# Patient Record
Sex: Female | Born: 1950 | Race: White | Hispanic: No | Marital: Married | Smoking: Never smoker
Health system: Southern US, Community
[De-identification: ages and names within clinical notes are randomized; demographics above are authoritative.]

## PROBLEM LIST (undated history)

## (undated) DIAGNOSIS — F419 Anxiety disorder, unspecified: Secondary | ICD-10-CM

## (undated) DIAGNOSIS — I499 Cardiac arrhythmia, unspecified: Secondary | ICD-10-CM

## (undated) DIAGNOSIS — Z951 Presence of aortocoronary bypass graft: Secondary | ICD-10-CM

## (undated) DIAGNOSIS — N393 Stress incontinence (female) (male): Secondary | ICD-10-CM

## (undated) DIAGNOSIS — J189 Pneumonia, unspecified organism: Secondary | ICD-10-CM

## (undated) DIAGNOSIS — I10 Essential (primary) hypertension: Secondary | ICD-10-CM

## (undated) DIAGNOSIS — F329 Major depressive disorder, single episode, unspecified: Secondary | ICD-10-CM

## (undated) DIAGNOSIS — L0390 Cellulitis, unspecified: Secondary | ICD-10-CM

## (undated) DIAGNOSIS — E1151 Type 2 diabetes mellitus with diabetic peripheral angiopathy without gangrene: Secondary | ICD-10-CM

## (undated) DIAGNOSIS — I4891 Unspecified atrial fibrillation: Secondary | ICD-10-CM

## (undated) DIAGNOSIS — I509 Heart failure, unspecified: Secondary | ICD-10-CM

## (undated) DIAGNOSIS — T82897A Other specified complication of cardiac prosthetic devices, implants and grafts, initial encounter: Secondary | ICD-10-CM

## (undated) DIAGNOSIS — H2510 Age-related nuclear cataract, unspecified eye: Secondary | ICD-10-CM

## (undated) DIAGNOSIS — E11628 Type 2 diabetes mellitus with other skin complications: Secondary | ICD-10-CM

## (undated) DIAGNOSIS — E119 Type 2 diabetes mellitus without complications: Secondary | ICD-10-CM

## (undated) DIAGNOSIS — F32A Depression, unspecified: Secondary | ICD-10-CM

## (undated) DIAGNOSIS — I2510 Atherosclerotic heart disease of native coronary artery without angina pectoris: Secondary | ICD-10-CM

## (undated) DIAGNOSIS — I2699 Other pulmonary embolism without acute cor pulmonale: Secondary | ICD-10-CM

## (undated) DIAGNOSIS — M86679 Other chronic osteomyelitis, unspecified ankle and foot: Secondary | ICD-10-CM

## (undated) DIAGNOSIS — I209 Angina pectoris, unspecified: Secondary | ICD-10-CM

## (undated) DIAGNOSIS — S46009A Unspecified injury of muscle(s) and tendon(s) of the rotator cuff of unspecified shoulder, initial encounter: Secondary | ICD-10-CM

## (undated) DIAGNOSIS — L089 Local infection of the skin and subcutaneous tissue, unspecified: Secondary | ICD-10-CM

## (undated) DIAGNOSIS — T8859XA Other complications of anesthesia, initial encounter: Secondary | ICD-10-CM

## (undated) DIAGNOSIS — H2513 Age-related nuclear cataract, bilateral: Secondary | ICD-10-CM

## (undated) DIAGNOSIS — M1990 Unspecified osteoarthritis, unspecified site: Secondary | ICD-10-CM

## (undated) DIAGNOSIS — D649 Anemia, unspecified: Secondary | ICD-10-CM

## (undated) DIAGNOSIS — E785 Hyperlipidemia, unspecified: Secondary | ICD-10-CM

## (undated) DIAGNOSIS — T4145XA Adverse effect of unspecified anesthetic, initial encounter: Secondary | ICD-10-CM

## (undated) DIAGNOSIS — I739 Peripheral vascular disease, unspecified: Secondary | ICD-10-CM

## (undated) DIAGNOSIS — I5033 Acute on chronic diastolic (congestive) heart failure: Secondary | ICD-10-CM

## (undated) HISTORY — DX: Type 2 diabetes mellitus without complications: E11.9

## (undated) HISTORY — PX: FOOT FRACTURE SURGERY: SHX645

## (undated) HISTORY — DX: Atherosclerotic heart disease of native coronary artery without angina pectoris: I25.10

## (undated) HISTORY — PX: TONSILLECTOMY AND ADENOIDECTOMY: SUR1326

## (undated) HISTORY — DX: Essential (primary) hypertension: I10

## (undated) HISTORY — PX: APPENDECTOMY: SHX54

## (undated) HISTORY — DX: Hyperlipidemia, unspecified: E78.5

## (undated) HISTORY — PX: MANDIBLE FRACTURE SURGERY: SHX706

## (undated) HISTORY — DX: Peripheral vascular disease, unspecified: I73.9

## (undated) HISTORY — PX: OTHER SURGICAL HISTORY: SHX169

## (undated) HISTORY — DX: Acute on chronic diastolic (congestive) heart failure: I50.33

---

## 1997-08-09 HISTORY — PX: OTHER SURGICAL HISTORY: SHX169

## 1997-08-09 HISTORY — PX: WRIST FRACTURE SURGERY: SHX121

## 1998-10-30 ENCOUNTER — Other Ambulatory Visit: Admission: RE | Admit: 1998-10-30 | Discharge: 1998-10-30 | Payer: Self-pay | Admitting: *Deleted

## 1999-04-21 ENCOUNTER — Emergency Department (HOSPITAL_COMMUNITY): Admission: EM | Admit: 1999-04-21 | Discharge: 1999-04-21 | Payer: Self-pay

## 2001-03-28 ENCOUNTER — Ambulatory Visit (HOSPITAL_COMMUNITY): Admission: RE | Admit: 2001-03-28 | Discharge: 2001-03-29 | Payer: Self-pay | Admitting: Cardiology

## 2002-10-26 ENCOUNTER — Ambulatory Visit (HOSPITAL_COMMUNITY): Admission: RE | Admit: 2002-10-26 | Discharge: 2002-10-26 | Payer: Self-pay | Admitting: Cardiology

## 2004-12-30 ENCOUNTER — Ambulatory Visit: Payer: Self-pay

## 2005-01-23 ENCOUNTER — Emergency Department: Payer: Self-pay | Admitting: Emergency Medicine

## 2005-04-07 ENCOUNTER — Ambulatory Visit: Payer: Self-pay | Admitting: Internal Medicine

## 2005-05-04 ENCOUNTER — Ambulatory Visit (HOSPITAL_COMMUNITY): Admission: RE | Admit: 2005-05-04 | Discharge: 2005-05-05 | Payer: Self-pay | Admitting: Cardiology

## 2006-07-05 ENCOUNTER — Ambulatory Visit: Payer: Self-pay | Admitting: Internal Medicine

## 2007-01-04 ENCOUNTER — Emergency Department: Payer: Self-pay | Admitting: Emergency Medicine

## 2007-03-14 ENCOUNTER — Encounter: Payer: Self-pay | Admitting: Orthopaedic Surgery

## 2007-04-10 ENCOUNTER — Encounter: Payer: Self-pay | Admitting: Orthopaedic Surgery

## 2007-05-10 ENCOUNTER — Encounter: Payer: Self-pay | Admitting: Orthopaedic Surgery

## 2007-11-01 ENCOUNTER — Ambulatory Visit: Payer: Self-pay | Admitting: Cardiology

## 2007-11-30 ENCOUNTER — Ambulatory Visit: Payer: Self-pay | Admitting: Internal Medicine

## 2008-04-01 ENCOUNTER — Other Ambulatory Visit: Payer: Self-pay

## 2008-04-01 ENCOUNTER — Ambulatory Visit: Payer: Self-pay | Admitting: Orthopaedic Surgery

## 2008-04-02 ENCOUNTER — Ambulatory Visit: Payer: Self-pay | Admitting: Orthopaedic Surgery

## 2008-10-23 ENCOUNTER — Ambulatory Visit (HOSPITAL_COMMUNITY): Admission: RE | Admit: 2008-10-23 | Discharge: 2008-10-23 | Payer: Self-pay | Admitting: Cardiovascular Disease

## 2009-04-12 ENCOUNTER — Emergency Department: Payer: Self-pay | Admitting: Emergency Medicine

## 2009-04-30 ENCOUNTER — Ambulatory Visit: Payer: Self-pay | Admitting: Internal Medicine

## 2009-05-06 ENCOUNTER — Ambulatory Visit: Payer: Self-pay | Admitting: Physician Assistant

## 2009-06-13 ENCOUNTER — Ambulatory Visit: Payer: Self-pay | Admitting: Otolaryngology

## 2010-11-19 LAB — GLUCOSE, CAPILLARY: Glucose-Capillary: 163 mg/dL — ABNORMAL HIGH (ref 70–99)

## 2010-12-22 NOTE — Cardiovascular Report (Signed)
Dominique, [name]               ACCOUNT NO.:  [id number]   MEDICAL RECORD NO.:  [id number]          PATIENT TYPE:  OIB   LOCATION:  2899                         FACILITY:  [facility]   PHYSICIAN:  Antonieta Iba, MD   DATE OF BIRTH:  1951/03/05   DATE OF PROCEDURE:  10/23/2008  DATE OF DISCHARGE:  10/23/2008                            CARDIAC CATHETERIZATION   PHYSICIAN PERFORMING THE PROCEDURE:  Antonieta Iba, MD   REASON FOR PROCEDURE:  [name] is a very pleasant 60 year old woman  with a history of diabetes, coronary artery disease with stent placed in  her RCA in September 2006 with repeat catheterization in March 2009 by  Dr. Evette Georges recommending medical management of her moderate in-stent  restenosis who presents to the cardiac catheterization lab for repeat  catheterization given continued symptoms of chest pain, jaw pain, and  flushing.   Risks and benefits of the procedure were explained to the patient and  consent was obtained.  The patient was brought to the cardiac  catheterization lab, and prepped and draped in the usual sterile  fashion.  The modified Seldinger technique was used to engage the right  femoral artery and a 5-French Judkins left #4 and right #4 catheter were  used to engage the left main and the RCA respectively.  Hand injection  of contrast was used with cinematography to visualize the coronary  anatomy.  A 5-French pigtail catheter was used to cross the aortic valve  into the LV to obtain an LVgram.  At the end of the case, the catheters  were removed and the sheaths were removed, and hemostasis obtained by  manual pressure.  No complications were reported at the time of this  dictation.   CORONARY ANATOMY:  Left main:  The left main is a moderate-to-large size  vessel that is short and trifurcates into the LAD, ramus, and left  circumflex.  There was no significant disease noted.   Left anterior descending:  The LAD is a moderate-to-large  size vessel  that extends distally around the apex.  There are several small diagonal  branches.  There is a long tubular region of 50% disease in the proximal  region of the LAD.  There is also a moderate region of at least 50 to  possibly 60%, very distal LAD disease that is more focal.  Otherwise,  there is no other significant disease noted.   Left circumflex:  The left circumflex is a moderate-to-large size vessel  that has moderate ostial and proximal disease.  Otherwise, there is no  significant mid-to-distal disease.  There are 1-2 distal obtuse marginal  branches of mild-to-moderate size.   Ramus:  The ramus is a moderate-to-large size vessel that bifurcates in  its mid region.  There is mild 30 to possibly 40% proximal disease that  is long and tapering.   Right coronary artery:  The RCA is a dominant vessel that bifurcates  distally to the PDA and PL branch.  There is a stent placed proximally  extending to the mid region of the vessel.  There is mild-to-moderate in-  stent restenosis estimated of 40-50% at its most that is diffuse with no  focal regions of significant stenosis.  There is a region of mild-to-  moderate stenosis in the mid PD branch.   LVgram shows normal LV systolic function with no focal wall motion  abnormalities.  A pullback of the catheter across the aortic valve shows  no significant aortic gradient.   In summary, [name] has a right dominant coronary system with mild  to possibly moderate in-stent restenosis of the proximal to mid stent in  the RCA.  There is also mild-to-moderate disease noted in the proximal  LAD, proximal ramus, and ostial left circumflex.  60% disease noted in  the distal LAD.  No focal regions amenable to intervention.  We have  recommended medical management and we will try to increase her  nitroglycerin (Imdur), continue her on amlodipine.  We will also try to  add Ranexa as an outpatient to see if this will help her  symptoms of  chronic chest pain.  She certainly may have microvascular disease given  that she is a diabetic.  We will see her back in followup in the clinic  in 1-2 weeks for routine postoperative check.      Antonieta Iba, MD  Electronically Signed     TJG/MEDQ  D:  10/23/2008  T:  10/24/2008  Job:  [id number]   cc:   Dr. Aletha Halim

## 2010-12-25 NOTE — Discharge Summary (Signed)
[place]. [facility]  Patient:    [name], [name] Visit Number: [id number] MRN: [id number]          Service Type: CAT Location: 6500 [id number] Attending Physician:  Eleanora Neighbor Dictated by:   Jennet Maduro Earl Gala, R.N., A.N.P. Adm. Date:  03/28/2001 Disc. Date: 03/29/2001   CC:         Duane Lope. Judithann Sheen, M.D.  Darlin Priestly Lady Gary, M.D.   Discharge Summary  CHIEF COMPLAINT:  Recurrent chest pain despite medical therapy.  SECONDARY DISCHARGE DIAGNOSES: 1. Atherosclerotic cardiovascular disease with known totally occluded right    coronary artery with left to right collaterals with subsequent stent    placement to the right coronary artery with overall satisfactory results    obtained. 2. Obesity. 3. Diabetes. 4. Hypercholesterolemia.  HISTORY OF PRESENT ILLNESS:  [name] is a 60 year old white female who has had angina over the past four months.  She has had previous cardiac catheterization which showed a totally occluded right coronary artery.  She has continued to have chest discomfort despite a reasonable medical management.  Her sine angiograms were reviewed and it was felt that possible revascularization to the right coronary artery could be attempted.  She was subsequently admitted for repeat coronary angiography with possible percutaneous coronary intervention.  Please see the dictated history and physical for further patient presentation and profile.  LABORATORY DATA ON ADMISSION:  Chemistries were satisfactory, except for a glucose of 237 and alkaline phosphatase slightly elevated at 118.  The CBC was normal.  The cholesterol level showed triglycerides of 494.  The total cholesterol was 204.  The PT and PTT were unremarkable.  HOSPITAL COURSE:  The patient was admitted from the short stay in order to undergo elective coronary angiography.  The overall procedure was tolerated well without any known complications.  LV function  demonstrated mild inferior anterior hypokinesis with ejection fraction of 45-50%.  The right coronary was 100% occluded with slight antegrade flow.  A stent was placed to the mid right coronary, as well as to the proximal right coronary with overall satisfactory results obtained.  The left main is normal.  The left circumflex does have minor irregularities and there a scattered 30% irregularities in the intermediate and 30-40% diffuse narrowing in the left anterior descending. Post procedure she was given IV Integrilin and was subsequently transferred to 6500 for further monitoring and evaluation.  Today on March 29, 2001, she is doing well.  She has had no recurrence of chest pain.  The groin has remained unremarkable.  She is felt to be a stable candidate for discharge today.  DISCHARGE CONDITION:  Improved.  DISCHARGE MEDICATIONS:  She will resume all of her home medicines as she was taking before.  Will add Plavix 75 mg a day for the next 21 days.  ACTIVITY:  To be light over the next several days.  DIET:  Low-fat diabetic.  WOUND CARE:  She is to place an ice pack to the groin if needed.  FOLLOW-UP:  Will plan on seeing her back in the office on Tuesday, April 11, 2001, at 10:15 a.m. or sooner if problems arise. Dictated by:   Jennet Maduro Earl Gala, R.N., A.N.P. Attending Physician:  Eleanora Neighbor DD:  03/29/01 TD:  03/29/01 Job: [id number]

## 2010-12-25 NOTE — Cardiovascular Report (Signed)
[place]. [facility]  Patient:    [name], [name] Visit Number: [id number] MRN: [id number]          Service Type: CAT Location: 6500 6529 01 Attending Physician:  Eleanora Neighbor Proc. Date: 03/28/01 Adm. Date:  03/28/2001   CC:         Harold Hedge, M.D., [facility]  Aletha Halim, M.D., [place]   Cardiac Catheterization  REFERRING PHYSICIAN:  Harold Hedge, M.D., [facility] and Dr. Aletha Halim in [place].  INDICATIONS:  The patient is a 60 year old female, with a history of angina and catheterization in June of 2002.  She had totally occluded right coronary artery.  She had left to right collaterals.  Plans were made to follow her medically, but she continued to have symptoms that were unsatisfactory.  Cine were reviewed and she was referred for attempted angioplasty of the right coronary artery.  PROCEDURE:  Left heart catheterization with selective coronary angiography, left ventricular angiography, and angioplasty with stents placed to the right coronary artery sequentially.  TYPE AND SITE OF ENTRY:  Percutaneous right femoral artery.  CATHETERS:  The 6 French 4 curved Judkins right and left coronary catheters, 6 French pigtail ventriculographic catheter, 6 [place] JR4 with side holes, Hi-Torque Floppy guide wire, a 1.5 x 20 mm Maverick balloon, a 2.5 x 20 mm Maverick balloon, a 2.5 x 23 mm Pixel stent, a 2.5 x 13 mm Pixel stent (proximal).  CONTRAST MATERIAL:  Omnipaque.  MEDICATIONS GIVEN PRIOR TO THE PROCEDURE:  Valium 10 mg p.o.  MEDICATIONS GIVEN DURING THE PROCEDURE:  Versed 4 mg IV, Integrilin, heparin.  COMMENTS:  The patient tolerated the procedure well.  HEMODYNAMIC DATA:  The aortic pressure was 169/83,  LV is 162/23.  There was no aortic valve gradient noted on pullback.  ANGIOGRAPHIC DATA: 1. Left main coronary artery:  Normal. 2. Left circumflex:  The left circumflex is moderate in size, posterolateral  branch.  It has irregularities with no significant focal disease. 3. Intermediate:  The intermediate rises and is a moderate sized vessel.  It    has 30-40% somewhat diffuse narrowings proximally. 4. Left anterior descending:  The left anterior descending is moderately    large vessel.  It has irregularities of 30-40% nature.  It has no    significant obstructive disease. 5. Right coronary artery:  The right coronary artery has a 99+ percent    stenosis and the midportion is functionally occluded.  There are left to    right collateral distally.  It is somewhat diffusely diseased proximally.  LEFT VENTRICULOGRAPHY:  Left ventricular angiogram was performed in the RAO position.  Overall cardiac size and silhouette were normal.  There was mild inferior hypokinesis as well as mild anterior hypokinesis with global ejection fraction estimated to be 45-50%.  There is no mitral regurgitation, intracardiac calcification or intracavitary filling defect.  INTERVENTION:  We changed guide catheters to a JR4 guide with side holes.  It was a 6 Jamaica catheter that supplied adequate backup.  Initially we used a Hi-Torque Floppy guide wire but did not really have enough stiffness to cross the lesions.  We then returned with a Cross-It wire and were able to cannulate distally but were in a branch.  We used a 1.5 Maverick balloon dilated proximally then returned and were able to pass the guide wire into the distal vessel.  We then returned with a 2.5 Maverick balloon; it was 20 mm in length, and dilated across what had been the total  occlusion.  Next, we placed the 23 x 2.5 mm Pixel stent.  This was inflated to a maximum of 12 atmospheres. The placement was satisfactory.  We then returned with a 2.5 x 13 mm Pixel stent.  This was positioned proximally across what appeared to be a 70% narrowing.  We left approximately 10 mm of relatively normal vessel between the stents.  The proximal stent was inflated to  14 atmospheres maximally.  The final angiographic result was excellent and there really was no residual stenosis present and excellent distal flow.  OVERALL IMPRESSION: 1. Essentially totally occluded right coronary artery with left to right    collaterals with successful stent placement in the mid right coronary    artery and proximal right coronary artery. 2. Mildly reduced left ventricular function. 3. Mild coronary atherosclerosis in the left system.  DISCUSSION:  We will try aggressively to have the patient manage her cardiovascular risk factors.  She certainly would be at increased risk for re-stenosis with the small vessels and the length of the stents, but hopefully this can be managed and we can avoid surgery at her young age, and her station in life. Attending Physician:  Eleanora Neighbor DD:  03/28/01 TD:  03/28/01 Job: [id number]

## 2010-12-25 NOTE — Discharge Summary (Signed)
[name], [name]               ACCOUNT NO.:  [id number]   MEDICAL RECORD NO.:  [id number]          PATIENT TYPE:  OIB   LOCATION:  6525                         FACILITY:  [facility]   PHYSICIAN:  Colleen Can. Deborah Chalk, M.D.DATE OF BIRTH:  Dec 21, 1950   DATE OF ADMISSION:  05/04/2005  DATE OF DISCHARGE:                                 DISCHARGE SUMMARY   CHIEF COMPLAINT:  Chest pain with subsequent elective cardiac  catheterization with angioplasty and stent placement to the right coronary  artery.   CATHETERIZATION FINDINGS:  LV function shows mild inferior hypokinesia.  Ejection fraction is 50-60%.  The left main coronary is normal. The LAD has  segmental 50% narrowing.  The intermediate has segmental 50% narrowing.  There are scattered 40-50% narrowing in the left circumflex.  The right  coronary artery at the previously stented segmental section shows a 95%  focal lesion after the acute margin.  Subsequent angioplasty and stent  placement was performed with a 2.5 x 13 mm CYPHER stent with a 0% residual  result obtained.  There was slow flow in the right coronary with resolution  with IV nitroglycerin, Fentanyl and Lopressor for chest pain was given.   SECONDARY DISCHARGE DIAGNOSES:  1.  Extensive atherosclerotic cardiovascular disease, previous stent      placements x2 to the right coronary in August of 2002.  2.  Uncontrolled insulin-dependent diabetes, currently with hemoglobin A1c      of 11.2.  3.  Hypertension currently uncontrolled with recent addition of Micardis.  4.  ACE intolerance secondary to cough.  5.  Hypothyroidism.  6.  Hyperlipidemia.  7.  Obesity.  8.  Longstanding situational stress.   HISTORY OF PRESENT ILLNESS:  The patient is a pleasant, 60 year old obese  white female.  She has multiple medical problems which include uncontrolled  insulin-dependent diabetes, hyperlipidemia and uncontrolled hypertension; as  well as known extensive ishemic heart disease.   She presents after having  recurrence of chest pain.  She has had a previous Cardiolite study in  [place] which showed no evidence of ischemia.  She has continued to use  more nitroglycerin over the past several weeks and subsequently was seen in  our office and referred for repeat catheterization.   Please see the dictated history and physical for further patient  presentation and profile.   LABORATORY DATA:  On admission PT and PTT were unremarkable.  CBC was normal  except for a white count of 11.6.  Chemistries showed a BUN of 16,  creatinine 0.8, glucose 431, sodium 138, potassium 4.4.  Her alkaline  phosphatase is elevated at 201.  LFTs are normal.  Cholesterol level shows  total cholesterol 187, triglycerides 142, HDL 50 and LDL of 109, hemoglobin  A1c is 11.2.   HOSPITAL COURSE:  The patient was admitted electively in order to undergo  cardiac catheterization.  That procedure was basically tolerated well.  She  did have chest pain that was treated with IV nitroglycerin, Fentanyl and  Lopressor.  Angioplasty and stent placement was performed to the right  coronary and overall a very satisfactory result  was obtained.  Post  procedure she was transferred to 6500 and today, on 05/05/2005, she is doing  well without complaints.  She has had no further episodes of chest pain.  Her groin has remained stable, and she is felt to be a stable candidate for  discharge today.  Cardiac enzymes are negative x2.   DISCHARGE CONDITION:  Stable.   DISCHARGE MEDICINES:  1.  Imdur 30 mg a day.  2.  Lasix 20 mg p.r.n.  3.  A baby aspirin a day.  4.  Lipitor 10 mg a day.  5.  Atenolol 100 mg a day.  6.  Lantus insulin 40 units at bedtime.  7.  Micardis 40 mg a day.  These are all as she was taking before.  1.  We will be adding Plavix 75 mg daily indefinitely.   We will plan on seeing her back in the office in 1-2 weeks.  She is asked to  call to schedule that appointment we will have  her see Dr. Judithann Sheen for  management of her uncontrolled diabetes this week.  She is to call if any  problems arise in the interim.      Sharlee Blew, N.P.      Colleen Can. Deborah Chalk, M.D.  Electronically Signed    LC/MEDQ  D:  05/05/2005  T:  05/05/2005  Job:  [id number]   cc:   Aletha Halim, M.D.
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
## 2010-12-25 NOTE — Cardiovascular Report (Signed)
[name], [name]               ACCOUNT NO.:  [id number]   MEDICAL RECORD NO.:  [id number]          PATIENT TYPE:  OIB   LOCATION:  2874                         FACILITY:  [facility]   PHYSICIAN:  Colleen Can. Deborah Chalk, M.D.DATE OF BIRTH:  1950-12-24   DATE OF PROCEDURE:  05/04/2005  DATE OF DISCHARGE:                              CARDIAC CATHETERIZATION   REASON FOR CATHETERIZATION:  [name] is referred for catheterization for  evaluation of progressive angina. She has had previous stents to the right  coronary artery. She has poorly controlled diabetes mellitus.   PROCEDURE:  Left heart catheterization with selective coronary angiography,  left ventricular angiography, and stent placement in the right coronary  artery.   TYPE/SITE OF ENTRY:  Percutaneous right femoral artery with Angio-Seal.   CATHETERS:  1.  A #6 French 4 curved Judkins right and left coronary catheters.  2.  A #6 French pigtail ventricular ___________ catheter.  3.  Angioplasty was performed with a 6 [place] JR-4 guide with side holes, a      probe water guidewire, a 2.5 x 8 mm Voyager balloon, and subsequently, a      2.5 x 13 mm Cypher stent.   CONTRAST MATERIAL:  Omnipaque.   MEDICATIONS DURING PROCEDURE:  Initially the patient received Versed. As  anticoagulation for the angioplasty, we used heparin and Integralin. He had  intravenous nitroglycerin, all during the elective part of the procedure.  After the angioplasty, she initially had spasm of the right coronary artery  and we used intracoronary nitroglycerin on one occasion with complete  resolution of the spasm. At that point in time, we increased intravenous  nitroglycerin to a maximum of 100 mcg per minute. Also given were Lopressor  5 mg IV x2, Fentanyl 100 mcg IV, and Phenergan 12.5 mg IV. At the time of  leaving the catheterization lab, the chest pain has basically resolved and  was listed by the patient as a 2 over 10.   HEMODYNAMIC DATA:   Aortic pressure was 145/89. Left ventricular was 157/3-4.  There was no aortic valve gradient on pull-back.   ANGIOGRAPHIC DATA:  1.  Left main coronary artery was normal.  2.  Left circumflex:  The left circumflex was a small to moderate size      vessel that continues primarily as an obtuse marginal. There is somewhat      diffuse disease that would be segmentally noted to be 50% and had      luminal narrowing.  3.  Intermediate coronary:  There is an intermediate coronary artery with      segmental 40% to 50% narrowing.  4.  Left anterior descending:  The left anterior descending is a moderately      large vessel. There is 50% segmental left anterior descending stenosis      present.  5.  Right coronary artery:  The right coronary artery is a dominant vessel.      Stents are present in the proximal right coronary artery in a somewhat      diffuse manner but with persistent patency of the vessel. Just  beyond      the acute margin, but prior to the crux, there is a 90% focal stenosis      present. Posterior descending at this level has 60% ostial narrowing.      The continuation branch has irregularities but is free of obstructive      disease.  6.  The left ventricular angiogram is performed in the RAO position. The      overall cardiac size and silhouette were normal. There is very minimal      inferior hypokinesis but the ejection fraction is felt to be in the 55%      to 60% range.   ANGIOPLASTY PROCEDURE:  We initially used the JL-4 guide with side holes.  The probe water guidewire was passed easily across the stenosis. We pre-  dilated with a 2.5 Voyager balloon. We then placed a 2.5 x 13 mm Cipher  stent across the stenosis. Satisfactory inflations were obtained and  excellent position of the stent as well as normalization of the luminal  diameter. This stent was inflated to a maximum of 16 atmospheres. After the  stents were moved and at the time we were beginning to do  Angio-Seal, the  patient developed substernal chest pain. She did have slight ST elevation.  Angiogram showed slow flow in the entire right coronary artery. The main  body of the right coronary artery had adequate flow in the continuation  branch but posterior descending vessel and proximal acute marginal had slow  flow. At that point, intra-coronary nitroglycerin was given and the followup  shot, at that point in time, showed excellent flow throughout. We had  persistence of excellent flow throughout the vessel for the remainder of the  time in the catheterization lab. The patient then developed substernal chest  pain without EKG changes. Angiograms were taken in multiple views and failed  to show any severe obstruction, although she had persistence of the 60% to  70% stenosis at the ostium of the posterior descending. She was watched for  approximately 30 minutes and given multiple agents as listed above and with  that, had resolution of pain, persistence of patency, and it is felt that it  was a satisfactory, excellent angioplasty result.   OVERALL IMPRESSION:  1.  Moderately diffuse coronary atherosclerosis involving all 3 vessels with      a severe focal stenosis in the right coronary artery.  2.  Severe stenosis in the distal right coronary artery, prior to the crux,      with successful stent placement.  3.  Well preserved global left ventricular function.      Colleen Can. Deborah Chalk, M.D.  Electronically Signed     SNT/MEDQ  D:  05/04/2005  T:  05/05/2005  Job:  [id number]   cc:   Dr. Judithann Sheen

## 2010-12-25 NOTE — Cardiovascular Report (Signed)
[name], [name]                           ACCOUNT NO.:  [id number]   MEDICAL RECORD NO.:  [id number]                   PATIENT TYPE:  OIB   LOCATION:  2899                                 FACILITY:  [facility]   PHYSICIAN:  Colleen Can. Deborah Chalk, M.D.            DATE OF BIRTH:  Jul 06, 1951   DATE OF PROCEDURE:  10/26/2002  DATE OF DISCHARGE:                              CARDIAC CATHETERIZATION   HISTORY:  The patient is a 60 year old female with previous stents to the  right coronary artery in 2002.  She presents with recurrent angina, but in  the setting of extreme emotional stress.  Her other problems include  diabetes, hypercholesterolemia and obesity.   PROCEDURE:  Left heart catheterization with selective coronary angiography,  with left ventricular angiography with Perclose.   TYPE AND SITE OF ENTRY:  Percutaneous; right femoral artery.   CATHETER:  The 6-French four-curved Judkins right and left coronary  catheters, 6-French pigtail ventriculography catheter.   CONTRAST MATERIAL:  Omnipaque.   MEDICATIONS GIVEN PRIOR TO PROCEDURE:  Valium 10 mg p.o.   MEDICATIONS GIVEN DURING PROCEDURE:  Versed 3 mg IV.   COMMENTS:  Patient tolerated the procedure well.   HEMODYNAMIC DATA:  The aortic pressure was 130/74.  LV was 136/22.  There  was no aortic valve gradient noted on pullback.   ANGIOGRAPHIC DATA:  Left ventricular angiogram was performed in the RAO  position.  Overall cardiac size and silhouette were normal.  There was very  mild inferobasilar hypokinesis with a global ejection fraction of 55%.  There was no mitral regurgitation, intracardiac calcification or  intracavitary filling defect.   CORONARY ARTERIES:  The coronary arteries arise and distribute normally.  In  general, there are diffuse irregularities throughout the coronary tree.   1. Left main coronary artery is normal.  2. Left circumflex:  The left circumflex has diffuse irregularities.  There     is a  30-40% narrowing distally that may be somewhat more prominent than     the other irregularities.  3. Intermediate coronary artery has diffuse irregularities without focal     obstructive disease.  4. Left anterior descending:  The left anterior descending has diffuse     atherosclerotic.  Approximately 2 cm prior to crossing the apex, there is     a 90% somewhat focal stenosis.  It is a tortuous segment and does not     really lend itself to angioplasty and it is quite small, in the 2-mm     range, at this distal portion at the vessel.  5. Right coronary artery:  The right coronary artery has stents present.     They are widely patent.  There are diffuse irregularities throughout the     right coronary artery without significant focal obstructive disease.    OVERALL IMPRESSION:  1. Well-preserved left ventricular function with mild inferobasilar     hypokinesis.  2.  Diffuse irregularities throughout the coronary tree with a focal 90%     narrowing in the distal left anterior descending just as it crosses the     apex in a very terminal location.   DISCUSSION:  I think we need to try to manage the patient medically.  Clearly, the stents are patent and she has dramatically more flow in her  coronary anatomy than she did in 2002, but we will need to continue to work  on modifying cardiovascular risk factors.                                                Colleen Can. Deborah Chalk, M.D.    SNT/MEDQ  D:  10/26/2002  T:  10/27/2002  Job:  [id number]   cc:   [place], [place], [place].D.

## 2011-01-13 ENCOUNTER — Ambulatory Visit: Payer: Medicare Other | Admitting: Internal Medicine

## 2011-01-14 ENCOUNTER — Other Ambulatory Visit: Payer: Self-pay | Admitting: Internal Medicine

## 2011-01-14 DIAGNOSIS — Z712 Person consulting for explanation of examination or test findings: Secondary | ICD-10-CM

## 2011-01-19 ENCOUNTER — Ambulatory Visit
Admission: RE | Admit: 2011-01-19 | Discharge: 2011-01-19 | Disposition: A | Discharge status: Home / Self Care | Payer: Medicare Other | Source: Ambulatory Visit | Attending: Family Medicine | Admitting: Family Medicine

## 2011-01-19 ENCOUNTER — Other Ambulatory Visit: Payer: Self-pay | Admitting: Family Medicine

## 2011-01-19 ENCOUNTER — Other Ambulatory Visit: Payer: Self-pay | Admitting: Internal Medicine

## 2011-01-19 DIAGNOSIS — Z712 Person consulting for explanation of examination or test findings: Secondary | ICD-10-CM

## 2011-01-19 DIAGNOSIS — R928 Other abnormal and inconclusive findings on diagnostic imaging of breast: Secondary | ICD-10-CM

## 2011-01-20 ENCOUNTER — Ambulatory Visit
Admission: RE | Admit: 2011-01-20 | Discharge: 2011-01-20 | Disposition: A | Discharge status: Home / Self Care | Payer: Medicare Other | Source: Ambulatory Visit | Attending: Family Medicine | Admitting: Family Medicine

## 2011-01-20 ENCOUNTER — Other Ambulatory Visit: Payer: Self-pay | Admitting: Internal Medicine

## 2011-01-20 ENCOUNTER — Other Ambulatory Visit: Payer: Self-pay | Admitting: Family Medicine

## 2011-01-20 DIAGNOSIS — R928 Other abnormal and inconclusive findings on diagnostic imaging of breast: Secondary | ICD-10-CM

## 2011-02-23 ENCOUNTER — Telehealth: Payer: Self-pay | Admitting: Cardiovascular Disease

## 2011-02-23 ENCOUNTER — Ambulatory Visit: Payer: Medicare Other | Admitting: Anesthesiology

## 2011-02-23 NOTE — Telephone Encounter (Signed)
error 

## 2011-02-24 ENCOUNTER — Encounter: Payer: Self-pay | Admitting: *Deleted

## 2011-02-24 ENCOUNTER — Encounter: Payer: Self-pay | Admitting: Cardiovascular Disease

## 2011-02-24 ENCOUNTER — Ambulatory Visit (INDEPENDENT_AMBULATORY_CARE_PROVIDER_SITE_OTHER): Payer: Medicare Other | Admitting: Cardiovascular Disease

## 2011-02-24 DIAGNOSIS — Z01818 Encounter for other preprocedural examination: Secondary | ICD-10-CM

## 2011-02-24 DIAGNOSIS — E119 Type 2 diabetes mellitus without complications: Secondary | ICD-10-CM | POA: Insufficient documentation

## 2011-02-24 DIAGNOSIS — I2510 Atherosclerotic heart disease of native coronary artery without angina pectoris: Secondary | ICD-10-CM | POA: Insufficient documentation

## 2011-02-24 NOTE — Patient Instructions (Signed)
You are doing well. No medication changes were made. Please call us if you have new issues that need to be addressed.  Please follow up in clinic as needed.

## 2011-02-24 NOTE — Progress Notes (Signed)
[name] Date of Birth  01-Jan-1951 [facility] / [facility] [address]     [address] [place], [place]  [address] [phone]  Fax  [phone]  History of Present Illness:  Pt has a hx of diabetes, HTN, and hyperlipidemia. Hx of A-Fib in the past.  Took Fen-Phen in the past.  Walks 1-3 miles a day.  Watching fat intake.  Hx of coronary stents 10 years ago.    Lots of stress with family issues.  No chest pain or dyspnea.    Current Outpatient Prescriptions  Medication Sig Dispense Refill  . amLODipine (NORVASC) 10 MG tablet Take 10 mg by mouth daily.        Marland Kitchen aspirin 325 MG tablet Take 325 mg by mouth daily.        Marland Kitchen atenolol (TENORMIN) 100 MG tablet Take 100 mg by mouth daily.        Marland Kitchen atorvastatin (LIPITOR) 10 MG tablet Take 10 mg by mouth daily.        Marland Kitchen buPROPion (WELLBUTRIN XL) 300 MG 24 hr tablet Take 300 mg by mouth daily.        . clopidogrel (PLAVIX) 75 MG tablet Take 75 mg by mouth daily.        . furosemide (LASIX) 20 MG tablet Take 20 mg by mouth daily.        . isosorbide mononitrate (IMDUR) 60 MG 24 hr tablet Take 60 mg by mouth daily.        . sitaGLIPtin (JANUVIA) 100 MG tablet Take 100 mg by mouth daily.        Marland Kitchen zolpidem (AMBIEN) 10 MG tablet Take 10 mg by mouth at bedtime as needed.           Allergies  Allergen Reactions  . Codeine   . Oxycontin     Palpitations with difficulty breathing.    Past Medical History  Diagnosis Date  . Diabetes mellitus   . Hyperlipidemia   . Hypertension     Past Surgical History  Procedure Date  . Tonsillectomy and adenoidectomy   . Cesarean section     History  Smoking status  . Never Smoker   Smokeless tobacco  . Not on file    History  Alcohol Use No    Family History  Problem Relation Age of Onset  . Hypertension Mother     Reviw of Systems:  Reviewed in the HPI.  All other systems are negative.  Physical Exam: BP 132/75  Pulse 81  Ht 5\' 8"  (1.727 m)  Wt  190 lb (86.183 kg)  BMI 28.89 kg/m2 The patient is alert and oriented x 3.  The mood and affect are normal.   Skin: warm and dry.  Color is normal.    HEENT:   the sclera are nonicteric.  The mucous membranes are moist.  The carotids are 2+ without bruits.  There is no thyromegaly.  There is no JVD.    Lungs: clear.  The chest wall is non tender.    Heart: regular rate with a normal S1 and S2.  There are no murmurs, gallops, or rubs. The PMI is not displaced.     Abdomin: good bowel sounds.  There is no guarding or rebound.  There is no hepatosplenomegaly or tenderness.  There are no masses.   Extremities:  no clubbing, cyanosis, or edema.  The legs are without rashes.  The distal pulses are intact.   Neuro:  Cranial nerves II - XII are intact.  Motor and sensory functions are intact.    The gait is normal.  ECG: NSR.  Normal ECG.  Assessment / Plan:

## 2011-02-24 NOTE — Assessment & Plan Note (Signed)
She's started to watch her intake of carbohydrates.

## 2011-02-24 NOTE — Assessment & Plan Note (Addendum)
She is stable from a cardiac standpoint.   She has not had any episodes of chest pain or shortness of breath. She is a history of coronary stenting. It will be okay for a hold her Plavix for up to 7 days. I would like her to continue the aspirin. She is at low risk for any cardiovascular complication.

## 2011-02-24 NOTE — Assessment & Plan Note (Signed)
She is very stable. He'll follow with Dr. Mariah Milling at her regularly scheduled appointment

## 2011-04-02 ENCOUNTER — Ambulatory Visit: Payer: Medicare Other | Admitting: Podiatry

## 2011-04-05 LAB — PATHOLOGY REPORT

## 2011-04-09 ENCOUNTER — Inpatient Hospital Stay: Payer: Medicare Other | Admitting: Podiatry

## 2011-04-21 ENCOUNTER — Inpatient Hospital Stay: Payer: Medicare Other | Admitting: Podiatry

## 2011-04-26 LAB — PATHOLOGY REPORT

## 2011-06-14 ENCOUNTER — Ambulatory Visit: Payer: Medicare Other | Admitting: Rheumatology

## 2011-08-10 HISTORY — PX: CORONARY ANGIOPLASTY WITH STENT PLACEMENT: SHX49

## 2011-08-10 HISTORY — PX: OTHER SURGICAL HISTORY: SHX169

## 2011-08-16 ENCOUNTER — Other Ambulatory Visit: Payer: Self-pay | Admitting: Internal Medicine

## 2011-08-16 DIAGNOSIS — R922 Inconclusive mammogram: Secondary | ICD-10-CM

## 2011-08-18 ENCOUNTER — Ambulatory Visit: Payer: Self-pay | Admitting: Otolaryngology

## 2011-08-24 ENCOUNTER — Ambulatory Visit
Admission: RE | Admit: 2011-08-24 | Discharge: 2011-08-24 | Disposition: A | Discharge status: Home / Self Care | Payer: Medicare Other | Source: Ambulatory Visit | Attending: *Deleted | Admitting: *Deleted

## 2011-08-24 DIAGNOSIS — R922 Inconclusive mammogram: Secondary | ICD-10-CM

## 2011-11-30 ENCOUNTER — Other Ambulatory Visit: Payer: Self-pay | Admitting: Sports Medicine

## 2011-11-30 DIAGNOSIS — IMO0002 Reserved for concepts with insufficient information to code with codable children: Secondary | ICD-10-CM

## 2011-12-01 ENCOUNTER — Other Ambulatory Visit: Payer: Self-pay | Admitting: Sports Medicine

## 2011-12-01 DIAGNOSIS — IMO0002 Reserved for concepts with insufficient information to code with codable children: Secondary | ICD-10-CM

## 2011-12-03 ENCOUNTER — Ambulatory Visit
Admission: RE | Admit: 2011-12-03 | Discharge: 2011-12-03 | Disposition: A | Discharge status: Home / Self Care | Payer: Medicare Other | Source: Ambulatory Visit | Attending: Sports Medicine | Admitting: Sports Medicine

## 2011-12-03 DIAGNOSIS — IMO0002 Reserved for concepts with insufficient information to code with codable children: Secondary | ICD-10-CM

## 2011-12-03 NOTE — Progress Notes (Signed)
Patient ID: [name], female   DOB: 06-09-1951, 61 y.o.   MRN: [id number] Office/Outpatient New Patient - Level II 40981 12/03/2011 15:39:00  Referring physician: Dr. Farris Has  Reason for Consult/Chief Complaint: Low back pain. L3 compression fracture.  History of Present Illness: [name] is a very pleasant 61 year old woman who sustained a fall 317/2013. She has had severe low back pain since that time. The pain is localized to the low back without significant radiation into the extremities. She states the pain is slightly better when she is in a flexed position. She denies any bowel or bladder symptoms. She rates the pain has 10/10. A Roland-Morris disability questionnaire was administered. She scored 15/24. She states the pain has been consistent since the initial trauma. It is not improving or getting worse. She does have some relief from the pain with Norco 10/325. She is also using diclofenac. She would like to reduce her need for pain medication.  Medications: 1. Norco 10/325, 1 every 6-8 hours. 2. Diclofenac, 1-2 every 4-6 hours. 3. Plavix 75 mg, once daily 4. Atenolol 100 mg, once daily 5. Isosorbide 30 mg, once daily 6. Lasix 20 mg as needed. 7. Insulin 20 +24 mg subcutaneous once daily 8. Crestor or 20 mg once daily  Allergies: Biaxin and Actos  Past Medical History: 1. Coronary artery disease status post multiple coronary stents. 2. Hypertension. 3. Hypercholesterolemia. 4. Diabetes. 5. Hypothyroidism.  Surgical History: 1. Coronary artery stents 2. Right rotator cuff repair. 3. Multiple left foot surgeries. 4. Mandible surgeries. 5. C-sections  Social History: The patient denies use of tobacco or alcohol. She is married and lives with her husband and two daughters.  Review of Systems: The patient states she has had some weight loss. She denies any cardiovascular, respiratory, gastrointestinal, musculoskeletal, skin, neurologic, psychiatric, or  other endocrine symptoms.  Exam:  Vitals: Blood pressure 164/76. Pulse 59. Temperature 98.2 degrees.  Data Review: I have reviewed the patient's MRI from [facility]. This demonstrates 08/02: Compression fracture at L3 with significant retropulsion of bone. Moderate to severe central canal stenosis results. The left side of the canal is narrowed to 2 mm. Foraminal narrowing is greater right than left.  IMPRESSIONS:  L3 compression fracture with moderate to severe central canal stenosis with particular narrowing of the left side of the canal. The compression fracture is symptomatic with unrelenting pain that is not improving. The patient has some relief with pain medications.  PLAN:  Due to the significant compression of the spinal canal, I am not comfortable performing vertebroplasty or kyphoplasty this patient. The internal pressure generated within the vertebral body could result in slight retropulsion of the existing bone. Given the minimal margin for error and potential consequences of new leg pain which may be severe, weakness, loss of sensation, or paralysis, the risks outweigh the benefits of spinal augmentation for this patient.  If the patient can tolerate external bracing, allowing the fracture to heal completely without intervention would be ideal.  I have advised the patient to seek surgical consultation should she develop any lower extremity symptoms or changes in bowel or bladder habits that not attributable to medications. Should her symptoms progress or not heal, surgical intervention may be necessary.  I answered all questions. I spent greater than 20 minutes of time with the patient and her husband.  Per CMS PQRS reporting requirements (PQRS Measure 24): Given the patient's age of greater than 50 and the fracture site (hip, distal radius, or spine), the patient  should be tested for osteoporosis using DXA, and the appropriate treatment considered based  on the DXA results.

## 2012-04-26 ENCOUNTER — Ambulatory Visit: Payer: Medicare Other | Admitting: Cardiovascular Disease

## 2012-05-11 ENCOUNTER — Encounter: Payer: Self-pay | Admitting: Cardiovascular Disease

## 2012-07-04 ENCOUNTER — Other Ambulatory Visit: Payer: Self-pay | Admitting: Internal Medicine

## 2012-07-04 DIAGNOSIS — Z1231 Encounter for screening mammogram for malignant neoplasm of breast: Secondary | ICD-10-CM

## 2012-08-14 ENCOUNTER — Ambulatory Visit
Admission: RE | Admit: 2012-08-14 | Discharge: 2012-08-14 | Disposition: A | Discharge status: Home / Self Care | Payer: Medicare Other | Source: Ambulatory Visit | Attending: Internal Medicine | Admitting: Internal Medicine

## 2012-08-14 DIAGNOSIS — Z1231 Encounter for screening mammogram for malignant neoplasm of breast: Secondary | ICD-10-CM

## 2012-11-27 ENCOUNTER — Encounter: Payer: Self-pay | Admitting: Pulmonary Disease

## 2012-11-28 ENCOUNTER — Institutional Professional Consult (permissible substitution): Payer: Medicare Other | Admitting: Pulmonary Disease

## 2013-01-30 ENCOUNTER — Ambulatory Visit: Payer: Self-pay | Admitting: Vascular Surgery

## 2013-01-30 HISTORY — PX: TRANSLUMINAL ANGIOPLASTY: SHX274

## 2013-01-30 HISTORY — PX: TRANSLUMINAL ATHERECTOMY TIBIAL ARTERY: SHX2570

## 2013-01-30 LAB — CREATININE, SERUM
Creatinine: 1.39 mg/dL — ABNORMAL HIGH (ref 0.60–1.30)
EGFR (African American): 47 — ABNORMAL LOW

## 2013-02-23 DIAGNOSIS — S91309A Unspecified open wound, unspecified foot, initial encounter: Secondary | ICD-10-CM | POA: Insufficient documentation

## 2013-03-07 ENCOUNTER — Encounter (HOSPITAL_BASED_OUTPATIENT_CLINIC_OR_DEPARTMENT_OTHER): Payer: Medicare Other | Attending: General Surgery

## 2013-03-07 DIAGNOSIS — Z79899 Other long term (current) drug therapy: Secondary | ICD-10-CM | POA: Insufficient documentation

## 2013-03-07 DIAGNOSIS — L84 Corns and callosities: Secondary | ICD-10-CM | POA: Insufficient documentation

## 2013-03-07 DIAGNOSIS — I739 Peripheral vascular disease, unspecified: Secondary | ICD-10-CM | POA: Insufficient documentation

## 2013-03-07 DIAGNOSIS — I10 Essential (primary) hypertension: Secondary | ICD-10-CM | POA: Insufficient documentation

## 2013-03-07 DIAGNOSIS — I2510 Atherosclerotic heart disease of native coronary artery without angina pectoris: Secondary | ICD-10-CM | POA: Insufficient documentation

## 2013-03-07 DIAGNOSIS — E1169 Type 2 diabetes mellitus with other specified complication: Secondary | ICD-10-CM | POA: Insufficient documentation

## 2013-03-07 DIAGNOSIS — L97509 Non-pressure chronic ulcer of other part of unspecified foot with unspecified severity: Secondary | ICD-10-CM | POA: Insufficient documentation

## 2013-03-08 NOTE — Progress Notes (Signed)
[facility]  NAME:  [name], [name]               ACCOUNT NO.:  [id number]  MEDICAL RECORD NO.:  [id number]      DATE OF BIRTH:  16-Nov-1950  PHYSICIAN:  Ardath Sax, M.D.           VISIT DATE:                                  OFFICE VISIT   HISTORY OF PRESENT ILLNESS:  This is a 62 year old, Caucasian female who is a type 2 diabetic and has had problems with peripheral vascular disease and also coronary artery disease.  She is on atenolol, Januvia, Imdur, Plavix, aspirin, Ambien.  She was also on Cipro and Augmentin. Her problem is that she has a diabetic foot ulcer on the plantar aspect of her left foot.  It is about a cm in diameter.  This all began when she was trying to break up some dogs attacking a cat and she was barefooted on the lawn and somehow stepped on a piece of glass.  She had the glass removed by a doctor, and he put her on antibiotics and since that time, she has undergone an angioplasty of her superficial femoral artery.  This has increased her blood flow.  In fact, she has a palpable pulse.  Today, when I saw this, I debrided it of some callus and managed to get a rongeur in the depth of the wound and clean it somewhat, although it was very tender and difficult to do.  Today, I elected to start her on Santyl and I wrote her a prescription and we will make plans hopefully to offload this in a EZ cast.  We will also consider using hyperbaric oxygen therapy.  DIAGNOSIS:  Diabetic foot ulcer started by trauma left foot, type 2 diabetes, hypertension, coronary artery disease, peripheral vascular disease.     Ardath Sax, M.D.     PP/MEDQ  D:  03/07/2013  T:  03/08/2013  Job:  [id number]

## 2013-03-14 ENCOUNTER — Encounter (HOSPITAL_BASED_OUTPATIENT_CLINIC_OR_DEPARTMENT_OTHER): Payer: Medicare Other | Attending: General Surgery

## 2013-03-14 DIAGNOSIS — E1169 Type 2 diabetes mellitus with other specified complication: Secondary | ICD-10-CM | POA: Insufficient documentation

## 2013-03-14 DIAGNOSIS — L97509 Non-pressure chronic ulcer of other part of unspecified foot with unspecified severity: Secondary | ICD-10-CM | POA: Insufficient documentation

## 2013-04-11 ENCOUNTER — Encounter (HOSPITAL_BASED_OUTPATIENT_CLINIC_OR_DEPARTMENT_OTHER): Payer: Medicare Other | Attending: General Surgery

## 2013-04-11 DIAGNOSIS — L97509 Non-pressure chronic ulcer of other part of unspecified foot with unspecified severity: Secondary | ICD-10-CM | POA: Insufficient documentation

## 2013-04-11 DIAGNOSIS — E1169 Type 2 diabetes mellitus with other specified complication: Secondary | ICD-10-CM | POA: Insufficient documentation

## 2013-04-26 ENCOUNTER — Ambulatory Visit: Payer: Medicare Other | Admitting: Physician Assistant

## 2013-04-27 ENCOUNTER — Encounter: Payer: Self-pay | Admitting: Physician Assistant

## 2013-04-27 ENCOUNTER — Ambulatory Visit (INDEPENDENT_AMBULATORY_CARE_PROVIDER_SITE_OTHER): Payer: Medicare Other | Admitting: Physician Assistant

## 2013-04-27 VITALS — BP 124/76 | HR 74 | Ht 68.5 in | Wt 159.0 lb

## 2013-04-27 DIAGNOSIS — E785 Hyperlipidemia, unspecified: Secondary | ICD-10-CM

## 2013-04-27 DIAGNOSIS — Z5181 Encounter for therapeutic drug level monitoring: Secondary | ICD-10-CM

## 2013-04-27 DIAGNOSIS — I480 Paroxysmal atrial fibrillation: Secondary | ICD-10-CM | POA: Insufficient documentation

## 2013-04-27 DIAGNOSIS — Z01818 Encounter for other preprocedural examination: Secondary | ICD-10-CM

## 2013-04-27 DIAGNOSIS — I2510 Atherosclerotic heart disease of native coronary artery without angina pectoris: Secondary | ICD-10-CM

## 2013-04-27 DIAGNOSIS — I739 Peripheral vascular disease, unspecified: Secondary | ICD-10-CM

## 2013-04-27 DIAGNOSIS — I4891 Unspecified atrial fibrillation: Secondary | ICD-10-CM

## 2013-04-27 DIAGNOSIS — E119 Type 2 diabetes mellitus without complications: Secondary | ICD-10-CM

## 2013-04-27 DIAGNOSIS — I10 Essential (primary) hypertension: Secondary | ICD-10-CM

## 2013-04-27 MED ORDER — NITROGLYCERIN 0.4 MG SL SUBL: 0.4000 mg | SUBLINGUAL_TABLET | SUBLINGUAL | Status: DC | PRN

## 2013-04-27 MED ORDER — LISINOPRIL 2.5 MG PO TABS: 2.5000 mg | ORAL_TABLET | Freq: Every day | ORAL | Status: DC

## 2013-04-27 MED ORDER — ATORVASTATIN CALCIUM 40 MG PO TABS: 40.0000 mg | ORAL_TABLET | Freq: Every day | ORAL | Status: DC

## 2013-04-27 NOTE — Assessment & Plan Note (Signed)
Well-controlled in the office today. Will add a low-dose ACEi with underlying DM2. Check BMET in 1 week. She is to monitor for SEs including cough, lightheadedness, low BP, etc and call the office should these develop. May need to reduce atenolol to make room for ACEi therapy if labile BP symptoms arise.

## 2013-04-27 NOTE — Assessment & Plan Note (Signed)
Restart atorvastatin 40mg  daily. Check LFTs, lipid panel in 6 weeks.

## 2013-04-27 NOTE — Assessment & Plan Note (Addendum)
H/o isolated atrial fibrillation episode surrounding a prior heart cath. No further objective evidence since. The patient denies any episodes of palpitations. EKG indicates NSR in the office today. Advised to call the clinic should these symptoms arise to pursue cardiac monitoring.  CHADSAVSc is 4. Starting anticoagulation prophylactically would increase the risk of bleeding with triple therapy in a vasculopathic patient deriving benefit from DAPT. She is not completely unprotected on ASA/Plavix now. This will be continued, and we will monitor for recurrent a-fib.
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
## 2013-04-27 NOTE — Assessment & Plan Note (Signed)
The patient denies any chest pain or shortness of breath. Lifestyle modifications have lead to good control of her cardiac RFs (per patient). She is able to complete > 4 METs. Deemed to be stable from a cardiac standpoint. Will optimize cardiac medical regimen to include ASA/Plavix/ACEi/BB/statin- see below. She is at acceptable risk to undergo further treatment for her non-healing ulcer, invasive or otherwise.

## 2013-04-27 NOTE — Assessment & Plan Note (Addendum)
Patient reports good glycemic control. Currently on oral hypoglycemics. Has previously needed insulin. Discussed continued glycemic control to foster wound healing. Will start low-dose ACEi for renoprotection and with underlying CAD.

## 2013-04-27 NOTE — Patient Instructions (Addendum)
Please take new medications as prescribed.  Please monitor your blood pressure with lisinopril. Call for any new side effects including lightheadedness, low energy or feeling as if you may pass out.   We will start atorvastatin (Lipitor) given your history of coronary artery disease and peripheral vascular disease. We will check cholesterol and liver function in 6 weeks with this..   You are stable from a cardiac standpoint to undergo hyperbaric wound care treatments.  We will plan to see you back in 3 months.

## 2013-04-27 NOTE — Assessment & Plan Note (Addendum)
She has a non-healing L plantar foot ulcer. She states the next plan for treatment is hyperbaric oxygen therapy. She denies upcoming plans for invasive procedures. She is stable from a cardiac standpoint. Continue DAPT. Will restart atorvastatin based on the new ACC/AHA lipid guidelines- the patient has two incidents of ASCVD in CAD and PVD. High potency statin recommended with concurrent diabetes.

## 2013-05-02 ENCOUNTER — Telehealth: Payer: Self-pay | Admitting: *Deleted

## 2013-05-02 NOTE — Telephone Encounter (Signed)
Faxed note to wound care w/ attention brought to areas where cardiac clearance is mentioned.

## 2013-05-02 NOTE — Telephone Encounter (Signed)
Wound care outpatient [facility]. Needing cardiac clearance for hyperbaric chamber wound care. Not clarified last note please order.  Fax # [phone]

## 2013-05-09 ENCOUNTER — Encounter (HOSPITAL_BASED_OUTPATIENT_CLINIC_OR_DEPARTMENT_OTHER): Payer: Medicare Other | Attending: General Surgery

## 2013-05-09 DIAGNOSIS — L84 Corns and callosities: Secondary | ICD-10-CM | POA: Insufficient documentation

## 2013-05-09 DIAGNOSIS — L97509 Non-pressure chronic ulcer of other part of unspecified foot with unspecified severity: Secondary | ICD-10-CM | POA: Insufficient documentation

## 2013-05-09 DIAGNOSIS — E1169 Type 2 diabetes mellitus with other specified complication: Secondary | ICD-10-CM | POA: Insufficient documentation

## 2013-05-14 LAB — GLUCOSE, CAPILLARY: Glucose-Capillary: 132 mg/dL — ABNORMAL HIGH (ref 70–99)

## 2013-05-15 LAB — GLUCOSE, CAPILLARY: Glucose-Capillary: 182 mg/dL — ABNORMAL HIGH (ref 70–99)

## 2013-05-16 LAB — GLUCOSE, CAPILLARY: Glucose-Capillary: 210 mg/dL — ABNORMAL HIGH (ref 70–99)

## 2013-05-18 LAB — GLUCOSE, CAPILLARY: Glucose-Capillary: 158 mg/dL — ABNORMAL HIGH (ref 70–99)

## 2013-05-21 LAB — GLUCOSE, CAPILLARY
Glucose-Capillary: 161 mg/dL — ABNORMAL HIGH (ref 70–99)
Glucose-Capillary: 179 mg/dL — ABNORMAL HIGH (ref 70–99)

## 2013-05-23 LAB — GLUCOSE, CAPILLARY
Glucose-Capillary: 161 mg/dL — ABNORMAL HIGH (ref 70–99)
Glucose-Capillary: 153 mg/dL — ABNORMAL HIGH (ref 70–99)

## 2013-05-25 LAB — GLUCOSE, CAPILLARY
Glucose-Capillary: 207 mg/dL — ABNORMAL HIGH (ref 70–99)
Glucose-Capillary: 248 mg/dL — ABNORMAL HIGH (ref 70–99)

## 2013-05-28 LAB — GLUCOSE, CAPILLARY
Glucose-Capillary: 166 mg/dL — ABNORMAL HIGH (ref 70–99)
Glucose-Capillary: 184 mg/dL — ABNORMAL HIGH (ref 70–99)

## 2013-06-06 LAB — GLUCOSE, CAPILLARY: Glucose-Capillary: 226 mg/dL — ABNORMAL HIGH (ref 70–99)

## 2013-06-07 LAB — GLUCOSE, CAPILLARY
Glucose-Capillary: 185 mg/dL — ABNORMAL HIGH (ref 70–99)
Glucose-Capillary: 269 mg/dL — ABNORMAL HIGH (ref 70–99)

## 2013-06-08 LAB — GLUCOSE, CAPILLARY
Glucose-Capillary: 168 mg/dL — ABNORMAL HIGH (ref 70–99)
Glucose-Capillary: 237 mg/dL — ABNORMAL HIGH (ref 70–99)

## 2013-06-11 ENCOUNTER — Encounter (HOSPITAL_BASED_OUTPATIENT_CLINIC_OR_DEPARTMENT_OTHER): Payer: Medicare Other | Attending: General Surgery

## 2013-06-11 DIAGNOSIS — L97509 Non-pressure chronic ulcer of other part of unspecified foot with unspecified severity: Secondary | ICD-10-CM | POA: Insufficient documentation

## 2013-06-11 DIAGNOSIS — L84 Corns and callosities: Secondary | ICD-10-CM | POA: Insufficient documentation

## 2013-06-11 DIAGNOSIS — E1169 Type 2 diabetes mellitus with other specified complication: Secondary | ICD-10-CM | POA: Insufficient documentation

## 2013-06-12 LAB — GLUCOSE, CAPILLARY
Glucose-Capillary: 193 mg/dL — ABNORMAL HIGH (ref 70–99)
Glucose-Capillary: 233 mg/dL — ABNORMAL HIGH (ref 70–99)

## 2013-06-13 LAB — GLUCOSE, CAPILLARY: Glucose-Capillary: 221 mg/dL — ABNORMAL HIGH (ref 70–99)

## 2013-06-14 LAB — GLUCOSE, CAPILLARY
Glucose-Capillary: 176 mg/dL — ABNORMAL HIGH (ref 70–99)
Glucose-Capillary: 188 mg/dL — ABNORMAL HIGH (ref 70–99)
Glucose-Capillary: 212 mg/dL — ABNORMAL HIGH (ref 70–99)

## 2013-06-15 LAB — GLUCOSE, CAPILLARY
Glucose-Capillary: 178 mg/dL — ABNORMAL HIGH (ref 70–99)
Glucose-Capillary: 186 mg/dL — ABNORMAL HIGH (ref 70–99)

## 2013-06-18 LAB — GLUCOSE, CAPILLARY
Glucose-Capillary: 186 mg/dL — ABNORMAL HIGH (ref 70–99)
Glucose-Capillary: 198 mg/dL — ABNORMAL HIGH (ref 70–99)

## 2013-06-20 LAB — GLUCOSE, CAPILLARY
Glucose-Capillary: 152 mg/dL — ABNORMAL HIGH (ref 70–99)
Glucose-Capillary: 157 mg/dL — ABNORMAL HIGH (ref 70–99)

## 2013-06-22 LAB — GLUCOSE, CAPILLARY: Glucose-Capillary: 162 mg/dL — ABNORMAL HIGH (ref 70–99)

## 2013-06-25 LAB — GLUCOSE, CAPILLARY: Glucose-Capillary: 169 mg/dL — ABNORMAL HIGH (ref 70–99)

## 2013-06-26 LAB — GLUCOSE, CAPILLARY: Glucose-Capillary: 154 mg/dL — ABNORMAL HIGH (ref 70–99)

## 2013-06-28 LAB — GLUCOSE, CAPILLARY
Glucose-Capillary: 165 mg/dL — ABNORMAL HIGH (ref 70–99)
Glucose-Capillary: 162 mg/dL — ABNORMAL HIGH (ref 70–99)

## 2013-07-03 LAB — GLUCOSE, CAPILLARY
Glucose-Capillary: 157 mg/dL — ABNORMAL HIGH (ref 70–99)
Glucose-Capillary: 136 mg/dL — ABNORMAL HIGH (ref 70–99)

## 2013-07-09 ENCOUNTER — Encounter (HOSPITAL_BASED_OUTPATIENT_CLINIC_OR_DEPARTMENT_OTHER): Payer: Medicare Other | Attending: General Surgery

## 2013-07-09 DIAGNOSIS — L97509 Non-pressure chronic ulcer of other part of unspecified foot with unspecified severity: Secondary | ICD-10-CM | POA: Insufficient documentation

## 2013-07-09 DIAGNOSIS — E1169 Type 2 diabetes mellitus with other specified complication: Secondary | ICD-10-CM | POA: Insufficient documentation

## 2013-07-09 LAB — GLUCOSE, CAPILLARY: Glucose-Capillary: 143 mg/dL — ABNORMAL HIGH (ref 70–99)

## 2013-07-10 LAB — GLUCOSE, CAPILLARY
Glucose-Capillary: 167 mg/dL — ABNORMAL HIGH (ref 70–99)
Glucose-Capillary: 148 mg/dL — ABNORMAL HIGH (ref 70–99)

## 2013-07-11 LAB — GLUCOSE, CAPILLARY: Glucose-Capillary: 153 mg/dL — ABNORMAL HIGH (ref 70–99)

## 2013-07-12 NOTE — Progress Notes (Signed)
[facility]  NAME:  [name], [name]               ACCOUNT NO.:  [id number]  MEDICAL RECORD NO.:  [id number]      DATE OF BIRTH:  January 19, 1951  PHYSICIAN:  Ardath Sax, M.D.           VISIT DATE:                                  OFFICE VISIT   This lady is a 61 year old, Caucasian female, diabetic, who has been coming to [place] with a plantar diabetic foot ulcer for a couple of months. During this period of time, she has been debrided and she has been treated with silver collagen.  She has also been in hyperbaric oxygen. This ulcer, which was quite deep by about 5 mm and 1 cm and 3/4 in diameter, has now shrunk down considerably with the treatment of offloading it and giving her hyperbaric oxygen treatments.  The ulcer is now about 8 mm in diameter and is very shallow, only 1 mm, and I feel that we should continue using collagen and offloading, and I would like to give her another month of hyperbaric oxygen treatments as I think it is going to heal her up nicely in that time.  So, we are applying for another term of hyperbaric oxygen treatments to cure this diabetic ulcer.     Ardath Sax, M.D.     PP/MEDQ  D:  07/11/2013  T:  07/12/2013  Job:  [id number]

## 2013-07-13 LAB — GLUCOSE, CAPILLARY: Glucose-Capillary: 134 mg/dL — ABNORMAL HIGH (ref 70–99)

## 2013-07-16 LAB — GLUCOSE, CAPILLARY
Glucose-Capillary: 135 mg/dL — ABNORMAL HIGH (ref 70–99)
Glucose-Capillary: 153 mg/dL — ABNORMAL HIGH (ref 70–99)

## 2013-07-17 ENCOUNTER — Encounter: Payer: Self-pay | Admitting: *Deleted

## 2013-07-17 ENCOUNTER — Ambulatory Visit: Payer: Medicare Other | Admitting: Cardiovascular Disease

## 2013-07-17 LAB — GLUCOSE, CAPILLARY
Glucose-Capillary: 187 mg/dL — ABNORMAL HIGH (ref 70–99)
Glucose-Capillary: 163 mg/dL — ABNORMAL HIGH (ref 70–99)

## 2013-07-18 LAB — GLUCOSE, CAPILLARY: Glucose-Capillary: 179 mg/dL — ABNORMAL HIGH (ref 70–99)

## 2013-07-20 LAB — GLUCOSE, CAPILLARY: Glucose-Capillary: 131 mg/dL — ABNORMAL HIGH (ref 70–99)

## 2013-07-23 LAB — GLUCOSE, CAPILLARY: Glucose-Capillary: 131 mg/dL — ABNORMAL HIGH (ref 70–99)

## 2013-07-24 LAB — GLUCOSE, CAPILLARY
Glucose-Capillary: 154 mg/dL — ABNORMAL HIGH (ref 70–99)
Glucose-Capillary: 106 mg/dL — ABNORMAL HIGH (ref 70–99)

## 2013-07-25 LAB — GLUCOSE, CAPILLARY: Glucose-Capillary: 150 mg/dL — ABNORMAL HIGH (ref 70–99)

## 2013-07-27 LAB — GLUCOSE, CAPILLARY
Glucose-Capillary: 127 mg/dL — ABNORMAL HIGH (ref 70–99)
Glucose-Capillary: 161 mg/dL — ABNORMAL HIGH (ref 70–99)

## 2013-07-30 LAB — GLUCOSE, CAPILLARY: Glucose-Capillary: 168 mg/dL — ABNORMAL HIGH (ref 70–99)

## 2013-07-31 LAB — GLUCOSE, CAPILLARY
Glucose-Capillary: 125 mg/dL — ABNORMAL HIGH (ref 70–99)
Glucose-Capillary: 175 mg/dL — ABNORMAL HIGH (ref 70–99)

## 2013-08-06 LAB — GLUCOSE, CAPILLARY: Glucose-Capillary: 128 mg/dL — ABNORMAL HIGH (ref 70–99)

## 2013-08-07 LAB — GLUCOSE, CAPILLARY
Glucose-Capillary: 169 mg/dL — ABNORMAL HIGH (ref 70–99)
Glucose-Capillary: 219 mg/dL — ABNORMAL HIGH (ref 70–99)

## 2013-08-10 ENCOUNTER — Encounter (HOSPITAL_BASED_OUTPATIENT_CLINIC_OR_DEPARTMENT_OTHER): Payer: Medicare Other | Attending: General Surgery

## 2013-08-17 ENCOUNTER — Encounter: Payer: Self-pay | Admitting: Surgery

## 2013-08-20 ENCOUNTER — Encounter: Payer: Self-pay | Admitting: Surgery

## 2013-08-24 ENCOUNTER — Ambulatory Visit: Payer: Self-pay | Admitting: Surgery

## 2013-08-31 LAB — WOUND AEROBIC CULTURE

## 2013-09-09 ENCOUNTER — Encounter: Payer: Self-pay | Admitting: Surgery

## 2013-09-26 ENCOUNTER — Ambulatory Visit: Payer: Self-pay | Admitting: Vascular Surgery

## 2013-09-26 LAB — BASIC METABOLIC PANEL
ANION GAP: 1 — AB (ref 7–16)
BUN: 19 mg/dL — ABNORMAL HIGH (ref 7–18)
CREATININE: 0.85 mg/dL (ref 0.60–1.30)
Calcium, Total: 9.8 mg/dL (ref 8.5–10.1)
Chloride: 104 mmol/L (ref 98–107)
Co2: 31 mmol/L (ref 21–32)
EGFR (Non-African Amer.): >60
Glucose: 145 mg/dL — ABNORMAL HIGH (ref 65–99)
Osmolality: 277 (ref 275–301)
Potassium: 4.5 mmol/L (ref 3.5–5.1)
SODIUM: 136 mmol/L (ref 136–145)

## 2013-10-05 ENCOUNTER — Other Ambulatory Visit: Payer: Self-pay | Admitting: Surgery

## 2013-10-07 ENCOUNTER — Encounter: Payer: Self-pay | Admitting: Surgery

## 2013-10-09 LAB — WOUND AEROBIC CULTURE

## 2013-10-10 ENCOUNTER — Telehealth: Payer: Self-pay

## 2013-10-10 NOTE — Telephone Encounter (Signed)
Pt is having an MRI on Friday and needs to know what type of stent and when it was placed. Please call.

## 2013-10-11 ENCOUNTER — Encounter: Payer: Self-pay | Admitting: *Deleted

## 2013-10-11 NOTE — Telephone Encounter (Signed)
Left detailed message on pt's vm w/ requested info. Asked pt to call w/ further questions or concerns.

## 2013-10-12 ENCOUNTER — Ambulatory Visit: Payer: Self-pay | Admitting: Surgery

## 2013-11-07 ENCOUNTER — Encounter: Payer: Self-pay | Admitting: Surgery

## 2013-11-29 ENCOUNTER — Other Ambulatory Visit (HOSPITAL_COMMUNITY): Payer: Self-pay | Admitting: Orthopedic Surgery

## 2013-11-29 NOTE — Progress Notes (Signed)
Spoke with pt to do SDW pre-op call. Pt stated that her "medical doctor felt that the procedure was not necessary" so she wants to cancel.  Pt advised to call surgeon to make aware.

## 2013-11-30 ENCOUNTER — Ambulatory Visit (HOSPITAL_COMMUNITY): Admission: RE | Admit: 2013-11-30 | Payer: Medicare Other | Source: Ambulatory Visit | Admitting: Orthopedic Surgery

## 2013-11-30 ENCOUNTER — Encounter (HOSPITAL_COMMUNITY): Admission: RE | Payer: Self-pay | Source: Ambulatory Visit

## 2013-11-30 SURGERY — AMPUTATION, FOOT, RAY
Anesthesia: General | Site: Foot | Laterality: Left

## 2013-12-03 LAB — WOUND AEROBIC CULTURE

## 2013-12-07 ENCOUNTER — Encounter: Payer: Self-pay | Admitting: Surgery

## 2013-12-20 DIAGNOSIS — E669 Obesity, unspecified: Secondary | ICD-10-CM | POA: Insufficient documentation

## 2013-12-20 DIAGNOSIS — D649 Anemia, unspecified: Secondary | ICD-10-CM | POA: Insufficient documentation

## 2014-01-07 ENCOUNTER — Encounter: Payer: Self-pay | Admitting: Surgery

## 2014-02-06 ENCOUNTER — Encounter: Payer: Self-pay | Admitting: Surgery

## 2014-02-12 NOTE — Telephone Encounter (Signed)
This encounter was created in error - please disregard.

## 2014-03-06 ENCOUNTER — Ambulatory Visit: Payer: Self-pay | Admitting: Surgery

## 2014-03-07 LAB — WOUND AEROBIC CULTURE

## 2014-03-09 ENCOUNTER — Encounter: Payer: Self-pay | Admitting: Surgery

## 2014-04-09 ENCOUNTER — Encounter: Payer: Self-pay | Admitting: Surgery

## 2014-05-08 LAB — WOUND AEROBIC CULTURE

## 2014-05-09 ENCOUNTER — Encounter: Payer: Self-pay | Admitting: Surgery

## 2014-06-09 ENCOUNTER — Encounter: Payer: Self-pay | Admitting: Surgery

## 2014-07-09 ENCOUNTER — Encounter: Payer: Self-pay | Admitting: Surgery

## 2014-08-09 ENCOUNTER — Encounter: Payer: Self-pay | Admitting: Surgery

## 2014-08-21 ENCOUNTER — Ambulatory Visit: Payer: Self-pay | Admitting: General Surgery

## 2014-09-03 DIAGNOSIS — H2510 Age-related nuclear cataract, unspecified eye: Secondary | ICD-10-CM | POA: Insufficient documentation

## 2014-09-09 HISTORY — PX: CATARACT EXTRACTION W/ INTRAOCULAR LENS  IMPLANT, BILATERAL: SHX1307

## 2014-10-01 DIAGNOSIS — F5104 Psychophysiologic insomnia: Secondary | ICD-10-CM | POA: Insufficient documentation

## 2014-11-29 NOTE — Op Note (Signed)
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   PROCEDURES PERFORMED: 1.  Abdominal aortogram.  2.  Left lower extremity distal runoff.  3.  Crosser atherectomy of the left posterior tibial artery.  4.  Percutaneous transluminal angioplasty of the left posterior tibial artery to 2.5 mm.  5.  Percutaneous transluminal angioplasty of the left tibioperoneal trunk to 3.2 mm.  6.  Percutaneous transluminal angioplasty of the left SFA to 5 mm.   SURGEON:  [name], M.D.   SEDATION:  Versed 7 mg, plus fentanyl 400 mcg administered IV.  Continuous ECG, pulse oximetry and cardiopulmonary monitoring is performed throughout the entire procedure by the interventional radiology nurse.  Total sedation time was 1 hour 45 minutes.   ACCESS:  A 6 French sheath, right common femoral artery.   FLUOROSCOPY TIME:  12.0 minutes.   CONTRAST USED:  Isovue 85 mL.   INDICATIONS:  Mrs. Culbreath is a 64 year old woman who presents referred from Dr. Charlsie Merles for evaluation of her plantar foot ulcer.  Physical examination as well as noninvasive studies suggested significant atherosclerotic occlusive disease and she is therefore undergoing angiography with the hope for intervention.  The risks and benefits were reviewed.  All questions were answered.  The patient agrees to proceed.   DESCRIPTION OF PROCEDURE:  The patient is taken to special procedures and placed in the supine position.  After adequate sedation has been achieved, both groins are prepped and draped in a sterile fashion.  Ultrasound is placed in a sterile sleeve.  Ultrasound is utilized secondary to lack of appropriate  landmarks and to avoid vascular injury.  Under direct ultrasound visualization, the common femoral artery is identified.  It is echolucent and pulsatile indicating patency.  Images recorded for the permanent record.  Then, 1% lidocaine is infiltrated in the soft tissues under ultrasound visualization and a micropuncture needle is used to access the anterior wall of the common femoral artery.  Microwire followed by micro sheath, J-wire followed by 5 French sheath and 5 French pigtail catheter.  The pigtail catheter is positioned at the level of T12.   AP projection of the abdominal aorta is then obtained.  Pigtail catheter is repositioned to above the bifurcation and an RAO projection is then obtained.  A stiff angled Glidewire and pigtail catheter were used to cross the aortic bifurcation.  The catheter wire combination negotiated down to the groin where an LAO projection of the groin is obtained.  Wire and catheter are then negotiated into the SFA and distal runoff is then obtained.  There are tandem lesions of approximately 65% to 70% in the distal SFA just about at Hunter's canal.  Distally the tibioperoneal trunk demonstrates a string sign and there is occlusion of the posterior tibial throughout the majority of its course.  Peroneal was patent, but there is a moderate stenosis at its origin.  Anterior tibial occludes just past the curve near its origin and remains occluded down to about the level of the ankle.  Lateral plantar is reconstituted distally and appears to be the dominant runoff to the foot.   Five thousand units of heparin was given.  Stiff angled Glidewire was reintroduced and the pigtail catheter and 5 [place]  sheath are removed.  A 6 French Raby sheath is advanced up and over.  A slip catheter and Glidewire are then used to position the catheter in the tibioperoneal trunk, 0.014 grand slam wire is then advanced through the catheter.  The catheter is removed.  A straight Usher catheter is  advanced over the 0.014 wire and the 0.014 wire is removed.  S6 crosser atherectomy is then positioned in the distal tibia peroneal trunk.  It is then engaged and negotiated into the posterior tibial.  Using a combination of the crosser atherectomy catheter and the Usher catheter, the crosser is advanced down through the posterior tibial to the level of the medial malleolus.  Catheter is then removed and the 0.014  grand slam wire reintroduced.  Usher catheter is then removed and a quick cross 0.18 catheter is advanced over the wire and positioned at the level of the malleolus which should be reconstitution of the distal posterior tibial/lateral plantar.  Hand injection of contrast verifies that this is indeed the true lumen and crossing of the occlusion has been successful.  A 0.014 wire is reintroduced and a 2.5 x 22 cm balloon is then advanced over the wire, inflation is to 14 atmospheres for 2 minutes.  Follow-up angiography demonstrates excellent result throughout the course of the posterior tibial with preservation of filling in the lateral plantar.  The tibioperoneal trunk still has greater than 80% stenosis throughout its midportion and a 3 x 8 balloon is advanced across the lesion and positioned extending from the distal tibia peroneal trunk to the popliteal.  Inflation is to 12 atmospheres for two minutes.   Follow-up angiography demonstrates that there is now a wide patency of the tibioperoneal trunk.  There is no evidence of dissection.  There is wide patency of the posterior tibial as well.  The peroneal has been preserved and there is continued moderate stenosis at its origin, but this does not appear to be flow-limiting.   A 5 x 8 balloon is then opened onto the field.  Magnified imaging of the distal SFA is then obtained and the balloon is positioned across the tandem lesions, inflated to 10 atmospheres for approximately 1.5 minutes.  Follow-up angiography demonstrates an excellent result.  No  evidence of dissection.  Therefore, distal runoff from the level of the SFA intervention down to the plantar arch is re-performed and demonstrates wide patency with two vessel runoff.   The sheath is then pulled into the right external iliac, oblique view obtained and a Mynx device deployed without difficulty after exchanging for an 11 cm 6 French sheath.   INTERPRETATION:  The abdominal aorta, bilateral common iliacs and external iliac arteries are patent.   The left common femoral and profunda femoris are widely patent.  The superficial femoral artery is patent, however in the distal portion near Hunter's canal.  There are tandem lesions of approximately 75%.  Popliteal is patent.  Tibioperoneal trunk demonstrates a string sign greater than 95% stenosis throughout the majority of its course.  Anterior tibial occludes just after the typical curve and does not reconstitute significantly.  The peroneal is reconstituted, but does appear to have narrowing in its proximal area, perhaps at the origin by the initial film and the posterior tibial demonstrates a faint string at its origin, but occludes throughout the majority of its course.  There is reconstitution of the distal posterior tibial/lateral plantar and the lateral plantar, medial plantar field with the pedal arch.   Following crosser atherectomy  and subsequently angioplasty of the posterior tibial is now widely patent and well-sized compared to the peroneal.  Following angioplasty to 3 mm for the tibioperoneal trunk it is now widely patent.  Following angioplasty of the SFA to 5 mm, this too is widely patent.  There is no evidence of dissection throughout the intervention sites and no stenting is performed at this time.  There is now two vessel runoff to the foot.   SUMMARY:  Successful recanalization of the left lower extremity vasculature for limb salvage, as described above.    ____________________________ Renford DillsGregory G. Ellee Wawrzyniak,  MD ggs:ea D: 01/30/2013 18:52:47 ET T: 01/30/2013 23:16:21 ET JOB#: [id number]  cc: Renford DillsGregory G. Keniel Ralston, MD, <Dictator> Renford DillsGregory G. Aniesha Haughn, MD Duane LopeJeffrey D. Judithann SheenSparks, MD Dr. Thomasene MohairNagel  Jayla Mackie G Lannie Yusuf MD ELECTRONICALLY SIGNED 02/13/2013 14:29

## 2014-11-30 NOTE — Op Note (Signed)
PATIENT NAME:  Dominique MaffucciOTEAT, Sharday K MR#:  [id number] DATE OF BIRTH:  [birth year]  DATE OF PROCEDURE:  09/26/2013  PREOPERATIVE DIAGNOSIS: Atherosclerotic occlusive disease, bilateral lower extremities with ulceration of the left ankle.   POSTOPERATIVE DIAGNOSIS: Atherosclerotic occlusive disease, bilateral lower extremities with ulceration of the left ankle.   PROCEDURES PERFORMED:  1.  Abdominal aortogram.  2.  Left lower extremity distal runoff, third order catheter placement.  3.  Additional third order catheter placement.  4.  Percutaneous transluminal angioplasty of the peroneal artery to 3 mm.  5.  Percutaneous transluminal angioplasty of the tibioperoneal trunk to 4 mm using a Lutonix balloon.  6.  Percutaneous transluminal angioplasty of the left superficial femoral artery to 6 mm using a Lutonix balloon.   SURGEON: Levora DredgeGregory Charday Capetillo, M.D.   SEDATION: Versed 5 mg plus fentanyl 250 mcg administered IV. Continuous ECG, pulse oximetry and cardiopulmonary monitoring is performed throughout the entire procedure by the interventional radiology nurse. Total sedation time was 1 hour, 20 minutes.   ACCESS: A 6-French sheath, right common femoral artery.   FLUOROSCOPY TIME: 8.6 minutes.   CONTRAST USED: Isovue 95 mL.   INDICATIONS: [name] is a 64 year old woman with significant atherosclerotic occlusive disease and nonhealing wound of her left ankle. The risks and benefits for angiography with the hope for intervention for limb salvage was reviewed. All questions were answered. The patient agrees to proceed.   DESCRIPTION OF PROCEDURE: The patient was taken to special procedures and placed in the supine position. After adequate sedation is achieved, both groins are prepped and draped in a sterile fashion. Appropriate timeout is called.   Ultrasound is placed in a sterile sleeve. Ultrasound is utilized secondary to lack of appropriate landmarks and to avoid vascular injury. Under direct  ultrasound visualization, common femoral artery is identified. It is echolucent and pulsatile indicating patency. Image is recorded for the permanent record.   Access is obtained to the common femoral artery under direct ultrasound visualization with a micropuncture needle, microwire followed by microsheath, J-wire followed by a 5-French sheath and 5 French pigtail catheter. The pigtail catheter is positioned at the level of T12 and AP projection of the aorta is obtained. Pigtail catheter is repositioned to above the bifurcation and an RAO projection of the pelvis is obtained. A stiff angled Glidewire and pigtail catheter are then used to cross the aortic bifurcation. The catheter is advanced into the distal external iliac and an LAO projection of the left groin is obtained. Wire is reintroduced and negotiated into the SFA followed by the catheter. Distal runoff is then obtained. 90% stenosis of the distal SFA at Hunter's canal is identified with occlusion of the tibioperoneal trunk and tibial vessels noted. Heparin 5000 units is given. A stiff angled Glidewire is reintroduced and the pigtail catheter and 5-French sheath are removed. A 6-French Raby sheath is then advanced up and over the bifurcation, positioned with its tip in the proximal one third of the SFA. A 125 straight slip catheter is then advanced over the wire and used to negotiate first into the posterior tibial where hand injection of contrast demonstrates occlusion of the posterior tibial in its midportion. There is faint reconstitution distally at the level of the ankle. The catheter is then backed up into the tibioperoneal trunk. Magnified view was obtained by hand injection and the wire and subsequently the catheter negotiated into the peroneal representing the additional third order catheter placement. Hand injection of contrast is then used to demonstrate the  distal peroneal, which is the dominant runoff to the foot.   V-18 wire is introduced  through the catheter. Catheter is removed and a 3 x 15 Ultraverse balloon is advanced across the TP trunk and into the peroneal. Inflation is to 12 atmospheres for 3 minutes. Follow-up angiography demonstrates the peroneal and distal tibia peroneal trunk appear well expanded and widely patent now; however, there is still a significant residual stenosis in the proximal half of the TP trunk and therefore, a 4 x 4 Lutonix balloon is advanced over the wire down into the proximal first half of the TP trunk and inflated to 8 atmospheres for 3 minutes. Follow-up angiography demonstrates patency of the TP trunk and peroneal and attention is turned to the SFA lesion.   The 4 mm Lutonix balloon having been first used in the TP trunk, is then backed up and predilatation of the SFA lesion is performed. Subsequently, a 5 x 8 Lutonix is advanced and angioplasty of the distal SFA and proximal popliteal in the above-knee level is performed to 12 atmospheres for 3 minutes. Follow-up imaging demonstrates improvement, but there still greater than 50% residual stenosis and therefore a 6 x 6 balloon is advanced across this area and balloon angioplasty to 12 atmospheres for 2 minutes is performed. Follow-up angiography now demonstrates wide patency of the SFA and above-knee popliteal. There is a very small, non-flow-limiting dissection. Distal run off is then obtained, which is preserved.   The sheath is pulled back into the right external iliac. Wire is removed and exchanged for a J-wire. Oblique view of the right groin is obtained and a StarClose device deployed. There are no immediate complications.   INTERPRETATION: The abdominal aorta is diffusely calcified, but does not have any hemodynamically significant stenoses. The bilateral common and external iliac arteries are widely patent.   The left common femoral and profunda femoris are widely patent. SFA is patent throughout its proximal two thirds. At [facility] canal, there is  a fairly focal 90% stenosis with diffuse greater than 60% disease extending several centimeters distally. The mid popliteal and distal popliteal are widely patent. Tibioperoneal trunk occludes throughout the majority of its course. The anterior tibial occludes just after its origin and remains occluded throughout the lower extremity. Posterior tibial is poorly visualized at the level of the trifurcation. It does reconstitute distally, but its proximal two thirds are essentially occluded. Peroneal appears to be the largest tibial to the ankle area measuring approximately 2.5. It is occluded in its origin, but then maintains patency throughout.   Following angioplasty of the peroneal proximally to 3 mm and the tibioperoneal trunk to 4 mm and then the SFA to 6 mm as noted above, there is now wide patency with in-line flow down to the ankle.   SUMMARY: Successful recanalization of the left lower extremity as described above.  ____________________________ Renford Dills, MD ggs:aw D: 09/26/2013 12:00:43 ET T: 09/26/2013 12:42:26 ET JOB#: [id number]  cc: Renford Dills, MD, <Dictator> Duane Lope. Judithann Sheen, MD Renford Dills MD ELECTRONICALLY SIGNED 10/02/2013 10:19

## 2014-12-26 ENCOUNTER — Telehealth: Payer: Self-pay | Admitting: *Deleted

## 2014-12-26 NOTE — Telephone Encounter (Signed)
Pt is seeing Dr. Mariah MillingGollan tomorrow morning at 8:15, but has some questions, and would like the nurse to call her back today

## 2014-12-26 NOTE — Telephone Encounter (Signed)
Pt hasn't been seen since 2014.  Can we set her up to be seen?

## 2014-12-26 NOTE — Telephone Encounter (Signed)
Pt c/o of Chest Pain: STAT if CP now or developed within 24 hours  1. Are you having CP right now? Yes  2. Are you experiencing any other symptoms (ex. SOB, nausea, vomiting, sweating)? All symptoms  3. How long have you been experiencing CP? Last night  4. Is your CP continuous or coming and going? Coming and going.  5. Have you taken Nitroglycerin? no ?  Patient called with symptoms from last night Left arm sore and uncomfortable, bad headache and sweating and chest pain with sob. Took atenolol 100 mg at 7:45 and plavix 75 mg.

## 2014-12-26 NOTE — Telephone Encounter (Signed)
Spoke w/ pt.  She reports that she was making her bed yesterday when she developed chest pain and HA.  Pt is no longer symptomatic.  She requests a refill on her nitro and has questions about her stents and how long they last.  Advised her to keep appt w/ Dr. Mariah MillingGollan in the am and call 911 or proceed to ED if her sx become emergent.

## 2014-12-27 ENCOUNTER — Encounter: Payer: Self-pay | Admitting: Cardiovascular Disease

## 2014-12-27 ENCOUNTER — Ambulatory Visit (INDEPENDENT_AMBULATORY_CARE_PROVIDER_SITE_OTHER): Payer: Medicare Other | Admitting: Cardiovascular Disease

## 2014-12-27 ENCOUNTER — Ambulatory Visit
Admission: RE | Admit: 2014-12-27 | Discharge: 2014-12-27 | Disposition: A | Discharge status: Home / Self Care | Payer: Medicare Other | Source: Ambulatory Visit | Attending: Cardiovascular Disease | Admitting: Cardiovascular Disease

## 2014-12-27 ENCOUNTER — Other Ambulatory Visit
Admission: RE | Admit: 2014-12-27 | Discharge: 2014-12-27 | Disposition: A | Discharge status: Home / Self Care | Payer: Medicare Other | Source: Ambulatory Visit | Attending: Cardiovascular Disease | Admitting: Cardiovascular Disease

## 2014-12-27 VITALS — BP 130/68 | HR 62 | Ht 68.0 in | Wt 185.5 lb

## 2014-12-27 DIAGNOSIS — I480 Paroxysmal atrial fibrillation: Secondary | ICD-10-CM | POA: Diagnosis not present

## 2014-12-27 DIAGNOSIS — I209 Angina pectoris, unspecified: Secondary | ICD-10-CM

## 2014-12-27 DIAGNOSIS — R079 Chest pain, unspecified: Secondary | ICD-10-CM

## 2014-12-27 DIAGNOSIS — I10 Essential (primary) hypertension: Secondary | ICD-10-CM

## 2014-12-27 DIAGNOSIS — Z01810 Encounter for preprocedural cardiovascular examination: Secondary | ICD-10-CM | POA: Insufficient documentation

## 2014-12-27 DIAGNOSIS — I739 Peripheral vascular disease, unspecified: Secondary | ICD-10-CM

## 2014-12-27 DIAGNOSIS — Z01812 Encounter for preprocedural laboratory examination: Secondary | ICD-10-CM | POA: Diagnosis present

## 2014-12-27 DIAGNOSIS — E785 Hyperlipidemia, unspecified: Secondary | ICD-10-CM

## 2014-12-27 DIAGNOSIS — I2510 Atherosclerotic heart disease of native coronary artery without angina pectoris: Secondary | ICD-10-CM

## 2014-12-27 DIAGNOSIS — E1159 Type 2 diabetes mellitus with other circulatory complications: Secondary | ICD-10-CM

## 2014-12-27 LAB — CBC WITH DIFFERENTIAL/PLATELET
BASOS PCT: 1 %
Basophils Absolute: 0.1 10*3/uL (ref 0–0.1)
EOS ABS: 0.2 10*3/uL (ref 0–0.7)
Eosinophils Relative: 4 %
HCT: 36.5 % (ref 35.0–47.0)
Hemoglobin: 12.2 g/dL (ref 12.0–16.0)
Lymphocytes Relative: 18 %
Lymphs Abs: 1.2 10*3/uL (ref 1.0–3.6)
MCH: 28.5 pg (ref 26.0–34.0)
MCHC: 33.3 g/dL (ref 32.0–36.0)
MCV: 85.4 fL (ref 80.0–100.0)
Monocytes Absolute: 0.7 10*3/uL (ref 0.2–0.9)
Monocytes Relative: 10 %
NEUTROS PCT: 67 %
Neutro Abs: 4.7 10*3/uL (ref 1.4–6.5)
Platelets: 234 10*3/uL (ref 150–440)
RBC: 4.27 MIL/uL (ref 3.80–5.20)
RDW: 13.6 % (ref 11.5–14.5)
WBC: 6.9 10*3/uL (ref 3.6–11.0)

## 2014-12-27 LAB — BASIC METABOLIC PANEL
Anion gap: 7 (ref 5–15)
BUN: 21 mg/dL — AB (ref 6–20)
CO2: 28 mmol/L (ref 22–32)
CREATININE: 0.63 mg/dL (ref 0.44–1.00)
Calcium: 9.4 mg/dL (ref 8.9–10.3)
Chloride: 103 mmol/L (ref 101–111)
GFR calc Af Amer: >60 mL/min (ref >60)
GFR calc non Af Amer: >60 mL/min (ref >60)
GLUCOSE: 186 mg/dL — AB (ref 65–99)
POTASSIUM: 5 mmol/L (ref 3.5–5.1)
Sodium: 138 mmol/L (ref 135–145)

## 2014-12-27 LAB — PROTIME-INR
INR: 0.94
Prothrombin Time: 12.8 seconds (ref 11.4–15.0)

## 2014-12-27 MED ORDER — FUROSEMIDE 20 MG PO TABS: 20.0000 mg | ORAL_TABLET | Freq: Every day | ORAL | Status: DC | PRN

## 2014-12-27 MED ORDER — ATORVASTATIN CALCIUM 40 MG PO TABS: 40.0000 mg | ORAL_TABLET | Freq: Every day | ORAL | Status: DC

## 2014-12-27 MED ORDER — NITROGLYCERIN 0.4 MG SL SUBL: 0.4000 mg | SUBLINGUAL_TABLET | SUBLINGUAL | Status: DC | PRN

## 2014-12-27 NOTE — Patient Instructions (Addendum)
We will schedule a cardiac cath for angina, known CAD : Hold the diabetes medication and lasix the morning of the test  Please call us if you have new issues that need to be addressed before your next appt.  Your physician wants you to follow-up in: 1 month  [facility] Cardiac Cath Inst[facility]   You are scheduled for a Cardiac Cath on:___Friday, May 27______  Please arrive at 6:30am on the day of your procedure  You will need to pre-register prior to the day of your procedure.  [facility]  Registration is the first desk on your right.  Please take the procedure order we have given you in order to be registered appropriately  Do not eat/drink anything after midnight  Someone will need to drive you home  It is recommended someone be with you for the first 24 hours after your procedure  Wear clothes that are easy to get on/off and wear slip on shoes if possible   Medications bring a current list of all medications with you  ___ Do not take these medications before your procedure:____Januvia & Lasix_______________________________   Day of your procedure: Arrive at the Medical Mall entrance.  Free valet service is available.  After entering the Medical Mall please check-in at the registration desk (1st desk on your right) to receive your armband. After receiving your armband someone will escort you to the cardiac cath/special procedures waiting area.  The usual length of stay after your procedure is about 2 to 3 hours.  This can vary.  If you have any questions, please call our office at [phone], or you may call the cardiac [facility] lab at [facility] directly at [phone]   Angiogram An angiogram, also called angiography, is a procedure used to look at the blood vessels that carry blood to different parts of your body (arteries). In this procedure, dye is injected through a long, thin tube (catheter) into an artery. X-rays are then taken. The X-rays will show  if there is a blockage or problem in a blood vessel.  LET [facility] PROVID[facility] KNOW ABOUT:  Any allergies you have, including allergies to shellfish or contrast dye.   All medicines you are taking, including vitamins, herbs, eye drops, creams, and over-the-counter medicines.   Previous problems you or members of your family have had with the use of anesthetics.   Any blood disorders you have.   Previous surgeries you have had.  Any previous kidney problems or failure you have had.  Medical conditions you have.   Possibility of pregnancy, if this applies. RISKS AND COMPLICATIONS Generally, an angiogram is a safe procedure. However, as with any procedure, problems can occur. Possible problems include:  Injury to the blood vessels, including rupture or bleeding.  Infection or bruising at the catheter site.  Allergic reaction to the dye or contrast used.  Kidney damage from the dye or contrast used.  Blood clots that can lead to a stroke or heart attack. BEFORE THE PROCEDURE  Do not eat or drink after midnight on the night before the procedure, or as directed by your health care provider.   Ask your health care provider if you may drink enough water to take any needed medicines the morning of the procedure.  PROCEDURE  You may be given a medicine to help you relax (sedative) before and during the procedure. This medicine is given through an IV access tube that is inserted into one of your veins.  The area where the catheter will be inserted will be washed and shaved. This is usually done in the groin but may be done in the fold of your arm (near your elbow) or in the wrist.  A medicine will be given to numb the area where the catheter will be inserted (local anesthetic).  The catheter will be inserted with a guide wire into an artery. The catheter is guided by using a type of X-ray (fluoroscopy) to the blood vessel being examined.   Dye is then injected into  the catheter, and X-rays are taken. The dye helps to show where any narrowing or blockages are located.  AFTER THE PROCEDURE   If the procedure is done through the leg, you will be kept in bed lying flat for several hours. You will be instructed to not bend or cross your legs.  The insertion site will be checked frequently.  The pulse in your feet or wrist will be checked frequently.  Additional blood tests, X-rays, and electrocardiography may be done.   You may need to stay in the hospital overnight for observation.  Document Released: 05/05/2005 Document Revised: 07/31/2013 Document Reviewed: 12/27/2012 [facility] Patient Information 2015 [employer] [place]. This information is not intended to replace advice given to you by your health care provider. Make sure you discuss any questions you have with your health care provider.

## 2014-12-27 NOTE — Assessment & Plan Note (Signed)
She has follow-up with Dr. Lorretta HarpSchneir. Possible claudication symptoms. Known SFA disease Would likely benefit from repeat ABIs, lower extremity arterial Doppler

## 2014-12-27 NOTE — Assessment & Plan Note (Signed)
Encouraged her to stay on her atenolol. Unclear recent symptoms were secondary to atrial fibrillation.  If symptoms persist, we have offered a Holter monitor for 30 day event monitor

## 2014-12-27 NOTE — Assessment & Plan Note (Signed)
We have recommended she restart her Lipitor

## 2014-12-27 NOTE — Assessment & Plan Note (Signed)
We have encouraged continued exercise, careful diet management in an effort to lose weight. Medicines managed by primary care
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
## 2014-12-27 NOTE — Assessment & Plan Note (Signed)
Currently with no symptoms of angina. No further workup at this time. Continue current medication regimen. 

## 2014-12-27 NOTE — Assessment & Plan Note (Signed)
Worsening anginal symptoms over the past week. We have discussed the various treatment options including medical management, stress testing, catheterization. She prefers cardiac catheterization given known coronary artery disease. She is worried about progression of her in-stent restenosis previously seen on catheterization in 2010. Physician will be scheduled on Jan 03 2015 Risk and benefits were discussed with her including heart attack and stroke.  We have given her prescription for nitroglycerin, Lipitor renewed

## 2014-12-27 NOTE — Assessment & Plan Note (Signed)
New onset angina over the past week as detailed above.

## 2014-12-30 ENCOUNTER — Telehealth: Payer: Self-pay

## 2014-12-30 NOTE — Telephone Encounter (Signed)
Pt would like lab results. Please call after 3 pm

## 2014-12-30 NOTE — Telephone Encounter (Signed)
Pt called back, states she is available anytime.

## 2014-12-30 NOTE — Telephone Encounter (Signed)
Reviewed preliminary results w/ pt.   

## 2015-01-03 ENCOUNTER — Other Ambulatory Visit: Payer: Self-pay

## 2015-01-03 ENCOUNTER — Ambulatory Visit
Admission: RE | Admit: 2015-01-03 | Discharge: 2015-01-03 | Disposition: A | Discharge status: Home / Self Care | Payer: Medicare Other | Source: Ambulatory Visit | Attending: Cardiovascular Disease | Admitting: Cardiovascular Disease

## 2015-01-03 ENCOUNTER — Other Ambulatory Visit: Payer: Self-pay | Admitting: Cardiovascular Disease

## 2015-01-03 ENCOUNTER — Encounter: Payer: Self-pay | Admitting: *Deleted

## 2015-01-03 ENCOUNTER — Encounter
Admission: RE | Disposition: A | Discharge status: Home / Self Care | Payer: Self-pay | Source: Ambulatory Visit | Attending: Cardiovascular Disease

## 2015-01-03 DIAGNOSIS — Z9889 Other specified postprocedural states: Secondary | ICD-10-CM

## 2015-01-03 DIAGNOSIS — I2510 Atherosclerotic heart disease of native coronary artery without angina pectoris: Secondary | ICD-10-CM

## 2015-01-03 DIAGNOSIS — I209 Angina pectoris, unspecified: Secondary | ICD-10-CM | POA: Diagnosis present

## 2015-01-03 DIAGNOSIS — E785 Hyperlipidemia, unspecified: Secondary | ICD-10-CM | POA: Insufficient documentation

## 2015-01-03 DIAGNOSIS — I2582 Chronic total occlusion of coronary artery: Secondary | ICD-10-CM | POA: Insufficient documentation

## 2015-01-03 DIAGNOSIS — I25119 Atherosclerotic heart disease of native coronary artery with unspecified angina pectoris: Secondary | ICD-10-CM | POA: Insufficient documentation

## 2015-01-03 DIAGNOSIS — I10 Essential (primary) hypertension: Secondary | ICD-10-CM | POA: Diagnosis not present

## 2015-01-03 DIAGNOSIS — Z79899 Other long term (current) drug therapy: Secondary | ICD-10-CM | POA: Diagnosis not present

## 2015-01-03 DIAGNOSIS — I2584 Coronary atherosclerosis due to calcified coronary lesion: Secondary | ICD-10-CM | POA: Diagnosis not present

## 2015-01-03 DIAGNOSIS — I4891 Unspecified atrial fibrillation: Secondary | ICD-10-CM | POA: Diagnosis not present

## 2015-01-03 DIAGNOSIS — E119 Type 2 diabetes mellitus without complications: Secondary | ICD-10-CM | POA: Insufficient documentation

## 2015-01-03 DIAGNOSIS — R079 Chest pain, unspecified: Secondary | ICD-10-CM

## 2015-01-03 DIAGNOSIS — Z955 Presence of coronary angioplasty implant and graft: Secondary | ICD-10-CM | POA: Diagnosis not present

## 2015-01-03 DIAGNOSIS — Z7902 Long term (current) use of antithrombotics/antiplatelets: Secondary | ICD-10-CM | POA: Diagnosis not present

## 2015-01-03 DIAGNOSIS — I739 Peripheral vascular disease, unspecified: Secondary | ICD-10-CM | POA: Diagnosis not present

## 2015-01-03 DIAGNOSIS — Z7982 Long term (current) use of aspirin: Secondary | ICD-10-CM | POA: Insufficient documentation

## 2015-01-03 DIAGNOSIS — T82897A Other specified complication of cardiac prosthetic devices, implants and grafts, initial encounter: Secondary | ICD-10-CM | POA: Insufficient documentation

## 2015-01-03 HISTORY — PX: CARDIAC CATHETERIZATION: SHX172

## 2015-01-03 SURGERY — LEFT HEART CATH
Anesthesia: Moderate Sedation

## 2015-01-03 MED ORDER — NITROGLYCERIN 0.4 MG SL SUBL
SUBLINGUAL_TABLET | SUBLINGUAL | Status: AC
  Filled 2015-01-03: qty 1

## 2015-01-03 MED ORDER — MORPHINE SULFATE 2 MG/ML IJ SOLN
2.0000 mg | Freq: Once | INTRAMUSCULAR | Status: AC
  Administered 2015-01-03: 2 mg via INTRAVENOUS

## 2015-01-03 MED ORDER — ASPIRIN 81 MG PO CHEW
CHEWABLE_TABLET | ORAL | Status: AC
  Filled 2015-01-03: qty 4

## 2015-01-03 MED ORDER — SODIUM CHLORIDE 0.9 % IV SOLN: 250.0000 mL | INTRAVENOUS | Status: DC | PRN

## 2015-01-03 MED ORDER — MIDAZOLAM HCL 2 MG/2ML IJ SOLN
INTRAMUSCULAR | Status: AC
  Filled 2015-01-03: qty 2

## 2015-01-03 MED ORDER — SODIUM CHLORIDE 0.9 % IV SOLN: INTRAVENOUS | Status: AC

## 2015-01-03 MED ORDER — IOHEXOL 300 MG/ML  SOLN
INTRAMUSCULAR | Status: DC | PRN
  Administered 2015-01-03: 50 mL via INTRA_ARTERIAL
  Administered 2015-01-03: 100 mL via INTRA_ARTERIAL
  Administered 2015-01-03: 10 mL via INTRA_ARTERIAL

## 2015-01-03 MED ORDER — FENTANYL CITRATE (PF) 100 MCG/2ML IJ SOLN
INTRAMUSCULAR | Status: AC
  Filled 2015-01-03: qty 2

## 2015-01-03 MED ORDER — FENTANYL CITRATE (PF) 100 MCG/2ML IJ SOLN
INTRAMUSCULAR | Status: DC | PRN
  Administered 2015-01-03 (×4): 25 ug via INTRAVENOUS

## 2015-01-03 MED ORDER — ACETAMINOPHEN 325 MG PO TABS: 650.0000 mg | ORAL_TABLET | ORAL | Status: DC | PRN

## 2015-01-03 MED ORDER — ASPIRIN 81 MG PO CHEW
324.0000 mg | CHEWABLE_TABLET | Freq: Once | ORAL | Status: AC
  Administered 2015-01-03: 324 mg via ORAL

## 2015-01-03 MED ORDER — ONDANSETRON HCL 4 MG/2ML IJ SOLN: 4.0000 mg | Freq: Four times a day (QID) | INTRAMUSCULAR | Status: DC | PRN

## 2015-01-03 MED ORDER — ROSUVASTATIN CALCIUM 20 MG PO TABS: 20.0000 mg | ORAL_TABLET | Freq: Every day | ORAL | Status: DC

## 2015-01-03 MED ORDER — LORAZEPAM 2 MG/ML IJ SOLN
1.0000 mg | INTRAMUSCULAR | Status: DC | PRN
  Administered 2015-01-03: 1 mg via INTRAVENOUS
  Filled 2015-01-03: qty 0.5

## 2015-01-03 MED ORDER — HYDRALAZINE HCL 20 MG/ML IJ SOLN
10.0000 mg | INTRAMUSCULAR | Status: DC | PRN
  Administered 2015-01-03 (×2): 10 mg via INTRAVENOUS

## 2015-01-03 MED ORDER — GI COCKTAIL ~~LOC~~
30.0000 mL | Freq: Once | ORAL | Status: AC
  Administered 2015-01-03: 30 mL via ORAL
  Filled 2015-01-03: qty 30

## 2015-01-03 MED ORDER — SODIUM CHLORIDE 0.9 % IJ SOLN: 3.0000 mL | INTRAMUSCULAR | Status: DC | PRN

## 2015-01-03 MED ORDER — SODIUM CHLORIDE 0.9 % IJ SOLN: 3.0000 mL | Freq: Two times a day (BID) | INTRAMUSCULAR | Status: DC

## 2015-01-03 MED ORDER — NITROGLYCERIN 0.4 MG SL SUBL
0.4000 mg | SUBLINGUAL_TABLET | SUBLINGUAL | Status: DC | PRN
  Administered 2015-01-03 (×4): 0.4 mg via SUBLINGUAL

## 2015-01-03 MED ORDER — NITROGLYCERIN 0.4 MG SL SUBL
SUBLINGUAL_TABLET | SUBLINGUAL | Status: AC
  Administered 2015-01-03: 0.4 mg via SUBLINGUAL
  Filled 2015-01-03: qty 1

## 2015-01-03 MED ORDER — MIDAZOLAM HCL 2 MG/2ML IJ SOLN
INTRAMUSCULAR | Status: DC | PRN
  Administered 2015-01-03 (×2): 1 mg via INTRAVENOUS
  Administered 2015-01-03: 2 mg via INTRAVENOUS

## 2015-01-03 MED ORDER — MORPHINE SULFATE 2 MG/ML IJ SOLN
INTRAMUSCULAR | Status: AC
  Filled 2015-01-03: qty 1

## 2015-01-03 MED ORDER — HYDRALAZINE HCL 20 MG/ML IJ SOLN
INTRAMUSCULAR | Status: AC
  Administered 2015-01-03: 10 mg via INTRAVENOUS
  Filled 2015-01-03: qty 1

## 2015-01-03 MED ORDER — MORPHINE SULFATE 2 MG/ML IJ SOLN
INTRAMUSCULAR | Status: AC
  Administered 2015-01-03: 2 mg via INTRAMUSCULAR
  Filled 2015-01-03: qty 1

## 2015-01-03 SURGICAL SUPPLY — 10 items
CATH INFINITI 5FR ANG PIGTAIL (CATHETERS) ×1 IMPLANT
CATH INFINITI 5FR JL4 (CATHETERS) ×1 IMPLANT
CATH INFINITI JR4 5F (CATHETERS) ×1 IMPLANT
DEVICE CLOSURE MYNXGRIP 5F (Vascular Products) ×1 IMPLANT
KIT MANI 3VAL PERCEP (MISCELLANEOUS) ×1 IMPLANT
NDL PERC 18GX7CM (NEEDLE) IMPLANT
NEEDLE PERC 18GX7CM (NEEDLE) ×2 IMPLANT
PACK CARDIAC CATH (CUSTOM PROCEDURE TRAY) ×1 IMPLANT
SHEATH AVANTI 5FR X 11CM (SHEATH) ×1 IMPLANT
WIRE EMERALD 3MM-J .035X150CM (WIRE) ×1 IMPLANT

## 2015-01-03 NOTE — OR Nursing (Signed)
Telephone discussion with dr Mariah Millinggollan, pt pain decreased to 5/10 with lessening of pain left arm, (after pt sitting up and eating lunch)> GI cocktail ordered, ativan 1 mg offered to pt who refused dose at this time. Asked if Dr Raynald KempGollon would write prescription for her nerves for discharge.

## 2015-01-03 NOTE — OR Nursing (Signed)
Per md instruction, informed pt that may have to transfer to [facility] today, trying to reposition her and give her crackers to eat. After ekg

## 2015-01-03 NOTE — OR Nursing (Signed)
Dr Mariah MillingGollan called, pt pain free and requesting discharge. Pt daughter to pick up anxiety prescription today from office, and ok to discharge to home.

## 2015-01-03 NOTE — OR Nursing (Signed)
Dr Mariah Millinggollan discussed with pt the results of test, pt developed sharp chest pain/

## 2015-01-03 NOTE — OR Nursing (Signed)
Dr Mariah MillingGollan notified that pt more relaxed but still having pain mid chest, with tightness in jaw and left arm. Informed of decrease in BP and pt more sedated. Aspirin ordered.

## 2015-01-03 NOTE — Discharge Instructions (Signed)

## 2015-01-03 NOTE — OR Nursing (Signed)
Dr Mariah Millinggollan called pain continues despite three sl ntg, 2 mg morphine 10 mg hydrazaline. New orders recieved

## 2015-01-03 NOTE — OR Nursing (Signed)
Dr Mariah MillingGollan notified that pt calm and vs stable, intermittant change noted in telemetry EKG, 12 lead EKG ordered, pt continues to have chest pain with jaw and left arm tightness.

## 2015-01-05 ENCOUNTER — Telehealth: Payer: Self-pay | Admitting: Cardiology

## 2015-01-05 ENCOUNTER — Inpatient Hospital Stay (HOSPITAL_COMMUNITY)
Admission: EM | Admit: 2015-01-05 | Discharge: 2015-01-11 | DRG: 236 | Disposition: A | Discharge status: Home / Self Care | Payer: Medicare Other | Attending: Cardiothoracic Surgery | Admitting: Cardiothoracic Surgery

## 2015-01-05 ENCOUNTER — Emergency Department (HOSPITAL_COMMUNITY): Payer: Medicare Other

## 2015-01-05 ENCOUNTER — Emergency Department (HOSPITAL_BASED_OUTPATIENT_CLINIC_OR_DEPARTMENT_OTHER): Admit: 2015-01-05 | Discharge: 2015-01-05 | Disposition: A | Discharge status: Home / Self Care | Payer: Medicare Other

## 2015-01-05 ENCOUNTER — Encounter (HOSPITAL_COMMUNITY): Payer: Self-pay | Admitting: Family Medicine

## 2015-01-05 DIAGNOSIS — Z885 Allergy status to narcotic agent status: Secondary | ICD-10-CM | POA: Diagnosis not present

## 2015-01-05 DIAGNOSIS — Z8249 Family history of ischemic heart disease and other diseases of the circulatory system: Secondary | ICD-10-CM | POA: Diagnosis not present

## 2015-01-05 DIAGNOSIS — D62 Acute posthemorrhagic anemia: Secondary | ICD-10-CM | POA: Diagnosis not present

## 2015-01-05 DIAGNOSIS — Z951 Presence of aortocoronary bypass graft: Secondary | ICD-10-CM

## 2015-01-05 DIAGNOSIS — I25110 Atherosclerotic heart disease of native coronary artery with unstable angina pectoris: Secondary | ICD-10-CM | POA: Diagnosis not present

## 2015-01-05 DIAGNOSIS — Z7982 Long term (current) use of aspirin: Secondary | ICD-10-CM | POA: Diagnosis not present

## 2015-01-05 DIAGNOSIS — R079 Chest pain, unspecified: Secondary | ICD-10-CM | POA: Diagnosis not present

## 2015-01-05 DIAGNOSIS — I2582 Chronic total occlusion of coronary artery: Secondary | ICD-10-CM | POA: Diagnosis present

## 2015-01-05 DIAGNOSIS — I739 Peripheral vascular disease, unspecified: Secondary | ICD-10-CM | POA: Diagnosis present

## 2015-01-05 DIAGNOSIS — E119 Type 2 diabetes mellitus without complications: Secondary | ICD-10-CM | POA: Diagnosis present

## 2015-01-05 DIAGNOSIS — Z01818 Encounter for other preprocedural examination: Secondary | ICD-10-CM

## 2015-01-05 DIAGNOSIS — I10 Essential (primary) hypertension: Secondary | ICD-10-CM | POA: Diagnosis present

## 2015-01-05 DIAGNOSIS — Z7902 Long term (current) use of antithrombotics/antiplatelets: Secondary | ICD-10-CM | POA: Diagnosis not present

## 2015-01-05 DIAGNOSIS — I480 Paroxysmal atrial fibrillation: Secondary | ICD-10-CM | POA: Diagnosis not present

## 2015-01-05 DIAGNOSIS — Z79899 Other long term (current) drug therapy: Secondary | ICD-10-CM

## 2015-01-05 DIAGNOSIS — I2510 Atherosclerotic heart disease of native coronary artery without angina pectoris: Secondary | ICD-10-CM | POA: Diagnosis present

## 2015-01-05 DIAGNOSIS — I2584 Coronary atherosclerosis due to calcified coronary lesion: Secondary | ICD-10-CM | POA: Diagnosis present

## 2015-01-05 DIAGNOSIS — Z955 Presence of coronary angioplasty implant and graft: Secondary | ICD-10-CM | POA: Diagnosis not present

## 2015-01-05 DIAGNOSIS — E785 Hyperlipidemia, unspecified: Secondary | ICD-10-CM | POA: Diagnosis present

## 2015-01-05 DIAGNOSIS — Z888 Allergy status to other drugs, medicaments and biological substances status: Secondary | ICD-10-CM

## 2015-01-05 DIAGNOSIS — M79609 Pain in unspecified limb: Secondary | ICD-10-CM

## 2015-01-05 DIAGNOSIS — S7011XA Contusion of right thigh, initial encounter: Secondary | ICD-10-CM | POA: Diagnosis present

## 2015-01-05 DIAGNOSIS — E8779 Other fluid overload: Secondary | ICD-10-CM | POA: Diagnosis not present

## 2015-01-05 DIAGNOSIS — I200 Unstable angina: Secondary | ICD-10-CM | POA: Diagnosis not present

## 2015-01-05 LAB — URINALYSIS, ROUTINE W REFLEX MICROSCOPIC
Bilirubin Urine: NEGATIVE
Glucose, UA: NEGATIVE mg/dL
Hgb urine dipstick: NEGATIVE
Ketones, ur: NEGATIVE mg/dL
Leukocytes, UA: NEGATIVE
Nitrite: NEGATIVE
Protein, ur: NEGATIVE mg/dL
Specific Gravity, Urine: 1.006 (ref 1.005–1.030)
Urobilinogen, UA: 0.2 mg/dL (ref 0.0–1.0)
pH: 5 (ref 5.0–8.0)

## 2015-01-05 LAB — CBC WITH DIFFERENTIAL/PLATELET
Basophils Absolute: 0.1 10*3/uL (ref 0.0–0.1)
Basophils Relative: 1 % (ref 0–1)
EOS ABS: 0.2 10*3/uL (ref 0.0–0.7)
Eosinophils Relative: 2 % (ref 0–5)
HCT: 36.1 % (ref 36.0–46.0)
Hemoglobin: 12.5 g/dL (ref 12.0–15.0)
LYMPHS ABS: 1.7 10*3/uL (ref 0.7–4.0)
Lymphocytes Relative: 20 % (ref 12–46)
MCH: 28.9 pg (ref 26.0–34.0)
MCHC: 34.6 g/dL (ref 30.0–36.0)
MCV: 83.4 fL (ref 78.0–100.0)
MONO ABS: 0.7 10*3/uL (ref 0.1–1.0)
Monocytes Relative: 8 % (ref 3–12)
Neutro Abs: 5.9 10*3/uL (ref 1.7–7.7)
Neutrophils Relative %: 69 % (ref 43–77)
Platelets: 304 10*3/uL (ref 150–400)
RBC: 4.33 MIL/uL (ref 3.87–5.11)
RDW: 13.2 % (ref 11.5–15.5)
WBC: 8.6 10*3/uL (ref 4.0–10.5)

## 2015-01-05 LAB — COMPREHENSIVE METABOLIC PANEL
ALT: 26 U/L (ref 14–54)
ANION GAP: 9 (ref 5–15)
AST: 35 U/L (ref 15–41)
Albumin: 3.8 g/dL (ref 3.5–5.0)
Alkaline Phosphatase: 122 U/L (ref 38–126)
BILIRUBIN TOTAL: 1 mg/dL (ref 0.3–1.2)
BUN: 14 mg/dL (ref 6–20)
CO2: 28 mmol/L (ref 22–32)
Calcium: 9.6 mg/dL (ref 8.9–10.3)
Chloride: 98 mmol/L — ABNORMAL LOW (ref 101–111)
Creatinine, Ser: 0.79 mg/dL (ref 0.44–1.00)
GFR calc non Af Amer: >60 mL/min (ref >60)
Glucose, Bld: 183 mg/dL — ABNORMAL HIGH (ref 65–99)
Potassium: 4.8 mmol/L (ref 3.5–5.1)
SODIUM: 135 mmol/L (ref 135–145)
TOTAL PROTEIN: 7.4 g/dL (ref 6.5–8.1)

## 2015-01-05 LAB — TROPONIN I: Troponin I: <0.03 ng/mL (ref <0.031)

## 2015-01-05 LAB — GLUCOSE, CAPILLARY: Glucose-Capillary: 184 mg/dL — ABNORMAL HIGH (ref 65–99)

## 2015-01-05 MED ORDER — ZOLPIDEM TARTRATE 5 MG PO TABS
5.0000 mg | ORAL_TABLET | Freq: Every evening | ORAL | Status: DC | PRN
  Administered 2015-01-06 – 2015-01-07 (×2): 5 mg via ORAL
  Filled 2015-01-05 (×2): qty 1

## 2015-01-05 MED ORDER — NITROGLYCERIN 0.4 MG SL SUBL: 0.4000 mg | SUBLINGUAL_TABLET | SUBLINGUAL | Status: DC | PRN

## 2015-01-05 MED ORDER — ACETAMINOPHEN 500 MG PO TABS: 1000.0000 mg | ORAL_TABLET | Freq: Four times a day (QID) | ORAL | Status: DC | PRN

## 2015-01-05 MED ORDER — LINAGLIPTIN 5 MG PO TABS
5.0000 mg | ORAL_TABLET | Freq: Every day | ORAL | Status: DC
  Administered 2015-01-06: 5 mg via ORAL
  Filled 2015-01-05 (×2): qty 1

## 2015-01-05 MED ORDER — SODIUM CHLORIDE 0.9 % IV SOLN
INTRAVENOUS | Status: DC
  Administered 2015-01-05: 19:00:00 via INTRAVENOUS

## 2015-01-05 MED ORDER — ROSUVASTATIN CALCIUM 20 MG PO TABS
20.0000 mg | ORAL_TABLET | Freq: Every day | ORAL | Status: DC
Start: 2015-01-06 — End: 2015-01-11
  Administered 2015-01-06 – 2015-01-11 (×5): 20 mg via ORAL
  Filled 2015-01-05 (×6): qty 1

## 2015-01-05 MED ORDER — ASPIRIN EC 81 MG PO TBEC
81.0000 mg | DELAYED_RELEASE_TABLET | Freq: Every day | ORAL | Status: DC
  Administered 2015-01-06: 81 mg via ORAL
  Filled 2015-01-05 (×2): qty 1

## 2015-01-05 MED ORDER — SODIUM CHLORIDE 0.9 % IV SOLN
INTRAVENOUS | Status: DC
  Administered 2015-01-05: 23:00:00 via INTRAVENOUS

## 2015-01-05 MED ORDER — ZOLPIDEM TARTRATE 5 MG PO TABS: 10.0000 mg | ORAL_TABLET | Freq: Every evening | ORAL | Status: DC | PRN

## 2015-01-05 MED ORDER — METOPROLOL TARTRATE 1 MG/ML IV SOLN
5.0000 mg | Freq: Once | INTRAVENOUS | Status: AC
  Administered 2015-01-05: 5 mg via INTRAVENOUS
  Filled 2015-01-05 (×2): qty 5

## 2015-01-05 MED ORDER — ALPRAZOLAM 0.25 MG PO TABS
0.2500 mg | ORAL_TABLET | Freq: Two times a day (BID) | ORAL | Status: DC | PRN
  Filled 2015-01-05 (×2): qty 1

## 2015-01-05 MED ORDER — GUAIFENESIN ER 600 MG PO TB12
600.0000 mg | ORAL_TABLET | Freq: Every day | ORAL | Status: DC
  Administered 2015-01-06: 600 mg via ORAL
  Filled 2015-01-05 (×2): qty 1

## 2015-01-05 MED ORDER — ONDANSETRON HCL 4 MG/2ML IJ SOLN: 4.0000 mg | Freq: Four times a day (QID) | INTRAMUSCULAR | Status: DC | PRN

## 2015-01-05 MED ORDER — ACETAMINOPHEN 325 MG PO TABS
650.0000 mg | ORAL_TABLET | ORAL | Status: DC | PRN
  Administered 2015-01-06: 650 mg via ORAL
  Filled 2015-01-05: qty 2

## 2015-01-05 MED ORDER — HEPARIN (PORCINE) IN NACL 100-0.45 UNIT/ML-% IJ SOLN
1000.0000 [IU]/h | INTRAMUSCULAR | Status: DC
  Administered 2015-01-05: 1000 [IU]/h via INTRAVENOUS
  Filled 2015-01-05: qty 250

## 2015-01-05 MED ORDER — ATENOLOL 100 MG PO TABS
100.0000 mg | ORAL_TABLET | Freq: Every day | ORAL | Status: DC
Start: 2015-01-06 — End: 2015-01-07
  Administered 2015-01-06: 100 mg via ORAL
  Filled 2015-01-05 (×2): qty 1

## 2015-01-05 MED ORDER — INSULIN ASPART 100 UNIT/ML ~~LOC~~ SOLN
0.0000 [IU] | Freq: Three times a day (TID) | SUBCUTANEOUS | Status: DC
Start: 2015-01-06 — End: 2015-01-07
  Administered 2015-01-06: 3 [IU] via SUBCUTANEOUS
  Administered 2015-01-06: 5 [IU] via SUBCUTANEOUS
  Administered 2015-01-06: 2 [IU] via SUBCUTANEOUS

## 2015-01-05 MED ORDER — NITROGLYCERIN IN D5W 200-5 MCG/ML-% IV SOLN
10.0000 ug/min | INTRAVENOUS | Status: DC
  Administered 2015-01-05: 10 ug/min via INTRAVENOUS
  Filled 2015-01-05 (×2): qty 250

## 2015-01-05 NOTE — Progress Notes (Signed)
VASCULAR LAB PRELIMINARY  PRELIMINARY  PRELIMINARY  PRELIMINARY  Right lower extremity duplex completed.    Preliminary report:  There is no DVT or SVT noted in the right lower extremity.  There is no pseudoaneurysm noted. There is a small hematoma noted.  Tatanisha Cuthbert, RVT 01/05/2015, 6:28 PM

## 2015-01-05 NOTE — Progress Notes (Signed)
ANTICOAGULATION CONSULT NOTE - Initial Consult  Pharmacy Consult for heparin Indication: ACS/STEMI  Allergies  Allergen Reactions  . Oxycodone Hcl Anaphylaxis and Other (See Comments)    Palpitations, difficulty breathing.  . Atorvastatin Cough  . Codeine Nausea Only and Other (See Comments)    Constipates; doesn't like the way it makes her feel     Patient Measurements:   Heparin Dosing Weight: 80.7 kg TBW = 82.6 kg  Vital Signs: Temp: 97.6 F (36.4 C) (05/29 1537) Temp Source: Oral (05/29 1537) BP: 142/65 mmHg (05/29 2200) Pulse Rate: 64 (05/29 2145)  Labs:  Recent Labs  01/05/15 1640  HGB 12.5  HCT 36.1  PLT 304  CREATININE 0.79  TROPONINI <0.03    Estimated Creatinine Clearance: 81.1 mL/min (by C-G formula based on Cr of 0.79).   Medical History: Past Medical History  Diagnosis Date  . Type 2 diabetes mellitus   . Hyperlipidemia   . Hypertension   . Coronary artery disease     Status post coronary stenting approximately 10 years ago  . Peripheral artery disease      Assessment: 64 yo F presenting with CP and R thigh ecchymosis at cath groin site.  S/p cardiac cath 01/03/15. Pt with ecchymosis of R thigh.  MD had request pharmacy to dose "low dose" heparin for ACS/STEMI with no bolus and keep at low end of therapeutic range. Wt 82.6 kg, HDW 80.7 kg; creat 0.79; H/H 12.5/36.1 pltc 304.  To stop plavix for CABG consult.   Goal of Therapy:  Heparin level 0.3 - 0.5 units/ml Monitor platelets by anticoagulation protocol: Yes   Plan:  -no bolus per MD request -heparin at 1000 units/hr -check HL 8 hrs after start -daily HL and CBC  Herby AbrahamMichelle T. Nakiesha Rumsey, Pharm.D. [phone] 01/05/2015 10:25 PM

## 2015-01-05 NOTE — ED Notes (Addendum)
Pt here for possible infection to right thigh from previous cardiac catheter. sts very bruised and she feels like there is fluid in there. sts warm to touch. sts she is suppose to have CABG. sts she had an episode of chest pain this am and took ASA and nitro. sts pain radiated down left arm. Denies any currently.

## 2015-01-05 NOTE — H&P (Signed)
[name] is an 64 y.o. female.    Primary Cardiologist:Dr. Lindajo RoyalGollin   PCP: Marguarite ArbourSPARKS,JEFFREY D, MD  Chief Complaint: came for cath site check but then described chest pain she had this AM relief with NTG.   HPI:   64 y.o. female with PMHx s/f CAD (s/p multiple caths, PCI-prox/mid RCA in 2006), h/o atrial fibrillation, DM2, HTN, HLD who was recently seen by Dr. Mariah MillingGollan for anginal symptoms.  Plans were made for cardiac cath which she had on 01/03/15.    Cardiac cath:  Dist LAD lesion, 70% stenosed.  Prox LAD lesion, 75% stenosed.  Ost Cx lesion, 80% stenosed.  Dist Cx lesion, 80% stenosed.  Prox RCA lesion, 80% stenosed.  Mid RCA lesion, 100% stenosed.  Occluded proximal RCA at site of old stent, small PDA/PL branch filled via collaterals from left to right and right to right. Severe proximal and mid LCX and LAD disease. Diffuse calcification, moderate. Small moderate sized vessels.  Hypokinesis of the basal to mid inferior wall, EF 50%. Possible aneurysmal area. Given the extent of calcifications, diffuse disease with significant severity of all three vessels, She was to be  referred to CT surgery for consideration for CABG (LIMA to LAD, SVG to the PDA, SVG to OM)   Dr Norina BuzzardPvT has reviewed the films  Today she called with ecchymosis of rt thigh.  She reported it was tender to touch as well.  She was instructed to come to ER for eval.  Here in ER she admitted to episode of chest pain this AM and took ASA and SL NTG with improvement.    EKG SR without acute changes. BP is elevated at 183/59 and higher.  She has some headache.  She is on Plavix.  Dr. Graciela HusbandsKlein talked with Dr. Maren BeachVanTrigt and we will hold plavix.       Past Medical History  Diagnosis Date  . Type 2 diabetes mellitus   . Hyperlipidemia   . Hypertension   . Coronary artery disease     Status post coronary stenting approximately 10 years ago  . Peripheral artery disease     Past Surgical History  Procedure  Laterality Date  . Tonsillectomy and adenoidectomy    . Cesarean section    . Transluminal atherectomy tibial artery  01/30/2013  . Transluminal angioplasty  01/30/2013    L posterior tibial artery, L tibioperoneal trunk, L SFA  . Appendectomy    . Cardiac catheterization N/A 01/03/2015    Procedure: Left Heart Cath;  Surgeon: Antonieta Ibaimothy J Gollan, MD;  Location: [facility] INVASIVE CV LAB;  Service: Cardiovascular;  Laterality: N/A;    Family History  Problem Relation Age of Onset  . Hypertension Mother   . Cancer Father    Social History:  reports that she has never smoked. She has never used smokeless tobacco. She reports that she does not drink alcohol or use illicit drugs.  Allergies:  Allergies  Allergen Reactions  . Oxycodone Hcl Anaphylaxis and Other (See Comments)    Palpitations, difficulty breathing.  . Atorvastatin Cough  . Codeine Nausea Only and Other (See Comments)    Constipates; doesn't like the way it makes her feel     OUTPT MEDS: No current facility-administered medications on file prior to encounter.   Current Outpatient Prescriptions on File Prior to Encounter  Medication Sig Dispense Refill  . aspirin 325 MG tablet Take 325 mg by mouth daily.      .Marland Kitchen  atenolol (TENORMIN) 100 MG tablet Take 100 mg by mouth daily.      . clopidogrel (PLAVIX) 75 MG tablet Take 75 mg by mouth daily.      . furosemide (LASIX) 20 MG tablet Take 1 tablet (20 mg total) by mouth daily as needed. (Patient taking differently: Take 20 mg by mouth daily as needed for fluid. ) 90 tablet 3  . isosorbide mononitrate (IMDUR) 60 MG 24 hr tablet Take 30 mg by mouth 2 (two) times daily. Breaks into halves    . nitroGLYCERIN (NITROSTAT) 0.4 MG SL tablet Place 1 tablet (0.4 mg total) under the tongue every 5 (five) minutes as needed for chest pain. 25 tablet 3  . rosuvastatin (CRESTOR) 20 MG tablet Take 1 tablet (20 mg total) by mouth daily. 30 tablet 6  . sitaGLIPtin (JANUVIA) 100 MG tablet Take 100 mg  by mouth daily.      Marland Kitchen zolpidem (AMBIEN) 10 MG tablet Take 10 mg by mouth at bedtime as needed.         ROS: General:no colds or fevers, no weight changes Skin:no rashes or ulcers HEENT:no blurred vision, no congestion CV:see HPI PUL:see HPI GI:no diarrhea constipation or melena, no indigestion GU:no hematuria, no dysuria MS:no joint pain, + claudication, rt thigh pain after cath Neuro:no syncope, no lightheadedness Endo:+ diabetes, no thyroid disease   Blood pressure 183/59, pulse 67, temperature 97.6 F (36.4 C), temperature source Oral, resp. rate 16, SpO2 98 %.      Alert and oriented in no acute distress HENT- normal Eyes- EOMI, without scleral icterus Skin- warm and dry; without rashes LN-neg Neck- supple without thyromegaly, JVP-flat, carotids brisk and full without bruits Back-without CVAT or kyphosis Lungs-clear to auscultation CV-Regular rate and rhythm, nl S1 and S2, no murmurs gallops or rubs, S4-absent Groin with ecchymosis but no hematoma and no bruit Abd-soft with active bowel sounds; no midline pulsation or hepatomegaly Pulses-intact femoral and distal MKS-without gross deformity Neuro- Ax O, CN3-12 intact, grossly normal motor and sensory function Affect engaging   Assessment/Plan Principal Problem:   Unstable angina- Dr. Graciela Husbands has seen and discussed with Dr. Donata Clay.  Will begin low dose IV Heparin, no bolus, and keep at low end of therapeutic, stop plavix,  Serial troponin.  Also IV metoprolol to control BP  Active Problems:   CAD (coronary artery disease), with need for CABG, cath 01/03/15     Type 2 diabetes mellitus- SSI   PAF (paroxysmal atrial fibrillation)- maintaining SR    PAD (peripheral artery disease)- will need dopplers   Hyperlipidemia- on statin   Hypertension- poorly controlled, IV NTG drip, current meds with 1 dose of IV metoprolol.  Thigh ecchymosis- check femoral doppler in am     [facility] R Nurse Practitioner  Certified [facility] Pager [phone] or after 5pm or weekends call [phone] 01/05/2015, 5:34 PM    PT  WITH  Recurring chest pain in the setting of severe 3VCAD following cath.  There is some modest ecchymosis but no significant hematoma and no bruit.  With CP will stop plavix and begin heparin without bolus and low target Xa level   Will use adjunctive NTG given BP and also prn IV betablockers  Femoral doppler  TCTS to see and anticipate CABG this week  noteably her daughter is in the midst of some social stress issues which creates anxiety for her mom

## 2015-01-05 NOTE — ED Notes (Signed)
Pt has returned from being out of the department; pt placed back on monitor, continuous pulse oximetry and blood pressure cuff 

## 2015-01-05 NOTE — Telephone Encounter (Signed)
Pt calling with complaints of bruising at cath site, Her cath was 01/03/15.  She has + bruising in rt thigh and painful to touch.  I asked her to [facility] or [facility] to be evaluated.  Concern for amount of blood loss.  She has no chest pain and no dizziness.

## 2015-01-05 NOTE — ED Provider Notes (Signed)
CSN: [id number]     Arrival date & time 01/05/15  1525 History   First MD Initiated Contact with Patient 01/05/15 1549     Chief Complaint  Patient presents with  . Wound Check     (Consider location/radiation/quality/duration/timing/severity/associated sxs/prior Treatment) HPI Comments: Patient here complaining of swelling and pain to her right groin as well as her right thigh. Had cardiac catheterization last week and was diagnosed with triple-vessel disease and is scheduled to be seen by cardiothoracic surgeon in your future. She also notes increasing exertional chest pain which radiates to her left arm and left jaw with associated diaphoresis and dyspnea. No symptoms are relieved with nitroglycerin. The frequency of those symptoms have been has been increasing. She notes associated palpitations as well as some subjective bradycardia.  The history is provided by the patient.    Past Medical History  Diagnosis Date  . Type 2 diabetes mellitus   . Hyperlipidemia   . Hypertension   . Coronary artery disease     Status post coronary stenting approximately 10 years ago  . Peripheral artery disease    Past Surgical History  Procedure Laterality Date  . Tonsillectomy and adenoidectomy    . Cesarean section    . Transluminal atherectomy tibial artery  01/30/2013  . Transluminal angioplasty  01/30/2013    L posterior tibial artery, L tibioperoneal trunk, L SFA  . Appendectomy    . Cardiac catheterization N/A 01/03/2015    Procedure: Left Heart Cath;  Surgeon: Antonieta Iba, MD;  Location: [facility] INVASIVE CV LAB;  Service: Cardiovascular;  Laterality: N/A;   Family History  Problem Relation Age of Onset  . Hypertension Mother    History  Substance Use Topics  . Smoking status: Never Smoker   . Smokeless tobacco: Never Used  . Alcohol Use: No   OB History    No data available     Review of Systems  All other systems reviewed and are negative.     Allergies  Oxycodone  hcl and Codeine  Home Medications   Prior to Admission medications   Medication Sig Start Date End Date Taking? Authorizing Provider  aspirin 325 MG tablet Take 325 mg by mouth daily.      Historical Provider, MD  atenolol (TENORMIN) 100 MG tablet Take 100 mg by mouth daily.      Historical Provider, MD  clopidogrel (PLAVIX) 75 MG tablet Take 75 mg by mouth daily.      Historical Provider, MD  furosemide (LASIX) 20 MG tablet Take 1 tablet (20 mg total) by mouth daily as needed. 12/27/14   Antonieta Iba, MD  isosorbide mononitrate (IMDUR) 60 MG 24 hr tablet Take 60 mg by mouth daily.      Historical Provider, MD  nitroGLYCERIN (NITROSTAT) 0.4 MG SL tablet Place 1 tablet (0.4 mg total) under the tongue every 5 (five) minutes as needed for chest pain. 12/27/14   Antonieta Iba, MD  rosuvastatin (CRESTOR) 20 MG tablet Take 1 tablet (20 mg total) by mouth daily. 01/03/15   Antonieta Iba, MD  sitaGLIPtin (JANUVIA) 100 MG tablet Take 100 mg by mouth daily.      Historical Provider, MD  zolpidem (AMBIEN) 10 MG tablet Take 10 mg by mouth at bedtime as needed.      Historical Provider, MD   BP 183/59 mmHg  Pulse 67  Temp(Src) 97.6 F (36.4 C) (Oral)  Resp 16  SpO2 98% Physical Exam  Constitutional: She is  oriented to person, place, and time. She appears well-developed and well-nourished.  Non-toxic appearance. No distress.  HENT:  Head: Normocephalic and atraumatic.  Eyes: Conjunctivae, EOM and lids are normal. Pupils are equal, round, and reactive to light.  Neck: Normal range of motion. Neck supple. No tracheal deviation present. No thyroid mass present.  Cardiovascular: Normal rate, regular rhythm and normal heart sounds.  Exam reveals no gallop.   No murmur heard. Pulmonary/Chest: Effort normal and breath sounds normal. No stridor. No respiratory distress. She has no decreased breath sounds. She has no wheezes. She has no rhonchi. She has no rales.  Abdominal: Soft. Normal appearance  and bowel sounds are normal. She exhibits no distension. There is no tenderness. There is no rebound and no CVA tenderness.  Musculoskeletal: Normal range of motion. She exhibits no edema or tenderness.       Legs: Neurological: She is alert and oriented to person, place, and time. She has normal strength. No cranial nerve deficit or sensory deficit. GCS eye subscore is 4. GCS verbal subscore is 5. GCS motor subscore is 6.  Skin: Skin is warm and dry. No abrasion and no rash noted.  Psychiatric: She has a normal mood and affect. Her speech is normal and behavior is normal.  Nursing note and vitals reviewed.   ED Course  Procedures (including critical care time) Labs Review Labs Reviewed  TROPONIN I  CBC WITH DIFFERENTIAL/PLATELET  COMPREHENSIVE METABOLIC PANEL    Imaging Review No results found.   EKG Interpretation   Date/Time:  Sunday Jan 05 2015 15:47:45 EDT Ventricular Rate:  67 PR Interval:  154 QRS Duration: 98 QT Interval:  428 QTC Calculation: 452 R Axis:   67 Text Interpretation:  Sinus rhythm with occasional Premature ventricular  complexes Nonspecific ST abnormality Abnormal ECG No significant change  since last tracing Confirmed by Jhan Conery  MD, Zionah Criswell ([id number]) on 01/05/2015  4:20:25 PM      MDM   Final diagnoses:  Chest pain    Patient is currently chest pain-free at this time. Have ordered a Doppler of her right le to evaluate for DVT versus pseudoaneurysm. I spoke to the cardiothoracic surgeon on call and he recommends patient be admitted to cardiology he will see the patient consultation and schedule her bypass surgery for next week    Lorre NickAnthony Donnisha Besecker, MD 01/05/15 1625

## 2015-01-06 ENCOUNTER — Other Ambulatory Visit (HOSPITAL_COMMUNITY): Payer: Medicare Other

## 2015-01-06 ENCOUNTER — Inpatient Hospital Stay (HOSPITAL_COMMUNITY): Payer: Medicare Other

## 2015-01-06 DIAGNOSIS — I2510 Atherosclerotic heart disease of native coronary artery without angina pectoris: Secondary | ICD-10-CM

## 2015-01-06 DIAGNOSIS — I25110 Atherosclerotic heart disease of native coronary artery with unstable angina pectoris: Secondary | ICD-10-CM

## 2015-01-06 DIAGNOSIS — R079 Chest pain, unspecified: Secondary | ICD-10-CM

## 2015-01-06 LAB — BLOOD GAS, ARTERIAL
Acid-Base Excess: 0.9 mmol/L (ref 0.0–2.0)
Bicarbonate: 24.5 mEq/L — ABNORMAL HIGH (ref 20.0–24.0)
Drawn by: 365271
FIO2: 0.21 %
O2 Saturation: 97.5 %
Patient temperature: 98.6
TCO2: 25.5 mmol/L (ref 0–100)
pCO2 arterial: 35.2 mmHg (ref 35.0–45.0)
pH, Arterial: 7.457 — ABNORMAL HIGH (ref 7.350–7.450)
pO2, Arterial: 91.2 mmHg (ref 80.0–100.0)

## 2015-01-06 LAB — TROPONIN I: Troponin I: <0.03 ng/mL (ref <0.031)

## 2015-01-06 LAB — CBC
HEMATOCRIT: 31.4 % — AB (ref 36.0–46.0)
Hemoglobin: 11 g/dL — ABNORMAL LOW (ref 12.0–15.0)
MCH: 29.1 pg (ref 26.0–34.0)
MCHC: 35 g/dL (ref 30.0–36.0)
MCV: 83.1 fL (ref 78.0–100.0)
PLATELETS: 255 10*3/uL (ref 150–400)
RBC: 3.78 MIL/uL — ABNORMAL LOW (ref 3.87–5.11)
RDW: 13.2 % (ref 11.5–15.5)
WBC: 6 10*3/uL (ref 4.0–10.5)

## 2015-01-06 LAB — BASIC METABOLIC PANEL
Anion gap: 5 (ref 5–15)
BUN: 11 mg/dL (ref 6–20)
CALCIUM: 8.9 mg/dL (ref 8.9–10.3)
CO2: 30 mmol/L (ref 22–32)
Chloride: 101 mmol/L (ref 101–111)
Creatinine, Ser: 0.72 mg/dL (ref 0.44–1.00)
GFR calc Af Amer: >60 mL/min (ref >60)
Glucose, Bld: 224 mg/dL — ABNORMAL HIGH (ref 65–99)
Potassium: 3.7 mmol/L (ref 3.5–5.1)
Sodium: 136 mmol/L (ref 135–145)

## 2015-01-06 LAB — LIPID PANEL
Cholesterol: 161 mg/dL (ref 0–200)
HDL: 41 mg/dL (ref >40)
LDL Cholesterol: 89 mg/dL (ref 0–99)
TRIGLYCERIDES: 154 mg/dL — AB (ref <150)
Total CHOL/HDL Ratio: 3.9 RATIO
VLDL: 31 mg/dL (ref 0–40)

## 2015-01-06 LAB — HEPARIN LEVEL (UNFRACTIONATED): HEPARIN UNFRACTIONATED: 0.1 [IU]/mL — AB (ref 0.30–0.70)

## 2015-01-06 LAB — ABO/RH: ABO/RH(D): O POS

## 2015-01-06 LAB — GLUCOSE, CAPILLARY
GLUCOSE-CAPILLARY: 216 mg/dL — AB (ref 65–99)
Glucose-Capillary: 196 mg/dL — ABNORMAL HIGH (ref 65–99)
Glucose-Capillary: 231 mg/dL — ABNORMAL HIGH (ref 65–99)
Glucose-Capillary: 237 mg/dL — ABNORMAL HIGH (ref 65–99)

## 2015-01-06 LAB — SURGICAL PCR SCREEN
MRSA, PCR: NEGATIVE
Staphylococcus aureus: POSITIVE — AB

## 2015-01-06 LAB — T4, FREE: FREE T4: 0.89 ng/dL (ref 0.61–1.12)

## 2015-01-06 LAB — PROTIME-INR
INR: 1.15 (ref 0.00–1.49)
Prothrombin Time: 14.9 seconds (ref 11.6–15.2)

## 2015-01-06 LAB — TSH: TSH: 5.38 u[IU]/mL — ABNORMAL HIGH (ref 0.350–4.500)

## 2015-01-06 LAB — PLATELET INHIBITION P2Y12: Platelet Function  P2Y12: 262 [PRU] (ref 194–418)

## 2015-01-06 LAB — PREPARE RBC (CROSSMATCH)

## 2015-01-06 LAB — MAGNESIUM: MAGNESIUM: 1.5 mg/dL — AB (ref 1.7–2.4)

## 2015-01-06 LAB — MRSA PCR SCREENING: MRSA by PCR: NEGATIVE

## 2015-01-06 MED ORDER — DOPAMINE-DEXTROSE 3.2-5 MG/ML-% IV SOLN
0.0000 ug/kg/min | INTRAVENOUS | Status: DC
  Filled 2015-01-06: qty 250

## 2015-01-06 MED ORDER — SODIUM CHLORIDE 0.9 % IV SOLN
INTRAVENOUS | Status: AC
  Administered 2015-01-07: 3.4 [IU]/h via INTRAVENOUS
  Filled 2015-01-06: qty 2.5

## 2015-01-06 MED ORDER — ALPRAZOLAM 0.5 MG PO TABS
0.5000 mg | ORAL_TABLET | Freq: Two times a day (BID) | ORAL | Status: DC
  Administered 2015-01-06 (×2): 0.5 mg via ORAL
  Filled 2015-01-06: qty 1

## 2015-01-06 MED ORDER — PHENYLEPHRINE HCL 10 MG/ML IJ SOLN
30.0000 ug/min | INTRAVENOUS | Status: AC
  Administered 2015-01-07: 10 ug/min via INTRAVENOUS
  Filled 2015-01-06: qty 2

## 2015-01-06 MED ORDER — CHLORHEXIDINE GLUCONATE 4 % EX LIQD
60.0000 mL | Freq: Once | CUTANEOUS | Status: AC
  Administered 2015-01-07: 4 via TOPICAL
  Filled 2015-01-06: qty 60

## 2015-01-06 MED ORDER — METOPROLOL TARTRATE 12.5 MG HALF TABLET
12.5000 mg | ORAL_TABLET | Freq: Once | ORAL | Status: AC
  Administered 2015-01-07: 12.5 mg via ORAL
  Filled 2015-01-06 (×2): qty 1

## 2015-01-06 MED ORDER — DEXMEDETOMIDINE HCL IN NACL 400 MCG/100ML IV SOLN
0.1000 ug/kg/h | INTRAVENOUS | Status: AC
  Administered 2015-01-07: .3 ug/kg/h via INTRAVENOUS
  Filled 2015-01-06: qty 100

## 2015-01-06 MED ORDER — DEXTROSE 5 % IV SOLN
750.0000 mg | INTRAVENOUS | Status: DC
  Filled 2015-01-06: qty 750

## 2015-01-06 MED ORDER — BISACODYL 5 MG PO TBEC
5.0000 mg | DELAYED_RELEASE_TABLET | Freq: Once | ORAL | Status: DC
  Filled 2015-01-06: qty 1

## 2015-01-06 MED ORDER — HEPARIN (PORCINE) IN NACL 100-0.45 UNIT/ML-% IJ SOLN
1400.0000 [IU]/h | INTRAMUSCULAR | Status: DC
  Administered 2015-01-06: 1400 [IU]/h via INTRAVENOUS
  Administered 2015-01-06: 1200 [IU]/h via INTRAVENOUS
  Filled 2015-01-06 (×3): qty 250

## 2015-01-06 MED ORDER — POTASSIUM CHLORIDE 2 MEQ/ML IV SOLN
80.0000 meq | INTRAVENOUS | Status: DC
  Filled 2015-01-06: qty 40

## 2015-01-06 MED ORDER — HEPARIN SODIUM (PORCINE) 1000 UNIT/ML IJ SOLN
INTRAMUSCULAR | Status: DC
  Filled 2015-01-06: qty 30

## 2015-01-06 MED ORDER — NITROGLYCERIN IN D5W 200-5 MCG/ML-% IV SOLN
2.0000 ug/min | INTRAVENOUS | Status: DC
  Filled 2015-01-06: qty 250

## 2015-01-06 MED ORDER — CHLORHEXIDINE GLUCONATE CLOTH 2 % EX PADS
6.0000 | MEDICATED_PAD | Freq: Every day | CUTANEOUS | Status: AC
Start: 2015-01-06 — End: 2015-01-11
  Administered 2015-01-06 – 2015-01-09 (×3): 6 via TOPICAL

## 2015-01-06 MED ORDER — MUPIROCIN 2 % EX OINT
1.0000 "application " | TOPICAL_OINTMENT | Freq: Two times a day (BID) | CUTANEOUS | Status: DC
  Administered 2015-01-06 (×2): 1 via NASAL
  Filled 2015-01-06: qty 22

## 2015-01-06 MED ORDER — DIAZEPAM 5 MG PO TABS
5.0000 mg | ORAL_TABLET | Freq: Once | ORAL | Status: AC
Start: 2015-01-07 — End: 2015-01-07
  Administered 2015-01-07: 5 mg via ORAL
  Filled 2015-01-06: qty 1

## 2015-01-06 MED ORDER — SODIUM CHLORIDE 0.9 % IV SOLN
INTRAVENOUS | Status: AC
  Administered 2015-01-07: 69.8 mL/h via INTRAVENOUS
  Filled 2015-01-06: qty 40

## 2015-01-06 MED ORDER — MAGNESIUM SULFATE IN D5W 10-5 MG/ML-% IV SOLN
1.0000 g | Freq: Once | INTRAVENOUS | Status: AC
  Administered 2015-01-06: 1 g via INTRAVENOUS
  Filled 2015-01-06: qty 100

## 2015-01-06 MED ORDER — CHLORHEXIDINE GLUCONATE 4 % EX LIQD
60.0000 mL | Freq: Once | CUTANEOUS | Status: AC
  Administered 2015-01-06: 4 via TOPICAL
  Filled 2015-01-06: qty 60

## 2015-01-06 MED ORDER — DEXTROSE 5 % IV SOLN
1.5000 g | INTRAVENOUS | Status: AC
  Administered 2015-01-07: 1.5 g via INTRAVENOUS
  Administered 2015-01-07: .75 g via INTRAVENOUS
  Filled 2015-01-06: qty 1.5

## 2015-01-06 MED ORDER — EPINEPHRINE HCL 1 MG/ML IJ SOLN
0.0000 ug/min | INTRAVENOUS | Status: DC
  Filled 2015-01-06: qty 4

## 2015-01-06 MED ORDER — MAGNESIUM SULFATE 50 % IJ SOLN
40.0000 meq | INTRAMUSCULAR | Status: DC
  Filled 2015-01-06: qty 10

## 2015-01-06 MED ORDER — PLASMA-LYTE 148 IV SOLN
INTRAVENOUS | Status: AC
  Administered 2015-01-07: 500 mL
  Filled 2015-01-06: qty 2.5

## 2015-01-06 MED ORDER — VANCOMYCIN HCL 10 G IV SOLR
1250.0000 mg | INTRAVENOUS | Status: AC
  Administered 2015-01-07: 1250 mg via INTRAVENOUS
  Filled 2015-01-06: qty 1250

## 2015-01-06 NOTE — Care Management Note (Signed)
Case Management Note  Patient Details  Name: [name] MRN: [id number] Date of Birth: 1950-11-09  Subjective/Objective:       Adm w angina             Action/Plan: cares for elderly mother   Expected Discharge Date:  01/10/15               Expected Discharge Plan:  Home/Self Care  In-House Referral:     Discharge planning Services     Post Acute Care Choice:    Choice offered to:     DME Arranged:    DME Agency:     HH Arranged:    HH Agency:     Status of Service:     Medicare Important Message Given:    Date Medicare IM Given:    Medicare IM give by:    Date Additional Medicare IM Given:    Additional Medicare Important Message give by:     If discussed at Long Length of Stay Meetings, dates discussed:    Additional Comments:ur review done  [name], [name], RN 01/06/2015, 1:35 PM

## 2015-01-06 NOTE — Progress Notes (Signed)
Patient ID: [name], female   DOB: 08/01/1951, 64 y.o.   MRN: [id number]    Subjective:  Denies SSCP, palpitations or Dyspnea Groin a bit sore  Objective:  Filed Vitals:   01/06/15 0319 01/06/15 0400 01/06/15 0800 01/06/15 0822  BP: 142/65 130/106 138/58 143/52  Pulse:      Temp: 97.9 F (36.6 C)   97.8 F (36.6 C)  TempSrc: Oral   Oral  Resp: 23 23 13 12   Height:      Weight: 81.14 kg (178 lb 14.1 oz)     SpO2: 98% 99% 99% 97%    Intake/Output from previous day:  Intake/Output Summary (Last 24 hours) at 01/06/15 [id number] Last data filed at 01/06/15 0800  Gross per 24 hour  Intake    556 ml  Output    800 ml  Net   -244 ml    Physical Exam: Affect appropriate Healthy:  appears stated age HEENT: normal Neck supple with no adenopathy JVP normal no bruits no thyromegaly Lungs clear with no wheezing and good diaphragmatic motion Heart:  S1/S2 no murmur, no rub, gallop or click PMI normal Abdomen: benighn, BS positve, no tenderness, no AAA no bruit.  No HSM or HJR Distal pulses intact with no bruits No edema Neuro non-focal Skin warm and dry No muscular weakness Right groin tender no bruit  Mild echymosis   Lab Results: Basic Metabolic Panel:  Recent Labs  11/91/4705/29/16 1640 01/05/15 2335 01/06/15 0523  NA 135  --  136  K 4.8  --  3.7  CL 98*  --  101  CO2 28  --  30  GLUCOSE 183*  --  224*  BUN 14  --  11  CREATININE 0.79  --  0.72  CALCIUM 9.6  --  8.9  MG  --  1.5*  --    Liver Function Tests:  Recent Labs  01/05/15 1640  AST 35  ALT 26  ALKPHOS 122  BILITOT 1.0  PROT 7.4  ALBUMIN 3.8   No results for input(s): LIPASE, AMYLASE in the last 72 hours. CBC:  Recent Labs  01/05/15 1640 01/06/15 0523  WBC 8.6 6.0  NEUTROABS 5.9  --   HGB 12.5 11.0*  HCT 36.1 31.4*  MCV 83.4 83.1  PLT 304 255   Cardiac Enzymes:  Recent Labs  01/05/15 1640 01/05/15 2335 01/06/15 0523  TROPONINI <0.03 <0.03 <0.03   Fasting Lipid  Panel:  Recent Labs  01/06/15 0523  CHOL 161  HDL 41  LDLCALC 89  TRIG 154*  CHOLHDL 3.9   Thyroid Function Tests:  Recent Labs  01/05/15 2335  TSH 5.380*    Imaging: Dg Chest 2 View  01/05/2015   CLINICAL DATA:  Left side chest pain jaw pain arm pain  EXAM: CHEST  2 VIEW  COMPARISON:  12/27/2014  FINDINGS: The heart size and mediastinal contours are within normal limits. Both lungs are clear. The visualized skeletal structures are unremarkable.  IMPRESSION: No active cardiopulmonary disease.   Electronically Signed   By: Esperanza Heiraymond  Rubner M.D.   On: 01/05/2015 18:07    Cardiac Studies:  ECG:  SR rate 60 PVC   Telemetry:  NSR no arrhythmia  Echo:   Medications:   . aspirin EC  81 mg Oral Daily  . atenolol  100 mg Oral Daily  . guaiFENesin  600 mg Oral Daily  . insulin aspart  0-9 Units Subcutaneous TID WC  . linagliptin  5 mg Oral  Daily  . rosuvastatin  20 mg Oral Daily     . sodium chloride Stopped (01/06/15 0800)  . sodium chloride 10 mL/hr at 01/06/15 0800  . heparin 1,000 Units/hr (01/06/15 0800)  . nitroGLYCERIN 15 mcg/min (01/06/15 0800)    Assessment/Plan:  CAD:  For CABG latter this week with PVT.  Check P2Y Plavix held  Right groin at cath sight prelim [place] hematoma no pseudoaneurysm  Pre CABG dopplers written for   DM:  On linagliptin  Low carb diet  Chol:  On statin    Charlton Haws 01/06/2015, 9:06 AM

## 2015-01-06 NOTE — Progress Notes (Signed)
Pre-op Cardiac Surgery  Carotid Findings:  There is 60-79% right ICA stenosis, highest end of scale secondary to significant calcific plaque formation at the origin.  There is 40-59% left ICA stenosis, lowest end of scale.  Vertebral artery flow is antegrade.   Upper Extremity Right Left  Brachial Pressures 129T 113T  Radial Waveforms T T  Ulnar Waveforms T T  Palmar Arch (Allen's Test) WNL WNL   Findings:  WNL    Lower  Extremity Right Left  Dorsalis Pedis 106M 109M  Anterior Tibial    Posterior Tibial 127M 128M  Ankle/Brachial Indices 0.98 0.99    Findings:  ABIs suggest adequate arterial blood flow to the lower extremities but have abnormal waveforms.

## 2015-01-06 NOTE — Progress Notes (Addendum)
ANTICOAGULATION CONSULT NOTE - Initial Consult  Pharmacy Consult for heparin Indication: ACS/STEMI  Allergies  Allergen Reactions  . Oxycodone Hcl Anaphylaxis and Other (See Comments)    Palpitations, difficulty breathing.  . Metformin And Related Diarrhea  . Atorvastatin Cough  . Codeine Nausea Only and Other (See Comments)    Constipates; doesn't like the way it makes her feel     Patient Measurements: Height: 5\' 8"  (172.7 cm) Weight: 178 lb 14.1 oz (81.14 kg) IBW/kg (Calculated) : 63.9 Heparin Dosing Weight: 80.7 kg TBW = 82.6 kg  Vital Signs: Temp: 97.8 F (36.6 C) (05/30 0822) Temp Source: Oral (05/30 0822) BP: 143/52 mmHg (05/30 0822)  Labs:  Recent Labs  01/05/15 1640 01/05/15 2335 01/06/15 0523 01/06/15 0935  HGB 12.5  --  11.0*  --   HCT 36.1  --  31.4*  --   PLT 304  --  255  --   HEPARINUNFRC  --   --   --  0.10*  CREATININE 0.79  --  0.72  --   TROPONINI <0.03 <0.03 <0.03  --     Estimated Creatinine Clearance: 80.4 mL/min (by C-G formula based on Cr of 0.72).   Medical History: Past Medical History  Diagnosis Date  . Type 2 diabetes mellitus   . Hyperlipidemia   . Hypertension   . Coronary artery disease     Status post coronary stenting approximately 10 years ago  . Peripheral artery disease    Assessment: 64 yo F presenting with CP and R thigh ecchymosis at cath groin site.  S/p cardiac cath 01/03/15.  Pharmacy has been consulted to dose heparin in the setting of r/o ACS.    MD had request pharmacy to dose "low dose" heparin for ACS/STEMI with no bolus and keep at low end of therapeutic range. Of note, to stop plavix for CABG consult.   Wt 82.6 kg, HDW 80.7 kg; Scr stable; Hgb 12.5 > 11, plt wnl.  No reports of bleeding at this time.    Heparin level today is SUBtherapeutic at 0.10.  Will require an increase in heparin rate but will remain cautious to maintain a lower goal.    Goal of Therapy:  Heparin level 0.3 - 0.5 units/ml Monitor  platelets by anticoagulation protocol: Yes   Plan:  - Increase heparin gtt rate to 1200 units/hr, no bolus - Heparin level 6 hours after change of infusion rate - Daily heparin level and CBC - Monitor for signs and symptoms of bleeding  Red ChristiansSamson Lee, Pharm. D. Clinical Pharmacy Resident Pager: [phone] Ph: [phone] 01/06/2015 11:39 AM    Addendum  Heparin level remains undetectable. No problems with infusion per RN. Increase rate to 1400 units/hr, next level with AM labs. CABG tomorrow.   Baldemar FridayMasters, Saw Mendenhall M  01/06/2015 7:50 PM
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
## 2015-01-06 NOTE — Progress Notes (Signed)
  Echocardiogram 2D Echocardiogram has been performed.  Delcie RochENNINGTON, Angeline Trick 01/06/2015, 2:27 PM

## 2015-01-07 ENCOUNTER — Inpatient Hospital Stay (HOSPITAL_COMMUNITY): Payer: Medicare Other | Admitting: Anesthesiology

## 2015-01-07 ENCOUNTER — Encounter (HOSPITAL_COMMUNITY)
Admission: EM | Disposition: A | Discharge status: Home / Self Care | Payer: Medicare Other | Source: Home / Self Care | Attending: Cardiothoracic Surgery

## 2015-01-07 ENCOUNTER — Encounter (HOSPITAL_COMMUNITY): Payer: Self-pay | Admitting: Certified Registered Nurse Anesthetist

## 2015-01-07 ENCOUNTER — Inpatient Hospital Stay (HOSPITAL_COMMUNITY): Payer: Medicare Other

## 2015-01-07 ENCOUNTER — Encounter: Payer: Medicare Other | Admitting: Cardiothoracic Surgery

## 2015-01-07 DIAGNOSIS — Z951 Presence of aortocoronary bypass graft: Secondary | ICD-10-CM

## 2015-01-07 HISTORY — PX: CORONARY ARTERY BYPASS GRAFT: SHX141

## 2015-01-07 LAB — POCT I-STAT 3, ART BLOOD GAS (G3+)
ACID-BASE DEFICIT: 4 mmol/L — AB (ref 0.0–2.0)
Acid-base deficit: 2 mmol/L (ref 0.0–2.0)
Acid-base deficit: 3 mmol/L — ABNORMAL HIGH (ref 0.0–2.0)
Acid-base deficit: 3 mmol/L — ABNORMAL HIGH (ref 0.0–2.0)
Bicarbonate: 24.7 mEq/L — ABNORMAL HIGH (ref 20.0–24.0)
Bicarbonate: 22.7 mEq/L (ref 20.0–24.0)
Bicarbonate: 21.5 mEq/L (ref 20.0–24.0)
Bicarbonate: 23.3 mEq/L (ref 20.0–24.0)
Bicarbonate: 22.1 mEq/L (ref 20.0–24.0)
O2 SAT: 100 %
O2 Saturation: 100 %
O2 Saturation: 100 %
O2 Saturation: 95 %
O2 Saturation: 99 %
PCO2 ART: 41.8 mmHg (ref 35.0–45.0)
PCO2 ART: 35.1 mmHg (ref 35.0–45.0)
PCO2 ART: 33.7 mmHg — AB (ref 35.0–45.0)
PCO2 ART: 47.7 mmHg — AB (ref 35.0–45.0)
PH ART: 7.381 (ref 7.350–7.450)
PH ART: 7.41 (ref 7.350–7.450)
PH ART: 7.288 — AB (ref 7.350–7.450)
PO2 ART: 196 mmHg — AB (ref 80.0–100.0)
Patient temperature: 36.4
Patient temperature: 36.6
Pressure control: 1 cmH2O
TCO2: 26 mmol/L (ref 0–100)
TCO2: 24 mmol/L (ref 0–100)
TCO2: 23 mmol/L (ref 0–100)
TCO2: 25 mmol/L (ref 0–100)
TCO2: 24 mmol/L (ref 0–100)
pCO2 arterial: 46.1 mmHg — ABNORMAL HIGH (ref 35.0–45.0)
pH, Arterial: 7.418 (ref 7.350–7.450)
pH, Arterial: 7.297 — ABNORMAL LOW (ref 7.350–7.450)
pO2, Arterial: 437 mmHg — ABNORMAL HIGH (ref 80.0–100.0)
pO2, Arterial: 314 mmHg — ABNORMAL HIGH (ref 80.0–100.0)
pO2, Arterial: 72 mmHg — ABNORMAL LOW (ref 80.0–100.0)
pO2, Arterial: 128 mmHg — ABNORMAL HIGH (ref 80.0–100.0)

## 2015-01-07 LAB — BASIC METABOLIC PANEL
Anion gap: 8 (ref 5–15)
BUN: 14 mg/dL (ref 6–20)
CO2: 25 mmol/L (ref 22–32)
Calcium: 8.7 mg/dL — ABNORMAL LOW (ref 8.9–10.3)
Chloride: 100 mmol/L — ABNORMAL LOW (ref 101–111)
Creatinine, Ser: 0.72 mg/dL (ref 0.44–1.00)
GFR calc Af Amer: >60 mL/min (ref >60)
GFR calc non Af Amer: >60 mL/min (ref >60)
Glucose, Bld: 275 mg/dL — ABNORMAL HIGH (ref 65–99)
Potassium: 3.7 mmol/L (ref 3.5–5.1)
Sodium: 133 mmol/L — ABNORMAL LOW (ref 135–145)

## 2015-01-07 LAB — MAGNESIUM: Magnesium: 2.6 mg/dL — ABNORMAL HIGH (ref 1.7–2.4)

## 2015-01-07 LAB — CREATININE, SERUM
Creatinine, Ser: 0.6 mg/dL (ref 0.44–1.00)
GFR calc Af Amer: >60 mL/min (ref >60)
GFR calc non Af Amer: >60 mL/min (ref >60)

## 2015-01-07 LAB — POCT I-STAT, CHEM 8
BUN: 15 mg/dL (ref 6–20)
BUN: 12 mg/dL (ref 6–20)
BUN: 14 mg/dL (ref 6–20)
BUN: 13 mg/dL (ref 6–20)
BUN: 13 mg/dL (ref 6–20)
BUN: 13 mg/dL (ref 6–20)
BUN: 11 mg/dL (ref 6–20)
CALCIUM ION: 1.1 mmol/L — AB (ref 1.13–1.30)
CHLORIDE: 102 mmol/L (ref 101–111)
CHLORIDE: 99 mmol/L — AB (ref 101–111)
CHLORIDE: 106 mmol/L (ref 101–111)
CREATININE: 0.4 mg/dL — AB (ref 0.44–1.00)
CREATININE: 0.4 mg/dL — AB (ref 0.44–1.00)
Calcium, Ion: 1.23 mmol/L (ref 1.13–1.30)
Calcium, Ion: 0.92 mmol/L — ABNORMAL LOW (ref 1.13–1.30)
Calcium, Ion: 1.23 mmol/L (ref 1.13–1.30)
Calcium, Ion: 1.06 mmol/L — ABNORMAL LOW (ref 1.13–1.30)
Calcium, Ion: 1.08 mmol/L — ABNORMAL LOW (ref 1.13–1.30)
Calcium, Ion: 1.22 mmol/L (ref 1.13–1.30)
Chloride: 102 mmol/L (ref 101–111)
Chloride: 102 mmol/L (ref 101–111)
Chloride: 100 mmol/L — ABNORMAL LOW (ref 101–111)
Chloride: 102 mmol/L (ref 101–111)
Creatinine, Ser: 0.3 mg/dL — ABNORMAL LOW (ref 0.44–1.00)
Creatinine, Ser: 0.4 mg/dL — ABNORMAL LOW (ref 0.44–1.00)
Creatinine, Ser: 0.4 mg/dL — ABNORMAL LOW (ref 0.44–1.00)
Creatinine, Ser: 0.5 mg/dL (ref 0.44–1.00)
Creatinine, Ser: 0.5 mg/dL (ref 0.44–1.00)
GLUCOSE: 160 mg/dL — AB (ref 65–99)
GLUCOSE: 160 mg/dL — AB (ref 65–99)
GLUCOSE: 220 mg/dL — AB (ref 65–99)
Glucose, Bld: 231 mg/dL — ABNORMAL HIGH (ref 65–99)
Glucose, Bld: 167 mg/dL — ABNORMAL HIGH (ref 65–99)
Glucose, Bld: 137 mg/dL — ABNORMAL HIGH (ref 65–99)
Glucose, Bld: 152 mg/dL — ABNORMAL HIGH (ref 65–99)
HCT: 29 % — ABNORMAL LOW (ref 36.0–46.0)
HCT: 24 % — ABNORMAL LOW (ref 36.0–46.0)
HCT: 25 % — ABNORMAL LOW (ref 36.0–46.0)
HCT: 25 % — ABNORMAL LOW (ref 36.0–46.0)
HCT: 23 % — ABNORMAL LOW (ref 36.0–46.0)
HCT: 25 % — ABNORMAL LOW (ref 36.0–46.0)
HEMATOCRIT: 27 % — AB (ref 36.0–46.0)
Hemoglobin: 9.9 g/dL — ABNORMAL LOW (ref 12.0–15.0)
Hemoglobin: 8.2 g/dL — ABNORMAL LOW (ref 12.0–15.0)
Hemoglobin: 8.5 g/dL — ABNORMAL LOW (ref 12.0–15.0)
Hemoglobin: 8.5 g/dL — ABNORMAL LOW (ref 12.0–15.0)
Hemoglobin: 7.8 g/dL — ABNORMAL LOW (ref 12.0–15.0)
Hemoglobin: 8.5 g/dL — ABNORMAL LOW (ref 12.0–15.0)
Hemoglobin: 9.2 g/dL — ABNORMAL LOW (ref 12.0–15.0)
POTASSIUM: 3.7 mmol/L (ref 3.5–5.1)
Potassium: 3.9 mmol/L (ref 3.5–5.1)
Potassium: 4.4 mmol/L (ref 3.5–5.1)
Potassium: 4.1 mmol/L (ref 3.5–5.1)
Potassium: 4.1 mmol/L (ref 3.5–5.1)
Potassium: 4.7 mmol/L (ref 3.5–5.1)
Potassium: 4.9 mmol/L (ref 3.5–5.1)
Sodium: 136 mmol/L (ref 135–145)
Sodium: 136 mmol/L (ref 135–145)
Sodium: 137 mmol/L (ref 135–145)
Sodium: 133 mmol/L — ABNORMAL LOW (ref 135–145)
Sodium: 135 mmol/L (ref 135–145)
Sodium: 137 mmol/L (ref 135–145)
Sodium: 135 mmol/L (ref 135–145)
TCO2: 23 mmol/L (ref 0–100)
TCO2: 22 mmol/L (ref 0–100)
TCO2: 22 mmol/L (ref 0–100)
TCO2: 21 mmol/L (ref 0–100)
TCO2: 21 mmol/L (ref 0–100)
TCO2: 22 mmol/L (ref 0–100)
TCO2: 20 mmol/L (ref 0–100)

## 2015-01-07 LAB — CBC
HCT: 26.5 % — ABNORMAL LOW (ref 36.0–46.0)
HCT: 27 % — ABNORMAL LOW (ref 36.0–46.0)
HEMATOCRIT: 32.2 % — AB (ref 36.0–46.0)
HEMOGLOBIN: 10.8 g/dL — AB (ref 12.0–15.0)
Hemoglobin: 9.2 g/dL — ABNORMAL LOW (ref 12.0–15.0)
Hemoglobin: 9.4 g/dL — ABNORMAL LOW (ref 12.0–15.0)
MCH: 28.1 pg (ref 26.0–34.0)
MCH: 29.2 pg (ref 26.0–34.0)
MCH: 29.3 pg (ref 26.0–34.0)
MCHC: 33.5 g/dL (ref 30.0–36.0)
MCHC: 34.7 g/dL (ref 30.0–36.0)
MCHC: 34.8 g/dL (ref 30.0–36.0)
MCV: 83.6 fL (ref 78.0–100.0)
MCV: 84.1 fL (ref 78.0–100.0)
MCV: 84.1 fL (ref 78.0–100.0)
PLATELETS: 178 10*3/uL (ref 150–400)
Platelets: 250 10*3/uL (ref 150–400)
Platelets: 193 10*3/uL (ref 150–400)
RBC: 3.85 MIL/uL — AB (ref 3.87–5.11)
RBC: 3.15 MIL/uL — AB (ref 3.87–5.11)
RBC: 3.21 MIL/uL — ABNORMAL LOW (ref 3.87–5.11)
RDW: 13.3 % (ref 11.5–15.5)
RDW: 14 % (ref 11.5–15.5)
RDW: 14 % (ref 11.5–15.5)
WBC: 6.5 10*3/uL (ref 4.0–10.5)
WBC: 9.6 10*3/uL (ref 4.0–10.5)
WBC: 12.3 10*3/uL — ABNORMAL HIGH (ref 4.0–10.5)

## 2015-01-07 LAB — PROTIME-INR
INR: 1.39 (ref 0.00–1.49)
Prothrombin Time: 17.2 seconds — ABNORMAL HIGH (ref 11.6–15.2)

## 2015-01-07 LAB — POCT I-STAT 4, (NA,K, GLUC, HGB,HCT)
GLUCOSE: 116 mg/dL — AB (ref 65–99)
HCT: 26 % — ABNORMAL LOW (ref 36.0–46.0)
Hemoglobin: 8.8 g/dL — ABNORMAL LOW (ref 12.0–15.0)
POTASSIUM: 3.6 mmol/L (ref 3.5–5.1)
SODIUM: 138 mmol/L (ref 135–145)

## 2015-01-07 LAB — HEMOGLOBIN A1C
Hgb A1c MFr Bld: 9 % — ABNORMAL HIGH (ref 4.8–5.6)
Hgb A1c MFr Bld: 9 % — ABNORMAL HIGH (ref 4.8–5.6)
Mean Plasma Glucose: 212 mg/dL
Mean Plasma Glucose: 212 mg/dL

## 2015-01-07 LAB — HEMOGLOBIN AND HEMATOCRIT, BLOOD
HCT: 25.7 % — ABNORMAL LOW (ref 36.0–46.0)
Hemoglobin: 9 g/dL — ABNORMAL LOW (ref 12.0–15.0)

## 2015-01-07 LAB — PLATELET INHIBITION P2Y12: PLATELET FUNCTION P2Y12: 306 [PRU] (ref 194–418)

## 2015-01-07 LAB — PLATELET COUNT: Platelets: 176 10*3/uL (ref 150–400)

## 2015-01-07 LAB — HEPARIN LEVEL (UNFRACTIONATED): Heparin Unfractionated: 0.28 IU/mL — ABNORMAL LOW (ref 0.30–0.70)

## 2015-01-07 LAB — GLUCOSE, CAPILLARY: Glucose-Capillary: 246 mg/dL — ABNORMAL HIGH (ref 65–99)

## 2015-01-07 LAB — PREPARE RBC (CROSSMATCH)

## 2015-01-07 LAB — APTT: APTT: 36 s (ref 24–37)

## 2015-01-07 SURGERY — CORONARY ARTERY BYPASS GRAFTING (CABG)
Anesthesia: General | Site: Chest

## 2015-01-07 MED ORDER — METOPROLOL TARTRATE 25 MG/10 ML ORAL SUSPENSION
12.5000 mg | Freq: Two times a day (BID) | ORAL | Status: DC
  Filled 2015-01-07 (×9): qty 5

## 2015-01-07 MED ORDER — POTASSIUM CHLORIDE 10 MEQ/50ML IV SOLN
10.0000 meq | Freq: Once | INTRAVENOUS | Status: AC
  Administered 2015-01-07: 10 meq via INTRAVENOUS

## 2015-01-07 MED ORDER — FENTANYL CITRATE (PF) 250 MCG/5ML IJ SOLN
INTRAMUSCULAR | Status: AC
  Filled 2015-01-07: qty 5

## 2015-01-07 MED ORDER — LACTATED RINGERS IV SOLN: INTRAVENOUS | Status: DC

## 2015-01-07 MED ORDER — PLASMA-LYTE 148 IV SOLN
INTRAVENOUS | Status: DC
  Filled 2015-01-07: qty 2.5

## 2015-01-07 MED ORDER — CETYLPYRIDINIUM CHLORIDE 0.05 % MT LIQD
7.0000 mL | Freq: Four times a day (QID) | OROMUCOSAL | Status: DC
  Administered 2015-01-08 – 2015-01-09 (×8): 7 mL via OROMUCOSAL

## 2015-01-07 MED ORDER — FENTANYL CITRATE (PF) 100 MCG/2ML IJ SOLN
INTRAMUSCULAR | Status: DC | PRN
  Administered 2015-01-07: 50 ug via INTRAVENOUS
  Administered 2015-01-07: 250 ug via INTRAVENOUS
  Administered 2015-01-07: 100 ug via INTRAVENOUS
  Administered 2015-01-07: 150 ug via INTRAVENOUS
  Administered 2015-01-07: 250 ug via INTRAVENOUS
  Administered 2015-01-07 (×3): 100 ug via INTRAVENOUS
  Administered 2015-01-07: 250 ug via INTRAVENOUS
  Administered 2015-01-07: 150 ug via INTRAVENOUS
  Administered 2015-01-07: 50 ug via INTRAVENOUS
  Administered 2015-01-07: 450 ug via INTRAVENOUS

## 2015-01-07 MED ORDER — MAGNESIUM SULFATE 4 GM/100ML IV SOLN
4.0000 g | Freq: Once | INTRAVENOUS | Status: AC
  Administered 2015-01-07: 4 g via INTRAVENOUS
  Filled 2015-01-07: qty 100

## 2015-01-07 MED ORDER — MIDAZOLAM HCL 10 MG/2ML IJ SOLN
INTRAMUSCULAR | Status: AC
  Filled 2015-01-07: qty 2

## 2015-01-07 MED ORDER — DOCUSATE SODIUM 100 MG PO CAPS
200.0000 mg | ORAL_CAPSULE | Freq: Every day | ORAL | Status: DC
  Administered 2015-01-08 – 2015-01-11 (×3): 200 mg via ORAL
  Filled 2015-01-07 (×4): qty 2

## 2015-01-07 MED ORDER — PANTOPRAZOLE SODIUM 40 MG PO TBEC: 40.0000 mg | DELAYED_RELEASE_TABLET | Freq: Every day | ORAL | Status: DC

## 2015-01-07 MED ORDER — HEMOSTATIC AGENTS (NO CHARGE) OPTIME
TOPICAL | Status: DC | PRN
  Administered 2015-01-07 (×2): 1 via TOPICAL

## 2015-01-07 MED ORDER — PROTAMINE SULFATE 10 MG/ML IV SOLN
INTRAVENOUS | Status: AC
  Filled 2015-01-07: qty 50

## 2015-01-07 MED ORDER — ROCURONIUM BROMIDE 100 MG/10ML IV SOLN
INTRAVENOUS | Status: DC | PRN
  Administered 2015-01-07 (×2): 50 mg via INTRAVENOUS

## 2015-01-07 MED ORDER — DEXTROSE 5 % IV SOLN
1.5000 g | Freq: Two times a day (BID) | INTRAVENOUS | Status: AC
  Administered 2015-01-07 – 2015-01-09 (×4): 1.5 g via INTRAVENOUS
  Filled 2015-01-07 (×4): qty 1.5

## 2015-01-07 MED ORDER — NITROGLYCERIN IN D5W 200-5 MCG/ML-% IV SOLN
0.0000 ug/min | INTRAVENOUS | Status: DC
  Filled 2015-01-07: qty 250

## 2015-01-07 MED ORDER — CHLORHEXIDINE GLUCONATE 0.12 % MT SOLN
15.0000 mL | Freq: Two times a day (BID) | OROMUCOSAL | Status: DC
  Administered 2015-01-07 – 2015-01-10 (×7): 15 mL via OROMUCOSAL
  Filled 2015-01-07 (×10): qty 15

## 2015-01-07 MED ORDER — DEXMEDETOMIDINE HCL IN NACL 200 MCG/50ML IV SOLN: 0.0000 ug/kg/h | INTRAVENOUS | Status: DC

## 2015-01-07 MED ORDER — BISACODYL 10 MG RE SUPP
10.0000 mg | Freq: Every day | RECTAL | Status: DC
  Administered 2015-01-11: 10 mg via RECTAL
  Filled 2015-01-07: qty 1

## 2015-01-07 MED ORDER — PROPOFOL 10 MG/ML IV BOLUS
INTRAVENOUS | Status: DC | PRN
  Administered 2015-01-07 (×2): 30 mg via INTRAVENOUS

## 2015-01-07 MED ORDER — VECURONIUM BROMIDE 10 MG IV SOLR
INTRAVENOUS | Status: DC | PRN
  Administered 2015-01-07 (×2): 5 mg via INTRAVENOUS

## 2015-01-07 MED ORDER — ACETAMINOPHEN 160 MG/5ML PO SOLN
1000.0000 mg | Freq: Four times a day (QID) | ORAL | Status: DC
  Filled 2015-01-07: qty 40

## 2015-01-07 MED ORDER — LACTATED RINGERS IV SOLN: 500.0000 mL | Freq: Once | INTRAVENOUS | Status: DC | PRN

## 2015-01-07 MED ORDER — BISACODYL 5 MG PO TBEC
10.0000 mg | DELAYED_RELEASE_TABLET | Freq: Every day | ORAL | Status: DC
  Administered 2015-01-08 – 2015-01-09 (×2): 10 mg via ORAL
  Filled 2015-01-07 (×2): qty 2

## 2015-01-07 MED ORDER — ALBUMIN HUMAN 5 % IV SOLN
250.0000 mL | INTRAVENOUS | Status: AC | PRN
Start: 2015-01-07 — End: 2015-01-08
  Administered 2015-01-07 (×2): 250 mL via INTRAVENOUS
  Filled 2015-01-07: qty 250

## 2015-01-07 MED ORDER — ASPIRIN EC 325 MG PO TBEC
325.0000 mg | DELAYED_RELEASE_TABLET | Freq: Every day | ORAL | Status: DC
  Administered 2015-01-08: 325 mg via ORAL
  Filled 2015-01-07 (×2): qty 1

## 2015-01-07 MED ORDER — FAMOTIDINE IN NACL 20-0.9 MG/50ML-% IV SOLN
20.0000 mg | Freq: Two times a day (BID) | INTRAVENOUS | Status: DC
  Administered 2015-01-07: 20 mg via INTRAVENOUS

## 2015-01-07 MED ORDER — SODIUM CHLORIDE 0.9 % IV SOLN
250.0000 mL | INTRAVENOUS | Status: DC
  Administered 2015-01-07: 14:00:00 via INTRAVENOUS

## 2015-01-07 MED ORDER — LIDOCAINE HCL (CARDIAC) 20 MG/ML IV SOLN
INTRAVENOUS | Status: AC
  Filled 2015-01-07: qty 5

## 2015-01-07 MED ORDER — SODIUM CHLORIDE 0.9 % IV SOLN
INTRAVENOUS | Status: DC
  Administered 2015-01-07: 11.6 [IU]/h via INTRAVENOUS
  Filled 2015-01-07: qty 2.5

## 2015-01-07 MED ORDER — SODIUM CHLORIDE 0.9 % IJ SOLN
3.0000 mL | Freq: Two times a day (BID) | INTRAMUSCULAR | Status: DC
  Administered 2015-01-08 – 2015-01-09 (×3): 3 mL via INTRAVENOUS

## 2015-01-07 MED ORDER — PROTAMINE SULFATE 10 MG/ML IV SOLN
INTRAVENOUS | Status: DC | PRN
  Administered 2015-01-07: 230 mg via INTRAVENOUS
  Administered 2015-01-07: 10 mg via INTRAVENOUS

## 2015-01-07 MED ORDER — LACTATED RINGERS IV SOLN
INTRAVENOUS | Status: DC | PRN
  Administered 2015-01-07: 07:00:00 via INTRAVENOUS

## 2015-01-07 MED ORDER — PHENYLEPHRINE HCL 10 MG/ML IJ SOLN
INTRAMUSCULAR | Status: DC | PRN
  Administered 2015-01-07: 80 ug via INTRAVENOUS

## 2015-01-07 MED ORDER — ASPIRIN 81 MG PO CHEW: 324.0000 mg | CHEWABLE_TABLET | Freq: Every day | ORAL | Status: DC

## 2015-01-07 MED ORDER — HEPARIN SODIUM (PORCINE) 1000 UNIT/ML IJ SOLN
INTRAMUSCULAR | Status: DC | PRN
  Administered 2015-01-07: 4000 [IU] via INTRAVENOUS
  Administered 2015-01-07: 20000 [IU] via INTRAVENOUS

## 2015-01-07 MED ORDER — PROPOFOL 10 MG/ML IV BOLUS
INTRAVENOUS | Status: AC
  Filled 2015-01-07: qty 20

## 2015-01-07 MED ORDER — LACTATED RINGERS IV SOLN
INTRAVENOUS | Status: DC | PRN
  Administered 2015-01-07 (×2): via INTRAVENOUS

## 2015-01-07 MED ORDER — MORPHINE SULFATE 2 MG/ML IJ SOLN
1.0000 mg | INTRAMUSCULAR | Status: AC | PRN
  Administered 2015-01-07: 2 mg via INTRAVENOUS
  Filled 2015-01-07: qty 2
  Filled 2015-01-07: qty 1

## 2015-01-07 MED ORDER — GELATIN ABSORBABLE MT POWD
OROMUCOSAL | Status: DC | PRN
  Administered 2015-01-07: 4 mL via TOPICAL

## 2015-01-07 MED ORDER — VECURONIUM BROMIDE 10 MG IV SOLR
INTRAVENOUS | Status: AC
  Filled 2015-01-07: qty 10

## 2015-01-07 MED ORDER — ACETAMINOPHEN 160 MG/5ML PO SOLN: 650.0000 mg | Freq: Once | ORAL | Status: AC

## 2015-01-07 MED ORDER — SODIUM CHLORIDE 0.45 % IV SOLN: INTRAVENOUS | Status: DC | PRN

## 2015-01-07 MED ORDER — METOCLOPRAMIDE HCL 5 MG/ML IJ SOLN
10.0000 mg | Freq: Four times a day (QID) | INTRAMUSCULAR | Status: AC
  Administered 2015-01-07 – 2015-01-08 (×4): 10 mg via INTRAVENOUS
  Filled 2015-01-07 (×4): qty 2

## 2015-01-07 MED ORDER — METOPROLOL TARTRATE 1 MG/ML IV SOLN: 2.5000 mg | INTRAVENOUS | Status: DC | PRN

## 2015-01-07 MED ORDER — ROCURONIUM BROMIDE 50 MG/5ML IV SOLN
INTRAVENOUS | Status: AC
  Filled 2015-01-07: qty 2

## 2015-01-07 MED ORDER — METOPROLOL TARTRATE 12.5 MG HALF TABLET
12.5000 mg | ORAL_TABLET | Freq: Two times a day (BID) | ORAL | Status: DC
  Administered 2015-01-07 – 2015-01-11 (×8): 12.5 mg via ORAL
  Filled 2015-01-07 (×9): qty 1

## 2015-01-07 MED ORDER — PHENYLEPHRINE HCL 10 MG/ML IJ SOLN: 0.0000 ug/min | INTRAVENOUS | Status: DC

## 2015-01-07 MED ORDER — SODIUM CHLORIDE 0.9 % IV SOLN: INTRAVENOUS | Status: DC

## 2015-01-07 MED ORDER — CALCIUM CHLORIDE 10 % IV SOLN
INTRAVENOUS | Status: DC | PRN
  Administered 2015-01-07 (×5): 200 mg via INTRAVENOUS

## 2015-01-07 MED ORDER — VANCOMYCIN HCL IN DEXTROSE 1-5 GM/200ML-% IV SOLN
1000.0000 mg | Freq: Once | INTRAVENOUS | Status: DC
  Filled 2015-01-07: qty 200

## 2015-01-07 MED ORDER — 0.9 % SODIUM CHLORIDE (POUR BTL) OPTIME
TOPICAL | Status: DC | PRN
  Administered 2015-01-07: 6000 mL

## 2015-01-07 MED ORDER — STERILE WATER FOR INJECTION IJ SOLN
INTRAMUSCULAR | Status: AC
  Filled 2015-01-07: qty 10

## 2015-01-07 MED ORDER — ACETAMINOPHEN 650 MG RE SUPP
650.0000 mg | Freq: Once | RECTAL | Status: AC
  Administered 2015-01-07: 650 mg via RECTAL

## 2015-01-07 MED ORDER — MIDAZOLAM HCL 5 MG/5ML IJ SOLN
INTRAMUSCULAR | Status: DC | PRN
  Administered 2015-01-07: 2 mg via INTRAVENOUS
  Administered 2015-01-07: 4 mg via INTRAVENOUS
  Administered 2015-01-07: 2 mg via INTRAVENOUS
  Administered 2015-01-07 (×2): 1 mg via INTRAVENOUS

## 2015-01-07 MED ORDER — OXYCODONE HCL 5 MG PO TABS
5.0000 mg | ORAL_TABLET | ORAL | Status: DC | PRN
  Administered 2015-01-08 – 2015-01-09 (×8): 10 mg via ORAL
  Filled 2015-01-07 (×8): qty 2

## 2015-01-07 MED ORDER — VANCOMYCIN HCL IN DEXTROSE 1-5 GM/200ML-% IV SOLN
1000.0000 mg | Freq: Two times a day (BID) | INTRAVENOUS | Status: AC
  Administered 2015-01-07 – 2015-01-08 (×3): 1000 mg via INTRAVENOUS
  Filled 2015-01-07 (×3): qty 200

## 2015-01-07 MED ORDER — MORPHINE SULFATE 2 MG/ML IJ SOLN
2.0000 mg | INTRAMUSCULAR | Status: DC | PRN
  Administered 2015-01-07 – 2015-01-08 (×5): 4 mg via INTRAVENOUS
  Administered 2015-01-08 – 2015-01-10 (×6): 2 mg via INTRAVENOUS
  Filled 2015-01-07 (×2): qty 1
  Filled 2015-01-07: qty 2
  Filled 2015-01-07: qty 1
  Filled 2015-01-07 (×3): qty 2
  Filled 2015-01-07 (×3): qty 1

## 2015-01-07 MED ORDER — ACETAMINOPHEN 500 MG PO TABS
1000.0000 mg | ORAL_TABLET | Freq: Four times a day (QID) | ORAL | Status: DC
  Administered 2015-01-08 – 2015-01-11 (×13): 1000 mg via ORAL
  Filled 2015-01-07 (×18): qty 2

## 2015-01-07 MED ORDER — HEPARIN SODIUM (PORCINE) 1000 UNIT/ML IJ SOLN
INTRAMUSCULAR | Status: AC
  Filled 2015-01-07: qty 1

## 2015-01-07 MED ORDER — PHENYLEPHRINE 40 MCG/ML (10ML) SYRINGE FOR IV PUSH (FOR BLOOD PRESSURE SUPPORT)
PREFILLED_SYRINGE | INTRAVENOUS | Status: AC
  Filled 2015-01-07: qty 10

## 2015-01-07 MED ORDER — SODIUM CHLORIDE 0.9 % IJ SOLN
3.0000 mL | INTRAMUSCULAR | Status: DC | PRN
  Administered 2015-01-09: 3 mL via INTRAVENOUS
  Filled 2015-01-07: qty 3

## 2015-01-07 MED ORDER — TRAMADOL HCL 50 MG PO TABS
50.0000 mg | ORAL_TABLET | ORAL | Status: DC | PRN
  Filled 2015-01-07: qty 2

## 2015-01-07 MED ORDER — MIDAZOLAM HCL 2 MG/2ML IJ SOLN: 2.0000 mg | INTRAMUSCULAR | Status: DC | PRN

## 2015-01-07 MED ORDER — ARTIFICIAL TEARS OP OINT
TOPICAL_OINTMENT | OPHTHALMIC | Status: AC
  Filled 2015-01-07: qty 3.5

## 2015-01-07 MED ORDER — POTASSIUM CHLORIDE 10 MEQ/50ML IV SOLN
10.0000 meq | INTRAVENOUS | Status: AC
  Administered 2015-01-07 (×3): 10 meq via INTRAVENOUS

## 2015-01-07 MED ORDER — ARTIFICIAL TEARS OP OINT
TOPICAL_OINTMENT | OPHTHALMIC | Status: DC | PRN
  Administered 2015-01-07: 1 via OPHTHALMIC

## 2015-01-07 MED ORDER — INSULIN REGULAR BOLUS VIA INFUSION
0.0000 [IU] | Freq: Three times a day (TID) | INTRAVENOUS | Status: DC
  Filled 2015-01-07: qty 10

## 2015-01-07 MED ORDER — LACTATED RINGERS IV SOLN
INTRAVENOUS | Status: DC | PRN
Start: 2015-01-07 — End: 2015-01-07
  Administered 2015-01-07 (×2): via INTRAVENOUS

## 2015-01-07 MED ORDER — ONDANSETRON HCL 4 MG/2ML IJ SOLN
4.0000 mg | Freq: Four times a day (QID) | INTRAMUSCULAR | Status: DC | PRN
  Administered 2015-01-08 – 2015-01-09 (×2): 4 mg via INTRAVENOUS
  Filled 2015-01-07 (×3): qty 2

## 2015-01-07 SURGICAL SUPPLY — 111 items
ADAPTER CARDIO PERF ANTE/RETRO (ADAPTER) ×3 IMPLANT
ADAPTER MULTI PERFUSION 15 (ADAPTER) ×2 IMPLANT
ADH SKN CLS APL DERMABOND .7 (GAUZE/BANDAGES/DRESSINGS) ×1
ADPR PRFSN 84XANTGRD RTRGD (ADAPTER) ×1
APPLICATOR COTTON TIP 6IN STRL (MISCELLANEOUS) ×2 IMPLANT
BAG DECANTER FOR FLEXI CONT (MISCELLANEOUS) ×3 IMPLANT
BANDAGE ELASTIC 4 VELCRO ST LF (GAUZE/BANDAGES/DRESSINGS) ×5 IMPLANT
BANDAGE ELASTIC 6 VELCRO ST LF (GAUZE/BANDAGES/DRESSINGS) ×5 IMPLANT
BASKET HEART  (ORDER IN 25'S) (MISCELLANEOUS) ×1
BASKET HEART (ORDER IN 25'S) (MISCELLANEOUS) ×1
BASKET HEART (ORDER IN 25S) (MISCELLANEOUS) ×1 IMPLANT
BLADE STERNUM SYSTEM 6 (BLADE) ×3 IMPLANT
BLADE SURG 11 STRL SS (BLADE) ×2 IMPLANT
BLADE SURG 12 STRL SS (BLADE) ×3 IMPLANT
BLADE SURG ROTATE 9660 (MISCELLANEOUS) ×2 IMPLANT
BNDG GAUZE ELAST 4 BULKY (GAUZE/BANDAGES/DRESSINGS) ×5 IMPLANT
CANISTER SUCTION 2500CC (MISCELLANEOUS) ×3 IMPLANT
CANNULA GUNDRY RCSP 15FR (MISCELLANEOUS) ×3 IMPLANT
CANNULA VESSEL 3MM BLUNT TIP (CANNULA) ×2 IMPLANT
CATH CPB KIT VANTRIGT (MISCELLANEOUS) ×3 IMPLANT
CATH ROBINSON RED A/P 18FR (CATHETERS) ×9 IMPLANT
CATH THORACIC 36FR RT ANG (CATHETERS) ×3 IMPLANT
CLIP TI WIDE RED SMALL 24 (CLIP) ×6 IMPLANT
CONT SPEC STER OR (MISCELLANEOUS) ×2 IMPLANT
COVER SURGICAL LIGHT HANDLE (MISCELLANEOUS) ×3 IMPLANT
CRADLE DONUT ADULT HEAD (MISCELLANEOUS) ×3 IMPLANT
DERMABOND ADVANCED (GAUZE/BANDAGES/DRESSINGS) ×2
DERMABOND ADVANCED .7 DNX12 (GAUZE/BANDAGES/DRESSINGS) IMPLANT
DRAIN CHANNEL 32F RND 10.7 FF (WOUND CARE) ×3 IMPLANT
DRAPE CARDIOVASCULAR INCISE (DRAPES) ×3
DRAPE SLUSH/WARMER DISC (DRAPES) ×3 IMPLANT
DRAPE SRG 135X102X78XABS (DRAPES) ×1 IMPLANT
DRSG AQUACEL AG ADV 3.5X14 (GAUZE/BANDAGES/DRESSINGS) ×3 IMPLANT
ELECT BLADE 4.0 EZ CLEAN MEGAD (MISCELLANEOUS) ×3
ELECT BLADE 6.5 EXT (BLADE) ×3 IMPLANT
ELECT CAUTERY BLADE 6.4 (BLADE) ×3 IMPLANT
ELECT REM PT RETURN 9FT ADLT (ELECTROSURGICAL) ×6
ELECTRODE BLDE 4.0 EZ CLN MEGD (MISCELLANEOUS) ×1 IMPLANT
ELECTRODE REM PT RTRN 9FT ADLT (ELECTROSURGICAL) ×2 IMPLANT
GAUZE SPONGE 4X4 12PLY STRL (GAUZE/BANDAGES/DRESSINGS) ×6 IMPLANT
GLOVE BIO SURGEON STRL SZ 6 (GLOVE) ×4 IMPLANT
GLOVE BIO SURGEON STRL SZ 6.5 (GLOVE) ×2 IMPLANT
GLOVE BIO SURGEON STRL SZ7.5 (GLOVE) ×9 IMPLANT
GLOVE BIO SURGEONS STRL SZ 6.5 (GLOVE) ×2
GLOVE BIOGEL PI IND STRL 6 (GLOVE) IMPLANT
GLOVE BIOGEL PI IND STRL 6.5 (GLOVE) IMPLANT
GLOVE BIOGEL PI IND STRL 7.0 (GLOVE) IMPLANT
GLOVE BIOGEL PI INDICATOR 6 (GLOVE) ×4
GLOVE BIOGEL PI INDICATOR 6.5 (GLOVE) ×10
GLOVE BIOGEL PI INDICATOR 7.0 (GLOVE) ×10
GLOVE ORTHO TXT STRL SZ7.5 (GLOVE) ×6 IMPLANT
GOWN STRL REUS W/ TWL LRG LVL3 (GOWN DISPOSABLE) ×4 IMPLANT
GOWN STRL REUS W/TWL LRG LVL3 (GOWN DISPOSABLE) ×27
HEMOSTAT POWDER SURGIFOAM 1G (HEMOSTASIS) ×9 IMPLANT
HEMOSTAT SURGICEL 2X14 (HEMOSTASIS) ×3 IMPLANT
INSERT FOGARTY XLG (MISCELLANEOUS) IMPLANT
KIT BASIN OR (CUSTOM PROCEDURE TRAY) ×3 IMPLANT
KIT ROOM TURNOVER OR (KITS) ×3 IMPLANT
KIT SUCTION CATH 14FR (SUCTIONS) ×3 IMPLANT
KIT VASOVIEW W/TROCAR VH 2000 (KITS) ×3 IMPLANT
LEAD PACING MYOCARDI (MISCELLANEOUS) ×3 IMPLANT
MARKER GRAFT CORONARY BYPASS (MISCELLANEOUS) ×9 IMPLANT
MARKER SKIN DUAL TIP RULER LAB (MISCELLANEOUS) ×2 IMPLANT
NS IRRIG 1000ML POUR BTL (IV SOLUTION) ×15 IMPLANT
PACK OPEN HEART (CUSTOM PROCEDURE TRAY) ×3 IMPLANT
PAD ARMBOARD 7.5X6 YLW CONV (MISCELLANEOUS) ×6 IMPLANT
PAD ELECT DEFIB RADIOL ZOLL (MISCELLANEOUS) ×3 IMPLANT
PENCIL BUTTON HOLSTER BLD 10FT (ELECTRODE) ×3 IMPLANT
PUNCH AORTIC ROT 4.0MM RCL 40 (MISCELLANEOUS) ×2 IMPLANT
PUNCH AORTIC ROTATE 4.0MM (MISCELLANEOUS) IMPLANT
PUNCH AORTIC ROTATE 4.5MM 8IN (MISCELLANEOUS) IMPLANT
PUNCH AORTIC ROTATE 5MM 8IN (MISCELLANEOUS) IMPLANT
SPONGE GAUZE 4X4 12PLY STER LF (GAUZE/BANDAGES/DRESSINGS) ×4 IMPLANT
SPONGE LAP 18X18 X RAY DECT (DISPOSABLE) ×2 IMPLANT
SPONGE LAP 4X18 X RAY DECT (DISPOSABLE) ×2 IMPLANT
SURGIFLO W/THROMBIN 8M KIT (HEMOSTASIS) ×5 IMPLANT
SUT BONE WAX W31G (SUTURE) ×3 IMPLANT
SUT ETHILON 3 0 FSLX (SUTURE) ×2 IMPLANT
SUT MNCRL AB 4-0 PS2 18 (SUTURE) ×4 IMPLANT
SUT PROLENE 3 0 SH DA (SUTURE) IMPLANT
SUT PROLENE 3 0 SH1 36 (SUTURE) IMPLANT
SUT PROLENE 4 0 RB 1 (SUTURE) ×3
SUT PROLENE 4 0 SH DA (SUTURE) ×5 IMPLANT
SUT PROLENE 4-0 RB1 .5 CRCL 36 (SUTURE) ×1 IMPLANT
SUT PROLENE 5 0 C 1 36 (SUTURE) IMPLANT
SUT PROLENE 6 0 C 1 30 (SUTURE) IMPLANT
SUT PROLENE 6 0 CC (SUTURE) ×17 IMPLANT
SUT PROLENE 8 0 BV175 6 (SUTURE) ×2 IMPLANT
SUT PROLENE BLUE 7 0 (SUTURE) ×9 IMPLANT
SUT SILK  1 MH (SUTURE)
SUT SILK 1 MH (SUTURE) IMPLANT
SUT SILK 2 0 SH CR/8 (SUTURE) ×2 IMPLANT
SUT SILK 3 0 SH CR/8 (SUTURE) ×2 IMPLANT
SUT STEEL 6MS V (SUTURE) ×8 IMPLANT
SUT STEEL SZ 6 DBL 3X14 BALL (SUTURE) ×7 IMPLANT
SUT VIC AB 1 CTX 36 (SUTURE) ×6
SUT VIC AB 1 CTX36XBRD ANBCTR (SUTURE) ×2 IMPLANT
SUT VIC AB 2-0 CT1 27 (SUTURE) ×3
SUT VIC AB 2-0 CT1 TAPERPNT 27 (SUTURE) IMPLANT
SUT VIC AB 2-0 CTX 27 (SUTURE) IMPLANT
SUT VIC AB 3-0 X1 27 (SUTURE) IMPLANT
SUTURE E-PAK OPEN HEART (SUTURE) ×3 IMPLANT
SYSTEM SAHARA CHEST DRAIN ATS (WOUND CARE) ×3 IMPLANT
TAPE CLOTH SURG 4X10 WHT LF (GAUZE/BANDAGES/DRESSINGS) ×2 IMPLANT
TAPE PAPER 2X10 WHT MICROPORE (GAUZE/BANDAGES/DRESSINGS) ×2 IMPLANT
TOWEL OR 17X24 6PK STRL BLUE (TOWEL DISPOSABLE) ×6 IMPLANT
TOWEL OR 17X26 10 PK STRL BLUE (TOWEL DISPOSABLE) ×6 IMPLANT
TRAY FOLEY IC TEMP SENS 16FR (CATHETERS) ×3 IMPLANT
TUBING INSUFFLATION (TUBING) ×3 IMPLANT
UNDERPAD 30X30 INCONTINENT (UNDERPADS AND DIAPERS) ×3 IMPLANT
WATER STERILE IRR 1000ML POUR (IV SOLUTION) ×6 IMPLANT

## 2015-01-07 NOTE — Progress Notes (Addendum)
patient examined and medical record reviewed,agree with above note. [name] 01/07/2015  Plan CABG on P [place]

## 2015-01-07 NOTE — Progress Notes (Signed)
  Echocardiogram Echocardiogram Transesophageal has been performed.  [name], [name] 01/07/2015, 8:19 AM

## 2015-01-07 NOTE — Telephone Encounter (Signed)
[facility]

## 2015-01-07 NOTE — Procedures (Signed)
Extubation Procedure Note  Patient Details:   Name: [name] DOB: October 01, 1950 MRN: [id number]  Pt extubated to 4L [place] per protocol, NIF & VC WNL, VS stable with mild HTN. Pt tolerating well at this time, RT to monitor.    Evaluation  O2 sats: stable throughout Complications: No apparent complications Patient did tolerate procedure well. Bilateral Breath Sounds: Clear   Yes  Harley HallmarkKeen, Milda Lindvall Lyman 01/07/2015, 6:27 PM

## 2015-01-07 NOTE — Anesthesia Preprocedure Evaluation (Addendum)
Anesthesia Evaluation  Patient identified by MRN, date of birth, ID band  Reviewed: Allergy & Precautions, NPO status , Patient's Chart, lab work & pertinent test results  History of Anesthesia Complications Negative for: history of anesthetic complications  Airway Mallampati: II  TM Distance: >3 FB Neck ROM: Full    Dental  (+) Dental Advisory Given   Pulmonary neg pulmonary ROS,  breath sounds clear to auscultation        Cardiovascular hypertension, Pt. on medications and Pt. on home beta blockers + angina + CAD, + Cardiac Stents and + Peripheral Vascular Disease + dysrhythmias Atrial Fibrillation Rhythm:Regular Rate:Normal  EF 50-55%, valves OK   Neuro/Psych negative neurological ROS     GI/Hepatic negative GI ROS, Neg liver ROS,   Endo/Other  diabetes (glu 246), Oral Hypoglycemic Agents  Renal/GU negative Renal ROS     Musculoskeletal   Abdominal   Peds  Hematology  (+) Blood dyscrasia (Hb 10.8), ,   Anesthesia Other Findings   Reproductive/Obstetrics                           Anesthesia Physical Anesthesia Plan  ASA: III  Anesthesia Plan: General   Post-op Pain Management:    Induction: Intravenous  Airway Management Planned: Oral ETT  Additional Equipment: Arterial line, CVP, PA Cath, TEE and Ultrasound Guidance Line Placement  Intra-op Plan:   Post-operative Plan: Post-operative intubation/ventilation  Informed Consent: I have reviewed the patients History and Physical, chart, labs and discussed the procedure including the risks, benefits and alternatives for the proposed anesthesia with the patient or authorized representative who has indicated his/her understanding and acceptance.   Dental advisory given  Plan Discussed with: CRNA and Surgeon  Anesthesia Plan Comments: (Plan routine monitors, A line, PA cath, GETA with TEE and post op ventilation)         Anesthesia Quick Evaluation

## 2015-01-07 NOTE — Progress Notes (Signed)
TCTS BRIEF SICU PROGRESS NOTE  Day of Surgery  S/P Procedure(s) (LRB): CORONARY ARTERY BYPASS GRAFTING (CABG) x4 using bilateral greater saphenous vein and left internal mammary artery. (N/A)   Waking up on vent NSR w/ stable hemodynamics, BP somewhat elevated - increasing dose of NTG Chest tube output low UOP excellent Labs okay  Plan: Continue routine early postop  [name] 01/07/2015 6:19 PM

## 2015-01-07 NOTE — Anesthesia Procedure Notes (Signed)
Procedure Name: Intubation Performed by: Everlene BallsHAYES, Conan Mcmanaway T Pre-anesthesia Checklist: Patient identified, Emergency Drugs available, Suction available, Patient being monitored and Timeout performed Patient Re-evaluated:Patient Re-evaluated prior to inductionOxygen Delivery Method: Circle system utilized Preoxygenation: Pre-oxygenation with 100% oxygen Intubation Type: IV induction Ventilation: Mask ventilation without difficulty Laryngoscope Size: Mac and 3 Grade View: Grade II Tube type: Subglottic suction tube Tube size: 7.5 mm Number of attempts: 1 Airway Equipment and Method: Stylet Placement Confirmation: ETT inserted through vocal cords under direct vision,  breath sounds checked- equal and bilateral and positive ETCO2 Secured at: 22 cm Tube secured with: Tape Dental Injury: Teeth and Oropharynx as per pre-operative assessment

## 2015-01-07 NOTE — Transfer of Care (Signed)
Immediate Anesthesia Transfer of Care Note  Patient: [name]  Procedure(s) Performed: Procedure(s): CORONARY ARTERY BYPASS GRAFTING (CABG) x4 using bilateral greater saphenous vein and left internal mammary artery. (N/A)  Patient Location: SICU  Anesthesia Type:General  Level of Consciousness: sedated and Patient remains intubated per anesthesia plan  Airway & Oxygen Therapy: Patient remains intubated per anesthesia plan and Patient placed on Ventilator (see vital sign flow sheet for setting)  Post-op Assessment: Report given to RN and Post -op Vital signs reviewed and stable  Post vital signs: Reviewed and stable  Last Vitals:  Filed Vitals:   01/07/15 1439  BP: 93/46  Pulse: 90  Temp:   Resp:     Complications: No apparent anesthesia complications

## 2015-01-07 NOTE — Brief Op Note (Signed)
01/05/2015 - 01/07/2015  1:41 PM  PATIENT:  Dominique ArisPamela K Bessent  64 y.o. female  PRE-OPERATIVE DIAGNOSIS:  unstable angina and CAD  POST-OPERATIVE DIAGNOSIS:  unstable angina and CAD  PROCEDURE:  Procedure(s):  CORONARY ARTERY BYPASS GRAFTING (CABG) x4  -LIMA to LAD -SVG to RAMUS -SVG to OM -SVG to RCA  ENDOSCOPIC HARVEST GREATER SAPHENOUS VEIN -Left Leg -Right Thigh  SURGEON:  Surgeon(s) and Role:    * Kerin PernaPeter Van Trigt, MD - Primary  PHYSICIAN ASSISTANT: Lowella DandyErin Umar Patmon PA-C  ANESTHESIA:   general  EBL:  Total I/O In: 2681 [I.V.:2200; Blood:481] Out: 725 [Urine:725]  BLOOD ADMINISTERED:2U PRBC,  CELLSAVER, 2 FFP and 2 PLTS  DRAINS: Left Pleural Chest Tube, Mediastinal Chest drains   LOCAL MEDICATIONS USED:  NONE  SPECIMEN:  Source of Specimen:  Infra mammary lymph nodes  DISPOSITION OF SPECIMEN:  PATHOLOGY  COUNTS:  YES  TOURNIQUET:  * No tourniquets in log *  DICTATION: .Dragon Dictation  PLAN OF CARE: Admit to inpatient   PATIENT DISPOSITION:  ICU - intubated and hemodynamically stable.   Delay start of Pharmacological VTE agent (>24hrs) due to surgical blood loss or risk of bleeding: yes

## 2015-01-08 ENCOUNTER — Inpatient Hospital Stay (HOSPITAL_COMMUNITY): Payer: Medicare Other

## 2015-01-08 ENCOUNTER — Encounter (HOSPITAL_COMMUNITY): Payer: Self-pay | Admitting: Cardiothoracic Surgery

## 2015-01-08 DIAGNOSIS — I2699 Other pulmonary embolism without acute cor pulmonale: Secondary | ICD-10-CM

## 2015-01-08 HISTORY — DX: Other pulmonary embolism without acute cor pulmonale: I26.99

## 2015-01-08 LAB — POCT I-STAT, CHEM 8
BUN: 10 mg/dL (ref 6–20)
Calcium, Ion: 1.19 mmol/L (ref 1.13–1.30)
Chloride: 99 mmol/L — ABNORMAL LOW (ref 101–111)
Creatinine, Ser: 0.5 mg/dL (ref 0.44–1.00)
Glucose, Bld: 194 mg/dL — ABNORMAL HIGH (ref 65–99)
HEMATOCRIT: 27 % — AB (ref 36.0–46.0)
Hemoglobin: 9.2 g/dL — ABNORMAL LOW (ref 12.0–15.0)
Potassium: 4.9 mmol/L (ref 3.5–5.1)
SODIUM: 131 mmol/L — AB (ref 135–145)
TCO2: 21 mmol/L (ref 0–100)

## 2015-01-08 LAB — PREPARE FRESH FROZEN PLASMA: Unit division: 0

## 2015-01-08 LAB — GLUCOSE, CAPILLARY
GLUCOSE-CAPILLARY: 205 mg/dL — AB (ref 65–99)
GLUCOSE-CAPILLARY: 123 mg/dL — AB (ref 65–99)
GLUCOSE-CAPILLARY: 176 mg/dL — AB (ref 65–99)
GLUCOSE-CAPILLARY: 146 mg/dL — AB (ref 65–99)
Glucose-Capillary: 103 mg/dL — ABNORMAL HIGH (ref 65–99)
Glucose-Capillary: 105 mg/dL — ABNORMAL HIGH (ref 65–99)
Glucose-Capillary: 121 mg/dL — ABNORMAL HIGH (ref 65–99)
Glucose-Capillary: 127 mg/dL — ABNORMAL HIGH (ref 65–99)
Glucose-Capillary: 128 mg/dL — ABNORMAL HIGH (ref 65–99)
Glucose-Capillary: 143 mg/dL — ABNORMAL HIGH (ref 65–99)
Glucose-Capillary: 152 mg/dL — ABNORMAL HIGH (ref 65–99)
Glucose-Capillary: 166 mg/dL — ABNORMAL HIGH (ref 65–99)
Glucose-Capillary: 168 mg/dL — ABNORMAL HIGH (ref 65–99)
Glucose-Capillary: 170 mg/dL — ABNORMAL HIGH (ref 65–99)
Glucose-Capillary: 189 mg/dL — ABNORMAL HIGH (ref 65–99)
Glucose-Capillary: 203 mg/dL — ABNORMAL HIGH (ref 65–99)
Glucose-Capillary: 203 mg/dL — ABNORMAL HIGH (ref 65–99)

## 2015-01-08 LAB — CBC
HCT: 26.9 % — ABNORMAL LOW (ref 36.0–46.0)
HCT: 25.6 % — ABNORMAL LOW (ref 36.0–46.0)
HEMOGLOBIN: 9.4 g/dL — AB (ref 12.0–15.0)
HEMOGLOBIN: 9 g/dL — AB (ref 12.0–15.0)
MCH: 29.7 pg (ref 26.0–34.0)
MCH: 30.1 pg (ref 26.0–34.0)
MCHC: 34.9 g/dL (ref 30.0–36.0)
MCHC: 35.2 g/dL (ref 30.0–36.0)
MCV: 84.9 fL (ref 78.0–100.0)
MCV: 85.6 fL (ref 78.0–100.0)
Platelets: 228 10*3/uL (ref 150–400)
Platelets: 213 10*3/uL (ref 150–400)
RBC: 3.17 MIL/uL — ABNORMAL LOW (ref 3.87–5.11)
RBC: 2.99 MIL/uL — AB (ref 3.87–5.11)
RDW: 14 % (ref 11.5–15.5)
RDW: 14.5 % (ref 11.5–15.5)
WBC: 14.1 10*3/uL — ABNORMAL HIGH (ref 4.0–10.5)
WBC: 10.8 10*3/uL — ABNORMAL HIGH (ref 4.0–10.5)

## 2015-01-08 LAB — BASIC METABOLIC PANEL
ANION GAP: 7 (ref 5–15)
BUN: 10 mg/dL (ref 6–20)
CALCIUM: 8.2 mg/dL — AB (ref 8.9–10.3)
CO2: 24 mmol/L (ref 22–32)
CREATININE: 0.53 mg/dL (ref 0.44–1.00)
Chloride: 103 mmol/L (ref 101–111)
GFR calc Af Amer: >60 mL/min (ref >60)
GLUCOSE: 99 mg/dL (ref 65–99)
Potassium: 4.2 mmol/L (ref 3.5–5.1)
SODIUM: 134 mmol/L — AB (ref 135–145)

## 2015-01-08 LAB — PREPARE PLATELET PHERESIS: Unit division: 0

## 2015-01-08 LAB — MAGNESIUM
MAGNESIUM: 2.2 mg/dL (ref 1.7–2.4)
MAGNESIUM: 2 mg/dL (ref 1.7–2.4)

## 2015-01-08 LAB — CREATININE, SERUM
CREATININE: 0.64 mg/dL (ref 0.44–1.00)
GFR calc Af Amer: >60 mL/min (ref >60)

## 2015-01-08 MED ORDER — INSULIN ASPART 100 UNIT/ML ~~LOC~~ SOLN
0.0000 [IU] | SUBCUTANEOUS | Status: DC
  Administered 2015-01-08: 8 [IU] via SUBCUTANEOUS
  Administered 2015-01-08: 4 [IU] via SUBCUTANEOUS
  Administered 2015-01-08: 2 [IU] via SUBCUTANEOUS

## 2015-01-08 MED ORDER — FUROSEMIDE 10 MG/ML IJ SOLN
20.0000 mg | Freq: Once | INTRAMUSCULAR | Status: AC
  Administered 2015-01-08: 20 mg via INTRAVENOUS
  Filled 2015-01-08: qty 2

## 2015-01-08 MED ORDER — MIDAZOLAM HCL 2 MG/2ML IJ SOLN: 2.0000 mg | INTRAMUSCULAR | Status: DC | PRN

## 2015-01-08 MED ORDER — INSULIN DETEMIR 100 UNIT/ML ~~LOC~~ SOLN
10.0000 [IU] | Freq: Two times a day (BID) | SUBCUTANEOUS | Status: DC
  Administered 2015-01-08 – 2015-01-09 (×3): 10 [IU] via SUBCUTANEOUS
  Filled 2015-01-08 (×5): qty 0.1

## 2015-01-08 MED ORDER — INSULIN ASPART 100 UNIT/ML ~~LOC~~ SOLN
4.0000 [IU] | Freq: Three times a day (TID) | SUBCUTANEOUS | Status: DC
  Administered 2015-01-08 – 2015-01-09 (×4): 4 [IU] via SUBCUTANEOUS

## 2015-01-08 MED ORDER — PANTOPRAZOLE SODIUM 40 MG PO TBEC
40.0000 mg | DELAYED_RELEASE_TABLET | Freq: Every day | ORAL | Status: DC
  Administered 2015-01-08 – 2015-01-10 (×3): 40 mg via ORAL
  Filled 2015-01-08 (×2): qty 1

## 2015-01-08 NOTE — Care Management Note (Signed)
Case Management Note  Patient Details  Name: [name] MRN: [id number] Date of Birth: [birth year]  Subjective/Objective:     Patient lives with mother at [address] [place] [id number].  Mother has some memory loss and needs to be reminded to take pills (she knows what pills to take) and eat, but is independent with ADL's and cooking. At this time she has her cousin and daughter with her mother off and on.  Plan is for discharge back home with her mother.  States if something happens to her her mother will know to call 911 if needed.  Patient is interested in a nurse coming out post discharge just to make sure she is doing all right?  Patient is nauseated at this time.  CM will continue to follow.               Action/Plan:   Expected Discharge Date:  01/10/15               Expected Discharge Plan:  Home/Self Care  In-House Referral:     Discharge planning Services  CM Consult  Post Acute Care Choice:    Choice offered to:     DME Arranged:    DME Agency:     HH Arranged:    HH Agency:     Status of Service:  In process, will continue to follow  Medicare Important Message Given:    Date Medicare IM Given:    Medicare IM give by:    Date Additional Medicare IM Given:    Additional Medicare Important Message give by:     If discussed at Long Length of Stay Meetings, dates discussed:    Additional Comments:  Vangie BickerBrown, [name], RN 01/08/2015, 11:15 AM

## 2015-01-08 NOTE — Care Management Note (Signed)
Case Management Note  Patient Details  Name: Simonne Maffucciamela K Dsouza MRN: [id number] Date of Birth: 1950-10-14  Subjective/Objective:      64yoF admitted with Unstable Angina. POD#1 CABGx4.                  Action/Plan:  Will verify info in chart "from home with parent".    Expected Discharge Date:                Expected Discharge Plan:  Home/Self Care  In-House Referral:  Clinical Social Work  Discharge planning Services  CM Consult  Post Acute Care Choice:    Choice offered to:     DME Arranged:    DME Agency:     HH Arranged:    HH Agency:     Status of Service:  In process, will continue to follow  Medicare Important Message Given:    Date Medicare IM Given:    Medicare IM give by:    Date Additional Medicare IM Given:    Additional Medicare Important Message give by:     If discussed at Long Length of Stay Meetings, dates discussed:    Additional Comments:  Yvone NeuCrutchfield, Mileena Rothenberger M, RN 01/08/2015, 10:22 AM

## 2015-01-08 NOTE — Progress Notes (Signed)
Patient ID: [name], female   DOB: 1951/05/31, 64 y.o.   MRN: [id number]  SICU Evening Rounds  Hemodynamically stable in sinus rhythm  Urine output ok  BMET    Component Value Date/Time   NA 131* 01/08/2015 1655   NA 136 09/26/2013 0928   K 4.9 01/08/2015 1655   K 4.5 09/26/2013 0928   CL 99* 01/08/2015 1655   CL 104 09/26/2013 0928   CO2 24 01/08/2015 0400   CO2 31 09/26/2013 0928   GLUCOSE 194* 01/08/2015 1655   GLUCOSE 145* 09/26/2013 0928   BUN 10 01/08/2015 1655   BUN 19* 09/26/2013 0928   CREATININE 0.50 01/08/2015 1655   CREATININE 0.85 09/26/2013 0928   CALCIUM 8.2* 01/08/2015 0400   CALCIUM 9.8 09/26/2013 0928   GFRNONAA >60 01/08/2015 1650   GFRNONAA >60 09/26/2013 0928   GFRAA >60 01/08/2015 1650   GFRAA >60 09/26/2013 0928    CBC    Component Value Date/Time   WBC 10.8* 01/08/2015 1650   RBC 2.99* 01/08/2015 1650   HGB 9.2* 01/08/2015 1655   HCT 27.0* 01/08/2015 1655   PLT 213 01/08/2015 1650   MCV 85.6 01/08/2015 1650   MCH 30.1 01/08/2015 1650   MCHC 35.2 01/08/2015 1650   RDW 14.5 01/08/2015 1650   LYMPHSABS 1.7 01/05/2015 1640   MONOABS 0.7 01/05/2015 1640   EOSABS 0.2 01/05/2015 1640   BASOSABS 0.1 01/05/2015 1640    A/P: Stable. Continue current plans.

## 2015-01-08 NOTE — Op Note (Signed)
NAMSuanne Marker:  Phimmasone, Rodneisha               ACCOUNT NO.:  [id number]  MEDICAL RECORD NO.:  [id number]  LOCATION:  2S09C                        FACILITY:  [facility]  PHYSICIAN:  Kerin PernaPeter Van Trigt, M.D.  DATE OF BIRTH:  1951-07-28  DATE OF PROCEDURE:  01/07/2015 DATE OF DISCHARGE:                              OPERATIVE REPORT   OPERATIONS: 1. Coronary artery bypass grafting x4 (left internal mammary artery to     LAD, saphenous vein graft to ramus intermediate, saphenous vein     graft to OM1, saphenous vein graft to posterior descending). 2. Bilateral leg endoscopic vein harvest of greater saphenous vein.  SURGEON:  Kerin PernaPeter Van Trigt, M.D.  ASSISTANT:  Pauline GoodAaron Barrett, PA-C.  PREOPERATIVE DIAGNOSES:  Unstable angina, severe three-vessel coronary artery disease, diabetes.  POSTOPERATIVE DIAGNOSES:  Unstable angina, severe three-vessel coronary artery disease, diabetes.  ANESTHESIA:  General by Dr. Ledora BottcherKaisol Jackson.  INDICATIONS:  The patient is a 64 year old female with prior history of coronary disease prior stent placement to the right coronary 6 years ago.  She was recently reevaluated for recurrent angina, which was accelerating in intensity and frequency.  She had an outpatient cardiac catheterizations [facility].  She was found to have severe three-vessel coronary artery disease with restenosis and occlusion of the previous stent.  She had been on Plavix.  She was scheduled to have a thoracic surgical office consultation, but presented to the emergency department with chest pain as well as some tenderness in her groin at the cath site.  She was admitted to Cardiology.  She was found to have a hematoma in the groin, but no false aneurysm.  She was placed on heparin and nitroglycerin for her unstable angina.  I saw the patient in consultation and agreed with recommendation for multivessel coronary artery bypass grafting.  I discussed the procedure of CABG in detail with  the patient and her family including the indications, benefits, alternatives, and risks.  I discussed the major details of surgery including the use of general anesthesia and cardiopulmonary bypass, the location of the surgical incisions, and the expected postoperative hospital recovery.  I reviewed with the patient the risks to her of the operation was turned the risks including the risk of stroke, MI, bleeding, blood transfusion requirement, postoperative pulmonary problems including pleural effusion, infection, and death.  After reviewing these issues, she demonstrated her understanding and agreed to proceed with surgery under what I felt was an informed consent.  OPERATIVE FINDINGS: 1. Severe calcified multivessel coronary artery disease with small     vessels, suboptimal targets. 2. Bilateral vein harvest required to obtain adequate conduit-the vein     to the posterior descending was small, but usable. 3. Intraoperative anemia requiring packed cell transfusion x2. 4. Post pump coagulopathy from previous Plavix use requiring platelet     transfusion.  OPERATIVE PROCEDURE:  The patient was brought to the operating room and placed supine on the table where general anesthesia was induced under invasive hemodynamic monitoring.  The chest, abdomen, and legs were prepped with Betadine and draped as a sterile field.  A proper time-out was performed.  A sternal incision was made as the saphenous vein was harvested  endoscopically from the right leg and the left leg.  The left internal mammary artery was harvested as a pedicle graft from its origin at the subclavian vessels.  There was good flow with good flow.  The preoperative Doppler studies had shown a discrepancy between left arm and right arm blood pressure, however in the operating room, we measured the simultaneous pressures on several occasions and there was no significant difference in the upper extremity blood pressures.  For  that reason, the mammary was used as a pedicle graft.  The sternal retractor was placed using the deep blades and the pericardium opened and suspended.  Pursestrings were placed in the ascending aorta and right atrium, and after the vein had been harvested, heparin was administered and ACT was documented as being therapeutic. The patient was cannulated and placed on cardiopulmonary bypass.  The coronaries were identified for grafting and the mammary artery and vein grafts were prepared for the distal anastomoses.  Cardioplegia cannulas were placed for both antegrade and retrograde cold blood cardioplegia. The patient was cooled to 32 degrees.  The aortic crossclamp was applied.  An 800 mL of cold blood cardioplegia was delivered in split doses between the antegrade aortic and retrograde coronary sinus catheters.  There was good cardioplegic arrest and septal temperature dropped less than 14 degrees.  Cardioplegia was delivered every 20 minutes or less.  The distal coronary anastomoses were then performed.  The first distal anastomosis was the posterior descending branch of the right coronary. This was totally occluded proximally.  A reverse saphenous vein was sewn end-to-side with running 7-0 Prolene with good flow through the graft. Cardioplegia was redosed.  The second distal anastomosis was to the OM1 branch of the left coronary.  This had a proximal 90% stenosis.  A reverse saphenous vein was sewn end-to-side with running 7-0 Prolene with good flow through the graft.  Cardioplegia was redosed.  The third distal anastomosis was the ramus intermediate branch of the left coronary.  This had a proximal 90% stenosis.  It was heavily calcified.  It was intramyocardial.  A reverse saphenous vein was sewn end-to-side with running 7-0 Prolene with good flow through the graft. Cardioplegia was redosed.  The fourth distal anastomosis was placed to the distal LAD.  The LAD more proximally  was intramyocardial.  The left IMA pedicle was brought through an opening in the left lateral pericardium, was brought down onto the LAD and sewn end-to-side with running 8-0 Prolene.  There was good flow through the anastomosis after briefly releasing the pedicle bulldog on the mammary artery.  The bulldog was reapplied and the pedicle secured to the epicardium with 6-0 Prolene.  Cardioplegia was redosed.  The crossclamp was still in place and while the operative field was insufflated with CO2, three proximal vein anastomoses were performed on the ascending aorta with a 4.0 mm punch running 6-0 Prolene.  Prior to removal of the crossclamp, air was vented from the coronaries with retrograde warm dose of cardioplegia.  Crossclamp was removed.  The heart resumed a rhythm after 1 cardioversion.  The vein grafts were de-aired and opened, each had good flow.  Hemostasis was documented at the proximal and distal sites.  The patient had the temporary pacer wires were applied and was rewarmed.  The lungs re-expanded and the ventilator was resumed.  The patient was weaned from cardiopulmonary bypass without difficulty without inotropes.  Cardiac output was 5 L and echo showed good LV global function.  Protamine was administered  without adverse reaction.  The cannulas were removed.  The patient had reversal of heparin with protamine, however, still coagulopathic and platelets were administered with improvement in coagulation function.  The superior pericardial fat was closed over the aorta.  Anterior mediastinal and left pleural chest tube were placed and brought through separate incisions.  The sternum was closed with interrupted wire.  The pectoralis fascia was closed in running #1 Vicryl.  The subcutaneous and skin layers were closed in running Vicryl.  Total cardiopulmonary bypass time was 120 minutes.     Kerin Perna, M.D.     PV/MEDQ  D:  01/07/2015  T:  01/08/2015  Job:   [id number]  cc:   Antonieta Iba, MD

## 2015-01-08 NOTE — Progress Notes (Addendum)
T[facility] DAILY ICU PROGRESS NOTE                   [address]            [place] [address]          [phone]   1 Day Post-Op Procedure(s) (LRB): CORONARY ARTERY BYPASS GRAFTING (CABG) x4 using bilateral greater saphenous vein and left internal mammary artery. (N/A)  Total Length of Stay:  LOS: 3 days   Subjective: Awake, alert, extubated.  Sore, but otherwise feels ok.   Objective: Vital signs in last 24 hours: Temp:  [96.6 F (35.9 C)-98.6 F (37 C)] 97.9 F (36.6 C) (06/01 0800) Pulse Rate:  [72-90] 74 (06/01 0800) Cardiac Rhythm:  [-] Normal sinus rhythm (06/01 0600) Resp:  [8-25] 11 (06/01 0800) BP: (93-169)/(46-73) 113/52 mmHg (06/01 0800) SpO2:  [93 %-100 %] 100 % (06/01 0800) FiO2 (%):  [40 %-50 %] 40 % (05/31 1754) Weight:  [194 lb (87.998 kg)] 194 lb (87.998 kg) (06/01 0500)  Filed Weights   01/06/15 0319 01/07/15 0500 01/08/15 0500  Weight: 178 lb 14.1 oz (81.14 kg) 182 lb 1.6 oz (82.6 kg) 194 lb (87.998 kg)  Preop wt= 82.6 kg  Weight change: 11 lb 14.4 oz (5.398 kg)   Hemodynamic parameters for last 24 hours: PAP: (11-33)/(5-21) 22/10 mmHg CO:  [4.1 L/min-6.7 L/min] 4.1 L/min CI:  [2.1 L/min/m2-3.4 L/min/m2] 2.1 L/min/m2  Intake/Output from previous day: 05/31 0701 - 06/01 0700 In: 6161.9 [I.V.:4322.9; Blood:739; IV Piggyback:1100] Out: 9147 [WGNFA:2130; Blood:500; Chest Tube:396]  Intake/Output this shift: Total I/O In: 85 [I.V.:85] Out: 30 [Urine:30]   CBGs 168-170-189-220-205-143-199  Current Meds: Scheduled Meds: . acetaminophen  1,000 mg Oral 4 times per day   Or  . acetaminophen (TYLENOL) oral liquid 160 mg/5 mL  1,000 mg Per Tube 4 times per day  . antiseptic oral rinse  7 mL Mouth Rinse QID  . aspirin EC  325 mg Oral Daily   Or  . aspirin  324 mg Per Tube Daily  . bisacodyl  10 mg Oral Daily   Or  . bisacodyl  10 mg Rectal Daily  . cefUROXime (ZINACEF)  IV  1.5 g Intravenous Q12H  . chlorhexidine  15 mL Mouth  Rinse BID  . Chlorhexidine Gluconate Cloth  6 each Topical Daily  . docusate sodium  200 mg Oral Daily  . famotidine (PEPCID) IV  20 mg Intravenous Q12H  . insulin regular  0-10 Units Intravenous TID WC  . metoCLOPramide (REGLAN) injection  10 mg Intravenous 4 times per day  . metoprolol tartrate  12.5 mg Oral BID   Or  . metoprolol tartrate  12.5 mg Per Tube BID  . [START ON 01/09/2015] pantoprazole  40 mg Oral Daily  . rosuvastatin  20 mg Oral Daily  . sodium chloride  3 mL Intravenous Q12H  . vancomycin  1,000 mg Intravenous Q12H   Continuous Infusions: . sodium chloride    . sodium chloride Stopped (01/07/15 1334)  . sodium chloride 10 mL/hr at 01/08/15 0800  . dexmedetomidine Stopped (01/07/15 1800)  . insulin (NOVOLIN-R) infusion 1.2 Units/hr (01/08/15 0800)  . lactated ringers 20 mL/hr at 01/08/15 0800  . lactated ringers 20 mL/hr at 01/08/15 0800  . nitroGLYCERIN 50 mcg/min (01/08/15 0800)  . phenylephrine (NEO-SYNEPHRINE) Adult infusion Stopped (01/07/15 1445)   PRN Meds:.sodium chloride, albumin human, metoprolol, midazolam, morphine injection, ondansetron (ZOFRAN) IV, oxyCODONE, sodium chloride, traMADol   Physical Exam:  General appearance: alert, cooperative and no distress Heart: regular rate and rhythm Lungs: diminished breath sounds bibasilar Abdomen: soft, non-tender; bowel sounds normal; no masses,  no organomegaly Extremities: No significant LE edema Wound: Dressed and dry  Lab Results: CBC: Recent Labs  01/07/15 2000 01/07/15 2001 01/08/15 0400  WBC 12.3*  --  14.1*  HGB 9.4* 9.2* 9.4*  HCT 27.0* 27.0* 26.9*  PLT 193  --  228   BMET:  Recent Labs  01/07/15 0220  01/07/15 2001 01/08/15 0400  NA 133*  < > 135 134*  K 3.7  < > 4.9 4.2  CL 100*  < > 106 103  CO2 25  --   --  24  GLUCOSE 275*  < > 220* 99  BUN 14  < > 11 10  CREATININE 0.72  < > 0.40* 0.53  CALCIUM 8.7*  --   --  8.2*  < > = values in this interval not displayed.  PT/INR:    Recent Labs  01/07/15 1440  LABPROT 17.2*  INR 1.39   Radiology: Dg Chest Port 1 View  01/08/2015   CLINICAL DATA:  Post CABG  EXAM: PORTABLE CHEST - 1 VIEW  COMPARISON:  Portable exam 0710 hours compared to 01/07/2015  FINDINGS: Interval removal of endotracheal and nasogastric tubes.  Mediastinal drain and LEFT thoracostomy tube remain.  RIGHT jugular Swan-Ganz catheter remains tip projecting over bifurcation of main pulmonary artery.  Enlargement of cardiac silhouette post CABG.  Slight pulmonary vascular congestion.  Minimal linear subsegmental atelectasis at both lung bases.  Lungs otherwise clear.  No gross pleural effusion or pneumothorax.  IMPRESSION: Minimal bibasilar atelectasis.  Enlargement of cardiac silhouette post CABG.   Electronically Signed   By: Ulyses SouthwardMark  Boles M.D.   On: 01/08/2015 08:15   Dg Chest Port 1 View  01/07/2015   CLINICAL DATA:  Postop CABG.  EXAM: PORTABLE CHEST - 1 VIEW  COMPARISON:  01/05/2015 and 12/27/2014.  FINDINGS: 1502 hr. Interval median sternotomy and CABG. Endotracheal tube tip is within the mid trachea. Right IJ Swan-Ganz catheter tip in the main pulmonary artery. Nasogastric tube projects below the diaphragm. Mediastinal drain and left chest tube are in place.  There is mild atelectasis at both lung bases. No pneumothorax or significant pleural effusion identified. The heart size and mediastinal contours are stable.  IMPRESSION: No demonstrated complication post CABG.  Support system as above.   Electronically Signed   By: Carey BullocksWilliam  Veazey M.D.   On: 01/07/2015 15:17     Assessment/Plan: S/P Procedure(s) (LRB): CORONARY ARTERY BYPASS GRAFTING (CABG) x4 using bilateral greater saphenous vein and left internal mammary artery. (N/A)  CV- On low dose NTG gtt- wean as able. SR. Start beta blocker.  Pulm- stable post extubation. Pulm toilet/IS.  Expected postop blood loss anemia- H/H generally stable.   DM- wean insulin gtt and start Levemir. A1C=9.0  Vol  overload- diurese.  Mobilize, d/c lines and tubes as able, routine POD #1 progression.  COLLINS,GINA H 01/08/2015 8:54 AM  Agree with above assessment and plan Follow progression orders patient examined and medical record reviewed,agree with above note. Kathlee Nationseter Van Trigt III 01/08/2015

## 2015-01-09 ENCOUNTER — Inpatient Hospital Stay (HOSPITAL_COMMUNITY): Payer: Medicare Other

## 2015-01-09 LAB — BASIC METABOLIC PANEL
Anion gap: 7 (ref 5–15)
BUN: 7 mg/dL (ref 6–20)
CO2: 27 mmol/L (ref 22–32)
Calcium: 8.3 mg/dL — ABNORMAL LOW (ref 8.9–10.3)
Chloride: 100 mmol/L — ABNORMAL LOW (ref 101–111)
Creatinine, Ser: 0.64 mg/dL (ref 0.44–1.00)
GFR calc Af Amer: >60 mL/min (ref >60)
GFR calc non Af Amer: >60 mL/min (ref >60)
Glucose, Bld: 92 mg/dL (ref 65–99)
Potassium: 4.3 mmol/L (ref 3.5–5.1)
Sodium: 134 mmol/L — ABNORMAL LOW (ref 135–145)

## 2015-01-09 LAB — GLUCOSE, CAPILLARY
Glucose-Capillary: 101 mg/dL — ABNORMAL HIGH (ref 65–99)
Glucose-Capillary: 114 mg/dL — ABNORMAL HIGH (ref 65–99)
Glucose-Capillary: 220 mg/dL — ABNORMAL HIGH (ref 65–99)
Glucose-Capillary: 132 mg/dL — ABNORMAL HIGH (ref 65–99)
Glucose-Capillary: 147 mg/dL — ABNORMAL HIGH (ref 65–99)

## 2015-01-09 LAB — CBC
HCT: 25.6 % — ABNORMAL LOW (ref 36.0–46.0)
Hemoglobin: 8.8 g/dL — ABNORMAL LOW (ref 12.0–15.0)
MCH: 29.8 pg (ref 26.0–34.0)
MCHC: 34.4 g/dL (ref 30.0–36.0)
MCV: 86.8 fL (ref 78.0–100.0)
Platelets: 203 10*3/uL (ref 150–400)
RBC: 2.95 MIL/uL — ABNORMAL LOW (ref 3.87–5.11)
RDW: 14.7 % (ref 11.5–15.5)
WBC: 9.9 10*3/uL (ref 4.0–10.5)

## 2015-01-09 MED ORDER — FUROSEMIDE 40 MG PO TABS
40.0000 mg | ORAL_TABLET | Freq: Every day | ORAL | Status: DC
  Administered 2015-01-10 – 2015-01-11 (×2): 40 mg via ORAL
  Filled 2015-01-09 (×2): qty 1

## 2015-01-09 MED ORDER — FUROSEMIDE 10 MG/ML IJ SOLN
40.0000 mg | Freq: Once | INTRAMUSCULAR | Status: AC
  Administered 2015-01-09: 40 mg via INTRAVENOUS
  Filled 2015-01-09: qty 4

## 2015-01-09 MED ORDER — MAGNESIUM HYDROXIDE 400 MG/5ML PO SUSP: 30.0000 mL | Freq: Every day | ORAL | Status: DC | PRN

## 2015-01-09 MED ORDER — CLOPIDOGREL BISULFATE 75 MG PO TABS
75.0000 mg | ORAL_TABLET | Freq: Every day | ORAL | Status: DC
  Administered 2015-01-09 – 2015-01-11 (×3): 75 mg via ORAL
  Filled 2015-01-09 (×3): qty 1

## 2015-01-09 MED ORDER — INSULIN ASPART 100 UNIT/ML ~~LOC~~ SOLN: 0.0000 [IU] | Freq: Three times a day (TID) | SUBCUTANEOUS | Status: DC

## 2015-01-09 MED ORDER — INSULIN DETEMIR 100 UNIT/ML ~~LOC~~ SOLN
18.0000 [IU] | Freq: Two times a day (BID) | SUBCUTANEOUS | Status: DC
  Administered 2015-01-09 – 2015-01-10 (×2): 18 [IU] via SUBCUTANEOUS
  Filled 2015-01-09 (×3): qty 0.18

## 2015-01-09 MED ORDER — MOVING RIGHT ALONG BOOK
Freq: Once | Status: AC
  Administered 2015-01-09: 12:00:00
  Filled 2015-01-09: qty 1

## 2015-01-09 MED ORDER — GUAIFENESIN ER 600 MG PO TB12
600.0000 mg | ORAL_TABLET | Freq: Two times a day (BID) | ORAL | Status: DC
  Administered 2015-01-09 – 2015-01-11 (×4): 600 mg via ORAL
  Filled 2015-01-09 (×5): qty 1

## 2015-01-09 MED ORDER — ASPIRIN EC 81 MG PO TBEC
81.0000 mg | DELAYED_RELEASE_TABLET | Freq: Every day | ORAL | Status: DC
  Administered 2015-01-09 – 2015-01-11 (×3): 81 mg via ORAL
  Filled 2015-01-09 (×3): qty 1

## 2015-01-09 MED ORDER — INSULIN DETEMIR 100 UNIT/ML ~~LOC~~ SOLN
8.0000 [IU] | Freq: Once | SUBCUTANEOUS | Status: AC
  Administered 2015-01-09: 8 [IU] via SUBCUTANEOUS
  Filled 2015-01-09: qty 0.08

## 2015-01-09 MED ORDER — SODIUM CHLORIDE 0.9 % IJ SOLN: 3.0000 mL | INTRAMUSCULAR | Status: DC | PRN

## 2015-01-09 MED ORDER — INSULIN DETEMIR 100 UNIT/ML ~~LOC~~ SOLN: 8.0000 [IU] | Freq: Two times a day (BID) | SUBCUTANEOUS | Status: DC

## 2015-01-09 MED ORDER — SODIUM CHLORIDE 0.9 % IJ SOLN
3.0000 mL | Freq: Two times a day (BID) | INTRAMUSCULAR | Status: DC
  Administered 2015-01-09 – 2015-01-10 (×2): 3 mL via INTRAVENOUS

## 2015-01-09 MED ORDER — POTASSIUM CHLORIDE CRYS ER 20 MEQ PO TBCR
20.0000 meq | EXTENDED_RELEASE_TABLET | Freq: Every day | ORAL | Status: DC
  Administered 2015-01-10 – 2015-01-11 (×2): 20 meq via ORAL
  Filled 2015-01-09 (×2): qty 1

## 2015-01-09 MED ORDER — INSULIN ASPART 100 UNIT/ML ~~LOC~~ SOLN
0.0000 [IU] | Freq: Three times a day (TID) | SUBCUTANEOUS | Status: DC
  Administered 2015-01-09 (×2): 2 [IU] via SUBCUTANEOUS
  Administered 2015-01-09: 8 [IU] via SUBCUTANEOUS
  Administered 2015-01-10: 2 [IU] via SUBCUTANEOUS
  Administered 2015-01-10: 8 [IU] via SUBCUTANEOUS
  Administered 2015-01-10: 4 [IU] via SUBCUTANEOUS
  Administered 2015-01-11: 8 [IU] via SUBCUTANEOUS

## 2015-01-09 MED ORDER — SODIUM CHLORIDE 0.9 % IV SOLN: 250.0000 mL | INTRAVENOUS | Status: DC | PRN

## 2015-01-09 MED FILL — Sodium Chloride IV Soln 0.9%: INTRAVENOUS | Qty: 2000 | Status: AC

## 2015-01-09 MED FILL — Heparin Sodium (Porcine) Inj 1000 Unit/ML: INTRAMUSCULAR | Qty: 30 | Status: AC

## 2015-01-09 MED FILL — Magnesium Sulfate Inj 50%: INTRAMUSCULAR | Qty: 20 | Status: AC

## 2015-01-09 MED FILL — Electrolyte-R (PH 7.4) Solution: INTRAVENOUS | Qty: 3000 | Status: AC

## 2015-01-09 MED FILL — Heparin Sodium (Porcine) Inj 1000 Unit/ML: INTRAMUSCULAR | Qty: 20 | Status: AC

## 2015-01-09 MED FILL — Sodium Bicarbonate IV Soln 8.4%: INTRAVENOUS | Qty: 50 | Status: AC

## 2015-01-09 MED FILL — Lidocaine HCl IV Inj 20 MG/ML: INTRAVENOUS | Qty: 5 | Status: AC

## 2015-01-09 MED FILL — Potassium Chloride Inj 2 mEq/ML: INTRAVENOUS | Qty: 40 | Status: AC

## 2015-01-09 MED FILL — Mannitol IV Soln 20%: INTRAVENOUS | Qty: 500 | Status: AC

## 2015-01-09 NOTE — Progress Notes (Addendum)
T[facility] DAILY ICU PROGRESS NOTE                   [address]            [place] [address]          [phone]   2 Days Post-Op Procedure(s) (LRB): CORONARY ARTERY BYPASS GRAFTING (CABG) x4 using bilateral greater saphenous vein and left internal mammary artery. (N/A)  Total Length of Stay:  LOS: 4 days   Subjective: OOB in chair.  Feels well, no complaints.   Objective: Vital signs in last 24 hours: Temp:  [97.5 F (36.4 C)-99.2 F (37.3 C)] 98.2 F (36.8 C) (06/02 0413) Pulse Rate:  [72-91] 81 (06/02 0600) Cardiac Rhythm:  [-] Normal sinus rhythm (06/02 0400) Resp:  [8-23] 13 (06/02 0600) BP: (103-186)/(48-88) 120/52 mmHg (06/02 0600) SpO2:  [92 %-100 %] 94 % (06/02 0600) Weight:  [193 lb 6.4 oz (87.726 kg)] 193 lb 6.4 oz (87.726 kg) (06/02 0500)  Filed Weights   01/07/15 0500 01/08/15 0500 01/09/15 0500  Weight: 182 lb 1.6 oz (82.6 kg) 194 lb (87.998 kg) 193 lb 6.4 oz (87.726 kg)  Preop wt= 82.6 kg  Weight change: -9.6 oz (-0.272 kg)   Hemodynamic parameters for last 24 hours: PAP: (19-33)/(10-22) 20/13 mmHg  Intake/Output from previous day: 06/01 0701 - 06/02 0700 In: 1849.1 [P.O.:400; I.V.:949.1; IV Piggyback:500] Out: 2400 [Urine:2220; Chest Tube:180]  CBGs 176-146-103-101-92    Current Meds: Scheduled Meds: . acetaminophen  1,000 mg Oral 4 times per day   Or  . acetaminophen (TYLENOL) oral liquid 160 mg/5 mL  1,000 mg Per Tube 4 times per day  . antiseptic oral rinse  7 mL Mouth Rinse QID  . aspirin EC  81 mg Oral Daily  . bisacodyl  10 mg Oral Daily   Or  . bisacodyl  10 mg Rectal Daily  . chlorhexidine  15 mL Mouth Rinse BID  . Chlorhexidine Gluconate Cloth  6 each Topical Daily  . clopidogrel  75 mg Oral Daily  . docusate sodium  200 mg Oral Daily  . furosemide  40 mg Intravenous Once  . insulin aspart  0-24 Units Subcutaneous 6 times per day  . insulin aspart  4 Units Subcutaneous TID WC  . insulin detemir  10 Units  Subcutaneous BID  . metoprolol tartrate  12.5 mg Oral BID   Or  . metoprolol tartrate  12.5 mg Per Tube BID  . pantoprazole  40 mg Oral Daily  . rosuvastatin  20 mg Oral Daily  . sodium chloride  3 mL Intravenous Q12H   Continuous Infusions: . sodium chloride Stopped (01/08/15 2000)  . insulin (NOVOLIN-R) infusion Stopped (01/08/15 1400)  . lactated ringers 20 mL/hr at 01/09/15 0400  . lactated ringers Stopped (01/08/15 2000)  . phenylephrine (NEO-SYNEPHRINE) Adult infusion Stopped (01/07/15 1445)   PRN Meds:.metoprolol, morphine injection, ondansetron (ZOFRAN) IV, oxyCODONE, sodium chloride, traMADol   Physical Exam: General appearance: alert, cooperative and no distress Heart: regular rate and rhythm Lungs: clear to auscultation bilaterally Extremities: Mild LE edema Wound: Dressed and dry    Lab Results: CBC: Recent Labs  01/08/15 1650 01/08/15 1655 01/09/15 0450  WBC 10.8*  --  9.9  HGB 9.0* 9.2* 8.8*  HCT 25.6* 27.0* 25.6*  PLT 213  --  203   BMET:  Recent Labs  01/08/15 0400  01/08/15 1655 01/09/15 0450  NA 134*  --  131* 134*  K 4.2  --  4.9 4.3  CL 103  --  99* 100*  CO2 24  --   --  27  GLUCOSE 99  --  194* 92  BUN 10  --  10 7  CREATININE 0.53  < > 0.50 0.64  CALCIUM 8.2*  --   --  8.3*  < > = values in this interval not displayed.  PT/INR:  Recent Labs  01/07/15 1440  LABPROT 17.2*  INR 1.39   Radiology: Dg Chest Port 1 View  01/09/2015   CLINICAL DATA:  Coronary artery disease. Status post coronary artery bypass grafting  EXAM: PORTABLE CHEST - 1 VIEW  COMPARISON:  January 08, 2015  FINDINGS: Swan-Ganz catheter has been removed. Cordis tip is in the superior vena cava. Chest tube is present on the left. Mediastinal drain is present. No pneumothorax. There is mild bibasilar atelectasis. Lungs are otherwise clear. Heart size and pulmonary vascularity are normal. No adenopathy. No bone lesions. There is chronic acromioclavicular separation on the  right.  IMPRESSION: Tube and catheter positions as described without apparent pneumothorax. Mild bibasilar atelectasis. No consolidation.   Electronically Signed   By: Bretta BangWilliam  Woodruff III M.D.   On: 01/09/2015 07:53     Assessment/Plan: S/P Procedure(s) (LRB): CORONARY ARTERY BYPASS GRAFTING (CABG) x4 using bilateral greater saphenous vein and left internal mammary artery. (N/A)  CV- Off all gtts, BPs stable, trending up.  Maintaining SR. Continue beta blocker, consider starting low dose ACE-I if BPs continue to increase.  Pulm-  Pulm toilet/IS.  Expected postop blood loss anemia- H/H generally stable.   DM- CBGs stable, continue Levemir. Resume po meds when eating better. A1C=9.0.  Vol overload- diurese.  Continue ambulation, hopefully can d/c CTs, Foley and transfer to stepdown.  COLLINS,GINA H 01/09/2015 8:20 AM  patient examined and medical record reviewed,agree with above note. Kathlee Nationseter Van Trigt III 01/10/2015

## 2015-01-09 NOTE — Anesthesia Postprocedure Evaluation (Signed)
  Anesthesia Post-op Note  Patient: [name]  Procedure(s) Performed: Procedure(s): CORONARY ARTERY BYPASS GRAFTING (CABG) x4 using bilateral greater saphenous vein and left internal mammary artery. (N/A)  Patient Location: SICU   Anesthesia Type:General  Level of Consciousness: awake, alert , oriented and patient cooperative  Airway and Oxygen Therapy: Patient Spontanous Breathing and Patient connected to nasal cannula oxygen  Post-op Pain: none  Post-op Assessment: Post-op Vital signs reviewed, Patient's Cardiovascular Status Stable, Respiratory Function Stable, Patent Airway, No signs of Nausea or vomiting and Pain level controlled  Post-op Vital Signs: Reviewed and stable  Last Vitals:  Filed Vitals:   01/09/15 1610  BP: 127/60  Pulse:   Temp: 36.8 C  Resp: 16    Complications: No apparent anesthesia complications

## 2015-01-09 NOTE — Care Management Note (Signed)
Case Management Note  Patient Details  Name: [name] MRN: [id number] Date of Birth: 1950-10-22  Subjective/Objective:                    Action/Plan:   Expected Discharge Date:  01/10/15               Expected Discharge Plan:  Home/Self Care  In-House Referral:     Discharge planning Services  CM Consult  Post Acute Care Choice:    Choice offered to:     DME Arranged:    DME Agency:     HH Arranged:    HH Agency:     Status of Service:  In process, will continue to follow  Medicare Important Message Given:    Date Medicare IM Given:    Medicare IM give by:    Date Additional Medicare IM Given:    Additional Medicare Important Message give by:     If discussed at Long Length of Stay Meetings, dates discussed:  01/09/2015  Additional Comments:  Yvone Neurutchfield, Akeylah Hendel M, RN 01/09/2015, 8:59 AM

## 2015-01-10 ENCOUNTER — Inpatient Hospital Stay (HOSPITAL_COMMUNITY): Payer: Medicare Other

## 2015-01-10 LAB — TYPE AND SCREEN
ABO/RH(D): O POS
Antibody Screen: NEGATIVE
Unit division: 0
Unit division: 0
Unit division: 0
Unit division: 0

## 2015-01-10 LAB — CBC
HCT: 26.7 % — ABNORMAL LOW (ref 36.0–46.0)
Hemoglobin: 9 g/dL — ABNORMAL LOW (ref 12.0–15.0)
MCH: 29.2 pg (ref 26.0–34.0)
MCHC: 33.7 g/dL (ref 30.0–36.0)
MCV: 86.7 fL (ref 78.0–100.0)
Platelets: 227 10*3/uL (ref 150–400)
RBC: 3.08 MIL/uL — ABNORMAL LOW (ref 3.87–5.11)
RDW: 14.6 % (ref 11.5–15.5)
WBC: 8.7 10*3/uL (ref 4.0–10.5)

## 2015-01-10 LAB — GLUCOSE, CAPILLARY
GLUCOSE-CAPILLARY: 107 mg/dL — AB (ref 65–99)
GLUCOSE-CAPILLARY: 95 mg/dL (ref 65–99)
GLUCOSE-CAPILLARY: 98 mg/dL (ref 65–99)
GLUCOSE-CAPILLARY: 93 mg/dL (ref 65–99)
GLUCOSE-CAPILLARY: 99 mg/dL (ref 65–99)
GLUCOSE-CAPILLARY: 140 mg/dL — AB (ref 65–99)
GLUCOSE-CAPILLARY: 146 mg/dL — AB (ref 65–99)
GLUCOSE-CAPILLARY: 186 mg/dL — AB (ref 65–99)
GLUCOSE-CAPILLARY: 226 mg/dL — AB (ref 65–99)
Glucose-Capillary: 113 mg/dL — ABNORMAL HIGH (ref 65–99)
Glucose-Capillary: 97 mg/dL (ref 65–99)
Glucose-Capillary: 99 mg/dL (ref 65–99)
Glucose-Capillary: 185 mg/dL — ABNORMAL HIGH (ref 65–99)

## 2015-01-10 LAB — BASIC METABOLIC PANEL
Anion gap: 6 (ref 5–15)
BUN: 9 mg/dL (ref 6–20)
CO2: 29 mmol/L (ref 22–32)
Calcium: 8.4 mg/dL — ABNORMAL LOW (ref 8.9–10.3)
Chloride: 99 mmol/L — ABNORMAL LOW (ref 101–111)
Creatinine, Ser: 0.68 mg/dL (ref 0.44–1.00)
GFR calc Af Amer: >60 mL/min (ref >60)
GFR calc non Af Amer: >60 mL/min (ref >60)
Glucose, Bld: 167 mg/dL — ABNORMAL HIGH (ref 65–99)
Potassium: 4.2 mmol/L (ref 3.5–5.1)
Sodium: 134 mmol/L — ABNORMAL LOW (ref 135–145)

## 2015-01-10 MED ORDER — LOSARTAN POTASSIUM 25 MG PO TABS
25.0000 mg | ORAL_TABLET | Freq: Every day | ORAL | Status: DC
  Administered 2015-01-10: 25 mg via ORAL
  Filled 2015-01-10 (×2): qty 1

## 2015-01-10 MED ORDER — ZOLPIDEM TARTRATE 5 MG PO TABS
5.0000 mg | ORAL_TABLET | Freq: Every evening | ORAL | Status: DC | PRN
  Administered 2015-01-10: 5 mg via ORAL
  Filled 2015-01-10: qty 1

## 2015-01-10 MED ORDER — LINAGLIPTIN 5 MG PO TABS
5.0000 mg | ORAL_TABLET | Freq: Every day | ORAL | Status: DC
  Administered 2015-01-10 – 2015-01-11 (×2): 5 mg via ORAL
  Filled 2015-01-10 (×2): qty 1
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
## 2015-01-10 NOTE — Progress Notes (Signed)
    Subjective:  Feels good, expected chest soreness, otherwise doing very well. Eager to go home tomorrow.  Objective:  Vital Signs in the last 24 hours: Temp:  [98.2 F (36.8 C)-99.6 F (37.6 C)] 98.5 F (36.9 C) (06/03 0512) Pulse Rate:  [86-89] 86 (06/03 0512) Resp:  [12-28] 18 (06/03 0512) BP: (117-149)/(52-95) 124/95 mmHg (06/03 0512) SpO2:  [93 %-96 %] 93 % (06/03 0512) Weight:  [193 lb (87.544 kg)] 193 lb (87.544 kg) (06/03 0512)  Intake/Output from previous day: 06/02 0701 - 06/03 0700 In: 90 [I.V.:90] Out: 2495 [Urine:2485; Chest Tube:10]  Physical Exam: Pt is alert and oriented, NAD  Lab Results:  Recent Labs  01/09/15 0450 01/10/15 0545  WBC 9.9 8.7  HGB 8.8* 9.0*  PLT 203 227    Recent Labs  01/09/15 0450 01/10/15 0545  NA 134* 134*  K 4.3 4.2  CL 100* 99*  CO2 27 29  GLUCOSE 92 167*  BUN 7 9  CREATININE 0.64 0.68   No results for input(s): TROPONINI in the last 72 hours.  Invalid input(s): CK, MB  Cardiac Studies: 2-D echocardiogram from 01/06/2015 reviewed with normal LV systolic function  Tele: Personally reviewed: Sinus rhythm  Assessment/Plan:  64 year old diabetic woman who presented with unstable angina found to have multivessel coronary artery disease and treated with CABG. She seems to be making excellent progress and is approaching discharge from the hospital. I have reviewed her medical program which includes dual antiplatelets therapy, beta blocker, and a statin drug. In the setting of her diabetes and CAD, I will add losartan 25 mg daily. I have reviewed her blood pressures and she should tolerate this well. She has follow-up already scheduled with Dr. Mariah MillingGollan in early July.   Tonny Bollmanooper, Olanda Downie, M.D. 01/10/2015, 9:45 AM

## 2015-01-10 NOTE — Progress Notes (Signed)
01/10/2015 10:31 PM The patient wanted to get Ambien 10 mg for sleep since she takes it at home.  Dr. Cornelius Moraswen was paged and he said to order her Ambien 5 mg nightly as needed for sleep.  Will continue to monitor the patient.  Harriet Massonavidson, Chanette Demo E, RN

## 2015-01-10 NOTE — Progress Notes (Addendum)
       [address]       [place] [id number]             [phone]          3 Days Post-Op Procedure(s) (LRB): CORONARY ARTERY BYPASS GRAFTING (CABG) x4 using bilateral greater saphenous vein and left internal mammary artery. (N/A)  Subjective: OOB in chair.  Sore, but otherwise feels well.  Objective: Vital signs in last 24 hours: Patient Vitals for the past 24 hrs:  BP Temp Temp src Pulse Resp SpO2 Weight  01/10/15 0512 (!) 124/95 mmHg 98.5 F (36.9 C) Oral 86 18 93 % 193 lb (87.544 kg)  01/09/15 2106 (!) 149/64 mmHg 99.3 F (37.4 C) Oral 89 18 95 % -  01/09/15 1610 127/60 mmHg 98.2 F (36.8 C) Oral - 16 - -  01/09/15 1500 (!) 120/52 mmHg - - - 12 94 % -  01/09/15 1400 (!) 117/52 mmHg - - - (!) 22 - -  01/09/15 1300 124/66 mmHg - - - (!) 28 - -  01/09/15 1200 (!) 126/53 mmHg - - - 12 93 % -   Current Weight  01/10/15 193 lb (87.544 kg)  Preop wt= 82.6 kg   Intake/Output from previous day: 06/02 0701 - 06/03 0700 In: 90 [I.V.:90] Out: 2495 [Urine:2485; Chest Tube:10]  CBGs [phone]   PHYSICAL EXAM:  Heart: RRR Lungs: Clear Wound: Clean and dry Extremities: Mild LE edema    Lab Results: CBC: Recent Labs  01/09/15 0450 01/10/15 0545  WBC 9.9 8.7  HGB 8.8* 9.0*  HCT 25.6* 26.7*  PLT 203 227   BMET:  Recent Labs  01/09/15 0450 01/10/15 0545  NA 134* 134*  K 4.3 4.2  CL 100* 99*  CO2 27 29  GLUCOSE 92 167*  BUN 7 9  CREATININE 0.64 0.68  CALCIUM 8.3* 8.4*    PT/INR:  Recent Labs  01/07/15 1440  LABPROT 17.2*  INR 1.39      Assessment/Plan: S/P Procedure(s) (LRB): CORONARY ARTERY BYPASS GRAFTING (CABG) x4 using bilateral greater saphenous vein and left internal mammary artery. (N/A)  CV- BPs trending up. Maintaining SR. Continue beta blocker, ARB started by cardiology. Continue ASA/Plavix.  Pulm- Pulm toilet/IS.  Expected postop blood loss anemia- H/H generally stable.   DM- CBGs stable.  Will d/c  Levemir and resume po meds. A1C=9.0.  Vol overload- diurese.  Continue ambulation/CRPI.  Hopefully home in next 1-2 days if she continues to progress.   LOS: 5 days    COLLINS,GINA H 01/10/2015  Agree with above assessment and plan Home on ASA 81 and resume long term plavix for PAD, femoral stent  patient examined and medical record reviewed,agree with above note. Kathlee Nationseter Van Trigt III 01/10/2015

## 2015-01-10 NOTE — Discharge Summary (Signed)
[address]       [place] [id number]             [phone]              Discharge Summary  Name: [name] DOB: 26-Nov-1950 64 y.o. MRN: [id number]   Admission Date: 01/05/2015 Discharge Date:     Admitting Diagnosis: Principal Problem:   Unstable angina Active Problems:   CAD (coronary artery disease), with need for CABG, cath 01/03/15     Type 2 diabetes mellitus   PAF (paroxysmal atrial fibrillation)   PAD (peripheral artery disease)   Hyperlipidemia   Hypertension   Traumatic ecchymosis of right thigh, post cath   S/P CABG x 4    Discharge Diagnosis:  Principal Problem:   Unstable angina Active Problems:   CAD (coronary artery disease), with need for CABG, cath 01/03/15     Type 2 diabetes mellitus   PAF (paroxysmal atrial fibrillation)   PAD (peripheral artery disease)   Hyperlipidemia   Hypertension   Traumatic ecchymosis of right thigh, post cath   S/P CABG x 4 Expected postoperative blood loss anemia    Procedures: CORONARY ARTERY BYPASS GRAFTING  X 4  - 01/07/2015  Left internal mammary artery to left anterior descending  Saphenous vein graft to ramus intermediate  Saphenous vein graft to obtuse marginal 1  Saphenous vein graft to posterior descending  Endoscopic greater saphenous vein harvest bilateral legs    HPI:  The patient is a 64 y.o. female with a know history of coronary artery disease, status post prior PCI to the RCA in 2006.  She recently was seen by Dr. Mariah MillingGollan after developing acute onset left sided chest pain with exertion, associated with diaphoresis and resolved with rest. She underwent cardiac catheterization on 01/03/2015 and was found to have 70% distal LAD, 75% proximal LAD, 80% ostial circumflex, 80% distal circumflex, 80% proximal RCA and 100% mid RCA stenoses. She was scheduled for outpatient cardiac surgical consultation with Dr. Donata ClayVan Trigt, but prior to her appointment, she developed  ecchymosis and tenderness of her right thigh at her cath site.  She [facility] for evaluation and also noted additional episodes of chest pain.  She was seen by cardiology and was subsequently admitted for further treatment.    Hospital Course:  The patient was [facility]. Plavix was discontinued in light of her thigh hematoma and she was started on heparin. Cardiac surgery was consulted for consideration of surgical revascularization.  Dr. Donata ClayVan Trigt saw the patient and felt that she would benefit from CABG. All risks, benefits and alternatives of surgery were explained in detail, and the patient agreed to proceed. The patient was taken to the operating room and underwent the above procedure.    The postoperative course has generally been uneventful.  She has remained in sinus rhythm, and has been started on a beta blocker and ARB.  She also has been restarted on aspirin and Plavix.  She is ambulating in the halls and is tolerating a regular diet.  She has had a mild volume overload and has been started on Lasix, to which she is responding well.  Blood sugars have been controlled and her home medications have been resumed. (A1C=9.0)  Incisions are healing well.  Overall, the patient is progressing well and is medically stable for discharge on today's date.    Recent vital signs:  Filed  Vitals:   01/10/15 0512  BP: 124/95  Pulse: 86  Temp: 98.5 F (36.9 C)  Resp: 18    Recent laboratory studies:  CBC: Recent Labs  01/09/15 0450 01/10/15 0545  WBC 9.9 8.7  HGB 8.8* 9.0*  HCT 25.6* 26.7*  PLT 203 227   BMET:  Recent Labs  01/09/15 0450 01/10/15 0545  NA 134* 134*  K 4.3 4.2  CL 100* 99*  CO2 27 29  GLUCOSE 92 167*  BUN 7 9  CREATININE 0.64 0.68  CALCIUM 8.3* 8.4*    PT/INR:  Recent Labs  01/07/15 1440  LABPROT 17.2*  INR 1.39      Medication List    STOP taking these medications        aspirin 325 MG tablet    Replaced by:  aspirin 81 MG EC tablet     atenolol 100 MG tablet  Commonly known as:  TENORMIN     furosemide 20 MG tablet  Commonly known as:  LASIX     isosorbide mononitrate 60 MG 24 hr tablet  Commonly known as:  IMDUR     nitroGLYCERIN 0.4 MG SL tablet  Commonly known as:  NITROSTAT      TAKE these medications        acetaminophen 500 MG tablet  Commonly known as:  TYLENOL  Take 1,000 mg by mouth every 6 (six) hours as needed for moderate pain.     AMBIEN 10 MG tablet  Generic drug:  zolpidem  Take 10 mg by mouth at bedtime as needed.     aspirin 81 MG EC tablet  Take 1 tablet (81 mg total) by mouth daily.     clopidogrel 75 MG tablet  Commonly known as:  PLAVIX  Take 75 mg by mouth daily.     guaiFENesin 600 MG 12 hr tablet  Commonly known as:  MUCINEX  Take 600 mg by mouth daily. Takes 1 tab daily, can take addt'l tab as needed for mucus     losartan 25 MG tablet  Commonly known as:  COZAAR  Take 1 tablet (25 mg total) by mouth at bedtime.     metoprolol tartrate 25 MG tablet  Commonly known as:  LOPRESSOR  Take 0.5 tablets (12.5 mg total) by mouth 2 (two) times daily.     oxyCODONE 5 MG immediate release tablet  Commonly known as:  Oxy IR/ROXICODONE  Take 1-2 tablets (5-10 mg total) by mouth every 4 (four) hours as needed for severe pain.     rosuvastatin 20 MG tablet  Commonly known as:  CRESTOR  Take 1 tablet (20 mg total) by mouth daily.     sitaGLIPtin 100 MG tablet  Commonly known as:  JANUVIA  Take 100 mg by mouth daily.         Discharge Instructions:  The patient is to refrain from driving, heavy lifting or strenuous activity.  May shower daily and clean incisions with soap and water.  May resume regular diet.   Follow Up: Follow-up Information    Follow up with Julien Nordmann, MD On 02/07/2015.   Specialty:  Cardiology   Why:  Appointment is at 2:40 pm   Contact information:   [address] [place]  [address] [phone]       Follow up with Kerin Perna III, MD On 02/12/2015.   Specialty:  Cardiothoracic Surgery   Why:  Have a chest x-ray at [facility] at 12:00, then see MD  at 1:00   Contact information:   [name] [employer] [address] [place] [address] [phone]           Discharge Instructions    Amb Referral to Cardiac Rehabilitation    Complete by:  As directed   Congestive Heart Failure: If diagnosis is Heart Failure, patient MUST meet each of the CMS criteria: 1. Left Ventricular Ejection Fraction </= 35% 2. NYHA class II-IV symptoms despite being on optimal heart failure therapy for at least 6 weeks. 3. Stable = have not had a recent (<6 weeks) or planned (<6 months) major cardiovascular hospitalization or procedure  Program Details: - Physician supervised classes - 1-3 classes per week over a 12-18 week period, generally for a total of 36 sessions  Physician Certification: I certify that the above Cardiac Rehabilitation treatment is medically necessary and is medically approved by me for treatment of this patient. The patient is willing and cooperative, able to ambulate and medically stable to participate in exercise rehabilitation. The participant's progress and Individualized Treatment Plan will be reviewed by the Medical Director, Cardiac Rehab staff and as indicated by the Referring/Ordering Physician.  Diagnosis:  CABG          The patient has been discharged on:  1.Beta Blocker: Yes [ x ]  No   If No, reason:    2.Ace Inhibitor/ARB: Yes   No [ x ]  If No, reason:    3.Statin: Yes [ x ]  No   If No, reason:    4.Ecasa: Yes [ x ]  No   If No, reason:   COLLINS,GINA H 01/10/2015, 12:30 PM

## 2015-01-11 LAB — GLUCOSE, CAPILLARY
GLUCOSE-CAPILLARY: 104 mg/dL — AB (ref 65–99)
Glucose-Capillary: 111 mg/dL — ABNORMAL HIGH (ref 65–99)
Glucose-Capillary: 213 mg/dL — ABNORMAL HIGH (ref 65–99)

## 2015-01-11 MED ORDER — OXYCODONE HCL 5 MG PO TABS: 5.0000 mg | ORAL_TABLET | ORAL | Status: DC | PRN

## 2015-01-11 MED ORDER — LOSARTAN POTASSIUM 25 MG PO TABS: 25.0000 mg | ORAL_TABLET | Freq: Every day | ORAL | Status: DC

## 2015-01-11 MED ORDER — ASPIRIN 81 MG PO TBEC: 81.0000 mg | DELAYED_RELEASE_TABLET | Freq: Every day | ORAL | Status: DC

## 2015-01-11 MED ORDER — METOPROLOL TARTRATE 25 MG PO TABS: 12.5000 mg | ORAL_TABLET | Freq: Two times a day (BID) | ORAL | Status: DC

## 2015-01-11 NOTE — Discharge Instructions (Signed)
Endoscopic Saphenous Vein Harvesting °Care After °Refer to this sheet in the next few weeks. These instructions provide you with information on caring for yourself after your procedure. Your health care provider may also give you more specific instructions. Your treatment has been planned according to current medical practices, but problems sometimes occur. Call your health care provider if you have any problems or questions after your procedure. °HOME CARE INSTRUCTIONS °Medicine °· Take whatever pain medicine your surgeon prescribes. Follow the directions carefully. Do not take over-the-counter pain medicine unless your surgeon says it is okay. Some pain medicine can cause bleeding problems for several weeks after surgery. °· Follow your surgeon's instructions about driving. You will probably not be permitted to drive after heart surgery. °· Take any medicines your surgeon prescribes. Any medicines you took before your heart surgery should be checked with your health care provider before you start taking them again. °Wound care °· If your surgeon has prescribed an elastic bandage or stocking, ask how long you should wear it. °· Check the area around your surgical cuts (incisions) whenever your bandages (dressings) are changed. Look for any redness or swelling. °· You will need to return to have the stitches (sutures) or staples taken out. Ask your surgeon when to do that. °· Ask your surgeon when you can shower or bathe. °Activity °· Try to keep your legs raised when you are sitting. °· Do any exercises your health care providers have given you. These may include deep breathing exercises, coughing, walking, or other exercises. °SEEK MEDICAL CARE IF: °· You have any questions about your medicines. °· You have more leg pain, especially if your pain medicine stops working. °· New or growing bruises develop on your leg. °· Your leg swells, feels tight, or becomes red. °· You have numbness in your leg. °SEEK IMMEDIATE  MEDICAL CARE IF: °· Your pain gets much worse. °· Blood or fluid leaks from any of the incisions. °· Your incisions become warm, swollen, or red. °· You have chest pain. °· You have trouble breathing. °· You have a fever. °· You have more pain near your leg incision. °MAKE SURE YOU: °· Understand these instructions. °· Will watch your condition. °· Will get help right away if you are not doing well or get worse. °Document Released: 04/07/2011 Document Revised: 07/31/2013 Document Reviewed: 04/07/2011 °ExitCare® Patient Information ©2015 [employer]. This information is not intended to replace advice given to you by your health care provider. Make sure you discuss any questions you have with your health care provider. °Coronary Artery Bypass Grafting, Care After °These instructions give you information on caring for yourself after your procedure. Your doctor may also give you more specific instructions. Call your doctor if you have any problems or questions after your procedure.  °HOME CARE °· Only take medicine as told by your doctor. Take medicines exactly as told. Do not stop taking medicines or start any new medicines without talking to your doctor first. °· Take your pulse as told by your doctor. °· Do deep breathing as told by your doctor. Use your breathing device (incentive spirometer), if given, to practice deep breathing several times a day. Support your chest with a pillow or your arms when you take deep breaths or cough. °· Keep the area clean, dry, and protected where the surgery cuts (incisions) were made. Remove bandages (dressings) only as told by your doctor. If strips were applied to surgical area, do not take them off. They fall off   on their own. °· Check the surgery area daily for puffiness (swelling), redness, or leaking fluid. °· If surgery cuts were made in your legs: °· Avoid crossing your legs. °· Avoid sitting for long periods of time. Change positions every 30 minutes. °· Raise your legs  when you are sitting. Place them on pillows. °· Wear stockings that help keep blood clots from forming in your legs (compression stockings). °· Only take sponge baths until your doctor says it is okay to take showers. Pat the surgery area dry. Do not rub the surgery area with a washcloth or towel. Do not bathe, swim, or use a hot tub until your doctor says it is okay. °· Eat foods that are high in fiber. These include raw fruits and vegetables, whole grains, beans, and nuts. Choose lean meats. Avoid canned, processed, and fried foods. °· Drink enough fluids to keep your pee (urine) clear or pale yellow. °· Weigh yourself every day. °· Rest and limit activity as told by your doctor. You may be told to: °· Stop any activity if you have chest pain, shortness of breath, changes in heartbeat, or dizziness. Get help right away if this happens. °· Move around often for short amounts of time or take short walks as told by your doctor. Gradually become more active. You may need help to strengthen your muscles and build endurance. °· Avoid lifting, pushing, or pulling anything heavier than 10 pounds (4.5 kg) for at least 6 weeks after surgery. °· Do not drive until your doctor says it is okay. °· Ask your doctor when you can go back to work. °· Ask your doctor when you can begin sexual activity again. °· Follow up with your doctor as told. °GET HELP IF: °· You have puffiness, redness, more pain, or fluid draining from the incision site. °· You have a fever. °· You have puffiness in your ankles or legs. °· You have pain in your legs. °· You gain 2 or more pounds (0.9 kg) a day. °· You feel sick to your stomach (nauseous) or throw up (vomit). °· You have watery poop (diarrhea). °GET HELP RIGHT AWAY IF: °· You have chest pain that goes to your jaw or arms. °· You have shortness of breath. °· You have a fast or irregular heartbeat. °· You notice a "clicking" in your breastbone when you move. °· You have numbness or weakness in  your arms or legs. °· You feel dizzy or light-headed. °MAKE SURE YOU: °· Understand these instructions. °· Will watch your condition. °· Will get help right away if you are not doing well or get worse. °Document Released: 07/31/2013 Document Reviewed: 07/31/2013 °ExitCare® Patient Information ©2015 [employer]. This information is not intended to replace advice given to you by your health care provider. Make sure you discuss any questions you have with your health care provider. ° °

## 2015-01-11 NOTE — Progress Notes (Signed)
Pt now requesting Dulcolax supp at this time prior to d/c home; pt states she was up in BR trying to have BM, but she was having difficulty; she said without straining she did not feel she would be able to have a BM; pt encouraged to lie down in bed after placing supp; will cont. To monitor.

## 2015-01-11 NOTE — Progress Notes (Signed)
Husband at bedside; pt and husband given d/c instructions; both verbalized understanding; per protocol pt must stay until 1230 which is 3 hours post EPW removal; pt up OOB bed in room; no complaints of pain; pt given prescriptions; pt to d/c home with husband; will cont. To monitor.

## 2015-01-11 NOTE — Progress Notes (Signed)
EPW d/c at this time per protocal; pt tolerated well; chest tube sutures d/c at this time also; steri strips applied to site; bedrest until 1035; will cont. To monitor.

## 2015-01-11 NOTE — Progress Notes (Signed)
IV's and tele monitor d/c at this time; pt remained SR after EPW d/c; pt to d/c home with husband; will cont. To monitor.

## 2015-01-11 NOTE — Progress Notes (Addendum)
[address]       [place] [id number]             [phone]      4 Days Post-Op Procedure(s) (LRB): CORONARY ARTERY BYPASS GRAFTING (CABG) x4 using bilateral greater saphenous vein and left internal mammary artery. (N/A) Subjective: Feels ok, no new issues  Objective: Vital signs in last 24 hours: Temp:  [97.7 F (36.5 C)-98.5 F (36.9 C)] 97.9 F (36.6 C) (06/04 0539) Pulse Rate:  [80-88] 80 (06/04 0539) Cardiac Rhythm:  [-] Normal sinus rhythm (06/03 2050) Resp:  [17-18] 17 (06/04 0539) BP: (121-139)/(57-89) 125/58 mmHg (06/04 0539) SpO2:  [96 %-98 %] 96 % (06/04 0539) Weight:  [187 lb 6.3 oz (85 kg)] 187 lb 6.3 oz (85 kg) (06/04 0539)  Hemodynamic parameters for last 24 hours:    Intake/Output from previous day: 06/03 0701 - 06/04 0700 In: 240 [P.O.:240] Out: 600 [Urine:600] Intake/Output this shift: Total I/O In: 240 [P.O.:240] Out: -   General appearance: alert, cooperative and no distress Heart: regular rate and rhythm Lungs: clear to auscultation bilaterally Abdomen: benign Extremities: minor edema Wound: incis healing well  Lab Results:  Recent Labs  01/09/15 0450 01/10/15 0545  WBC 9.9 8.7  HGB 8.8* 9.0*  HCT 25.6* 26.7*  PLT 203 227   BMET:  Recent Labs  01/09/15 0450 01/10/15 0545  NA 134* 134*  K 4.3 4.2  CL 100* 99*  CO2 27 29  GLUCOSE 92 167*  BUN 7 9  CREATININE 0.64 0.68  CALCIUM 8.3* 8.4*    PT/INR: No results for input(s): LABPROT, INR in the last 72 hours. ABG    Component Value Date/Time   PHART 7.288* 01/07/2015 1957   HCO3 22.1 01/07/2015 1957   TCO2 21 01/08/2015 1655   ACIDBASEDEF 4.0* 01/07/2015 1957   O2SAT 99.0 01/07/2015 1957   CBG (last 3)   Recent Labs  01/10/15 2055 01/11/15 0551 01/11/15 0640  GLUCAP 226* 104* 111*    Meds Scheduled Meds: . acetaminophen  1,000 mg Oral 4 times per day   Or  . acetaminophen (TYLENOL) oral liquid 160 mg/5 mL  1,000 mg Per Tube 4 times  per day  . antiseptic oral rinse  7 mL Mouth Rinse QID  . aspirin EC  81 mg Oral Daily  . bisacodyl  10 mg Oral Daily   Or  . bisacodyl  10 mg Rectal Daily  . chlorhexidine  15 mL Mouth Rinse BID  . Chlorhexidine Gluconate Cloth  6 each Topical Daily  . clopidogrel  75 mg Oral Daily  . docusate sodium  200 mg Oral Daily  . furosemide  40 mg Oral Daily  . guaiFENesin  600 mg Oral BID  . insulin aspart  0-24 Units Subcutaneous TID AC & HS  . linagliptin  5 mg Oral Daily  . losartan  25 mg Oral QHS  . metoprolol tartrate  12.5 mg Oral BID   Or  . metoprolol tartrate  12.5 mg Per Tube BID  . pantoprazole  40 mg Oral Daily  . potassium chloride  20 mEq Oral Daily  . rosuvastatin  20 mg Oral Daily   Continuous Infusions: . lactated ringers Stopped (01/09/15 1613)   PRN Meds:.magnesium hydroxide, metoprolol, morphine injection, ondansetron (ZOFRAN) IV, oxyCODONE, traMADol, zolpidem  Xrays Dg Chest 2 View  01/10/2015   CLINICAL DATA:  Subsequent encounter for recent CABG.  EXAM: CHEST  2 VIEW  COMPARISON:  01/09/2015.  FINDINGS: Right IJ sheath has been removed in the interval. Left chest tube has been removed. The midline mediastinal/ pericardial drain is no longer evident. Bibasilar atelectasis, left greater than right noted. Tiny bilateral pleural effusions are evident. Small flame shaped opacity in the left mid lung may be related to prior chest tube placement. The cardio pericardial silhouette is enlarged. Telemetry leads overlie the chest. Bones are diffusely demineralized.  IMPRESSION: Interval removal of right IJ sheath, mediastinal/ pericardial drain, and left chest tube.  No evidence for pneumothorax.  Bibasilar atelectasis with tiny bilateral pleural effusions.   Electronically Signed   By: Kennith Center M.D.   On: 01/10/2015 07:17    Assessment/Plan: S/P Procedure(s) (LRB): CORONARY ARTERY BYPASS GRAFTING (CABG) x4 using bilateral greater saphenous vein and left internal mammary  artery. (N/A) d/c pacing wires Plan for discharge: see discharge orders   LOS: 6 days    GOLD,WAYNE E 01/11/2015  I have seen and examined the patient and agree with the assessment and plan as outlined.  Purcell Nails 01/11/2015 12:45 PM

## 2015-01-22 ENCOUNTER — Other Ambulatory Visit: Payer: Self-pay | Admitting: Internal Medicine

## 2015-01-22 ENCOUNTER — Ambulatory Visit
Admission: RE | Admit: 2015-01-22 | Discharge: 2015-01-22 | Disposition: A | Discharge status: Home / Self Care | Payer: Medicare Other | Source: Ambulatory Visit | Attending: Internal Medicine | Admitting: Internal Medicine

## 2015-01-22 ENCOUNTER — Telehealth: Payer: Self-pay | Admitting: *Deleted

## 2015-01-22 DIAGNOSIS — M79604 Pain in right leg: Secondary | ICD-10-CM

## 2015-01-22 DIAGNOSIS — R0602 Shortness of breath: Secondary | ICD-10-CM

## 2015-01-22 DIAGNOSIS — J90 Pleural effusion, not elsewhere classified: Secondary | ICD-10-CM | POA: Diagnosis not present

## 2015-01-22 DIAGNOSIS — I313 Pericardial effusion (noninflammatory): Secondary | ICD-10-CM | POA: Diagnosis not present

## 2015-01-22 DIAGNOSIS — R079 Chest pain, unspecified: Secondary | ICD-10-CM | POA: Diagnosis present

## 2015-01-22 DIAGNOSIS — I2699 Other pulmonary embolism without acute cor pulmonale: Secondary | ICD-10-CM

## 2015-01-22 DIAGNOSIS — Z951 Presence of aortocoronary bypass graft: Secondary | ICD-10-CM | POA: Insufficient documentation

## 2015-01-22 MED ORDER — IOHEXOL 350 MG/ML SOLN
75.0000 mL | Freq: Once | INTRAVENOUS | Status: AC | PRN
  Administered 2015-01-22: 75 mL via INTRAVENOUS

## 2015-01-22 NOTE — Telephone Encounter (Signed)
Dr. Mariah Milling received call from Dr. Judithann Sheen this am. Per Dr. Mariah Milling, Dr. Morton Peters is out of the office today, he left message to have him contact him tomorrow.  Advised pt that the doctors are communicating w/ each other and to continue w/ Dr. Judithann Sheen recommendation.  She is sched for b/l u/s tomorrow, will await results.  Sched pt to see Dr. Mariah Milling 01/24/15 @ 8:30. She is appreciative of the call.

## 2015-01-23 ENCOUNTER — Ambulatory Visit
Admission: RE | Admit: 2015-01-23 | Discharge: 2015-01-23 | Disposition: A | Discharge status: Home / Self Care | Payer: Medicare Other | Source: Ambulatory Visit | Attending: Internal Medicine | Admitting: Internal Medicine

## 2015-01-23 ENCOUNTER — Telehealth: Payer: Self-pay | Admitting: *Deleted

## 2015-01-23 DIAGNOSIS — R0602 Shortness of breath: Secondary | ICD-10-CM | POA: Diagnosis present

## 2015-01-23 DIAGNOSIS — R0781 Pleurodynia: Secondary | ICD-10-CM | POA: Insufficient documentation

## 2015-01-23 DIAGNOSIS — M79604 Pain in right leg: Secondary | ICD-10-CM

## 2015-01-23 DIAGNOSIS — I2699 Other pulmonary embolism without acute cor pulmonale: Secondary | ICD-10-CM

## 2015-01-23 MED ORDER — WARFARIN SODIUM 5 MG PO TABS: 5.0000 mg | ORAL_TABLET | Freq: Every day | ORAL | Status: DC

## 2015-01-23 NOTE — Telephone Encounter (Signed)
Received call from Darl Pikes in Dr. Zenaida Niece Tright's office.  She states that Dr. Morton Peters advised that : -lasix is appropriate  -recommends repeat chest x-ray at [facility] tomorrow -does not feel that pt needs thoracentesis -pt sched for venous u/s today  -if negative, start coumadin  -if positive, admit & treat or send to ED Per Dr. Mariah Milling, have pt start coumadin 5 mg daily and make sure that pt keeps appt tomorrow am  Spoke w/pt and advised her of Dr. recommendations.  She verbalizes understanding, though she does express concern over which office to call regarding results.  Advised her that the ordering doc will provide results. She states that Dr. Judithann Sheen ordered u/s so she will await results from his office.  Pt will p/u coumadin rx and start today.

## 2015-01-23 NOTE — Telephone Encounter (Signed)
Pt is calling stating she is still not feeling well, having pains across her chest and back.  Spoke to nurse and we are going to call Dr Donata Clay about this  She is aware and is just concern that this pain is not something else.  She asked well then why is she seeing [place] if we can't help in this area.  She said Dr Judithann Sheen office told her we are not in control over this whole thing.  For they did find a blood clot, stated to her I would let the nurse know but we will see more of this when she comes tomorrow  For she is doing one more ultra sound today at 3 pm She was okay with this and said she will see [place] tomorrow.  She says she is not getting any better and at this point she should be getting better.

## 2015-01-23 NOTE — Telephone Encounter (Signed)
Per Dr. Mariah Milling, it will take a few days for a coumadin to therapeutic, continue Plavix & asa until discussion at tomorrow's ov.

## 2015-01-24 ENCOUNTER — Other Ambulatory Visit
Admission: RE | Admit: 2015-01-24 | Discharge: 2015-01-24 | Disposition: A | Discharge status: Home / Self Care | Payer: Medicare Other | Source: Ambulatory Visit | Attending: Cardiovascular Disease | Admitting: Cardiovascular Disease

## 2015-01-24 ENCOUNTER — Ambulatory Visit (INDEPENDENT_AMBULATORY_CARE_PROVIDER_SITE_OTHER): Payer: Medicare Other | Admitting: Cardiovascular Disease

## 2015-01-24 ENCOUNTER — Encounter: Payer: Self-pay | Admitting: Cardiovascular Disease

## 2015-01-24 VITALS — BP 120/62 | HR 70 | Ht 68.0 in | Wt 178.0 lb

## 2015-01-24 DIAGNOSIS — I25810 Atherosclerosis of coronary artery bypass graft(s) without angina pectoris: Secondary | ICD-10-CM

## 2015-01-24 DIAGNOSIS — R0602 Shortness of breath: Secondary | ICD-10-CM | POA: Diagnosis not present

## 2015-01-24 DIAGNOSIS — R079 Chest pain, unspecified: Secondary | ICD-10-CM | POA: Insufficient documentation

## 2015-01-24 DIAGNOSIS — E785 Hyperlipidemia, unspecified: Secondary | ICD-10-CM

## 2015-01-24 DIAGNOSIS — I10 Essential (primary) hypertension: Secondary | ICD-10-CM

## 2015-01-24 DIAGNOSIS — I5033 Acute on chronic diastolic (congestive) heart failure: Secondary | ICD-10-CM

## 2015-01-24 DIAGNOSIS — I209 Angina pectoris, unspecified: Secondary | ICD-10-CM | POA: Diagnosis not present

## 2015-01-24 DIAGNOSIS — I2699 Other pulmonary embolism without acute cor pulmonale: Secondary | ICD-10-CM

## 2015-01-24 HISTORY — DX: Acute on chronic diastolic (congestive) heart failure: I50.33

## 2015-01-24 LAB — BASIC METABOLIC PANEL
Anion gap: 9 (ref 5–15)
BUN: 22 mg/dL — ABNORMAL HIGH (ref 6–20)
CHLORIDE: 102 mmol/L (ref 101–111)
CO2: 27 mmol/L (ref 22–32)
Calcium: 9.2 mg/dL (ref 8.9–10.3)
Creatinine, Ser: 0.73 mg/dL (ref 0.44–1.00)
GFR calc non Af Amer: >60 mL/min (ref >60)
Glucose, Bld: 239 mg/dL — ABNORMAL HIGH (ref 65–99)
POTASSIUM: 5 mmol/L (ref 3.5–5.1)
Sodium: 138 mmol/L (ref 135–145)

## 2015-01-24 MED ORDER — POTASSIUM CHLORIDE ER 10 MEQ PO TBCR: 10.0000 meq | EXTENDED_RELEASE_TABLET | Freq: Two times a day (BID) | ORAL | Status: DC | PRN

## 2015-01-24 NOTE — Assessment & Plan Note (Signed)
Blood pressure is well controlled on today's visit. No changes made to the medications. 
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
## 2015-01-24 NOTE — Assessment & Plan Note (Signed)
Continue on crestor 20 mg daily Goal LDL <70

## 2015-01-24 NOTE — Assessment & Plan Note (Signed)
On warfarin, INR check next week

## 2015-01-24 NOTE — Assessment & Plan Note (Signed)
Recommended she take Lasix 40 mg twice a day today, then 40 mg daily BMP done today showing potassium of 5, slight climb in her BUN There is some significant improvement in her symptoms in the past 2 days. She does not want repeat chest x-ray today She has follow-up with CT surgery in several weeks' time Recommend she call us on Monday to detail her symptoms

## 2015-01-24 NOTE — Assessment & Plan Note (Signed)
Currently with no symptoms of angina. No further workup at this time. Continue current medication regimen. 

## 2015-01-24 NOTE — Patient Instructions (Addendum)
You are doing well.  Please continue lasix twice a day with potassium twice a day  down to one a day with potassium on Monday  Cardiac rehab at 4 weeks  BMP today in the hospital  Coumadin clinic next week (new visit) for PE  Please call us if you have new issues that need to be addressed before your next appt.  Your physician wants you to follow-up in: 6 months.  You will receive a reminder letter in the mail two months in advance. If you don't receive a letter, please call our office to schedule the follow-up appointment.

## 2015-01-28 ENCOUNTER — Ambulatory Visit (INDEPENDENT_AMBULATORY_CARE_PROVIDER_SITE_OTHER): Payer: Medicare Other | Admitting: Cardiovascular Disease

## 2015-01-28 ENCOUNTER — Telehealth: Payer: Self-pay

## 2015-01-28 NOTE — Telephone Encounter (Signed)
Spoke w/ pt.  Advised her that she missed her coumadin check yesterday and that we need to check this today if possible.  She states that she got her days mixed up and thought her appt was sched for tomorrow.  Pt does not drive, she will have her husband bring her today when he gets off work at 4:00.

## 2015-01-28 NOTE — Patient Instructions (Signed)
Patient has been taking warfarin 5 mg daily over the past 5 days INR 1.4 Recommended she continue on 5 mg daily, establish new patient visit with anticoagulation clinic on 02/05/2015 in [place] Reason for warfarin is new PE seen on CT scan, nonocclusive

## 2015-01-30 ENCOUNTER — Encounter: Payer: Medicare Other | Attending: Internal Medicine | Admitting: *Deleted

## 2015-01-30 ENCOUNTER — Encounter: Payer: Self-pay | Admitting: *Deleted

## 2015-01-30 DIAGNOSIS — Z951 Presence of aortocoronary bypass graft: Secondary | ICD-10-CM

## 2015-01-30 NOTE — Progress Notes (Signed)
[name] came for her Orientation but unable to do 6 minute walk due to her Shortness of Breath plus when I went to check with Dr. Windell Hummingbird office was told his note states no Cardiac Rehab exercising for 4 weeks from June 17th. Javeria Lenis was hooked to telemetry monitor after Cardiac Rehab consent was signed .  which revealed Normal sinus rhythm and blood pressure was stable at 124/70 Pulse ox sitting down resting on room air was 95% with pulse of 65. Paperwork was just done today.

## 2015-02-03 ENCOUNTER — Other Ambulatory Visit: Payer: Self-pay

## 2015-02-05 ENCOUNTER — Ambulatory Visit (INDEPENDENT_AMBULATORY_CARE_PROVIDER_SITE_OTHER): Payer: Medicare Other

## 2015-02-05 ENCOUNTER — Telehealth: Payer: Self-pay | Admitting: Cardiovascular Disease

## 2015-02-05 DIAGNOSIS — I2699 Other pulmonary embolism without acute cor pulmonale: Secondary | ICD-10-CM

## 2015-02-05 DIAGNOSIS — Z5181 Encounter for therapeutic drug level monitoring: Secondary | ICD-10-CM | POA: Diagnosis not present

## 2015-02-05 DIAGNOSIS — I480 Paroxysmal atrial fibrillation: Secondary | ICD-10-CM | POA: Diagnosis not present

## 2015-02-05 LAB — POCT INR: INR: 2.1

## 2015-02-05 NOTE — Patient Instructions (Signed)

## 2015-02-05 NOTE — Telephone Encounter (Signed)
Pt c/o Shortness Of Breath: STAT if SOB developed within the last 24 hours or pt is noticeably SOB on the phone  1. Are you currently SOB (can you hear that pt is SOB on the phone)? Yes, patient walked from Parking Lot and is winded  2. How long have you been experiencing SOB? This is an ongoing issue per patient and Dr. Mariah MillingGollan is aware as is PCP Sparks whom she will se tomorrow  3. Are you SOB when sitting or when up moving around? constant  4. Are you currently experiencing any other symptoms? Legs are hurting      Patient in clinic for coumadin check with Cicero DuckErika

## 2015-02-05 NOTE — Telephone Encounter (Signed)
Would see if Dr. Judithann SheenSparks will order cxr to evaluate pleural effusion. If still moderate,  May need to talk with surgery for consideration of a thoracentesis.

## 2015-02-06 ENCOUNTER — Other Ambulatory Visit: Payer: Self-pay | Admitting: Cardiothoracic Surgery

## 2015-02-06 DIAGNOSIS — Z951 Presence of aortocoronary bypass graft: Secondary | ICD-10-CM

## 2015-02-06 NOTE — Telephone Encounter (Signed)
Spoke w/ pt.  Advised her of Dr. Windell HummingbirdGollan's recommendation.   She verbalizes understanding and will speak w/ Dr. Judithann SheenSparks at her ov today @ 3:00. She does report that Dr. Judithann SheenSparks told her that they have not received any notes or results from our office.  Advised her to remind of Care Everywhere and that he has access to our system.  Asked her to call if they cannot access this.

## 2015-02-07 ENCOUNTER — Ambulatory Visit: Payer: Medicare Other | Admitting: Cardiovascular Disease

## 2015-02-07 ENCOUNTER — Telehealth: Payer: Self-pay | Admitting: *Deleted

## 2015-02-07 DIAGNOSIS — Z951 Presence of aortocoronary bypass graft: Secondary | ICD-10-CM

## 2015-02-07 NOTE — Telephone Encounter (Signed)
Pt is calling asking for results on X-ray she did.  Stated to her we may not be able to get it done today and she was okay with it  But insisted we try and get it done, for she does not want to wait all weekend on the results.  Please call patient when results are read and ready.

## 2015-02-07 NOTE — Telephone Encounter (Signed)
Chest x-ray reviewed on Friday Sorry for the delay X-ray is beautiful, pleural effusion has resolved There is clear lungs Breathing should be much improved We can probably decrease her diuretic  regiment significantly (lasix) No need for procedure/thoracentesis

## 2015-02-11 NOTE — Telephone Encounter (Signed)
Spoke w/ pt.  Advised her of Dr. Windell HummingbirdGollan's recommendation.  She is appreciative and requests to restart [facility] soon.

## 2015-02-12 ENCOUNTER — Ambulatory Visit (INDEPENDENT_AMBULATORY_CARE_PROVIDER_SITE_OTHER): Payer: Self-pay | Admitting: Cardiothoracic Surgery

## 2015-02-12 ENCOUNTER — Ambulatory Visit (INDEPENDENT_AMBULATORY_CARE_PROVIDER_SITE_OTHER): Payer: Medicare Other | Admitting: *Deleted

## 2015-02-12 ENCOUNTER — Encounter: Payer: Self-pay | Admitting: Cardiothoracic Surgery

## 2015-02-12 VITALS — BP 124/74 | HR 90 | Resp 20 | Ht 68.0 in | Wt 180.0 lb

## 2015-02-12 DIAGNOSIS — Z951 Presence of aortocoronary bypass graft: Secondary | ICD-10-CM

## 2015-02-12 DIAGNOSIS — Z5181 Encounter for therapeutic drug level monitoring: Secondary | ICD-10-CM

## 2015-02-12 DIAGNOSIS — I2699 Other pulmonary embolism without acute cor pulmonale: Secondary | ICD-10-CM

## 2015-02-12 DIAGNOSIS — I480 Paroxysmal atrial fibrillation: Secondary | ICD-10-CM | POA: Diagnosis not present

## 2015-02-12 DIAGNOSIS — I25790 Atherosclerosis of other coronary artery bypass graft(s) with unstable angina pectoris: Secondary | ICD-10-CM

## 2015-02-12 LAB — POCT INR: INR: 1.6

## 2015-02-12 NOTE — Progress Notes (Signed)
PCP is SPARKS,JEFFREY D, MD Referring Provider is Sparks, Duane Lope, MD  Chief Complaint  Patient presents with  . Routine Post Op    f/u from surgery with CXR s/p CABG x4     HPI:1 month followup after urgent CABGx4         10 days ago the patient had shortness of breath and was found have a small pulmonary was was started on Coumadin. She been on chronic Plavix for peripheral arterial disease and now stopped. For shortness of breath has resolved. She is anxious to return to the gym and we'll start cardiac rehabilitation at [facility]. Surgical incisions are healing well. She brings her most recent chest x-ray taken last week showing no effusion ,cardiac silhouette stable and sternal wires intact. She denies edema or recurrent angina.  Past Medical History  Diagnosis Date  . Type 2 diabetes mellitus   . Hyperlipidemia   . Hypertension   . Coronary artery disease     Status post coronary stenting approximately 10 years ago  . Peripheral artery disease   . Acute on chronic diastolic CHF (congestive heart failure) 01/24/2015    Past Surgical History  Procedure Laterality Date  . Tonsillectomy and adenoidectomy    . Cesarean section    . Transluminal atherectomy tibial artery  01/30/2013  . Transluminal angioplasty  01/30/2013    L posterior tibial artery, L tibioperoneal trunk, L SFA  . Appendectomy    . Cardiac catheterization N/A 01/03/2015    Procedure: Left Heart Cath;  Surgeon: Antonieta Iba, MD;  Location: [facility] INVASIVE CV LAB;  Service: Cardiovascular;  Laterality: N/A;  . Coronary artery bypass graft N/A 01/07/2015    Procedure: CORONARY ARTERY BYPASS GRAFTING (CABG) x4 using bilateral greater saphenous vein and left internal mammary artery.;  Surgeon: Kerin Perna, MD;  Location: [facility] OR;  Service: Open Heart Surgery;  Laterality: N/A;    Family History  Problem Relation Age of Onset  . Hypertension Mother   . Cancer Father     Social History History  Substance  Use Topics  . Smoking status: Never Smoker   . Smokeless tobacco: Never Used  . Alcohol Use: No    Current Outpatient Prescriptions  Medication Sig Dispense Refill  . aspirin EC 81 MG EC tablet Take 1 tablet (81 mg total) by mouth daily.    Marland Kitchen guaiFENesin (MUCINEX) 600 MG 12 hr tablet Take 600 mg by mouth daily. Takes 1 tab daily, can take addt'l tab as needed for mucus    . losartan (COZAAR) 25 MG tablet Take 1 tablet (25 mg total) by mouth at bedtime. 30 tablet 1  . metoprolol tartrate (LOPRESSOR) 25 MG tablet Take 0.5 tablets (12.5 mg total) by mouth 2 (two) times daily. 60 tablet 0  . potassium chloride (K-DUR) 10 MEQ tablet Take 1 tablet (10 mEq total) by mouth 2 (two) times daily as needed. 60 tablet 3  . rosuvastatin (CRESTOR) 20 MG tablet Take 1 tablet (20 mg total) by mouth daily. 30 tablet 6  . sitaGLIPtin (JANUVIA) 100 MG tablet Take 100 mg by mouth daily.      Marland Kitchen warfarin (COUMADIN) 5 MG tablet Take 1 tablet (5 mg total) by mouth daily. 30 tablet 6  . zolpidem (AMBIEN) 10 MG tablet Take 10 mg by mouth at bedtime as needed.       No current facility-administered medications for this visit.    Allergies  Allergen Reactions  . Metformin And Related Diarrhea  .  Atorvastatin Cough  . Codeine Nausea Only and Other (See Comments)    Constipates; doesn't like the way it makes her feel   . Oxycodone Hcl Other (See Comments)    01/10/15 patient  tolerated 8 doses of oxycodone with no allergy symptoms 7/12 noted allergy including, Palpitations, difficulty breathing.    Review of Systems   Improving strength appetite No problems with the surgical incisions Not requiring pain medication Concern about her daughter Vernona RiegerLaura is apparently estranged from the patient  BP 124/74 mmHg  Pulse 90  Resp 20  Ht 5\' 8"  (1.727 m)  Wt 180 lb (81.647 kg)  BMI 27.38 kg/m2  SpO2 97% Physical Exam Alert and comfortable Lungs clear Heart rate regular Sternum stable, incision well-healed No  leg edema, Leg incision is healed  Diagnostic Tests: Disc of most recent chest x-ray in the belly reviewed showing clear lungs no pleural effusion  Impression: Excellent recovery one month status post CABG She may lift up to 20 pounds maximum and start cardiac rehabilitation and going to the gym Continue current medications Return as needed     Mikey BussingPeter Van Trigt III, MD [facility] [phone]

## 2015-02-12 NOTE — Telephone Encounter (Signed)
This encounter was created in error - please disregard.

## 2015-02-19 ENCOUNTER — Ambulatory Visit (INDEPENDENT_AMBULATORY_CARE_PROVIDER_SITE_OTHER): Payer: Medicare Other

## 2015-02-19 DIAGNOSIS — I480 Paroxysmal atrial fibrillation: Secondary | ICD-10-CM | POA: Diagnosis not present

## 2015-02-19 DIAGNOSIS — Z5181 Encounter for therapeutic drug level monitoring: Secondary | ICD-10-CM

## 2015-02-19 DIAGNOSIS — I2699 Other pulmonary embolism without acute cor pulmonale: Secondary | ICD-10-CM

## 2015-02-19 LAB — POCT INR: INR: 1.4

## 2015-02-26 ENCOUNTER — Ambulatory Visit (INDEPENDENT_AMBULATORY_CARE_PROVIDER_SITE_OTHER): Payer: Medicare Other

## 2015-02-26 DIAGNOSIS — I2699 Other pulmonary embolism without acute cor pulmonale: Secondary | ICD-10-CM | POA: Diagnosis not present

## 2015-02-26 DIAGNOSIS — Z5181 Encounter for therapeutic drug level monitoring: Secondary | ICD-10-CM

## 2015-02-26 DIAGNOSIS — I480 Paroxysmal atrial fibrillation: Secondary | ICD-10-CM | POA: Diagnosis not present

## 2015-02-26 LAB — POCT INR: INR: 1.2

## 2015-02-26 MED ORDER — WARFARIN SODIUM 5 MG PO TABS: 5.0000 mg | ORAL_TABLET | Freq: Every day | ORAL | Status: DC

## 2015-03-05 ENCOUNTER — Ambulatory Visit (INDEPENDENT_AMBULATORY_CARE_PROVIDER_SITE_OTHER): Payer: Medicare Other

## 2015-03-05 DIAGNOSIS — I480 Paroxysmal atrial fibrillation: Secondary | ICD-10-CM | POA: Diagnosis not present

## 2015-03-05 DIAGNOSIS — Z5181 Encounter for therapeutic drug level monitoring: Secondary | ICD-10-CM | POA: Diagnosis not present

## 2015-03-05 DIAGNOSIS — I2699 Other pulmonary embolism without acute cor pulmonale: Secondary | ICD-10-CM | POA: Diagnosis not present

## 2015-03-05 LAB — POCT INR: INR: 1.9

## 2015-03-12 ENCOUNTER — Ambulatory Visit (INDEPENDENT_AMBULATORY_CARE_PROVIDER_SITE_OTHER): Payer: Medicare Other | Admitting: *Deleted

## 2015-03-12 DIAGNOSIS — I2699 Other pulmonary embolism without acute cor pulmonale: Secondary | ICD-10-CM

## 2015-03-12 DIAGNOSIS — I480 Paroxysmal atrial fibrillation: Secondary | ICD-10-CM

## 2015-03-12 DIAGNOSIS — Z5181 Encounter for therapeutic drug level monitoring: Secondary | ICD-10-CM | POA: Diagnosis not present

## 2015-03-12 LAB — POCT INR: INR: 2.3

## 2015-03-21 ENCOUNTER — Other Ambulatory Visit: Payer: Medicare Other | Admitting: Pharmacist

## 2015-03-21 ENCOUNTER — Ambulatory Visit (INDEPENDENT_AMBULATORY_CARE_PROVIDER_SITE_OTHER): Payer: Medicare Other | Admitting: Pharmacist

## 2015-03-21 DIAGNOSIS — I2699 Other pulmonary embolism without acute cor pulmonale: Secondary | ICD-10-CM

## 2015-03-21 DIAGNOSIS — Z5181 Encounter for therapeutic drug level monitoring: Secondary | ICD-10-CM

## 2015-03-21 DIAGNOSIS — I480 Paroxysmal atrial fibrillation: Secondary | ICD-10-CM

## 2015-03-21 LAB — POCT INR: INR: 2.3

## 2015-04-02 ENCOUNTER — Telehealth: Payer: Self-pay

## 2015-04-02 NOTE — Telephone Encounter (Signed)
Telephoned pt and discussed dry skin and cracking. She states that it has started increasing more lately. She is using moisturizing hand soap and lotion but bleeding at cracks continues. She denies any changes in meds or diet, no other bleeding. Suggested going into office today for INR check and follow up with PCP for dry irritated skin. Pt states that she will follow up with PCP and just wait for INR check next week.

## 2015-04-02 NOTE — Telephone Encounter (Signed)
Pt states her hands and knuckles are cracking open and bleeding. States she washes her hands a lot during the day, and she is concerned. States it takes a long time to stop the bleeding. States over the weekend, all of her knuckles on each had cracked open. Please call back.

## 2015-04-09 ENCOUNTER — Ambulatory Visit (INDEPENDENT_AMBULATORY_CARE_PROVIDER_SITE_OTHER): Payer: Medicare Other

## 2015-04-09 DIAGNOSIS — I2699 Other pulmonary embolism without acute cor pulmonale: Secondary | ICD-10-CM | POA: Diagnosis not present

## 2015-04-09 DIAGNOSIS — Z5181 Encounter for therapeutic drug level monitoring: Secondary | ICD-10-CM | POA: Diagnosis not present

## 2015-04-09 DIAGNOSIS — I480 Paroxysmal atrial fibrillation: Secondary | ICD-10-CM

## 2015-04-09 LAB — POCT INR: INR: 2.3

## 2015-04-09 MED ORDER — WARFARIN SODIUM 5 MG PO TABS: ORAL_TABLET | ORAL | Status: DC

## 2015-05-14 ENCOUNTER — Ambulatory Visit (INDEPENDENT_AMBULATORY_CARE_PROVIDER_SITE_OTHER): Payer: Medicare Other

## 2015-05-14 DIAGNOSIS — I480 Paroxysmal atrial fibrillation: Secondary | ICD-10-CM

## 2015-05-14 DIAGNOSIS — I2699 Other pulmonary embolism without acute cor pulmonale: Secondary | ICD-10-CM | POA: Diagnosis not present

## 2015-05-14 DIAGNOSIS — Z5181 Encounter for therapeutic drug level monitoring: Secondary | ICD-10-CM | POA: Diagnosis not present

## 2015-05-14 LAB — POCT INR: INR: 2.4

## 2015-06-02 ENCOUNTER — Other Ambulatory Visit: Payer: Self-pay

## 2015-06-02 DIAGNOSIS — Z1231 Encounter for screening mammogram for malignant neoplasm of breast: Secondary | ICD-10-CM

## 2015-06-11 ENCOUNTER — Encounter: Payer: Medicare Other | Attending: Surgery | Admitting: Surgery

## 2015-06-11 DIAGNOSIS — L97421 Non-pressure chronic ulcer of left heel and midfoot limited to breakdown of skin: Secondary | ICD-10-CM | POA: Diagnosis not present

## 2015-06-11 DIAGNOSIS — E11621 Type 2 diabetes mellitus with foot ulcer: Secondary | ICD-10-CM | POA: Insufficient documentation

## 2015-06-11 DIAGNOSIS — I739 Peripheral vascular disease, unspecified: Secondary | ICD-10-CM | POA: Diagnosis not present

## 2015-06-11 DIAGNOSIS — I25119 Atherosclerotic heart disease of native coronary artery with unspecified angina pectoris: Secondary | ICD-10-CM | POA: Diagnosis not present

## 2015-06-11 DIAGNOSIS — I70245 Atherosclerosis of native arteries of left leg with ulceration of other part of foot: Secondary | ICD-10-CM | POA: Insufficient documentation

## 2015-06-11 DIAGNOSIS — E1140 Type 2 diabetes mellitus with diabetic neuropathy, unspecified: Secondary | ICD-10-CM | POA: Insufficient documentation

## 2015-06-11 DIAGNOSIS — I10 Essential (primary) hypertension: Secondary | ICD-10-CM | POA: Insufficient documentation

## 2015-06-12 NOTE — Progress Notes (Signed)
[name], Shanasia K. ([id number]) Visit Report for 06/11/2015 Abuse/Suicide Risk Screen Details Patient Name: [name], [name] Date of Service: 06/11/2015 10:15 AM Medical Record Patient Account Number: [id number] Number: Treating RN: Curtis SitesDorthy, Joanna Dec 14, 1950 (64 y.o. Other Clinician: Date of Birth/Sex: Female) Treating BURNS III, Primary Care Physician: Aram BeechamSparks, Jeffrey Physician/Extender: Zollie BeckersWALTER Referring Physician: Bing PlumeSparks, Jeffrey Weeks in Treatment: 0 Abuse/Suicide Risk Screen Items Answer ABUSE/SUICIDE RISK SCREEN: Has anyone close to you tried to hurt or harm you recentlyo No Do you feel uncomfortable with anyone in your familyo No Has anyone forced you do things that you didnot want to doo No Do you have any thoughts of harming yourselfo No Patient displays signs or symptoms of abuse and/or neglect. No Electronic Signature(s) Signed: 06/11/2015 5:24:05 PM By: Curtis Sitesorthy, Joanna Entered By: Curtis Sitesorthy, Joanna on 06/11/2015 10:23:29 Owusu, [name] ([id number]) -------------------------------------------------------------------------------- Activities of Daily Living Details Patient Name: Dominique MarkerOTEAT, [name] Date of Service: 06/11/2015 10:15 AM Medical Record Patient Account Number: [id number] Number: Treating RN: Curtis SitesDorthy, Joanna Dec 14, 1950 (64 y.o. Other Clinician: Date of Birth/Sex: Female) Treating BURNS III, Primary Care Physician: Aram BeechamSparks, Jeffrey Physician/Extender: Zollie BeckersWALTER Referring Physician: Bing PlumeSparks, Jeffrey Weeks in Treatment: 0 Activities of Daily Living Items Answer Activities of Daily Living (Please select one for each item) Drive Automobile Completely Able Take Medications Completely Able Use Telephone Completely Able Care for Appearance Completely Able Use Toilet Completely Able Bath / Shower Completely Able Dress Self Completely Able Feed Self Completely Able Walk Completely Able Get In / Out Bed Completely Able Housework Completely  Able Prepare Meals Completely Able Handle Money Completely Able Shop for Self Completely Able Electronic Signature(s) Signed: 06/11/2015 5:24:05 PM By: Curtis Sitesorthy, Joanna Entered By: Curtis Sitesorthy, Joanna on 06/11/2015 10:23:52 Aultman, [name] ([id number]) -------------------------------------------------------------------------------- Education Assessment Details Patient Name: Dominique MaffucciPOTEAT, [name] Date of Service: 06/11/2015 10:15 AM Medical Record Patient Account Number: [id number] Number: Treating RN: Curtis SitesDorthy, Joanna Dec 14, 1950 (64 y.o. Other Clinician: Date of Birth/Sex: Female) Treating BURNS III, Primary Care Physician: Aram BeechamSparks, Jeffrey Physician/Extender: Zollie BeckersWALTER Referring Physician: Bing PlumeSparks, Jeffrey Weeks in Treatment: 0 Primary Learner Assessed: Patient Learning Preferences/Education Level/Primary Language Learning Preference: Explanation, Demonstration Highest Education Level: College or Above Preferred Language: English Cognitive Barrier Assessment/Beliefs Language Barrier: No Translator Needed: No Memory Deficit: No Emotional Barrier: No Cultural/Religious Beliefs Affecting Medical No Care: Physical Barrier Assessment Impaired Vision: No Impaired Hearing: No Decreased Hand dexterity: No Knowledge/Comprehension Assessment Knowledge Level: Medium Comprehension Level: Medium Ability to understand written Medium instructions: Ability to understand verbal Medium instructions: Motivation Assessment Anxiety Level: Calm Cooperation: Cooperative Education Importance: Acknowledges Need Interest in Health Problems: Asks Questions Perception: Coherent Willingness to Engage in Self- Medium Management Activities: Readiness to Engage in Self- Medium Management Activities: [name], [name] ([id number]) Electronic Signature(s) Signed: 06/11/2015 5:24:05 PM By: Curtis Sitesorthy, Joanna Entered By: Curtis Sitesorthy, Joanna on 06/11/2015 10:24:22 Kotz, [name]  ([id number]) -------------------------------------------------------------------------------- Fall Risk Assessment Details Patient Name: Dominique MaffucciPOTEAT, [name] Date of Service: 06/11/2015 10:15 AM Medical Record Patient Account Number: [id number] Number: Treating RN: Curtis SitesDorthy, Joanna Dec 14, 1950 (64 y.o. Other Clinician: Date of Birth/Sex: Female) Treating BURNS III, Primary Care Physician: Aram BeechamSparks, Jeffrey Physician/Extender: Zollie BeckersWALTER Referring Physician: Bing PlumeSparks, Jeffrey Weeks in Treatment: 0 Fall Risk Assessment Items FALL RISK ASSESSMENT: History of falling - immediate or within 3 months 0 No Secondary diagnosis 0 No Ambulatory aid None/bed rest/wheelchair/nurse 0 Yes Crutches/cane/walker 0 No Furniture 0 No IV Access/Saline Lock 0 No Gait/Training Normal/bed rest/immobile 0 Yes Weak 0 No Impaired 0 No Mental Status Oriented to own ability 0 Yes  Electronic Signature(s) Signed: 06/11/2015 5:24:05 PM By: Curtis Sites Entered By: Curtis Sites on 06/11/2015 10:24:30 Shams, [name] ([id number]) -------------------------------------------------------------------------------- Foot Assessment Details Patient Name: Dominique Marker K. Date of Service: 06/11/2015 10:15 AM Medical Record Patient Account Number: [id number] Number: Treating RN: Curtis Sites 07-06-1951 (64 y.o. Other Clinician: Date of Birth/Sex: Female) Treating BURNS III, Primary Care Physician: Aram Beecham Physician/Extender: Zollie Beckers Referring Physician: Bing Plume in Treatment: 0 Foot Assessment Items Site Locations + = Sensation present, - = Sensation absent, C = Callus, U = Ulcer R = Redness, W = Warmth, M = Maceration, PU = Pre-ulcerative lesion F = Fissure, S = Swelling, D = Dryness Assessment Right: Left: Other Deformity: No No Prior Foot Ulcer: No No Prior Amputation: No No Charcot Joint: No No Ambulatory Status: Ambulatory Without Help Gait: Steady Electronic  Signature(s) Signed: 06/11/2015 5:24:05 PM By: Curtis Sites Entered By: Curtis Sites on 06/11/2015 10:32:06 Butler, [name] ([id number]) -------------------------------------------------------------------------------- Nutrition Risk Assessment Details Patient Name: Dominique Marker K. Date of Service: 06/11/2015 10:15 AM Medical Record Patient Account Number: [id number] Number: Treating RN: Curtis Sites July 29, 1951 (64 y.o. Other Clinician: Date of Birth/Sex: Female) Treating BURNS III, Primary Care Physician: Aram Beecham Physician/Extender: Zollie Beckers Referring Physician: Bing Plume in Treatment: 0 Height (in): 68 Weight (lbs): 154 Body Mass Index (BMI): 23.4 Nutrition Risk Assessment Items NUTRITION RISK SCREEN: I have an illness or condition that made me change the kind and/or 0 No amount of food I eat I eat fewer than two meals per day 0 No I eat few fruits and vegetables, or milk products 0 No I have three or more drinks of beer, liquor or wine almost every day 0 No I have tooth or mouth problems that make it hard for me to eat 0 No I don't always have enough money to buy the food I need 0 No I eat alone most of the time 0 No I take three or more different prescribed or over-the-counter drugs a 1 Yes day Without wanting to, I have lost or gained 10 pounds in the last six 0 No months I am not always physically able to shop, cook and/or feed myself 0 No Nutrition Protocols Good Risk Protocol 0 No interventions needed Moderate Risk Protocol Electronic Signature(s) Signed: 06/11/2015 5:24:05 PM By: Curtis Sites Entered By: Curtis Sites on 06/11/2015 10:24:39
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
## 2015-06-12 NOTE — Progress Notes (Signed)
[name], Xcaret K. ([id number]) Visit Report for 06/11/2015 Allergy List Details Patient Name: [name], [name] Date of Service: 06/11/2015 10:15 AM Medical Record Number: [id number] Patient Account Number: [id number] Date of Birth/Sex: Aug 05, 1951 (64 y.o. Female) Treating RN: Curtis Sitesorthy, Joanna Primary Care Physician: Aram BeechamSparks, Jeffrey Other Clinician: Referring Physician: Aram BeechamSparks, Jeffrey Treating Physician/Extender: BURNS III, Regis BillWALTER Weeks in Treatment: 0 Allergies Active Allergies Biaxin Allergy Notes Electronic Signature(s) Signed: 06/11/2015 5:24:05 PM By: Curtis Sitesorthy, Joanna Entered By: Curtis Sitesorthy, Joanna on 06/11/2015 10:20:39 Persichetti, [name] ([id number]) -------------------------------------------------------------------------------- Arrival Information Details Patient Name: Dominique MaffucciPOTEAT, [name] Date of Service: 06/11/2015 10:15 AM Medical Record Number: [id number] Patient Account Number: [id number] Date of Birth/Sex: Aug 05, 1951 (64 y.o. Female) Treating RN: Curtis Sitesorthy, Joanna Primary Care Physician: Aram BeechamSparks, Jeffrey Other Clinician: Referring Physician: Aram BeechamSparks, Jeffrey Treating Physician/Extender: BURNS III, Regis BillWALTER Weeks in Treatment: 0 Visit Information Patient Arrived: Ambulatory Arrival Time: 10:15 Accompanied By: self Transfer Assistance: None Patient Identification Verified: Yes Secondary Verification Process Yes Completed: Patient Has Alerts: Yes Patient Alerts: Patient on Blood Thinner DMII warfarin History Since Last Visit Added or deleted any medications: No Any new allergies or adverse reactions: No Had a fall or experienced change in activities of daily living that may affect risk of falls: No Signs or symptoms of abuse/neglect since last visito No Hospitalized since last visit: No Electronic Signature(s) Signed: 06/11/2015 5:24:05 PM By: Curtis Sitesorthy, Joanna Entered By: Curtis Sitesorthy, Joanna on 06/11/2015 10:17:31 Ryles, Georgianna Kirtland BouchardK.  ([id number]) -------------------------------------------------------------------------------- Clinic Level of Care Assessment Details Patient Name: Dominique MaffucciPOTEAT, [name] Date of Service: 06/11/2015 10:15 AM Medical Record Number: [id number] Patient Account Number: [id number] Date of Birth/Sex: Aug 05, 1951 (64 y.o. Female) Treating RN: Curtis Sitesorthy, Joanna Primary Care Physician: Aram BeechamSparks, Jeffrey Other Clinician: Referring Physician: Aram BeechamSparks, Jeffrey Treating Physician/Extender: BURNS III, Regis BillWALTER Weeks in Treatment: 0 Clinic Level of Care Assessment Items TOOL 1 Quantity Score []  - Use when EandM and Procedure is performed on INITIAL visit 0 ASSESSMENTS - Nursing Assessment / Reassessment X - General Physical Exam (combine w/ comprehensive assessment (listed just 1 20 below) when performed on new pt. evals) X - Comprehensive Assessment (HX, ROS, Risk Assessments, Wounds Hx, etc.) 1 25 ASSESSMENTS - Wound and Skin Assessment / Reassessment []  - Dermatologic / Skin Assessment (not related to wound area) 0 ASSESSMENTS - Ostomy and/or Continence Assessment and Care []  - Incontinence Assessment and Management 0 []  - Ostomy Care Assessment and Management (repouching, etc.) 0 PROCESS - Coordination of Care X - Simple Patient / Family Education for ongoing care 1 15 []  - Complex (extensive) Patient / Family Education for ongoing care 0 []  - Staff obtains ChiropractorConsents, Records, Test Results / Process Orders 0 []  - Staff telephones HHA, Nursing Homes / Clarify orders / etc 0 []  - Routine Transfer to another Facility (non-emergent condition) 0 []  - Routine Hospital Admission (non-emergent condition) 0 X - New Admissions / Manufacturing engineernsurance Authorizations / Ordering NPWT, Apligraf, etc. 1 15 []  - Emergency Hospital Admission (emergent condition) 0 PROCESS - Special Needs []  - Pediatric / Minor Patient Management 0 []  - Isolation Patient Management 0 Hartline, Zo K. ([id number]) []  - Hearing / Language / Visual special  needs 0 []  - Assessment of Community assistance (transportation, D/C planning, etc.) 0 []  - Additional assistance / Altered mentation 0 []  - Support Surface(s) Assessment (bed, cushion, seat, etc.) 0 INTERVENTIONS - Miscellaneous []  - External ear exam 0 []  - Patient Transfer (multiple staff / Nurse, adultHoyer Lift / Similar devices) 0 []  - Simple Staple / Suture removal (25 or less) 0 []  -  Complex Staple / Suture removal (26 or more) 0 []  - Hypo/Hyperglycemic Management (do not check if billed separately) 0 X - Ankle / Brachial Index (ABI) - do not check if billed separately 1 15 Has the patient been seen at the hospital within the last three years: Yes Total Score: 90 Level Of Care: New/Established - Level 3 Electronic Signature(s) Signed: 06/11/2015 5:24:05 PM By: Curtis Sites Entered By: Curtis Sites on 06/11/2015 10:58:18 Qin, [name] ([id number]) -------------------------------------------------------------------------------- Encounter Discharge Information Details Patient Name: [name]. Date of Service: 06/11/2015 10:15 AM Medical Record Number: [id number] Patient Account Number: [id number] Date of Birth/Sex: 19-May-1951 (64 y.o. Female) Treating RN: Curtis Sites Primary Care Physician: Aram Beecham Other Clinician: Referring Physician: Aram Beecham Treating Physician/Extender: BURNS III, Regis Bill in Treatment: 0 Encounter Discharge Information Items Discharge Pain Level: 0 Discharge Condition: Stable Ambulatory Status: Ambulatory Discharge Destination: Home Transportation: Private Auto Accompanied By: self Schedule Follow-up Appointment: Yes Medication Reconciliation completed and provided to Patient/Care No Nitzia Perren: Provided on Clinical Summary of Care: 06/11/2015 Form Type Recipient Paper Patient PP Electronic Signature(s) Signed: 06/11/2015 11:15:51 AM By: Gwenlyn Perking Entered By: Gwenlyn Perking on 06/11/2015 11:15:51 Wellman, Anjanae K.  ([id number]) -------------------------------------------------------------------------------- Lower Extremity Assessment Details Patient Name: [name] Date of Service: 06/11/2015 10:15 AM Medical Record Number: [id number] Patient Account Number: [id number] Date of Birth/Sex: [birth year] (64 y.o. Female) Treating RN: Curtis Sites Primary Care Physician: Aram Beecham Other Clinician: Referring Physician: Aram Beecham Treating Physician/Extender: BURNS III, Regis Bill in Treatment: 0 Edema Assessment Assessed: [Left: No] [Right: No] Edema: [Left: No] [Right: No] Vascular Assessment Pulses: Posterior Tibial Palpable: [Left:Yes] Dorsalis Pedis Palpable: [Left:Yes] Extremity colors, hair growth, and conditions: Extremity Color: [Left:Normal] Hair Growth on Extremity: [Left:Yes] Temperature of Extremity: [Left:Warm] Capillary Refill: [Left:< 3 seconds] Blood Pressure: Brachial: [Left:152] Dorsalis Pedis: [Left:Dorsalis Pedis:] Ankle: Posterior Tibial: 122 [Left:Posterior Tibial: 0.80] Toe Nail Assessment Left: Right: Thick: No Discolored: No Deformed: No Improper Length and Hygiene: No Electronic Signature(s) Signed: 06/11/2015 5:24:05 PM By: Curtis Sites Entered By: Curtis Sites on 06/11/2015 10:42:44 Brayboy, Marthe K. ([id number]) -------------------------------------------------------------------------------- Multi Wound Chart Details Patient Name: [name] Date of Service: 06/11/2015 10:15 AM Medical Record Number: [id number] Patient Account Number: [id number] Date of Birth/Sex: [birth year] (64 y.o. Female) Treating RN: Curtis Sites Primary Care Physician: Aram Beecham Other Clinician: Referring Physician: Aram Beecham Treating Physician/Extender: BURNS III, Regis Bill in Treatment: 0 Vital Signs Height(in): 68 Pulse(bpm): 73 Weight(lbs): 154 Blood Pressure 161/56 (mmHg): Body Mass Index(BMI): 23 Temperature(F):  98.2 Respiratory Rate 18 (breaths/min): Photos: [2:No Photos] [N/A:N/A] Wound Location: [2:Left Foot - Plantar] [N/A:N/A] Wounding Event: [2:Trauma] [N/A:N/A] Primary Etiology: [2:Diabetic Wound/Ulcer of the Lower Extremity] [N/A:N/A] Comorbid History: [2:Cataracts, Angina, Arrhythmia, Coronary Artery Disease, Hypertension, Peripheral Arterial Disease, Type II Diabetes] [N/A:N/A] Date Acquired: [2:05/21/2015] [N/A:N/A] Weeks of Treatment: [2:0] [N/A:N/A] Wound Status: [2:Open] [N/A:N/A] Measurements L x W x D 0.2x0.4x0.4 [N/A:N/A] (cm) Area (cm) : [2:0.063] [N/A:N/A] Volume (cm) : [2:0.025] [N/A:N/A] Classification: [2:Grade 1] [N/A:N/A] Exudate Amount: [2:Medium] [N/A:N/A] Exudate Type: [2:Serous] [N/A:N/A] Exudate Color: [2:amber] [N/A:N/A] Wound Margin: [2:Flat and Intact] [N/A:N/A] Granulation Amount: [2:Large (67-100%)] [N/A:N/A] Granulation Quality: [2:Pink] [N/A:N/A] Necrotic Amount: [2:None Present (0%)] [N/A:N/A] Exposed Structures: [2:Fascia: No Fat: No Tendon: No Muscle: No] [N/A:N/A] Joint: No Bone: No Limited to Skin Breakdown Epithelialization: None N/A N/A Debridement: Debridement ([id number]- N/A N/A 11047) Time-Out Taken: Yes N/A N/A Pain Control: Lidocaine 4% Topical N/A N/A Solution Tissue Debrided: Fibrin/Slough, Callus N/A N/A Level: Skin/Subcutaneous N/A N/A Tissue Debridement Area (sq  0.16 N/A N/A cm): Instrument: Curette N/A N/A Bleeding: Minimum N/A N/A Procedural Pain: 0 N/A N/A Post Procedural Pain: 0 N/A N/A Debridement Treatment Procedure was tolerated N/A N/A Response: well Post Debridement 0.2x0.4x0.4 N/A N/A Measurements L x W x D (cm) Post Debridement 0.025 N/A N/A Volume: (cm) Periwound Skin Texture: Callus: Yes N/A N/A Edema: No Excoriation: No Induration: No Crepitus: No Fluctuance: No Friable: No Rash: No Scarring: No Periwound Skin Moist: Yes N/A N/A Moisture: Maceration: No Dry/Scaly: No Periwound Skin Color:  Atrophie Blanche: No N/A N/A Cyanosis: No Ecchymosis: No Erythema: No Hemosiderin Staining: No Mottled: No Pallor: No Rubor: No Temperature: No Abnormality N/A N/A Tenderness on Yes N/A N/A Palpation: Wound Preparation: N/A N/A ELI, ADAMI. ([id number]) Ulcer Cleansing: Rinsed/Irrigated with Saline Topical Anesthetic Applied: Other: lidocaine 4% Procedures Performed: Debridement N/A N/A Treatment Notes Electronic Signature(s) Signed: 06/11/2015 5:24:05 PM By: Curtis Sites Entered By: Curtis Sites on 06/11/2015 10:56:32 Vath, Kori Kirtland Bouchard ([id number]) -------------------------------------------------------------------------------- Multi-Disciplinary Care Plan Details Patient Name: ALESI, ZACHERY. Date of Service: 06/11/2015 10:15 AM Medical Record Number: [id number] Patient Account Number: [id number] Date of Birth/Sex: 07-21-1951 (64 y.o. Female) Treating RN: Curtis Sites Primary Care Physician: Aram Beecham Other Clinician: Referring Physician: Aram Beecham Treating Physician/Extender: BURNS III, Regis Bill in Treatment: 0 Active Inactive Wound/Skin Impairment Nursing Diagnoses: Impaired tissue integrity Goals: Patient/caregiver will verbalize understanding of skin care regimen Date Initiated: 06/11/2015 Goal Status: Active Ulcer/skin breakdown will have a volume reduction of 30% by week 4 Date Initiated: 06/11/2015 Goal Status: Active Ulcer/skin breakdown will have a volume reduction of 50% by week 8 Date Initiated: 06/11/2015 Goal Status: Active Ulcer/skin breakdown will have a volume reduction of 80% by week 12 Date Initiated: 06/11/2015 Goal Status: Active Ulcer/skin breakdown will heal within 14 weeks Date Initiated: 06/11/2015 Goal Status: Active Interventions: Assess patient/caregiver ability to obtain necessary supplies Assess ulceration(s) every visit Notes: Electronic Signature(s) Signed: 06/11/2015 5:24:05 PM By: Curtis Sites Entered  By: Curtis Sites on 06/11/2015 10:56:18 Licea, [name] ([id number]) -------------------------------------------------------------------------------- Patient/Caregiver Education Details Patient Name: [name]. Date of Service: 06/11/2015 10:15 AM Medical Record Number: [id number] Patient Account Number: [id number] Date of Birth/Gender: 1951/04/25 (64 y.o. Female) Treating RN: Curtis Sites Primary Care Physician: Aram Beecham Other Clinician: Referring Physician: Aram Beecham Treating Physician/Extender: BURNS III, Regis Bill in Treatment: 0 Education Assessment Education Provided To: Patient Education Topics Provided Wound/Skin Impairment: Handouts: Other: wound care as ordered Methods: Demonstration, Explain/Verbal Responses: State content correctly Electronic Signature(s) Signed: 06/11/2015 5:24:05 PM By: Curtis Sites Entered By: Curtis Sites on 06/11/2015 11:13:57 Ruffino, Everlyn K. ([id number]) -------------------------------------------------------------------------------- Wound Assessment Details Patient Name: [name] Date of Service: 06/11/2015 10:15 AM Medical Record Number: [id number] Patient Account Number: [id number] Date of Birth/Sex: 06-08-1951 (64 y.o. Female) Treating RN: Curtis Sites Primary Care Physician: Aram Beecham Other Clinician: Referring Physician: Aram Beecham Treating Physician/Extender: BURNS III, Regis Bill in Treatment: 0 Wound Status Wound Number: 2 Primary Diabetic Wound/Ulcer of the Lower Etiology: Extremity Wound Location: Left Foot - Plantar Wound Open Wounding Event: Trauma Status: Date Acquired: 05/21/2015 Comorbid Cataracts, Angina, Arrhythmia, Weeks Of Treatment: 0 History: Coronary Artery Disease, Hypertension, Clustered Wound: No Peripheral Arterial Disease, Type II Diabetes Photos Photo Uploaded By: Curtis Sites on 06/11/2015 11:23:17 Wound Measurements Length: (cm) 0.2 Width: (cm)  0.4 Depth: (cm) 0.4 Area: (cm) 0.063 Volume: (cm) 0.025 % Reduction in Area: % Reduction in Volume: Epithelialization: None Tunneling: No Undermining: No Wound Description Classification: Grade 1 Wound Margin: Flat and Intact Exudate  Amount: Medium Exudate Type: Serous Exudate Color: amber Foul Odor After Cleansing: No Wound Bed Granulation Amount: Large (67-100%) Exposed Structure Granulation Quality: Pink Fascia Exposed: No Necrotic Amount: None Present (0%) Fat Layer Exposed: No Sonnen, Janifer K. ([id number]) Tendon Exposed: No Muscle Exposed: No Joint Exposed: No Bone Exposed: No Limited to Skin Breakdown Periwound Skin Texture Texture Color No Abnormalities Noted: No No Abnormalities Noted: No Callus: Yes Atrophie Blanche: No Crepitus: No Cyanosis: No Excoriation: No Ecchymosis: No Fluctuance: No Erythema: No Friable: No Hemosiderin Staining: No Induration: No Mottled: No Localized Edema: No Pallor: No Rash: No Rubor: No Scarring: No Temperature / Pain Moisture Temperature: No Abnormality No Abnormalities Noted: No Tenderness on Palpation: Yes Dry / Scaly: No Maceration: No Moist: Yes Wound Preparation Ulcer Cleansing: Rinsed/Irrigated with Saline Topical Anesthetic Applied: Other: lidocaine 4%, Treatment Notes Wound #2 (Left, Plantar Foot) 1. Cleansed with: Clean wound with Normal Saline 2. Anesthetic Topical Lidocaine 4% cream to wound bed prior to debridement 3. Peri-wound Care: Skin Prep 4. Dressing Applied: Aquacel Ag Other dressing (specify in notes) 5. Secondary Dressing Applied Dry Gauze 6. Footwear/Offloading device applied Other footwear/offloading device applied (specify in notes) Notes darco shoe with peg assist, comfeel plus Eskew, Arienna K. ([id number]) Electronic Signature(s) Signed: 06/11/2015 5:24:05 PM By: Curtis Sites Entered By: Curtis Sites on 06/11/2015 10:34:43 Soules, Shaia K.  ([id number]) -------------------------------------------------------------------------------- Vitals Details Patient Name: [name] Date of Service: 06/11/2015 10:15 AM Medical Record Number: [id number] Patient Account Number: [id number] Date of Birth/Sex: 06-Nov-1950 (64 y.o. Female) Treating RN: Curtis Sites Primary Care Physician: Aram Beecham Other Clinician: Referring Physician: Aram Beecham Treating Physician/Extender: BURNS III, Regis Bill in Treatment: 0 Vital Signs Time Taken: 10:18 Temperature (F): 98.2 Height (in): 68 Pulse (bpm): 73 Source: Stated Respiratory Rate (breaths/min): 18 Weight (lbs): 154 Blood Pressure (mmHg): 161/56 Source: Stated Reference Range: 80 - 120 mg / dl Body Mass Index (BMI): 23.4 Electronic Signature(s) Signed: 06/11/2015 5:24:05 PM By: Curtis Sites Entered By: Curtis Sites on 06/11/2015 10:19:59

## 2015-06-18 ENCOUNTER — Ambulatory Visit: Payer: Medicare Other | Admitting: Surgery

## 2015-06-18 ENCOUNTER — Encounter: Payer: Medicare Other | Admitting: Surgery

## 2015-06-18 DIAGNOSIS — E11621 Type 2 diabetes mellitus with foot ulcer: Secondary | ICD-10-CM | POA: Diagnosis not present

## 2015-06-20 NOTE — Progress Notes (Signed)
[name], Lakeya K. ([id number]) Visit Report for 06/18/2015 Arrival Information Details Patient Name: [name], [name] Date of Service: 06/18/2015 11:15 AM Medical Record Number: [id number] Patient Account Number: [id number] Date of Birth/Sex: [birth year] (64 y.o. Female) Treating RN: Curtis Sitesorthy, Joanna Primary Care Physician: Aram BeechamSparks, Jeffrey Other Clinician: Referring Physician: Aram BeechamSparks, Jeffrey Treating Physician/Extender: BURNS III, Regis BillWALTER Weeks in Treatment: 1 Visit Information History Since Last Visit Added or deleted any medications: No Patient Arrived: Ambulatory Any new allergies or adverse reactions: No Arrival Time: 11:14 Had a fall or experienced change in No Accompanied By: self activities of daily living that may affect Transfer Assistance: None risk of falls: Patient Identification Verified: Yes Signs or symptoms of abuse/neglect since last No Secondary Verification Process Yes visito Completed: Hospitalized since last visit: No Patient Has Alerts: Yes Pain Present Now: No Patient Alerts: Patient on Blood Thinner DMII warfarin Electronic Signature(s) Signed: 06/19/2015 5:07:12 PM By: Curtis Sitesorthy, Joanna Entered By: Curtis Sitesorthy, Joanna on 06/18/2015 11:14:33 Pagan, Coreena Kirtland BouchardK. ([id number]) -------------------------------------------------------------------------------- Encounter Discharge Information Details Patient Name: Suanne [name], Jawana K. Date of Service: 06/18/2015 11:15 AM Medical Record Number: [id number] Patient Account Number: [id number] Date of Birth/Sex: [birth year] (64 y.o. Female) Treating RN: Curtis Sitesorthy, Joanna Primary Care Physician: Aram BeechamSparks, Jeffrey Other Clinician: Referring Physician: Aram BeechamSparks, Jeffrey Treating Physician/Extender: BURNS III, Regis BillWALTER Weeks in Treatment: 1 Encounter Discharge Information Items Discharge Pain Level: 0 Discharge Condition: Stable Ambulatory Status: Ambulatory Discharge Destination: Home Transportation: Private Auto Accompanied By:  self Schedule Follow-up Appointment: Yes Medication Reconciliation completed and provided to Patient/Care No Beila Purdie: Provided on Clinical Summary of Care: 06/18/2015 Form Type Recipient Paper Patient PP Electronic Signature(s) Signed: 06/18/2015 11:45:30 AM By: Gwenlyn PerkingMoore, Shelia Entered By: Gwenlyn PerkingMoore, Shelia on 06/18/2015 11:45:29 Catala, Seniya K. ([id number]) -------------------------------------------------------------------------------- Lower Extremity Assessment Details Patient Name: Suanne [name], [name] Date of Service: 06/18/2015 11:15 AM Medical Record Number: [id number] Patient Account Number: [id number] Date of Birth/Sex: [birth year] (64 y.o. Female) Treating RN: Curtis Sitesorthy, Joanna Primary Care Physician: Aram BeechamSparks, Jeffrey Other Clinician: Referring Physician: Aram BeechamSparks, Jeffrey Treating Physician/Extender: BURNS III, Regis BillWALTER Weeks in Treatment: 1 Edema Assessment Assessed: [Left: No] [Right: No] Edema: [Left: N] [Right: o] Vascular Assessment Pulses: Posterior Tibial Dorsalis Pedis Palpable: [Left:Yes] Extremity colors, hair growth, and conditions: Extremity Color: [Left:Normal] Hair Growth on Extremity: [Left:Yes] Temperature of Extremity: [Left:Warm] Capillary Refill: [Left:< 3 seconds] Electronic Signature(s) Signed: 06/19/2015 5:07:12 PM By: Curtis Sitesorthy, Joanna Entered By: Curtis Sitesorthy, Joanna on 06/18/2015 11:23:20 Peloso, Sumire K. ([id number]) -------------------------------------------------------------------------------- Multi Wound Chart Details Patient Name: Suanne [name], [name] Date of Service: 06/18/2015 11:15 AM Medical Record Number: [id number] Patient Account Number: [id number] Date of Birth/Sex: [birth year] (64 y.o. Female) Treating RN: Curtis Sitesorthy, Joanna Primary Care Physician: Aram BeechamSparks, Jeffrey Other Clinician: Referring Physician: Aram BeechamSparks, Jeffrey Treating Physician/Extender: BURNS III, Regis BillWALTER Weeks in Treatment: 1 Vital Signs Height(in): 68 Pulse(bpm): 90 Weight(lbs): 154 Blood  Pressure 156/63 (mmHg): Body Mass Index(BMI): 23 Temperature(F): 98.4 Respiratory Rate 18 (breaths/min): Photos: [2:No Photos] [N/A:N/A] Wound Location: [2:Left Foot - Plantar] [N/A:N/A] Wounding Event: [2:Trauma] [N/A:N/A] Primary Etiology: [2:Diabetic Wound/Ulcer of the Lower Extremity] [N/A:N/A] Comorbid History: [2:Cataracts, Angina, Arrhythmia, Coronary Artery Disease, Hypertension, Peripheral Arterial Disease, Type II Diabetes] [N/A:N/A] Date Acquired: [2:05/21/2015] [N/A:N/A] Weeks of Treatment: [2:1] [N/A:N/A] Wound Status: [2:Open] [N/A:N/A] Measurements L x W x D 0.2x0.2x0.3 [N/A:N/A] (cm) Area (cm) : [2:0.031] [N/A:N/A] Volume (cm) : [2:0.009] [N/A:N/A] % Reduction in Area: [2:50.80%] [N/A:N/A] % Reduction in Volume: 64.00% [N/A:N/A] Classification: [2:Grade 1] [N/A:N/A] Exudate Amount: [2:Medium] [N/A:N/A] Exudate Type: [2:Serous] [N/A:N/A] Exudate Color: [2:amber] [N/A:N/A] Wound Margin: [2:Flat and Intact] [N/A:N/A] Granulation Amount: [2:Large (  67-100%)] [N/A:N/A] Granulation Quality: [2:Pink] [N/A:N/A] Necrotic Amount: [2:None Present (0%)] [N/A:N/A] Exposed Structures: [2:Fascia: No Fat: No] [N/A:N/A] Tendon: No Muscle: No Joint: No Bone: No Limited to Skin Breakdown Epithelialization: None N/A N/A Periwound Skin Texture: Callus: Yes N/A N/A Edema: No Excoriation: No Induration: No Crepitus: No Fluctuance: No Friable: No Rash: No Scarring: No Periwound Skin Moist: Yes N/A N/A Moisture: Maceration: No Dry/Scaly: No Periwound Skin Color: Atrophie Blanche: No N/A N/A Cyanosis: No Ecchymosis: No Erythema: No Hemosiderin Staining: No Mottled: No Pallor: No Rubor: No Temperature: No Abnormality N/A N/A Tenderness on Yes N/A N/A Palpation: Wound Preparation: Ulcer Cleansing: N/A N/A Rinsed/Irrigated with Saline Topical Anesthetic Applied: Other: lidocaine 4% Treatment Notes Electronic Signature(s) Signed: 06/19/2015 5:07:12 PM  By: Curtis Sites Entered By: Curtis Sites on 06/18/2015 11:23:37 Lascala, [name] ([id number]) -------------------------------------------------------------------------------- Multi-Disciplinary Care Plan Details Patient Name: FATEMA, RABE. Date of Service: 06/18/2015 11:15 AM Medical Record Number: [id number] Patient Account Number: [id number] Date of Birth/Sex: 07/11/1951 (64 y.o. Female) Treating RN: Curtis Sites Primary Care Physician: Aram Beecham Other Clinician: Referring Physician: Aram Beecham Treating Physician/Extender: BURNS III, Regis Bill in Treatment: 1 Active Inactive Wound/Skin Impairment Nursing Diagnoses: Impaired tissue integrity Goals: Patient/caregiver will verbalize understanding of skin care regimen Date Initiated: 06/11/2015 Goal Status: Active Ulcer/skin breakdown will have a volume reduction of 30% by week 4 Date Initiated: 06/11/2015 Goal Status: Active Ulcer/skin breakdown will have a volume reduction of 50% by week 8 Date Initiated: 06/11/2015 Goal Status: Active Ulcer/skin breakdown will have a volume reduction of 80% by week 12 Date Initiated: 06/11/2015 Goal Status: Active Ulcer/skin breakdown will heal within 14 weeks Date Initiated: 06/11/2015 Goal Status: Active Interventions: Assess patient/caregiver ability to obtain necessary supplies Assess ulceration(s) every visit Notes: Electronic Signature(s) Signed: 06/19/2015 5:07:12 PM By: Curtis Sites Entered By: Curtis Sites on 06/18/2015 11:23:27 Mccary, [name] ([id number]) -------------------------------------------------------------------------------- Patient/Caregiver Education Details Patient Name: [name]. Date of Service: 06/18/2015 11:15 AM Medical Record Number: [id number] Patient Account Number: [id number] Date of Birth/Gender: 1951-01-19 (64 y.o. Female) Treating RN: Curtis Sites Primary Care Physician: Aram Beecham Other Clinician: Referring  Physician: Aram Beecham Treating Physician/Extender: BURNS III, Regis Bill in Treatment: 1 Education Assessment Education Provided To: Patient Education Topics Provided Wound/Skin Impairment: Handouts: Other: wound care as ordered Methods: Demonstration, Explain/Verbal Responses: State content correctly Electronic Signature(s) Signed: 06/19/2015 5:07:12 PM By: Curtis Sites Entered By: Curtis Sites on 06/18/2015 11:43:11 Eichel, Angeleah K. ([id number]) -------------------------------------------------------------------------------- Wound Assessment Details Patient Name: [name] Date of Service: 06/18/2015 11:15 AM Medical Record Number: [id number] Patient Account Number: [id number] Date of Birth/Sex: [birth year] (64 y.o. Female) Treating RN: Curtis Sites Primary Care Physician: Aram Beecham Other Clinician: Referring Physician: Aram Beecham Treating Physician/Extender: BURNS III, Regis Bill in Treatment: 1 Wound Status Wound Number: 2 Primary Diabetic Wound/Ulcer of the Lower Etiology: Extremity Wound Location: Left Foot - Plantar Wound Open Wounding Event: Trauma Status: Date Acquired: 05/21/2015 Comorbid Cataracts, Angina, Arrhythmia, Weeks Of Treatment: 1 History: Coronary Artery Disease, Hypertension, Clustered Wound: No Peripheral Arterial Disease, Type II Diabetes Photos Photo Uploaded By: Curtis Sites on 06/18/2015 11:51:46 Wound Measurements Length: (cm) 0.2 Width: (cm) 0.2 Depth: (cm) 0.3 Area: (cm) 0.031 Volume: (cm) 0.009 % Reduction in Area: 50.8% % Reduction in Volume: 64% Epithelialization: None Tunneling: No Undermining: No Wound Description Classification: Grade 1 Wound Margin: Flat and Intact Exudate Amount: Medium Exudate Type: Serous Exudate Color: amber Foul Odor After Cleansing: No Wound Bed Granulation Amount: Large (67-100%) Exposed Structure Granulation Quality: Pink  Fascia Exposed: No Necrotic  Amount: None Present (0%) Fat Layer Exposed: No Aleshire, Shawnita K. ([id number]) Tendon Exposed: No Muscle Exposed: No Joint Exposed: No Bone Exposed: No Limited to Skin Breakdown Periwound Skin Texture Texture Color No Abnormalities Noted: No No Abnormalities Noted: No Callus: Yes Atrophie Blanche: No Crepitus: No Cyanosis: No Excoriation: No Ecchymosis: No Fluctuance: No Erythema: No Friable: No Hemosiderin Staining: No Induration: No Mottled: No Localized Edema: No Pallor: No Rash: No Rubor: No Scarring: No Temperature / Pain Moisture Temperature: No Abnormality No Abnormalities Noted: No Tenderness on Palpation: Yes Dry / Scaly: No Maceration: No Moist: Yes Wound Preparation Ulcer Cleansing: Rinsed/Irrigated with Saline Topical Anesthetic Applied: Other: lidocaine 4%, Treatment Notes Wound #2 (Left, Plantar Foot) 1. Cleansed with: Clean wound with Normal Saline 2. Anesthetic Topical Lidocaine 4% cream to wound bed prior to debridement 4. Dressing Applied: Aquacel Ag Other dressing (specify in notes) 5. Secondary Dressing Applied Dry Gauze 7. Secured with Tape Notes darco shoe with peg assist, comfeel plus Electronic Signature(s) Signed: 06/19/2015 5:07:12 PM By: Dyann Kief, [name] ([id number]) Entered By: Curtis Sites on 06/18/2015 11:21:00 Donner, [name] ([id number]) -------------------------------------------------------------------------------- Vitals Details Patient Name: [name] Date of Service: 06/18/2015 11:15 AM Medical Record Number: [id number] Patient Account Number: [id number] Date of Birth/Sex: 27-Nov-1950 (64 y.o. Female) Treating RN: Curtis Sites Primary Care Physician: Aram Beecham Other Clinician: Referring Physician: Aram Beecham Treating Physician/Extender: BURNS III, Regis Bill in Treatment: 1 Vital Signs Time Taken: 11:15 Temperature (F): 98.4 Height (in): 68 Pulse (bpm): 90 Weight  (lbs): 154 Respiratory Rate (breaths/min): 18 Body Mass Index (BMI): 23.4 Blood Pressure (mmHg): 156/63 Reference Range: 80 - 120 mg / dl Electronic Signature(s) Signed: 06/19/2015 5:07:12 PM By: Curtis Sites Entered By: Curtis Sites on 06/18/2015 11:16:11

## 2015-06-20 NOTE — Progress Notes (Signed)
AALEIGHA, BOZZA ([id number]) Visit Report for 06/18/2015 Chief Complaint Document Details Patient Name: [name], [name]. Date of Service: 06/18/2015 11:15 AM Medical Record Patient Account Number: [id number] Number: Treating RN: Curtis Sites [birth year] (64 y.o. 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 Electronic Signature(s) Signed: 06/18/2015 4:26:05 PM By: Madelaine Bhat MD Entered By: Madelaine Bhat on 06/18/2015 11:44:15 [name], [name] Kitchen ([id number]) -------------------------------------------------------------------------------- Debridement Details Patient Name: [name] K. Date of Service: 06/18/2015 11:15 AM Medical Record Patient Account Number: [id number] Number: Treating RN: Curtis Sites 10-15-1960 (64 y.o. Other Clinician: Date of Birth/Sex: Female) Treating BURNS III, Primary Care Physician: Aram Beecham Physician/Extender: Zollie Beckers Referring Physician: Bing Plume in Treatment: 1 Debridement Performed for Wound #2 Left,Plantar Foot Assessment: Performed By: Physician BURNS III, Melanie Crazier., MD Debridement: Debridement Pre-procedure Yes Verification/Time Out Taken: Start Time: 11:29 Pain Control: Lidocaine 4% Topical Solution Level: Skin/Subcutaneous Tissue Total Area Debrided (L x 0.2 (cm) x 0.3 (cm) = 0.06 (cm) W): Tissue and other Viable, Non-Viable, Callus, Fat, Fibrin/Slough, Subcutaneous material debrided: Instrument: Blade Bleeding: Minimum Hemostasis Achieved: Pressure End Time: 11:32 Procedural Pain: 0 Post Procedural Pain: 0 Response to Treatment: Procedure was tolerated well Post Debridement Measurements of Total Wound Length: (cm)  0.2 Width: (cm) 0.3 Depth: (cm) 0.3 Volume: (cm) 0.014 Post Procedure Diagnosis Same as Pre-procedure Electronic Signature(s) Signed: 06/18/2015 4:26:05 PM By: Madelaine Bhat MD Signed: 06/19/2015 5:07:12 PM By: Curtis Sites Entered By: Madelaine Bhat on 06/18/2015 11:44:04 [name], [name] ([id number]) -------------------------------------------------------------------------------- HPI Details Patient Name: [name] K. Date of Service: 06/18/2015 11:15 AM Medical Record Patient Account Number: [id number] Number: Treating RN: Curtis Sites [birth year] (64 y.o. Other Clinician: Date of Birth/Sex: Female) Treating BURNS III, Primary Care Physician: Aram Beecham Physician/Extender: Zollie Beckers Referring Physician: Bing Plume in Treatment: 1 History of Present Illness HPI Description: Very pleasant 64 year old with past medical history significant for type 2 diabetes (hemoglobin A1c 7.3 in June 2016), peripheral neuropathy, peripheral vascular disease, and coronary artery disease. 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 She developed an ulceration in between her left third and fourth metatarsal heads in April 2014 after stepping on a piece of glass. She underwent LLE angioplasty in June 2014 by Dr. [name]. She was treated at the [facility] wound clinic with hyperbaric oxygen therapy for a deep tissue infection, which grew MRSA and did not significantly improve with antibiotics. X-rays showed no evidence for underlying osteomyelitis. She also underwent placement of Dermagraft x7. She completed a total of 60 HBO treatments at [facility] in Feb 2015. In-chamber TCOM >772mmHg. Her ulcer worsened after stopping HBO and she completed an additional 30 treatments (90 total) with significant improvement (eventually healed). She underwent angioplasty of her left peroneal artery,  tibioperoneal trunk, and superficial femoral artery on September 26, 2013. MRI on 10/12/13 showed no evidence for osteomyelitis or abscess. s/p Grafix x 5 and Epifix x 3 application with significant improvement. Significantly improved after starting to offload with knee walker, which she used at home but not in public for fear of falling. Also using darco shoe. Refused total contact cast secondary to fall history. Her ulcer was healed at her last clinic visit in Feb 2016. She return to clinic after  stepping on a piece of plastic in October 2016. She developed a painful callus and was seen by Dr. Alberteen Spindleline in podiatry. The callus was debrided, and she was found to have an underlying ulceration. X-ray reportedly showed no evidence for underlying osteomyelitis. Records requested. She reports moderate pain with pressure. No claudication or rest pain. ABI today 0.8. She has been applying silver alginate and offloading with a postop shoe. No fever or chills. Minimal bloody drainage. Electronic Signature(s) Signed: 06/18/2015 4:26:05 PM By: Madelaine BhatBurns, III, Joey Lierman MD Entered By: Madelaine BhatBurns, III, Cavon Nicolls on 06/18/2015 11:46:18 [name], [name] ([id number]) -------------------------------------------------------------------------------- Physical Exam Details Patient Name: Dominique MarkerOTEAT, Namrata K. Date of Service: 06/18/2015 11:15 AM Medical Record Patient Account Number: [id number] Number: Treating RN: Curtis SitesDorthy, Joanna 07-31-1961 (64 y.o. Other Clinician: Date of Birth/Sex: Female) Treating BURNS III, Primary Care Physician: Aram BeechamSparks, Jeffrey Physician/Extender: Zollie BeckersWALTER Referring Physician: Bing PlumeSparks, Jeffrey Weeks in Treatment: 1 Constitutional . Pulse regular. Respirations normal and unlabored. Afebrile. . Notes Left third metatarsal head plantar ulceration improved. Moderate surrounding callus. Full-thickness. No exposed deep structures. No probe to bone but close. No cellulitis. No palpable pedal pulses per  her baseline. Dopplerable DP and PT. Left ABI 0.8. Electronic Signature(s) Signed: 06/18/2015 4:26:05 PM By: Madelaine BhatBurns, III, Arrabella Westerman MD Entered By: Madelaine BhatBurns, III, Delmont Prosch on 06/18/2015 11:47:40 [name], [name] ([id number]) -------------------------------------------------------------------------------- Physician Orders Details Patient Name: Dominique MaffucciPOTEAT, [name] Date of Service: 06/18/2015 11:15 AM Medical Record Patient Account Number: [id number] Number: Treating RN: Curtis SitesDorthy, Joanna 07-31-1961 (64 y.o. Other Clinician: Date of Birth/Sex: Female) Treating BURNS III, Primary Care Physician: Aram BeechamSparks, Jeffrey Physician/Extender: Zollie BeckersWALTER Referring Physician: Bing PlumeSparks, Jeffrey Weeks in Treatment: 1 Verbal / Phone Orders: Yes Clinician: Curtis Sitesorthy, Joanna Read Back and Verified: Yes Diagnosis Coding Wound Cleansing Wound #2 Left,Plantar Foot o Clean wound with Normal Saline. Anesthetic Wound #2 Left,Plantar Foot o Topical Lidocaine 4% cream applied to wound bed prior to debridement Primary Wound Dressing Wound #2 Left,Plantar Foot o Aquacel Ag Secondary Dressing Wound #2 Left,Plantar Foot o Dry Gauze Dressing Change Frequency Wound #2 Left,Plantar Foot o Change dressing every other day. Follow-up Appointments Wound #2 Left,Plantar Foot o Return Appointment in 1 week. Off-Loading Wound #2 Left,Plantar Foot o Open toe surgical shoe with peg assist. o Other: - Comfeel Plus Electronic Signature(s) Signed: 06/18/2015 4:26:05 PM By: Madelaine BhatBurns, III, Shivam Mestas MD Signed: 06/19/2015 5:07:12 PM By: Dyann Kieforthy, Joanna Bellantoni, [name] ([id number]) Entered By: Curtis Sitesorthy, Joanna on 06/18/2015 11:34:28 Hashem, Shirelle Kirtland BouchardK. ([id number]) -------------------------------------------------------------------------------- Problem List Details Patient Name: Dominique MarkerOTEAT, Jode K. Date of Service: 06/18/2015 11:15 AM Medical Record Patient Account Number: [id number] Number: Treating RN: Curtis SitesDorthy,  Joanna 07-31-1961 (64 y.o. Other Clinician: Date of Birth/Sex: Female) Treating BURNS III, Primary Care Physician: Aram BeechamSparks, Jeffrey Physician/Extender: Zollie BeckersWALTER Referring Physician: Bing PlumeSparks, Jeffrey Weeks in Treatment: 1 Active Problems ICD-10 Encounter Code Description Active Date Diagnosis E11.621 Type 2 diabetes mellitus with foot ulcer 06/11/2015 Yes E11.40 Type 2 diabetes mellitus with diabetic neuropathy, 06/11/2015 Yes unspecified I70.245 Atherosclerosis of native arteries of left leg with ulceration 06/11/2015 Yes of other part of foot I25.10 Atherosclerotic heart disease of native coronary artery 06/11/2015 Yes without angina pectoris Inactive Problems Resolved Problems Electronic Signature(s) Signed: 06/18/2015 4:26:05 PM By: Madelaine BhatBurns, III, Waldine Zenz MD Entered By: Madelaine BhatBurns, III, Kamil Mchaffie on 06/18/2015 11:43:27 [place], Asees K. ([id number]) -------------------------------------------------------------------------------- Progress Note Details Patient Name: [name], [name] Date of Service: 06/18/2015 11:15 AM Medical Record Patient Account Number: [id number] Number: Treating RN: Curtis SitesDorthy, Joanna 07-31-1961 (64 y.o. 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 History of Present Illness (HPI) Very pleasant 64 year old with past medical history significant for type 2 diabetes (hemoglobin A1c 7.3 in June 2016), peripheral neuropathy, peripheral vascular disease, and coronary artery disease. 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 She developed an ulceration in between her left third and fourth metatarsal heads in April 2014  after stepping on a piece of glass. She underwent LLE angioplasty in June 2014 by Dr. [name]. She was treated at the [facility] wound clinic with hyperbaric oxygen therapy for a deep tissue infection, which grew MRSA and did not significantly improve with antibiotics. X-rays showed no evidence for underlying osteomyelitis. She also underwent placement of Dermagraft x7. She completed a total of 60 HBO treatments at [facility] in Feb 2015. In-chamber TCOM >752mmHg. Her ulcer worsened after stopping HBO and she completed an additional 30 treatments (90 total) with significant improvement (eventually healed). She underwent angioplasty of her left peroneal artery, tibioperoneal trunk, and superficial femoral artery on September 26, 2013. MRI on 10/12/13 showed no evidence for osteomyelitis or abscess. s/p Grafix x 5 and Epifix x 3 application with significant improvement. Significantly improved after starting to offload with knee walker, which she used at home but not in public for fear of falling. Also using darco shoe. Refused total contact cast secondary to fall history. Her ulcer was healed at her last clinic visit in Feb 2016. She return to clinic after stepping on a piece of plastic in October 2016. She developed a painful callus and was seen by Dr. Alberteen Spindle in podiatry. The callus was debrided, and she was found to have an underlying ulceration. X-ray reportedly showed no evidence for underlying osteomyelitis. Records requested. She reports moderate pain with pressure. No claudication or rest pain. ABI today 0.8. She has been applying silver alginate and offloading with a postop shoe. No fever or chills. Minimal bloody drainage. Objective [name], [name] ([id number]) Constitutional Pulse regular. Respirations normal and unlabored. Afebrile. Vitals Time Taken: 11:15 AM, Height: 68 in, Weight: 154 lbs, BMI: 23.4, Temperature: 98.4 F, Pulse: 90 bpm, Respiratory Rate: 18 breaths/min, Blood  Pressure: 156/63 mmHg. General Notes: Left third metatarsal head plantar ulceration improved. Moderate surrounding callus. Full- thickness. No exposed deep structures. No probe to bone but close. No cellulitis. No palpable pedal pulses per her baseline. Dopplerable DP and PT. Left ABI 0.8. Integumentary (Hair, Skin) Wound #2 status is Open. Original cause of wound was Trauma. The wound is located on the Left,Plantar Foot. The wound measures 0.2cm length x 0.2cm width x 0.3cm depth; 0.031cm^2 area and 0.009cm^3 volume. The wound is limited to skin breakdown. There is no tunneling or undermining noted. There is a medium amount of serous drainage noted. The wound margin is flat and intact. There is large (67-100%) pink granulation within the wound bed. There is no necrotic tissue within the wound bed. The periwound skin appearance exhibited: Callus, Moist. The periwound skin appearance did not exhibit: Crepitus, Excoriation, Fluctuance, Friable, Induration, Localized Edema, Rash, Scarring, Dry/Scaly, Maceration, Atrophie Blanche, Cyanosis, Ecchymosis, Hemosiderin Staining, Mottled, Pallor, Rubor, Erythema. Periwound temperature was noted as No Abnormality. The periwound has tenderness on palpation. 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 Arterial insufficiency. Procedures Wound #2 Wound #2 is a Diabetic Wound/Ulcer of the Lower Extremity located on the Left,Plantar Foot . There was a Skin/Subcutaneous Tissue Debridement ([id number]) debridement with total area of 0.06 sq cm [name], [name] ([id number]) performed by  BURNS III, Melanie Crazier., MD. with the following instrument(s): Blade to remove Viable and Non-Viable tissue/material including Fat, Fibrin/Slough, Callus, and Subcutaneous after achieving pain control using Lidocaine 4% Topical Solution. A time out was conducted prior to the start of the procedure. A Minimum amount of bleeding was controlled with Pressure. The procedure was tolerated well with a pain level of 0 throughout and a pain level of 0 following the procedure. Post Debridement Measurements: 0.2cm length x 0.3cm width x 0.3cm depth; 0.014cm^3 volume. Post procedure Diagnosis Wound #2: Same as Pre-Procedure Plan Wound Cleansing: Wound #2 Left,Plantar Foot: Clean wound with Normal Saline. Anesthetic: Wound #2 Left,Plantar Foot: Topical Lidocaine 4% cream applied to wound bed prior to debridement Primary Wound Dressing: Wound #2 Left,Plantar Foot: Aquacel Ag Secondary Dressing: Wound #2 Left,Plantar Foot: Dry Gauze Dressing Change Frequency: Wound #2 Left,Plantar Foot: Change dressing every other day. Follow-up Appointments: Wound #2 Left,Plantar Foot: Return Appointment in 1 week. Off-Loading: Wound #2 Left,Plantar Foot: Open toe surgical shoe with peg assist. Other: - Comfeel Plus Silver alginate. Offloading with postop shoe. Again recommended knee walker. Vascular surgery follow-up. [name], [name] ([id number]) Obtain x-ray results from podiatry office. Electronic Signature(s) Signed: 06/18/2015 4:26:05 PM By: Madelaine Bhat MD Entered By: Madelaine Bhat on 06/18/2015 11:48:43 Kall, Tametra Kirtland Bouchard ([id number]) -------------------------------------------------------------------------------- SuperBill Details Patient Name: Dominique Maffucci. Date of Service: 06/18/2015 Medical Record Patient Account Number: [id number] Number: Treating RN: Curtis Sites 09-Apr-1951 (64 y.o. 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 of other part of foot I25.10 Atherosclerotic heart disease of native coronary artery without angina pectoris Facility Procedures CPT4 Code: [id number] Description: 11042 - DEB SUBQ TISSUE 20 SQ CM/< ICD-10 Description Diagnosis E11.621 Type 2 diabetes mellitus with foot ulcer Modifier: Quantity: 1 Physician Procedures CPT4 Code: 2440102 Description: 11042 - WC PHYS SUBQ TISS 20 SQ CM ICD-10 Description Diagnosis E11.621 Type 2 diabetes mellitus with foot ulcer Modifier: Quantity: 1 Electronic Signature(s) Signed: 06/18/2015 4:26:05 PM By: Madelaine Bhat MD Entered By: Madelaine Bhat on 06/18/2015 11:48:54

## 2015-06-25 ENCOUNTER — Encounter: Payer: Medicare Other | Admitting: Surgery

## 2015-06-25 DIAGNOSIS — E11621 Type 2 diabetes mellitus with foot ulcer: Secondary | ICD-10-CM | POA: Diagnosis not present

## 2015-06-26 NOTE — Progress Notes (Signed)
Dominique, [name] ([id number]) Visit Report for 06/25/2015 Chief Complaint Document Details Patient Name: [name], [name] 06/25/2015 10:45 Date of Service: AM Medical Record [id number] Number: Patient Account Number: [id number] 03-24-1951 (64 y.o. 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 Electronic Signature(s) Signed: 06/25/2015 4:30:30 PM By: Madelaine Bhat MD Entered By: Madelaine Bhat on 06/25/2015 11:34:21 Jeansonne, [name] ([id number]) -------------------------------------------------------------------------------- Debridement Details Patient Name: [name], [name] 06/25/2015 10:45 Date of Service: AM Medical Record [id number] Number: Patient Account Number: [id number] 10/04/1950 (64 y.o. Treating RN: Curtis Sites Date of Birth/Sex: Female) Other Clinician: Primary Care Physician: Aram Beecham Treating BURNS III, WALTER Referring Physician: Aram Beecham Physician/Extender: Weeks in Treatment: 2 Debridement Performed for Wound #2 Left,Plantar Foot Assessment: Performed By: Physician BURNS III, Melanie Crazier., MD Debridement: Debridement Pre-procedure Yes Verification/Time Out Taken: Start Time: 11:10 Pain Control: Lidocaine 4% Topical Solution Level: Skin/Subcutaneous Tissue Total Area Debrided (L x 0.2 (cm) x 0.2 (cm) = 0.04 (cm) W): Tissue and other Viable, Non-Viable, Callus, Fat, Fibrin/Slough, Subcutaneous material debrided: Instrument: Blade, Curette Bleeding: Minimum Hemostasis Achieved: Pressure End Time: 11:14 Procedural Pain: 0 Post Procedural Pain: 0 Response to Treatment: Procedure was tolerated well Post Debridement Measurements of Total  Wound Length: (cm) 0.2 Width: (cm) 0.2 Depth: (cm) 0.3 Volume: (cm) 0.009 Post Procedure Diagnosis Same as Pre-procedure Electronic Signature(s) Signed: 06/25/2015 4:30:30 PM By: Madelaine Bhat MD Signed: 06/25/2015 5:05:32 PM By: Curtis Sites Entered By: Madelaine Bhat on 06/25/2015 11:34:09 Stucki, [name] ([id number]) -------------------------------------------------------------------------------- HPI Details Patient Name: [name], [name] 06/25/2015 10:45 Date of Service: AM Medical Record [id number] Number: Patient Account Number: [id number] Jan 02, 1951 (64 y.o. Treating RN: Curtis Sites Date of Birth/Sex: Female) Other Clinician: Primary Care Physician: Aram Beecham Treating BURNS III, WALTER Referring Physician: Aram Beecham Physician/Extender: Weeks in Treatment: 2 History of Present Illness HPI Description: Very pleasant 64 year old with past medical history significant for type 2 diabetes (hemoglobin A1c 7.3 in June 2016), peripheral neuropathy, peripheral vascular disease, and coronary artery disease. 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 She developed an ulceration in between her left third and fourth metatarsal heads in April 2014 after stepping on a piece of glass. She underwent LLE angioplasty in June 2014 by Dr. [name]. She was treated at the [facility] wound clinic with hyperbaric oxygen therapy for a deep tissue infection, which grew MRSA and did not significantly improve with antibiotics. X-rays showed no evidence for underlying osteomyelitis. She also underwent placement of Dermagraft x7. She completed a total of 60 HBO treatments at [facility] in Feb 2015. In-chamber TCOM >768mmHg. Her ulcer worsened after stopping HBO and she completed an additional 30 treatments (90 total) with significant improvement (eventually healed). She underwent angioplasty of her  left peroneal artery, tibioperoneal trunk, and superficial femoral artery on September 26, 2013. MRI on 10/12/13 showed no evidence for osteomyelitis or abscess. s/p Grafix x 5 and Epifix x 3 application with significant improvement. Significantly improved after starting to offload with knee walker, which she used at home but not in public for fear of falling. Also using darco shoe. Refused total contact cast secondary to fall history. Her ulcer was healed at her last clinic visit in Feb 2016. She returned to clinic  after stepping on a piece of plastic in October 2016. She developed a painful callus and was seen by Dr. Alberteen Spindle in podiatry. The callus was debrided, and she was found to have an underlying ulceration. X-ray reportedly showed no evidence for underlying osteomyelitis. Records requested. She reports moderate pain with pressure. No claudication or rest pain. ABI 0.8. She has been applying silver alginate and offloading with a postop shoe. No fever or chills. Minimal bloody drainage. Electronic Signature(s) Signed: 06/25/2015 4:30:30 PM By: Madelaine Bhat MD Entered By: Madelaine Bhat on 06/25/2015 11:35:01 HARLOW, BASLEY ([id number]) -------------------------------------------------------------------------------- Physical Exam Details Patient Name: [name], [name] 06/25/2015 10:45 Date of Service: AM Medical Record [id number] Number: Patient Account Number: [id number] 1951/04/08 (64 y.o. Treating RN: Curtis Sites Date of Birth/Sex: Female) Other Clinician: Primary Care Physician: Aram Beecham Treating BURNS III, WALTER Referring Physician: Aram Beecham Physician/Extender: Weeks in Treatment: 2 Constitutional . Pulse regular. Respirations normal and unlabored. Afebrile. . Notes Left third metatarsal head plantar ulceration improved. Moderate surrounding callus. Full-thickness. No exposed deep structures. No probe to bone but close. No cellulitis. No palpable  pedal pulses per her baseline. Dopplerable DP and PT. Left ABI 0.8. Electronic Signature(s) Signed: 06/25/2015 4:30:30 PM By: Madelaine Bhat MD Entered By: Madelaine Bhat on 06/25/2015 11:35:33 Broussard, [name] ([id number]) -------------------------------------------------------------------------------- Physician Orders Details Patient Name: GERICA, KOBLE 06/25/2015 10:45 Date of Service: AM Medical Record [id number] Number: Patient Account Number: [id number] 05-21-1951 (64 y.o. Treating RN: Curtis Sites Date of Birth/Sex: Female) Other Clinician: Primary Care Physician: Aram Beecham Treating BURNS III, WALTER Referring Physician: Aram Beecham Physician/Extender: Weeks in Treatment: 2 Verbal / Phone Orders: Yes Clinician: Curtis Sites Read Back and Verified: Yes Diagnosis Coding Wound Cleansing Wound #2 Left,Plantar Foot o Clean wound with Normal Saline. Anesthetic Wound #2 Left,Plantar Foot o Topical Lidocaine 4% cream applied to wound bed prior to debridement Primary Wound Dressing Wound #2 Left,Plantar Foot o Prisma Ag Secondary Dressing Wound #2 Left,Plantar Foot o Dry Gauze Dressing Change Frequency Wound #2 Left,Plantar Foot o Change dressing every other day. Follow-up Appointments Wound #2 Left,Plantar Foot o Return Appointment in 1 week. Off-Loading Wound #2 Left,Plantar Foot o Open toe surgical shoe with peg assist. o Other: - Comfeel Plus Electronic Signature(s) Signed: 06/25/2015 4:30:30 PM By: Madelaine Bhat MD Signed: 06/25/2015 5:05:32 PM By: Dyann Kief, [name] ([id number]) Entered By: Curtis Sites on 06/25/2015 11:16:02 QUINCEE, GITTENS ([id number]) -------------------------------------------------------------------------------- Problem List Details Patient Name: [name], [name] 06/25/2015 10:45 Date of Service: AM Medical Record [id number] Number: Patient Account Number:  [id number] 1951/02/07 (64 y.o. Treating RN: Curtis Sites Date of Birth/Sex: Female) Other Clinician: Primary Care Physician: Aram Beecham Treating BURNS III, Zollie Beckers Referring Physician: Aram Beecham Physician/Extender: Weeks in Treatment: 2 Active Problems ICD-10 Encounter Code Description Active Date Diagnosis E11.621 Type 2 diabetes mellitus with foot ulcer 06/11/2015 Yes E11.40 Type 2 diabetes mellitus with diabetic neuropathy, 06/11/2015 Yes unspecified I70.245 Atherosclerosis of native arteries of left leg with ulceration 06/11/2015 Yes of other part of foot I25.10 Atherosclerotic heart disease of native coronary artery 06/11/2015 Yes without angina pectoris Inactive Problems Resolved Problems Electronic Signature(s) Signed: 06/25/2015 4:30:30 PM By: Madelaine Bhat MD Entered By: Madelaine Bhat on 06/25/2015 11:33:41 Grantham, Alleah Kirtland Bouchard ([id number]) -------------------------------------------------------------------------------- Progress Note Details Patient Name: [name], [name] 06/25/2015 10:45 Date of Service: AM Medical Record [id number] Number: Patient Account Number: [id number] March 01, 1951 (64 y.o. 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 History of Present Illness (HPI) Very pleasant 64 year old with past medical history significant for type 2 diabetes (hemoglobin A1c 7.3 in June 2016), peripheral neuropathy, peripheral vascular disease, and coronary artery disease. 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 She developed an ulceration in between her left third and fourth  metatarsal heads in April 2014 after stepping on a piece of glass. She underwent LLE angioplasty in June 2014 by Dr. Gilda CreaseSchnier. She was [facility] at [facility] wound clinic with hyperbaric oxygen therapy for a deep tissue infection, which grew MRSA and did not significantly improve with antibiotics. X-rays showed no evidence for underlying osteomyelitis. She also underwent placement of Dermagraft x7. She completed a total of 60 HBO treatments at [facility] in Feb 2015. In-chamber TCOM >75200mmHg. Her ulcer worsened after stopping HBO and she completed an additional 30 treatments (90 total) with significant improvement (eventually healed). She underwent angioplasty of her left peroneal artery, tibioperoneal trunk, and superficial femoral artery on September 26, 2013. MRI on 10/12/13 showed no evidence for osteomyelitis or abscess. s/p Grafix x 5 and Epifix x 3 application with significant improvement. Significantly improved after starting to offload with knee walker, which she used at home but not in public for fear of falling. Also using darco shoe. Refused total contact cast secondary to fall history. Her ulcer was healed at her last clinic visit in Feb 2016. She returned to clinic after stepping on a piece of plastic in October 2016. She developed a painful callus and was seen by Dr. Alberteen Spindleline in podiatry. The callus was debrided, and she was found to have an underlying ulceration. X-ray reportedly showed no evidence for underlying osteomyelitis. Records requested. She reports moderate pain with pressure. No claudication or rest pain. ABI 0.8. She has been applying silver alginate and offloading with a postop shoe. No fever or chills. Minimal bloody drainage. Objective Sedlak, Briasia K. ([id number]) Constitutional Pulse regular. Respirations normal and unlabored. Afebrile. Vitals Time Taken: 11:03 AM, Height: 68 in, Weight: 154 lbs, BMI: 23.4, Temperature: 98.2 F, Pulse: 64 bpm, Respiratory Rate: 18  breaths/min, Blood Pressure: 156/58 mmHg. General Notes: Left third metatarsal head plantar ulceration improved. Moderate surrounding callus. Full- thickness. No exposed deep structures. No probe to bone but close. No cellulitis. No palpable pedal pulses per her baseline. Dopplerable DP and PT. Left ABI 0.8. Integumentary (Hair, Skin) Wound #2 status is Open. Original cause of wound was Trauma. The wound is located on the Left,Plantar Foot. The wound measures 0.2cm length x 0.1cm width x 0.2cm depth; 0.016cm^2 area and 0.003cm^3 volume. The wound is limited to skin breakdown. There is no tunneling or undermining noted. There is a medium amount of serous drainage noted. The wound margin is flat and intact. There is large (67-100%) pink granulation within the wound bed. There is no necrotic tissue within the wound bed. The periwound skin appearance exhibited: Callus, Moist. The periwound skin appearance did not exhibit: Crepitus, Excoriation, Fluctuance, Friable, Induration, Localized Edema, Rash, Scarring, Dry/Scaly, Maceration, Atrophie Blanche, Cyanosis, Ecchymosis, Hemosiderin Staining, Mottled, Pallor, Rubor, Erythema. Periwound temperature was noted as No Abnormality. The periwound has tenderness on palpation. Assessment Active Problems ICD-10 E11.621 - Type 2 diabetes mellitus with foot ulcer E11.40 - Type 2 diabetes mellitus with diabetic  neuropathy, unspecified I70.245 - Atherosclerosis of native arteries of left leg with ulceration of other part of foot I25.10 - Atherosclerotic heart disease of native coronary artery without angina pectoris Recurrent left third/fourth metatarsal head ulceration, Wagner grade 1. Procedures Wound #2 Wound #2 is a Diabetic Wound/Ulcer of the Lower Extremity located on the Left,Plantar Foot . There was a Skin/Subcutaneous Tissue Debridement ([id number]) debridement with total area of 0.04 sq cm Semper, Edythe K. ([id number]) performed by BURNS III,  Melanie Crazier., MD. with the following instrument(s): Blade and Curette to remove Viable and Non-Viable tissue/material including Fat, Fibrin/Slough, Callus, and Subcutaneous after achieving pain control using Lidocaine 4% Topical Solution. A time out was conducted prior to the start of the procedure. A Minimum amount of bleeding was controlled with Pressure. The procedure was tolerated well with a pain level of 0 throughout and a pain level of 0 following the procedure. Post Debridement Measurements: 0.2cm length x 0.2cm width x 0.3cm depth; 0.009cm^3 volume. Post procedure Diagnosis Wound #2: Same as Pre-Procedure Plan Wound Cleansing: Wound #2 Left,Plantar Foot: Clean wound with Normal Saline. Anesthetic: Wound #2 Left,Plantar Foot: Topical Lidocaine 4% cream applied to wound bed prior to debridement Primary Wound Dressing: Wound #2 Left,Plantar Foot: Prisma Ag Secondary Dressing: Wound #2 Left,Plantar Foot: Dry Gauze Dressing Change Frequency: Wound #2 Left,Plantar Foot: Change dressing every other day. Follow-up Appointments: Wound #2 Left,Plantar Foot: Return Appointment in 1 week. Off-Loading: Wound #2 Left,Plantar Foot: Open toe surgical shoe with peg assist. Other: - Comfeel Plus Prisma. Offloading with knee walker at home and postop shoe otherwise. Awaiting custom fit orthotic. Electronic Signature(s) Signed: 06/25/2015 4:30:30 PM By: Madelaine Bhat MD KEORA, ECCLESTON ([id number]) Entered By: Madelaine Bhat on 06/25/2015 11:36:37 Leisure, [name] ([id number]) -------------------------------------------------------------------------------- SuperBill Details Patient Name: [name]. Date of Service: 06/25/2015 Medical Record Patient Account Number: [id number] Number: Treating RN: Curtis Sites 12/30/1950 (64 y.o. Other Clinician: Date of Birth/Sex: Female) Treating BURNS III, Primary Care Physician: Aram Beecham Physician/Extender:  Zollie Beckers Referring Physician: Bing Plume in Treatment: 2 Diagnosis Coding ICD-10 Codes Code Description E11.621 Type 2 diabetes mellitus with foot ulcer E11.40 Type 2 diabetes mellitus with diabetic neuropathy, unspecified I70.245 Atherosclerosis of native arteries of left leg with ulceration of other part of foot I25.10 Atherosclerotic heart disease of native coronary artery without angina pectoris Facility Procedures CPT4 Code: [id number] Description: 11042 - DEB SUBQ TISSUE 20 SQ CM/< ICD-10 Description Diagnosis E11.621 Type 2 diabetes mellitus with foot ulcer Modifier: Quantity: 1 Physician Procedures CPT4 Code: [id number] Description: 11042 - WC PHYS SUBQ TISS 20 SQ CM ICD-10 Description Diagnosis E11.621 Type 2 diabetes mellitus with foot ulcer Modifier: Quantity: 1 Electronic Signature(s) Signed: 06/25/2015 4:30:30 PM By: Madelaine Bhat MD Entered By: Madelaine Bhat on 06/25/2015 11:36:48

## 2015-06-26 NOTE — Progress Notes (Signed)
[name], Bedelia K. ([id number]) Visit Report for 06/25/2015 Arrival Information Details Patient Name: [name], [name] Date of Service: 06/25/2015 10:45 AM Medical Record Number: [id number] Patient Account Number: [id number] Date of Birth/Sex: 10-03-1950 (64 y.o. Female) Treating RN: Curtis Sitesorthy, Joanna Primary Care Physician: Aram BeechamSparks, Jeffrey Other Clinician: Referring Physician: Aram BeechamSparks, Jeffrey Treating Physician/Extender: BURNS III, Regis BillWALTER Weeks in Treatment: 2 Visit Information History Since Last Visit Added or deleted any medications: No Patient Arrived: Ambulatory Any new allergies or adverse reactions: No Arrival Time: 10:55 Had a fall or experienced change in No Accompanied By: self activities of daily living that may affect Transfer Assistance: None risk of falls: Patient Identification Verified: Yes Signs or symptoms of abuse/neglect since last No Secondary Verification Process Yes visito Completed: Hospitalized since last visit: No Patient Has Alerts: Yes Pain Present Now: No Patient Alerts: Patient on Blood Thinner DMII warfarin Electronic Signature(s) Signed: 06/25/2015 5:05:32 PM By: Curtis Sitesorthy, Joanna Entered By: Curtis Sitesorthy, Joanna on 06/25/2015 10:56:45 [name], [name] ([id number]) -------------------------------------------------------------------------------- Encounter Discharge Information Details Patient Name: [name], [name] Date of Service: 06/25/2015 10:45 AM Medical Record Number: [id number] Patient Account Number: [id number] Date of Birth/Sex: 10-03-1950 (64 y.o. Female) Treating RN: Curtis Sitesorthy, Joanna Primary Care Physician: Aram BeechamSparks, Jeffrey Other Clinician: Referring Physician: Aram BeechamSparks, Jeffrey Treating Physician/Extender: BURNS III, Regis BillWALTER Weeks in Treatment: 2 Encounter Discharge Information Items Discharge Pain Level: 0 Discharge Condition: Stable Ambulatory Status: Ambulatory Discharge Destination: Home Private Transportation: Auto Accompanied By:  self Schedule Follow-up Appointment: Yes Medication Reconciliation completed and No provided to Patient/Care [name]: Clinical Summary of Care: Electronic Signature(s) Signed: 06/25/2015 5:05:32 PM By: Curtis Sitesorthy, Joanna Entered By: Curtis Sitesorthy, Joanna on 06/25/2015 11:25:34 [name], [name] ([id number]) -------------------------------------------------------------------------------- Lower Extremity Assessment Details Patient Name: [name], [name] Date of Service: 06/25/2015 10:45 AM Medical Record Number: [id number] Patient Account Number: [id number] Date of Birth/Sex: 10-03-1950 (64 y.o. Female) Treating RN: Curtis Sitesorthy, Joanna Primary Care Physician: Aram BeechamSparks, Jeffrey Other Clinician: Referring Physician: Aram BeechamSparks, Jeffrey Treating Physician/Extender: BURNS III, Regis BillWALTER Weeks in Treatment: 2 Vascular Assessment Pulses: Posterior Tibial Dorsalis Pedis Palpable: [Left:Yes] Extremity colors, hair growth, and conditions: Extremity Color: [Left:Normal] Hair Growth on Extremity: [Left:No] Temperature of Extremity: [Left:Warm] Capillary Refill: [Left:< 3 seconds] Toe Nail Assessment Left: Right: Thick: No Discolored: No Deformed: No Improper Length and Hygiene: No Electronic Signature(s) Signed: 06/25/2015 5:05:32 PM By: Curtis Sitesorthy, Joanna Entered By: Curtis Sitesorthy, Joanna on 06/25/2015 11:02:33 [name], [name] ([id number]) -------------------------------------------------------------------------------- Multi Wound Chart Details Patient Name: [name], [name] Date of Service: 06/25/2015 10:45 AM Medical Record Number: [id number] Patient Account Number: [id number] Date of Birth/Sex: 10-03-1950 (64 y.o. Female) Treating RN: Curtis Sitesorthy, Joanna Primary Care Physician: Aram BeechamSparks, Jeffrey Other Clinician: Referring Physician: Aram BeechamSparks, Jeffrey Treating Physician/Extender: BURNS III, Regis BillWALTER Weeks in Treatment: 2 Vital Signs Height(in): 68 Pulse(bpm): 64 Weight(lbs): 154 Blood Pressure 156/58 (mmHg): Body  Mass Index(BMI): 23 Temperature(F): 98.2 Respiratory Rate 18 (breaths/min): Photos: [2:No Photos] [N/A:N/A] Wound Location: [2:Left Foot - Plantar] [N/A:N/A] Wounding Event: [2:Trauma] [N/A:N/A] Primary Etiology: [2:Diabetic Wound/Ulcer of the Lower Extremity] [N/A:N/A] Comorbid History: [2:Cataracts, Angina, Arrhythmia, Coronary Artery Disease, Hypertension, Peripheral Arterial Disease, Type II Diabetes] [N/A:N/A] Date Acquired: [2:05/21/2015] [N/A:N/A] Weeks of Treatment: [2:2] [N/A:N/A] Wound Status: [2:Open] [N/A:N/A] Measurements L x W x D 0.2x0.1x0.2 [N/A:N/A] (cm) Area (cm) : [2:0.016] [N/A:N/A] Volume (cm) : [2:0.003] [N/A:N/A] % Reduction in Area: [2:74.60%] [N/A:N/A] % Reduction in Volume: 88.00% [N/A:N/A] Classification: [2:Grade 1] [N/A:N/A] Exudate Amount: [2:Medium] [N/A:N/A] Exudate Type: [2:Serous] [N/A:N/A] Exudate Color: [2:amber] [N/A:N/A] Wound Margin: [2:Flat and Intact] [N/A:N/A] Granulation Amount: [2:Large (67-100%)] [N/A:N/A] Granulation Quality: [2:Pink] [  N/A:N/A] Necrotic Amount: [2:None Present (0%)] [N/A:N/A] Exposed Structures: [2:Fascia: No Fat: No] [N/A:N/A] Tendon: No Muscle: No Joint: No Bone: No Limited to Skin Breakdown Epithelialization: None N/A N/A Periwound Skin Texture: Callus: Yes N/A N/A Edema: No Excoriation: No Induration: No Crepitus: No Fluctuance: No Friable: No Rash: No Scarring: No Periwound Skin Moist: Yes N/A N/A Moisture: Maceration: No Dry/Scaly: No Periwound Skin Color: Atrophie Blanche: No N/A N/A Cyanosis: No Ecchymosis: No Erythema: No Hemosiderin Staining: No Mottled: No Pallor: No Rubor: No Temperature: No Abnormality N/A N/A Tenderness on Yes N/A N/A Palpation: Wound Preparation: Ulcer Cleansing: N/A N/A Rinsed/Irrigated with Saline Topical Anesthetic Applied: Other: lidocaine 4% Treatment Notes Electronic Signature(s) Signed: 06/25/2015 5:05:32 PM By: Curtis Sites Entered By:  Curtis Sites on 06/25/2015 11:06:37 Blough, [name] ([id number]) -------------------------------------------------------------------------------- Multi-Disciplinary Care Plan Details Patient Name: [name], GREESON. Date of Service: 06/25/2015 10:45 AM Medical Record Number: [id number] Patient Account Number: [id number] Date of Birth/Sex: [birth year] (64 y.o. Female) Treating RN: Curtis Sites Primary Care Physician: Aram Beecham Other Clinician: Referring Physician: Aram Beecham Treating Physician/Extender: BURNS III, Regis Bill in Treatment: 2 Active Inactive Wound/Skin Impairment Nursing Diagnoses: Impaired tissue integrity Goals: Patient/caregiver will verbalize understanding of skin care regimen Date Initiated: 06/11/2015 Goal Status: Active Ulcer/skin breakdown will have a volume reduction of 30% by week 4 Date Initiated: 06/11/2015 Goal Status: Active Ulcer/skin breakdown will have a volume reduction of 50% by week 8 Date Initiated: 06/11/2015 Goal Status: Active Ulcer/skin breakdown will have a volume reduction of 80% by week 12 Date Initiated: 06/11/2015 Goal Status: Active Ulcer/skin breakdown will heal within 14 weeks Date Initiated: 06/11/2015 Goal Status: Active Interventions: Assess patient/caregiver ability to obtain necessary supplies Assess ulceration(s) every visit Notes: Electronic Signature(s) Signed: 06/25/2015 5:05:32 PM By: Curtis Sites Entered By: Curtis Sites on 06/25/2015 11:06:29 Thunder, [name] ([id number]) -------------------------------------------------------------------------------- Patient/Caregiver Education Details Patient Name: Dominique Maffucci. Date of Service: 06/25/2015 10:45 AM Medical Record Number: [id number] Patient Account Number: [id number] Date of Birth/Gender: [birth year] (64 y.o. Female) Treating RN: Curtis Sites Primary Care Physician: Aram Beecham Other Clinician: Referring Physician: Aram Beecham Treating Physician/Extender: BURNS III, Regis Bill in Treatment: 2 Education Assessment Education Provided To: Patient Education Topics Provided Wound/Skin Impairment: Handouts: Other: wound care as ordered Methods: Demonstration, Explain/Verbal Responses: State content correctly Electronic Signature(s) Signed: 06/25/2015 5:05:32 PM By: Curtis Sites Entered By: Curtis Sites on 06/25/2015 11:25:51 Gillum, Mattalyn K. ([id number]) -------------------------------------------------------------------------------- Wound Assessment Details Patient Name: Dominique Marker K. Date of Service: 06/25/2015 10:45 AM Medical Record Number: [id number] Patient Account Number: [id number] Date of Birth/Sex: 1950/09/05 (64 y.o. Female) Treating RN: Curtis Sites Primary Care Physician: Aram Beecham Other Clinician: Referring Physician: Aram Beecham Treating Physician/Extender: BURNS III, Regis Bill in Treatment: 2 Wound Status Wound Number: 2 Primary Diabetic Wound/Ulcer of the Lower Etiology: Extremity Wound Location: Left Foot - Plantar Wound Open Wounding Event: Trauma Status: Date Acquired: 05/21/2015 Comorbid Cataracts, Angina, Arrhythmia, Weeks Of Treatment: 2 History: Coronary Artery Disease, Hypertension, Clustered Wound: No Peripheral Arterial Disease, Type II Diabetes Photos Photo Uploaded By: Curtis Sites on 06/25/2015 11:38:15 Wound Measurements Length: (cm) 0.2 Width: (cm) 0.1 Depth: (cm) 0.2 Area: (cm) 0.016 Volume: (cm) 0.003 % Reduction in Area: 74.6% % Reduction in Volume: 88% Epithelialization: None Tunneling: No Undermining: No Wound Description Classification: Grade 1 Wound Margin: Flat and Intact Exudate Amount: Medium Exudate Type: Serous Exudate Color: amber Foul Odor After Cleansing: No Wound Bed Granulation Amount: Large (67-100%) Exposed Structure Granulation Quality: Pink Fascia Exposed: No Necrotic Amount:  None Present  (0%) Fat Layer Exposed: No Mcnear, Danese K. ([id number]) Tendon Exposed: No Muscle Exposed: No Joint Exposed: No Bone Exposed: No Limited to Skin Breakdown Periwound Skin Texture Texture Color No Abnormalities Noted: No No Abnormalities Noted: No Callus: Yes Atrophie Blanche: No Crepitus: No Cyanosis: No Excoriation: No Ecchymosis: No Fluctuance: No Erythema: No Friable: No Hemosiderin Staining: No Induration: No Mottled: No Localized Edema: No Pallor: No Rash: No Rubor: No Scarring: No Temperature / Pain Moisture Temperature: No Abnormality No Abnormalities Noted: No Tenderness on Palpation: Yes Dry / Scaly: No Maceration: No Moist: Yes Wound Preparation Ulcer Cleansing: Rinsed/Irrigated with Saline Topical Anesthetic Applied: Other: lidocaine 4%, Treatment Notes Wound #2 (Left, Plantar Foot) 1. Cleansed with: Clean wound with Normal Saline 2. Anesthetic Topical Lidocaine 4% cream to wound bed prior to debridement 4. Dressing Applied: Prisma Ag Other dressing (specify in notes) Notes darco shoe with peg assist, comfeel plus Electronic Signature(s) Signed: 06/25/2015 5:05:32 PM By: Curtis Sites Entered By: Curtis Sites on 06/25/2015 11:01:07 Chovan, Sheli K. ([id number]) -------------------------------------------------------------------------------- Vitals Details Patient Name: Dominique Marker K. Date of Service: 06/25/2015 10:45 AM Medical Record Number: [id number] Patient Account Number: [id number] Date of Birth/Sex: [birth year] (64 y.o. Female) Treating RN: Curtis Sites Primary Care Physician: Aram Beecham Other Clinician: Referring Physician: Aram Beecham Treating Physician/Extender: BURNS III, Regis Bill in Treatment: 2 Vital Signs Time Taken: 11:03 Temperature (F): 98.2 Height (in): 68 Pulse (bpm): 64 Weight (lbs): 154 Respiratory Rate (breaths/min): 18 Body Mass Index (BMI): 23.4 Blood Pressure (mmHg): 156/58 Reference  Range: 80 - 120 mg / dl Electronic Signature(s) Signed: 06/25/2015 5:05:32 PM By: Curtis Sites Entered By: Curtis Sites on 06/25/2015 11:05:42

## 2015-07-01 ENCOUNTER — Other Ambulatory Visit: Payer: Self-pay | Admitting: Cardiovascular Disease

## 2015-07-02 ENCOUNTER — Encounter: Payer: Medicare Other | Admitting: Surgery

## 2015-07-02 DIAGNOSIS — E11621 Type 2 diabetes mellitus with foot ulcer: Secondary | ICD-10-CM | POA: Diagnosis not present

## 2015-07-03 NOTE — Progress Notes (Signed)
MILLA, WAHLBERG ([id number]) Visit Report for 07/02/2015 Arrival Information Details Patient Name: [name], [name]. Date of Service: 07/02/2015 1:45 PM Medical Record Number: [id number] Patient Account Number: [id number] Date of Birth/Sex: 04-Jul-1951 (64 y.o. Female) Treating RN: Huel Coventry Primary Care Physician: Aram Beecham Other Clinician: Referring Physician: Aram Beecham Treating Physician/Extender: BURNS III, Regis Bill in Treatment: 3 Visit Information History Since Last Visit Added or deleted any medications: No Patient Arrived: Ambulatory Any new allergies or adverse reactions: No Arrival Time: 13:55 Had a fall or experienced change in No Accompanied By: self activities of daily living that may affect Transfer Assistance: None risk of falls: Patient Identification Verified: Yes Signs or symptoms of abuse/neglect No Secondary Verification Process Yes since last visito Completed: Hospitalized since last visit: No Patient Has Alerts: Yes Has Dressing in Place as Prescribed: Yes Patient Alerts: Patient on Blood Has Footwear/Offloading in Place as Yes Thinner Prescribed: DMII Left: Surgical Shoe with warfarin Pressure Relief Insole Pain Present Now: No Electronic Signature(s) Signed: 07/02/2015 3:40:20 PM By: Elliot Gurney, RN, BSN, Kim RN, BSN Entered By: Elliot Gurney, RN, BSN, Kim on 07/02/2015 13:55:58 [name], [name] ([id number]) -------------------------------------------------------------------------------- Encounter Discharge Information Details Patient Name: Dominique Marker K. Date of Service: 07/02/2015 1:45 PM Medical Record Number: [id number] Patient Account Number: [id number] Date of Birth/Sex: 1950-12-07 (64 y.o. Female) Treating RN: Phillis Haggis Primary Care Physician: Aram Beecham Other Clinician: Referring Physician: Aram Beecham Treating Physician/Extender: BURNS III, Regis Bill in Treatment: 3 Encounter Discharge Information  Items Discharge Pain Level: 0 Discharge Condition: Stable Ambulatory Status: Cane Discharge Destination: Home Transportation: Private Auto Accompanied By: self Schedule Follow-up Appointment: Yes Medication Reconciliation completed and provided to Patient/Care Yes Kahleah Crass: Provided on Clinical Summary of Care: 07/02/2015 Form Type Recipient Paper Patient PP Electronic Signature(s) Signed: 07/02/2015 2:26:04 PM By: Gwenlyn Perking Entered By: Gwenlyn Perking on 07/02/2015 14:26:04 [name], [name] ([id number]) -------------------------------------------------------------------------------- Lower Extremity Assessment Details Patient Name: Dominique Marker K. Date of Service: 07/02/2015 1:45 PM Medical Record Number: [id number] Patient Account Number: [id number] Date of Birth/Sex: 05-20-1951 (64 y.o. Female) Treating RN: Huel Coventry Primary Care Physician: Aram Beecham Other Clinician: Referring Physician: Aram Beecham Treating Physician/Extender: BURNS III, Regis Bill in Treatment: 3 Vascular Assessment Pulses: Posterior Tibial Dorsalis Pedis Palpable: [Left:Yes] Extremity colors, hair growth, and conditions: Extremity Color: [Left:Normal] Hair Growth on Extremity: [Left:Yes] Temperature of Extremity: [Left:Warm] Capillary Refill: [Left:< 3 seconds] Toe Nail Assessment Left: Right: Thick: No Discolored: No Deformed: No Improper Length and Hygiene: No Electronic Signature(s) Signed: 07/02/2015 3:40:20 PM By: Elliot Gurney, RN, BSN, Kim RN, BSN Entered By: Elliot Gurney, RN, BSN, Kim on 07/02/2015 14:00:25 [name], [name] ([id number]) -------------------------------------------------------------------------------- Multi Wound Chart Details Patient Name: Dominique Marker K. Date of Service: 07/02/2015 1:45 PM Medical Record Number: [id number] Patient Account Number: [id number] Date of Birth/Sex: 1951/02/15 (64 y.o. Female) Treating RN: Huel Coventry Primary Care Physician: Aram Beecham Other Clinician: Referring Physician: Aram Beecham Treating Physician/Extender: BURNS III, Regis Bill in Treatment: 3 Vital Signs Height(in): 68 Pulse(bpm): 72 Weight(lbs): 154 Blood Pressure 137/53 (mmHg): Body Mass Index(BMI): 23 Temperature(F): 98.3 Respiratory Rate 18 (breaths/min): Photos: [2:No Photos] [N/A:N/A] Wound Location: [2:Left Foot - Plantar] [N/A:N/A] Wounding Event: [2:Trauma] [N/A:N/A] Primary Etiology: [2:Diabetic Wound/Ulcer of the Lower Extremity] [N/A:N/A] Comorbid History: [2:Cataracts, Angina, Arrhythmia, Coronary Artery Disease, Hypertension, Peripheral Arterial Disease, Type II Diabetes] [N/A:N/A] Date Acquired: [2:05/21/2015] [N/A:N/A] Weeks of Treatment: [2:3] [N/A:N/A] Wound Status: [2:Open] [N/A:N/A] Measurements L x W x D 0.3x0.3x0.5 [N/A:N/A] (cm) Area (cm) : [2:0.071] [N/A:N/A] Volume (cm) : [2:0.035] [N/A:N/A] %  Reduction in Area: [2:-12.70%] [N/A:N/A] % Reduction in Volume: -40.00% [N/A:N/A] Starting Position 1 12 (o'clock): Ending Position 1 [2:12] (o'clock): Maximum Distance 1 0.4 (cm): Undermining: [2:Yes] [N/A:N/A] Classification: [2:Grade 1] [N/A:N/A] Exudate Amount: [2:Medium] [N/A:N/A] Exudate Type: [2:Serous] [N/A:N/A] Exudate Color: amber N/A N/A Wound Margin: Flat and Intact N/A N/A Granulation Amount: Large (67-100%) N/A N/A Granulation Quality: Pink N/A N/A Necrotic Amount: None Present (0%) N/A N/A Exposed Structures: Fascia: No N/A N/A Fat: No Tendon: No Muscle: No Joint: No Bone: No Limited to Skin Breakdown Epithelialization: None N/A N/A Periwound Skin Texture: Callus: Yes N/A N/A Edema: No Excoriation: No Induration: No Crepitus: No Fluctuance: No Friable: No Rash: No Scarring: No Periwound Skin Moist: Yes N/A N/A Moisture: Maceration: No Dry/Scaly: No Periwound Skin Color: Atrophie Blanche: No N/A N/A Cyanosis: No Ecchymosis: No Erythema: No Hemosiderin Staining:  No Mottled: No Pallor: No Rubor: No Temperature: No Abnormality N/A N/A Tenderness on Yes N/A N/A Palpation: Wound Preparation: Ulcer Cleansing: N/A N/A Rinsed/Irrigated with Saline Topical Anesthetic Applied: Other: lidocaine 4% Treatment Notes Electronic Signature(s) Signed: 07/02/2015 3:40:20 PM By: Elliot GurneyWoody, RN, BSN, Kim RN, BSN Faucett, Antonietta KMarland Kitchen. ([id number]) Entered By: Elliot GurneyWoody, RN, BSN, Kim on 07/02/2015 14:04:36 Gauthreaux, [name] ([id number]) -------------------------------------------------------------------------------- Multi-Disciplinary Care Plan Details Patient Name: Dominique MaffucciOTEAT, Sagal K. Date of Service: 07/02/2015 1:45 PM Medical Record Number: [id number] Patient Account Number: [id number] Date of Birth/Sex: [birth year] (64 y.o. Female) Treating RN: Huel CoventryWoody, Kim Primary Care Physician: Aram BeechamSparks, Jeffrey Other Clinician: Referring Physician: Aram BeechamSparks, Jeffrey Treating Physician/Extender: BURNS III, Regis BillWALTER Weeks in Treatment: 3 Active Inactive Wound/Skin Impairment Nursing Diagnoses: Impaired tissue integrity Goals: Patient/caregiver will verbalize understanding of skin care regimen Date Initiated: 06/11/2015 Goal Status: Active Ulcer/skin breakdown will have a volume reduction of 30% by week 4 Date Initiated: 06/11/2015 Goal Status: Active Ulcer/skin breakdown will have a volume reduction of 50% by week 8 Date Initiated: 06/11/2015 Goal Status: Active Ulcer/skin breakdown will have a volume reduction of 80% by week 12 Date Initiated: 06/11/2015 Goal Status: Active Ulcer/skin breakdown will heal within 14 weeks Date Initiated: 06/11/2015 Goal Status: Active Interventions: Assess patient/caregiver ability to obtain necessary supplies Assess ulceration(s) every visit Notes: Electronic Signature(s) Signed: 07/02/2015 3:40:20 PM By: Elliot GurneyWoody, RN, BSN, Kim RN, BSN Entered By: Elliot GurneyWoody, RN, BSN, Kim on 07/02/2015 14:04:27 [name], [name]  ([id number]) -------------------------------------------------------------------------------- Pain Assessment Details Patient Name: [name], [name] Date of Service: 07/02/2015 1:45 PM Medical Record Number: [id number] Patient Account Number: [id number] Date of Birth/Sex: [birth year] (64 y.o. Female) Treating RN: Huel CoventryWoody, Kim Primary Care Physician: Aram BeechamSparks, Jeffrey Other Clinician: Referring Physician: Aram BeechamSparks, Jeffrey Treating Physician/Extender: BURNS III, Regis BillWALTER Weeks in Treatment: 3 Active Problems Location of Pain Severity and Description of Pain Patient Has Paino No Site Locations Pain Management and Medication Current Pain Management: Electronic Signature(s) Signed: 07/02/2015 3:40:20 PM By: Elliot GurneyWoody, RN, BSN, Kim RN, BSN Entered By: Elliot GurneyWoody, RN, BSN, Kim on 07/02/2015 13:56:52 [name], [name] ([id number]) -------------------------------------------------------------------------------- Patient/Caregiver Education Details Patient Name: Dominique MaffucciPOTEAT, Sammantha K. Date of Service: 07/02/2015 1:45 PM Medical Record Number: [id number] Patient Account Number: [id number] Date of Birth/Gender: [birth year] (64 y.o. Female) Treating RN: Ashok CordiaPinkerton, Debi Primary Care Physician: Aram BeechamSparks, Jeffrey Other Clinician: Referring Physician: Aram BeechamSparks, Jeffrey Treating Physician/Extender: BURNS III, Regis BillWALTER Weeks in Treatment: 3 Education Assessment Education Provided To: Patient Education Topics Provided Offloading: Handouts: Other: where shoe as ordered Methods: Explain/Verbal Responses: State content correctly Wound/Skin Impairment: Handouts: Other: change dressing as ordered Electronic Signature(s) Signed: 07/02/2015 4:01:39 PM By: Alejandro MullingPinkerton, Debra Entered By: Alejandro MullingPinkerton, Debra on 07/02/2015 14:25:45  [name], [name] ([id number]) -------------------------------------------------------------------------------- Wound Assessment Details Patient Name: [name], MINNICH. Date of Service: 07/02/2015 1:45  PM Medical Record Number: [id number] Patient Account Number: [id number] Date of Birth/Sex: 1950-10-23 (64 y.o. Female) Treating RN: Huel Coventry Primary Care Physician: Aram Beecham Other Clinician: Referring Physician: Aram Beecham Treating Physician/Extender: BURNS III, Regis Bill in Treatment: 3 Wound Status Wound Number: 2 Primary Diabetic Wound/Ulcer of the Lower Etiology: Extremity Wound Location: Left Foot - Plantar Wound Open Wounding Event: Trauma Status: Date Acquired: 05/21/2015 Comorbid Cataracts, Angina, Arrhythmia, Weeks Of Treatment: 3 History: Coronary Artery Disease, Hypertension, Clustered Wound: No Peripheral Arterial Disease, Type II Diabetes Wound Measurements Length: (cm) 0.3 Width: (cm) 0.3 Depth: (cm) 0.5 Area: (cm) 0.071 Volume: (cm) 0.035 % Reduction in Area: -12.7% % Reduction in Volume: -40% Epithelialization: None Tunneling: No Undermining: Yes Starting Position (o'clock): 12 Ending Position (o'clock): 12 Maximum Distance: (cm) 0.4 Wound Description Classification: Grade 1 Foul Odor Af Wound Margin: Flat and Intact Exudate Amount: Medium Exudate Type: Serous Exudate Color: amber ter Cleansing: No Wound Bed Granulation Amount: Large (67-100%) Exposed Structure Granulation Quality: Pink Fascia Exposed: No Necrotic Amount: None Present (0%) Fat Layer Exposed: No Tendon Exposed: No Muscle Exposed: No Joint Exposed: No Bone Exposed: No Limited to Skin Breakdown Periwound Skin Texture Akhtar, [name] ([id number]) Texture Color No Abnormalities Noted: No No Abnormalities Noted: No Callus: Yes Atrophie Blanche: No Crepitus: No Cyanosis: No Excoriation: No Ecchymosis: No Fluctuance: No Erythema: No Friable: No Hemosiderin Staining: No Induration: No Mottled: No Localized Edema: No Pallor: No Rash: No Rubor: No Scarring: No Temperature / Pain Moisture Temperature: No Abnormality No Abnormalities Noted:  No Tenderness on Palpation: Yes Dry / Scaly: No Maceration: No Moist: Yes Wound Preparation Ulcer Cleansing: Rinsed/Irrigated with Saline Topical Anesthetic Applied: Other: lidocaine 4%, Electronic Signature(s) Signed: 07/02/2015 3:40:20 PM By: Elliot Gurney, RN, BSN, Kim RN, BSN Entered By: Elliot Gurney, RN, BSN, Kim on 07/02/2015 14:02:44 [name], [name] ([id number]) -------------------------------------------------------------------------------- Vitals Details Patient Name: [name]. Date of Service: 07/02/2015 1:45 PM Medical Record Number: [id number] Patient Account Number: [id number] Date of Birth/Sex: 03-07-1951 (64 y.o. Female) Treating RN: Huel Coventry Primary Care Physician: Aram Beecham Other Clinician: Referring Physician: Aram Beecham Treating Physician/Extender: BURNS III, Regis Bill in Treatment: 3 Vital Signs Time Taken: 13:57 Temperature (F): 98.3 Height (in): 68 Pulse (bpm): 72 Weight (lbs): 154 Respiratory Rate (breaths/min): 18 Body Mass Index (BMI): 23.4 Blood Pressure (mmHg): 137/53 Reference Range: 80 - 120 mg / dl Electronic Signature(s) Signed: 07/02/2015 3:40:20 PM By: Elliot Gurney, RN, BSN, Kim RN, BSN Entered By: Elliot Gurney, RN, BSN, Kim on 07/02/2015 13:59:37

## 2015-07-03 NOTE — Progress Notes (Signed)
EARNEST, THALMAN ([id number]) Visit Report for 07/02/2015 Chief Complaint Document Details Patient Name: [name], [name]. Date of Service: 07/02/2015 1:45 PM Medical Record Patient Account Number: [id number] Number: Treating RN: Huel Coventry 30-Oct-1950 (64 y.o. Other Clinician: Date of Birth/Sex: Female) Treating BURNS III, Primary Care Physician: Aram Beecham Physician/Extender: Zollie Beckers Referring Physician: Bing Plume in Treatment: 3 Information Obtained from: Patient Chief Complaint Recurrent left 4th metatarsal head diabetic foot ulceration. Electronic Signature(s) Signed: 07/02/2015 3:24:44 PM By: Madelaine Bhat MD Entered By: Madelaine Bhat on 07/02/2015 14:26:37 Hensler, Kimmora KMarland Kitchen ([id number]) -------------------------------------------------------------------------------- Debridement Details Patient Name: Dominique Marker [name]. Date of Service: 07/02/2015 1:45 PM Medical Record Patient Account Number: [id number] Number: Treating RN: Huel Coventry Dec 17, 1950 (64 y.o. Other Clinician: Date of Birth/Sex: Female) Treating BURNS III, Primary Care Physician: Aram Beecham Physician/Extender: Zollie Beckers Referring Physician: Bing Plume in Treatment: 3 Debridement Performed for Wound #2 Left,Plantar Foot Assessment: Performed By: Physician BURNS III, Melanie Crazier., MD Debridement: Debridement Pre-procedure Yes Verification/Time Out Taken: Start Time: 14:15 Pain Control: Other : lidocaine 4% cream Level: Skin/Subcutaneous Tissue Total Area Debrided (L x 0.3 (cm) x 0.3 (cm) = 0.09 (cm) W): Tissue and other Viable, Non-Viable, Callus, Fat, Fibrin/Slough, Subcutaneous material debrided: Instrument: Blade, Curette Bleeding: None End Time: 14:19 Procedural Pain: 0 Post Procedural Pain: 0 Response to Treatment: Procedure was tolerated well Post Debridement Measurements of Total Wound Length: (cm) 0.3 Width: (cm) 0.4 Depth: (cm)  0.5 Volume: (cm) 0.047 Post Procedure Diagnosis Same as Pre-procedure Electronic Signature(s) Signed: 07/02/2015 3:24:44 PM By: Madelaine Bhat MD Signed: 07/02/2015 3:40:20 PM By: Elliot Gurney, RN, BSN, Kim RN, BSN Entered By: Madelaine Bhat on 07/02/2015 14:26:28 Pain, Tami [name]. ([id number]) -------------------------------------------------------------------------------- HPI Details Patient Name: Dominique Marker [name]. Date of Service: 07/02/2015 1:45 PM Medical Record Patient Account Number: [id number] Number: Treating RN: Huel Coventry Dec 25, 1950 (64 y.o. Other Clinician: Date of Birth/Sex: Female) Treating BURNS III, Primary Care Physician: Aram Beecham Physician/Extender: Zollie Beckers Referring Physician: Bing Plume in Treatment: 3 History of Present Illness HPI Description: Very pleasant 64 year old with past medical history significant for type 2 diabetes (hemoglobin A1c 7.3 in June 2016), peripheral neuropathy, peripheral vascular disease, and coronary artery disease. 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 She developed an ulceration in between her left third and fourth metatarsal heads in April 2014 after stepping on a piece of glass. She underwent LLE angioplasty in June 2014 by Dr. [name]. She was treated at the [facility] wound clinic with hyperbaric oxygen therapy for a deep tissue infection, which grew MRSA and did not significantly improve with antibiotics. X-rays showed no evidence for underlying osteomyelitis. She also underwent placement of Dermagraft x7. She completed a total of 60 HBO treatments at [facility] in Feb 2015. In-chamber TCOM >762mmHg. Her ulcer worsened after stopping HBO and she completed an additional 30 treatments (90 total) with significant improvement (eventually healed). She underwent angioplasty of her left peroneal artery, tibioperoneal trunk, and  superficial femoral artery on September 26, 2013. MRI on 10/12/13 showed no evidence for osteomyelitis or abscess. s/p Grafix x 5 and Epifix x 3 application with significant improvement. Significantly improved after starting to offload with knee walker, which she used at home but not in public for fear of falling. Also using darco shoe. Refused total contact cast secondary to fall history. Her ulcer was healed at her last clinic visit in Feb 2016. She returned  to clinic after stepping on a piece of plastic in October 2016. She developed a painful callus and was seen by Dr. Alberteen Spindleline in podiatry. The callus was debrided, and she was found to have an underlying ulceration. X-ray reportedly showed no evidence for underlying osteomyelitis. Records requested. She reports moderate pain with pressure. No claudication or rest pain. ABI 0.8. She has been applying silver alginate and offloading with a postop shoe. No fever or chills. Minimal bloody drainage. Electronic Signature(s) Signed: 07/02/2015 3:24:44 PM By: Madelaine BhatBurns, III, Carron Mcmurry MD Entered By: Madelaine BhatBurns, III, Tamikka Pilger on 07/02/2015 14:26:52 Schoenberger, Janace ArisPAMELA [name]. ([id number]) -------------------------------------------------------------------------------- Physical Exam Details Patient Name: Dominique MarkerOTEAT, Veralyn [name]. Date of Service: 07/02/2015 1:45 PM Medical Record Patient Account Number: [id number] Number: Treating RN: Huel CoventryWoody, Kim [birth year] ([birth year](64 y.o. Other Clinician: Date of Birth/Sex: Female) Treating BURNS III, Primary Care Physician: Aram BeechamSparks, Jeffrey Physician/Extender: Zollie BeckersWALTER Referring Physician: Bing PlumeSparks, Jeffrey Weeks in Treatment: 3 Constitutional . Pulse regular. Respirations normal and unlabored. Afebrile. . Notes Left fourth metatarsal head plantar ulceration improved. Moderate surrounding callus. Full-thickness. No exposed deep structures. No probe to bone but close. No cellulitis. No palpable pedal pulses per her baseline. Dopplerable DP and  PT. Left ABI 0.8. Electronic Signature(s) Signed: 07/02/2015 3:24:44 PM By: Madelaine BhatBurns, III, Shakeeta Godette MD Entered By: Madelaine BhatBurns, III, Daniella Dewberry on 07/02/2015 14:27:20 Sarratt, Janace ArisPAMELA [name]. ([id number]) -------------------------------------------------------------------------------- Physician Orders Details Patient Name: Dominique MaffucciPOTEAT, [name]. Date of Service: 07/02/2015 1:45 PM Medical Record Patient Account Number: [id number] Number: Treating RN: Phillis Haggisinkerton, Debi [birth year] (64 y.o. Other Clinician: Date of Birth/Sex: Female) Treating BURNS III, Primary Care Physician: Aram BeechamSparks, Jeffrey Physician/Extender: Zollie BeckersWALTER Referring Physician: Bing PlumeSparks, Jeffrey Weeks in Treatment: 3 Verbal / Phone Orders: Yes Clinician: Ashok CordiaPinkerton, Debi Read Back and Verified: Yes Diagnosis Coding Wound Cleansing Wound #2 Left,Plantar Foot o Clean wound with Normal Saline. Primary Wound Dressing Wound #2 Left,Plantar Foot o Aquacel Ag Secondary Dressing Wound #2 Left,Plantar Foot o Boardered Foam Dressing Dressing Change Frequency Wound #2 Left,Plantar Foot o Change dressing every other day. Follow-up Appointments Wound #2 Left,Plantar Foot o Return Appointment in 1 week. Off-Loading Wound #2 Left,Plantar Foot o Open toe surgical shoe with peg assist. Electronic Signature(s) Signed: 07/02/2015 3:24:44 PM By: Madelaine BhatBurns, III, Zoee Heeney MD Signed: 07/02/2015 4:01:39 PM By: Alejandro MullingPinkerton, Debra Entered By: Alejandro MullingPinkerton, Debra on 07/02/2015 14:23:32 Amborn, [name]. ([id number]) -------------------------------------------------------------------------------- Problem List Details Patient Name: Dominique MarkerOTEAT, Marlei [name]. Date of Service: 07/02/2015 1:45 PM Medical Record Patient Account Number: [id number] Number: Treating RN: Huel CoventryWoody, Kim [birth year] ([birth year](64 y.o. Other Clinician: Date of Birth/Sex: Female) Treating BURNS III, Primary Care Physician: Aram BeechamSparks, Jeffrey Physician/Extender: Zollie BeckersWALTER Referring Physician: Bing PlumeSparks,  Jeffrey Weeks in Treatment: 3 Active Problems ICD-10 Encounter Code Description Active Date Diagnosis E11.621 Type 2 diabetes mellitus with foot ulcer 06/11/2015 Yes E11.40 Type 2 diabetes mellitus with diabetic neuropathy, 06/11/2015 Yes unspecified I70.245 Atherosclerosis of native arteries of left leg with ulceration 06/11/2015 Yes of other part of foot I25.10 Atherosclerotic heart disease of native coronary artery 06/11/2015 Yes without angina pectoris Inactive Problems Resolved Problems Electronic Signature(s) Signed: 07/02/2015 3:24:44 PM By: Madelaine BhatBurns, III, Ofilia Rayon MD Entered By: Madelaine BhatBurns, III, Jade Burright on 07/02/2015 14:26:07 Ledford, Aliani [name]. ([id number]) -------------------------------------------------------------------------------- Progress Note Details Patient Name: Dominique MarkerPOTEAT, [name]. Date of Service: 07/02/2015 1:45 PM Medical Record Patient Account Number: [id number] Number: Treating RN: Huel CoventryWoody, Kim [birth year] ([birth year](64 y.o. Other Clinician: Date of Birth/Sex: Female) Treating BURNS III, Primary Care Physician: Aram BeechamSparks, Jeffrey Physician/Extender: Zollie BeckersWALTER Referring Physician: Bing PlumeSparks, Jeffrey Weeks in Treatment: 3 Subjective Chief Complaint Information obtained from Patient  Recurrent left 4th metatarsal head diabetic foot ulceration. History of Present Illness (HPI) Very pleasant 64 year old with past medical history significant for type 2 diabetes (hemoglobin A1c 7.3 in June 2016), peripheral neuropathy, peripheral vascular disease, and coronary artery disease. 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 She developed an ulceration in between her left third and fourth metatarsal heads in April 2014 after stepping on a piece of glass. She underwent LLE angioplasty in June 2014 by Dr. [name]. She was treated at the [facility] wound clinic with hyperbaric oxygen therapy for a deep tissue infection,  which grew MRSA and did not significantly improve with antibiotics. X-rays showed no evidence for underlying osteomyelitis. She also underwent placement of Dermagraft x7. She completed a total of 60 HBO treatments at [facility] in Feb 2015. In-chamber TCOM >749mmHg. Her ulcer worsened after stopping HBO and she completed an additional 30 treatments (90 total) with significant improvement (eventually healed). She underwent angioplasty of her left peroneal artery, tibioperoneal trunk, and superficial femoral artery on September 26, 2013. MRI on 10/12/13 showed no evidence for osteomyelitis or abscess. s/p Grafix x 5 and Epifix x 3 application with significant improvement. Significantly improved after starting to offload with knee walker, which she used at home but not in public for fear of falling. Also using darco shoe. Refused total contact cast secondary to fall history. Her ulcer was healed at her last clinic visit in Feb 2016. She returned to clinic after stepping on a piece of plastic in October 2016. She developed a painful callus and was seen by Dr. Alberteen Spindle in podiatry. The callus was debrided, and she was found to have an underlying ulceration. X-ray reportedly showed no evidence for underlying osteomyelitis. Records requested. She reports moderate pain with pressure. No claudication or rest pain. ABI 0.8. She has been applying silver alginate and offloading with a postop shoe. No fever or chills. Minimal bloody drainage. Objective [name], [name]. ([id number]) Constitutional Pulse regular. Respirations normal and unlabored. Afebrile. Vitals Time Taken: 1:57 PM, Height: 68 in, Weight: 154 lbs, BMI: 23.4, Temperature: 98.3 F, Pulse: 72 bpm, Respiratory Rate: 18 breaths/min, Blood Pressure: 137/53 mmHg. General Notes: Left fourth metatarsal head plantar ulceration improved. Moderate surrounding callus. Full- thickness. No exposed deep structures. No probe to bone but close. No cellulitis. No  palpable pedal pulses per her baseline. Dopplerable DP and PT. Left ABI 0.8. Integumentary (Hair, Skin) Wound #2 status is Open. Original cause of wound was Trauma. The wound is located on the Left,Plantar Foot. The wound measures 0.3cm length x 0.3cm width x 0.5cm depth; 0.071cm^2 area and 0.035cm^3 volume. The wound is limited to skin breakdown. There is no tunneling noted, however, there is undermining starting at 12:00 and ending at 12:00 with a maximum distance of 0.4cm. There is a medium amount of serous drainage noted. The wound margin is flat and intact. There is large (67-100%) pink granulation within the wound bed. There is no necrotic tissue within the wound bed. The periwound skin appearance exhibited: Callus, Moist. The periwound skin appearance did not exhibit: Crepitus, Excoriation, Fluctuance, Friable, Induration, Localized Edema, Rash, Scarring, Dry/Scaly, Maceration, Atrophie Blanche, Cyanosis, Ecchymosis, Hemosiderin Staining, Mottled, Pallor, Rubor, Erythema. Periwound temperature was noted as No Abnormality. The periwound has tenderness on palpation. 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 Arterial insufficiency. Procedures Wound #2 [name], [name]. ([id number]) Wound #2 is a Diabetic Wound/Ulcer of the Lower Extremity located on the Left,Plantar Foot . There was a Skin/Subcutaneous Tissue Debridement ([id number]) debridement with total area of 0.09 sq cm performed by BURNS III, Melanie Crazier., MD. with the following instrument(s): Blade and Curette to remove Viable and Non-Viable  tissue/material including Fat, Fibrin/Slough, Callus, and Subcutaneous after achieving pain control using Other (lidocaine 4% cream). A time out was conducted prior to the start of the procedure. There was no bleeding. The procedure was tolerated well with a pain level of 0 throughout and a pain level of 0 following the procedure. Post Debridement Measurements: 0.3cm length x 0.4cm width x 0.5cm depth; 0.047cm^3 volume. Post procedure Diagnosis Wound #2: Same as Pre-Procedure Plan Wound Cleansing: Wound #2 Left,Plantar Foot: Clean wound with Normal Saline. Primary Wound Dressing: Wound #2 Left,Plantar Foot: Aquacel Ag Secondary Dressing: Wound #2 Left,Plantar Foot: Boardered Foam Dressing Dressing Change Frequency: Wound #2 Left,Plantar Foot: Change dressing every other day. Follow-up Appointments: Wound #2 Left,Plantar Foot: Return Appointment in 1 week. Off-Loading: Wound #2 Left,Plantar Foot: Open toe surgical shoe with peg assist. continue with silver alginate and offloading measures. Scheduled to receive custom orthotic next week. Electronic Signature(s) Signed: 07/02/2015 3:24:44 PM By: Madelaine Bhat MD Entered By: Madelaine Bhat on 07/02/2015 14:27:55 Denzler, [name]. ([id number]) [name], [name] ([id number]) -------------------------------------------------------------------------------- SuperBill Details Patient Name: Dominique Marker [name]. Date of Service: 07/02/2015 Medical Record Patient Account Number: [id number] Number: Treating RN: Huel Coventry [birth year] (64 y.o. Other Clinician: Date of Birth/Sex: Female) Treating BURNS III, Primary Care Physician: Aram Beecham Physician/Extender: Zollie Beckers Referring Physician: Bing Plume in Treatment: 3 Diagnosis Coding ICD-10 Codes Code Description E11.621 Type 2 diabetes mellitus with foot ulcer E11.40 Type 2 diabetes mellitus with diabetic neuropathy, unspecified I70.245 Atherosclerosis of  native arteries of left leg with ulceration of other part of foot I25.10 Atherosclerotic heart disease of native coronary artery without angina pectoris Facility Procedures CPT4 Code: [id number] Description: 11042 - DEB SUBQ TISSUE 20 SQ CM/< ICD-10 Description Diagnosis E11.621 Type 2 diabetes mellitus with foot ulcer Modifier: Quantity: 1 Physician Procedures CPT4 Code: [id number] Description: 11042 - WC PHYS SUBQ TISS 20 SQ CM ICD-10 Description Diagnosis E11.621 Type 2 diabetes mellitus with foot ulcer Modifier: Quantity: 1 Electronic Signature(s) Signed: 07/02/2015 3:24:44 PM By: Madelaine Bhat MD Entered By: Madelaine Bhat on 07/02/2015 14:28:03

## 2015-07-07 ENCOUNTER — Ambulatory Visit
Admission: RE | Admit: 2015-07-07 | Discharge: 2015-07-07 | Disposition: A | Discharge status: Home / Self Care | Payer: Medicare Other | Source: Ambulatory Visit

## 2015-07-07 DIAGNOSIS — Z1231 Encounter for screening mammogram for malignant neoplasm of breast: Secondary | ICD-10-CM

## 2015-07-09 ENCOUNTER — Ambulatory Visit: Payer: Medicare Other | Admitting: Surgery

## 2015-07-09 ENCOUNTER — Ambulatory Visit (INDEPENDENT_AMBULATORY_CARE_PROVIDER_SITE_OTHER): Payer: Medicare Other | Admitting: *Deleted

## 2015-07-09 DIAGNOSIS — I2699 Other pulmonary embolism without acute cor pulmonale: Secondary | ICD-10-CM

## 2015-07-09 DIAGNOSIS — Z5181 Encounter for therapeutic drug level monitoring: Secondary | ICD-10-CM

## 2015-07-09 DIAGNOSIS — I480 Paroxysmal atrial fibrillation: Secondary | ICD-10-CM

## 2015-07-09 LAB — POCT INR: INR: 1.2

## 2015-07-15 ENCOUNTER — Other Ambulatory Visit: Payer: Self-pay | Admitting: Internal Medicine

## 2015-07-15 DIAGNOSIS — R4702 Dysphasia: Secondary | ICD-10-CM

## 2015-07-16 ENCOUNTER — Ambulatory Visit
Admission: RE | Admit: 2015-07-16 | Discharge: 2015-07-16 | Disposition: A | Discharge status: Home / Self Care | Payer: Medicare Other | Source: Ambulatory Visit | Attending: Surgery | Admitting: Surgery

## 2015-07-16 ENCOUNTER — Ambulatory Visit (INDEPENDENT_AMBULATORY_CARE_PROVIDER_SITE_OTHER): Payer: Medicare Other

## 2015-07-16 ENCOUNTER — Other Ambulatory Visit: Payer: Self-pay | Admitting: Surgery

## 2015-07-16 ENCOUNTER — Encounter: Payer: Medicare Other | Attending: Surgery | Admitting: Surgery

## 2015-07-16 DIAGNOSIS — I2510 Atherosclerotic heart disease of native coronary artery without angina pectoris: Secondary | ICD-10-CM | POA: Diagnosis not present

## 2015-07-16 DIAGNOSIS — R52 Pain, unspecified: Secondary | ICD-10-CM

## 2015-07-16 DIAGNOSIS — M86672 Other chronic osteomyelitis, left ankle and foot: Secondary | ICD-10-CM | POA: Diagnosis present

## 2015-07-16 DIAGNOSIS — L97421 Non-pressure chronic ulcer of left heel and midfoot limited to breakdown of skin: Secondary | ICD-10-CM | POA: Insufficient documentation

## 2015-07-16 DIAGNOSIS — Z5181 Encounter for therapeutic drug level monitoring: Secondary | ICD-10-CM | POA: Diagnosis not present

## 2015-07-16 DIAGNOSIS — I70245 Atherosclerosis of native arteries of left leg with ulceration of other part of foot: Secondary | ICD-10-CM | POA: Insufficient documentation

## 2015-07-16 DIAGNOSIS — E11621 Type 2 diabetes mellitus with foot ulcer: Secondary | ICD-10-CM | POA: Diagnosis present

## 2015-07-16 DIAGNOSIS — I739 Peripheral vascular disease, unspecified: Secondary | ICD-10-CM | POA: Diagnosis not present

## 2015-07-16 DIAGNOSIS — M79672 Pain in left foot: Secondary | ICD-10-CM | POA: Insufficient documentation

## 2015-07-16 DIAGNOSIS — I10 Essential (primary) hypertension: Secondary | ICD-10-CM | POA: Insufficient documentation

## 2015-07-16 DIAGNOSIS — I480 Paroxysmal atrial fibrillation: Secondary | ICD-10-CM | POA: Diagnosis not present

## 2015-07-16 DIAGNOSIS — I2699 Other pulmonary embolism without acute cor pulmonale: Secondary | ICD-10-CM | POA: Diagnosis not present

## 2015-07-16 LAB — POCT INR: INR: 1.8

## 2015-07-16 NOTE — Progress Notes (Addendum)
Dominique, [name] ([id number]) Visit Report for 07/16/2015 Arrival Information Details Patient Name: [name]. Date of Service: 07/16/2015 9:15 AM Medical Record Number: [id number] Patient Account Number: [id number] Date of Birth/Sex: 1951/01/17 (64 y.o. Female) Treating RN: Afful, RN, BSN, [place] Sink Primary Care Physician: Aram Beecham Other Clinician: Referring Physician: Aram Beecham Treating Physician/Extender: BURNS III, Regis Bill in Treatment: 5 Visit Information History Since Last Visit Any new allergies or adverse reactions: No Patient Arrived: Ambulatory Had a fall or experienced change in No Arrival Time: 09:17 activities of daily living that may affect Accompanied By: self risk of falls: Transfer Assistance: None Signs or symptoms of abuse/neglect since last No Patient Identification Verified: Yes visito Secondary Verification Process Yes Hospitalized since last visit: No Completed: Has Dressing in Place as Prescribed: Yes Patient Has Alerts: Yes Pain Present Now: No Patient Alerts: Patient on Blood Thinner DMII warfarin Electronic Signature(s) Signed: 07/16/2015 9:18:10 AM By: Elpidio Eric BSN, RN Entered By: Elpidio Eric on 07/16/2015 09:18:10 Tess, [name] ([id number]) -------------------------------------------------------------------------------- Encounter Discharge Information Details Patient Name: [name] [name]. Date of Service: 07/16/2015 9:15 AM Medical Record Number: [id number] Patient Account Number: [id number] Date of Birth/Sex: [birth year] (64 y.o. Female) Treating RN: Clover Mealy, RN, BSN, [place] Sink Primary Care Physician: Aram Beecham Other Clinician: Referring Physician: Aram Beecham Treating Physician/Extender: BURNS III, Regis Bill in Treatment: 5 Encounter Discharge Information Items Discharge Pain Level: 0 Discharge Condition: Stable Ambulatory Status: Ambulatory Discharge Destination: Home Transportation: Private  Auto Accompanied By: self Schedule Follow-up Appointment: No Medication Reconciliation completed and provided to Patient/Care No [name]: Provided on Clinical Summary of Care: 07/16/2015 Form Type Recipient Paper Patient PP Electronic Signature(s) Signed: 07/16/2015 9:45:25 AM By: Gwenlyn Perking Previous Signature: 07/16/2015 9:34:18 AM Version By: Elpidio Eric BSN, RN Entered By: Gwenlyn Perking on 07/16/2015 09:45:25 Shutters, [name] ([id number]) -------------------------------------------------------------------------------- Lower Extremity Assessment Details Patient Name: [name] [name]. Date of Service: 07/16/2015 9:15 AM Medical Record Number: [id number] Patient Account Number: [id number] Date of Birth/Sex: [birth year] (64 y.o. Female) Treating RN: Afful, RN, BSN, [place] Sink Primary Care Physician: Aram Beecham Other Clinician: Referring Physician: Aram Beecham Treating Physician/Extender: BURNS III, Regis Bill in Treatment: 5 Vascular Assessment Pulses: Posterior Tibial Dorsalis Pedis Palpable: [Left:Yes] Extremity colors, hair growth, and conditions: Extremity Color: [Left:Normal] Hair Growth on Extremity: [Left:No] Temperature of Extremity: [Left:Warm] Capillary Refill: [Left:< 3 seconds] Toe Nail Assessment Left: Right: Thick: No Discolored: No Deformed: No Improper Length and Hygiene: No Electronic Signature(s) Signed: 07/16/2015 9:23:14 AM By: Elpidio Eric BSN, RN Entered By: Elpidio Eric on 07/16/2015 09:23:14 Locastro, [name] ([id number]) -------------------------------------------------------------------------------- Multi Wound Chart Details Patient Name: [name] [name]. Date of Service: 07/16/2015 9:15 AM Medical Record Number: [id number] Patient Account Number: [id number] Date of Birth/Sex: Apr 02, 1951 (64 y.o. Female) Treating RN: Clover Mealy, RN, BSN, [place] Sink Primary Care Physician: Aram Beecham Other Clinician: Referring Physician: Aram Beecham Treating Physician/Extender: BURNS III, Regis Bill in Treatment: 5 Vital Signs Height(in): 68 Pulse(bpm): 70 Weight(lbs): 154 Blood Pressure 168/74 (mmHg): Body Mass Index(BMI): 23 Temperature(F): 97.8 Respiratory Rate 18 (breaths/min): Photos: [2:No Photos] [N/A:N/A] Wound Location: [2:Left Foot - Plantar] [N/A:N/A] Wounding Event: [2:Trauma] [N/A:N/A] Primary Etiology: [2:Diabetic Wound/Ulcer of the Lower Extremity] [N/A:N/A] Comorbid History: [2:Cataracts, Angina, Arrhythmia, Coronary Artery Disease, Hypertension, Peripheral Arterial Disease, Type II Diabetes] [N/A:N/A] Date Acquired: [2:05/21/2015] [N/A:N/A] Weeks of Treatment: [2:5] [N/A:N/A] Wound Status: [2:Open] [N/A:N/A] Measurements L x W x D 0.4x0.4x0.5 [N/A:N/A] (cm) Area (cm) : [2:0.126] [N/A:N/A] Volume (cm) : [2:0.063] [N/A:N/A] % Reduction in Area: [2:-100.00%] [N/A:N/A] % Reduction in  Volume: -152.00% [N/A:N/A] Classification: [2:Grade 1] [N/A:N/A] Exudate Amount: [2:Small] [N/A:N/A] Exudate Type: [2:Serous] [N/A:N/A] Exudate Color: [2:amber] [N/A:N/A] Wound Margin: [2:Flat and Intact] [N/A:N/A] Granulation Amount: [2:Large (67-100%)] [N/A:N/A] Granulation Quality: [2:Pink] [N/A:N/A] Necrotic Amount: [2:None Present (0%)] [N/A:N/A] Exposed Structures: [2:Fascia: No Fat: No] [N/A:N/A] Tendon: No Muscle: No Joint: No Bone: No Limited to Skin Breakdown Epithelialization: Small (1-33%) N/A N/A Periwound Skin Texture: Callus: Yes N/A N/A Edema: No Excoriation: No Induration: No Crepitus: No Fluctuance: No Friable: No Rash: No Scarring: No Periwound Skin Maceration: Yes N/A N/A Moisture: Moist: Yes Dry/Scaly: No Periwound Skin Color: Atrophie Blanche: No N/A N/A Cyanosis: No Ecchymosis: No Erythema: No Hemosiderin Staining: No Mottled: No Pallor: No Rubor: No Temperature: No Abnormality N/A N/A Tenderness on Yes N/A N/A Palpation: Wound Preparation: Ulcer Cleansing: N/A  N/A Rinsed/Irrigated with Saline Topical Anesthetic Applied: Other: lidocaine 4% Treatment Notes Electronic Signature(s) Signed: 07/16/2015 9:27:09 AM By: Elpidio EricAfful, [name] BSN, RN Entered By: Elpidio EricAfful, [name] on 07/16/2015 09:27:09 Dominique MaffucciPOTEAT, [name]. ([id number]) -------------------------------------------------------------------------------- Multi-Disciplinary Care Plan Details Patient Name: Dominique MaffucciOTEAT, [name]. Date of Service: 07/16/2015 9:15 AM Medical Record Number: [id number] Patient Account Number: [id number] Date of Birth/Sex: 03/29/1951 (64 y.o. Female) Treating RN: Clover MealyAfful, RN, BSN, [place] Sinkita Primary Care Physician: Aram BeechamSparks, Jeffrey Other Clinician: Referring Physician: Aram BeechamSparks, Jeffrey Treating Physician/Extender: BURNS III, Regis BillWALTER Weeks in Treatment: 5 Active Inactive Wound/Skin Impairment Nursing Diagnoses: Impaired tissue integrity Goals: Patient/caregiver will verbalize understanding of skin care regimen Date Initiated: 06/11/2015 Goal Status: Active Ulcer/skin breakdown will have a volume reduction of 30% by week 4 Date Initiated: 06/11/2015 Goal Status: Active Ulcer/skin breakdown will have a volume reduction of 50% by week 8 Date Initiated: 06/11/2015 Goal Status: Active Ulcer/skin breakdown will have a volume reduction of 80% by week 12 Date Initiated: 06/11/2015 Goal Status: Active Ulcer/skin breakdown will heal within 14 weeks Date Initiated: 06/11/2015 Goal Status: Active Interventions: Assess patient/caregiver ability to obtain necessary supplies Assess ulceration(s) every visit Notes: Electronic Signature(s) Signed: 07/16/2015 9:24:48 AM By: Elpidio EricAfful, [name] BSN, RN Entered By: Elpidio EricAfful, [name] on 07/16/2015 09:24:48 Younts, Dominique ArisPAMELA [name]. ([id number]) -------------------------------------------------------------------------------- Pain Assessment Details Patient Name: Dominique MarkerPOTEAT, [name]. Date of Service: 07/16/2015 9:15 AM Medical Record Number: [id number] Patient Account Number:  [id number] Date of Birth/Sex: 03/29/1951 (64 y.o. Female) Treating RN: Clover MealyAfful, RN, BSN, [place] Sinkita Primary Care Physician: Aram BeechamSparks, Jeffrey Other Clinician: Referring Physician: Aram BeechamSparks, Jeffrey Treating Physician/Extender: BURNS III, Regis BillWALTER Weeks in Treatment: 5 Active Problems Location of Pain Severity and Description of Pain Patient Has Paino No Site Locations Pain Management and Medication Current Pain Management: Electronic Signature(s) Signed: 07/16/2015 9:19:11 AM By: Elpidio EricAfful, [name] BSN, RN Entered By: Elpidio EricAfful, [name] on 07/16/2015 09:19:11 Munger, Dominique ArisPAMELA [name]. ([id number]) -------------------------------------------------------------------------------- Patient/Caregiver Education Details Patient Name: Dominique MaffucciPOTEAT, Simmie [name]. Date of Service: 07/16/2015 9:15 AM Medical Record Number: [id number] Patient Account Number: [id number] Date of Birth/Gender: 03/29/1951 (64 y.o. Female) Treating RN: Clover MealyAfful, RN, BSN, [place] Sinkita Primary Care Physician: Aram BeechamSparks, Jeffrey Other Clinician: Referring Physician: Aram BeechamSparks, Jeffrey Treating Physician/Extender: BURNS III, Regis BillWALTER Weeks in Treatment: 5 Education Assessment Education Provided To: Patient Education Topics Provided Basic Hygiene: Methods: Explain/Verbal Responses: State content correctly Wound/Skin Impairment: Methods: Explain/Verbal Responses: State content correctly Electronic Signature(s) Signed: 07/16/2015 9:34:35 AM By: Elpidio EricAfful, [name] BSN, RN Entered By: Elpidio EricAfful, [name] on 07/16/2015 09:34:35 Taniguchi, Dominique ArisPAMELA [name]. ([id number]) -------------------------------------------------------------------------------- Wound Assessment Details Patient Name: Dominique MarkerPOTEAT, [name]. Date of Service: 07/16/2015 9:15 AM Medical Record Number: [id number] Patient Account Number: [id number] Date of Birth/Sex: 03/29/1951 (64 y.o. Female) Treating RN: Afful, RN, BSN, [place] Sinkita Primary Care Physician: Judithann SheenSparks,  Tinnie Gens Other Clinician: Referring Physician: Aram Beecham Treating Physician/Extender:  BURNS III, Regis Bill in Treatment: 5 Wound Status Wound Number: 2 Primary Diabetic Wound/Ulcer of the Lower Etiology: Extremity Wound Location: Left Foot - Plantar Wound Open Wounding Event: Trauma Status: Date Acquired: 05/21/2015 Comorbid Cataracts, Angina, Arrhythmia, Weeks Of Treatment: 5 History: Coronary Artery Disease, Hypertension, Clustered Wound: No Peripheral Arterial Disease, Type II Diabetes Photos Photo Uploaded By: Elpidio Eric on 07/16/2015 14:10:35 Wound Measurements Length: (cm) 0.4 Width: (cm) 0.4 Depth: (cm) 0.5 Area: (cm) 0.126 Volume: (cm) 0.063 % Reduction in Area: -100% % Reduction in Volume: -152% Epithelialization: Small (1-33%) Tunneling: No Undermining: No Wound Description Classification: Grade 1 Wound Margin: Flat and Intact Exudate Amount: Small Exudate Type: Serous Exudate Color: amber Foul Odor After Cleansing: No Wound Bed Granulation Amount: Large (67-100%) Exposed Structure Granulation Quality: Pink Fascia Exposed: No Necrotic Amount: None Present (0%) Fat Layer Exposed: No Krus, Donis [name]. ([id number]) Tendon Exposed: No Muscle Exposed: No Joint Exposed: No Bone Exposed: No Limited to Skin Breakdown Periwound Skin Texture Texture Color No Abnormalities Noted: No No Abnormalities Noted: No Callus: Yes Atrophie Blanche: No Crepitus: No Cyanosis: No Excoriation: No Ecchymosis: No Fluctuance: No Erythema: No Friable: No Hemosiderin Staining: No Induration: No Mottled: No Localized Edema: No Pallor: No Rash: No Rubor: No Scarring: No Temperature / Pain Moisture Temperature: No Abnormality No Abnormalities Noted: No Tenderness on Palpation: Yes Dry / Scaly: No Maceration: Yes Moist: Yes Wound Preparation Ulcer Cleansing: Rinsed/Irrigated with Saline Topical Anesthetic Applied: Other: lidocaine 4%, Treatment Notes Wound #2 (Left, Plantar Foot) 1. Cleansed with: Cleanse wound with antibacterial soap  and water 4. Dressing Applied: Aquacel Ag 5. Secondary Dressing Applied Bordered Foam Dressing Notes darco shoe with peg assist, comfeel plus Electronic Signature(s) Signed: 07/16/2015 9:23:50 AM By: Elpidio Eric BSN, RN Entered By: Elpidio Eric on 07/16/2015 09:23:50 Langwell, [name] ([id number]) -------------------------------------------------------------------------------- Vitals Details Patient Name: [name] [name]. Date of Service: 07/16/2015 9:15 AM Medical Record Number: [id number] Patient Account Number: [id number] Date of Birth/Sex: 07-Feb-1951 (64 y.o. Female) Treating RN: Afful, RN, BSN, [name] Primary Care Physician: Aram Beecham Other Clinician: Referring Physician: Aram Beecham Treating Physician/Extender: BURNS III, Regis Bill in Treatment: 5 Vital Signs Time Taken: 09:19 Temperature (F): 97.8 Height (in): 68 Pulse (bpm): 70 Weight (lbs): 154 Respiratory Rate (breaths/min): 18 Body Mass Index (BMI): 23.4 Blood Pressure (mmHg): 168/74 Reference Range: 80 - 120 mg / dl Electronic Signature(s) Signed: 07/16/2015 9:19:50 AM By: Elpidio Eric BSN, RN Entered By: Elpidio Eric on 07/16/2015 09:19:50

## 2015-07-17 NOTE — Progress Notes (Signed)
[name] ([id number]) Visit Report for 07/16/2015 Chief Complaint Document Details Patient Name: [name], [name]. Date of Service: 07/16/2015 9:15 AM Medical Record Patient Account Number: [id number] Number: Treating RN: Clover Mealy, RN, BSN, Rita 1951/04/28 [phone] y.o. 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 Electronic Signature(s) Signed: 07/16/2015 2:46:32 PM By: Madelaine Bhat MD Entered By: Madelaine Bhat on 07/16/2015 13:52:35 Routt, Lesley KMarland Kitchen ([id number]) -------------------------------------------------------------------------------- Debridement Details Patient Name: Dominique Marker K. Date of Service: 07/16/2015 9:15 AM Medical Record Patient Account Number: [id number] Number: Treating RN: Clover Mealy, RN, BSN, Rita 14-Oct-1950 [phone] y.o. Other Clinician: Date of Birth/Sex: Female) Treating BURNS III, Primary Care Physician: Aram Beecham Physician/Extender: Zollie Beckers Referring Physician: Bing Plume in Treatment: 5 Debridement Performed for Wound #2 Left,Plantar Foot Assessment: Performed By: Physician BURNS III, Melanie Crazier., MD Debridement: Debridement Pre-procedure Yes Verification/Time Out Taken: Start Time: 09:29 Pain Control: Lidocaine 4% Topical Solution Level: Skin/Subcutaneous Tissue Total Area Debrided (L x 0.4 (cm) x 0.4 (cm) = 0.16 (cm) W): Tissue and other Viable, Non-Viable, Callus, Fat, Fibrin/Slough, Subcutaneous material debrided: Instrument: Blade, Curette Bleeding: Minimum Hemostasis Achieved: Pressure End Time: 09:35 Procedural Pain: 0 Post Procedural Pain: 0 Response to Treatment: Procedure was tolerated well Post Debridement Measurements of Total  Wound Length: (cm) 0.5 Width: (cm) 0.5 Depth: (cm) 0.5 Volume: (cm) 0.098 Post Procedure Diagnosis Same as Pre-procedure Electronic Signature(s) Signed: 07/16/2015 2:11:52 PM By: Elpidio Eric BSN, RN Signed: 07/16/2015 2:46:32 PM By: Madelaine Bhat MD Previous Signature: 07/16/2015 9:30:40 AM Version By: Elpidio Eric BSN, RN Entered By: Madelaine Bhat on 07/16/2015 13:52:25 Simkins, Sherrine K. ([id number]) Hirt, Jamera K. ([id number]) -------------------------------------------------------------------------------- HPI Details Patient Name: [name], [name] Date of Service: 07/16/2015 9:15 AM Medical Record Patient Account Number: [id number] Number: Treating RN: Clover Mealy, RN, BSN, Rita [birth year] [phone] y.o. Other Clinician: Date of Birth/Sex: Female) Treating BURNS III, Primary Care Physician: Aram Beecham Physician/Extender: Zollie Beckers Referring Physician: Bing Plume in Treatment: 5 History of Present Illness HPI Description: Very pleasant 64 year old with past medical history significant for type 2 diabetes (hemoglobin A1c 7.3 in June 2016), peripheral neuropathy, peripheral vascular disease, and coronary artery disease. 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 She developed an ulceration in between her left third and fourth metatarsal heads in April 2014 after stepping on a piece of glass. She underwent LLE angioplasty in June 2014 by Dr. [name]. She was treated at the [facility] wound clinic with hyperbaric oxygen therapy for a deep tissue infection, which grew MRSA and did not significantly improve with antibiotics. X-rays showed no evidence for underlying osteomyelitis. She also underwent placement of Dermagraft x7. She completed a total of 60 HBO treatments at [facility] in Feb 2015. In-chamber TCOM >754mmHg. Her ulcer worsened after stopping HBO and she completed an additional  30 treatments (90 total) with significant improvement (eventually healed). She underwent angioplasty of her left peroneal artery, tibioperoneal trunk, and superficial femoral artery on September 26, 2013. MRI on 10/12/13 showed no evidence for osteomyelitis or abscess. s/p Grafix x 5 and Epifix x 3 application with significant improvement. Significantly improved after starting to offload with knee walker, which she used at home but not in public for fear of falling. Also using darco shoe. Refused  total contact cast secondary to fall history. Her ulcer was healed at her last clinic visit in Feb 2016. She returned to clinic after stepping on a piece of plastic in October 2016. She developed a painful callus and was seen by Dr. Alberteen Spindleline in podiatry. The callus was debrided, and she was found to have an underlying ulceration. X-ray reportedly showed no evidence for underlying osteomyelitis. Records requested. She reports moderate pain with pressure. No claudication or rest pain. ABI 0.8. She has been applying silver alginate and offloading with a postop shoe. No fever or chills. Minimal bloody drainage. Electronic Signature(s) Signed: 07/16/2015 2:46:32 PM By: Madelaine BhatBurns, III, Walter MD Entered By: Madelaine BhatBurns, III, Walter on 07/16/2015 13:52:52 Gilman, [name] ([id number]) -------------------------------------------------------------------------------- Physical Exam Details Patient Name: Dominique MarkerOTEAT, [name] Date of Service: 07/16/2015 9:15 AM Medical Record Patient Account Number: [id number] Number: Treating RN: Clover MealyAfful, RN, BSN, Rita [birth year] [phone](64 y.o. Other Clinician: Date of Birth/Sex: Female) Treating BURNS III, Primary Care Physician: Aram BeechamSparks, Jeffrey Physician/Extender: Zollie BeckersWALTER Referring Physician: Bing PlumeSparks, Jeffrey Weeks in Treatment: 5 Constitutional . Pulse regular. Respirations normal and unlabored. Afebrile. . Notes Left fourth metatarsal head plantar ulceration improved. Moderate  surrounding callus. Full-thickness. No exposed deep structures. No probe to bone but close. No cellulitis. No palpable pedal pulses per her baseline. Dopplerable DP and PT. Left ABI 0.8. Electronic Signature(s) Signed: 07/16/2015 2:46:32 PM By: Madelaine BhatBurns, III, Walter MD Entered By: Madelaine BhatBurns, III, Walter on 07/16/2015 13:53:18 Dykes, [name] ([id number]) -------------------------------------------------------------------------------- Physician Orders Details Patient Name: Simonne MaffucciPOTEAT, Odeal K. Date of Service: 07/16/2015 9:15 AM Medical Record Patient Account Number: [id number] Number: Treating RN: Clover MealyAfful, RN, BSN, Rita [birth year] [phone](64 y.o. Other Clinician: Date of Birth/Sex: Female) Treating BURNS III, Primary Care Physician: Aram BeechamSparks, Jeffrey Physician/Extender: Zollie BeckersWALTER Referring Physician: Bing PlumeSparks, Jeffrey Weeks in Treatment: 5 Verbal / Phone Orders: Yes Clinician: Afful, RN, BSN, Rita Read Back and Verified: Yes Diagnosis Coding Wound Cleansing Wound #2 Left,Plantar Foot o Cleanse wound with mild soap and water o May Shower, gently pat wound dry prior to applying new dressing. Primary Wound Dressing Wound #2 Left,Plantar Foot o Aquacel Ag Secondary Dressing Wound #2 Left,Plantar Foot o Boardered Foam Dressing Dressing Change Frequency Wound #2 Left,Plantar Foot o Change dressing every other day. Follow-up Appointments Wound #2 Left,Plantar Foot o Return Appointment in 1 week. Off-Loading Wound #2 Left,Plantar Foot o Open toe surgical shoe with peg assist. Radiology o X-ray, foot - left foot oooo Simonne MaffucciOTEAT, Jenesys K. ([id number]) Electronic Signature(s) Signed: 07/16/2015 9:32:27 AM By: Elpidio EricAfful, Rita BSN, RN Signed: 07/16/2015 2:46:32 PM By: Madelaine BhatBurns, III, Walter MD Entered By: Elpidio EricAfful, Rita on 07/16/2015 09:32:27 Rout, [name] ([id number]) -------------------------------------------------------------------------------- Problem List Details Patient Name: Dominique MarkerOTEAT,  Eadie K. Date of Service: 07/16/2015 9:15 AM Medical Record Patient Account Number: [id number] Number: Treating RN: Clover MealyAfful, RN, BSN, Rita [birth year] [phone](64 y.o. 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 ([id number]) -------------------------------------------------------------------------------- Progress Note Details  Patient Name: JANEY, PETRON. Date of Service: 07/16/2015 9:15 AM Medical Record Patient Account Number: [id number] Number: Treating RN: Clover Mealy, RN, BSN, Rita 06/17/1951 [phone] y.o. 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 History of Present Illness (HPI) Very pleasant 64 year old with past medical history significant for type 2 diabetes (hemoglobin A1c 7.3 in June 2016), peripheral neuropathy, peripheral vascular disease, and coronary artery disease. 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 She developed an ulceration in between her left third and fourth metatarsal heads in April 2014 after stepping on a piece of glass. She underwent LLE angioplasty in June 2014 by Dr. [name]. She was treated at the [facility] wound clinic with hyperbaric oxygen therapy for a deep tissue infection, which grew MRSA and did not significantly improve with antibiotics. X-rays showed no evidence for underlying osteomyelitis. She also underwent placement of Dermagraft x7. She completed a total of 60 HBO treatments at [facility] in Feb 2015. In-chamber TCOM >74mmHg. Her ulcer worsened after stopping HBO and she completed an additional 30 treatments (90 total) with significant improvement (eventually healed). She underwent angioplasty of her left peroneal artery, tibioperoneal trunk, and superficial femoral artery on September 26, 2013. MRI on 10/12/13 showed no evidence for osteomyelitis or abscess. s/p Grafix x 5 and Epifix x 3 application with significant improvement. Significantly improved after starting to offload with knee walker, which she used at home but not in public for fear of falling. Also using darco shoe. Refused total contact cast secondary to fall history. Her ulcer was healed at her last clinic visit in Feb 2016. She returned to clinic after stepping on a piece of plastic in October 2016. She developed a painful callus and was seen by Dr. Alberteen Spindle in podiatry. The callus was debrided, and she was found to have an underlying ulceration. X-ray reportedly showed no evidence for underlying osteomyelitis. Records requested. She reports moderate pain with pressure. No claudication or rest pain. ABI 0.8. She has been applying silver alginate and offloading with a postop shoe. No fever or chills. Minimal bloody drainage. Objective Mettler, Azariya K. ([id number]) Constitutional Pulse regular. Respirations normal and unlabored. Afebrile. Vitals Time Taken: 9:19  AM, Height: 68 in, Weight: 154 lbs, BMI: 23.4, Temperature: 97.8 F, Pulse: 70 bpm, Respiratory Rate: 18 breaths/min, Blood Pressure: 168/74 mmHg. General Notes: Left fourth metatarsal head plantar ulceration improved. Moderate surrounding callus. Full- thickness. No exposed deep structures. No probe to bone but close. No cellulitis. No palpable pedal pulses per her baseline. Dopplerable DP and PT. Left ABI 0.8. Integumentary (Hair, Skin) Wound #2 status is Open. Original cause of wound was Trauma. The wound is located on the Left,Plantar Foot. The wound measures 0.4cm length x 0.4cm width x 0.5cm depth; 0.126cm^2 area and 0.063cm^3 volume. The wound is limited to skin breakdown. There is no tunneling or undermining noted. There is a small amount of serous drainage noted. The wound margin is flat and intact. There is large (67-100%) pink granulation within the wound bed. There is no necrotic tissue within the wound bed. The periwound skin appearance exhibited: Callus, Maceration, Moist. The periwound skin appearance did not exhibit: Crepitus, Excoriation, Fluctuance, Friable, Induration, Localized Edema, Rash, Scarring, Dry/Scaly, Atrophie Blanche, Cyanosis, Ecchymosis, Hemosiderin Staining, Mottled, Pallor,  Rubor, Erythema. Periwound temperature was noted as No Abnormality. The periwound has tenderness on palpation. Assessment Active Problems ICD-10 E11.621 - Type 2 diabetes mellitus with foot ulcer E11.40 - Type 2 diabetes mellitus with diabetic neuropathy, unspecified I70.245 - Atherosclerosis of native arteries of left leg with ulceration of other part of foot I25.10 - Atherosclerotic heart disease of native coronary artery without angina pectoris Chronic, recurrent left forefoot ulceration. Procedures Wound #2 Wound #2 is a Diabetic Wound/Ulcer of the Lower Extremity located on the Left,Plantar Foot . There was a Skin/Subcutaneous Tissue Debridement ([id number]) debridement with  total area of 0.16 sq cm Ginty, Nili K. ([id number]) performed by BURNS III, Melanie Crazier., MD. with the following instrument(s): Blade and Curette to remove Viable and Non-Viable tissue/material including Fat, Fibrin/Slough, Callus, and Subcutaneous after achieving pain control using Lidocaine 4% Topical Solution. A time out was conducted prior to the start of the procedure. A Minimum amount of bleeding was controlled with Pressure. The procedure was tolerated well with a pain level of 0 throughout and a pain level of 0 following the procedure. Post Debridement Measurements: 0.5cm length x 0.5cm width x 0.5cm depth; 0.098cm^3 volume. Post procedure Diagnosis Wound #2: Same as Pre-Procedure Plan Wound Cleansing: Wound #2 Left,Plantar Foot: Cleanse wound with mild soap and water May Shower, gently pat wound dry prior to applying new dressing. Primary Wound Dressing: Wound #2 Left,Plantar Foot: Aquacel Ag Secondary Dressing: Wound #2 Left,Plantar Foot: Boardered Foam Dressing Dressing Change Frequency: Wound #2 Left,Plantar Foot: Change dressing every other day. Follow-up Appointments: Wound #2 Left,Plantar Foot: Return Appointment in 1 week. Off-Loading: Wound #2 Left,Plantar Foot: Open toe surgical shoe with peg assist. Radiology ordered were: X-ray, foot - left foot Continue with silver alginate and offloading measures. X-ray to rule out underlying osteomyelitis. Electronic Signature(s) Signed: 07/16/2015 2:46:32 PM By: Madelaine Bhat MD Entered By: Madelaine Bhat on 07/16/2015 13:54:02 Rix, Martyna K. ([id number]) SADA, MAZZONI ([id number]) -------------------------------------------------------------------------------- SuperBill Details Patient Name: [name]. Date of Service: 07/16/2015 Medical Record Patient Account Number: [id number] Number: Treating RN: Clover Mealy, RN, BSN, Rita 15-Dec-1950 [phone] y.o. Other Clinician: Date of Birth/Sex: Female)  Treating BURNS III, Primary Care Physician: Aram Beecham Physician/Extender: Zollie Beckers Referring Physician: Bing Plume in Treatment: 5 Diagnosis Coding ICD-10 Codes Code Description E11.621 Type 2 diabetes mellitus with foot ulcer E11.40 Type 2 diabetes mellitus with diabetic neuropathy, unspecified I70.245 Atherosclerosis of native arteries of left leg with ulceration of other part of foot I25.10 Atherosclerotic heart disease of native coronary artery without angina pectoris Facility Procedures CPT4 Code: [id number] Description: 11042 - DEB SUBQ TISSUE 20 SQ CM/< ICD-10 Description Diagnosis E11.621 Type 2 diabetes mellitus with foot ulcer Modifier: Quantity: 1 Physician Procedures CPT4 Code: [id number] Description: 11042 - WC PHYS SUBQ TISS 20 SQ CM ICD-10 Description Diagnosis E11.621 Type 2 diabetes mellitus with foot ulcer Modifier: Quantity: 1 Electronic Signature(s) Signed: 07/16/2015 2:46:32 PM By: Madelaine Bhat MD Entered By: Madelaine Bhat on 07/16/2015 13:54:13

## 2015-07-22 ENCOUNTER — Ambulatory Visit: Payer: Medicare Other

## 2015-07-23 ENCOUNTER — Encounter: Payer: Medicare Other | Admitting: Surgery

## 2015-07-23 DIAGNOSIS — E11621 Type 2 diabetes mellitus with foot ulcer: Secondary | ICD-10-CM | POA: Diagnosis not present

## 2015-07-24 NOTE — Progress Notes (Signed)
Dominique, [name] ([id number]) Visit Report for 07/23/2015 Chief Complaint Document Details Patient Name: Dominique, [name]. Date of Service: 07/23/2015 9:15 AM Medical Record Patient Account Number: [id number] Number: Treating RN: [name] [birth year] (64 y.o. 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 Electronic Signature(s) Signed: 07/23/2015 3:49:04 PM By: Dominique Bhat MD Entered By: [name] on 07/23/2015 10:14:57 [name], [name] Kitchen ([id number]) -------------------------------------------------------------------------------- Debridement Details Patient Name: Dominique Marker K. Date of Service: 07/23/2015 9:15 AM Medical Record Patient Account Number: [id number] Number: Treating RN: [name] October 04, 1950 (64 y.o. Other Clinician: Date of Birth/Sex: Female) Treating BURNS III, Primary Care Physician: Aram Beecham Physician/Extender: [name] Referring Physician: Bing [name] in Treatment: 6 Debridement Performed for Wound #2 Left,Plantar Foot Assessment: Performed By: Physician BURNS III, [name]., MD Debridement: Debridement Pre-procedure Yes Verification/Time Out Taken: Start Time: 09:52 Pain Control: Lidocaine 4% Topical Solution Level: Skin/Subcutaneous Tissue Total Area Debrided (L x 0.4 (cm) x 0.4 (cm) = 0.16 (cm) W): Tissue and other Viable, Non-Viable, Callus, Fat, Fibrin/Slough, Subcutaneous material debrided: Instrument: Blade, Curette Bleeding: Minimum Hemostasis Achieved: Silver Nitrate End Time: 09:55 Procedural Pain: 0 Post Procedural Pain: 0 Response to Treatment: Procedure was tolerated well Post Debridement Measurements of Total  Wound Length: (cm) 0.4 Width: (cm) 0.4 Depth: (cm) 0.4 Volume: (cm) 0.05 Post Procedure Diagnosis Same as Pre-procedure Electronic Signature(s) Signed: 07/23/2015 3:49:04 PM By: Dominique Bhat MD Signed: 07/23/2015 5:20:32 PM By: [name] Entered By: [name] on 07/23/2015 10:14:44 [name], [name] ([id number]) -------------------------------------------------------------------------------- HPI Details Patient Name: Dominique Marker K. Date of Service: 07/23/2015 9:15 AM Medical Record Patient Account Number: [id number] Number: Treating RN: [name] 11-26-1950 (64 y.o. Other Clinician: Date of Birth/Sex: Female) Treating BURNS III, Primary Care Physician: Aram Beecham Physician/Extender: [name] Referring Physician: Bing [name] in Treatment: 6 History of Present Illness HPI Description: Very pleasant 64 year old with past medical history significant for type 2 diabetes (hemoglobin A1c 7.3 in June 2016), peripheral neuropathy, peripheral vascular disease, and coronary artery disease. 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 She developed an ulceration in between her left third and fourth metatarsal heads in April 2014 after stepping on a piece of glass. She underwent LLE angioplasty in June 2014 by Dr. Gilda [name]. She was treated at the [facility] wound clinic with hyperbaric oxygen therapy for a deep tissue infection, which grew MRSA and did not significantly improve with antibiotics. X-rays showed no evidence for underlying osteomyelitis. She also underwent placement of Dermagraft x7. She completed a total of 60 HBO treatments at [facility] in Feb 2015. In-chamber TCOM >773mmHg. Her ulcer worsened after stopping HBO and she completed an additional 30 treatments (90 total) with significant improvement (eventually healed). She underwent angioplasty of her left  peroneal artery, tibioperoneal trunk, and superficial femoral artery on September 26, 2013. MRI on 10/12/13 showed no evidence for osteomyelitis or abscess. s/p Grafix x 5 and Epifix x 3 application with significant improvement. Significantly improved after starting to offload with knee walker, which she used at home but not in public for fear of falling. Also using darco shoe. Refused total contact cast secondary to fall history. Her ulcer was healed at her last clinic visit in Feb 2016. She returned to  clinic after stepping on a piece of plastic in October 2016. She developed a painful callus and was seen by Dr. Alberteen [name] in podiatry. The callus was debrided, and she was found to have an underlying ulceration. X-ray reportedly showed no evidence for underlying osteomyelitis. Records requested. Repeat x-ray 07/16/2015 showed no evidence for osteomyelitis. She reports moderate pain with pressure. No claudication or rest pain. ABI 0.8. She has been applying silver alginate and offloading with a knee walker when possible and a postop shoe and new orthotics. No fever or chills. Minimal bloody drainage. Electronic Signature(s) Signed: 07/23/2015 3:49:04 PM By: Dominique BhatBurns, III, Walter MD Entered By: Dominique BhatBurns, III, [name] on 07/23/2015 10:17:45 [name], [name] ([id number]) -------------------------------------------------------------------------------- Physical Exam Details Patient Name: Dominique MarkerOTEAT, Kayliana K. Date of Service: 07/23/2015 9:15 AM Medical Record Patient Account Number: [id number] Number: Treating RN: Dominique SitesDorthy, [name] 1950-12-07 (64 y.o. Other Clinician: Date of Birth/Sex: Female) Treating BURNS III, Primary Care Physician: Aram BeechamSparks, Jeffrey Physician/Extender: Dominique BeckersWALTER Referring Physician: Bing PlumeSparks, Jeffrey Weeks in Treatment: 6 Constitutional . Pulse regular. Respirations normal and unlabored. Afebrile. . Notes Left fourth metatarsal head plantar ulceration improved. Moderate surrounding  callus. Full-thickness. No exposed deep structures. No probe to bone but close. No cellulitis. No palpable pedal pulses per her baseline. Dopplerable DP and PT. Left ABI 0.8. Electronic Signature(s) Signed: 07/23/2015 3:49:04 PM By: Dominique BhatBurns, III, Walter MD Entered By: Dominique BhatBurns, III, [name] on 07/23/2015 10:16:56 Thibeaux, [name] ([id number]) -------------------------------------------------------------------------------- Physician Orders Details Patient Name: [name], [name] Date of Service: 07/23/2015 9:15 AM Medical Record Patient Account Number: [id number] Number: Treating RN: Dominique SitesDorthy, [name] 1950-12-07 (64 y.o. Other Clinician: Date of Birth/Sex: Female) Treating BURNS III, Primary Care Physician: Aram BeechamSparks, Jeffrey Physician/Extender: Dominique BeckersWALTER Referring Physician: Bing PlumeSparks, Jeffrey Weeks in Treatment: 6 Verbal / Phone Orders: Yes Clinician: Curtis Sitesorthy, [name] Read Back and Verified: Yes Diagnosis Coding Wound Cleansing Wound #2 Left,Plantar Foot o Cleanse wound with mild soap and water o May Shower, gently pat wound dry prior to applying new dressing. Primary Wound Dressing Wound #2 Left,Plantar Foot o Aquacel Ag Secondary Dressing Wound #2 Left,Plantar Foot o Boardered Foam Dressing Dressing Change Frequency Wound #2 Left,Plantar Foot o Change dressing every other day. Follow-up Appointments Wound #2 Left,Plantar Foot o Return Appointment in 1 week. Off-Loading Wound #2 Left,Plantar Foot o Open toe surgical shoe with peg assist. Notes order epifix 14mm Electronic Signature(s) Signed: 07/23/2015 3:49:04 PM By: Dominique BhatBurns, III, Walter MD Signed: 07/23/2015 5:20:32 PM By: Dominique Sitesorthy, [name] Entered By: Dominique Sitesorthy, [name] on 07/23/2015 09:56:34 Casebier, [place] K. ([id number]) Dominique MaffucciOTEAT, Ondrea K. ([id number]) -------------------------------------------------------------------------------- Problem List Details Patient Name: Dominique MarkerOTEAT, Tiondra K. Date of Service: 07/23/2015  9:15 AM Medical Record Patient Account Number: [id number] Number: Treating RN: Dominique SitesDorthy, [name] 1950-12-07 (64 y.o. 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 ([id number]) -------------------------------------------------------------------------------- Progress Note Details Patient Name: Dominique MarkerPOTEAT, [name] Date of Service: 07/23/2015 9:15 AM Medical Record Patient Account Number: [id number] Number:  Treating RN: [name] 04/19/1951 (64 y.o. 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 History of Present Illness (HPI) Very pleasant 64 year old with past medical history significant for type 2 diabetes (hemoglobin A1c 7.3 in June 2016), peripheral neuropathy, peripheral vascular disease, and coronary artery disease. 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 She  developed an ulceration in between her left third and fourth metatarsal heads in April 2014 after stepping on a piece of glass. She underwent LLE angioplasty in June 2014 by Dr. Gilda [name]. She was treated at the [facility] wound clinic with hyperbaric oxygen therapy for a deep tissue infection, which grew MRSA and did not significantly improve with antibiotics. X-rays showed no evidence for underlying osteomyelitis. She also underwent placement of Dermagraft x7. She completed a total of 60 HBO treatments at [facility] in Feb 2015. In-chamber TCOM >740mmHg. Her ulcer worsened after stopping HBO and she completed an additional 30 treatments (90 total) with significant improvement (eventually healed). She underwent angioplasty of her left peroneal artery, tibioperoneal trunk, and superficial femoral artery on September 26, 2013. MRI on 10/12/13 showed no evidence for osteomyelitis or abscess. s/p Grafix x 5 and Epifix x 3 application with significant improvement. Significantly improved after starting to offload with knee walker, which she used at home but not in public for fear of falling. Also using darco shoe. Refused total contact cast secondary to fall history. Her ulcer was healed at her last clinic visit in Feb 2016. She returned to clinic after stepping on a piece of plastic in October 2016. She developed a painful callus and was seen by Dr. Alberteen Spindle in podiatry. The callus was debrided, and she was found to have an underlying ulceration. X-ray reportedly showed no evidence for underlying osteomyelitis. Records requested. Repeat x-ray 07/16/2015 showed no evidence for osteomyelitis. She reports moderate pain with pressure. No claudication or rest pain. ABI 0.8. She has been applying silver alginate and offloading with a knee walker when possible and a postop shoe and new orthotics. No fever or chills. Minimal bloody drainage. [name], THURLOW K. ([id number]) Objective Constitutional Pulse regular.  Respirations normal and unlabored. Afebrile. Vitals Time Taken: 9:33 AM, Height: 68 in, Weight: 154 lbs, BMI: 23.4, Temperature: 97.9 F, Pulse: 64 bpm, Respiratory Rate: 18 breaths/min, Blood Pressure: 151/59 mmHg. General Notes: Left fourth metatarsal head plantar ulceration improved. Moderate surrounding callus. Full- thickness. No exposed deep structures. No probe to bone but close. No cellulitis. No palpable pedal pulses per her baseline. Dopplerable DP and PT. Left ABI 0.8. Integumentary (Hair, Skin) Wound #2 status is Open. Original cause of wound was Trauma. The wound is located on the Left,Plantar Foot. The wound measures 0.3cm length x 0.4cm width x 0.4cm depth; 0.094cm^2 area and 0.038cm^3 volume. The wound is limited to skin breakdown. There is no tunneling or undermining noted. There is a small amount of serous drainage noted. The wound margin is flat and intact. There is large (67-100%) pink granulation within the wound bed. There is no necrotic tissue within the wound bed. The periwound skin appearance exhibited: Callus, Moist. The periwound skin appearance did not exhibit: Crepitus, Excoriation, Fluctuance, Friable, Induration, Localized Edema, Rash, Scarring, Dry/Scaly, Maceration, Atrophie Blanche, Cyanosis, Ecchymosis, Hemosiderin Staining, Mottled, Pallor, Rubor, Erythema. Periwound temperature  was noted as No Abnormality. The periwound has tenderness on palpation. 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 Procedures SHACORIA, LATIF K. ([id number]) Wound #2 Wound #2 is a Diabetic Wound/Ulcer of the  Lower Extremity located on the Left,Plantar Foot . There was a Skin/Subcutaneous Tissue Debridement ([id number]) debridement with total area of 0.16 sq cm performed by BURNS III, [name]., MD. with the following instrument(s): Blade and Curette to remove Viable and Non-Viable tissue/material including Fat, Fibrin/Slough, Callus, and Subcutaneous after achieving pain control using Lidocaine 4% Topical Solution. A time out was conducted prior to the start of the procedure. A Minimum amount of bleeding was controlled with Silver Nitrate. The procedure was tolerated well with a pain level of 0 throughout and a pain level of 0 following the procedure. Post Debridement Measurements: 0.4cm length x 0.4cm width x 0.4cm depth; 0.05cm^3 volume. Post procedure Diagnosis Wound #2: Same as Pre-Procedure Plan Wound Cleansing: Wound #2 Left,Plantar Foot: Cleanse wound with mild soap and water May Shower, gently pat wound dry prior to applying new dressing. Primary Wound Dressing: Wound #2 Left,Plantar Foot: Aquacel Ag Secondary Dressing: Wound #2 Left,Plantar Foot: Boardered Foam Dressing Dressing Change Frequency: Wound #2 Left,Plantar Foot: Change dressing every other day. Follow-up Appointments: Wound #2 Left,Plantar Foot: Return Appointment in 1 week. Off-Loading: Wound #2 Left,Plantar Foot: Open toe surgical shoe with peg assist. General Notes: order epifix 14mm Continue with silver alginate and offloading measures. Order 14 mm Epifix for next week. Electronic Signature(s) FRANCIE, KEELING ([id number]) Signed: 07/23/2015 3:49:04 PM By: Dominique Bhat MD Entered By: [name] on 07/23/2015 10:18:31 Cerro, [name] ([id number]) -------------------------------------------------------------------------------- SuperBill Details Patient Name: [name]. Date of Service: 07/23/2015 Medical Record Patient Account Number: [id number] Number: Treating RN: [name] 07-25-1951 (64 y.o. Other Clinician: Date of Birth/Sex: Female) Treating BURNS III, Primary Care Physician: Aram Beecham Physician/Extender: [name] Referring Physician: Bing [name] in Treatment: 6 Diagnosis Coding ICD-10 Codes Code Description E11.621 Type 2 diabetes mellitus with foot ulcer E11.40 Type 2 diabetes mellitus with diabetic neuropathy, unspecified I70.245 Atherosclerosis of native arteries of left leg with ulceration of other part of foot I25.10 Atherosclerotic heart disease of native coronary artery without angina pectoris Facility Procedures CPT4 Code: [id number] Description: 11042 - DEB SUBQ TISSUE 20 SQ CM/< ICD-10 Description Diagnosis E11.621 Type 2 diabetes mellitus with foot ulcer Modifier: Quantity: 1 Physician Procedures CPT4 Code: [id number] Description: 11042 - WC PHYS SUBQ TISS 20 SQ CM ICD-10 Description Diagnosis E11.621 Type 2 diabetes mellitus with foot ulcer Modifier: Quantity: 1 Electronic Signature(s) Signed: 07/23/2015 3:49:04 PM By: Dominique Bhat MD Entered By: [name] on 07/23/2015 10:18:43

## 2015-07-24 NOTE — Progress Notes (Signed)
IRINI, LEET ([id number]) Visit Report for 07/23/2015 Arrival Information Details Patient Name: [name], [name]. Date of Service: 07/23/2015 9:15 AM Medical Record Number: [id number] Patient Account Number: [id number] Date of Birth/Sex: 07-05-1951 (64 y.o. Female) Treating RN: Curtis Sites Primary Care Physician: Aram Beecham Other Clinician: Referring Physician: Aram Beecham Treating Physician/Extender: BURNS III, Regis Bill in Treatment: 6 Visit Information History Since Last Visit Added or deleted any medications: No Patient Arrived: Ambulatory Any new allergies or adverse reactions: No Arrival Time: 09:31 Had a fall or experienced change in No Accompanied By: self activities of daily living that may affect Transfer Assistance: None risk of falls: Patient Identification Verified: Yes Signs or symptoms of abuse/neglect since last No Secondary Verification Process Yes visito Completed: Hospitalized since last visit: No Patient Has Alerts: Yes Pain Present Now: No Patient Alerts: Patient on Blood Thinner DMII warfarin Electronic Signature(s) Signed: 07/23/2015 5:20:32 PM By: Curtis Sites Entered By: Curtis Sites on 07/23/2015 09:31:46 Bhola, Nathania Kirtland [name] ([id number]) -------------------------------------------------------------------------------- Encounter Discharge Information Details Patient Name: Dominique Maffucci. Date of Service: 07/23/2015 9:15 AM Medical Record Number: [id number] Patient Account Number: [id number] Date of Birth/Sex: [birth year] (64 y.o. Female) Treating RN: Curtis Sites Primary Care Physician: Aram Beecham Other Clinician: Referring Physician: Aram Beecham Treating Physician/Extender: BURNS III, Regis Bill in Treatment: 6 Encounter Discharge Information Items Discharge Pain Level: 0 Discharge Condition: Stable Ambulatory Status: Ambulatory Discharge Destination: Home Transportation: Private Auto Accompanied By:  self Schedule Follow-up Appointment: Yes Medication Reconciliation completed and provided to Patient/Care No Mardy Lucier: Provided on Clinical Summary of Care: 07/23/2015 Form Type Recipient Paper Patient PP Electronic Signature(s) Signed: 07/23/2015 10:11:45 AM By: Gwenlyn Perking Entered By: Gwenlyn Perking on 07/23/2015 10:11:45 Gignac, Priscilla K. ([id number]) -------------------------------------------------------------------------------- Lower Extremity Assessment Details Patient Name: Dominique Marker K. Date of Service: 07/23/2015 9:15 AM Medical Record Number: [id number] Patient Account Number: [id number] Date of Birth/Sex: [birth year] (64 y.o. Female) Treating RN: Curtis Sites Primary Care Physician: Aram Beecham Other Clinician: Referring Physician: Aram Beecham Treating Physician/Extender: BURNS III, Regis Bill in Treatment: 6 Edema Assessment Assessed: [Left: No] [Right: No] Edema: [Left: N] [Right: o] Vascular Assessment Pulses: Posterior Tibial Dorsalis Pedis Palpable: [Left:Yes] Extremity colors, hair growth, and conditions: Extremity Color: [Left:Normal] Hair Growth on Extremity: [Left:Yes] Temperature of Extremity: [Left:Warm] Capillary Refill: [Left:< 3 seconds] Electronic Signature(s) Signed: 07/23/2015 5:20:32 PM By: Curtis Sites Entered By: Curtis Sites on 07/23/2015 09:42:49 Saavedra, Velvia K. ([id number]) -------------------------------------------------------------------------------- Multi Wound Chart Details Patient Name: Dominique Marker K. Date of Service: 07/23/2015 9:15 AM Medical Record Number: [id number] Patient Account Number: [id number] Date of Birth/Sex: 1950/12/03 (64 y.o. Female) Treating RN: Curtis Sites Primary Care Physician: Aram Beecham Other Clinician: Referring Physician: Aram Beecham Treating Physician/Extender: BURNS III, Regis Bill in Treatment: 6 Vital Signs Height(in): 68 Pulse(bpm): 64 Weight(lbs): 154  Blood Pressure 151/59 (mmHg): Body Mass Index(BMI): 23 Temperature(F): 97.9 Respiratory Rate 18 (breaths/min): Photos: [2:No Photos] [N/A:N/A] Wound Location: [2:Left Foot - Plantar] [N/A:N/A] Wounding Event: [2:Trauma] [N/A:N/A] Primary Etiology: [2:Diabetic Wound/Ulcer of the Lower Extremity] [N/A:N/A] Comorbid History: [2:Cataracts, Angina, Arrhythmia, Coronary Artery Disease, Hypertension, Peripheral Arterial Disease, Type II Diabetes] [N/A:N/A] Date Acquired: [2:05/21/2015] [N/A:N/A] Weeks of Treatment: [2:6] [N/A:N/A] Wound Status: [2:Open] [N/A:N/A] Measurements L x W x D 0.3x0.4x0.4 [N/A:N/A] (cm) Area (cm) : [2:0.094] [N/A:N/A] Volume (cm) : [2:0.038] [N/A:N/A] % Reduction in Area: [2:-49.20%] [N/A:N/A] % Reduction in Volume: -52.00% [N/A:N/A] Classification: [2:Grade 1] [N/A:N/A] Exudate Amount: [2:Small] [N/A:N/A] Exudate Type: [2:Serous] [N/A:N/A] Exudate Color: [2:amber] [N/A:N/A] Wound Margin: [2:Flat and Intact] [N/A:N/A] Granulation Amount: [2:Large (  67-100%)] [N/A:N/A] Granulation Quality: [2:Pink] [N/A:N/A] Necrotic Amount: [2:None Present (0%)] [N/A:N/A] Exposed Structures: [2:Fascia: No Fat: No] [N/A:N/A] Tendon: No Muscle: No Joint: No Bone: No Limited to Skin Breakdown Epithelialization: Small (1-33%) N/A N/A Periwound Skin Texture: Callus: Yes N/A N/A Edema: No Excoriation: No Induration: No Crepitus: No Fluctuance: No Friable: No Rash: No Scarring: No Periwound Skin Moist: Yes N/A N/A Moisture: Maceration: No Dry/Scaly: No Periwound Skin Color: Atrophie Blanche: No N/A N/A Cyanosis: No Ecchymosis: No Erythema: No Hemosiderin Staining: No Mottled: No Pallor: No Rubor: No Temperature: No Abnormality N/A N/A Tenderness on Yes N/A N/A Palpation: Wound Preparation: Ulcer Cleansing: N/A N/A Rinsed/Irrigated with Saline Topical Anesthetic Applied: Other: lidocaine 4% Treatment Notes Electronic Signature(s) Signed:  07/23/2015 5:20:32 PM By: Curtis Sites Entered By: Curtis Sites on 07/23/2015 09:43:44 [name], [name] ([id number]) -------------------------------------------------------------------------------- Multi-Disciplinary Care Plan Details Patient Name: [name], [name]. Date of Service: 07/23/2015 9:15 AM Medical Record Number: [id number] Patient Account Number: [id number] Date of Birth/Sex: 14-Mar-1951 (64 y.o. Female) Treating RN: Curtis Sites Primary Care Physician: Aram Beecham Other Clinician: Referring Physician: Aram Beecham Treating Physician/Extender: BURNS III, Regis Bill in Treatment: 6 Active Inactive Wound/Skin Impairment Nursing Diagnoses: Impaired tissue integrity Goals: Patient/caregiver will verbalize understanding of skin care regimen Date Initiated: 06/11/2015 Goal Status: Active Ulcer/skin breakdown will have a volume reduction of 30% by week 4 Date Initiated: 06/11/2015 Goal Status: Active Ulcer/skin breakdown will have a volume reduction of 50% by week 8 Date Initiated: 06/11/2015 Goal Status: Active Ulcer/skin breakdown will have a volume reduction of 80% by week 12 Date Initiated: 06/11/2015 Goal Status: Active Ulcer/skin breakdown will heal within 14 weeks Date Initiated: 06/11/2015 Goal Status: Active Interventions: Assess patient/caregiver ability to obtain necessary supplies Assess ulceration(s) every visit Notes: Electronic Signature(s) Signed: 07/23/2015 5:20:32 PM By: Curtis Sites Entered By: Curtis Sites on 07/23/2015 09:43:36 [name], [name] ([id number]) -------------------------------------------------------------------------------- Patient/Caregiver Education Details Patient Name: Dominique Maffucci. Date of Service: 07/23/2015 9:15 AM Medical Record Number: [id number] Patient Account Number: [id number] Date of Birth/Gender: September 22, 1950 (64 y.o. Female) Treating RN: Curtis Sites Primary Care Physician: Aram Beecham Other  Clinician: Referring Physician: Aram Beecham Treating Physician/Extender: BURNS III, Regis Bill in Treatment: 6 Education Assessment Education Provided To: Patient Education Topics Provided Wound/Skin Impairment: Handouts: Other: wound care as ordered Methods: Demonstration, Explain/Verbal Responses: State content correctly Electronic Signature(s) Signed: 07/23/2015 5:20:32 PM By: Curtis Sites Entered By: Curtis Sites on 07/23/2015 10:09:47 [name], [name] ([id number]) -------------------------------------------------------------------------------- Wound Assessment Details Patient Name: Dominique Marker K. Date of Service: 07/23/2015 9:15 AM Medical Record Number: [id number] Patient Account Number: [id number] Date of Birth/Sex: 10-12-1950 (64 y.o. Female) Treating RN: Curtis Sites Primary Care Physician: Aram Beecham Other Clinician: Referring Physician: Aram Beecham Treating Physician/Extender: BURNS III, Regis Bill in Treatment: 6 Wound Status Wound Number: 2 Primary Diabetic Wound/Ulcer of the Lower Etiology: Extremity Wound Location: Left Foot - Plantar Wound Open Wounding Event: Trauma Status: Date Acquired: 05/21/2015 Comorbid Cataracts, Angina, Arrhythmia, Weeks Of Treatment: 6 History: Coronary Artery Disease, Hypertension, Clustered Wound: No Peripheral Arterial Disease, Type II Diabetes Photos Photo Uploaded By: Curtis Sites on 07/23/2015 10:23:18 Wound Measurements Length: (cm) 0.3 Width: (cm) 0.4 Depth: (cm) 0.4 Area: (cm) 0.094 Volume: (cm) 0.038 % Reduction in Area: -49.2% % Reduction in Volume: -52% Epithelialization: Small (1-33%) Tunneling: No Undermining: No Wound Description Classification: Grade 1 Wound Margin: Flat and Intact Exudate Amount: Small Exudate Type: Serous Exudate Color: amber Foul Odor After Cleansing: No Wound Bed Granulation Amount: Large (67-100%) Exposed Structure Granulation  Quality:  Pink Fascia Exposed: No Necrotic Amount: None Present (0%) Fat Layer Exposed: No [name], [name] ([id number]) Tendon Exposed: No Muscle Exposed: No Joint Exposed: No Bone Exposed: No Limited to Skin Breakdown Periwound Skin Texture Texture Color No Abnormalities Noted: No No Abnormalities Noted: No Callus: Yes Atrophie Blanche: No Crepitus: No Cyanosis: No Excoriation: No Ecchymosis: No Fluctuance: No Erythema: No Friable: No Hemosiderin Staining: No Induration: No Mottled: No Localized Edema: No Pallor: No Rash: No Rubor: No Scarring: No Temperature / Pain Moisture Temperature: No Abnormality No Abnormalities Noted: No Tenderness on Palpation: Yes Dry / Scaly: No Maceration: No Moist: Yes Wound Preparation Ulcer Cleansing: Rinsed/Irrigated with Saline Topical Anesthetic Applied: Other: lidocaine 4%, Treatment Notes Wound #2 (Left, Plantar Foot) 1. Cleansed with: Clean wound with Normal Saline 2. Anesthetic Topical Lidocaine 4% cream to wound bed prior to debridement 4. Dressing Applied: Aquacel Ag 5. Secondary Dressing Applied Bordered Foam Dressing Notes darco shoe with peg assist, comfeel plus Electronic Signature(s) Signed: 07/23/2015 5:20:32 PM By: Curtis Sitesorthy, Joanna Entered By: Curtis Sitesorthy, Joanna on 07/23/2015 09:43:29 [name], [name] ([id number]) -------------------------------------------------------------------------------- Vitals Details Patient Name: [name], [name] Date of Service: 07/23/2015 9:15 AM Medical Record Number: [id number] Patient Account Number: [id number] Date of Birth/Sex: 1951-01-11 (64 y.o. Female) Treating RN: Curtis Sitesorthy, Joanna Primary Care Physician: Aram BeechamSparks, Jeffrey Other Clinician: Referring Physician: Aram BeechamSparks, Jeffrey Treating Physician/Extender: BURNS III, Regis BillWALTER Weeks in Treatment: 6 Vital Signs Time Taken: 09:33 Temperature (F): 97.9 Height (in): 68 Pulse (bpm): 64 Weight (lbs): 154 Respiratory Rate (breaths/min):  18 Body Mass Index (BMI): 23.4 Blood Pressure (mmHg): 151/59 Reference Range: 80 - 120 mg / dl Electronic Signature(s) Signed: 07/23/2015 5:20:32 PM By: Curtis Sitesorthy, Joanna Entered By: Curtis Sitesorthy, Joanna on 07/23/2015 09:34:35

## 2015-07-30 ENCOUNTER — Encounter: Payer: Medicare Other | Admitting: Surgery

## 2015-07-30 ENCOUNTER — Ambulatory Visit (INDEPENDENT_AMBULATORY_CARE_PROVIDER_SITE_OTHER): Payer: Medicare Other

## 2015-07-30 DIAGNOSIS — I480 Paroxysmal atrial fibrillation: Secondary | ICD-10-CM

## 2015-07-30 DIAGNOSIS — Z5181 Encounter for therapeutic drug level monitoring: Secondary | ICD-10-CM

## 2015-07-30 DIAGNOSIS — I2699 Other pulmonary embolism without acute cor pulmonale: Secondary | ICD-10-CM

## 2015-07-30 DIAGNOSIS — E11621 Type 2 diabetes mellitus with foot ulcer: Secondary | ICD-10-CM | POA: Diagnosis not present

## 2015-07-30 LAB — POCT INR: INR: 3.2

## 2015-07-30 NOTE — Progress Notes (Signed)
Dominique, [name] ([id number]) Visit Report for 07/30/2015 Chief Complaint Document Details Patient Name: [name], [name]. Date of Service: 07/30/2015 9:15 AM Medical Record Patient Account Number: [id number] Number: Treating RN: Curtis Sites 1950-10-21 (64 y.o. Other Clinician: Date of Birth/Sex: Female) Treating BURNS III, Primary Care Physician: Aram Beecham Physician/Extender: Zollie Beckers Referring Physician: Bing Plume in Treatment: 7 Information Obtained from: Patient Chief Complaint Recurrent left 4th metatarsal head diabetic foot ulceration. Electronic Signature(s) Signed: 07/30/2015 1:01:02 PM By: Madelaine Bhat MD Entered By: Madelaine Bhat on 07/30/2015 12:21:01 Creek, [name] ([id number]) -------------------------------------------------------------------------------- Cellular or Tissue Based Product Details Patient Name: Dominique, [name]. Date of Service: 07/30/2015 9:15 AM Medical Record Patient Account Number: [id number] Number: Treating RN: Curtis Sites 01-Sep-1950 (64 y.o. Other Clinician: Date of Birth/Sex: Female) Treating BURNS III, Primary Care Physician: Aram Beecham Physician/Extender: Zollie Beckers Referring Physician: Bing Plume in Treatment: 7 Cellular or Tissue Based Wound #2 Left,Plantar Foot Product Type Applied to: Performed By: Physician BURNS III, Melanie Crazier., MD Cellular or Tissue Based Epifix Product Type: Time-Out Taken: Yes Location: genitalia / hands / feet / multiple digits Wound Size (sq cm): 0.06 Product Size (sq cm): 2 Waste Size (sq cm): 1 Waste Reason: wound size Amount of Product Applied (sq cm): 1 Lot #: [id number] Expiration Date: 02/07/2020 Fenestrated: No Reconstituted: Yes Solution Type: saline Solution Amount: 1 Lot #: [id number] Solution Expiration 03/09/2017 Date: Secured: Yes Secured With: Steri-Strips Dressing Applied: Yes Primary Dressing: mepitel  one Procedural Pain: 0 Post Procedural Pain: 0 Response to Treatment: Procedure was tolerated well Post Procedure Diagnosis Same as Pre-procedure Electronic Signature(s) Signed: 07/30/2015 1:01:02 PM By: Madelaine Bhat MD Entered By: Madelaine Bhat on 07/30/2015 12:20:52 Alicea, Monie K. ([id number]) Newsom, Alondria K. ([id number]) -------------------------------------------------------------------------------- HPI Details Patient Name: Dominique Marker K. Date of Service: 07/30/2015 9:15 AM Medical Record Patient Account Number: [id number] Number: Treating RN: Curtis Sites 05/20/1951 (64 y.o. Other Clinician: Date of Birth/Sex: Female) Treating BURNS III, Primary Care Physician: Aram Beecham Physician/Extender: Zollie Beckers Referring Physician: Bing Plume in Treatment: 7 History of Present Illness HPI Description: Very pleasant 64 year old with past medical history significant for type 2 diabetes (hemoglobin A1c 7.3 in June 2016), peripheral neuropathy, peripheral vascular disease, and coronary artery disease. 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 She developed an ulceration in between her left third and fourth metatarsal heads in April 2014 after stepping on a piece of glass. She underwent LLE angioplasty in June 2014 by Dr. [name]. She was treated at the [facility] wound clinic with hyperbaric oxygen therapy for a deep tissue infection, which grew MRSA and did not significantly improve with antibiotics. X-rays showed no evidence for underlying osteomyelitis. She also underwent placement of Dermagraft x7. She completed a total of 60 HBO treatments at [facility] in Feb 2015. In-chamber TCOM >718mmHg. Her ulcer worsened after stopping HBO and she completed an additional 30 treatments (90 total) with significant improvement (eventually healed). She underwent angioplasty of her  left peroneal artery, tibioperoneal trunk, and superficial femoral artery on September 26, 2013. MRI on 10/12/13 showed no evidence for osteomyelitis or abscess. s/p Grafix x 5 and Epifix x 3 application with significant improvement. Significantly improved after starting to offload with knee walker, which she used at home but not in public for fear of falling. Also using darco shoe. Refused total contact cast secondary to fall history. Her ulcer was healed  at her last clinic visit in Feb 2016. She returned to clinic after stepping on a piece of plastic in October 2016. She developed a painful callus and was seen by Dr. Alberteen Spindle in podiatry. The callus was debrided, and she was found to have an underlying ulceration. X-ray reportedly showed no evidence for underlying osteomyelitis. Records requested. Repeat x-ray 07/16/2015 showed no evidence for osteomyelitis. She reports moderate pain with pressure. No claudication or rest pain. ABI 0.8. She has been applying silver alginate and offloading with a knee walker when possible and a postop shoe and new orthotics. No fever or chills. Minimal bloody drainage. Electronic Signature(s) Signed: 07/30/2015 1:01:02 PM By: Madelaine Bhat MD Entered By: Madelaine Bhat on 07/30/2015 12:21:15 [name], [name] ([id number]) -------------------------------------------------------------------------------- Callus Pairing Details Patient Name: Dominique Marker K. Date of Service: 07/30/2015 9:15 AM Medical Record Patient Account Number: [id number] Number: Treating RN: Curtis Sites 1951/07/14 (64 y.o. Other Clinician: Date of Birth/Sex: Female) Treating BURNS III, Primary Care Physician: Aram Beecham Physician/Extender: Zollie Beckers Referring Physician: Bing Plume in Treatment: 7 Procedure Performed for: Wound #2 Left,Plantar Foot Performed By: Physician BURNS III, Melanie Crazier., MD Post Procedure Diagnosis Same as Pre-procedure Electronic  Signature(s) Signed: 07/30/2015 1:01:02 PM By: Madelaine Bhat MD Entered By: Madelaine Bhat on 07/30/2015 12:20:43 [name], [name] ([id number]) -------------------------------------------------------------------------------- Physical Exam Details Patient Name: EVAH, RASHID K. Date of Service: 07/30/2015 9:15 AM Medical Record Patient Account Number: [id number] Number: Treating RN: Curtis Sites 09-25-1950 (64 y.o. Other Clinician: Date of Birth/Sex: Female) Treating BURNS III, Primary Care Physician: Aram Beecham Physician/Extender: Zollie Beckers Referring Physician: Bing Plume in Treatment: 7 Constitutional . Pulse regular. Respirations normal and unlabored. Afebrile. . Notes Left fourth metatarsal head plantar ulceration improved. Moderate surrounding callus. Full-thickness. No exposed deep structures. No probe to bone but close. No cellulitis. No palpable pedal pulses per her baseline. Dopplerable DP and PT. Left ABI 0.8. Epifix #1 applied. Electronic Signature(s) Signed: 07/30/2015 1:01:02 PM By: Madelaine Bhat MD Entered By: Madelaine Bhat on 07/30/2015 12:21:40 Alban, [name] ([id number]) -------------------------------------------------------------------------------- Physician Orders Details Patient Name: [name]. Date of Service: 07/30/2015 9:15 AM Medical Record Patient Account Number: [id number] Number: Treating RN: Curtis Sites 08/08/1951 (64 y.o. Other Clinician: Date of Birth/Sex: Female) Treating BURNS III, Primary Care Physician: Aram Beecham Physician/Extender: Zollie Beckers Referring Physician: Bing Plume in Treatment: 7 Verbal / Phone Orders: No Diagnosis Coding ICD-10 Coding Code Description E11.621 Type 2 diabetes mellitus with foot ulcer E11.40 Type 2 diabetes mellitus with diabetic neuropathy, unspecified I70.245 Atherosclerosis of native arteries of left leg with ulceration of  other part of foot I25.10 Atherosclerotic heart disease of native coronary artery without angina pectoris Wound Cleansing Wound #2 Left,Plantar Foot o Clean wound with Normal Saline. Anesthetic Wound #2 Left,Plantar Foot o Topical Lidocaine 4% cream applied to wound bed prior to debridement Primary Wound Dressing Wound #2 Left,Plantar Foot o Mepitel One Secondary Dressing Wound #2 Left,Plantar Foot o Gauze and Kerlix/Conform - coban to lightly to secure Dressing Change Frequency Wound #2 Left,Plantar Foot o Change dressing every week Follow-up Appointments Wound #2 Left,Plantar Foot o Return Appointment in 1 week. [facility] Hurston, Christabell K. ([id number]) Wound #2 Left,Plantar Foot o EpiFix application in clinic; including contact layer, fixation with steri strips, dry gauze and cover dressing. Electronic Signature(s) Signed: 07/30/2015 12:47:09 PM By: Curtis Sites Signed: 07/30/2015 1:01:02 PM By: Madelaine Bhat MD Entered By: Curtis Sites on 07/30/2015 12:47:09 Leeson, Caitland K. (  [id number]) -------------------------------------------------------------------------------- Problem List Details Patient Name: YOLANDRA, HABIG. Date of Service: 07/30/2015 9:15 AM Medical Record Patient Account Number: [id number] Number: Treating RN: Curtis Sites Jun 18, 1951 (64 y.o. Other Clinician: Date of Birth/Sex: Female) Treating BURNS III, Primary Care Physician: Aram Beecham Physician/Extender: Zollie Beckers Referring Physician: Bing Plume in Treatment: 7 Active Problems ICD-10 Encounter Code Description Active Date Diagnosis E11.621 Type 2 diabetes mellitus with foot ulcer 06/11/2015 Yes E11.40 Type 2 diabetes mellitus with diabetic neuropathy, 06/11/2015 Yes unspecified I70.245 Atherosclerosis of native arteries of left leg with ulceration 06/11/2015 Yes of other part of foot I25.10 Atherosclerotic heart disease of native coronary  artery 06/11/2015 Yes without angina pectoris Inactive Problems Resolved Problems Electronic Signature(s) Signed: 07/30/2015 1:01:02 PM By: Madelaine Bhat MD Entered By: Madelaine Bhat on 07/30/2015 12:20:30 Mathes, Jackqueline K. ([id number]) -------------------------------------------------------------------------------- Progress Note Details Patient Name: Dominique Marker K. Date of Service: 07/30/2015 9:15 AM Medical Record Patient Account Number: [id number] Number: Treating RN: Curtis Sites [birth year] (64 y.o. 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 History of Present Illness (HPI) Very pleasant 64 year old with past medical history significant for type 2 diabetes (hemoglobin A1c 7.3 in June 2016), peripheral neuropathy, peripheral vascular disease, and coronary artery disease. 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 She developed an ulceration in between her left third and fourth metatarsal heads in April 2014 after stepping on a piece of glass. She underwent LLE angioplasty in June 2014 by Dr. [name]. She was treated at the [facility] wound clinic with hyperbaric oxygen therapy for a deep tissue infection, which grew MRSA and did not significantly improve with antibiotics. X-rays showed no evidence for underlying osteomyelitis. She also underwent placement of Dermagraft x7. She completed a total of 60 HBO treatments at [facility] in Feb 2015. In-chamber TCOM >771mmHg. Her ulcer worsened after stopping HBO and she completed an additional 30 treatments (90 total) with significant improvement (eventually healed). She  underwent angioplasty of her left peroneal artery, tibioperoneal trunk, and superficial femoral artery on September 26, 2013. MRI on 10/12/13 showed no evidence for osteomyelitis or abscess. s/p Grafix x 5 and Epifix x 3 application with significant improvement. Significantly improved after starting to offload with knee walker, which she used at home but not in public for fear of falling. Also using darco shoe. Refused total contact cast secondary to fall history. Her ulcer was healed at her last clinic visit in Feb 2016. She returned to clinic after stepping on a piece of plastic in October 2016. She developed a painful callus and was seen by Dr. Alberteen Spindle in podiatry. The callus was debrided, and she was found to have an underlying ulceration. X-ray reportedly showed no evidence for underlying osteomyelitis. Records requested. Repeat x-ray 07/16/2015 showed no evidence for osteomyelitis. She reports moderate pain with pressure. No claudication or rest pain. ABI 0.8. She has been applying silver alginate and offloading with a knee walker when possible and a postop shoe and new orthotics. No fever or chills. Minimal bloody drainage. FATINA, SPRANKLE K. ([id number]) Objective Constitutional Pulse regular. Respirations normal and unlabored. Afebrile. Vitals Time Taken: 9:29 AM, Height: 68 in, Weight: 154 lbs, BMI: 23.4, Temperature: 98.2 F, Pulse: 75 bpm, Respiratory Rate: 18 breaths/min, Blood Pressure: 165/75  mmHg. General Notes: Left fourth metatarsal head plantar ulceration improved. Moderate surrounding callus. Full- thickness. No exposed deep structures. No probe to bone but close. No cellulitis. No palpable pedal pulses per her baseline. Dopplerable DP and PT. Left ABI 0.8. Epifix #1 applied. Integumentary (Hair, Skin) Wound #2 status is Open. Original cause of wound was Trauma. The wound is located on the Left,Plantar Foot. The wound measures 0.2cm length x 0.3cm width x 0.4cm depth; 0.047cm^2  area and 0.019cm^3 volume. The wound is limited to skin breakdown. There is no tunneling or undermining noted. There is a small amount of serous drainage noted. The wound margin is flat and intact. There is large (67-100%) pink granulation within the wound bed. There is no necrotic tissue within the wound bed. The periwound skin appearance exhibited: Callus, Moist. The periwound skin appearance did not exhibit: Crepitus, Excoriation, Fluctuance, Friable, Induration, Localized Edema, Rash, Scarring, Dry/Scaly, Maceration, Atrophie Blanche, Cyanosis, Ecchymosis, Hemosiderin Staining, Mottled, Pallor, Rubor, Erythema. Periwound temperature was noted as No Abnormality. The periwound has tenderness on palpation. Assessment Active Problems ICD-10 E11.621 - Type 2 diabetes mellitus with foot ulcer E11.40 - Type 2 diabetes mellitus with diabetic neuropathy, unspecified I70.245 - Atherosclerosis of native arteries of left leg with ulceration of other part of foot I25.10 - Atherosclerotic heart disease of native coronary artery without angina pectoris Chronic, recurrent left fourth metatarsal head ulceration, Wagner grade 1. Procedures Dominique MarkerOTEAT, Ressie K. ([id number]) Wound #2 Wound #2 is a Diabetic Wound/Ulcer of the Lower Extremity located on the Left,Plantar Foot. A skin graft procedure using a bioengineered skin substitute/cellular or tissue based product was performed by BURNS III, Melanie CrazierWALTER W., MD. Epifix was applied and secured with Steri-Strips. 1 sq cm of product was utilized and 1 sq cm was wasted due to wound size. Post Application, mepitel one was applied. A Time Out was conducted prior to the start of the procedure. The procedure was tolerated well with a pain level of 0 throughout and a pain level of 0 following the procedure. Post procedure Diagnosis Wound #2: Same as Pre-Procedure . Wound #2 is a Diabetic Wound/Ulcer of the Lower Extremity located on the Left,Plantar Foot . An  Callus Pairing procedure was performed by BURNS III, Melanie CrazierWALTER W., MD. Post procedure Diagnosis Wound #2: Same as Pre-Procedure Plan Epifix #1 applied. Continue offloading measures. Electronic Signature(s) Signed: 07/30/2015 1:01:02 PM By: Madelaine BhatBurns, III, Walter MD Entered By: Madelaine BhatBurns, III, Walter on 07/30/2015 12:22:07 Simonne MaffucciPOTEAT, Toba K. ([id number]) -------------------------------------------------------------------------------- SuperBill Details Patient Name: Simonne MaffucciPOTEAT, Dilpreet K. Date of Service: 07/30/2015 Medical Record Patient Account Number: [id number] Number: Treating RN: Curtis SitesDorthy, Joanna Oct 03, 1950 (64 y.o. Other Clinician: Date of Birth/Sex: Female) Treating BURNS III, Primary Care Physician: Aram BeechamSparks, Jeffrey Physician/Extender: Zollie BeckersWALTER Referring Physician: Bing PlumeSparks, Jeffrey Weeks in Treatment: 7 Diagnosis Coding ICD-10 Codes Code Description E11.621 Type 2 diabetes mellitus with foot ulcer E11.40 Type 2 diabetes mellitus with diabetic neuropathy, unspecified I70.245 Atherosclerosis of native arteries of left leg with ulceration of other part of foot I25.10 Atherosclerotic heart disease of native coronary artery without angina pectoris L84 Corns and callosities Facility Procedures CPT4 Code: [id number] Description: (Facility Use Only) Q4131 o Epifix o Per 1 SQ CM Modifier: Quantity: 2 CPT4 Code: [id number] Description: 15275 - SKIN SUB GRAFT FACE/NK/HF/G ICD-10 Description Diagnosis E11.621 Type 2 diabetes mellitus with foot ulcer Modifier: Quantity: 1 CPT4 Code: [id number] Description: 11055 - PARE BENIGN LES; SGL ICD-10 Description Diagnosis E11.621 Type 2 diabetes mellitus with foot ulcer L84 Corns and callosities Modifier: Quantity: 1 Physician  Procedures CPT4 Code: 1610960 Description: 15275 - WC PHYS SKIN SUB GRAFT FACE/NK/HF/G ICD-10 Description Diagnosis E11.621 Type 2 diabetes mellitus with foot ulcer Modifier: Quantity: 1 CPT4 Code: 4540981 Kingma,  Vihana Description: 11055 - WC PHYS PARE BENIGN LES; SGL ICD-10 Description Diagnosis K. ([id number]) Modifier: Quantity: 1 Electronic Signature(s) Signed: 07/30/2015 1:01:02 PM By: Madelaine Bhat MD Entered By: Madelaine Bhat on 07/30/2015 12:22:50

## 2015-07-31 NOTE — Progress Notes (Signed)
Dominique, [name] ([id number]) Visit Report for 07/30/2015 Arrival Information Details Patient Name: [name], [name]. Date of Service: 07/30/2015 9:15 AM Medical Record Number: [id number] Patient Account Number: [id number] Date of Birth/Sex: 10-20-1950 (64 y.o. Female) Treating RN: Curtis Sites Primary Care Physician: Aram Beecham Other Clinician: Referring Physician: Aram Beecham Treating Physician/Extender: BURNS III, Regis Bill in Treatment: 7 Visit Information History Since Last Visit Added or deleted any medications: No Patient Arrived: Ambulatory Any new allergies or adverse reactions: No Arrival Time: 09:28 Had a fall or experienced change in No Accompanied By: self activities of daily living that may affect Transfer Assistance: None risk of falls: Patient Identification Verified: Yes Signs or symptoms of abuse/neglect since last No Secondary Verification Process Yes visito Completed: Hospitalized since last visit: No Patient Has Alerts: Yes Pain Present Now: No Patient Alerts: Patient on Blood Thinner DMII warfarin Electronic Signature(s) Signed: 07/30/2015 4:28:04 PM By: Curtis Sites Entered By: Curtis Sites on 07/30/2015 09:28:35 Bogard, [name] ([id number]) -------------------------------------------------------------------------------- Encounter Discharge Information Details Patient Name: [name]. Date of Service: 07/30/2015 9:15 AM Medical Record Number: [id number] Patient Account Number: [id number] Date of Birth/Sex: June 01, 1951 (64 y.o. Female) Treating RN: Curtis Sites Primary Care Physician: Aram Beecham Other Clinician: Referring Physician: Aram Beecham Treating Physician/Extender: BURNS III, Regis Bill in Treatment: 7 Encounter Discharge Information Items Discharge Pain Level: 0 Discharge Condition: Stable Ambulatory Status: Ambulatory Discharge Destination: Home Transportation: Private Auto Accompanied By:  self Schedule Follow-up Appointment: Yes Medication Reconciliation completed and provided to Patient/Care No Makinsey Pepitone: Provided on Clinical Summary of Care: 07/30/2015 Form Type Recipient Paper Patient PP Electronic Signature(s) Signed: 07/30/2015 12:48:30 PM By: Curtis Sites Previous Signature: 07/30/2015 10:20:24 AM Version By: Gwenlyn Perking Entered By: Curtis Sites on 07/30/2015 12:48:30 Hogen, Delara K. ([id number]) -------------------------------------------------------------------------------- Lower Extremity Assessment Details Patient Name: Dominique Marker K. Date of Service: 07/30/2015 9:15 AM Medical Record Number: [id number] Patient Account Number: [id number] Date of Birth/Sex: [birth year] (64 y.o. Female) Treating RN: Curtis Sites Primary Care Physician: Aram Beecham Other Clinician: Referring Physician: Aram Beecham Treating Physician/Extender: BURNS III, Regis Bill in Treatment: 7 Vascular Assessment Pulses: Posterior Tibial Dorsalis Pedis Palpable: [Left:Yes] Extremity colors, hair growth, and conditions: Extremity Color: [Left:Normal] Hair Growth on Extremity: [Left:Yes] Temperature of Extremity: [Left:Warm] Capillary Refill: [Left:< 3 seconds] Electronic Signature(s) Signed: 07/30/2015 4:28:04 PM By: Curtis Sites Entered By: Curtis Sites on 07/30/2015 09:31:53 Coey, Steele K. ([id number]) -------------------------------------------------------------------------------- Multi Wound Chart Details Patient Name: Dominique Marker K. Date of Service: 07/30/2015 9:15 AM Medical Record Number: [id number] Patient Account Number: [id number] Date of Birth/Sex: 1950/09/10 (64 y.o. Female) Treating RN: Curtis Sites Primary Care Physician: Aram Beecham Other Clinician: Referring Physician: Aram Beecham Treating Physician/Extender: BURNS III, Regis Bill in Treatment: 7 Vital Signs Height(in): 68 Pulse(bpm): 75 Weight(lbs): 154 Blood  Pressure 165/75 (mmHg): Body Mass Index(BMI): 23 Temperature(F): 98.2 Respiratory Rate 18 (breaths/min): Photos: [2:No Photos] [N/A:N/A] Wound Location: [2:Left Foot - Plantar] [N/A:N/A] Wounding Event: [2:Trauma] [N/A:N/A] Primary Etiology: [2:Diabetic Wound/Ulcer of the Lower Extremity] [N/A:N/A] Comorbid History: [2:Cataracts, Angina, Arrhythmia, Coronary Artery Disease, Hypertension, Peripheral Arterial Disease, Type II Diabetes] [N/A:N/A] Date Acquired: [2:05/21/2015] [N/A:N/A] Weeks of Treatment: [2:7] [N/A:N/A] Wound Status: [2:Open] [N/A:N/A] Measurements L x W x D 0.2x0.3x0.4 [N/A:N/A] (cm) Area (cm) : [2:0.047] [N/A:N/A] Volume (cm) : [2:0.019] [N/A:N/A] % Reduction in Area: [2:25.40%] [N/A:N/A] % Reduction in Volume: 24.00% [N/A:N/A] Classification: [2:Grade 1] [N/A:N/A] Exudate Amount: [2:Small] [N/A:N/A] Exudate Type: [2:Serous] [N/A:N/A] Exudate Color: [2:amber] [N/A:N/A] Wound Margin: [2:Flat and Intact] [N/A:N/A] Granulation Amount: [2:Large (67-100%)] [N/A:N/A] Granulation  Quality: [2:Pink] [N/A:N/A] Necrotic Amount: [2:None Present (0%)] [N/A:N/A] Exposed Structures: [2:Fascia: No Fat: No] [N/A:N/A] Tendon: No Muscle: No Joint: No Bone: No Limited to Skin Breakdown Epithelialization: Small (1-33%) N/A N/A Periwound Skin Texture: Callus: Yes N/A N/A Edema: No Excoriation: No Induration: No Crepitus: No Fluctuance: No Friable: No Rash: No Scarring: No Periwound Skin Moist: Yes N/A N/A Moisture: Maceration: No Dry/Scaly: No Periwound Skin Color: Atrophie Blanche: No N/A N/A Cyanosis: No Ecchymosis: No Erythema: No Hemosiderin Staining: No Mottled: No Pallor: No Rubor: No Temperature: No Abnormality N/A N/A Tenderness on Yes N/A N/A Palpation: Wound Preparation: Ulcer Cleansing: N/A N/A Rinsed/Irrigated with Saline Topical Anesthetic Applied: Other: lidocaine 4% Treatment Notes Electronic Signature(s) Signed: 07/30/2015  4:28:04 PM By: Curtis Sites Entered By: Curtis Sites on 07/30/2015 09:36:36 Selner, [name] ([id number]) -------------------------------------------------------------------------------- Multi-Disciplinary Care Plan Details Patient Name: [name]. Date of Service: 07/30/2015 9:15 AM Medical Record Number: [id number] Patient Account Number: [id number] Date of Birth/Sex: [birth year] (64 y.o. Female) Treating RN: Curtis Sites Primary Care Physician: Aram Beecham Other Clinician: Referring Physician: Aram Beecham Treating Physician/Extender: BURNS III, Regis Bill in Treatment: 7 Active Inactive Wound/Skin Impairment Nursing Diagnoses: Impaired tissue integrity Goals: Patient/caregiver will verbalize understanding of skin care regimen Date Initiated: 06/11/2015 Goal Status: Active Ulcer/skin breakdown will have a volume reduction of 30% by week 4 Date Initiated: 06/11/2015 Goal Status: Active Ulcer/skin breakdown will have a volume reduction of 50% by week 8 Date Initiated: 06/11/2015 Goal Status: Active Ulcer/skin breakdown will have a volume reduction of 80% by week 12 Date Initiated: 06/11/2015 Goal Status: Active Ulcer/skin breakdown will heal within 14 weeks Date Initiated: 06/11/2015 Goal Status: Active Interventions: Assess patient/caregiver ability to obtain necessary supplies Assess ulceration(s) every visit Notes: Electronic Signature(s) Signed: 07/30/2015 4:28:04 PM By: Curtis Sites Entered By: Curtis Sites on 07/30/2015 09:36:28 Medina, [name] ([id number]) -------------------------------------------------------------------------------- Patient/Caregiver Education Details Patient Name: [name]. Date of Service: 07/30/2015 9:15 AM Medical Record Number: [id number] Patient Account Number: [id number] Date of Birth/Gender: 1951-07-13 (64 y.o. Female) Treating RN: Curtis Sites Primary Care Physician: Aram Beecham Other  Clinician: Referring Physician: Aram Beecham Treating Physician/Extender: BURNS III, Regis Bill in Treatment: 7 Education Assessment Education Provided To: Patient Education Topics Provided Wound/Skin Impairment: Handouts: Other: wrapping change if needed Methods: Demonstration, Explain/Verbal Responses: State content correctly Electronic Signature(s) Signed: 07/30/2015 12:48:47 PM By: Curtis Sites Entered By: Curtis Sites on 07/30/2015 12:48:47 Band, Aubryana K. ([id number]) -------------------------------------------------------------------------------- Wound Assessment Details Patient Name: Dominique Marker K. Date of Service: 07/30/2015 9:15 AM Medical Record Number: [id number] Patient Account Number: [id number] Date of Birth/Sex: 1951-02-14 (64 y.o. Female) Treating RN: Curtis Sites Primary Care Physician: Aram Beecham Other Clinician: Referring Physician: Aram Beecham Treating Physician/Extender: BURNS III, Regis Bill in Treatment: 7 Wound Status Wound Number: 2 Primary Diabetic Wound/Ulcer of the Lower Etiology: Extremity Wound Location: Left Foot - Plantar Wound Open Wounding Event: Trauma Status: Date Acquired: 05/21/2015 Comorbid Cataracts, Angina, Arrhythmia, Weeks Of Treatment: 7 History: Coronary Artery Disease, Hypertension, Clustered Wound: No Peripheral Arterial Disease, Type II Diabetes Photos Photo Uploaded By: Curtis Sites on 07/30/2015 12:01:50 Wound Measurements Length: (cm) 0.2 Width: (cm) 0.3 Depth: (cm) 0.4 Area: (cm) 0.047 Volume: (cm) 0.019 % Reduction in Area: 25.4% % Reduction in Volume: 24% Epithelialization: Small (1-33%) Tunneling: No Undermining: No Wound Description Classification: Grade 1 Wound Margin: Flat and Intact Exudate Amount: Small Exudate Type: Serous Exudate Color: amber Foul Odor After Cleansing: No Wound Bed Granulation Amount: Large (67-100%) Exposed Structure Granulation Quality:  Pink  Fascia Exposed: No Necrotic Amount: None Present (0%) Fat Layer Exposed: No Klostermann, Armetta K. ([id number]) Tendon Exposed: No Muscle Exposed: No Joint Exposed: No Bone Exposed: No Limited to Skin Breakdown Periwound Skin Texture Texture Color No Abnormalities Noted: No No Abnormalities Noted: No Callus: Yes Atrophie Blanche: No Crepitus: No Cyanosis: No Excoriation: No Ecchymosis: No Fluctuance: No Erythema: No Friable: No Hemosiderin Staining: No Induration: No Mottled: No Localized Edema: No Pallor: No Rash: No Rubor: No Scarring: No Temperature / Pain Moisture Temperature: No Abnormality No Abnormalities Noted: No Tenderness on Palpation: Yes Dry / Scaly: No Maceration: No Moist: Yes Wound Preparation Ulcer Cleansing: Rinsed/Irrigated with Saline Topical Anesthetic Applied: Other: lidocaine 4%, Treatment Notes Wound #2 (Left, Plantar Foot) 1. Cleansed with: Clean wound with Normal Saline 2. Anesthetic Topical Lidocaine 4% cream to wound bed prior to debridement 3. Peri-wound Care: Skin Prep 4. Dressing Applied: Mepitel Other dressing (specify in notes) 5. Secondary Dressing Applied Gauze and Kerlix/Conform Notes epifix applied in clinic today, gauze, kerlix and coban lightly to secure, darco shoe with peg assist Electronic Signature(s) Signed: 07/30/2015 4:28:04 PM By: Dyann Kieforthy, Joanna Grill, [name] ([id number]) Entered By: Curtis Sitesorthy, Joanna on 07/30/2015 09:36:20 Stawicki, Aisling K. ([id number]) -------------------------------------------------------------------------------- Vitals Details Patient Name: Dominique MarkerPOTEAT, Shamia K. Date of Service: 07/30/2015 9:15 AM Medical Record Number: [id number] Patient Account Number: [id number] Date of Birth/Sex: [birth year] (64 y.o. Female) Treating RN: Curtis Sitesorthy, Joanna Primary Care Physician: Aram BeechamSparks, Jeffrey Other Clinician: Referring Physician: Aram BeechamSparks, Jeffrey Treating Physician/Extender: BURNS III, Regis BillWALTER Weeks in  Treatment: 7 Vital Signs Time Taken: 09:29 Temperature (F): 98.2 Height (in): 68 Pulse (bpm): 75 Weight (lbs): 154 Respiratory Rate (breaths/min): 18 Body Mass Index (BMI): 23.4 Blood Pressure (mmHg): 165/75 Reference Range: 80 - 120 mg / dl Electronic Signature(s) Signed: 07/30/2015 4:28:04 PM By: Curtis Sitesorthy, Joanna Entered By: Curtis Sitesorthy, Joanna on 07/30/2015 09:31:01

## 2015-08-06 ENCOUNTER — Encounter: Payer: Medicare Other | Admitting: Surgery

## 2015-08-06 ENCOUNTER — Ambulatory Visit: Payer: Medicare Other | Admitting: Surgery

## 2015-08-06 DIAGNOSIS — E11621 Type 2 diabetes mellitus with foot ulcer: Secondary | ICD-10-CM | POA: Diagnosis not present

## 2015-08-07 NOTE — Progress Notes (Addendum)
BETHANI, BRUGGER ([id number]) Visit Report for 08/06/2015 Chief Complaint Document Details Patient Name: [name], [name]. Date of Service: 08/06/2015 3:30 PM Medical Record Patient Account Number: [id number] Number: Treating RN: Curtis Sites [birth year] (64 y.o. 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 Electronic Signature(s) Signed: 08/07/2015 8:52:47 AM By: Madelaine Bhat MD Entered By: Madelaine Bhat on 08/07/2015 08:08:29 [name] ([id number]) -------------------------------------------------------------------------------- Debridement Details Patient Name: [name], [name]. Date of Service: 08/06/2015 3:30 PM Medical Record Patient Account Number: [id number] Number: Treating RN: Curtis Sites Sep 21, 1950 (64 y.o. Other Clinician: Date of Birth/Sex: Female) Treating BURNS III, Primary Care Physician: Aram Beecham Physician/Extender: Zollie Beckers Referring Physician: Bing Plume in Treatment: 8 Debridement Performed for Wound #2 Left,Plantar Foot Assessment: Performed By: Physician BURNS III, Melanie Crazier., MD Debridement: Debridement Pre-procedure Yes Verification/Time Out Taken: Start Time: 15:56 Pain Control: Lidocaine 4% Topical Solution Level: Skin/Subcutaneous Tissue Total Area Debrided (L x 0.3 (cm) x 0.3 (cm) = 0.09 (cm) W): Tissue and other Viable, Non-Viable, Callus, Fat, Fibrin/Slough, Subcutaneous material debrided: Instrument: Blade, Curette Bleeding: Minimum Hemostasis Achieved: Pressure End Time: 15:58 Procedural Pain: 0 Post Procedural Pain: 0 Response to Treatment: Procedure was tolerated well Post Debridement Measurements of Total  Wound Length: (cm) 0.3 Width: (cm) 0.3 Depth: (cm) 0.4 Volume: (cm) 0.028 Post Procedure Diagnosis Same as Pre-procedure Electronic Signature(s) Signed: 08/07/2015 8:52:47 AM By: Madelaine Bhat MD Signed: 08/07/2015 4:57:31 PM By: Curtis Sites Previous Signature: 08/06/2015 4:23:19 PM Version By: Madelaine Bhat MD Previous Signature: 08/06/2015 5:51:20 PM Version By: Curtis Sites Entered By: Madelaine Bhat on 08/07/2015 08:08:20 Wilbourn, Tarena K. ([id number]) [name], [name] ([id number]) -------------------------------------------------------------------------------- HPI Details Patient Name: [name], [name] K. Date of Service: 08/06/2015 3:30 PM Medical Record Patient Account Number: [id number] Number: Treating RN: Curtis Sites 05-05-1951 (64 y.o. Other Clinician: Date of Birth/Sex: Female) Treating BURNS III, Primary Care Physician: Aram Beecham Physician/Extender: Zollie Beckers Referring Physician: Bing Plume in Treatment: 8 History of Present Illness HPI Description: Very pleasant 64 year old with past medical history significant for type 2 diabetes (hemoglobin A1c 7.3 in June 2016), peripheral neuropathy, peripheral vascular disease, and coronary artery disease. 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 She developed an ulceration in between her left third and fourth metatarsal heads in April 2014 after stepping on a piece of glass. She underwent LLE angioplasty in June 2014 by Dr. [name]. She was treated at the [facility] wound clinic with hyperbaric oxygen therapy for a deep tissue infection, which grew MRSA and did not significantly improve with antibiotics. X-rays showed no evidence for underlying osteomyelitis. She also underwent placement of Dermagraft x7. She completed a total of 60 HBO treatments at [facility] in Feb 2015. In-chamber TCOM >721mmHg.  Her ulcer worsened after stopping HBO and she completed an additional 30 treatments (90 total) with significant improvement (eventually healed). She underwent angioplasty of her left peroneal artery, tibioperoneal trunk, and superficial femoral artery on September 26, 2013. MRI on 10/12/13 showed no evidence for osteomyelitis or abscess. s/p Grafix x 5 and Epifix x 3 application with significant improvement. Significantly improved after starting to offload with knee walker, which she used at home but not in public for fear of falling. Also using darco shoe.  Refused total contact cast secondary to fall history. Her ulcer was healed at her last clinic visit in Feb 2016. She returned to clinic after stepping on a piece of plastic in October 2016. She developed a painful callus and was seen by Dr. Alberteen Spindle in podiatry. The callus was debrided, and she was found to have an underlying ulceration. X-ray reportedly showed no evidence for underlying osteomyelitis. Records requested. Repeat x-ray 07/16/2015 showed no evidence for osteomyelitis. She reports moderate pain with pressure. No claudication or rest pain. ABI 0.8. She has been applying silver alginate and offloading with a knee walker when possible and a postop shoe and new orthotics. Epifix applied 07/30/2015. No fever or chills. Minimal bloody drainage. Electronic Signature(s) Signed: 08/07/2015 8:52:47 AM By: Madelaine Bhat MD Entered By: Madelaine Bhat on 08/07/2015 08:09:36 [name], [name] ([id number]) -------------------------------------------------------------------------------- Physical Exam Details Patient Name: [name], [name] K. Date of Service: 08/06/2015 3:30 PM Medical Record Patient Account Number: [id number] Number: Treating RN: Curtis Sites [birth year] (64 y.o. Other Clinician: Date of Birth/Sex: Female) Treating BURNS III, Primary Care Physician: Aram Beecham Physician/Extender: Zollie Beckers Referring  Physician: Bing Plume in Treatment: 8 Constitutional . Pulse regular. Respirations normal and unlabored. Afebrile. . Notes Left fourth metatarsal head plantar ulceration improved. Moderate surrounding callus. Full-thickness. No exposed deep structures. No probe to bone but close. No cellulitis. No palpable pedal pulses per her baseline. Dopplerable DP and PT. Left ABI 0.8. Electronic Signature(s) Signed: 08/07/2015 8:52:47 AM By: Madelaine Bhat MD Entered By: Madelaine Bhat on 08/07/2015 08:10:09 Ceniceros, [name] ([id number]) -------------------------------------------------------------------------------- Physician Orders Details Patient Name: [name]. Date of Service: 08/06/2015 3:30 PM Medical Record Patient Account Number: [id number] Number: Treating RN: Curtis Sites 1951/01/21 (64 y.o. Other Clinician: Date of Birth/Sex: Female) Treating BURNS III, Primary Care Physician: Aram Beecham Physician/Extender: Zollie Beckers Referring Physician: Bing Plume in Treatment: 8 Verbal / Phone Orders: Yes Clinician: Curtis Sites Read Back and Verified: Yes Diagnosis Coding Wound Cleansing Wound #2 Left,Plantar Foot o Cleanse wound with mild soap and water o May Shower, gently pat wound dry prior to applying new dressing. Primary Wound Dressing Wound #2 Left,Plantar Foot o Aquacel Ag Secondary Dressing Wound #2 Left,Plantar Foot o Gauze and Kerlix/Conform Dressing Change Frequency Wound #2 Left,Plantar Foot o Change dressing every other day. Follow-up Appointments Wound #2 Left,Plantar Foot o Return Appointment in 1 week. Off-Loading Wound #2 Left,Plantar Foot o Open toe surgical shoe with peg assist. Electronic Signature(s) Signed: 08/06/2015 4:23:19 PM By: Madelaine Bhat MD Signed: 08/06/2015 5:51:20 PM By: Curtis Sites Entered By: Curtis Sites on 08/06/2015 15:59:16 [name], [name]  ([id number]) -------------------------------------------------------------------------------- Problem List Details Patient Name: [name], [name] Date of Service: 08/06/2015 3:30 PM Medical Record Patient Account Number: [id number] Number: Treating RN: Curtis Sites [birth year] (64 y.o. Other Clinician: Date of Birth/Sex: Female) Treating BURNS III, Primary Care Physician: Aram Beecham Physician/Extender: Zollie Beckers Referring Physician: Bing Plume in Treatment: 8 Active Problems ICD-10 Encounter Code Description Active Date Diagnosis E11.621 Type 2 diabetes mellitus with foot ulcer 06/11/2015 Yes E11.40 Type 2 diabetes mellitus with diabetic neuropathy, 06/11/2015 Yes unspecified I70.245 Atherosclerosis of native arteries of left leg with ulceration 06/11/2015 Yes of other part of foot I25.10 Atherosclerotic heart disease of native coronary artery 06/11/2015 Yes without angina pectoris Inactive Problems Resolved Problems Electronic Signature(s) Signed: 08/07/2015 8:52:47 AM By: Madelaine Bhat MD Entered By: Madelaine Bhat on 08/07/2015 08:07:52 [name], [name] ([id number]) -------------------------------------------------------------------------------- Progress Note Details Patient  Name: [name], [name] Date of Service: 08/06/2015 3:30 PM Medical Record Patient Account Number: [id number] Number: Treating RN: Curtis SitesDorthy, JoannSimonne Maffuccia [birth year] (64 y.o. 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 History of Present Illness (HPI) Very pleasant 64 year old with past medical history significant for type 2 diabetes (hemoglobin A1c 7.3 in June 2016), peripheral neuropathy,  peripheral vascular disease, and coronary artery disease. 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 She developed an ulceration in between her left third and fourth metatarsal heads in April 2014 after stepping on a piece of glass. She underwent LLE angioplasty in June 2014 by Dr. Gilda CreaseSchnier. She [facility] with hyperbaric oxygen therapy for a deep tissue infection, which grew MRSA and did not significantly improve with antibiotics. X-rays showed no evidence for underlying osteomyelitis. She also underwent placement of Dermagraft x7. She completed a total of 60 HBO treatments at [facility] in Feb 2015. In-chamber TCOM >71400mmHg. Her ulcer worsened after stopping HBO and she completed an additional 30 treatments (90 total) with significant improvement (eventually healed). She underwent angioplasty of her left peroneal artery, tibioperoneal trunk, and superficial femoral artery on September 26, 2013. MRI on 10/12/13 showed no evidence for osteomyelitis or abscess. s/p Grafix x 5 and Epifix x 3 application with significant improvement. Significantly improved after starting to offload with knee walker, which she used at home but not in public for fear of falling. Also using darco shoe. Refused total contact cast secondary to fall history. Her ulcer was healed at her last clinic visit in Feb 2016. She returned to clinic after stepping on a piece of plastic in October 2016. She developed a painful callus and was seen by Dr. Alberteen Spindleline in podiatry. The callus was debrided, and she was found to have an underlying ulceration. X-ray reportedly showed no evidence for underlying osteomyelitis. Records requested. Repeat x-ray 07/16/2015 showed no evidence for osteomyelitis. She reports moderate pain with pressure. No claudication or rest pain. ABI 0.8. She has been applying silver alginate and offloading with a  knee walker when possible and a postop shoe and new orthotics. Epifix applied 07/30/2015. No fever or chills. Minimal bloody drainage. [name], [name] ([id number]) Objective Constitutional Pulse regular. Respirations normal and unlabored. Afebrile. Vitals Time Taken: 3:33 PM, Height: 68 in, Weight: 154 lbs, BMI: 23.4, Temperature: 98.3 F, Pulse: 72 bpm, Respiratory Rate: 18 breaths/min, Blood Pressure: 165/83 mmHg. General Notes: Left fourth metatarsal head plantar ulceration improved. Moderate surrounding callus. Full- thickness. No exposed deep structures. No probe to bone but close. No cellulitis. No palpable pedal pulses per her baseline. Dopplerable DP and PT. Left ABI 0.8. Integumentary (Hair, Skin) Wound #2 status is Open. Original cause of wound was Trauma. The wound is located on the Left,Plantar Foot. The wound measures 0.3cm length x 0.3cm width x 0.3cm depth; 0.071cm^2 area and 0.021cm^3 volume. The wound is limited to skin breakdown. There is no tunneling or undermining noted. There is a small amount of serous drainage noted. The wound margin is flat and intact. There is large (67-100%) pink granulation within the wound bed. There is no necrotic tissue within the wound bed. The periwound skin appearance exhibited: Callus, Moist. The periwound skin appearance did not exhibit: Crepitus, Excoriation,  Fluctuance, Friable, Induration, Localized Edema, Rash, Scarring, Dry/Scaly, Maceration, Atrophie Blanche, Cyanosis, Ecchymosis, Hemosiderin Staining, Mottled, Pallor, Rubor, Erythema. Periwound temperature was noted as No Abnormality. The periwound has tenderness on palpation. Assessment Active Problems ICD-10 E11.621 - Type 2 diabetes mellitus with foot ulcer E11.40 - Type 2 diabetes mellitus with diabetic neuropathy, unspecified I70.245 - Atherosclerosis of native arteries of left leg with ulceration of other part of foot I25.10 - Atherosclerotic heart disease of native  coronary artery without angina pectoris L 4th MTH ulcer. Procedures [name], [name] ([id number]) Wound #2 Wound #2 is a Diabetic Wound/Ulcer of the Lower Extremity located on the Left,Plantar Foot . There was a Skin/Subcutaneous Tissue Debridement ([id number]) debridement with total area of 0.09 sq cm performed by BURNS III, Melanie Crazier., MD. with the following instrument(s): Blade and Curette to remove Viable and Non-Viable tissue/material including Fat, Fibrin/Slough, Callus, and Subcutaneous after achieving pain control using Lidocaine 4% Topical Solution. A time out was conducted prior to the start of the procedure. A Minimum amount of bleeding was controlled with Pressure. The procedure was tolerated well with a pain level of 0 throughout and a pain level of 0 following the procedure. Post Debridement Measurements: 0.3cm length x 0.3cm width x 0.4cm depth; 0.028cm^3 volume. Post procedure Diagnosis Wound #2: Same as Pre-Procedure Plan Wound Cleansing: Wound #2 Left,Plantar Foot: Cleanse wound with mild soap and water May Shower, gently pat wound dry prior to applying new dressing. Primary Wound Dressing: Wound #2 Left,Plantar Foot: Aquacel Ag Secondary Dressing: Wound #2 Left,Plantar Foot: Gauze and Kerlix/Conform Dressing Change Frequency: Wound #2 Left,Plantar Foot: Change dressing every other day. Follow-up Appointments: Wound #2 Left,Plantar Foot: Return Appointment in 1 week. Off-Loading: Wound #2 Left,Plantar Foot: Open toe surgical shoe with peg assist. silver alginate offloading order epifix for next week. [name], [name] ([id number]) Electronic Signature(s) Signed: 08/07/2015 8:52:47 AM By: Madelaine Bhat MD Entered By: Madelaine Bhat on 08/07/2015 08:10:48 [name] ([id number]) -------------------------------------------------------------------------------- SuperBill Details Patient Name: [name]. Date of Service:  08/06/2015 Medical Record Patient Account Number: [id number] Number: Treating RN: Curtis Sites [birth year] (64 y.o. Other Clinician: Date of Birth/Sex: Female) Treating BURNS III, Primary Care Physician: Aram Beecham Physician/Extender: Zollie Beckers Referring Physician: Bing Plume in Treatment: 8 Diagnosis Coding ICD-10 Codes Code Description E11.621 Type 2 diabetes mellitus with foot ulcer E11.40 Type 2 diabetes mellitus with diabetic neuropathy, unspecified I70.245 Atherosclerosis of native arteries of left leg with ulceration of other part of foot I25.10 Atherosclerotic heart disease of native coronary artery without angina pectoris Facility Procedures CPT4 Code: [id number] Description: 11042 - DEB SUBQ TISSUE 20 SQ CM/< ICD-10 Description Diagnosis E11.621 Type 2 diabetes mellitus with foot ulcer Modifier: Quantity: 1 Physician Procedures CPT4 Code: [id number] Description: 11042 - WC PHYS SUBQ TISS 20 SQ CM ICD-10 Description Diagnosis E11.621 Type 2 diabetes mellitus with foot ulcer Modifier: Quantity: 1 Electronic Signature(s) Signed: 08/07/2015 8:52:47 AM By: Madelaine Bhat MD Entered By: Madelaine Bhat on 08/07/2015 08:10:58

## 2015-08-07 NOTE — Progress Notes (Signed)
[name] ([id number]) Visit Report for 08/06/2015 Arrival Information Details Patient Name: [name], [name]. Date of Service: 08/06/2015 3:30 PM Medical Record Number: [id number] Patient Account Number: [id number] Date of Birth/Sex: Jan 17, 1951 (64 y.o. Female) Treating RN: Curtis Sites Primary Care Physician: Aram Beecham Other Clinician: Referring Physician: Aram Beecham Treating Physician/Extender: BURNS III, Regis Bill in Treatment: 8 Visit Information History Since Last Visit Added or deleted any medications: No Patient Arrived: Ambulatory Any new allergies or adverse reactions: No Arrival Time: 15:32 Had a fall or experienced change in No Accompanied By: self activities of daily living that may affect Transfer Assistance: None risk of falls: Patient Identification Verified: Yes Signs or symptoms of abuse/neglect since last No Secondary Verification Process Yes visito Completed: Hospitalized since last visit: No Patient Has Alerts: Yes Pain Present Now: No Patient Alerts: Patient on Blood Thinner DMII warfarin Electronic Signature(s) Signed: 08/06/2015 5:51:20 PM By: Curtis Sites Entered By: Curtis Sites on 08/06/2015 15:33:25 Lunsford, Zophia Kirtland Bouchard ([id number]) -------------------------------------------------------------------------------- Encounter Discharge Information Details Patient Name: [name] Date of Service: 08/06/2015 3:30 PM Medical Record Number: [id number] Patient Account Number: [id number] Date of Birth/Sex: 12-Jul-1951 (64 y.o. Female) Treating RN: Curtis Sites Primary Care Physician: Aram Beecham Other Clinician: Referring Physician: Aram Beecham Treating Physician/Extender: BURNS III, Regis Bill in Treatment: 8 Encounter Discharge Information Items Discharge Pain Level: 0 Discharge Condition: Stable Ambulatory Status: Ambulatory Discharge Destination: Home Private Transportation: Auto Accompanied By:  self Schedule Follow-up Appointment: Yes Medication Reconciliation completed and No provided to Patient/Care Lashika Erker: Clinical Summary of Care: Electronic Signature(s) Signed: 08/06/2015 4:29:31 PM By: Curtis Sites Entered By: Curtis Sites on 08/06/2015 16:29:31 Neumeier, Aliayah Kirtland Bouchard ([id number]) -------------------------------------------------------------------------------- Lower Extremity Assessment Details Patient Name: [name] Date of Service: 08/06/2015 3:30 PM Medical Record Number: [id number] Patient Account Number: [id number] Date of Birth/Sex: 25-Jan-1951 (64 y.o. Female) Treating RN: Curtis Sites Primary Care Physician: Aram Beecham Other Clinician: Referring Physician: Aram Beecham Treating Physician/Extender: BURNS III, Regis Bill in Treatment: 8 Edema Assessment Assessed: [Left: No] [Right: No] Edema: [Left: N] [Right: o] Vascular Assessment Pulses: Posterior Tibial Dorsalis Pedis Palpable: [Left:Yes] Extremity colors, hair growth, and conditions: Extremity Color: [Left:Normal] Hair Growth on Extremity: [Left:No] Temperature of Extremity: [Left:Warm] Capillary Refill: [Left:< 3 seconds] Electronic Signature(s) Signed: 08/06/2015 5:51:20 PM By: Curtis Sites Entered By: Curtis Sites on 08/06/2015 15:44:03 Billman, Chani K. ([id number]) -------------------------------------------------------------------------------- Multi Wound Chart Details Patient Name: [name] Date of Service: 08/06/2015 3:30 PM Medical Record Number: [id number] Patient Account Number: [id number] Date of Birth/Sex: 05/02/1951 (64 y.o. Female) Treating RN: Curtis Sites Primary Care Physician: Aram Beecham Other Clinician: Referring Physician: Aram Beecham Treating Physician/Extender: BURNS III, Regis Bill in Treatment: 8 Vital Signs Height(in): 68 Pulse(bpm): 72 Weight(lbs): 154 Blood Pressure 165/83 (mmHg): Body Mass Index(BMI):  23 Temperature(F): 98.3 Respiratory Rate 18 (breaths/min): Photos: [2:No Photos] [N/A:N/A] Wound Location: [2:Left Foot - Plantar] [N/A:N/A] Wounding Event: [2:Trauma] [N/A:N/A] Primary Etiology: [2:Diabetic Wound/Ulcer of the Lower Extremity] [N/A:N/A] Comorbid History: [2:Cataracts, Angina, Arrhythmia, Coronary Artery Disease, Hypertension, Peripheral Arterial Disease, Type II Diabetes] [N/A:N/A] Date Acquired: [2:05/21/2015] [N/A:N/A] Weeks of Treatment: [2:8] [N/A:N/A] Wound Status: [2:Open] [N/A:N/A] Measurements L x W x D 0.3x0.3x0.3 [N/A:N/A] (cm) Area (cm) : [2:0.071] [N/A:N/A] Volume (cm) : [2:0.021] [N/A:N/A] % Reduction in Area: [2:-12.70%] [N/A:N/A] % Reduction in Volume: 16.00% [N/A:N/A] Classification: [2:Grade 1] [N/A:N/A] Exudate Amount: [2:Small] [N/A:N/A] Exudate Type: [2:Serous] [N/A:N/A] Exudate Color: [2:amber] [N/A:N/A] Wound Margin: [2:Flat and Intact] [N/A:N/A] Granulation Amount: [2:Large (67-100%)] [N/A:N/A] Granulation Quality: [2:Pink] [N/A:N/A] Necrotic Amount: [2:None  Present (0%)] [N/A:N/A] Exposed Structures: [2:Fascia: No Fat: No] [N/A:N/A] Tendon: No Muscle: No Joint: No Bone: No Limited to Skin Breakdown Epithelialization: Small (1-33%) N/A N/A Periwound Skin Texture: Callus: Yes N/A N/A Edema: No Excoriation: No Induration: No Crepitus: No Fluctuance: No Friable: No Rash: No Scarring: No Periwound Skin Moist: Yes N/A N/A Moisture: Maceration: No Dry/Scaly: No Periwound Skin Color: Atrophie Blanche: No N/A N/A Cyanosis: No Ecchymosis: No Erythema: No Hemosiderin Staining: No Mottled: No Pallor: No Rubor: No Temperature: No Abnormality N/A N/A Tenderness on Yes N/A N/A Palpation: Wound Preparation: Ulcer Cleansing: N/A N/A Rinsed/Irrigated with Saline Topical Anesthetic Applied: Other: lidocaine 4% Treatment Notes Electronic Signature(s) Signed: 08/06/2015 5:51:20 PM By: Curtis Sites Entered By: Curtis Sites on 08/06/2015 15:44:18 Berisha, Cathalina Kirtland Bouchard ([id number]) -------------------------------------------------------------------------------- Multi-Disciplinary Care Plan Details Patient Name: [name]. Date of Service: 08/06/2015 3:30 PM Medical Record Number: [id number] Patient Account Number: [id number] Date of Birth/Sex: 20-Apr-1951 (64 y.o. Female) Treating RN: Curtis Sites Primary Care Physician: Aram Beecham Other Clinician: Referring Physician: Aram Beecham Treating Physician/Extender: BURNS III, Regis Bill in Treatment: 8 Active Inactive Wound/Skin Impairment Nursing Diagnoses: Impaired tissue integrity Goals: Patient/caregiver will verbalize understanding of skin care regimen Date Initiated: 06/11/2015 Goal Status: Active Ulcer/skin breakdown will have a volume reduction of 30% by week 4 Date Initiated: 06/11/2015 Goal Status: Active Ulcer/skin breakdown will have a volume reduction of 50% by week 8 Date Initiated: 06/11/2015 Goal Status: Active Ulcer/skin breakdown will have a volume reduction of 80% by week 12 Date Initiated: 06/11/2015 Goal Status: Active Ulcer/skin breakdown will heal within 14 weeks Date Initiated: 06/11/2015 Goal Status: Active Interventions: Assess patient/caregiver ability to obtain necessary supplies Assess ulceration(s) every visit Notes: Electronic Signature(s) Signed: 08/06/2015 5:51:20 PM By: Curtis Sites Entered By: Curtis Sites on 08/06/2015 15:44:10 Teti, [name] ([id number]) -------------------------------------------------------------------------------- Patient/Caregiver Education Details Patient Name: [name]. Date of Service: 08/06/2015 3:30 PM Medical Record Number: [id number] Patient Account Number: [id number] Date of Birth/Gender: 05-31-1951 (64 y.o. Female) Treating RN: Curtis Sites Primary Care Physician: Aram Beecham Other Clinician: Referring Physician: Aram Beecham Treating  Physician/Extender: BURNS III, Regis Bill in Treatment: 8 Education Assessment Education Provided To: Patient Education Topics Provided Offloading: Handouts: Other: try new offloading custom fit shoe Methods: Explain/Verbal Responses: State content correctly Electronic Signature(s) Signed: 08/06/2015 4:30:06 PM By: Curtis Sites Entered By: Curtis Sites on 08/06/2015 16:30:06 Pring, Teaira K. ([id number]) -------------------------------------------------------------------------------- Wound Assessment Details Patient Name: [name] Date of Service: 08/06/2015 3:30 PM Medical Record Number: [id number] Patient Account Number: [id number] Date of Birth/Sex: Jan 06, 1951 (64 y.o. Female) Treating RN: Curtis Sites Primary Care Physician: Aram Beecham Other Clinician: Referring Physician: Aram Beecham Treating Physician/Extender: BURNS III, Regis Bill in Treatment: 8 Wound Status Wound Number: 2 Primary Diabetic Wound/Ulcer of the Lower Etiology: Extremity Wound Location: Left Foot - Plantar Wound Open Wounding Event: Trauma Status: Date Acquired: 05/21/2015 Comorbid Cataracts, Angina, Arrhythmia, Weeks Of Treatment: 8 History: Coronary Artery Disease, Hypertension, Clustered Wound: No Peripheral Arterial Disease, Type II Diabetes Photos Wound Measurements Length: (cm) 0.3 Width: (cm) 0.3 Depth: (cm) 0.3 Area: (cm) 0.071 Volume: (cm) 0.021 % Reduction in Area: -12.7% % Reduction in Volume: 16% Epithelialization: Small (1-33%) Tunneling: No Undermining: No Wound Description Classification: Grade 1 Wound Margin: Flat and Intact Exudate Amount: Small Exudate Type: Serous Exudate Color: amber Foul Odor After Cleansing: No Wound Bed Granulation Amount: Large (67-100%) Exposed Structure Granulation Quality: Pink Fascia Exposed: No Necrotic Amount: None Present (0%) Fat Layer Exposed: No Tendon Exposed: No  Simonne MaffucciOTEAT, Itali K.  ([id number]) Muscle Exposed: No Joint Exposed: No Bone Exposed: No Limited to Skin Breakdown Periwound Skin Texture Texture Color No Abnormalities Noted: No No Abnormalities Noted: No Callus: Yes Atrophie Blanche: No Crepitus: No Cyanosis: No Excoriation: No Ecchymosis: No Fluctuance: No Erythema: No Friable: No Hemosiderin Staining: No Induration: No Mottled: No Localized Edema: No Pallor: No Rash: No Rubor: No Scarring: No Temperature / Pain Moisture Temperature: No Abnormality No Abnormalities Noted: No Tenderness on Palpation: Yes Dry / Scaly: No Maceration: No Moist: Yes Wound Preparation Ulcer Cleansing: Rinsed/Irrigated with Saline Topical Anesthetic Applied: Other: lidocaine 4%, Treatment Notes Wound #2 (Left, Plantar Foot) 1. Cleansed with: Clean wound with Normal Saline 2. Anesthetic Topical Lidocaine 4% cream to wound bed prior to debridement 4. Dressing Applied: Aquacel Ag 5. Secondary Dressing Applied Gauze and Kerlix/Conform 7. Secured with Tape Notes darco shoe with peg assist Electronic Signature(s) Signed: 08/06/2015 4:50:29 PM By: Curtis Sitesorthy, Joanna Entered By: Curtis Sitesorthy, Joanna on 08/06/2015 16:50:29 Ponciano, Brittish K. ([id number]) [place], [name] ([id number]) -------------------------------------------------------------------------------- Vitals Details Patient Name: [name], Sandrea K. Date of Service: 08/06/2015 3:30 PM Medical Record Number: [id number] Patient Account Number: [id number] Date of Birth/Sex: January 27, 1951 (64 y.o. Female) Treating RN: Curtis Sitesorthy, Joanna Primary Care Physician: Aram BeechamSparks, Jeffrey Other Clinician: Referring Physician: Aram BeechamSparks, Jeffrey Treating Physician/Extender: BURNS III, Regis BillWALTER Weeks in Treatment: 8 Vital Signs Time Taken: 15:33 Temperature (F): 98.3 Height (in): 68 Pulse (bpm): 72 Weight (lbs): 154 Respiratory Rate (breaths/min): 18 Body Mass Index (BMI): 23.4 Blood Pressure (mmHg): 165/83 Reference Range:  80 - 120 mg / dl Electronic Signature(s) Signed: 08/06/2015 5:51:20 PM By: Curtis Sitesorthy, Joanna Entered By: Curtis Sitesorthy, Joanna on 08/06/2015 15:35:17

## 2015-08-10 HISTORY — PX: EYE SURGERY: SHX253

## 2015-08-13 ENCOUNTER — Encounter: Payer: Medicare Other | Attending: Surgery | Admitting: Surgery

## 2015-08-13 DIAGNOSIS — I70245 Atherosclerosis of native arteries of left leg with ulceration of other part of foot: Secondary | ICD-10-CM | POA: Insufficient documentation

## 2015-08-13 DIAGNOSIS — E11621 Type 2 diabetes mellitus with foot ulcer: Secondary | ICD-10-CM | POA: Insufficient documentation

## 2015-08-13 DIAGNOSIS — I10 Essential (primary) hypertension: Secondary | ICD-10-CM | POA: Insufficient documentation

## 2015-08-13 DIAGNOSIS — I739 Peripheral vascular disease, unspecified: Secondary | ICD-10-CM | POA: Diagnosis not present

## 2015-08-13 DIAGNOSIS — I2510 Atherosclerotic heart disease of native coronary artery without angina pectoris: Secondary | ICD-10-CM | POA: Insufficient documentation

## 2015-08-13 DIAGNOSIS — L97421 Non-pressure chronic ulcer of left heel and midfoot limited to breakdown of skin: Secondary | ICD-10-CM | POA: Diagnosis not present

## 2015-08-14 NOTE — Progress Notes (Signed)
Dominique, [name] ([id number]) Visit Report for 08/13/2015 Arrival Information Details Patient Name: [name], [name]. Date of Service: 08/13/2015 2:15 PM Medical Record Number: [id number] Patient Account Number: [id number] Date of Birth/Sex: 1951/03/05 (65 y.o. Female) Treating RN: Phillis Haggis Primary Care Physician: Aram Beecham Other Clinician: Referring Physician: Aram Beecham Treating Physician/Extender: BURNS III, Regis Bill in Treatment: 9 Visit Information History Since Last Visit All ordered tests and consults were completed: No Patient Arrived: Ambulatory Added or deleted any medications: No Arrival Time: 14:20 Any new allergies or adverse reactions: No Accompanied By: self Had a fall or experienced change in No Transfer Assistance: None activities of daily living that may affect Patient Identification Verified: Yes risk of falls: Secondary Verification Process Yes Signs or symptoms of abuse/neglect since last No Completed: visito Patient Requires Transmission- No Hospitalized since last visit: No Based Precautions: Pain Present Now: Yes Patient Has Alerts: Yes Patient Alerts: Patient on Blood Thinner DMII warfarin Electronic Signature(s) Signed: 08/13/2015 5:03:03 PM By: Alejandro Mulling Entered By: Alejandro Mulling on 08/13/2015 14:21:14 Bushong, [name] ([id number]) -------------------------------------------------------------------------------- Encounter Discharge Information Details Patient Name: [name]. Date of Service: 08/13/2015 2:15 PM Medical Record Number: [id number] Patient Account Number: [id number] Date of Birth/Sex: 1950/12/28 (65 y.o. Female) Treating RN: Phillis Haggis Primary Care Physician: Aram Beecham Other Clinician: Referring Physician: Aram Beecham Treating Physician/Extender: BURNS III, Regis Bill in Treatment: 9 Encounter Discharge Information Items Discharge Pain Level: 0 Discharge Condition:  Stable Ambulatory Status: Ambulatory Discharge Destination: Home Private Transportation: Auto Accompanied By: self Schedule Follow-up Appointment: Yes Medication Reconciliation completed and Yes provided to Patient/Care Rubena Roseman: Clinical Summary of Care: Electronic Signature(s) Signed: 08/13/2015 5:03:03 PM By: Alejandro Mulling Entered By: Alejandro Mulling on 08/13/2015 14:54:22 Rudy, Sheryl K. ([id number]) -------------------------------------------------------------------------------- Lower Extremity Assessment Details Patient Name: [name]. Date of Service: 08/13/2015 2:15 PM Medical Record Number: [id number] Patient Account Number: [id number] Date of Birth/Sex: 05/26/1951 (65 y.o. Female) Treating RN: Ashok Cordia, Debi Primary Care Physician: Aram Beecham Other Clinician: Referring Physician: Aram Beecham Treating Physician/Extender: BURNS III, Regis Bill in Treatment: 9 Edema Assessment Assessed: [Left: No] [Right: No] Edema: [Left: N] [Right: o] Vascular Assessment Pulses: Posterior Tibial Dorsalis Pedis Palpable: [Left:Yes] Extremity colors, hair growth, and conditions: Extremity Color: [Left:Normal] Hair Growth on Extremity: [Left:Yes] Temperature of Extremity: [Left:Warm] Capillary Refill: [Left:< 3 seconds] Toe Nail Assessment Left: Right: Thick: No Discolored: No Deformed: No Improper Length and Hygiene: No Electronic Signature(s) Signed: 08/13/2015 5:03:03 PM By: Alejandro Mulling Entered By: Alejandro Mulling on 08/13/2015 14:25:42 Bowland, Ella K. ([id number]) -------------------------------------------------------------------------------- Multi Wound Chart Details Patient Name: Dominique Marker K. Date of Service: 08/13/2015 2:15 PM Medical Record Number: [id number] Patient Account Number: [id number] Date of Birth/Sex: [birth year] (65 y.o. Female) Treating RN: Phillis Haggis Primary Care Physician: Aram Beecham Other Clinician: Referring  Physician: Aram Beecham Treating Physician/Extender: BURNS III, Regis Bill in Treatment: 9 Vital Signs Height(in): 68 Pulse(bpm): 69 Weight(lbs): 154 Blood Pressure 128/63 (mmHg): Body Mass Index(BMI): 23 Temperature(F): 97.8 Respiratory Rate 18 (breaths/min): Photos: [2:No Photos] [N/A:N/A] Wound Location: [2:Left Foot - Plantar] [N/A:N/A] Wounding Event: [2:Trauma] [N/A:N/A] Primary Etiology: [2:Diabetic Wound/Ulcer of the Lower Extremity] [N/A:N/A] Comorbid History: [2:Cataracts, Angina, Arrhythmia, Coronary Artery Disease, Hypertension, Peripheral Arterial Disease, Type II Diabetes] [N/A:N/A] Date Acquired: [2:05/21/2015] [N/A:N/A] Weeks of Treatment: [2:9] [N/A:N/A] Wound Status: [2:Open] [N/A:N/A] Measurements L x W x D 0.2x0.2x0.3 [N/A:N/A] (cm) Area (cm) : [2:0.031] [N/A:N/A] Volume (cm) : [2:0.009] [N/A:N/A] % Reduction in Area: [2:50.80%] [N/A:N/A] % Reduction in Volume: 64.00% [N/A:N/A] Classification: [2:Grade 1] [N/A:N/A]  Exudate Amount: [2:Small] [N/A:N/A] Exudate Type: [2:Serous] [N/A:N/A] Exudate Color: [2:amber] [N/A:N/A] Wound Margin: [2:Flat and Intact] [N/A:N/A] Granulation Amount: [2:Large (67-100%)] [N/A:N/A] Granulation Quality: [2:Pink] [N/A:N/A] Necrotic Amount: [2:None Present (0%)] [N/A:N/A] Exposed Structures: [2:Fascia: No Fat: No] [N/A:N/A] Tendon: No Muscle: No Joint: No Bone: No Limited to Skin Breakdown Epithelialization: Small (1-33%) N/A N/A Periwound Skin Texture: Callus: Yes N/A N/A Edema: No Excoriation: No Induration: No Crepitus: No Fluctuance: No Friable: No Rash: No Scarring: No Periwound Skin Moist: Yes N/A N/A Moisture: Maceration: No Dry/Scaly: No Periwound Skin Color: Atrophie Blanche: No N/A N/A Cyanosis: No Ecchymosis: No Erythema: No Hemosiderin Staining: No Mottled: No Pallor: No Rubor: No Temperature: No Abnormality N/A N/A Tenderness on Yes N/A N/A Palpation: Wound Preparation: Ulcer  Cleansing: N/A N/A Rinsed/Irrigated with Saline Topical Anesthetic Applied: Other: lidocaine 4% Treatment Notes Electronic Signature(s) Signed: 08/13/2015 5:03:03 PM By: Alejandro Mulling Entered By: Alejandro Mulling on 08/13/2015 14:27:07 Nusser, [name] ([id number]) -------------------------------------------------------------------------------- Multi-Disciplinary Care Plan Details Patient Name: WYLLOW, SEIGLER. Date of Service: 08/13/2015 2:15 PM Medical Record Number: [id number] Patient Account Number: [id number] Date of Birth/Sex: 1950-12-31 (65 y.o. Female) Treating RN: Ashok Cordia, Debi Primary Care Physician: Aram Beecham Other Clinician: Referring Physician: Aram Beecham Treating Physician/Extender: BURNS III, Regis Bill in Treatment: 9 Active Inactive Wound/Skin Impairment Nursing Diagnoses: Impaired tissue integrity Goals: Patient/caregiver will verbalize understanding of skin care regimen Date Initiated: 06/11/2015 Goal Status: Active Ulcer/skin breakdown will have a volume reduction of 30% by week 4 Date Initiated: 06/11/2015 Goal Status: Active Ulcer/skin breakdown will have a volume reduction of 50% by week 8 Date Initiated: 06/11/2015 Goal Status: Active Ulcer/skin breakdown will have a volume reduction of 80% by week 12 Date Initiated: 06/11/2015 Goal Status: Active Ulcer/skin breakdown will heal within 14 weeks Date Initiated: 06/11/2015 Goal Status: Active Interventions: Assess patient/caregiver ability to obtain necessary supplies Assess ulceration(s) every visit Notes: Electronic Signature(s) Signed: 08/13/2015 5:03:03 PM By: Alejandro Mulling Entered By: Alejandro Mulling on 08/13/2015 14:26:49 Zuercher, Lateasha K. ([id number]) -------------------------------------------------------------------------------- Pain Assessment Details Patient Name: Dominique Marker K. Date of Service: 08/13/2015 2:15 PM Medical Record Number: [id number] Patient Account Number:  [id number] Date of Birth/Sex: 25-Nov-1950 (65 y.o. Female) Treating RN: Ashok Cordia, Debi Primary Care Physician: Aram Beecham Other Clinician: Referring Physician: Aram Beecham Treating Physician/Extender: BURNS III, Regis Bill in Treatment: 9 Active Problems Location of Pain Severity and Description of Pain Patient Has Paino Yes Site Locations Pain Location: Pain in Ulcers With Dressing Change: Yes Duration of the Pain. Constant / Intermittento Constant Rate the pain. Current Pain Level: 9 Character of Pain Describe the Pain: Throbbing Pain Management and Medication Current Pain Management: Electronic Signature(s) Signed: 08/13/2015 5:03:03 PM By: Alejandro Mulling Entered By: Alejandro Mulling on 08/13/2015 14:21:37 Stawicki, Lalani Kirtland Bouchard ([id number]) -------------------------------------------------------------------------------- Patient/Caregiver Education Details Patient Name: [name]. Date of Service: 08/13/2015 2:15 PM Medical Record Number: [id number] Patient Account Number: [id number] Date of Birth/Gender: 11/01/1950 (65 y.o. Female) Treating RN: Phillis Haggis Primary Care Physician: Aram Beecham Other Clinician: Referring Physician: Aram Beecham Treating Physician/Extender: BURNS III, Regis Bill in Treatment: 9 Education Assessment Education Provided To: Patient Education Topics Provided Wound/Skin Impairment: Handouts: Other: do not change dressing or get wet Methods: Demonstration, Explain/Verbal Responses: State content correctly Electronic Signature(s) Signed: 08/13/2015 5:03:03 PM By: Alejandro Mulling Entered By: Alejandro Mulling on 08/13/2015 14:54:47 Shirer, Talyah K. ([id number]) -------------------------------------------------------------------------------- Wound Assessment Details Patient Name: Dominique Marker K. Date of Service: 08/13/2015 2:15 PM Medical Record Number: [id number] Patient Account Number: [id number] Date of Birth/Sex:  October 18, 1950 (  65 y.o. Female) Treating RN: Ashok CordiaPinkerton, Debi Primary Care Physician: Aram BeechamSparks, Jeffrey Other Clinician: Referring Physician: Aram BeechamSparks, Jeffrey Treating Physician/Extender: BURNS III, Regis BillWALTER Weeks in Treatment: 9 Wound Status Wound Number: 2 Primary Diabetic Wound/Ulcer of the Lower Etiology: Extremity Wound Location: Left Foot - Plantar Wound Open Wounding Event: Trauma Status: Date Acquired: 05/21/2015 Comorbid Cataracts, Angina, Arrhythmia, Weeks Of Treatment: 9 History: Coronary Artery Disease, Hypertension, Clustered Wound: No Peripheral Arterial Disease, Type II Diabetes Photos Photo Uploaded By: Alejandro MullingPinkerton, Debra on 08/13/2015 15:10:08 Wound Measurements Length: (cm) 0.2 Width: (cm) 0.2 Depth: (cm) 0.3 Area: (cm) 0.031 Volume: (cm) 0.009 % Reduction in Area: 50.8% % Reduction in Volume: 64% Epithelialization: Small (1-33%) Tunneling: No Undermining: No Wound Description Classification: Grade 1 Wound Margin: Flat and Intact Exudate Amount: Small Exudate Type: Serous Exudate Color: amber Foul Odor After Cleansing: No Wound Bed Granulation Amount: Large (67-100%) Exposed Structure Granulation Quality: Pink Fascia Exposed: No Necrotic Amount: None Present (0%) Fat Layer Exposed: No Fodge, Brittannie K. ([id number]) Tendon Exposed: No Muscle Exposed: No Joint Exposed: No Bone Exposed: No Limited to Skin Breakdown Periwound Skin Texture Texture Color No Abnormalities Noted: No No Abnormalities Noted: No Callus: Yes Atrophie Blanche: No Crepitus: No Cyanosis: No Excoriation: No Ecchymosis: No Fluctuance: No Erythema: No Friable: No Hemosiderin Staining: No Induration: No Mottled: No Localized Edema: No Pallor: No Rash: No Rubor: No Scarring: No Temperature / Pain Moisture Temperature: No Abnormality No Abnormalities Noted: No Tenderness on Palpation: Yes Dry / Scaly: No Maceration: No Moist: Yes Wound Preparation Ulcer Cleansing:  Rinsed/Irrigated with Saline Topical Anesthetic Applied: Other: lidocaine 4%, Treatment Notes Wound #2 (Left, Plantar Foot) 1. Cleansed with: Clean wound with Normal Saline 2. Anesthetic Topical Lidocaine 4% cream to wound bed prior to debridement 3. Peri-wound Care: Skin Prep 5. Secondary Dressing Applied Foam Gauze and Kerlix/Conform Notes netting, darco shoe with peg assist Electronic Signature(s) Signed: 08/13/2015 5:03:03 PM By: Alejandro MullingPinkerton, Debra Entered By: Alejandro MullingPinkerton, Debra on 08/13/2015 14:26:36 Sharman, Hazell K. ([id number]) Lanes, [name] ([id number]) -------------------------------------------------------------------------------- Vitals Details Patient Name: Dominique MarkerPOTEAT, Murline K. Date of Service: 08/13/2015 2:15 PM Medical Record Number: [id number] Patient Account Number: [id number] Date of Birth/Sex: 1951-01-30 (65 y.o. Female) Treating RN: Ashok CordiaPinkerton, Debi Primary Care Physician: Aram BeechamSparks, Jeffrey Other Clinician: Referring Physician: Aram BeechamSparks, Jeffrey Treating Physician/Extender: BURNS III, Regis BillWALTER Weeks in Treatment: 9 Vital Signs Time Taken: 14:21 Temperature (F): 97.8 Height (in): 68 Pulse (bpm): 69 Weight (lbs): 154 Respiratory Rate (breaths/min): 18 Body Mass Index (BMI): 23.4 Blood Pressure (mmHg): 128/63 Reference Range: 80 - 120 mg / dl Electronic Signature(s) Signed: 08/13/2015 5:03:03 PM By: Alejandro MullingPinkerton, Debra Entered By: Alejandro MullingPinkerton, Debra on 08/13/2015 14:23:57

## 2015-08-14 NOTE — Progress Notes (Signed)
[name], [name] ([id number]) Visit Report for 08/13/2015 Chief Complaint Document Details Patient Name: [name], [name]. Date of Service: 08/13/2015 2:15 PM Medical Record Patient Account Number: [id number] Number: Treating RN: Huel Coventry January 25, 1951 (65 y.o. 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 Electronic Signature(s) Signed: 08/14/2015 8:51:38 AM By: Madelaine Bhat MD Entered By: Madelaine Bhat on 08/13/2015 14:39:54 [name], [name] ([id number]) -------------------------------------------------------------------------------- Cellular or Tissue Based Product Details Patient Name: [name], [name]. Date of Service: 08/13/2015 2:15 PM Medical Record Patient Account Number: [id number] Number: Treating RN: Huel Coventry 29-Oct-1950 (65 y.o. Other Clinician: Date of Birth/Sex: Female) Treating BURNS III, Primary Care Physician: Aram Beecham Physician/Extender: Zollie Beckers Referring Physician: Bing Plume in Treatment: 9 Cellular or Tissue Based Wound #2 Left,Plantar Foot Product Type Applied to: Performed By: Physician BURNS III, Melanie Crazier., MD Cellular or Tissue Based Epifix Product Type: Time-Out Taken: Yes Location: genitalia / hands / feet / multiple digits Wound Size (sq cm): 0.04 Product Size (sq cm): 2 Waste Size (sq cm): 0 Amount of Product Applied (sq cm): 2 Lot #: [id number] Order #: [id number] Expiration Date: 04/09/2020 Fenestrated: No Reconstituted: Yes Solution Type: saline Solution Amount: 1ml Lot #: [id number] Solution Expiration 12/07/2016 Date: Secured: Yes Secured With: Steri-Strips Dressing Applied: Yes Primary Dressing: mepitel one Procedural Pain: 0 Post  Procedural Pain: 0 Response to Treatment: Procedure was tolerated well Post Procedure Diagnosis Same as Pre-procedure Electronic Signature(s) Signed: 08/14/2015 8:51:38 AM By: Madelaine Bhat MD Entered By: Madelaine Bhat on 08/13/2015 14:42:21 [name], [name] ([id number]) [name], [name] ([id number]) -------------------------------------------------------------------------------- HPI Details Patient Name: Dominique Marker K. Date of Service: 08/13/2015 2:15 PM Medical Record Patient Account Number: [id number] Number: Treating RN: Huel Coventry February 26, 1951 (65 y.o. Other Clinician: Date of Birth/Sex: Female) Treating BURNS III, Primary Care Physician: Aram Beecham Physician/Extender: Zollie Beckers Referring Physician: Bing Plume in Treatment: 9 History of Present Illness HPI Description: Very pleasant 65 year old with past medical history significant for type 2 diabetes (hemoglobin A1c 7.3 in June 2016), peripheral neuropathy, peripheral vascular disease, and coronary artery disease. 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 She developed an ulceration in between her left third and fourth metatarsal heads in April 2014 after stepping on a piece of glass. She underwent LLE angioplasty in June 2014 by Dr. [name]. She was treated at the [facility] wound clinic with hyperbaric oxygen therapy for a deep tissue infection, which grew MRSA and did not significantly improve with antibiotics. X-rays showed no evidence for underlying osteomyelitis. She also underwent placement of Dermagraft x7. She completed a total of 60 HBO treatments at [facility] in Feb 2015. In-chamber TCOM >762mmHg. Her ulcer worsened after stopping HBO and she completed an additional 30 treatments (90 total) with significant improvement (eventually healed). She underwent angioplasty of her left peroneal artery, tibioperoneal  trunk, and superficial femoral artery on September 26, 2013. MRI on 10/12/13 showed no evidence for osteomyelitis or abscess. s/p Grafix x 5 and Epifix x 3 application with significant improvement. Significantly improved after starting to offload with knee walker, which she used at home but not in public for fear of falling. Also using darco shoe. Refused total contact cast secondary to fall history. Her ulcer was healed at  her last clinic visit in Feb 2016. She returned to clinic after stepping on a piece of plastic in October 2016. She developed a painful callus and was seen by Dr. Alberteen Spindle in podiatry. The callus was debrided, and she was found to have an underlying ulceration. X-ray reportedly showed no evidence for underlying osteomyelitis. Records requested. Repeat x-ray 07/16/2015 showed no evidence for osteomyelitis. She reports moderate pain with pressure. No claudication or rest pain. ABI 0.8. She has been applying silver alginate and offloading with a knee walker when possible and a postop shoe and new orthotics. Epifix applied 07/30/2015. No fever or chills. Minimal bloody drainage. Electronic Signature(s) Signed: 08/14/2015 8:51:38 AM By: Madelaine Bhat MD Entered By: Madelaine Bhat on 08/13/2015 14:40:09 [name], [name] [name] ([id number]) -------------------------------------------------------------------------------- Callus Pairing Details Patient Name: [name], [name]. Date of Service: 08/13/2015 2:15 PM Medical Record Patient Account Number: [id number] Number: Treating RN: Huel Coventry 01-08-1951 (65 y.o. Other Clinician: Date of Birth/Sex: Female) Treating BURNS III, Primary Care Physician: Aram Beecham Physician/Extender: Zollie Beckers Referring Physician: Bing Plume in Treatment: 9 Procedure Performed for: Wound #2 Left,Plantar Foot Performed By: Physician BURNS III, Melanie Crazier., MD Post Procedure Diagnosis Same as Pre-procedure Electronic  Signature(s) Signed: 08/14/2015 8:51:38 AM By: Madelaine Bhat MD Entered By: Madelaine Bhat on 08/13/2015 14:39:45 [name], [name] ([id number]) -------------------------------------------------------------------------------- Physical Exam Details Patient Name: [name], [name] Date of Service: 08/13/2015 2:15 PM Medical Record Patient Account Number: [id number] Number: Treating RN: Huel Coventry 02/03/1951 (65 y.o. Other Clinician: Date of Birth/Sex: Female) Treating BURNS III, Primary Care Physician: Aram Beecham Physician/Extender: Zollie Beckers Referring Physician: Bing Plume in Treatment: 9 Constitutional . Pulse regular. Respirations normal and unlabored. Afebrile. . Notes Left fourth metatarsal head plantar ulceration much improved with Epifix. Moderate surrounding callus. sharply debrided. Full-thickness. No exposed deep structures. No probe to bone. No cellulitis. No palpable pedal pulses per her baseline. Dopplerable DP and PT. Left ABI 0.8. Electronic Signature(s) Signed: 08/14/2015 8:51:38 AM By: Madelaine Bhat MD Entered By: Madelaine Bhat on 08/13/2015 14:41:03 [name], [name] ([id number]) -------------------------------------------------------------------------------- Physician Orders Details Patient Name: [name]. Date of Service: 08/13/2015 2:15 PM Medical Record Patient Account Number: [id number] Number: Treating RN: Huel Coventry May 23, 1951 (65 y.o. Other Clinician: Date of Birth/Sex: Female) Treating BURNS III, Primary Care Physician: Aram Beecham Physician/Extender: Zollie Beckers Referring Physician: Bing Plume in Treatment: 9 Verbal / Phone Orders: Yes Clinician: Huel Coventry Read Back and Verified: Yes Diagnosis Coding ICD-10 Coding Code Description E11.621 Type 2 diabetes mellitus with foot ulcer E11.40 Type 2 diabetes mellitus with diabetic neuropathy, unspecified I70.245 Atherosclerosis of native  arteries of left leg with ulceration of other part of foot I25.10 Atherosclerotic heart disease of native coronary artery without angina pectoris Wound Cleansing Wound #2 Left,Plantar Foot o Other: - Do not get wet or remove dressing. Anesthetic Wound #2 Left,Plantar Foot o Topical Lidocaine 4% cream applied to wound bed prior to debridement Dressing Change Frequency Wound #2 Left,Plantar Foot o Change dressing every week Follow-up Appointments Wound #2 Left,Plantar Foot o Return Appointment in 1 week. Off-Loading Wound #2 Left,Plantar Foot o Open toe surgical shoe with peg assist. Advanced Therapies Wound #2 Left,Plantar Foot o EpiFix application in clinic; including contact layer, fixation with steri strips, dry gauze and cover dressing. - layer of foam for additional protecrtion GIONNI, FREESE ([id number]) Electronic Signature(s) Signed: 08/13/2015 5:28:49 PM By: Elliot Gurney RN, BSN, Kim RN, BSN Signed: 08/14/2015 8:51:38 AM By: Reino Bellis,  Zollie Beckers MD Entered By: Elliot Gurney, RN, BSN, Kim on 08/13/2015 14:44:13 Welp, [name] ([id number]) -------------------------------------------------------------------------------- Problem List Details Patient Name: [name], MAULTSBY. Date of Service: 08/13/2015 2:15 PM Medical Record Patient Account Number: [id number] Number: Treating RN: Huel Coventry Aug 12, 1950 (65 y.o. Other Clinician: Date of Birth/Sex: Female) Treating BURNS III, Primary Care Physician: Aram Beecham Physician/Extender: Zollie Beckers Referring Physician: Bing Plume in Treatment: 9 Active Problems ICD-10 Encounter Code Description Active Date Diagnosis E11.621 Type 2 diabetes mellitus with foot ulcer 06/11/2015 Yes E11.40 Type 2 diabetes mellitus with diabetic neuropathy, 06/11/2015 Yes unspecified I70.245 Atherosclerosis of native arteries of left leg with ulceration 06/11/2015 Yes of other part of foot I25.10 Atherosclerotic heart disease of  native coronary artery 06/11/2015 Yes without angina pectoris Inactive Problems Resolved Problems Electronic Signature(s) Signed: 08/14/2015 8:51:38 AM By: Madelaine Bhat MD Entered By: Madelaine Bhat on 08/13/2015 14:39:32 Humble, Chelsa K. ([id number]) -------------------------------------------------------------------------------- Progress Note Details Patient Name: Dominique Marker K. Date of Service: 08/13/2015 2:15 PM Medical Record Patient Account Number: [id number] Number: Treating RN: Huel Coventry December 05, 1950 (66 y.o. 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 History of Present Illness (HPI) Very pleasant 65 year old with past medical history significant for type 2 diabetes (hemoglobin A1c 7.3 in June 2016), peripheral neuropathy, peripheral vascular disease, and coronary artery disease. 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 She developed an ulceration in between her left third and fourth metatarsal heads in April 2014 after stepping on a piece of glass. She underwent LLE angioplasty in June 2014 by Dr. [name]. She was treated at the [facility] wound clinic with hyperbaric oxygen therapy for a deep tissue infection, which grew MRSA and did not significantly improve with antibiotics. X-rays showed no evidence for underlying osteomyelitis. She also underwent placement of Dermagraft x7. She completed a total of 60 HBO treatments at [facility] in Feb 2015. In-chamber TCOM >750mmHg. Her ulcer worsened after stopping HBO and she completed an additional 30 treatments (90 total) with significant improvement (eventually  healed). She underwent angioplasty of her left peroneal artery, tibioperoneal trunk, and superficial femoral artery on September 26, 2013. MRI on 10/12/13 showed no evidence for osteomyelitis or abscess. s/p Grafix x 5 and Epifix x 3 application with significant improvement. Significantly improved after starting to offload with knee walker, which she used at home but not in public for fear of falling. Also using darco shoe. Refused total contact cast secondary to fall history. Her ulcer was healed at her last clinic visit in Feb 2016. She returned to clinic after stepping on a piece of plastic in October 2016. She developed a painful callus and was seen by Dr. Alberteen Spindle in podiatry. The callus was debrided, and she was found to have an underlying ulceration. X-ray reportedly showed no evidence for underlying osteomyelitis. Records requested. Repeat x-ray 07/16/2015 showed no evidence for osteomyelitis. She reports moderate pain with pressure. No claudication or rest pain. ABI 0.8. She has been applying silver alginate and offloading with a knee walker when possible and a postop shoe and new orthotics. Epifix applied 07/30/2015. No fever or chills. Minimal bloody drainage. CHELCEA, ZAHN K. ([id number]) Objective Constitutional Pulse regular. Respirations normal and unlabored. Afebrile. Vitals Time Taken: 2:21 PM, Height: 68 in, Weight:  154 lbs, BMI: 23.4, Temperature: 97.8 F, Pulse: 69 bpm, Respiratory Rate: 18 breaths/min, Blood Pressure: 128/63 mmHg. General Notes: Left fourth metatarsal head plantar ulceration much improved with Epifix. Moderate surrounding callus. sharply debrided. Full-thickness. No exposed deep structures. No probe to bone. No cellulitis. No palpable pedal pulses per her baseline. Dopplerable DP and PT. Left ABI 0.8. Integumentary (Hair, Skin) Wound #2 status is Open. Original cause of wound was Trauma. The wound is located on the Left,Plantar Foot. The wound measures 0.2cm  length x 0.2cm width x 0.3cm depth; 0.031cm^2 area and 0.009cm^3 volume. The wound is limited to skin breakdown. There is no tunneling or undermining noted. There is a small amount of serous drainage noted. The wound margin is flat and intact. There is large (67-100%) pink granulation within the wound bed. There is no necrotic tissue within the wound bed. The periwound skin appearance exhibited: Callus, Moist. The periwound skin appearance did not exhibit: Crepitus, Excoriation, Fluctuance, Friable, Induration, Localized Edema, Rash, Scarring, Dry/Scaly, Maceration, Atrophie Blanche, Cyanosis, Ecchymosis, Hemosiderin Staining, Mottled, Pallor, Rubor, Erythema. Periwound temperature was noted as No Abnormality. The periwound has tenderness on palpation. 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 Arterial insufficiency. Procedures KEZIA, BENEVIDES K. ([id number]) Wound #2 Wound #2 is a Diabetic Wound/Ulcer of the Lower Extremity located on the Left,Plantar Foot. A skin graft procedure using a bioengineered skin substitute/cellular or tissue based product was performed by BURNS III, Melanie Crazier., MD. Epifix was applied and secured with Steri-Strips. 2 sq cm of product was utilized and 0 sq cm was wasted. Post Application, mepitel one was applied. A Time Out was conducted prior to the start of the procedure. The procedure was tolerated well with a pain level of 0 throughout and a pain level of 0 following the procedure. Post procedure Diagnosis Wound #2: Same as Pre-Procedure . Wound #2 is a Diabetic Wound/Ulcer of the Lower Extremity located on the  Left,Plantar Foot . An Callus Pairing procedure was performed by BURNS III, Melanie Crazier., MD. Post procedure Diagnosis Wound #2: Same as Pre-Procedure Plan Epifix applied today. Continue offloading measures. Electronic Signature(s) Signed: 08/14/2015 8:51:38 AM By: Madelaine Bhat MD Entered By: Madelaine Bhat on 08/13/2015 14:42:45 Buren, Pami [name] [name] ([id number]) -------------------------------------------------------------------------------- SuperBill Details Patient Name: [name]. Date of Service: 08/13/2015 Medical Record Patient Account Number: [id number] Number: Treating RN: Huel Coventry [birth year] (65 y.o. Other Clinician: Date of Birth/Sex: Female) Treating BURNS III, Primary Care Physician: Aram Beecham Physician/Extender: Zollie Beckers Referring Physician: Bing Plume in Treatment: 9 Diagnosis Coding ICD-10 Codes Code Description E11.621 Type 2 diabetes mellitus with foot ulcer E11.40 Type 2 diabetes mellitus with diabetic neuropathy, unspecified I70.245 Atherosclerosis of native arteries of left leg with ulceration of other part of foot I25.10 Atherosclerotic heart disease of native coronary artery without angina pectoris Facility Procedures CPT4 Code: 29562130 Description: (Facility Use Only) Q4131 o Epifix o Per 1 SQ CM Modifier: Quantity: 2 CPT4 Code: 86578469 Description: 15275 - SKIN SUB GRAFT FACE/NK/HF/G ICD-10 Description Diagnosis E11.621 Type 2 diabetes mellitus with foot ulcer Modifier: Quantity: 1 CPT4 Code: [id number] Description: 11055 - PARE BENIGN LES; SGL ICD-10 Description Diagnosis E11.621 Type 2 diabetes mellitus with foot ulcer Modifier:  Quantity: 1 Physician Procedures CPT4 Code: 29562136770630 Description: 15275 - WC PHYS SKIN SUB GRAFT FACE/NK/HF/G ICD-10 Description Diagnosis E11.621 Type 2 diabetes mellitus with foot ulcer Modifier: Quantity: 1 CPT4 Code: [id number] Duff, Isabell Description: 11055 - WC PHYS PARE BENIGN  LES; SGL ICD-10 Description Diagnosis E11.621 Type 2 diabetes mellitus with foot ulcer K. ([id number]) Modifier: Quantity: 1 Electronic Signature(s) Signed: 08/14/2015 8:51:38 AM By: Madelaine BhatBurns, III, Walter MD Entered By: Madelaine BhatBurns, III, Walter on 08/13/2015 14:42:02

## 2015-08-20 ENCOUNTER — Encounter: Payer: Medicare Other | Admitting: Surgery

## 2015-08-20 DIAGNOSIS — E11621 Type 2 diabetes mellitus with foot ulcer: Secondary | ICD-10-CM | POA: Diagnosis not present

## 2015-08-20 NOTE — Progress Notes (Addendum)
Dominique MaffucciOTEAT, Breyon K. ([id number]) Visit Report for 08/20/2015 Arrival Information Details Patient Name: Dominique MaffucciOTEAT, [name] Date of Service: 08/20/2015 1:30 PM Medical Record Number: [id number] Patient Account Number: [id number] Date of Birth/Sex: 05/23/1951 (65 y.o. Female) Treating RN: Afful, RN, BSN, [place] Sinkita Primary Care Physician: Aram BeechamSparks, Jeffrey Other Clinician: Referring Physician: Aram BeechamSparks, Jeffrey Treating Physician/Extender: BURNS III, Regis BillWALTER Weeks in Treatment: 10 Visit Information History Since Last Visit Added or deleted any medications: No Patient Arrived: Ambulatory Any new allergies or adverse reactions: No Arrival Time: 13:28 Had a fall or experienced change in No Accompanied By: self activities of daily living that may affect Transfer Assistance: None risk of falls: Patient Identification Verified: Yes Signs or symptoms of abuse/neglect since last No Secondary Verification Process Yes visito Completed: Hospitalized since last visit: No Patient Requires Transmission- No Has Dressing in Place as Prescribed: Yes Based Precautions: Pain Present Now: No Patient Has Alerts: Yes Patient Alerts: Patient on Blood Thinner DMII warfarin Electronic Signature(s) Signed: 08/20/2015 1:28:24 PM By: Elpidio EricAfful, Rita BSN, RN Entered By: Elpidio EricAfful, Rita on 08/20/2015 13:28:24 Dominique MaffucciPOTEAT, Gwendolynn K. ([id number]) -------------------------------------------------------------------------------- Encounter Discharge Information Details Patient Name: [name], Jamela K. Date of Service: 08/20/2015 1:30 PM Medical Record Number: [id number] Patient Account Number: [id number] Date of Birth/Sex: 05/23/1951 (65 y.o. Female) Treating RN: Clover MealyAfful, RN, BSN, [place] Sinkita Primary Care Physician: Aram BeechamSparks, Jeffrey Other Clinician: Referring Physician: Aram BeechamSparks, Jeffrey Treating Physician/Extender: BURNS III, Regis BillWALTER Weeks in Treatment: 10 Encounter Discharge Information Items Discharge Pain Level: 0 Discharge Condition:  Stable Ambulatory Status: Ambulatory Discharge Destination: Home Transportation: Private Auto Accompanied By: self Schedule Follow-up Appointment: No Medication Reconciliation completed and provided to Patient/Care No Saphira Lahmann: Provided on Clinical Summary of Care: 08/20/2015 Form Type Recipient Paper Patient PP Electronic Signature(s) Signed: 08/20/2015 4:39:12 PM By: Elpidio EricAfful, Rita BSN, RN Previous Signature: 08/20/2015 2:04:16 PM Version By: Gwenlyn PerkingMoore, Shelia Entered By: Elpidio EricAfful, Rita on 08/20/2015 14:05:09 Hoeffner, Timberly K. ([id number]) -------------------------------------------------------------------------------- Lower Extremity Assessment Details Patient Name: [name], [name] Date of Service: 08/20/2015 1:30 PM Medical Record Number: [id number] Patient Account Number: [id number] Date of Birth/Sex: 05/23/1951 (65 y.o. Female) Treating RN: Afful, RN, BSN, [place] Sinkita Primary Care Physician: Aram BeechamSparks, Jeffrey Other Clinician: Referring Physician: Aram BeechamSparks, Jeffrey Treating Physician/Extender: BURNS III, Regis BillWALTER Weeks in Treatment: 10 Vascular Assessment Pulses: Posterior Tibial Dorsalis Pedis Palpable: [Left:Yes] Extremity colors, hair growth, and conditions: Extremity Color: [Left:Normal] Hair Growth on Extremity: [Left:No] Temperature of Extremity: [Left:Warm] Capillary Refill: [Left:< 3 seconds] Toe Nail Assessment Left: Right: Thick: No Discolored: No Deformed: No Improper Length and Hygiene: No Electronic Signature(s) Signed: 08/20/2015 1:28:58 PM By: Elpidio EricAfful, Rita BSN, RN Entered By: Elpidio EricAfful, Rita on 08/20/2015 13:28:58 Ciolek, [name] ([id number]) -------------------------------------------------------------------------------- Multi Wound Chart Details Patient Name: [name], Teea K. Date of Service: 08/20/2015 1:30 PM Medical Record Number: [id number] Patient Account Number: [id number] Date of Birth/Sex: 05/23/1951 (65 y.o. Female) Treating RN: Clover MealyAfful, RN, BSN, [place] Sinkita Primary Care  Physician: Aram BeechamSparks, Jeffrey Other Clinician: Referring Physician: Aram BeechamSparks, Jeffrey Treating Physician/Extender: BURNS III, Regis BillWALTER Weeks in Treatment: 10 Vital Signs Height(in): 68 Pulse(bpm): 71 Weight(lbs): 154 Blood Pressure 186/69 (mmHg): Body Mass Index(BMI): 23 Temperature(F): 98.2 Respiratory Rate 18 (breaths/min): Photos: [2:No Photos] [N/A:N/A] Wound Location: [2:Left Foot - Plantar] [N/A:N/A] Wounding Event: [2:Trauma] [N/A:N/A] Primary Etiology: [2:Diabetic Wound/Ulcer of the Lower Extremity] [N/A:N/A] Comorbid History: [2:Cataracts, Angina, Arrhythmia, Coronary Artery Disease, Hypertension, Peripheral Arterial Disease, Type II Diabetes] [N/A:N/A] Date Acquired: [2:05/21/2015] [N/A:N/A] Weeks of Treatment: [2:10] [N/A:N/A] Wound Status: [2:Open] [N/A:N/A] Measurements L x W x D 0.4x0.3x0.2 [N/A:N/A] (cm) Area (cm) : [2:0.094] [N/A:N/A] Volume (cm) : [  2:0.019] [N/A:N/A] % Reduction in Area: [2:-49.20%] [N/A:N/A] % Reduction in Volume: 24.00% [N/A:N/A] Classification: [2:Grade 1] [N/A:N/A] Exudate Amount: [2:Small] [N/A:N/A] Exudate Type: [2:Serous] [N/A:N/A] Exudate Color: [2:amber] [N/A:N/A] Wound Margin: [2:Flat and Intact] [N/A:N/A] Granulation Amount: [2:Large (67-100%)] [N/A:N/A] Granulation Quality: [2:Pink] [N/A:N/A] Necrotic Amount: [2:None Present (0%)] [N/A:N/A] Exposed Structures: [2:Fascia: No Fat: No] [N/A:N/A] Tendon: No Muscle: No Joint: No Bone: No Limited to Skin Breakdown Epithelialization: Small (1-33%) N/A N/A Periwound Skin Texture: Callus: Yes N/A N/A Edema: No Excoriation: No Induration: No Crepitus: No Fluctuance: No Friable: No Rash: No Scarring: No Periwound Skin Moist: Yes N/A N/A Moisture: Maceration: No Dry/Scaly: No Periwound Skin Color: Atrophie Blanche: No N/A N/A Cyanosis: No Ecchymosis: No Erythema: No Hemosiderin Staining: No Mottled: No Pallor: No Rubor: No Temperature: No Abnormality N/A  N/A Tenderness on Yes N/A N/A Palpation: Wound Preparation: Ulcer Cleansing: N/A N/A Rinsed/Irrigated with Saline Topical Anesthetic Applied: Other: lidocaine 4% Treatment Notes Electronic Signature(s) Signed: 08/20/2015 4:39:12 PM By: Elpidio Eric BSN, RN Entered By: Elpidio Eric on 08/20/2015 13:37:40 Homesley, [name] ([id number]) -------------------------------------------------------------------------------- Multi-Disciplinary Care Plan Details Patient Name: ALIRA, FRETWELL. Date of Service: 08/20/2015 1:30 PM Medical Record Number: [id number] Patient Account Number: [id number] Date of Birth/Sex: [birth year] (64 y.o. Female) Treating RN: Clover Mealy, RN, BSN, [place] Sink Primary Care Physician: Aram Beecham Other Clinician: Referring Physician: Aram Beecham Treating Physician/Extender: BURNS III, Regis Bill in Treatment: 10 Active Inactive Wound/Skin Impairment Nursing Diagnoses: Impaired tissue integrity Goals: Patient/caregiver will verbalize understanding of skin care regimen Date Initiated: 06/11/2015 Goal Status: Active Ulcer/skin breakdown will have a volume reduction of 30% by week 4 Date Initiated: 06/11/2015 Goal Status: Active Ulcer/skin breakdown will have a volume reduction of 50% by week 8 Date Initiated: 06/11/2015 Goal Status: Active Ulcer/skin breakdown will have a volume reduction of 80% by week 12 Date Initiated: 06/11/2015 Goal Status: Active Ulcer/skin breakdown will heal within 14 weeks Date Initiated: 06/11/2015 Goal Status: Active Interventions: Assess patient/caregiver ability to obtain necessary supplies Assess ulceration(s) every visit Notes: Electronic Signature(s) Signed: 08/20/2015 4:39:12 PM By: Elpidio Eric BSN, RN Entered By: Elpidio Eric on 08/20/2015 13:37:29 Hegel, Tamu K. ([id number]) -------------------------------------------------------------------------------- Pain Assessment Details Patient Name: Suanne [name] K. Date of Service:  08/20/2015 1:30 PM Medical Record Number: [id number] Patient Account Number: [id number] Date of Birth/Sex: 1950-12-18 (64 y.o. Female) Treating RN: Clover Mealy, RN, BSN, [place] Sink Primary Care Physician: Aram Beecham Other Clinician: Referring Physician: Aram Beecham Treating Physician/Extender: BURNS III, Regis Bill in Treatment: 10 Active Problems Location of Pain Severity and Description of Pain Patient Has Paino No Site Locations Pain Management and Medication Current Pain Management: Electronic Signature(s) Signed: 08/20/2015 1:28:31 PM By: Elpidio Eric BSN, RN Entered By: Elpidio Eric on 08/20/2015 13:28:30 Spikes, [name] ([id number]) -------------------------------------------------------------------------------- Patient/Caregiver Education Details Patient Name: [name]. Date of Service: 08/20/2015 1:30 PM Medical Record Number: [id number] Patient Account Number: [id number] Date of Birth/Gender: Aug 05, 1951 (65 y.o. Female) Treating RN: Clover Mealy, RN, BSN,  Sink Primary Care Physician: Aram Beecham Other Clinician: Referring Physician: Aram Beecham Treating Physician/Extender: BURNS III, Regis Bill in Treatment: 10 Education Assessment Education Provided To: Patient Education Topics Provided Basic Hygiene: Methods: Explain/Verbal Responses: State content correctly Wound/Skin Impairment: Methods: Explain/Verbal Responses: State content correctly Electronic Signature(s) Signed: 08/20/2015 4:39:12 PM By: Elpidio Eric BSN, RN Entered By: Elpidio Eric on 08/20/2015 14:05:24 Mcguirk, [name] ([id number]) -------------------------------------------------------------------------------- Wound Assessment Details Patient Name: Suanne [name] K. Date of Service: 08/20/2015 1:30 PM Medical Record Number: [id number] Patient Account Number: [id number] Date of Birth/Sex: 10/27/1950 (64 y.o.  Female) Treating RN: Afful, RN, BSN, Rita Primary Care Physician: Aram Beecham Other  Clinician: Referring Physician: Aram Beecham Treating Physician/Extender: BURNS III, Regis Bill in Treatment: 10 Wound Status Wound Number: 2 Primary Diabetic Wound/Ulcer of the Lower Etiology: Extremity Wound Location: Left Foot - Plantar Wound Open Wounding Event: Trauma Status: Date Acquired: 05/21/2015 Comorbid Cataracts, Angina, Arrhythmia, Weeks Of Treatment: 10 History: Coronary Artery Disease, Hypertension, Clustered Wound: No Peripheral Arterial Disease, Type II Diabetes Photos Photo Uploaded By: Elpidio Eric on 08/20/2015 16:19:12 Wound Measurements Length: (cm) 0.4 Width: (cm) 0.3 Depth: (cm) 0.2 Area: (cm) 0.094 Volume: (cm) 0.019 % Reduction in Area: -49.2% % Reduction in Volume: 24% Epithelialization: Small (1-33%) Tunneling: No Undermining: No Wound Description Classification: Grade 1 Wound Margin: Flat and Intact Exudate Amount: Small Exudate Type: Serous Exudate Color: amber Foul Odor After Cleansing: No Wound Bed Granulation Amount: Large (67-100%) Exposed Structure Granulation Quality: Pink Fascia Exposed: No Necrotic Amount: None Present (0%) Fat Layer Exposed: No Lacivita, Rickiya K. ([id number]) Tendon Exposed: No Muscle Exposed: No Joint Exposed: No Bone Exposed: No Limited to Skin Breakdown Periwound Skin Texture Texture Color No Abnormalities Noted: No No Abnormalities Noted: No Callus: Yes Atrophie Blanche: No Crepitus: No Cyanosis: No Excoriation: No Ecchymosis: No Fluctuance: No Erythema: No Friable: No Hemosiderin Staining: No Induration: No Mottled: No Localized Edema: No Pallor: No Rash: No Rubor: No Scarring: No Temperature / Pain Moisture Temperature: No Abnormality No Abnormalities Noted: No Tenderness on Palpation: Yes Dry / Scaly: No Maceration: No Moist: Yes Wound Preparation Ulcer Cleansing: Rinsed/Irrigated with Saline Topical Anesthetic Applied: Other: lidocaine 4%, Treatment Notes Wound #2  (Left, Plantar Foot) 1. Cleansed with: Clean wound with Normal Saline 3. Peri-wound Care: Skin Prep 4. Dressing Applied: Other dressing (specify in notes) 5. Secondary Dressing Applied Gauze and Kerlix/Conform 7. Secured with Other (specify in notes) Notes epifix applied, secxured with coban, darco shoe with peg assist Electronic Signature(s) Signed: 08/20/2015 4:39:12 PM By: Elpidio Eric BSN, RN Entered By: Elpidio Eric on 08/20/2015 13:37:20 LYVONNE, CASSELL ([id number]) Neyland, [name] ([id number]) -------------------------------------------------------------------------------- Vitals Details Patient Name: Suanne [name] K. Date of Service: 08/20/2015 1:30 PM Medical Record Number: [id number] Patient Account Number: [id number] Date of Birth/Sex: 09-Mar-1951 (64 y.o. Female) Treating RN: Afful, RN, BSN, Rita Primary Care Physician: Aram Beecham Other Clinician: Referring Physician: Aram Beecham Treating Physician/Extender: BURNS III, Regis Bill in Treatment: 10 Vital Signs Time Taken: 13:31 Temperature (F): 98.2 Height (in): 68 Pulse (bpm): 71 Weight (lbs): 154 Respiratory Rate (breaths/min): 18 Body Mass Index (BMI): 23.4 Blood Pressure (mmHg): 186/69 Reference Range: 80 - 120 mg / dl Electronic Signature(s) Signed: 08/20/2015 4:39:12 PM By: Elpidio Eric BSN, RN Entered By: Elpidio Eric on 08/20/2015 13:34:05

## 2015-08-21 NOTE — Progress Notes (Signed)
TAIS, KOESTNER ([id number]) Visit Report for 08/20/2015 Chief Complaint Document Details Patient Name: [name], [name]. Date of Service: 08/20/2015 1:30 PM Medical Record Patient Account Number: [id number] Number: Treating RN: Clover Mealy, RN, BSN, Rita 08-23-1950 [phone] y.o. 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 Electronic Signature(s) Signed: 08/20/2015 3:52:06 PM By: Madelaine Bhat MD Entered By: Madelaine Bhat on 08/20/2015 14:13:41 Deitrick, [name] ([id number]) -------------------------------------------------------------------------------- Cellular or Tissue Based Product Details Patient Name: [name]. Date of Service: 08/20/2015 1:30 PM Medical Record Patient Account Number: [id number] Number: Treating RN: Clover Mealy, RN, BSN, Rita 1951/04/13 [phone] y.o. Other Clinician: Date of Birth/Sex: Female) Treating BURNS III, Primary Care Physician: Aram Beecham Physician/Extender: Zollie Beckers Referring Physician: Bing Plume in Treatment: 10 Cellular or Tissue Based Wound #2 Left,Plantar Foot Product Type Applied to: Performed By: Physician BURNS III, Melanie Crazier., MD Cellular or Tissue Based Epifix Product Type: Time-Out Taken: Yes Location: genitalia / hands / feet / multiple digits Wound Size (sq cm): 0.12 Product Size (sq cm): 2 Waste Size (sq cm): 0 Amount of Product Applied (sq cm): 2 Lot #: [id number] Order #: [id number] Expiration Date: 04/09/2020 Fenestrated: No Reconstituted: Yes Solution Type: SAline Solution Amount: 1 Lot #: [id number] Solution Expiration 01/08/2016 Date: Secured: Yes Secured With: Steri-Strips Dressing Applied: Yes Primary Dressing: mepitel 1 Procedural  Pain: 0 Post Procedural Pain: 0 Response to Treatment: Procedure was tolerated well Post Procedure Diagnosis Same as Pre-procedure Electronic Signature(s) Signed: 08/20/2015 3:52:06 PM By: Madelaine Bhat MD Previous Signature: 08/20/2015 2:11:16 PM Version By: Elpidio Eric BSN, RN Sarin, Zanasia KMarland Kitchen ([id number]) Entered By: Madelaine Bhat on 08/20/2015 14:13:31 Lundin, Taisa K. ([id number]) -------------------------------------------------------------------------------- Debridement Details Patient Name: Dominique Marker K. Date of Service: 08/20/2015 1:30 PM Medical Record Patient Account Number: [id number] Number: Treating RN: Clover Mealy, RN, BSN, Rita 1950/09/24 [phone] y.o. Other Clinician: Date of Birth/Sex: Female) Treating BURNS III, Primary Care Physician: Aram Beecham Physician/Extender: Zollie Beckers Referring Physician: Bing Plume in Treatment: 10 Debridement Performed for Wound #2 Left,Plantar Foot Assessment: Performed By: Physician BURNS III, Melanie Crazier., MD Debridement: Debridement Pre-procedure Yes Verification/Time Out Taken: Start Time: 13:48 Pain Control: Lidocaine 4% Topical Solution Level: Skin/Subcutaneous Tissue Total Area Debrided (L x 0.4 (cm) x 0.4 (cm) = 0.16 (cm) W): Tissue and other Viable, Non-Viable, Callus, Fat, Fibrin/Slough, Subcutaneous material debrided: Instrument: Blade, Curette Bleeding: Minimum Hemostasis Achieved: Pressure End Time: 13:53 Procedural Pain: 0 Post Procedural Pain: 0 Response to Treatment: Procedure was tolerated well Post Debridement Measurements of Total Wound Length: (cm) 0.4 Width: (cm) 0.4 Depth: (cm) 0.3 Volume: (cm) 0.038 Post Procedure Diagnosis Same as Pre-procedure Electronic Signature(s) Signed: 08/20/2015 3:52:06 PM By: Madelaine Bhat MD Signed: 08/20/2015 4:39:12 PM By: Elpidio Eric BSN, RN Entered By: Madelaine Bhat on 08/20/2015 14:13:21 Sandford, Lylla K.  ([id number]) -------------------------------------------------------------------------------- HPI Details Patient Name: Dominique Marker K. Date of Service: 08/20/2015 1:30 PM Medical Record Patient Account Number: [id number] Number: Treating RN: Clover Mealy, RN, BSN, Rita October 27, 1950 [phone] y.o. Other Clinician: Date of Birth/Sex: Female) Treating BURNS III, Primary Care Physician: Aram Beecham Physician/Extender: Zollie Beckers Referring Physician: Bing Plume in Treatment: 10 History of Present Illness HPI Description: Very pleasant 65 year old with past medical history significant for type 2 diabetes (hemoglobin A1c 7.3 in June 2016), peripheral neuropathy, peripheral vascular disease, and  coronary artery disease. 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 She developed an ulceration in between her left third and fourth metatarsal heads in April 2014 after stepping on a piece of glass. She underwent LLE angioplasty in June 2014 by Dr. [name]. She was treated at the [facility] wound clinic with hyperbaric oxygen therapy for a deep tissue infection, which grew MRSA and did not significantly improve with antibiotics. X-rays showed no evidence for underlying osteomyelitis. She also underwent placement of Dermagraft x7. She completed a total of 60 HBO treatments at [facility] in Feb 2015. In-chamber TCOM >785mmHg. Her ulcer worsened after stopping HBO and she completed an additional 30 treatments (90 total) with significant improvement (eventually healed). She underwent angioplasty of her left peroneal artery, tibioperoneal trunk, and superficial femoral artery on September 26, 2013. MRI on 10/12/13 showed no evidence for osteomyelitis or abscess. s/p Grafix x 5 and Epifix x 3 application with significant improvement. Significantly improved after starting to offload with knee walker, which she used at home but not  in public for fear of falling. Also using darco shoe. Refused total contact cast secondary to fall history. Her ulcer was healed at her last clinic visit in Feb 2016. She returned to clinic after stepping on a piece of plastic in October 2016. She developed a painful callus and was seen by Dr. Alberteen Spindle in podiatry. The callus was debrided, and she was found to have an underlying ulceration. X-ray reportedly showed no evidence for underlying osteomyelitis. Records requested. Repeat x-ray 07/16/2015 showed no evidence for osteomyelitis. She reports moderate pain with pressure. No claudication or rest pain. ABI 0.8. She has been applying silver alginate and offloading with a knee walker when possible and a postop shoe and new orthotics. Epifix initially applied 07/30/2015. No fever or chills. Minimal bloody drainage. Electronic Signature(s) Signed: 08/20/2015 3:52:06 PM By: Madelaine Bhat MD Entered By: Madelaine Bhat on 08/20/2015 14:14:41 Santini, [name] ([id number]) -------------------------------------------------------------------------------- Physical Exam Details Patient Name: LAREEN, MULLINGS K. Date of Service: 08/20/2015 1:30 PM Medical Record Patient Account Number: [id number] Number: Treating RN: Clover Mealy, RN, BSN, Rita 02/25/1951 [phone] y.o. Other Clinician: Date of Birth/Sex: Female) Treating BURNS III, Primary Care Physician: Aram Beecham Physician/Extender: Zollie Beckers Referring Physician: Bing Plume in Treatment: 10 Constitutional . Pulse regular. Respirations normal and unlabored. Afebrile. . Notes Left fourth metatarsal head plantar ulceration improved with Epifix. Moderate surrounding callus. sharply debrided. Full-thickness. No exposed deep structures. No probe to bone. No cellulitis. No palpable pedal pulses per her baseline. Dopplerable DP and PT. Left ABI 0.8. Electronic Signature(s) Signed: 08/20/2015 3:52:06 PM By: Madelaine Bhat MD Entered  By: Madelaine Bhat on 08/20/2015 14:15:12 Garzon, [name] ([id number]) -------------------------------------------------------------------------------- Physician Orders Details Patient Name: [name]. Date of Service: 08/20/2015 1:30 PM Medical Record Patient Account Number: [id number] Number: Treating RN: Clover Mealy, RN, BSN, Rita February 20, 1951 [phone] y.o. Other Clinician: Date of Birth/Sex: Female) Treating BURNS III, Primary Care Physician: Aram Beecham Physician/Extender: Zollie Beckers Referring Physician: Bing Plume in Treatment: 10 Verbal / Phone Orders: Yes Clinician: Afful, RN, BSN, Rita Read Back and Verified: Yes Diagnosis Coding Wound Cleansing Wound #2 Left,Plantar Foot o Other: - Do not get wet or remove dressing. Anesthetic Wound #2 Left,Plantar Foot o Topical Lidocaine 4% cream applied to wound bed prior to debridement Dressing Change Frequency Wound #2 Left,Plantar Foot o Change dressing every week Follow-up Appointments Wound #2 Left,Plantar  Foot o Return Appointment in 1 week. Off-Loading Wound #2 Left,Plantar Foot o Open toe surgical shoe with peg assist. Advanced Therapies Wound #2 Left,Plantar Foot o EpiFix application in clinic; including contact layer, fixation with steri strips, dry gauze and cover dressing. - layer of foam for additional protection Electronic Signature(s) Signed: 08/20/2015 3:52:06 PM By: Madelaine BhatBurns, III, Brandom Kerwin MD Signed: 08/20/2015 4:39:12 PM By: Elpidio EricAfful, Rita BSN, RN Entered By: Elpidio EricAfful, Rita on 08/20/2015 13:51:46 Knotts, Jannet K. ([id number]) -------------------------------------------------------------------------------- Problem List Details Patient Name: Dominique MarkerOTEAT, Ramsha K. Date of Service: 08/20/2015 1:30 PM Medical Record Patient Account Number: [id number] Number: Treating RN: Clover MealyAfful, RN, BSN, Rita 11/27/1950 [phone](64 y.o. Other Clinician: Date of Birth/Sex: Female) Treating BURNS III, Primary  Care Physician: Aram BeechamSparks, Jeffrey Physician/Extender: Zollie BeckersWALTER Referring Physician: Bing PlumeSparks, Jeffrey Weeks in Treatment: 10 Active Problems ICD-10 Encounter Code Description Active Date Diagnosis E11.621 Type 2 diabetes mellitus with foot ulcer 06/11/2015 Yes E11.40 Type 2 diabetes mellitus with diabetic neuropathy, 06/11/2015 Yes unspecified I70.245 Atherosclerosis of native arteries of left leg with ulceration 06/11/2015 Yes of other part of foot I25.10 Atherosclerotic heart disease of native coronary artery 06/11/2015 Yes without angina pectoris Inactive Problems Resolved Problems Electronic Signature(s) Signed: 08/20/2015 3:52:06 PM By: Madelaine BhatBurns, III, Katye Valek MD Entered By: Madelaine BhatBurns, III, Braelynne Garinger on 08/20/2015 14:12:53 Woodstock, Lois K. ([id number]) -------------------------------------------------------------------------------- Progress Note Details Patient Name: Dominique MarkerPOTEAT, Mackinley K. Date of Service: 08/20/2015 1:30 PM Medical Record Patient Account Number: [id number] Number: Treating RN: Clover MealyAfful, RN, BSN, Rita 11/27/1950 [phone](64 y.o. 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 History of Present Illness (HPI) Very pleasant 65 year old with past medical history significant for type 2 diabetes (hemoglobin A1c 7.3 in June 2016), peripheral neuropathy, peripheral vascular disease, and coronary artery disease. 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 She developed an ulceration in between her left third and fourth metatarsal heads in April 2014 after stepping on a piece of glass. She underwent LLE angioplasty in June 2014 by Dr. Gilda CreaseSchnier. She was  treated at [facility] wound clinic with hyperbaric oxygen therapy for a deep tissue infection, which grew MRSA and did not significantly improve with antibiotics. X-rays showed no evidence for underlying osteomyelitis. She also underwent placement of Dermagraft x7. She completed a total of 60 HBO treatments at [facility] in Feb 2015. In-chamber TCOM >74200mmHg. Her ulcer worsened after stopping HBO and she completed an additional 30 treatments (90 total) with significant improvement (eventually healed). She underwent angioplasty of her left peroneal artery, tibioperoneal trunk, and superficial femoral artery on September 26, 2013. MRI on 10/12/13 showed no evidence for osteomyelitis or abscess. s/p Grafix x 5 and Epifix x 3 application with significant improvement. Significantly improved after starting to offload with knee walker, which she used at home but not in public for fear of falling. Also using darco shoe. Refused total contact cast secondary to fall history. Her ulcer was healed at her last clinic visit in Feb 2016. She returned to clinic after stepping on a piece of plastic in October 2016. She developed a painful callus and was seen by Dr. Alberteen Spindleline in podiatry. The callus was debrided, and she was found to have an underlying ulceration. X-ray reportedly showed no evidence for underlying osteomyelitis. Records requested. Repeat x-ray 07/16/2015 showed  no evidence for osteomyelitis. She reports moderate pain with pressure. No claudication or rest pain. ABI 0.8. She has been applying silver alginate and offloading with a knee walker when possible and a postop shoe and new orthotics. Epifix initially applied 07/30/2015. No fever or chills. Minimal bloody drainage. ABELINA, KETRON K. ([id number]) Objective Constitutional Pulse regular. Respirations normal and unlabored. Afebrile. Vitals Time Taken: 1:31 PM, Height: 68 in, Weight: 154 lbs, BMI: 23.4, Temperature: 98.2 F, Pulse: 71 bpm,  Respiratory Rate: 18 breaths/min, Blood Pressure: 186/69 mmHg. General Notes: Left fourth metatarsal head plantar ulceration improved with Epifix. Moderate surrounding callus. sharply debrided. Full-thickness. No exposed deep structures. No probe to bone. No cellulitis. No palpable pedal pulses per her baseline. Dopplerable DP and PT. Left ABI 0.8. Integumentary (Hair, Skin) Wound #2 status is Open. Original cause of wound was Trauma. The wound is located on the Left,Plantar Foot. The wound measures 0.4cm length x 0.3cm width x 0.2cm depth; 0.094cm^2 area and 0.019cm^3 volume. The wound is limited to skin breakdown. There is no tunneling or undermining noted. There is a small amount of serous drainage noted. The wound margin is flat and intact. There is large (67-100%) pink granulation within the wound bed. There is no necrotic tissue within the wound bed. The periwound skin appearance exhibited: Callus, Moist. The periwound skin appearance did not exhibit: Crepitus, Excoriation, Fluctuance, Friable, Induration, Localized Edema, Rash, Scarring, Dry/Scaly, Maceration, Atrophie Blanche, Cyanosis, Ecchymosis, Hemosiderin Staining, Mottled, Pallor, Rubor, Erythema. Periwound temperature was noted as No Abnormality. The periwound has tenderness on palpation. 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 Procedures KIANA, HOLLAR K. ([id number]) Wound #2 Wound #2 is a Diabetic Wound/Ulcer of the Lower Extremity located on the Left,Plantar Foot . There was a Skin/Subcutaneous Tissue Debridement ([id number]) debridement with  total area of 0.16 sq cm performed by BURNS III, Melanie Crazier., MD. with the following instrument(s): Blade and Curette to remove Viable and Non-Viable tissue/material including Fat, Fibrin/Slough, Callus, and Subcutaneous after achieving pain control using Lidocaine 4% Topical Solution. A time out was conducted prior to the start of the procedure. A Minimum amount of bleeding was controlled with Pressure. The procedure was tolerated well with a pain level of 0 throughout and a pain level of 0 following the procedure. Post Debridement Measurements: 0.4cm length x 0.4cm width x 0.3cm depth; 0.038cm^3 volume. Post procedure Diagnosis Wound #2: Same as Pre-Procedure Wound #2 is a Diabetic Wound/Ulcer of the Lower Extremity located on the Left,Plantar Foot. A skin graft procedure using a bioengineered skin substitute/cellular or tissue based product was performed by BURNS III, Melanie Crazier., MD. Epifix was applied and secured with Steri-Strips. 2 sq cm of product was utilized and 0 sq cm was wasted. Post Application, mepitel 1 was applied. A Time Out was conducted prior to the start of the procedure. The procedure was tolerated well with a pain level of 0 throughout and a pain level of 0 following the procedure. Post procedure Diagnosis Wound #2: Same as Pre-Procedure . Plan Wound Cleansing: Wound #2 Left,Plantar Foot: Other: - Do not get wet or remove dressing. Anesthetic: Wound #2 Left,Plantar Foot: Topical Lidocaine 4% cream applied to wound bed prior to  debridement Dressing Change Frequency: Wound #2 Left,Plantar Foot: Change dressing every week Follow-up Appointments: Wound #2 Left,Plantar Foot: Return Appointment in 1 week. Off-Loading: Wound #2 Left,Plantar Foot: Open toe surgical shoe with peg assist. Advanced Therapies: Wound #2 Left,Plantar Foot: EpiFix application in clinic; including contact layer, fixation with steri strips, dry gauze and cover dressing. - layer of foam for  additional protection Phifer, Fatma K. ([id number]) Epifix applied today. Continue offloading measures. Electronic Signature(s) Signed: 08/20/2015 3:52:06 PM By: Madelaine Bhat MD Entered By: Madelaine Bhat on 08/20/2015 14:15:41 Breault, Amamda KMarland Kitchen ([id number]) -------------------------------------------------------------------------------- SuperBill Details Patient Name: [name]. Date of Service: 08/20/2015 Medical Record Patient Account Number: [id number] Number: Treating RN: Clover Mealy, RN, BSN, Rita June 21, 1951 [phone] y.o. Other Clinician: Date of Birth/Sex: Female) Treating BURNS III, Primary Care Physician: Aram Beecham Physician/Extender: Zollie Beckers Referring Physician: Bing Plume in Treatment: 10 Diagnosis Coding ICD-10 Codes Code Description E11.621 Type 2 diabetes mellitus with foot ulcer E11.40 Type 2 diabetes mellitus with diabetic neuropathy, unspecified I70.245 Atherosclerosis of native arteries of left leg with ulceration of other part of foot I25.10 Atherosclerotic heart disease of native coronary artery without angina pectoris Facility Procedures CPT4 Code: 29562130 Description: (Facility Use Only) Q4131 o Epifix o Per 1 SQ CM Modifier: Quantity: 2 CPT4 Code: 86578469 Description: 15275 - SKIN SUB GRAFT FACE/NK/HF/G ICD-10 Description Diagnosis E11.621 Type 2 diabetes mellitus with foot ulcer Modifier: Quantity: 1 Physician Procedures CPT4 Code: 6295284 Description: 15275 - WC PHYS SKIN SUB GRAFT FACE/NK/HF/G ICD-10 Description Diagnosis E11.621 Type 2 diabetes mellitus with foot ulcer Modifier: Quantity: 1 Electronic Signature(s) Signed: 08/20/2015 3:52:06 PM By: Madelaine Bhat MD Entered By: Madelaine Bhat on 08/20/2015 14:15:54

## 2015-08-22 DIAGNOSIS — E11621 Type 2 diabetes mellitus with foot ulcer: Secondary | ICD-10-CM | POA: Diagnosis not present

## 2015-08-22 NOTE — Progress Notes (Signed)
[name], [name] ([id number]) Visit Report for 08/22/2015 Arrival Information Details Patient Name: [name], [name] Date of Service: 08/22/2015 1:00 PM Medical Record Patient Account Number: [id number] Number: Afful, RN, BSN, Treating RN: 11-13-1950 (65 y.o. [place] Date of Birth/Sex: Female) Other Clinician: Primary Care Physician: Aram BeechamSparks, Dominique Treating Evlyn KannerBritto, Dominique Referring Physician: Aram BeechamSparks, Dominique Physician/Extender: Dominique AdeWeeks in Treatment: 10 Visit Information History Since Last Visit Any new allergies or adverse reactions: No Patient Arrived: Ambulatory Had a fall or experienced change in No Arrival Time: 12:55 activities of daily living that may affect Accompanied By: self risk of falls: Transfer Assistance: None Signs or symptoms of abuse/neglect since last No Patient Identification Verified: Yes visito Secondary Verification Process Yes Hospitalized since last visit: No Completed: Has Dressing in Place as Prescribed: Yes Patient Requires Transmission- No Pain Present Now: No Based Precautions: Patient Has Alerts: Yes Patient Alerts: Patient on Blood Thinner DMII warfarin Electronic Signature(s) Signed: 08/22/2015 1:30:25 PM By: Elpidio EricAfful, Rita BSN, RN Entered By: Elpidio EricAfful, Rita on 08/22/2015 13:30:25 [name], [name] ([id number]) -------------------------------------------------------------------------------- Clinic Level of Care Assessment Details Patient Name: [name], [name] Date of Service: 08/22/2015 1:00 PM Medical Record Patient Account Number: [id number] Number: Afful, RN, BSN, Treating RN: 11-13-1950 (65 y.o.  [place] Date of Birth/Sex: Female) Other Clinician: Primary Care Physician: Aram BeechamSparks, Dominique Treating Evlyn KannerBritto, Dominique Referring Physician: Aram BeechamSparks, Dominique Physician/Extender: Dominique AdeWeeks in Treatment: 10 Clinic Level of Care Assessment Items TOOL 4 Quantity Score []  - Use when only an EandM is performed on FOLLOW-UP visit  0 ASSESSMENTS - Nursing Assessment / Reassessment X - Reassessment of Co-morbidities (includes updates in patient status) 1 10 X - Reassessment of Adherence to Treatment Plan 1 5 ASSESSMENTS - Wound and Skin Assessment / Reassessment X - Simple Wound Assessment / Reassessment - one wound 1 5 []  - Complex Wound Assessment / Reassessment - multiple wounds 0 []  - Dermatologic / Skin Assessment (not related to wound area) 0 ASSESSMENTS - Focused Assessment []  - Circumferential Edema Measurements - multi extremities 0 []  - Nutritional Assessment / Counseling / Intervention 0 []  - Lower Extremity Assessment (monofilament, tuning fork, pulses) 0 []  - Peripheral Arterial Disease Assessment (using hand held doppler) 0 ASSESSMENTS - Ostomy and/or Continence Assessment and Care []  - Incontinence Assessment and Management 0 []  - Ostomy Care Assessment and Management (repouching, etc.) 0 PROCESS - Coordination of Care X - Simple Patient / Family Education for ongoing care 1 15 []  - Complex (extensive) Patient / Family Education for ongoing care 0 []  - Staff obtains ChiropractorConsents, Records, Test Results / Process Orders 0 []  - Staff telephones HHA, Nursing Homes / Clarify orders / etc 0 [name], [name] ([id number]) []  - Routine Transfer to another Facility (non-emergent condition) 0 []  - Routine Hospital Admission (non-emergent condition) 0 []  - New Admissions / Manufacturing engineernsurance Authorizations / Ordering NPWT, Apligraf, etc. 0 []  - Emergency Hospital Admission (emergent condition) 0 []  - Simple Discharge Coordination 0 []  - Complex (extensive) Discharge Coordination 0 PROCESS - Special Needs []  - Pediatric / Minor Patient Management 0 []  - Isolation Patient Management 0 []  - Hearing / Language / Visual special needs 0 []  - Assessment of Community assistance (transportation, D/C planning, etc.) 0 []  - Additional assistance / Altered mentation 0 []  - Support Surface(s) Assessment (bed, cushion, seat, etc.)  0 INTERVENTIONS - Wound Cleansing / Measurement []  - Simple Wound Cleansing - one wound 0 []  - Complex Wound Cleansing - multiple wounds 0 []  - Wound Imaging (photographs - any number  of wounds) 0 []  - Wound Tracing (instead of photographs) 0 []  - Simple Wound Measurement - one wound 0 []  - Complex Wound Measurement - multiple wounds 0 INTERVENTIONS - Wound Dressings X - Small Wound Dressing one or multiple wounds 1 10 []  - Medium Wound Dressing one or multiple wounds 0 []  - Large Wound Dressing one or multiple wounds 0 []  - Application of Medications - topical 0 []  - Application of Medications - injection 0 [name], [name] ([id number]) INTERVENTIONS - Miscellaneous []  - External ear exam 0 []  - Specimen Collection (cultures, biopsies, blood, body fluids, etc.) 0 []  - Specimen(s) / Culture(s) sent or taken to Lab for analysis 0 []  - Patient Transfer (multiple staff / Michiel Sites Lift / Similar devices) 0 []  - Simple Staple / Suture removal (25 or less) 0 []  - Complex Staple / Suture removal (26 or more) 0 []  - Hypo / Hyperglycemic Management (close monitor of Blood Glucose) 0 []  - Ankle / Brachial Index (ABI) - do not check if billed separately 0 []  - Vital Signs 0 Has the patient been seen at the hospital within the last three years: Yes Total Score: 45 Level Of Care: New/Established - Level 2 Electronic Signature(s) Signed: 08/22/2015 1:31:51 PM By: Elpidio Eric BSN, RN Entered By: Elpidio Eric on 08/22/2015 13:31:51 [name], [name] ([id number]) -------------------------------------------------------------------------------- Encounter Discharge Information Details Patient Name: Dominique Marker K. Date of Service: 08/22/2015 1:00 PM Medical Record Patient Account Number: [id number] Number: Afful, RN, BSN, Treating RN: 1951/02/17 (64 y.o. [place] S[place] Date of Birth/Sex: Female) Other Clinician: Primary Care Physician: Aram Beecham Treating Evlyn Kanner Referring Physician:  Aram Beecham Physician/Extender: Weeks in Treatment: 10 Encounter Discharge Information Items Discharge Pain Level: 0 Discharge Condition: Stable Ambulatory Status: Ambulatory Discharge Destination: Home Private Transportation: Auto Accompanied By: self Schedule Follow-up Appointment: No Medication Reconciliation completed and No provided to Patient/Care Zeda Gangwer: Clinical Summary of Care: Electronic Signature(s) Signed: 08/22/2015 1:31:20 PM By: Elpidio Eric BSN, RN Entered By: Elpidio Eric on 08/22/2015 13:31:20 [name] ([id number]) -------------------------------------------------------------------------------- Patient/Caregiver Education Details Patient Name: [name]. Date of Service: 08/22/2015 1:00 PM Medical Record Patient Account Number: [id number] Number: Afful, RN, BSN, Treating RN: [birth year] (65 y.o. [place] Sink Date of Birth/Gender: Female) Other Clinician: Primary Care Physician: Aram Beecham Treating Evlyn Kanner Referring Physician: Aram Beecham Physician/Extender: [name] in Treatment: 10 Education Assessment Education Provided To: Patient Education Topics Provided Basic Hygiene: Methods: Explain/Verbal Responses: State content correctly Electronic Signature(s) Signed: 08/22/2015 1:31:06 PM By: Elpidio Eric BSN, RN Entered By: Elpidio Eric on 08/22/2015 13:31:06

## 2015-08-27 ENCOUNTER — Encounter: Payer: Medicare Other | Admitting: Surgery

## 2015-08-27 DIAGNOSIS — E11621 Type 2 diabetes mellitus with foot ulcer: Secondary | ICD-10-CM | POA: Diagnosis not present

## 2015-08-28 NOTE — Progress Notes (Signed)
[name] ([id number]) Visit Report for 08/27/2015 Arrival Information Details Patient Name: [name], [name]. Date of Service: 08/27/2015 2:15 PM Medical Record Number: [id number] Patient Account Number: [id number] Date of Birth/Sex: 28-Mar-1951 (64 y.o. Female) Treating RN: Afful, RN, BSN, [place] Sink Primary Care Physician: Aram Beecham Other Clinician: Referring Physician: Aram Beecham Treating Physician/Extender: BURNS III, Regis Bill in Treatment: 11 Visit Information History Since Last Visit Added or deleted any medications: No Patient Arrived: Ambulatory Any new allergies or adverse reactions: No Arrival Time: 14:13 Had a fall or experienced change in No Accompanied By: self activities of daily living that may affect Transfer Assistance: None risk of falls: Patient Identification Verified: Yes Signs or symptoms of abuse/neglect since last No Secondary Verification Process Yes visito Completed: Hospitalized since last visit: No Patient Requires Transmission- No Has Dressing in Place as Prescribed: Yes Based Precautions: Pain Present Now: No Patient Has Alerts: Yes Patient Alerts: Patient on Blood Thinner DMII warfarin Electronic Signature(s) Signed: 08/27/2015 4:59:08 PM By: Elpidio Eric BSN, RN Entered By: Elpidio Eric on 08/27/2015 14:13:35 Parada, Suki Kirtland Bouchard ([id number]) -------------------------------------------------------------------------------- Encounter Discharge Information Details Patient Name: Dominique Marker K. Date of Service: 08/27/2015 2:15 PM Medical Record Number: [id number] Patient Account Number: [id number] Date of Birth/Sex: 08-Jul-1951 (64 y.o. Female) Treating RN: Clover Mealy, RN, BSN, [place] Sink Primary Care Physician: Aram Beecham Other Clinician: Referring Physician: Aram Beecham Treating Physician/Extender: BURNS III, Regis Bill in Treatment: 11 Encounter Discharge Information Items Discharge Pain Level: 0 Discharge Condition:  Stable Ambulatory Status: Ambulatory Discharge Destination: Home Transportation: Private Auto Accompanied By: self Schedule Follow-up Appointment: No Medication Reconciliation completed and provided to Patient/Care No Bret Stamour: Provided on Clinical Summary of Care: 08/27/2015 Form Type Recipient Paper Patient PP Electronic Signature(s) Signed: 08/27/2015 2:50:13 PM By: Gwenlyn Perking Entered By: Gwenlyn Perking on 08/27/2015 14:50:12 Demmer, Lauryl K. ([id number]) -------------------------------------------------------------------------------- Lower Extremity Assessment Details Patient Name: Dominique Marker K. Date of Service: 08/27/2015 2:15 PM Medical Record Number: [id number] Patient Account Number: [id number] Date of Birth/Sex: 1951-04-20 (64 y.o. Female) Treating RN: Afful, RN, BSN, [place] Sink Primary Care Physician: Aram Beecham Other Clinician: Referring Physician: Aram Beecham Treating Physician/Extender: BURNS III, Regis Bill in Treatment: 11 Vascular Assessment Pulses: Posterior Tibial Dorsalis Pedis Palpable: [Left:Yes] Extremity colors, hair growth, and conditions: Extremity Color: [Left:Normal] Hair Growth on Extremity: [Left:No] Temperature of Extremity: [Left:Warm] Capillary Refill: [Left:< 3 seconds] Toe Nail Assessment Left: Right: Thick: No Discolored: No Deformed: No Improper Length and Hygiene: No Electronic Signature(s) Signed: 08/27/2015 4:59:08 PM By: Elpidio Eric BSN, RN Entered By: Elpidio Eric on 08/27/2015 14:14:08 Deshazer, Cindy K. ([id number]) -------------------------------------------------------------------------------- Multi Wound Chart Details Patient Name: Dominique Marker K. Date of Service: 08/27/2015 2:15 PM Medical Record Number: [id number] Patient Account Number: [id number] Date of Birth/Sex: Mar 31, 1951 (64 y.o. Female) Treating RN: Clover Mealy, RN, BSN, [place] Sink Primary Care Physician: Aram Beecham Other Clinician: Referring Physician:  Aram Beecham Treating Physician/Extender: BURNS III, Regis Bill in Treatment: 11 Vital Signs Height(in): 68 Pulse(bpm): 67 Weight(lbs): 154 Blood Pressure 153/62 (mmHg): Body Mass Index(BMI): 23 Temperature(F): 97.8 Respiratory Rate 18 (breaths/min): Photos: [2:No Photos] [N/A:N/A] Wound Location: [2:Left Foot - Plantar] [N/A:N/A] Wounding Event: [2:Trauma] [N/A:N/A] Primary Etiology: [2:Diabetic Wound/Ulcer of the Lower Extremity] [N/A:N/A] Comorbid History: [2:Cataracts, Angina, Arrhythmia, Coronary Artery Disease, Hypertension, Peripheral Arterial Disease, Type II Diabetes] [N/A:N/A] Date Acquired: [2:05/21/2015] [N/A:N/A] Weeks of Treatment: [2:11] [N/A:N/A] Wound Status: [2:Open] [N/A:N/A] Measurements L x W x D 0.2x0.2x0.2 [N/A:N/A] (cm) Area (cm) : [2:0.031] [N/A:N/A] Volume (cm) : [2:0.006] [N/A:N/A] % Reduction in Area: [2:50.80%] [N/A:N/A] % Reduction  in Volume: 76.00% [N/A:N/A] Classification: [2:Grade 1] [N/A:N/A] Exudate Amount: [2:Small] [N/A:N/A] Exudate Type: [2:Serous] [N/A:N/A] Exudate Color: [2:amber] [N/A:N/A] Wound Margin: [2:Flat and Intact] [N/A:N/A] Granulation Amount: [2:Large (67-100%)] [N/A:N/A] Granulation Quality: [2:Pink] [N/A:N/A] Necrotic Amount: [2:None Present (0%)] [N/A:N/A] Exposed Structures: [2:Fascia: No Fat: No] [N/A:N/A] Tendon: No Muscle: No Joint: No Bone: No Limited to Skin Breakdown Epithelialization: Small (1-33%) N/A N/A Periwound Skin Texture: Callus: Yes N/A N/A Edema: No Excoriation: No Induration: No Crepitus: No Fluctuance: No Friable: No Rash: No Scarring: No Periwound Skin Moist: Yes N/A N/A Moisture: Maceration: No Dry/Scaly: No Periwound Skin Color: Atrophie Blanche: No N/A N/A Cyanosis: No Ecchymosis: No Erythema: No Hemosiderin Staining: No Mottled: No Pallor: No Rubor: No Temperature: No Abnormality N/A N/A Tenderness on Yes N/A N/A Palpation: Wound Preparation: Ulcer Cleansing:  N/A N/A Rinsed/Irrigated with Saline Topical Anesthetic Applied: Other: lidocaine 4% Treatment Notes Electronic Signature(s) Signed: 08/27/2015 4:59:08 PM By: Elpidio Eric BSN, RN Entered By: Elpidio Eric on 08/27/2015 14:38:14 [name], [name] ([id number]) -------------------------------------------------------------------------------- Multi-Disciplinary Care Plan Details Patient Name: CANARY, FISTER. Date of Service: 08/27/2015 2:15 PM Medical Record Number: [id number] Patient Account Number: [id number] Date of Birth/Sex: 1951/05/05 (64 y.o. Female) Treating RN: Clover Mealy, RN, BSN, [place] Sink Primary Care Physician: Aram Beecham Other Clinician: Referring Physician: Aram Beecham Treating Physician/Extender: BURNS III, Regis Bill in Treatment: 11 Active Inactive Wound/Skin Impairment Nursing Diagnoses: Impaired tissue integrity Goals: Patient/caregiver will verbalize understanding of skin care regimen Date Initiated: 06/11/2015 Goal Status: Active Ulcer/skin breakdown will have a volume reduction of 30% by week 4 Date Initiated: 06/11/2015 Goal Status: Active Ulcer/skin breakdown will have a volume reduction of 50% by week 8 Date Initiated: 06/11/2015 Goal Status: Active Ulcer/skin breakdown will have a volume reduction of 80% by week 12 Date Initiated: 06/11/2015 Goal Status: Active Ulcer/skin breakdown will heal within 14 weeks Date Initiated: 06/11/2015 Goal Status: Active Interventions: Assess patient/caregiver ability to obtain necessary supplies Assess ulceration(s) every visit Notes: Electronic Signature(s) Signed: 08/27/2015 4:59:08 PM By: Elpidio Eric BSN, RN Entered By: Elpidio Eric on 08/27/2015 14:38:04 Boudoin, Zaara K. ([id number]) -------------------------------------------------------------------------------- Pain Assessment Details Patient Name: Dominique Marker K. Date of Service: 08/27/2015 2:15 PM Medical Record Number: [id number] Patient Account Number:  [id number] Date of Birth/Sex: November 06, 1950 (64 y.o. Female) Treating RN: Clover Mealy, RN, BSN, [place] Sink Primary Care Physician: Aram Beecham Other Clinician: Referring Physician: Aram Beecham Treating Physician/Extender: BURNS III, Regis Bill in Treatment: 11 Active Problems Location of Pain Severity and Description of Pain Patient Has Paino No Site Locations Pain Management and Medication Current Pain Management: Electronic Signature(s) Signed: 08/27/2015 4:59:08 PM By: Elpidio Eric BSN, RN Entered By: Elpidio Eric on 08/27/2015 14:13:42 Bula, [name] ([id number]) -------------------------------------------------------------------------------- Patient/Caregiver Education Details Patient Name: [name]. Date of Service: 08/27/2015 2:15 PM Medical Record Number: [id number] Patient Account Number: [id number] Date of Birth/Gender: Jun 23, 1951 (65 y.o. Female) Treating RN: Clover Mealy, RN, BSN, [place] Sink Primary Care Physician: Aram Beecham Other Clinician: Referring Physician: Aram Beecham Treating Physician/Extender: BURNS III, Regis Bill in Treatment: 11 Education Assessment Education Provided To: Patient Education Topics Provided Wound/Skin Impairment: Methods: Explain/Verbal Responses: State content correctly Electronic Signature(s) Signed: 08/27/2015 4:59:08 PM By: Elpidio Eric BSN, RN Entered By: Elpidio Eric on 08/27/2015 14:33:31 Olazabal, Fairy K. ([id number]) -------------------------------------------------------------------------------- Wound Assessment Details Patient Name: Dominique Marker K. Date of Service: 08/27/2015 2:15 PM Medical Record Number: [id number] Patient Account Number: [id number] Date of Birth/Sex: 11/16/1950 (64 y.o. Female) Treating RN: Afful, RN, BSN, [place] Sink Primary Care Physician: Aram Beecham Other Clinician: Referring Physician: Aram Beecham  Treating Physician/Extender: BURNS III, WALTER Weeks in Treatment: 11 Wound Status Wound Number: 2  Primary Diabetic Wound/Ulcer of the Lower Etiology: Extremity Wound Location: Left Foot - Plantar Wound Open Wounding Event: Trauma Status: Date Acquired: 05/21/2015 Comorbid Cataracts, Angina, Arrhythmia, Weeks Of Treatment: 11 History: Coronary Artery Disease, Hypertension, Clustered Wound: No Peripheral Arterial Disease, Type II Diabetes Photos Photo Uploaded By: Elpidio Eric on 08/27/2015 16:46:53 Wound Measurements Length: (cm) 0.2 Width: (cm) 0.2 Depth: (cm) 0.2 Area: (cm) 0.031 Volume: (cm) 0.006 % Reduction in Area: 50.8% % Reduction in Volume: 76% Epithelialization: Small (1-33%) Tunneling: No Undermining: No Wound Description Classification: Grade 1 Wound Margin: Flat and Intact Exudate Amount: Small Exudate Type: Serous Exudate Color: amber Foul Odor After Cleansing: No Wound Bed Granulation Amount: Large (67-100%) Exposed Structure Granulation Quality: Pink Fascia Exposed: No Necrotic Amount: None Present (0%) Fat Layer Exposed: No Range, Finnley K. ([id number]) Tendon Exposed: No Muscle Exposed: No Joint Exposed: No Bone Exposed: No Limited to Skin Breakdown Periwound Skin Texture Texture Color No Abnormalities Noted: No No Abnormalities Noted: No Callus: Yes Atrophie Blanche: No Crepitus: No Cyanosis: No Excoriation: No Ecchymosis: No Fluctuance: No Erythema: No Friable: No Hemosiderin Staining: No Induration: No Mottled: No Localized Edema: No Pallor: No Rash: No Rubor: No Scarring: No Temperature / Pain Moisture Temperature: No Abnormality No Abnormalities Noted: No Tenderness on Palpation: Yes Dry / Scaly: No Maceration: No Moist: Yes Wound Preparation Ulcer Cleansing: Rinsed/Irrigated with Saline Topical Anesthetic Applied: Other: lidocaine 4%, Treatment Notes Wound #2 (Left, Plantar Foot) 1. Cleansed with: Clean wound with Normal Saline 4. Dressing Applied: Iodosorb Ointment 5. Secondary Dressing Applied Gauze and  Kerlix/Conform 7. Secured with Tape Notes darco shoe with peg assist Electronic Signature(s) Signed: 08/27/2015 4:59:08 PM By: Elpidio Eric BSN, RN Entered By: Elpidio Eric on 08/27/2015 14:21:31 Cavanaugh, [name] ([id number]) -------------------------------------------------------------------------------- Vitals Details Patient Name: Dominique Marker K. Date of Service: 08/27/2015 2:15 PM Medical Record Number: [id number] Patient Account Number: [id number] Date of Birth/Sex: 07-06-1951 (64 y.o. Female) Treating RN: Afful, RN, BSN, Rita Primary Care Physician: Aram Beecham Other Clinician: Referring Physician: Aram Beecham Treating Physician/Extender: BURNS III, Regis Bill in Treatment: 11 Vital Signs Time Taken: 14:14 Temperature (F): 97.8 Height (in): 68 Pulse (bpm): 67 Weight (lbs): 154 Respiratory Rate (breaths/min): 18 Body Mass Index (BMI): 23.4 Blood Pressure (mmHg): 153/62 Reference Range: 80 - 120 mg / dl Electronic Signature(s) Signed: 08/27/2015 4:59:08 PM By: Elpidio Eric BSN, RN Entered By: Elpidio Eric on 08/27/2015 14:18:45

## 2015-08-28 NOTE — Progress Notes (Signed)
[name] ([id number]) Visit Report for 08/27/2015 Chief Complaint Document Details Patient Name: [name], [name]. Date of Service: 08/27/2015 2:15 PM Medical Record Patient Account Number: [id number] Number: Treating RN: Dominique Mealy, RN, BSN, [name] [birth year] [phone] y.o. 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 Electronic Signature(s) Signed: 08/27/2015 4:12:28 PM By: Dominique Bhat MD Entered By: [name] on 08/27/2015 14:53:06 [name], [name] ([id number]) -------------------------------------------------------------------------------- HPI Details Patient Name: [name]. Date of Service: 08/27/2015 2:15 PM Medical Record Patient Account Number: [id number] Number: Treating RN: Dominique Mealy, RN, BSN, [name] [birth year] [phone] y.o. Other Clinician: Date of Birth/Sex: Female) Treating [name], Primary Care Physician: [name] Physician/Extender: [name] Referring Physician: Bing [name] in Treatment: 11 History of Present Illness HPI Description: Very pleasant 65 year old with past medical history significant for type 2 diabetes (hemoglobin A1c 7.3 in June 2016), peripheral neuropathy, peripheral vascular disease, and coronary artery disease. 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 She developed an ulceration in between her left third and fourth metatarsal heads in April 2014 after stepping on a piece of glass. She underwent LLE angioplasty in June 2014 by Dr. Gilda [name]. She was treated at the [facility] wound clinic with hyperbaric oxygen therapy for a deep tissue infection, which grew MRSA and did not  significantly improve with antibiotics. X-rays showed no evidence for underlying osteomyelitis. She also underwent placement of Dermagraft x7. She completed a total of 60 HBO treatments at [facility] in Feb 2015. In-chamber TCOM >739mmHg. Her ulcer worsened after stopping HBO and she completed an additional 30 treatments (90 total) with significant improvement (eventually healed). She underwent angioplasty of her left peroneal artery, tibioperoneal trunk, and superficial femoral artery on September 26, 2013. MRI on 10/12/13 showed no evidence for osteomyelitis or abscess. s/p Grafix x 5 and Epifix x 3 application with significant improvement. Significantly improved after starting to offload with knee walker, which she used at home but not in public for fear of falling. Also using darco shoe. Refused total contact cast secondary to fall history. Her ulcer was healed at her last clinic visit in Feb 2016. She returned to clinic after stepping on a piece of plastic in October 2016. She developed a painful callus and was seen by Dr. Alberteen [name] in podiatry. The callus was debrided, and she was found to have an underlying ulceration. X-ray reportedly showed no evidence for underlying osteomyelitis. Repeat x-ray 07/16/2015 showed no evidence for osteomyelitis. She reports moderate pain with pressure. No claudication or rest pain. ABI 0.8. She has been applying silver alginate and offloading with a knee walker when possible and a postop shoe and new orthotics. Epifix initially applied 07/30/2015 with significant improvement. She returns to clinic for follow-up and is without new complaints. Moderate pain with pressure per above. No fever or chills. Minimal bloody drainage. Electronic Signature(s) Signed: 08/27/2015 4:12:28 PM By: Dominique Bhat MD Entered By: [name] on 08/27/2015 14:54:33 [name], [name]  ([id number]) -------------------------------------------------------------------------------- Callus Pairing Details Patient Name: [name]. Date of Service: 08/27/2015 2:15 PM Medical Record Patient Account Number: [id number] Number: Treating RN: Dominique Mealy, RN, BSN, [name] April 04, 1951 [phone] y.o. Other Clinician: Date of Birth/Sex: Female) Treating [name], Primary Care Physician: [name] Physician/Extender: [name]  Referring Physician: Bing [name] in Treatment: 11 Procedure Performed for: Wound #2 Left,Plantar Foot Performed By: Physician [name], [name]., MD Post Procedure Diagnosis Same as Pre-procedure Electronic Signature(s) Signed: 08/27/2015 4:12:28 PM By: Dominique Bhat MD Entered By: [name] on 08/27/2015 14:52:55 [name], [name] ([id number]) -------------------------------------------------------------------------------- Physical Exam Details Patient Name: [name], [name] Date of Service: 08/27/2015 2:15 PM Medical Record Patient Account Number: [id number] Number: Treating RN: Dominique Mealy, RN, BSN, [name] 08-Aug-1951 [phone] y.o. Other Clinician: Date of Birth/Sex: Female) Treating [name], Primary Care Physician: [name] Physician/Extender: [name] Referring Physician: Bing [name] in Treatment: 11 Constitutional . Pulse regular. Respirations normal and unlabored. Afebrile. . Notes Left fourth metatarsal head plantar ulceration much improved with Epifix. Moderate surrounding callus. Sharply debrided. Ulcer is almost healed. No cellulitis. No palpable pedal pulses per her baseline. Dopplerable DP and PT. Left ABI 0.8. Electronic Signature(s) Signed: 08/27/2015 4:12:28 PM By: Dominique Bhat MD Entered By: [name] on 08/27/2015 14:55:34 [name], [name] ([id number]) -------------------------------------------------------------------------------- Physician Orders Details Patient Name:  [name]. Date of Service: 08/27/2015 2:15 PM Medical Record Patient Account Number: [id number] Number: Treating RN: Dominique Mealy, RN, BSN, [name] Feb 09, 1951 [phone] y.o. Other Clinician: Date of Birth/Sex: Female) Treating [name], Primary Care Physician: [name] Physician/Extender: [name] Referring Physician: Bing [name] in Treatment: 11 Verbal / Phone Orders: Yes Clinician: Afful, RN, BSN, [name] Read Back and Verified: Yes Diagnosis Coding Wound Cleansing Wound #2 Left,Plantar Foot o Cleanse wound with mild soap and water o May Shower, gently pat wound dry prior to applying new dressing. Anesthetic Wound #2 Left,Plantar Foot o Topical Lidocaine 4% cream applied to wound bed prior to debridement Primary Wound Dressing Wound #2 Left,Plantar Foot o Iodosorb Ointment Secondary Dressing Wound #2 Left,Plantar Foot o Gauze and Kerlix/Conform Dressing Change Frequency Wound #2 Left,Plantar Foot o Change dressing every day. Follow-up Appointments Wound #2 Left,Plantar Foot o Return Appointment in 1 week. Off-Loading Wound #2 Left,Plantar Foot o Open toe surgical shoe with peg assist. Electronic Signature(s) Signed: 08/27/2015 4:12:28 PM By: Dominique Bhat MD Signed: 08/27/2015 4:59:08 PM By: Elpidio Eric BSN, RN [name], [name] ([id number]) Entered By: Elpidio Eric on 08/27/2015 14:40:01 [name], [name] ([id number]) -------------------------------------------------------------------------------- Problem List Details Patient Name: JUANICE, WARBURTON K. Date of Service: 08/27/2015 2:15 PM Medical Record Patient Account Number: [id number] Number: Treating RN: Dominique Mealy, RN, BSN, [name] 09/16/1950 [phone] y.o. Other Clinician: Date of Birth/Sex: Female) Treating [name], Primary Care Physician: [name] Physician/Extender: [name] Referring Physician: Bing [name] in Treatment: 11 Active  Problems ICD-10 Encounter Code Description Active Date Diagnosis E11.621 Type 2 diabetes mellitus with foot ulcer 06/11/2015 Yes E11.40 Type 2 diabetes mellitus with diabetic neuropathy, 06/11/2015 Yes unspecified I70.245 Atherosclerosis of native arteries of left leg with ulceration 06/11/2015 Yes of other part of foot I25.10 Atherosclerotic heart disease of native coronary artery 06/11/2015 Yes without angina pectoris Inactive Problems Resolved Problems Electronic Signature(s) Signed: 08/27/2015 4:12:28 PM By: Dominique Bhat MD Entered By: [name] on 08/27/2015 14:52:45 [name], [name] ([id number]) -------------------------------------------------------------------------------- Progress Note Details Patient Name: Dominique Marker K. Date of Service: 08/27/2015 2:15 PM Medical Record Patient Account Number: [id number] Number: Treating RN: Dominique Mealy, RN, BSN, [name] 06/25/1951 [phone] y.o. Other Clinician: Date of Birth/Sex: Female) Treating [name], Primary Care Physician: [name] Physician/Extender: [name] Referring Physician: Bing [name] in Treatment: 11 Subjective Chief Complaint Information obtained from Patient Recurrent left 4th metatarsal head diabetic foot  ulceration. History of Present Illness (HPI) Very pleasant 65 year old with past medical history significant for type 2 diabetes (hemoglobin A1c 7.3 in June 2016), peripheral neuropathy, peripheral vascular disease, and coronary artery disease. 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 She developed an ulceration in between her left third and fourth metatarsal heads in April 2014 after stepping on a piece of glass. She underwent LLE angioplasty in June 2014 by Dr. Gilda [name]. She was treated at the [facility] wound clinic with hyperbaric oxygen therapy for a deep tissue infection, which grew MRSA and did not  significantly improve with antibiotics. X-rays showed no evidence for underlying osteomyelitis. She also underwent placement of Dermagraft x7. She completed a total of 60 HBO treatments at [facility] in Feb 2015. In-chamber TCOM >783mmHg. Her ulcer worsened after stopping HBO and she completed an additional 30 treatments (90 total) with significant improvement (eventually healed). She underwent angioplasty of her left peroneal artery, tibioperoneal trunk, and superficial femoral artery on September 26, 2013. MRI on 10/12/13 showed no evidence for osteomyelitis or abscess. s/p Grafix x 5 and Epifix x 3 application with significant improvement. Significantly improved after starting to offload with knee walker, which she used at home but not in public for fear of falling. Also using darco shoe. Refused total contact cast secondary to fall history. Her ulcer was healed at her last clinic visit in Feb 2016. She returned to clinic after stepping on a piece of plastic in October 2016. She developed a painful callus and was seen by Dr. Alberteen [name] in podiatry. The callus was debrided, and she was found to have an underlying ulceration. X-ray reportedly showed no evidence for underlying osteomyelitis. Repeat x-ray 07/16/2015 showed no evidence for osteomyelitis. She reports moderate pain with pressure. No claudication or rest pain. ABI 0.8. She has been applying silver alginate and offloading with a knee walker when possible and a postop shoe and new orthotics. Epifix initially applied 07/30/2015 with significant improvement. She returns to clinic for follow-up and is without new complaints. Moderate pain with pressure per above. No fever or chills. Minimal bloody drainage. [name], [name] ([id number]) Objective Constitutional Pulse regular. Respirations normal and unlabored. Afebrile. Vitals Time Taken: 2:14 PM, Height: 68 in, Weight: 154 lbs, BMI: 23.4, Temperature: 97.8 F, Pulse: 67 bpm, Respiratory  Rate: 18 breaths/min, Blood Pressure: 153/62 mmHg. General Notes: Left fourth metatarsal head plantar ulceration much improved with Epifix. Moderate surrounding callus. Sharply debrided. Ulcer is almost healed. No cellulitis. No palpable pedal pulses per her baseline. Dopplerable DP and PT. Left ABI 0.8. Integumentary (Hair, Skin) Wound #2 status is Open. Original cause of wound was Trauma. The wound is located on the Left,Plantar Foot. The wound measures 0.2cm length x 0.2cm width x 0.2cm depth; 0.031cm^2 area and 0.006cm^3 volume. The wound is limited to skin breakdown. There is no tunneling or undermining noted. There is a small amount of serous drainage noted. The wound margin is flat and intact. There is large (67-100%) pink granulation within the wound bed. There is no necrotic tissue within the wound bed. The periwound skin appearance exhibited: Callus, Moist. The periwound skin appearance did not exhibit: Crepitus, Excoriation, Fluctuance, Friable, Induration, Localized Edema, Rash, Scarring, Dry/Scaly, Maceration, Atrophie Blanche, Cyanosis, Ecchymosis, Hemosiderin Staining, Mottled, Pallor, Rubor, Erythema. Periwound temperature was noted as No Abnormality. The periwound has tenderness on palpation. 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 Arterial insufficiency. [name], [name] ([id number]) Procedures Wound #2 Wound #2 is a Diabetic Wound/Ulcer of the Lower Extremity located on the Left,Plantar Foot . An Callus Pairing procedure was performed by [name], [name]., MD. Post procedure Diagnosis Wound #2: Same  as Pre-Procedure Plan Wound Cleansing: Wound #2 Left,Plantar Foot: Cleanse wound with mild soap and water May Shower, gently pat wound dry prior to applying new dressing. Anesthetic: Wound #2 Left,Plantar Foot: Topical Lidocaine 4% cream applied to wound bed prior to debridement Primary Wound Dressing: Wound #2 Left,Plantar Foot: Iodosorb Ointment Secondary Dressing: Wound #2 Left,Plantar Foot: Gauze and Kerlix/Conform Dressing Change Frequency: Wound #2 Left,Plantar Foot: Change dressing every day. Follow-up Appointments: Wound #2 Left,Plantar Foot: Return Appointment in 1 week. Off-Loading: Wound #2 Left,Plantar Foot: Open toe surgical shoe with peg assist. Cadexomer iodine dressing changes. Continue offloading measures per above. SUA, SPADAFORA ([id number]) Electronic Signature(s) Signed: 08/27/2015 4:12:28 PM By: Dominique Bhat MD Entered By: [name] on 08/27/2015 14:56:25 Swallow, Orissa KMarland [name] ([id number]) -------------------------------------------------------------------------------- SuperBill Details Patient Name: [name]. Date of Service: 08/27/2015 Medical Record Patient Account Number: [id number] Number: Treating RN: Dominique Mealy, RN, BSN, [name] 01-Feb-1951 [phone] y.o. Other Clinician: Date of Birth/Sex: Female) Treating [name], Primary Care Physician: [name] Physician/Extender: [name] Referring Physician: Bing [name] in Treatment: 11 Diagnosis Coding ICD-10 Codes Code Description E11.621 Type 2 diabetes mellitus with foot ulcer E11.40 Type 2 diabetes mellitus with diabetic neuropathy, unspecified I70.245 Atherosclerosis of native arteries of left leg with ulceration of other part of foot I25.10 Atherosclerotic heart disease of native coronary artery without angina pectoris Facility Procedures CPT4 Code: [id number] Description: 11055 - PARE BENIGN LES; SGL ICD-10 Description Diagnosis E11.621 Type 2 diabetes mellitus  with foot ulcer Modifier: Quantity: 1 Physician Procedures CPT4 Code: [id number] Description: 11055 - WC PHYS PARE BENIGN LES; SGL ICD-10 Description Diagnosis E11.621 Type 2 diabetes mellitus with foot ulcer Modifier: Quantity: 1 Electronic Signature(s) Signed: 08/27/2015 4:12:28 PM By: Dominique Bhat MD Entered By: [name] on 08/27/2015 14:56:41

## 2015-09-03 ENCOUNTER — Encounter: Payer: Medicare Other | Admitting: Surgery

## 2015-09-03 DIAGNOSIS — E11621 Type 2 diabetes mellitus with foot ulcer: Secondary | ICD-10-CM | POA: Diagnosis not present

## 2015-09-03 NOTE — Progress Notes (Addendum)
RAUL, WINTERHALTER ([id number]) Visit Report for 09/03/2015 Arrival Information Details Patient Name: [name], [name]. Date of Service: 09/03/2015 1:30 PM Medical Record Number: [id number] Patient Account Number: [id number] Date of Birth/Sex: [birth year] (64 y.o. Female) Treating RN: Afful, RN, BSN, [place] Sink Primary Care Physician: Aram Beecham Other Clinician: Referring Physician: Aram Beecham Treating Physician/Extender: Rudene Re in Treatment: 12 Visit Information History Since Last Visit Added or deleted any medications: No Patient Arrived: Ambulatory Any new allergies or adverse reactions: No Arrival Time: 13:31 Had a fall or experienced change in No Accompanied By: self activities of daily living that may affect Transfer Assistance: None risk of falls: Patient Identification Verified: Yes Signs or symptoms of abuse/neglect since last No Secondary Verification Process Yes visito Completed: Hospitalized since last visit: No Patient Requires Transmission- No Has Dressing in Place as Prescribed: Yes Based Precautions: Pain Present Now: No Patient Has Alerts: Yes Patient Alerts: Patient on Blood Thinner DMII warfarin Electronic Signature(s) Signed: 09/03/2015 1:31:24 PM By: Elpidio Eric BSN, RN Entered By: Elpidio Eric on 09/03/2015 13:31:24 Studstill, [name] ([id number]) -------------------------------------------------------------------------------- Encounter Discharge Information Details Patient Name: Dominique Marker K. Date of Service: 09/03/2015 1:30 PM Medical Record Number: [id number] Patient Account Number: [id number] Date of Birth/Sex: [birth year] (64 y.o. Female) Treating RN: Clover Mealy, RN, BSN, [place] Sink Primary Care Physician: Aram Beecham Other Clinician: Referring Physician: Aram Beecham Treating Physician/Extender: Rudene Re in Treatment: 12 Encounter Discharge Information Items Discharge Pain Level: 0 Discharge Condition: Stable Ambulatory  Status: Ambulatory Discharge Destination: Home Transportation: Private Auto Accompanied By: self Schedule Follow-up Appointment: No Medication Reconciliation completed and provided to Patient/Care No Deunta Beneke: Provided on Clinical Summary of Care: 09/03/2015 Form Type Recipient Paper Patient PP Electronic Signature(s) Signed: 09/03/2015 2:14:05 PM By: Gwenlyn Perking Entered By: Gwenlyn Perking on 09/03/2015 14:14:05 Blodgett, Larose K. ([id number]) -------------------------------------------------------------------------------- Lower Extremity Assessment Details Patient Name: Dominique Marker K. Date of Service: 09/03/2015 1:30 PM Medical Record Number: [id number] Patient Account Number: [id number] Date of Birth/Sex: 19-Mar-1951 (64 y.o. Female) Treating RN: Afful, RN, BSN, [place] [facility] Primary Care Physician: Aram Beecham Other Clinician: Referring Physician: Aram Beecham Treating Physician/Extender: Rudene Re in Treatment: 12 Vascular Assessment Pulses: Posterior Tibial Dorsalis Pedis Palpable: [Left:Yes] Extremity colors, hair growth, and conditions: Extremity Color: [Left:Normal] Hair Growth on Extremity: [Left:Yes] Temperature of Extremity: [Left:Warm] Capillary Refill: [Left:< 3 seconds] Toe Nail Assessment Left: Right: Thick: No Discolored: No Deformed: No Improper Length and Hygiene: No Electronic Signature(s) Signed: 09/03/2015 5:22:56 PM By: Elpidio Eric BSN, RN Entered By: Elpidio Eric on 09/03/2015 13:37:08 Yoho, [name] ([id number]) -------------------------------------------------------------------------------- Multi Wound Chart Details Patient Name: Dominique Marker K. Date of Service: 09/03/2015 1:30 PM Medical Record Number: [id number] Patient Account Number: [id number] Date of Birth/Sex: 1951/05/16 (64 y.o. Female) Treating RN: Clover Mealy, RN, BSN, [place] Sink Primary Care Physician: Aram Beecham Other Clinician: Referring Physician: Aram Beecham Treating  Physician/Extender: Rudene Re in Treatment: 12 Vital Signs Height(in): 68 Pulse(bpm): 68 Weight(lbs): 154 Blood Pressure 171/57 (mmHg): Body Mass Index(BMI): 23 Temperature(F): 97.9 Respiratory Rate 18 (breaths/min): Photos: [2:No Photos] [N/A:N/A] Wound Location: [2:Left Foot - Plantar] [N/A:N/A] Wounding Event: [2:Trauma] [N/A:N/A] Primary Etiology: [2:Diabetic Wound/Ulcer of the Lower Extremity] [N/A:N/A] Comorbid History: [2:Cataracts, Angina, Arrhythmia, Coronary Artery Disease, Hypertension, Peripheral Arterial Disease, Type II Diabetes] [N/A:N/A] Date Acquired: [2:05/21/2015] [N/A:N/A] Weeks of Treatment: [2:12] [N/A:N/A] Wound Status: [2:Open] [N/A:N/A] Measurements L x W x D 0.2x0.2x0.3 [N/A:N/A] (cm) Area (cm) : [2:0.031] [N/A:N/A] Volume (cm) : [2:0.009] [N/A:N/A] % Reduction in Area: [2:50.80%] [N/A:N/A] % Reduction in Volume: 64.00% [N/A:N/A]  Classification: [2:Grade 1] [N/A:N/A] Exudate Amount: [2:Small] [N/A:N/A] Exudate Type: [2:Serous] [N/A:N/A] Exudate Color: [2:amber] [N/A:N/A] Wound Margin: [2:Flat and Intact] [N/A:N/A] Granulation Amount: [2:Large (67-100%)] [N/A:N/A] Granulation Quality: [2:Pink] [N/A:N/A] Necrotic Amount: [2:None Present (0%)] [N/A:N/A] Exposed Structures: [2:Fascia: No Fat: No] [N/A:N/A] Tendon: No Muscle: No Joint: No Bone: No Limited to Skin Breakdown Epithelialization: Small (1-33%) N/A N/A Periwound Skin Texture: Callus: Yes N/A N/A Edema: No Excoriation: No Induration: No Crepitus: No Fluctuance: No Friable: No Rash: No Scarring: No Periwound Skin Moist: Yes N/A N/A Moisture: Maceration: No Dry/Scaly: No Periwound Skin Color: Atrophie Blanche: No N/A N/A Cyanosis: No Ecchymosis: No Erythema: No Hemosiderin Staining: No Mottled: No Pallor: No Rubor: No Temperature: No Abnormality N/A N/A Tenderness on Yes N/A N/A Palpation: Wound Preparation: Ulcer Cleansing: N/A N/A Rinsed/Irrigated  with Saline Topical Anesthetic Applied: Other: lidocaine 4% Treatment Notes Electronic Signature(s) Signed: 09/03/2015 5:22:56 PM By: Elpidio Eric BSN, RN Entered By: Elpidio Eric on 09/03/2015 13:40:09 Zillmer, [name] ([id number]) -------------------------------------------------------------------------------- Multi-Disciplinary Care Plan Details Patient Name: [name], BARTLEY. Date of Service: 09/03/2015 1:30 PM Medical Record Number: [id number] Patient Account Number: [id number] Date of Birth/Sex: 1950-09-15 (64 y.o. Female) Treating RN: Clover Mealy, RN, BSN, [place] Sink Primary Care Physician: Aram Beecham Other Clinician: Referring Physician: Aram Beecham Treating Physician/Extender: Rudene Re in Treatment: 12 Active Inactive Wound/Skin Impairment Nursing Diagnoses: Impaired tissue integrity Goals: Patient/caregiver will verbalize understanding of skin care regimen Date Initiated: 06/11/2015 Goal Status: Active Ulcer/skin breakdown will have a volume reduction of 30% by week 4 Date Initiated: 06/11/2015 Goal Status: Active Ulcer/skin breakdown will have a volume reduction of 50% by week 8 Date Initiated: 06/11/2015 Goal Status: Active Ulcer/skin breakdown will have a volume reduction of 80% by week 12 Date Initiated: 06/11/2015 Goal Status: Active Ulcer/skin breakdown will heal within 14 weeks Date Initiated: 06/11/2015 Goal Status: Active Interventions: Assess patient/caregiver ability to obtain necessary supplies Assess ulceration(s) every visit Notes: Electronic Signature(s) Signed: 09/03/2015 5:22:56 PM By: Elpidio Eric BSN, RN Entered By: Elpidio Eric on 09/03/2015 13:39:46 [name], [name] ([id number]) -------------------------------------------------------------------------------- Pain Assessment Details Patient Name: Dominique Marker K. Date of Service: 09/03/2015 1:30 PM Medical Record Number: [id number] Patient Account Number: [id number] Date of Birth/Sex:  05-28-1951 (64 y.o. Female) Treating RN: Clover Mealy, RN, BSN, [place] Sink Primary Care Physician: Aram Beecham Other Clinician: Referring Physician: Aram Beecham Treating Physician/Extender: Rudene Re in Treatment: 12 Active Problems Location of Pain Severity and Description of Pain Patient Has Paino No Site Locations Pain Management and Medication Current Pain Management: Electronic Signature(s) Signed: 09/03/2015 1:31:32 PM By: Elpidio Eric BSN, RN Entered By: Elpidio Eric on 09/03/2015 13:31:32 [name], [name] ([id number]) -------------------------------------------------------------------------------- Patient/Caregiver Education Details Patient Name: [name]. Date of Service: 09/03/2015 1:30 PM Medical Record Number: [id number] Patient Account Number: [id number] Date of Birth/Gender: [birth year] (65 y.o. Female) Treating RN: Clover Mealy, RN, BSN, [place] Sink Primary Care Physician: Aram Beecham Other Clinician: Referring Physician: Aram Beecham Treating Physician/Extender: Rudene Re in Treatment: 12 Education Assessment Education Provided To: Patient Education Topics Provided Basic Hygiene: Methods: Explain/Verbal Responses: State content correctly Wound Debridement: Methods: Explain/Verbal Responses: State content correctly Wound/Skin Impairment: Methods: Explain/Verbal Responses: State content correctly Electronic Signature(s) Signed: 09/03/2015 5:22:56 PM By: Elpidio Eric BSN, RN Entered By: Elpidio Eric on 09/03/2015 14:01:57 [name], [name] ([id number]) -------------------------------------------------------------------------------- Wound Assessment Details Patient Name: Dominique Marker K. Date of Service: 09/03/2015 1:30 PM Medical Record Number: [id number] Patient Account Number: [id number] Date of Birth/Sex: 02/11/1951 (64 y.o. Female) Treating RN: Afful, RN, BSN, [facility]  Physician: Aram Beecham Other Clinician: Referring Physician:  Aram Beecham Treating Physician/Extender: Rudene Re in Treatment: 12 Wound Status Wound Number: 2 Primary Diabetic Wound/Ulcer of the Lower Etiology: Extremity Wound Location: Left Foot - Plantar Wound Open Wounding Event: Trauma Status: Date Acquired: 05/21/2015 Comorbid Cataracts, Angina, Arrhythmia, Weeks Of Treatment: 12 History: Coronary Artery Disease, Hypertension, Clustered Wound: No Peripheral Arterial Disease, Type II Diabetes Photos Photo Uploaded By: Elpidio Eric on 09/03/2015 16:22:28 Wound Measurements Length: (cm) 0.2 Width: (cm) 0.2 Depth: (cm) 0.3 Area: (cm) 0.031 Volume: (cm) 0.009 % Reduction in Area: 50.8% % Reduction in Volume: 64% Epithelialization: Small (1-33%) Tunneling: No Undermining: No Wound Description Classification: Grade 1 Wound Margin: Flat and Intact Exudate Amount: Small Exudate Type: Serous Exudate Color: amber Foul Odor After Cleansing: No Wound Bed Granulation Amount: Large (67-100%) Exposed Structure Granulation Quality: Pink Fascia Exposed: No Necrotic Amount: None Present (0%) Fat Layer Exposed: No [name], [name] ([id number]) Tendon Exposed: No Muscle Exposed: No Joint Exposed: No Bone Exposed: No Limited to Skin Breakdown Periwound Skin Texture Texture Color No Abnormalities Noted: No No Abnormalities Noted: No Callus: Yes Atrophie Blanche: No Crepitus: No Cyanosis: No Excoriation: No Ecchymosis: No Fluctuance: No Erythema: No Friable: No Hemosiderin Staining: No Induration: No Mottled: No Localized Edema: No Pallor: No Rash: No Rubor: No Scarring: No Temperature / Pain Moisture Temperature: No Abnormality No Abnormalities Noted: No Tenderness on Palpation: Yes Dry / Scaly: No Maceration: No Moist: Yes Wound Preparation Ulcer Cleansing: Rinsed/Irrigated with Saline Topical Anesthetic Applied: Other: lidocaine 4%, Treatment Notes Wound #2 (Left, Plantar Foot) 1. Cleansed  with: Clean wound with Normal Saline 4. Dressing Applied: Prisma Ag 5. Secondary Dressing Applied Bordered Foam Dressing Notes darco shoe with peg assist Electronic Signature(s) Signed: 09/03/2015 5:22:56 PM By: Elpidio Eric BSN, RN Entered By: Elpidio Eric on 09/03/2015 13:39:36 [name], [name] ([id number]) -------------------------------------------------------------------------------- Vitals Details Patient Name: Dominique Marker K. Date of Service: 09/03/2015 1:30 PM Medical Record Number: [id number] Patient Account Number: [id number] Date of Birth/Sex: May 11, 1951 (64 y.o. Female) Treating RN: Afful, RN, BSN, [place] Primary Care Physician: Aram Beecham Other Clinician: Referring Physician: Aram Beecham Treating Physician/Extender: Rudene Re in Treatment: 12 Vital Signs Time Taken: 13:36 Temperature (F): 97.9 Height (in): 68 Pulse (bpm): 68 Weight (lbs): 154 Respiratory Rate (breaths/min): 18 Body Mass Index (BMI): 23.4 Blood Pressure (mmHg): 171/57 Reference Range: 80 - 120 mg / dl Electronic Signature(s) Signed: 09/03/2015 5:22:56 PM By: Elpidio Eric BSN, RN Entered By: Elpidio Eric on 09/03/2015 13:36:43

## 2015-09-04 NOTE — Progress Notes (Signed)
ROSHNI, BURBANO ([id number]) Visit Report for 09/03/2015 Chief Complaint Document Details Patient Name: [name], [name]. Date of Service: 09/03/2015 1:30 PM Medical Record Patient Account Number: [id number] Number: Afful, RN, BSN, Treating RN: 23-Apr-1951 (65 y.o. 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 Electronic Signature(s) Signed: 09/03/2015 2:17:13 PM By: Evlyn Kanner MD, FACS Entered By: Evlyn Kanner on 09/03/2015 14:17:13 [name], [name] ([id number]) -------------------------------------------------------------------------------- Debridement Details Patient Name: Dominique Marker K. Date of Service: 09/03/2015 1:30 PM Medical Record Patient Account Number: [id number] Number: Afful, RN, BSN, Treating RN: 06-10-1951 (65 y.o. [place] Sink Date of Birth/Sex: Female) Other Clinician: Primary Care Physician: Aram Beecham Treating Evlyn Kanner Referring Physician: Aram Beecham Physician/Extender: Tania Ade in Treatment: 12 Debridement Performed for Wound #2 Left,Plantar Foot Assessment: Performed By: Physician Evlyn Kanner, MD Debridement: Debridement Pre-procedure Yes Verification/Time Out Taken: Start Time: 13:52 Pain Control: Lidocaine 4% Topical Solution Level: Skin/Subcutaneous Tissue Total Area Debrided (L x 0.2 (cm) x 0.2 (cm) = 0.04 (cm) W): Tissue and other Viable, Non-Viable, Callus, Fibrin/Slough, Skin, Subcutaneous material debrided: Instrument: Blade, Forceps Bleeding: None End Time: 13:58 Procedural Pain: 0 Post Procedural Pain: 0 Response to Treatment: Procedure was tolerated well Post Debridement Measurements of Total Wound Length: (cm) 0.5 Width: (cm) 0.5 Depth: (cm)  0.3 Volume: (cm) 0.059 Post Procedure Diagnosis Same as Pre-procedure Electronic Signature(s) Signed: 09/03/2015 2:17:01 PM By: Evlyn Kanner MD, FACS Signed: 09/03/2015 5:22:56 PM By: Elpidio Eric BSN, RN Entered By: Evlyn Kanner on 09/03/2015 14:17:01 [name], [name] ([id number]) -------------------------------------------------------------------------------- HPI Details Patient Name: Dominique Marker K. Date of Service: 09/03/2015 1:30 PM Medical Record Patient Account Number: [id number] Number: Afful, RN, BSN, Treating RN: Apr 06, 1951 (65 y.o. [place] Sink Date of Birth/Sex: Female) Other Clinician: Primary Care Physician: Aram Beecham Treating Evlyn Kanner Referring Physician: Aram Beecham Physician/Extender: Tania Ade in Treatment: 12 History of Present Illness HPI Description: Very pleasant 65 year old with past medical history significant for type 2 diabetes (hemoglobin A1c 7.3 in June 2016), peripheral neuropathy, peripheral vascular disease, and coronary artery disease. 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 She developed an ulceration in between her left third and fourth metatarsal heads in April 2014 after stepping on a piece of glass. She underwent LLE angioplasty in June 2014 by Dr. [name]. She was treated at the [facility] wound clinic with hyperbaric oxygen therapy for a deep tissue infection, which grew MRSA and did not significantly improve with antibiotics. X-rays showed no evidence for underlying osteomyelitis. She also underwent placement of Dermagraft x7. She completed a total of 60 HBO treatments at [facility] in Feb 2015. In-chamber TCOM >722mmHg. Her ulcer worsened after stopping HBO and she completed an additional 30 treatments (90 total) with significant improvement (eventually healed). She underwent angioplasty of her left peroneal artery, tibioperoneal trunk, and  superficial femoral artery on September 26, 2013. MRI on 10/12/13 showed no evidence for osteomyelitis or abscess. s/p Grafix x 5 and Epifix x 3 application with significant improvement. Significantly improved after starting to offload with knee walker, which she used at home but not in public for fear of falling. Also using darco shoe. Refused total contact cast secondary to fall history. Her ulcer was healed at her last clinic visit in Feb 2016. She returned to clinic after stepping  on a piece of plastic in October 2016. She developed a painful callus and was seen by Dr. Alberteen Spindle in podiatry. The callus was debrided, and she was found to have an underlying ulceration. X-ray reportedly showed no evidence for underlying osteomyelitis. Repeat x-ray 07/16/2015 showed no evidence for osteomyelitis. She reports moderate pain with pressure. No claudication or rest pain. ABI 0.8. She has been applying silver alginate and offloading with a knee walker when possible and a postop shoe and new orthotics. Epifix initially applied 07/30/2015 with significant improvement. She returns to clinic for follow-up and is without new complaints. Moderate pain with pressure per above. No fever or chills. Minimal bloody drainage. Electronic Signature(s) Signed: 09/03/2015 2:17:35 PM By: Evlyn Kanner MD, FACS Entered By: Evlyn Kanner on 09/03/2015 14:17:35 [name] ([id number]) -------------------------------------------------------------------------------- Physical Exam Details Patient Name: Dominique Marker K. Date of Service: 09/03/2015 1:30 PM Medical Record Patient Account Number: [id number] Number: Afful, RN, BSN, Treating RN: [birth year] (65 y.o. [place] Sink Date of Birth/Sex: Female) Other Clinician: Primary Care Physician: Aram Beecham Treating Evlyn Kanner Referring Physician: Aram Beecham Physician/Extender: Weeks in Treatment: 12 Constitutional . Pulse regular. Respirations normal and  unlabored. Afebrile. . Eyes Nonicteric. Reactive to light. Ears, Nose, Mouth, and Throat Lips, teeth, and gums WNL.Marland Kitchen Moist mucosa without lesions. Neck supple and nontender. No palpable supraclavicular or cervical adenopathy. Normal sized without goiter. Respiratory WNL. No retractions.. Cardiovascular Pedal Pulses WNL. No clubbing, cyanosis or edema. Lymphatic No adneopathy. No adenopathy. No adenopathy. Musculoskeletal Adexa without tenderness or enlargement.. Digits and nails w/o clubbing, cyanosis, infection, petechiae, ischemia, or inflammatory conditions.. Integumentary (Hair, Skin) No suspicious lesions. No crepitus or fluctuance. No peri-wound warmth or erythema. No masses.Marland Kitchen Psychiatric Judgement and insight Intact.. No evidence of depression, anxiety, or agitation.. Notes there is significant amount of callus which need sharp debridement as the ulcer has been covered with callus. After debriding it with a forcep and a 15 blade there is a smaller shallow ulcer which has been saucerized. No bleeding during the dissection Electronic Signature(s) Signed: 09/03/2015 2:18:23 PM By: Evlyn Kanner MD, FACS Entered By: Evlyn Kanner on 09/03/2015 14:18:22 [name], [name] ([id number]) -------------------------------------------------------------------------------- Physician Orders Details Patient Name: Dominique Marker K. Date of Service: 09/03/2015 1:30 PM Medical Record Patient Account Number: [id number] Number: Afful, RN, BSN, Treating RN: September 26, 1950 (65 y.o. [place] Sink Date of Birth/Sex: Female) Other Clinician: Primary Care Physician: Aram Beecham Treating Evlyn Kanner Referring Physician: Aram Beecham Physician/Extender: Tania Ade in Treatment: 12 Verbal / Phone Orders: Yes Clinician: Afful, RN, BSN, Rita Read Back and Verified: Yes Diagnosis Coding Wound Cleansing Wound #2 Left,Plantar Foot o Cleanse wound with mild soap and water o May Shower, gently  pat wound dry prior to applying new dressing. Anesthetic Wound #2 Left,Plantar Foot o Topical Lidocaine 4% cream applied to wound bed prior to debridement Primary Wound Dressing Wound #2 Left,Plantar Foot o Prisma Ag Secondary Dressing Wound #2 Left,Plantar Foot o Boardered Foam Dressing Dressing Change Frequency Wound #2 Left,Plantar Foot o Change dressing every other day. Follow-up Appointments Wound #2 Left,Plantar Foot o Return Appointment in 1 week. Off-Loading Wound #2 Left,Plantar Foot o Open toe surgical shoe with peg assist. o Other: - felt Additional Orders / Instructions Wound #2 Left,Plantar Foot o Increase protein intake. CHEETARA, HOGE ([id number]) o Activity as tolerated o Other: - MVI, Vit C, Zinc Electronic Signature(s) Signed: 09/03/2015 4:12:51 PM By: Evlyn Kanner MD, FACS Signed: 09/03/2015 5:22:56 PM By: Elpidio Eric BSN, RN Entered By: Elpidio Eric on 09/03/2015  14:01:30 [name], [name] ([id number]) -------------------------------------------------------------------------------- Problem List Details Patient Name: [name], RIESS. Date of Service: 09/03/2015 1:30 PM Medical Record Patient Account Number: [id number] Number: Afful, RN, BSN, Treating RN: 17-Dec-1950 (65 y.o. 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 ([id number]) -------------------------------------------------------------------------------- Progress Note Details Patient Name: Dominique Marker K. Date of Service: 09/03/2015 1:30 PM Medical Record Patient Account Number: [id number] Number: Afful, RN, BSN, Treating RN: 1950-09-21 (65 y.o. 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 History of Present Illness (HPI) Very pleasant 65 year old with past medical history significant for type 2 diabetes (hemoglobin A1c 7.3 in June 2016), peripheral neuropathy, peripheral vascular disease, and coronary artery disease. 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 She developed an ulceration in between her left third and fourth metatarsal heads in April 2014 after stepping on a piece of glass. She underwent LLE angioplasty in June 2014 by Dr. [name]. She was treated at the [facility] wound clinic with hyperbaric oxygen therapy for a deep tissue infection, which grew MRSA and did not significantly improve with antibiotics. X-rays showed no evidence for underlying osteomyelitis. She also underwent placement of Dermagraft x7. She completed a total of 60 HBO treatments at [facility] in Feb 2015. In-chamber TCOM >777mmHg. Her ulcer worsened after stopping HBO and she completed an additional 30 treatments (90 total) with significant improvement (eventually healed). She underwent angioplasty of her left peroneal artery, tibioperoneal trunk, and  superficial femoral artery on September 26, 2013. MRI on 10/12/13 showed no evidence for osteomyelitis or abscess. s/p Grafix x 5 and Epifix x 3 application with significant improvement. Significantly improved after starting to offload with knee walker, which she used at home but not in public for fear of falling. Also using darco shoe. Refused total contact cast secondary to fall history. Her ulcer was healed at her last clinic visit in Feb 2016. She returned to clinic after stepping on a piece of plastic in October 2016. She developed a painful callus and was seen by Dr. Alberteen Spindle in podiatry. The callus was debrided, and she was found to have an underlying ulceration. X-ray reportedly showed no evidence for underlying osteomyelitis. Repeat x-ray 07/16/2015 showed no evidence for osteomyelitis. She reports moderate pain with pressure. No claudication or rest pain. ABI 0.8. She has been applying silver alginate and offloading with a knee walker when possible and a postop shoe and new orthotics. Epifix initially applied 07/30/2015 with significant improvement. She returns to clinic for follow-up and is without new complaints. Moderate pain with pressure per above. No fever or chills. Minimal bloody drainage. [name], [name] ([id number]) Objective Constitutional Pulse regular. Respirations normal and unlabored.  Afebrile. Vitals Time Taken: 1:36 PM, Height: 68 in, Weight: 154 lbs, BMI: 23.4, Temperature: 97.9 F, Pulse: 68 bpm, Respiratory Rate: 18 breaths/min, Blood Pressure: 171/57 mmHg. Eyes Nonicteric. Reactive to light. Ears, Nose, Mouth, and Throat Lips, teeth, and gums WNL.Marland Kitchen Moist mucosa without lesions. Neck supple and nontender. No palpable supraclavicular or cervical adenopathy. Normal sized without goiter. Respiratory WNL. No retractions.. Cardiovascular Pedal Pulses WNL. No clubbing, cyanosis or edema. Lymphatic No adneopathy. No adenopathy. No  adenopathy. Musculoskeletal Adexa without tenderness or enlargement.. Digits and nails w/o clubbing, cyanosis, infection, petechiae, ischemia, or inflammatory conditions.Marland Kitchen Psychiatric Judgement and insight Intact.. No evidence of depression, anxiety, or agitation.. General Notes: there is significant amount of callus which need sharp debridement as the ulcer has been covered with callus. After debriding it with a forcep and a 15 blade there is a smaller shallow ulcer which has been saucerized. No bleeding during the dissection Integumentary (Hair, Skin) No suspicious lesions. No crepitus or fluctuance. No peri-wound warmth or erythema. No masses.. Wound #2 status is Open. Original cause of wound was Trauma. The wound is located on the Left,Plantar Foot. The wound measures 0.2cm length x 0.2cm width x 0.3cm depth; 0.031cm^2 area and 0.009cm^3 volume. The wound is limited to skin breakdown. There is no tunneling or undermining noted. There is a small amount of serous drainage noted. The wound margin is flat and intact. There is large (67-100%) pink granulation within the wound bed. There is no necrotic tissue within the wound bed. The periwound skin [name], [name] ([id number]) appearance exhibited: Callus, Moist. The periwound skin appearance did not exhibit: Crepitus, Excoriation, Fluctuance, Friable, Induration, Localized Edema, Rash, Scarring, Dry/Scaly, Maceration, Atrophie Blanche, Cyanosis, Ecchymosis, Hemosiderin Staining, Mottled, Pallor, Rubor, Erythema. Periwound temperature was noted as No Abnormality. The periwound has tenderness on palpation. Assessment Active Problems ICD-10 E11.621 - Type 2 diabetes mellitus with foot ulcer E11.40 - Type 2 diabetes mellitus with diabetic neuropathy, unspecified I70.245 - Atherosclerosis of native arteries of left leg with ulceration of other part of foot I25.10 - Atherosclerotic heart disease of native coronary artery without angina  pectoris I have recommended we packed it with Prisma AG and uses surrounding offloading felt ring and a bordered foam. She is using diabetic shoes for offloading. I would consider total contact cast if she is able to tolerate it Procedures Wound #2 Wound #2 is a Diabetic Wound/Ulcer of the Lower Extremity located on the Left,Plantar Foot . There was a Skin/Subcutaneous Tissue Debridement ([id number]) debridement with total area of 0.04 sq cm performed by Evlyn Kanner, MD. with the following instrument(s): Blade and Forceps to remove Viable and Non-Viable tissue/material including Fibrin/Slough, Skin, Callus, and Subcutaneous after achieving pain control using Lidocaine 4% Topical Solution. A time out was conducted prior to the start of the procedure. There was no bleeding. The procedure was tolerated well with a pain level of 0 throughout and a pain level of 0 following the procedure. Post Debridement Measurements: 0.5cm length x 0.5cm width x 0.3cm depth; 0.059cm^3 volume. Post procedure Diagnosis Wound #2: Same as Pre-Procedure Plan WILLAMAE, DEMBY. ([id number]) Wound Cleansing: Wound #2 Left,Plantar Foot: Cleanse wound with mild soap and water May Shower, gently pat wound dry prior to applying new dressing. Anesthetic: Wound #2 Left,Plantar Foot: Topical Lidocaine 4% cream applied to wound bed prior to debridement Primary Wound Dressing: Wound #2 Left,Plantar Foot: Prisma Ag Secondary Dressing: Wound #2 Left,Plantar Foot: Boardered Foam Dressing Dressing Change Frequency: Wound #2 Left,Plantar Foot: Change dressing every other  day. Follow-up Appointments: Wound #2 Left,Plantar Foot: Return Appointment in 1 week. Off-Loading: Wound #2 Left,Plantar Foot: Open toe surgical shoe with peg assist. Other: - felt Additional Orders / Instructions: Wound #2 Left,Plantar Foot: Increase protein intake. Activity as tolerated Other: - MVI, Vit C, Zinc I have recommended we packed  it with Prisma AG and uses surrounding offloading felt ring and a bordered foam. She is using diabetic shoes for offloading. I would consider total contact cast if she is able to tolerate it Electronic Signature(s) Signed: 09/03/2015 2:19:15 PM By: Evlyn Kanner MD, FACS Entered By: Evlyn Kanner on 09/03/2015 14:19:15 [name], [name] ([id number]) -------------------------------------------------------------------------------- SuperBill Details Patient Name: Dominique Marker K. Date of Service: 09/03/2015 Medical Record Patient Account Number: [id number] Number: Afful, RN, BSN, Treating RN: 15-Apr-1951 (65 y.o. [place] Sink Date of Birth/Sex: Female) Other Clinician: Primary Care Physician: Aram Beecham Treating Evlyn Kanner Referring Physician: Aram Beecham Physician/Extender: Tania Ade in Treatment: 12 Diagnosis Coding ICD-10 Codes Code Description E11.621 Type 2 diabetes mellitus with foot ulcer E11.40 Type 2 diabetes mellitus with diabetic neuropathy, unspecified I70.245 Atherosclerosis of native arteries of left leg with ulceration of other part of foot I25.10 Atherosclerotic heart disease of native coronary artery without angina pectoris Facility Procedures CPT4: Description Modifier Quantity Code [id number] 11042 - DEB SUBQ TISSUE 20 SQ CM/< 1 ICD-10 Description Diagnosis E11.621 Type 2 diabetes mellitus with foot ulcer E11.40 Type 2 diabetes mellitus with diabetic neuropathy, unspecified I70.245  Atherosclerosis of native arteries of left leg with ulceration of other part of foot I25.10 Atherosclerotic heart disease of native coronary artery without angina pectoris Physician Procedures CPT4: Description Modifier Quantity Code [id number] 11042 - WC PHYS SUBQ TISS 20 SQ CM 1 ICD-10 Description Diagnosis E11.621 Type 2 diabetes mellitus with foot ulcer E11.40 Type 2 diabetes mellitus with diabetic neuropathy, unspecified I70.245  Atherosclerosis of native arteries of left leg with  ulceration of other part of foot I25.10 Atherosclerotic heart disease of native coronary artery without angina pectoris [name], Dorita K. ([id number]) Electronic Signature(s) Signed: 09/03/2015 2:19:30 PM By: Evlyn Kanner MD, FACS Entered By: Evlyn Kanner on 09/03/2015 14:19:30

## 2015-09-10 ENCOUNTER — Encounter: Payer: Medicare Other | Attending: Internal Medicine | Admitting: Internal Medicine

## 2015-09-10 ENCOUNTER — Ambulatory Visit (INDEPENDENT_AMBULATORY_CARE_PROVIDER_SITE_OTHER): Payer: Medicare Other

## 2015-09-10 DIAGNOSIS — L97521 Non-pressure chronic ulcer of other part of left foot limited to breakdown of skin: Secondary | ICD-10-CM | POA: Diagnosis not present

## 2015-09-10 DIAGNOSIS — I2510 Atherosclerotic heart disease of native coronary artery without angina pectoris: Secondary | ICD-10-CM | POA: Insufficient documentation

## 2015-09-10 DIAGNOSIS — I2699 Other pulmonary embolism without acute cor pulmonale: Secondary | ICD-10-CM

## 2015-09-10 DIAGNOSIS — I480 Paroxysmal atrial fibrillation: Secondary | ICD-10-CM

## 2015-09-10 DIAGNOSIS — E1140 Type 2 diabetes mellitus with diabetic neuropathy, unspecified: Secondary | ICD-10-CM | POA: Insufficient documentation

## 2015-09-10 DIAGNOSIS — E11621 Type 2 diabetes mellitus with foot ulcer: Secondary | ICD-10-CM | POA: Insufficient documentation

## 2015-09-10 DIAGNOSIS — I10 Essential (primary) hypertension: Secondary | ICD-10-CM | POA: Insufficient documentation

## 2015-09-10 DIAGNOSIS — I70245 Atherosclerosis of native arteries of left leg with ulceration of other part of foot: Secondary | ICD-10-CM | POA: Diagnosis not present

## 2015-09-10 DIAGNOSIS — Z5181 Encounter for therapeutic drug level monitoring: Secondary | ICD-10-CM | POA: Diagnosis not present

## 2015-09-10 LAB — POCT INR: INR: 1.4

## 2015-09-12 ENCOUNTER — Ambulatory Visit: Payer: Medicare Other | Admitting: Cardiovascular Disease

## 2015-09-12 NOTE — Progress Notes (Signed)
[name] ([id number]) Visit Report for 09/10/2015 Chief Complaint Document Details Patient Name: [name], [name]. Date of Service: 09/10/2015 9:15 AM Medical Record Patient Account Number: [id number] Number: Treating RN: Curtis Sites 05/16/1951 (65 y.o. 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 Electronic Signature(s) Signed: 09/10/2015 4:36:57 PM By: Baltazar Najjar MD Entered By: Baltazar Najjar on 09/10/2015 40:98:11 [name] ([id number]) -------------------------------------------------------------------------------- Debridement Details Patient Name: [name]. Date of Service: 09/10/2015 9:15 AM Medical Record Patient Account Number: [id number] Number: Treating RN: Curtis Sites [birth year] (65 y.o. Other Clinician: Date of Birth/Sex: Female) Treating ROBSON, MICHAEL Primary Care Physician/Extender: Tawanna Solo Physician: Referring Physician: Bing Plume in Treatment: 13 Debridement Performed for Wound #2 Left,Plantar Foot Assessment: Performed By: Physician Maxwell Caul, MD Debridement: Debridement Pre-procedure Yes Verification/Time Out Taken: Start Time: 09:43 Pain Control: Lidocaine 4% Topical Solution Level: Skin/Subcutaneous Tissue Total Area Debrided (L x 0.2 (cm) x 0.2 (cm) = 0.04 (cm) W): Tissue and other Viable, Non-Viable, Callus material debrided: Instrument: Blade, Forceps Bleeding: Moderate Hemostasis Achieved: Pressure End Time: 09:48 Procedural Pain: 0 Post Procedural Pain: 0 Post Debridement Measurements of Total Wound Length: (cm) 0.2 Width: (cm) 0.2 Depth: (cm) 0.3 Volume: (cm) 0.009 Post Procedure Diagnosis Same  as Pre-procedure Electronic Signature(s) Signed: 09/10/2015 4:36:57 PM By: Baltazar Najjar MD Signed: 09/10/2015 5:13:21 PM By: Curtis Sites Entered By: Baltazar Najjar on 09/10/2015 10:26:06 [name], [name] ([id number]) -------------------------------------------------------------------------------- HPI Details Patient Name: Dominique Marker K. Date of Service: 09/10/2015 9:15 AM Medical Record Patient Account Number: [id number] Number: Treating RN: Curtis Sites May 02, 1951 (65 y.o. Other Clinician: Date of Birth/Sex: Female) Treating ROBSON, MICHAEL Primary Care Physician/Extender: Tawanna Solo Physician: Referring Physician: Bing Plume in Treatment: 13 History of Present Illness HPI Description: Very pleasant 65 year old with past medical history significant for type 2 diabetes (hemoglobin A1c 7.3 in June 2016), peripheral neuropathy, peripheral vascular disease, and coronary artery disease. 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 She developed an ulceration in between her left third and fourth metatarsal heads in April 2014 after stepping on a piece of glass. She underwent LLE angioplasty in June 2014 by Dr. [name]. She was treated at the [facility] wound clinic with hyperbaric oxygen therapy for a deep tissue infection, which grew MRSA and did not significantly improve with antibiotics. X-rays showed no evidence for underlying osteomyelitis. She also underwent placement of Dermagraft x7. She completed a total of 60 HBO treatments at [facility] in Feb 2015. In-chamber TCOM >755mmHg. Her ulcer worsened after stopping HBO and she completed an additional 30 treatments (90 total) with significant improvement (eventually healed). She underwent angioplasty of her left peroneal artery, tibioperoneal trunk, and superficial femoral artery on September 26, 2013. MRI on 10/12/13 showed no  evidence for osteomyelitis or abscess. s/p Grafix x 5 and Epifix x 3 application with significant improvement. Significantly improved after starting to offload with knee walker, which she used at home but not in public for fear of falling. Also using darco shoe. Refused total contact cast secondary to fall history. Her ulcer was healed at her last clinic visit in Feb 2016. She returned to clinic after stepping on a piece of plastic in October 2016. She developed a painful callus  and was seen by Dr. Alberteen Spindle in podiatry. The callus was debrided, and she was found to have an underlying ulceration. X-ray reportedly showed no evidence for underlying osteomyelitis. Repeat x-ray 07/16/2015 showed no evidence for osteomyelitis. She reports moderate pain with pressure. No claudication or rest pain. ABI 0.8. She has been applying silver alginate and offloading with a knee walker when possible and a postop shoe and new orthotics. Epifix initially applied 07/30/2015 with significant improvement. She returns to clinic for follow-up and is without new complaints. Moderate pain with pressure per above. No fever or chills. Minimal bloody drainage. 09/10/15; this patient has a small wound that has roughly 3 mm in depth. There is some undermining at the base of this perhaps 2 or 3 mm. The area is painful which the patient attributes to a cast. Patient has diabetic neuropathy. We'll use Prisma last time. The patient has an offloading shoe. She is reluctant to try a cast due to falling. Electronic Signature(s) KENIAH, KLEMMER ([id number]) Signed: 09/10/2015 4:36:57 PM By: Baltazar Najjar MD Entered By: Baltazar Najjar on 09/10/2015 10:30:06 [name] ([id number]) -------------------------------------------------------------------------------- Physical Exam Details Patient Name: [name], [name] Date of Service: 09/10/2015 9:15 AM Medical Record Patient Account Number: [id number] Number: Treating  RN: Curtis Sites [birth year] (65 y.o. Other Clinician: Date of Birth/Sex: Female) Treating ROBSON, MICHAEL Primary Care Physician/Extender: Tawanna Solo Physician: Referring Physician: Bing Plume in Treatment: 13 Notes Wound exam; there is a mild amount of callus and some nonviable subcutaneous tissue which was debridement. She has a small probing wound however I think there is probably undermining at the base of this. Although she attributes her pain to the callus I wonder whether there is more to this wound and meets the eye Electronic Signature(s) Signed: 09/10/2015 4:36:57 PM By: Baltazar Najjar MD Entered By: Baltazar Najjar on 09/10/2015 10:31:39 [name], [name] ([id number]) -------------------------------------------------------------------------------- Physician Orders Details Patient Name: Dominique Marker K. Date of Service: 09/10/2015 9:15 AM Medical Record Patient Account Number: [id number] Number: Treating RN: Curtis Sites Jan 06, 1951 (65 y.o. Other Clinician: Date of Birth/Sex: Female) Treating ROBSON, MICHAEL Primary Care Physician/Extender: Tawanna Solo Physician: Referring Physician: Bing Plume in Treatment: 13 Verbal / Phone Orders: Yes Clinician: Curtis Sites Read Back and Verified: Yes Diagnosis Coding Wound Cleansing Wound #2 Left,Plantar Foot o Cleanse wound with mild soap and water o May Shower, gently pat wound dry prior to applying new dressing. Anesthetic Wound #2 Left,Plantar Foot o Topical Lidocaine 4% cream applied to wound bed prior to debridement Primary Wound Dressing Wound #2 Left,Plantar Foot o Iodosorb Ointment Secondary Dressing Wound #2 Left,Plantar Foot o Boardered Foam Dressing Dressing Change Frequency Wound #2 Left,Plantar Foot o Change dressing every other day. Follow-up Appointments Wound #2 Left,Plantar Foot o Return Appointment in 1 week. Off-Loading Wound #2  Left,Plantar Foot o Open toe surgical shoe with peg assist. o Other: - felt Additional Orders / Instructions Wound #2 Left,Plantar Foot [name], [name] ([id number]) o Increase protein intake. o Activity as tolerated o Other: - MVI, Vit C, Zinc Electronic Signature(s) Signed: 09/10/2015 4:36:57 PM By: Baltazar Najjar MD Signed: 09/10/2015 5:13:21 PM By: Curtis Sites Entered By: Curtis Sites on 09/10/2015 09:54:08 [name], [name] ([id number]) -------------------------------------------------------------------------------- Problem List Details Patient Name: [name], [name] Date of Service: 09/10/2015 9:15 AM Medical Record Patient Account Number: [id number] Number: Treating RN: Curtis Sites [birth year] (65 y.o. Other Clinician: Date of Birth/Sex: Female) Treating ROBSON, MICHAEL Primary Care Physician/Extender: Tawanna Solo Physician:  Referring Physician: Bing Plume in Treatment: 13 Active Problems ICD-10 Encounter Code Description Active Date Diagnosis E11.621 Type 2 diabetes mellitus with foot ulcer 06/11/2015 Yes E11.40 Type 2 diabetes mellitus with diabetic neuropathy, 06/11/2015 Yes unspecified I70.245 Atherosclerosis of native arteries of left leg with ulceration 06/11/2015 Yes of other part of foot I25.10 Atherosclerotic heart disease of native coronary artery 06/11/2015 Yes without angina pectoris Inactive Problems Resolved Problems Electronic Signature(s) Signed: 09/10/2015 4:36:57 PM By: Baltazar Najjar MD Entered By: Baltazar Najjar on 09/10/2015 10:25:48 [name], [name] ([id number]) -------------------------------------------------------------------------------- Progress Note Details Patient Name: Dominique Marker K. Date of Service: 09/10/2015 9:15 AM Medical Record Patient Account Number: [id number] Number: Treating RN: Curtis Sites [birth year] (65 y.o. Other Clinician: Date of Birth/Sex: Female) Treating  ROBSON, MICHAEL Primary Care Physician/Extender: Tawanna Solo Physician: Referring Physician: Bing Plume in Treatment: 13 Plan #1 changed her to Iodosorb, offloading foam, continuing the patient's own offloading shoe. #2 I did discuss a contact cast with her although she expressed her reluctance due to a history of falling. A forefoot off loader might be an option #3 I don't think this is going to heal unless the pressure can be relieved. A deeper debridement to expose the undermining is also something that might be necessary although I'm hopeful that will not be the case Electronic Signature(s) Signed: 09/10/2015 4:36:57 PM By: Baltazar Najjar MD Entered By: Baltazar Najjar on 09/10/2015 10:33:06 [name], [name] ([id number]) -------------------------------------------------------------------------------- SuperBill Details Patient Name: Dominique Marker K. Date of Service: 09/10/2015 Medical Record Patient Account Number: [id number] Number: Treating RN: Curtis Sites 08-14-1950 (65 y.o. 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 of other part of foot I25.10 Atherosclerotic heart disease of native coronary artery without angina pectoris Facility Procedures CPT4 Code: 56213086 Description: 11042 - DEB SUBQ TISSUE 20 SQ CM/< ICD-10 Description Diagnosis E11.621 Type 2 diabetes mellitus with foot ulcer Modifier: Quantity: 1 Physician Procedures CPT4 Code: [id number] Description: 11042 - WC PHYS SUBQ TISS 20 SQ CM ICD-10 Description Diagnosis E11.621 Type 2 diabetes mellitus with  foot ulcer Modifier: Quantity: 1 Electronic Signature(s) Signed: 09/10/2015 4:36:57 PM By: Baltazar Najjar MD Entered By: Baltazar Najjar on 09/10/2015 10:33:32

## 2015-09-12 NOTE — Progress Notes (Signed)
[name], [name] ([id number]) Visit Report for 09/10/2015 Arrival Information Details Patient Name: [name], [name]. Date of Service: 09/10/2015 9:15 AM Medical Record Patient Account Number: [id number] Number: Treating RN: Curtis Sites 1950/09/20 (65 y.o. Other Clinician: Date of Birth/Sex: Female) Treating ROBSON, MICHAEL Primary Care Physician: Aram Beecham Physician/Extender: G Referring Physician: Bing Plume in Treatment: 13 Visit Information History Since Last Visit Added or deleted any medications: No Patient Arrived: Ambulatory Any new allergies or adverse reactions: No Arrival Time: 09:17 Had a fall or experienced change in No Accompanied By: self activities of daily living that may affect Transfer Assistance: None risk of falls: Patient Identification Verified: Yes Signs or symptoms of abuse/neglect since last No Secondary Verification Process Yes visito Completed: Hospitalized since last visit: No Patient Requires Transmission- No Pain Present Now: No Based Precautions: Patient Has Alerts: Yes Patient Alerts: Patient on Blood Thinner DMII warfarin Electronic Signature(s) Signed: 09/10/2015 5:13:21 PM By: Curtis Sites Entered By: Curtis Sites on 09/10/2015 09:20:14 Witherell, [name] ([id number]) -------------------------------------------------------------------------------- Encounter Discharge Information Details Patient Name: [name]. Date of Service: 09/10/2015 9:15 AM Medical Record Patient Account Number: [id number] Number: Treating RN: Curtis Sites 12/24/1950 (65 y.o. Other Clinician: Date of Birth/Sex: Female) Treating ROBSON, MICHAEL Primary Care Physician: Aram Beecham Physician/Extender: G Referring Physician: Bing Plume in Treatment: 13 Encounter Discharge Information Items Discharge Pain Level: 0 Discharge Condition: Stable Ambulatory Status: Ambulatory Discharge Destination:  Home Transportation: Private Auto Accompanied By: self Schedule Follow-up Appointment: Yes Medication Reconciliation completed and provided to Patient/Care No Faron Whitelock: Provided on Clinical Summary of Care: 10/08/2015 Form Type Recipient Paper Patient PP Electronic Signature(s) Signed: 09/10/2015 1:27:39 PM By: Curtis Sites Previous Signature: 09/10/2015 10:09:35 AM Version By: Gwenlyn Perking Entered By: Curtis Sites on 09/10/2015 13:27:39 Anspach, [name] ([id number]) -------------------------------------------------------------------------------- Lower Extremity Assessment Details Patient Name: Dominique Marker K. Date of Service: 09/10/2015 9:15 AM Medical Record Patient Account Number: [id number] Number: Treating RN: Curtis Sites 1950/12/12 (65 y.o. Other Clinician: Date of Birth/Sex: Female) Treating ROBSON, MICHAEL Primary Care Physician: Aram Beecham Physician/Extender: G Referring Physician: Bing Plume in Treatment: 13 Vascular Assessment Pulses: Posterior Tibial Dorsalis Pedis Palpable: [Left:Yes] Extremity colors, hair growth, and conditions: Extremity Color: [Left:Normal] Hair Growth on Extremity: [Left:Yes] Temperature of Extremity: [Left:Warm] Capillary Refill: [Left:< 3 seconds] Electronic Signature(s) Signed: 09/10/2015 5:13:21 PM By: Curtis Sites Entered By: Curtis Sites on 09/10/2015 09:26:42 Stanis, [name] ([id number]) -------------------------------------------------------------------------------- Multi Wound Chart Details Patient Name: Dominique Marker K. Date of Service: 09/10/2015 9:15 AM Medical Record Patient Account Number: [id number] Number: Treating RN: Curtis Sites 08/09/1951 (65 y.o. Other Clinician: Date of Birth/Sex: Female) Treating ROBSON, MICHAEL Primary Care Physician: Aram Beecham Physician/Extender: G Referring Physician: Bing Plume in Treatment: 13 Vital Signs Height(in):  68 Pulse(bpm): 62 Weight(lbs): 154 Blood Pressure 175/63 (mmHg): Body Mass Index(BMI): 23 Temperature(F): 98.1 Respiratory Rate 18 (breaths/min): Photos: [2:No Photos] [N/A:N/A] Wound Location: [2:Left Foot - Plantar] [N/A:N/A] Wounding Event: [2:Trauma] [N/A:N/A] Primary Etiology: [2:Diabetic Wound/Ulcer of the Lower Extremity] [N/A:N/A] Comorbid History: [2:Cataracts, Angina, Arrhythmia, Coronary Artery Disease, Hypertension, Peripheral Arterial Disease, Type II Diabetes] [N/A:N/A] Date Acquired: [2:05/21/2015] [N/A:N/A] Weeks of Treatment: [2:13] [N/A:N/A] Wound Status: [2:Open] [N/A:N/A] Measurements L x W x D 0.2x0.2x0.3 [N/A:N/A] (cm) Area (cm) : [2:0.031] [N/A:N/A] Volume (cm) : [2:0.009] [N/A:N/A] % Reduction in Area: [2:50.80%] [N/A:N/A] % Reduction in Volume: 64.00% [N/A:N/A] Classification: [2:Grade 1] [N/A:N/A] Exudate Amount: [2:Small] [N/A:N/A] Exudate Type: [2:Serous] [N/A:N/A] Exudate Color: [2:amber] [N/A:N/A] Wound Margin: [2:Flat and Intact] [N/A:N/A]  Granulation Amount: [2:Large (67-100%)] [N/A:N/A] Granulation Quality: [2:Pink] [N/A:N/A] Necrotic Amount: [2:None Present (0%)] [N/A:N/A] Exposed Structures: Fascia: No N/A N/A Fat: No Tendon: No Muscle: No Joint: No Bone: No Limited to Skin Breakdown Epithelialization: Small (1-33%) N/A N/A Periwound Skin Texture: Callus: Yes N/A N/A Edema: No Excoriation: No Induration: No Crepitus: No Fluctuance: No Friable: No Rash: No Scarring: No Periwound Skin Moist: Yes N/A N/A Moisture: Maceration: No Dry/Scaly: No Periwound Skin Color: Atrophie Blanche: No N/A N/A Cyanosis: No Ecchymosis: No Erythema: No Hemosiderin Staining: No Mottled: No Pallor: No Rubor: No Temperature: No Abnormality N/A N/A Tenderness on Yes N/A N/A Palpation: Wound Preparation: Ulcer Cleansing: N/A N/A Rinsed/Irrigated with Saline Topical Anesthetic Applied: Other: lidocaine 4% Treatment Notes Electronic  Signature(s) Signed: 09/10/2015 5:13:21 PM By: Curtis Sites Entered By: Curtis Sites on 09/10/2015 09:27:06 Auletta, [name] ([id number]) -------------------------------------------------------------------------------- Multi-Disciplinary Care Plan Details Patient Name: [name], [name]. Date of Service: 09/10/2015 9:15 AM Medical Record Patient Account Number: [id number] Number: Treating RN: Curtis Sites Aug 23, 1950 (65 y.o. Other Clinician: Date of Birth/Sex: Female) Treating ROBSON, MICHAEL Primary Care Physician: Aram Beecham Physician/Extender: G Referring Physician: Bing Plume in Treatment: 13 Active Inactive Wound/Skin Impairment Nursing Diagnoses: Impaired tissue integrity Goals: Patient/caregiver will verbalize understanding of skin care regimen Date Initiated: 06/11/2015 Goal Status: Active Ulcer/skin breakdown will have a volume reduction of 30% by week 4 Date Initiated: 06/11/2015 Goal Status: Active Ulcer/skin breakdown will have a volume reduction of 50% by week 8 Date Initiated: 06/11/2015 Goal Status: Active Ulcer/skin breakdown will have a volume reduction of 80% by week 12 Date Initiated: 06/11/2015 Goal Status: Active Ulcer/skin breakdown will heal within 14 weeks Date Initiated: 06/11/2015 Goal Status: Active Interventions: Assess patient/caregiver ability to obtain necessary supplies Assess ulceration(s) every visit Notes: Electronic Signature(s) Signed: 09/10/2015 5:13:21 PM By: Curtis Sites Entered By: Curtis Sites on 09/10/2015 09:26:56 Eddington, [name] ([id number]) -------------------------------------------------------------------------------- Patient/Caregiver Education Details Patient Name: [name]. Date of Service: 09/10/2015 9:15 AM Medical Record Patient Account Number: [id number] Number: Treating RN: Curtis Sites 11/17/1950 (65 y.o. Other Clinician: Date of Birth/Gender: Female) Treating  ROBSON, MICHAEL Primary Care Physician: Aram Beecham Physician/Extender: G Referring Physician: Bing Plume in Treatment: 13 Education Assessment Education Provided To: Patient Education Topics Provided Wound/Skin Impairment: Handouts: Other: wound care as ordered Methods: Demonstration, Explain/Verbal Responses: State content correctly Electronic Signature(s) Signed: 09/10/2015 1:28:15 PM By: Curtis Sites Entered By: Curtis Sites on 09/10/2015 13:28:14 Meeuwsen, [name] ([id number]) -------------------------------------------------------------------------------- Wound Assessment Details Patient Name: Dominique Marker K. Date of Service: 09/10/2015 9:15 AM Medical Record Patient Account Number: [id number] Number: Treating RN: Curtis Sites 1951/06/22 (65 y.o. Other Clinician: Date of Birth/Sex: Female) Treating ROBSON, MICHAEL Primary Care Physician: Aram Beecham Physician/Extender: G Referring Physician: Bing Plume in Treatment: 13 Wound Status Wound Number: 2 Primary Diabetic Wound/Ulcer of the Lower Etiology: Extremity Wound Location: Left Foot - Plantar Wound Open Wounding Event: Trauma Status: Date Acquired: 05/21/2015 Comorbid Cataracts, Angina, Arrhythmia, Weeks Of Treatment: 13 History: Coronary Artery Disease, Hypertension, Clustered Wound: No Peripheral Arterial Disease, Type II Diabetes Photos Photo Uploaded By: Curtis Sites on 09/10/2015 16:54:16 Wound Measurements Length: (cm) 0.2 Width: (cm) 0.2 Depth: (cm) 0.3 Area: (cm) 0.031 Volume: (cm) 0.009 % Reduction in Area: 50.8% % Reduction in Volume: 64% Epithelialization: Small (1-33%) Tunneling: No Undermining: No Wound Description Classification: Grade 1 Foul Odor After Wound Margin: Flat and Intact Exudate Amount: Small Exudate Type: Serous Exudate Color: amber Cleansing: No Wound Bed Granulation Amount: Large (  67-100%) Exposed Structure [name],  [name] ([id number]) Granulation Quality: Pink Fascia Exposed: No Necrotic Amount: None Present (0%) Fat Layer Exposed: No Tendon Exposed: No Muscle Exposed: No Joint Exposed: No Bone Exposed: No Limited to Skin Breakdown Periwound Skin Texture Texture Color No Abnormalities Noted: No No Abnormalities Noted: No Callus: Yes Atrophie Blanche: No Crepitus: No Cyanosis: No Excoriation: No Ecchymosis: No Fluctuance: No Erythema: No Friable: No Hemosiderin Staining: No Induration: No Mottled: No Localized Edema: No Pallor: No Rash: No Rubor: No Scarring: No Temperature / Pain Moisture Temperature: No Abnormality No Abnormalities Noted: No Tenderness on Palpation: Yes Dry / Scaly: No Maceration: No Moist: Yes Wound Preparation Ulcer Cleansing: Rinsed/Irrigated with Saline Topical Anesthetic Applied: Other: lidocaine 4%, Treatment Notes Wound #2 (Left, Plantar Foot) 1. Cleansed with: Clean wound with Normal Saline 2. Anesthetic Topical Lidocaine 4% cream to wound bed prior to debridement 4. Dressing Applied: Iodosorb Ointment Other dressing (specify in notes) 5. Secondary Dressing Applied Dry Gauze Notes darco shoe with peg assist, comfeel plus Electronic Signature(s) Signed: 09/10/2015 5:13:21 PM By: Dyann Kief, [name] ([id number]) Entered By: Curtis Sites on 09/10/2015 09:26:21 Dosh, [name] ([id number]) -------------------------------------------------------------------------------- Vitals Details Patient Name: Dominique Marker K. Date of Service: 09/10/2015 9:15 AM Medical Record Patient Account Number: [id number] Number: Treating RN: Curtis Sites [birth year] (65 y.o. Other Clinician: Date of Birth/Sex: Female) Treating ROBSON, MICHAEL Primary Care Physician: Aram Beecham Physician/Extender: G Referring Physician: Bing Plume in Treatment: 13 Vital Signs Time Taken: 09:20 Temperature (F): 98.1 Height (in):  68 Pulse (bpm): 62 Weight (lbs): 154 Respiratory Rate (breaths/min): 18 Body Mass Index (BMI): 23.4 Blood Pressure (mmHg): 175/63 Reference Range: 80 - 120 mg / dl Electronic Signature(s) Signed: 09/10/2015 5:13:21 PM By: Curtis Sites Entered By: Curtis Sites on 09/10/2015 09:21:41

## 2015-09-17 ENCOUNTER — Ambulatory Visit (INDEPENDENT_AMBULATORY_CARE_PROVIDER_SITE_OTHER): Payer: Medicare Other

## 2015-09-17 ENCOUNTER — Encounter: Payer: Medicare Other | Admitting: Internal Medicine

## 2015-09-17 DIAGNOSIS — I2699 Other pulmonary embolism without acute cor pulmonale: Secondary | ICD-10-CM

## 2015-09-17 DIAGNOSIS — I480 Paroxysmal atrial fibrillation: Secondary | ICD-10-CM | POA: Diagnosis not present

## 2015-09-17 DIAGNOSIS — Z5181 Encounter for therapeutic drug level monitoring: Secondary | ICD-10-CM

## 2015-09-17 DIAGNOSIS — E11621 Type 2 diabetes mellitus with foot ulcer: Secondary | ICD-10-CM | POA: Diagnosis not present

## 2015-09-17 LAB — POCT INR: INR: 1.9

## 2015-09-19 NOTE — Progress Notes (Signed)
Dominique, [name] ([id number]) Visit Report for 09/17/2015 Chief Complaint Document Details Patient Name: [name], [name]. Date of Service: 09/17/2015 10:45 AM Medical Record Patient Account Number: [id number] Number: Treating RN: Phillis Haggis July 22, 1951 (65 y.o. 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 Electronic Signature(s) Signed: 09/17/2015 5:51:40 PM By: Baltazar Najjar MD Entered By: Baltazar Najjar on 09/17/2015 12:03:13 Cowdrey, [name] ([id number]) -------------------------------------------------------------------------------- Debridement Details Patient Name: Dominique Marker K. Date of Service: 09/17/2015 10:45 AM Medical Record Patient Account Number: [id number] Number: Treating RN: Phillis Haggis 1951/06/17 (65 y.o. Other Clinician: Date of Birth/Sex: Female) Treating ROBSON, MICHAEL Primary Care Physician/Extender: Tawanna Solo Physician: Referring Physician: Bing Plume in Treatment: 14 Debridement Performed for Wound #2 Left,Plantar Foot Assessment: Performed By: Physician Maxwell Caul, MD Debridement: Debridement Pre-procedure Yes Verification/Time Out Taken: Start Time: 11:01 Pain Control: Other : lidocaine 4% cream Level: Skin/Subcutaneous Tissue Total Area Debrided (L x 0.3 (cm) x 0.3 (cm) = 0.09 (cm) W): Tissue and other Viable, Non-Viable, Callus, Exudate, Fibrin/Slough, Subcutaneous material debrided: Instrument: Blade Bleeding: Minimum Hemostasis Achieved: Pressure End Time: 11:05 Procedural Pain: 0 Post Procedural Pain: 0 Response to Treatment: Procedure was tolerated well Post Debridement Measurements of Total Wound Length: (cm)  0.4 Width: (cm) 0.5 Depth: (cm) 0.3 Volume: (cm) 0.047 Post Procedure Diagnosis Same as Pre-procedure Electronic Signature(s) Signed: 09/17/2015 5:51:40 PM By: Baltazar Najjar MD Signed: 09/18/2015 5:51:12 PM By: Alejandro Mulling Entered By: Baltazar Najjar on 09/17/2015 12:01:07 [name], [name] ([id number]) [name], [name] ([id number]) -------------------------------------------------------------------------------- HPI Details Patient Name: Dominique Marker K. Date of Service: 09/17/2015 10:45 AM Medical Record Patient Account Number: [id number] Number: Treating RN: Phillis Haggis [birth year] (65 y.o. Other Clinician: Date of Birth/Sex: Female) Treating ROBSON, MICHAEL Primary Care Physician/Extender: Tawanna Solo Physician: Referring Physician: Bing Plume in Treatment: 14 History of Present Illness HPI Description: Very pleasant 65 year old with past medical history significant for type 2 diabetes (hemoglobin A1c 7.3 in June 2016), peripheral neuropathy, peripheral vascular disease, and coronary artery disease. 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 She developed an ulceration in between her left third and fourth metatarsal heads in April 2014 after stepping on a piece of glass. She underwent LLE angioplasty in June 2014 by Dr. [name]. She was treated at the [facility] wound clinic with hyperbaric oxygen therapy for a deep tissue infection, which grew MRSA and did not significantly improve with antibiotics. X-rays showed no evidence for underlying osteomyelitis. She also underwent placement of Dermagraft x7. She completed a total of 60 HBO treatments at [facility] in Feb 2015. In-chamber TCOM >720mmHg. Her ulcer worsened after stopping HBO and she completed an additional 30 treatments (90 total) with significant improvement (eventually healed). She underwent angioplasty of  her left peroneal artery, tibioperoneal trunk, and superficial femoral artery on September 26, 2013. MRI on 10/12/13 showed no evidence for osteomyelitis or abscess. s/p Grafix x 5 and Epifix x 3 application with significant improvement. Significantly improved after starting to offload with knee walker, which she used at home but not in public for fear of falling. Also using darco shoe. Refused total contact cast secondary to fall history. Her ulcer was healed at her last clinic visit in Feb 2016. She returned to clinic after  stepping on a piece of plastic in October 2016. She developed a painful callus and was seen by Dr. Alberteen Spindle in podiatry. The callus was debrided, and she was found to have an underlying ulceration. X-ray reportedly showed no evidence for underlying osteomyelitis. Repeat x-ray 07/16/2015 showed no evidence for osteomyelitis. She reports moderate pain with pressure. No claudication or rest pain. ABI 0.8. She has been applying silver alginate and offloading with a knee walker when possible and a postop shoe and new orthotics. Epifix initially applied 07/30/2015 with significant improvement. She returns to clinic for follow-up and is without new complaints. Moderate pain with pressure per above. No fever or chills. Minimal bloody drainage. 09/10/15; this patient has a small wound that has roughly 3 mm in depth. There is some undermining at the base of this perhaps 2 or 3 mm. The area is painful which the patient attributes to a callous. Patient has diabetic neuropathy. We used Prisma last time. The patient has an offloading shoe. She is reluctant to try a cast due to falling/balance issues. Her original wound was in the early part of 2015 in this area. She apparently underwent hyperbaric oxygen which caused cataracts according to the patient. She was healed in 2016 in February She continues to use her knee walker/scooter at home for offloading. Dominique, [name]  ([id number]) Electronic Signature(s) Signed: 09/17/2015 5:51:40 PM By: Baltazar Najjar MD Entered By: Baltazar Najjar on 09/17/2015 12:05:52 [name] ([id number]) -------------------------------------------------------------------------------- Physical Exam Details Patient Name: TARI, LECOUNT K. Date of Service: 09/17/2015 10:45 AM Medical Record Patient Account Number: [id number] Number: Treating RN: Phillis Haggis Nov 04, 1950 (65 y.o. Other Clinician: Date of Birth/Sex: Female) Treating ROBSON, MICHAEL Primary Care Physician/Extender: Tawanna Solo Physician: Referring Physician: Bing Plume in Treatment: 14 Notes Wound exam; the patient does have callus surrounding this wound which I debridement. I also removed surface slough and some nonviable subcutaneous tissue from the surface of this small wound over her fourth metatarsal head. She continues to complain about more pain then I can easily explain with the degree of callus she has. Although she has a history of PID or dorsalis pedis pulses palpable. Post debridement the surface of this wound over the fourth metatarsal head appears better. Electronic Signature(s) Signed: 09/17/2015 5:51:40 PM By: Baltazar Najjar MD Entered By: Baltazar Najjar on 09/17/2015 12:07:47 [name] ([id number]) -------------------------------------------------------------------------------- Physician Orders Details Patient Name: DERYL, PORTS. Date of Service: 09/17/2015 10:45 AM Medical Record Patient Account Number: [id number] Number: Treating RN: Phillis Haggis [birth year] (65 y.o. Other Clinician: Date of Birth/Sex: Female) Treating ROBSON, MICHAEL Primary Care Physician/Extender: Tawanna Solo Physician: Referring Physician: Bing Plume in Treatment: 14 Verbal / Phone Orders: Yes Clinician: Ashok Cordia, Debi Read Back and Verified: Yes Diagnosis Coding Wound Cleansing Wound #2  Left,Plantar Foot o Cleanse wound with mild soap and water o May Shower, gently pat wound dry prior to applying new dressing. Anesthetic Wound #2 Left,Plantar Foot o Topical Lidocaine 4% cream applied to wound bed prior to debridement Primary Wound Dressing Wound #2 Left,Plantar Foot o Iodoflex - use iodosorb ointment at home d/t being out of iodosorb ointment here o Foam Secondary Dressing Wound #2 Left,Plantar Foot o Boardered Foam Dressing Dressing Change Frequency Wound #2 Left,Plantar Foot o Change dressing every other day. Follow-up Appointments Wound #2 Left,Plantar Foot o Return Appointment in 1 week. Off-Loading Wound #2 Left,Plantar Foot o Open toe surgical shoe with peg assist. o Other: - felt Additional Orders / Instructions Brentlinger,  Fatou K. ([id number]) Wound #2 Left,Plantar Foot o Increase protein intake. o Activity as tolerated o Other: - MVI, Vit C, Zinc Electronic Signature(s) Signed: 09/17/2015 5:51:40 PM By: Baltazar Najjar MD Signed: 09/18/2015 5:51:12 PM By: Alejandro Mulling Entered By: Alejandro Mulling on 09/17/2015 11:28:24 Schubert, [name] ([id number]) -------------------------------------------------------------------------------- Problem List Details Patient Name: PHENIX, GREIN K. Date of Service: 09/17/2015 10:45 AM Medical Record Patient Account Number: [id number] Number: Treating RN: Phillis Haggis [birth year] (65 y.o. Other Clinician: Date of Birth/Sex: Female) Treating ROBSON, MICHAEL Primary Care Physician/Extender: Tawanna Solo Physician: Referring Physician: Bing Plume in Treatment: 14 Active Problems ICD-10 Encounter Code Description Active Date Diagnosis E11.621 Type 2 diabetes mellitus with foot ulcer 06/11/2015 Yes E11.40 Type 2 diabetes mellitus with diabetic neuropathy, 06/11/2015 Yes unspecified I70.245 Atherosclerosis of native arteries of left leg with ulceration 06/11/2015  Yes of other part of foot I25.10 Atherosclerotic heart disease of native coronary artery 06/11/2015 Yes without angina pectoris Inactive Problems Resolved Problems Electronic Signature(s) Signed: 09/17/2015 5:51:40 PM By: Baltazar Najjar MD Entered By: Baltazar Najjar on 09/17/2015 12:00:44 Stare, Prescious K. ([id number]) -------------------------------------------------------------------------------- Progress Note Details Patient Name: Dominique Marker K. Date of Service: 09/17/2015 10:45 AM Medical Record Patient Account Number: [id number] Number: Treating RN: Phillis Haggis [birth year] (65 y.o. 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 History of Present Illness (HPI) Very pleasant 65 year old with past medical history significant for type 2 diabetes (hemoglobin A1c 7.3 in June 2016), peripheral neuropathy, peripheral vascular disease, and coronary artery disease. 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 She developed an ulceration in between her left third and fourth metatarsal heads in April 2014 after stepping on a piece of glass. She underwent LLE angioplasty in June 2014 by Dr. [name]. She was treated at the [facility] wound clinic with hyperbaric oxygen therapy for a deep tissue infection, which grew MRSA and did not significantly improve with antibiotics. X-rays showed no evidence for underlying osteomyelitis. She also underwent placement of Dermagraft x7. She completed a total of 60 HBO treatments at [facility] in Feb 2015. In-chamber TCOM >723mmHg. Her ulcer worsened after stopping HBO and she completed an additional 30  treatments (90 total) with significant improvement (eventually healed). She underwent angioplasty of her left peroneal artery, tibioperoneal trunk, and superficial femoral artery on September 26, 2013. MRI on 10/12/13 showed no evidence for osteomyelitis or abscess. s/p Grafix x 5 and Epifix x 3 application with significant improvement. Significantly improved after starting to offload with knee walker, which she used at home but not in public for fear of falling. Also using darco shoe. Refused total contact cast secondary to fall history. Her ulcer was healed at her last clinic visit in Feb 2016. She returned to clinic after stepping on a piece of plastic in October 2016. She developed a painful callus and was seen by Dr. Alberteen Spindle in podiatry. The callus was debrided, and she was found to have an underlying ulceration. X-ray reportedly showed no evidence for underlying osteomyelitis. Repeat x-ray 07/16/2015 showed no evidence for osteomyelitis. She reports moderate pain with pressure. No claudication or rest pain. ABI 0.8. She has been applying silver alginate and offloading with a knee walker when possible and a postop shoe and new orthotics. Epifix  initially applied 07/30/2015 with significant improvement. She returns to clinic for follow-up and is without new complaints. Moderate pain with pressure per above. No fever or chills. Minimal bloody drainage. 09/10/15; this patient has a small wound that has roughly 3 mm in depth. There is some undermining at the Kenner, Glynna K. ([id number]) base of this perhaps 2 or 3 mm. The area is painful which the patient attributes to a callous. Patient has diabetic neuropathy. We used Prisma last time. The patient has an offloading shoe. She is reluctant to try a cast due to falling/balance issues. Her original wound was in the early part of 2015 in this area. She apparently underwent hyperbaric oxygen which caused cataracts according to the patient. She was  healed in 2016 in February She continues to use her knee walker/scooter at home for offloading. Objective Constitutional Vitals Time Taken: 10:39 AM, Height: 68 in, Weight: 154 lbs, BMI: 23.4, Temperature: 98.1 F, Pulse: 62 bpm, Respiratory Rate: 20 breaths/min, Blood Pressure: 194/72 mmHg. Integumentary (Hair, Skin) Wound #2 status is Open. Original cause of wound was Trauma. The wound is located on the Left,Plantar Foot. The wound measures 0.3cm length x 0.3cm width x 0.3cm depth; 0.071cm^2 area and 0.021cm^3 volume. The wound is limited to skin breakdown. There is no tunneling or undermining noted. There is a large amount of serous drainage noted. The wound margin is flat and intact. There is large (67-100%) pink granulation within the wound bed. There is no necrotic tissue within the wound bed. The periwound skin appearance exhibited: Callus, Moist. The periwound skin appearance did not exhibit: Crepitus, Excoriation, Fluctuance, Friable, Induration, Localized Edema, Rash, Scarring, Dry/Scaly, Maceration, Atrophie Blanche, Cyanosis, Ecchymosis, Hemosiderin Staining, Mottled, Pallor, Rubor, Erythema. Periwound temperature was noted as No Abnormality. The periwound has tenderness on palpation. Assessment Active Problems ICD-10 E11.621 - Type 2 diabetes mellitus with foot ulcer E11.40 - Type 2 diabetes mellitus with diabetic neuropathy, unspecified I70.245 - Atherosclerosis of native arteries of left leg with ulceration of other part of foot I25.10 - Atherosclerotic heart disease of native coronary artery without angina pectoris Procedures Miyoshi, Liliani K. ([id number]) Wound #2 Wound #2 is a Diabetic Wound/Ulcer of the Lower Extremity located on the Left,Plantar Foot . There was a Skin/Subcutaneous Tissue Debridement ([id number]) debridement with total area of 0.09 sq cm performed by Maxwell Caul, MD. with the following instrument(s): Blade to remove Viable and Non-Viable  tissue/material including Exudate, Fibrin/Slough, Callus, and Subcutaneous after achieving pain control using Other (lidocaine 4% cream). A time out was conducted prior to the start of the procedure. A Minimum amount of bleeding was controlled with Pressure. The procedure was tolerated well with a pain level of 0 throughout and a pain level of 0 following the procedure. Post Debridement Measurements: 0.4cm length x 0.5cm width x 0.3cm depth; 0.047cm^3 volume. Post procedure Diagnosis Wound #2: Same as Pre-Procedure Plan Wound Cleansing: Wound #2 Left,Plantar Foot: Cleanse wound with mild soap and water May Shower, gently pat wound dry prior to applying new dressing. Anesthetic: Wound #2 Left,Plantar Foot: Topical Lidocaine 4% cream applied to wound bed prior to debridement Primary Wound Dressing: Wound #2 Left,Plantar Foot: Iodoflex - use iodosorb ointment at home d/t being out of iodosorb ointment here Foam Secondary Dressing: Wound #2 Left,Plantar Foot: Boardered Foam Dressing Dressing Change Frequency: Wound #2 Left,Plantar Foot: Change dressing every other day. Follow-up Appointments: Wound #2 Left,Plantar Foot: Return Appointment in 1 week. Off-Loading: Wound #2 Left,Plantar Foot: Open toe surgical shoe with peg assist. Other: -  felt Additional Orders / Instructions: Wound #2 Left,Plantar Foot: Increase protein intake. Activity as tolerated Other: - MVI, Vit C, Zinc Kean, Jacklyne K. ([id number]) #1 I would like to continue the Iodosorb ointment for now. We are covering this in a foam. she hasn't opened toed surgical shoe #2 the patient will not consider total contact casting or anything else that will put her balance off. We could consider a cam walker #3 the patient has a complex wound history in this area including a MRSA infection for which she received hyperbaric oxygen apparently in [place]. She is also had quite a few advanced treatment options. I would like  to have a chance to review all of this. #4 further in deeper debridement may be necessary here as I think there is more undermining then I can easily identified. #5 once again another MRI comes to mind Electronic Signature(s) Signed: 09/17/2015 5:51:40 PM By: Baltazar Najjar MD Entered By: Baltazar Najjar on 09/17/2015 12:11:09 Carfagno, [name] ([id number]) -------------------------------------------------------------------------------- SuperBill Details Patient Name: Dominique Marker K. Date of Service: 09/17/2015 Medical Record Patient Account Number: [id number] Number: Treating RN: Phillis Haggis 1950/12/17 (65 y.o. Other Clinician: Date of Birth/Sex: Female) Treating ROBSON, MICHAEL Primary Care Physician/Extender: Tawanna Solo Physician: Weeks in Treatment: 14 Referring Physician: Aram Beecham Diagnosis Coding ICD-10 Codes Code Description E11.621 Type 2 diabetes mellitus with foot ulcer E11.40 Type 2 diabetes mellitus with diabetic neuropathy, unspecified I70.245 Atherosclerosis of native arteries of left leg with ulceration of other part of foot I25.10 Atherosclerotic heart disease of native coronary artery without angina pectoris Facility Procedures CPT4 Code: [id number] Description: 11042 - DEB SUBQ TISSUE 20 SQ CM/< ICD-10 Description Diagnosis E11.621 Type 2 diabetes mellitus with foot ulcer Modifier: Quantity: 1 Physician Procedures CPT4 Code: [id number] Description: 11042 - WC PHYS SUBQ TISS 20 SQ CM ICD-10 Description Diagnosis E11.621 Type 2 diabetes mellitus with foot ulcer Modifier: Quantity: 1 Electronic Signature(s) Signed: 09/17/2015 5:51:40 PM By: Baltazar Najjar MD Entered By: Baltazar Najjar on 09/17/2015 12:11:30

## 2015-09-19 NOTE — Progress Notes (Signed)
KERRIN, MARKMAN ([id number]) Visit Report for 09/17/2015 Arrival Information Details Patient Name: [name], [name]. Date of Service: 09/17/2015 10:45 AM Medical Record Patient Account Number: [id number] Number: Treating RN: Phillis Haggis 11-20-1950 (65 y.o. Other Clinician: Date of Birth/Sex: Female) Treating ROBSON, MICHAEL Primary Care Physician: Aram Beecham Physician/Extender: G Referring Physician: Bing Plume in Treatment: 14 Visit Information History Since Last Visit All ordered tests and consults were completed: No Patient Arrived: Ambulatory Added or deleted any medications: No Arrival Time: 10:38 Any new allergies or adverse reactions: No Accompanied By: self Had a fall or experienced change in No Transfer Assistance: None activities of daily living that may affect Patient Requires Transmission- No risk of falls: Based Precautions: Signs or symptoms of abuse/neglect since last No Patient Has Alerts: Yes visito Patient Alerts: Patient on Blood Hospitalized since last visit: No Thinner Pain Present Now: Yes DMII warfarin Electronic Signature(s) Signed: 09/18/2015 5:51:12 PM By: Alejandro Mulling Entered By: Alejandro Mulling on 09/17/2015 10:38:50 Symonette, [name] ([id number]) -------------------------------------------------------------------------------- Encounter Discharge Information Details Patient Name: [name]. Date of Service: 09/17/2015 10:45 AM Medical Record Patient Account Number: [id number] Number: Treating RN: Phillis Haggis 04-Apr-1951 (65 y.o. Other Clinician: Date of Birth/Sex: Female) Treating ROBSON, MICHAEL Primary Care Physician: Aram Beecham Physician/Extender: G Referring Physician: Bing Plume in Treatment: 14 Encounter Discharge Information Items Discharge Pain Level: 0 Discharge Condition: Stable Ambulatory Status: Ambulatory Discharge Destination: Home Transportation: Private  Auto Accompanied By: self Schedule Follow-up Appointment: Yes Medication Reconciliation completed and provided to Patient/Care Yes Lasondra Hodgkins: Provided on Clinical Summary of Care: 09/17/2015 Form Type Recipient Paper Patient PP Electronic Signature(s) Signed: 09/18/2015 5:51:12 PM By: Alejandro Mulling Previous Signature: 09/17/2015 11:21:18 AM Version By: Gwenlyn Perking Entered By: Alejandro Mulling on 09/17/2015 11:22:05 [name], [name] ([id number]) -------------------------------------------------------------------------------- Lower Extremity Assessment Details Patient Name: Dominique Marker K. Date of Service: 09/17/2015 10:45 AM Medical Record Patient Account Number: [id number] Number: Treating RN: Phillis Haggis 1951/05/28 (65 y.o. Other Clinician: Date of Birth/Sex: Female) Treating ROBSON, MICHAEL Primary Care Physician: Aram Beecham Physician/Extender: G Referring Physician: Bing Plume in Treatment: 14 Vascular Assessment Pulses: Posterior Tibial Dorsalis Pedis Palpable: [Left:Yes] Extremity colors, hair growth, and conditions: Extremity Color: [Left:Normal] Temperature of Extremity: [Left:Warm] Capillary Refill: [Left:< 3 seconds] Toe Nail Assessment Left: Right: Thick: No Discolored: No Deformed: No Improper Length and Hygiene: No Electronic Signature(s) Signed: 09/18/2015 5:51:12 PM By: Alejandro Mulling Entered By: Alejandro Mulling on 09/17/2015 10:49:21 [name], [name] ([id number]) -------------------------------------------------------------------------------- Multi Wound Chart Details Patient Name: Dominique Marker K. Date of Service: 09/17/2015 10:45 AM Medical Record Patient Account Number: [id number] Number: Treating RN: Phillis Haggis 1951-03-16 (65 y.o. Other Clinician: Date of Birth/Sex: Female) Treating ROBSON, MICHAEL Primary Care Physician: Aram Beecham Physician/Extender: G Referring Physician: Bing Plume in Treatment: 14 Vital Signs Height(in): 68 Pulse(bpm): 62 Weight(lbs): 154 Blood Pressure 194/72 (mmHg): Body Mass Index(BMI): 23 Temperature(F): 98.1 Respiratory Rate 20 (breaths/min): Photos: [2:No Photos] [N/A:N/A] Wound Location: [2:Left Foot - Plantar] [N/A:N/A] Wounding Event: [2:Trauma] [N/A:N/A] Primary Etiology: [2:Diabetic Wound/Ulcer of the Lower Extremity] [N/A:N/A] Comorbid History: [2:Cataracts, Angina, Arrhythmia, Coronary Artery Disease, Hypertension, Peripheral Arterial Disease, Type II Diabetes] [N/A:N/A] Date Acquired: [2:05/21/2015] [N/A:N/A] Weeks of Treatment: [2:14] [N/A:N/A] Wound Status: [2:Open] [N/A:N/A] Measurements L x W x D 0.3x0.3x0.3 [N/A:N/A] (cm) Area (cm) : [2:0.071] [N/A:N/A] Volume (cm) : [2:0.021] [N/A:N/A] % Reduction in Area: [2:-12.70%] [N/A:N/A] % Reduction in Volume: 16.00% [N/A:N/A] Classification: [2:Grade 1] [N/A:N/A] Exudate Amount: [2:Large] [N/A:N/A] Exudate Type: [2:Serous] [N/A:N/A]  Exudate Color: [2:amber] [N/A:N/A] Wound Margin: [2:Flat and Intact] [N/A:N/A] Granulation Amount: [2:Large (67-100%)] [N/A:N/A] Granulation Quality: [2:Pink] [N/A:N/A] Necrotic Amount: [2:None Present (0%)] [N/A:N/A] Exposed Structures: Fascia: No N/A N/A Fat: No Tendon: No Muscle: No Joint: No Bone: No Limited to Skin Breakdown Epithelialization: None N/A N/A Periwound Skin Texture: Callus: Yes N/A N/A Edema: No Excoriation: No Induration: No Crepitus: No Fluctuance: No Friable: No Rash: No Scarring: No Periwound Skin Moist: Yes N/A N/A Moisture: Maceration: No Dry/Scaly: No Periwound Skin Color: Atrophie Blanche: No N/A N/A Cyanosis: No Ecchymosis: No Erythema: No Hemosiderin Staining: No Mottled: No Pallor: No Rubor: No Temperature: No Abnormality N/A N/A Tenderness on Yes N/A N/A Palpation: Wound Preparation: Ulcer Cleansing: N/A N/A Rinsed/Irrigated with Saline Topical Anesthetic Applied:  Other: lidocaine 4% Treatment Notes Electronic Signature(s) Signed: 09/18/2015 5:51:12 PM By: Alejandro Mulling Entered By: Alejandro Mulling on 09/17/2015 10:58:00 [name] ([id number]) -------------------------------------------------------------------------------- Multi-Disciplinary Care Plan Details Patient Name: [name], [name]. Date of Service: 09/17/2015 10:45 AM Medical Record Patient Account Number: [id number] Number: Treating RN: Phillis Haggis October 13, 1950 (65 y.o. Other Clinician: Date of Birth/Sex: Female) Treating ROBSON, MICHAEL Primary Care Physician: Aram Beecham Physician/Extender: G Referring Physician: Bing Plume in Treatment: 14 Active Inactive Wound/Skin Impairment Nursing Diagnoses: Impaired tissue integrity Goals: Patient/caregiver will verbalize understanding of skin care regimen Date Initiated: 06/11/2015 Goal Status: Active Ulcer/skin breakdown will have a volume reduction of 30% by week 4 Date Initiated: 06/11/2015 Goal Status: Active Ulcer/skin breakdown will have a volume reduction of 50% by week 8 Date Initiated: 06/11/2015 Goal Status: Active Ulcer/skin breakdown will have a volume reduction of 80% by week 12 Date Initiated: 06/11/2015 Goal Status: Active Ulcer/skin breakdown will heal within 14 weeks Date Initiated: 06/11/2015 Goal Status: Active Interventions: Assess patient/caregiver ability to obtain necessary supplies Assess ulceration(s) every visit Notes: Electronic Signature(s) Signed: 09/18/2015 5:51:12 PM By: Alejandro Mulling Entered By: Alejandro Mulling on 09/17/2015 10:52:50 [name], [name] ([id number]) -------------------------------------------------------------------------------- Pain Assessment Details Patient Name: Dominique Marker K. Date of Service: 09/17/2015 10:45 AM Medical Record Patient Account Number: [id number] Number: Treating RN: Phillis Haggis 12-07-1950 (65 y.o. Other  Clinician: Date of Birth/Sex: Female) Treating ROBSON, MICHAEL Primary Care Physician: Aram Beecham Physician/Extender: G Referring Physician: Bing Plume in Treatment: 14 Active Problems Location of Pain Severity and Description of Pain Patient Has Paino Yes Site Locations Pain Location: Pain in Ulcers With Dressing Change: Yes Duration of the Pain. Constant / Intermittento Constant Rate the pain. Current Pain Level: 9 Character of Pain Describe the Pain: Throbbing Pain Management and Medication Current Pain Management: Electronic Signature(s) Signed: 09/18/2015 5:51:12 PM By: Alejandro Mulling Entered By: Alejandro Mulling on 09/17/2015 10:39:16 [name], [name] ([id number]) -------------------------------------------------------------------------------- Patient/Caregiver Education Details Patient Name: [name]. Date of Service: 09/17/2015 10:45 AM Medical Record Patient Account Number: [id number] Number: Treating RN: Phillis Haggis 06/18/1951 (65 y.o. Other Clinician: Date of Birth/Gender: Female) Treating ROBSON, MICHAEL Primary Care Physician: Aram Beecham Physician/Extender: G Referring Physician: Bing Plume in Treatment: 14 Education Assessment Education Provided To: Patient Education Topics Provided Wound/Skin Impairment: Handouts: Other: change dressing as ordered Methods: Demonstration, Explain/Verbal Responses: State content correctly Electronic Signature(s) Signed: 09/18/2015 5:51:12 PM By: Alejandro Mulling Entered By: Alejandro Mulling on 09/17/2015 11:22:23 [name], [name] ([id number]) -------------------------------------------------------------------------------- Wound Assessment Details Patient Name: Dominique Marker K. Date of Service: 09/17/2015 10:45 AM Medical Record Patient Account Number: [id number] Number: Treating RN: Phillis Haggis [birth year] (65 y.o. Other Clinician: Date of  Birth/Sex: Female) Treating ROBSON,  MICHAEL Primary Care Physician: Aram Beecham Physician/Extender: G Referring Physician: Bing Plume in Treatment: 14 Wound Status Wound Number: 2 Primary Diabetic Wound/Ulcer of the Lower Etiology: Extremity Wound Location: Left Foot - Plantar Wound Open Wounding Event: Trauma Status: Date Acquired: 05/21/2015 Comorbid Cataracts, Angina, Arrhythmia, Weeks Of Treatment: 14 History: Coronary Artery Disease, Hypertension, Clustered Wound: No Peripheral Arterial Disease, Type II Diabetes Photos Photo Uploaded By: Alejandro Mulling on 09/17/2015 17:31:06 Wound Measurements Length: (cm) 0.3 Width: (cm) 0.3 Depth: (cm) 0.3 Area: (cm) 0.071 Volume: (cm) 0.021 % Reduction in Area: -12.7% % Reduction in Volume: 16% Epithelialization: None Tunneling: No Undermining: No Wound Description Classification: Grade 1 Foul Odor After Wound Margin: Flat and Intact Exudate Amount: Large Exudate Type: Serous Exudate Color: amber Cleansing: No Wound Bed Granulation Amount: Large (67-100%) Exposed Structure [name], [name] ([id number]) Granulation Quality: Pink Fascia Exposed: No Necrotic Amount: None Present (0%) Fat Layer Exposed: No Tendon Exposed: No Muscle Exposed: No Joint Exposed: No Bone Exposed: No Limited to Skin Breakdown Periwound Skin Texture Texture Color No Abnormalities Noted: No No Abnormalities Noted: No Callus: Yes Atrophie Blanche: No Crepitus: No Cyanosis: No Excoriation: No Ecchymosis: No Fluctuance: No Erythema: No Friable: No Hemosiderin Staining: No Induration: No Mottled: No Localized Edema: No Pallor: No Rash: No Rubor: No Scarring: No Temperature / Pain Moisture Temperature: No Abnormality No Abnormalities Noted: No Tenderness on Palpation: Yes Dry / Scaly: No Maceration: No Moist: Yes Wound Preparation Ulcer Cleansing: Rinsed/Irrigated with Saline Topical Anesthetic  Applied: Other: lidocaine 4%, Treatment Notes Wound #2 (Left, Plantar Foot) 1. Cleansed with: Clean wound with Normal Saline 2. Anesthetic Topical Lidocaine 4% cream to wound bed prior to debridement 4. Dressing Applied: Foam Iodoflex 5. Secondary Dressing Applied Bordered Foam Dressing Notes darco shoe with peg assist Electronic Signature(s) Signed: 09/18/2015 5:51:12 PM By: Cline Crock, [name] ([id number]) Entered By: Alejandro Mulling on 09/17/2015 10:51:18 [name], [name] ([id number]) -------------------------------------------------------------------------------- Vitals Details Patient Name: Dominique Marker K. Date of Service: 09/17/2015 10:45 AM Medical Record Patient Account Number: [id number] Number: Treating RN: Phillis Haggis 01-19-1951 (65 y.o. Other Clinician: Date of Birth/Sex: Female) Treating ROBSON, MICHAEL Primary Care Physician: Aram Beecham Physician/Extender: G Referring Physician: Bing Plume in Treatment: 14 Vital Signs Time Taken: 10:39 Temperature (F): 98.1 Height (in): 68 Pulse (bpm): 62 Weight (lbs): 154 Respiratory Rate (breaths/min): 20 Body Mass Index (BMI): 23.4 Blood Pressure (mmHg): 194/72 Reference Range: 80 - 120 mg / dl Electronic Signature(s) Signed: 09/18/2015 5:51:12 PM By: Alejandro Mulling Entered By: Alejandro Mulling on 09/17/2015 10:48:57

## 2015-09-24 ENCOUNTER — Encounter: Payer: Medicare Other | Admitting: Internal Medicine

## 2015-09-24 DIAGNOSIS — E11621 Type 2 diabetes mellitus with foot ulcer: Secondary | ICD-10-CM | POA: Diagnosis not present

## 2015-09-25 NOTE — Progress Notes (Addendum)
JAGGER, BEAHM ([id number]) Visit Report for 09/24/2015 Arrival Information Details Patient Name: [name], [name]. Date of Service: 09/24/2015 10:45 AM Medical Record Patient Account Number: [id number] Number: Treating RN: Clover Mealy, RN, BSN, Rita Sep 29, 1950 [phone] y.o. Other Clinician: Date of Birth/Sex: Female) Treating ROBSON, MICHAEL Primary Care Physician: Aram Beecham Physician/Extender: G Referring Physician: Bing Plume in Treatment: 15 Visit Information History Since Last Visit Added or deleted any medications: No Patient Arrived: Ambulatory Any new allergies or adverse reactions: No Arrival Time: 10:34 Had a fall or experienced change in No Accompanied By: self activities of daily living that may affect Transfer Assistance: None risk of falls: Patient Identification Verified: Yes Signs or symptoms of abuse/neglect since last No Secondary Verification Process Yes visito Completed: Hospitalized since last visit: No Patient Requires Transmission- No Has Dressing in Place as Prescribed: Yes Based Precautions: Has Footwear/Offloading in Place as No Patient Has Alerts: Yes Prescribed: Patient Alerts: Patient on Blood Left: Wedge Thinner Shoe DMII Pain Present Now: No warfarin Electronic Signature(s) Signed: 09/24/2015 5:09:16 PM By: Elpidio Eric BSN, RN Entered By: Elpidio Eric on 09/24/2015 10:35:20 [place], [name] ([id number]) -------------------------------------------------------------------------------- Encounter Discharge Information Details Patient Name: Dominique Marker K. Date of Service: 09/24/2015 10:45 AM Medical Record Patient Account Number: [id number] Number: Treating RN: Clover Mealy, RN, BSN, Rita [birth year] [phone] y.o. Other Clinician: Date of Birth/Sex: Female) Treating ROBSON, MICHAEL Primary Care Physician: Aram Beecham Physician/Extender: G Referring Physician: Bing Plume in Treatment: 15 Encounter  Discharge Information Items Discharge Pain Level: 0 Discharge Condition: Stable Ambulatory Status: Ambulatory Discharge Destination: Home Transportation: Private Auto Accompanied By: self Schedule Follow-up Appointment: No Medication Reconciliation completed and provided to Patient/Care No Krystian Ferrentino: Provided on Clinical Summary of Care: 09/24/2015 Form Type Recipient Paper Patient PP Electronic Signature(s) Signed: 09/24/2015 11:19:27 AM By: Gwenlyn Perking Previous Signature: 09/24/2015 10:55:45 AM Version By: Elpidio Eric BSN, RN Entered By: Gwenlyn Perking on 09/24/2015 11:19:27 Schlender, [name] ([id number]) -------------------------------------------------------------------------------- Lower Extremity Assessment Details Patient Name: Dominique Marker K. Date of Service: 09/24/2015 10:45 AM Medical Record Patient Account Number: [id number] Number: Treating RN: Clover Mealy, RN, BSN, Rita Aug 30, 1950 [phone] y.o. Other Clinician: Date of Birth/Sex: Female) Treating ROBSON, MICHAEL Primary Care Physician: Aram Beecham Physician/Extender: G Referring Physician: Bing Plume in Treatment: 15 Vascular Assessment Pulses: Posterior Tibial Dorsalis Pedis Palpable: [Left:Yes] Extremity colors, hair growth, and conditions: Extremity Color: [Left:Normal] Hair Growth on Extremity: [Left:No] Temperature of Extremity: [Left:Warm] Capillary Refill: [Left:< 3 seconds] Toe Nail Assessment Left: Right: Thick: No Discolored: No Deformed: No Improper Length and Hygiene: No Electronic Signature(s) Signed: 09/24/2015 5:09:16 PM By: Elpidio Eric BSN, RN Entered By: Elpidio Eric on 09/24/2015 10:40:41 [name], [name] ([id number]) -------------------------------------------------------------------------------- Multi Wound Chart Details Patient Name: Dominique Marker K. Date of Service: 09/24/2015 10:45 AM Medical Record Patient Account Number: [id number] Number: Treating  RN: Clover Mealy, RN, BSN, Rita Jan 04, 1951 [phone] y.o. Other Clinician: Date of Birth/Sex: Female) Treating ROBSON, MICHAEL Primary Care Physician: Aram Beecham Physician/Extender: G Referring Physician: Bing Plume in Treatment: 15 Photos: [2:No Photos] [N/A:N/A] Wound Location: [2:Left Foot - Plantar] [N/A:N/A] Wounding Event: [2:Trauma] [N/A:N/A] Primary Etiology: [2:Diabetic Wound/Ulcer of the Lower Extremity] [N/A:N/A] Comorbid History: [2:Cataracts, Angina, Arrhythmia, Coronary Artery Disease, Hypertension, Peripheral Arterial Disease, Type II Diabetes] [N/A:N/A] Date Acquired: [2:05/21/2015] [N/A:N/A] Weeks of Treatment: [2:15] [N/A:N/A] Wound Status: [2:Open] [N/A:N/A] Measurements L x W x D 0.4x0.4x0.4 [N/A:N/A] (cm) Area (cm) : [2:0.126] [N/A:N/A] Volume (cm) : [2:0.05] [N/A:N/A] % Reduction in Area: [2:-100.00%] [N/A:N/A] % Reduction in  Volume: -100.00% [N/A:N/A] Classification: [2:Grade 1] [N/A:N/A] Exudate Amount: [2:Medium] [N/A:N/A] Exudate Type: [2:Serous] [N/A:N/A] Exudate Color: [2:amber] [N/A:N/A] Wound Margin: [2:Flat and Intact] [N/A:N/A] Granulation Amount: [2:Large (67-100%)] [N/A:N/A] Granulation Quality: [2:Pink] [N/A:N/A] Necrotic Amount: [2:None Present (0%)] [N/A:N/A] Exposed Structures: [2:Fascia: No Fat: No Tendon: No Muscle: No Joint: No Bone: No Limited to Skin Breakdown] [N/A:N/A] Epithelialization: None N/A N/A Periwound Skin Texture: Callus: Yes N/A N/A Edema: No Excoriation: No Induration: No Crepitus: No Fluctuance: No Friable: No Rash: No Scarring: No Periwound Skin Moist: Yes N/A N/A Moisture: Maceration: No Dry/Scaly: No Periwound Skin Color: Atrophie Blanche: No N/A N/A Cyanosis: No Ecchymosis: No Erythema: No Hemosiderin Staining: No Mottled: No Pallor: No Rubor: No Temperature: No Abnormality N/A N/A Tenderness on Yes N/A N/A Palpation: Wound Preparation: Ulcer Cleansing: N/A N/A Rinsed/Irrigated  with Saline Topical Anesthetic Applied: Other: lidocaine 4% Treatment Notes Electronic Signature(s) Signed: 09/24/2015 5:09:16 PM By: Elpidio Eric BSN, RN Entered By: Elpidio Eric on 09/24/2015 10:40:57 [name] ([id number]) -------------------------------------------------------------------------------- Multi-Disciplinary Care Plan Details Patient Name: ASLAN, MONTAGNA. Date of Service: 09/24/2015 10:45 AM Medical Record Patient Account Number: [id number] Number: Treating RN: Clover Mealy, RN, BSN, Rita 1951/01/12 [phone] y.o. Other Clinician: Date of Birth/Sex: Female) Treating ROBSON, MICHAEL Primary Care Physician: Aram Beecham Physician/Extender: G Referring Physician: Bing Plume in Treatment: 15 Active Inactive Electronic Signature(s) Signed: 11/07/2015 2:15:51 PM By: Elpidio Eric BSN, RN Previous Signature: 09/24/2015 5:09:16 PM Version By: Elpidio Eric BSN, RN Entered By: Elpidio Eric on 11/07/2015 14:15:51 [name], [name] ([id number]) -------------------------------------------------------------------------------- Pain Assessment Details Patient Name: Dominique Marker K. Date of Service: 09/24/2015 10:45 AM Medical Record Patient Account Number: [id number] Number: Treating RN: Clover Mealy, RN, BSN, Rita Dec 27, 1950 [phone] y.o. Other Clinician: Date of Birth/Sex: Female) Treating ROBSON, MICHAEL Primary Care Physician: Aram Beecham Physician/Extender: G Referring Physician: Bing Plume in Treatment: 15 Active Problems Location of Pain Severity and Description of Pain Patient Has Paino No Site Locations Pain Management and Medication Current Pain Management: Electronic Signature(s) Signed: 09/24/2015 5:09:16 PM By: Elpidio Eric BSN, RN Entered By: Elpidio Eric on 09/24/2015 10:35:27 [name], [name] ([id number]) -------------------------------------------------------------------------------- Patient/Caregiver Education Details Patient  Name: [name]. Date of Service: 09/24/2015 10:45 AM Medical Record Patient Account Number: [id number] Number: Treating RN: Clover Mealy, RN, BSN, Rita 11/30/1950 [phone] y.o. Other Clinician: Date of Birth/Gender: Female) Treating ROBSON, MICHAEL Primary Care Physician: Aram Beecham Physician/Extender: G Referring Physician: Bing Plume in Treatment: 15 Education Assessment Education Provided To: Patient Education Topics Provided Wound Debridement: Methods: Explain/Verbal Responses: State content correctly Wound/Skin Impairment: Methods: Explain/Verbal Responses: State content correctly Electronic Signature(s) Signed: 09/24/2015 10:56:04 AM By: Elpidio Eric BSN, RN Entered By: Elpidio Eric on 09/24/2015 10:56:04 [name], [name] ([id number]) -------------------------------------------------------------------------------- Wound Assessment Details Patient Name: Dominique Marker K. Date of Service: 09/24/2015 10:45 AM Medical Record Patient Account Number: [id number] Number: Treating RN: Clover Mealy, RN, BSN, Rita May 28, 1951 [phone] y.o. Other Clinician: Date of Birth/Sex: Female) Treating ROBSON, MICHAEL Primary Care Physician: Aram Beecham Physician/Extender: G Referring Physician: Bing Plume in Treatment: 15 Wound Status Wound Number: 2 Primary Diabetic Wound/Ulcer of the Lower Etiology: Extremity Wound Location: Left Foot - Plantar Wound Open Wounding Event: Trauma Status: Date Acquired: 05/21/2015 Comorbid Cataracts, Angina, Arrhythmia, Weeks Of Treatment: 15 History: Coronary Artery Disease, Hypertension, Clustered Wound: No Peripheral Arterial Disease, Type II Diabetes Photos Photo Uploaded By: Elpidio Eric on 09/24/2015 14:35:05 Wound Measurements Length: (cm) 0.4 Width: (cm) 0.4 Depth: (cm) 0.4 Area: (cm) 0.126 Volume: (cm) 0.05 %  Reduction in Area: -100% % Reduction in Volume: -100% Epithelialization: None Tunneling:  No Undermining: No Wound Description Classification: Grade 1 Foul Odor Aft Wound Margin: Flat and Intact Exudate Amount: Medium Exudate Type: Serous Exudate Color: amber er Cleansing: No Wound Bed Granulation Amount: Large (67-100%) Exposed Structure [name], [name] ([id number]) Granulation Quality: Pink Fascia Exposed: No Necrotic Amount: None Present (0%) Fat Layer Exposed: No Tendon Exposed: No Muscle Exposed: No Joint Exposed: No Bone Exposed: No Limited to Skin Breakdown Periwound Skin Texture Texture Color No Abnormalities Noted: No No Abnormalities Noted: No Callus: Yes Atrophie Blanche: No Crepitus: No Cyanosis: No Excoriation: No Ecchymosis: No Fluctuance: No Erythema: No Friable: No Hemosiderin Staining: No Induration: No Mottled: No Localized Edema: No Pallor: No Rash: No Rubor: No Scarring: No Temperature / Pain Moisture Temperature: No Abnormality No Abnormalities Noted: No Tenderness on Palpation: Yes Dry / Scaly: No Maceration: No Moist: Yes Wound Preparation Ulcer Cleansing: Rinsed/Irrigated with Saline Topical Anesthetic Applied: Other: lidocaine 4%, Electronic Signature(s) Signed: 09/24/2015 5:09:16 PM By: Elpidio Eric BSN, RN Entered By: Elpidio Eric on 09/24/2015 10:40:12 [name], [name] ([id number]) -------------------------------------------------------------------------------- Vitals Details Patient Name: Dominique Marker K. Date of Service: 09/24/2015 10:45 AM Medical Record Patient Account Number: [id number] Number: Treating RN: Clover Mealy, RN, BSN, Rita August 29, 1950 [phone] y.o. Other Clinician: Date of Birth/Sex: Female) Treating ROBSON, MICHAEL Primary Care Physician: Aram Beecham Physician/Extender: G Referring Physician: Bing Plume in Treatment: 15 Vital Signs Time Taken: 10:35 Temperature (F): 97.8 Height (in): 68 Pulse (bpm): 64 Weight (lbs): 154 Respiratory Rate (breaths/min): 16 Body Mass Index  (BMI): 23.4 Blood Pressure (mmHg): 186/64 Reference Range: 80 - 120 mg / dl Electronic Signature(s) Signed: 09/24/2015 5:09:16 PM By: Elpidio Eric BSN, RN Entered By: Elpidio Eric on 09/24/2015 10:42:03

## 2015-09-25 NOTE — Progress Notes (Signed)
ASLYN, COTTMAN ([id number]) Visit Report for 09/24/2015 Chief Complaint Document Details Patient Name: Dominique, [name]. Date of Service: 09/24/2015 10:45 AM Medical Record Patient Account Number: [id number] Number: Treating RN: Clover Mealy, RN, BSN, Rita [birth year] [phone] y.o. 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 Electronic Signature(s) Signed: 09/24/2015 5:04:49 PM By: Baltazar Najjar MD Entered By: Baltazar Najjar on 09/24/2015 12:51:30 [name] ([id number]) -------------------------------------------------------------------------------- Debridement Details Patient Name: Dominique Marker K. Date of Service: 09/24/2015 10:45 AM Medical Record Patient Account Number: [id number] Number: Treating RN: Clover Mealy, RN, BSN, Rita [birth year] [phone] y.o. Other Clinician: Date of Birth/Sex: Female) Treating Wendie Diskin Primary Care Physician/Extender: Tawanna Solo Physician: Referring Physician: Bing Plume in Treatment: 15 Debridement Performed for Wound #2 Left,Plantar Foot Assessment: Performed By: Physician Maxwell Caul, MD Debridement: Debridement Pre-procedure Yes Verification/Time Out Taken: Start Time: 11:02 Pain Control: Lidocaine 4% Topical Solution Level: Skin/Subcutaneous Tissue Total Area Debrided (L x 0.4 (cm) x 0.4 (cm) = 0.16 (cm) W): Tissue and other Non-Viable, Callus, Fibrin/Slough, Subcutaneous material debrided: Instrument: Blade, Forceps Bleeding: Moderate Hemostasis Achieved: Silver Nitrate End Time: 11:07 Procedural Pain: 3 Post Procedural Pain: 3 Response to Treatment: Procedure was tolerated well Post Debridement Measurements of Total Wound Length:  (cm) 1 Width: (cm) 1 Depth: (cm) 0.2 Volume: (cm) 0.157 Post Procedure Diagnosis Same as Pre-procedure Electronic Signature(s) Signed: 09/24/2015 5:04:49 PM By: Baltazar Najjar MD Signed: 09/24/2015 5:09:16 PM By: Elpidio Eric BSN, RN Entered By: Baltazar Najjar on 09/24/2015 12:49:49 [name] ([id number]) [name], [name] ([id number]) -------------------------------------------------------------------------------- HPI Details Patient Name: [name], [name] Date of Service: 09/24/2015 10:45 AM Medical Record Patient Account Number: [id number] Number: Treating RN: Clover Mealy, RN, BSN, Rita 1951/07/05 [phone] y.o. Other Clinician: Date of Birth/Sex: Female) Treating Gola Bribiesca Primary Care Physician/Extender: Tawanna Solo Physician: Referring Physician: Bing Plume in Treatment: 15 History of Present Illness HPI Description: Very pleasant 65 year old with past medical history significant for type 2 diabetes (hemoglobin A1c 7.3 in June 2016), peripheral neuropathy, peripheral vascular disease, and coronary artery disease. 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 She developed an ulceration in between her left third and fourth metatarsal heads in April 2014 after stepping on a piece of glass. She underwent LLE angioplasty in June 2014 by Dr. [name]. She was treated at the [facility] wound clinic with hyperbaric oxygen therapy for a deep tissue infection, which grew MRSA and did not significantly improve with antibiotics. X-rays showed no evidence for underlying osteomyelitis. She also underwent placement of Dermagraft x7. She completed a total of 60 HBO treatments at [facility] in Feb 2015. In-chamber TCOM >740mmHg. Her ulcer worsened after stopping HBO and she completed an additional 30 treatments (90 total) with significant improvement (eventually healed). She underwent  angioplasty of her left peroneal artery, tibioperoneal trunk, and superficial femoral artery on September 26, 2013. MRI on 10/12/13 showed no evidence for osteomyelitis or abscess. s/p Grafix x 5 and Epifix x 3 application with significant improvement. Significantly improved after starting to offload with knee walker, which she used at home but not in public for fear of falling. Also using darco shoe. Refused total contact cast secondary to fall history. Her ulcer was healed at her last clinic visit in  Feb 2016. She returned to clinic after stepping on a piece of plastic in October 2016. She developed a painful callus and was seen by Dr. Alberteen Spindle in podiatry. The callus was debrided, and she was found to have an underlying ulceration. X-ray reportedly showed no evidence for underlying osteomyelitis. Repeat x-ray 07/16/2015 showed no evidence for osteomyelitis. She reports moderate pain with pressure. No claudication or rest pain. ABI 0.8. She has been applying silver alginate and offloading with a knee walker when possible and a postop shoe and new orthotics. Epifix initially applied 07/30/2015 with significant improvement. She returns to clinic for follow-up and is without new complaints. Moderate pain with pressure per above. No fever or chills. Minimal bloody drainage. 09/10/15; this patient has a small wound that has roughly 3 mm in depth. There is some undermining at the base of this perhaps 2 or 3 mm. The area is painful which the patient attributes to a callous. Patient has diabetic neuropathy. We used Prisma last time. The patient has an offloading shoe. She is reluctant to try a cast due to falling/balance issues. Her original wound was in the early part of 2015 in this area. She apparently underwent hyperbaric oxygen which caused cataracts according to the patient. She was healed in 2016 in February She continues to use her knee walker/scooter at home for offloading. 09/24/15; we've been using  Iodosorb on this for the last 2 weeks without any real improvement. Again in a [name], [name] ([id number]) aggressive debridement today to remove callus nonviable subcutaneous tissue. The wound is deep but not grossly infected. She arrives today asking for Epifix to to be reapplied. I see that this was applied in December I'm not exactly sure why this was stopped. 2 x-rays have not shown osteomyelitis. She has a history of osteomyelitis however and I wonder whether an ABI is going to be necessary. She does also have a history of diabetic PAD whenever her ABI year is 0.8. I don't think that this is a primary arterial issue Electronic Signature(s) Signed: 09/24/2015 5:04:49 PM By: Baltazar Najjar MD Entered By: Baltazar Najjar on 09/24/2015 12:56:02 Perdue, [name] ([id number]) -------------------------------------------------------------------------------- Physical Exam Details Patient Name: Dominique Marker K. Date of Service: 09/24/2015 10:45 AM Medical Record Patient Account Number: [id number] Number: Treating RN: Clover Mealy, RN, BSN, Rita 1950-12-04 [phone] y.o. Other Clinician: Date of Birth/Sex: Female) Treating Sarena Jezek Primary Care Physician/Extender: Tawanna Solo Physician: Referring Physician: Bing Plume in Treatment: 15 Notes Wound exam; once again the area is a small hole over the fourth metatarsal head however with extensive debridement it opens up more broadly. There is probably some undermining at the base of this. I don't think this is down to bone but it is certainly close. There is no evidence of infection. Her PAD is palpable. Electronic Signature(s) Signed: 09/24/2015 5:04:49 PM By: Baltazar Najjar MD Entered By: Baltazar Najjar on 09/24/2015 12:56:58 [name] ([id number]) -------------------------------------------------------------------------------- Physician Orders Details Patient Name: CATERINA, RACINE. Date of Service:  09/24/2015 10:45 AM Medical Record Patient Account Number: [id number] Number: Treating RN: Clover Mealy, RN, BSN, Rita [birth year] [phone] y.o. Other Clinician: Date of Birth/Sex: Female) Treating Yavuz Kirby Primary Care Physician/Extender: Tawanna Solo Physician: Referring Physician: Bing Plume in Treatment: 15 Verbal / Phone Orders: Yes Clinician: Afful, RN, BSN, Rita Read Back and Verified: Yes Diagnosis Coding Wound Cleansing Wound #2 Left,Plantar Foot o Cleanse wound with mild soap and water o May Shower, gently pat wound dry prior to  applying new dressing. Anesthetic Wound #2 Left,Plantar Foot o Topical Lidocaine 4% cream applied to wound bed prior to debridement Primary Wound Dressing Wound #2 Left,Plantar Foot o Iodoflex - use iodosorb ointment at home d/t being out of iodosorb ointment here o Foam Secondary Dressing Wound #2 Left,Plantar Foot o Boardered Foam Dressing Dressing Change Frequency Wound #2 Left,Plantar Foot o Change dressing every other day. Follow-up Appointments Wound #2 Left,Plantar Foot o Return Appointment in 1 week. Off-Loading Wound #2 Left,Plantar Foot o Open toe surgical shoe with peg assist. o Other: - felt Additional Orders / Instructions Iannuzzi, Volanda K. ([id number]) Wound #2 Left,Plantar Foot o Increase protein intake. o Activity as tolerated o Other: - MVI, Vit C, Zinc Notes Order epifix for next week Electronic Signature(s) Signed: 09/24/2015 5:04:49 PM By: Baltazar Najjar MD Signed: 09/24/2015 5:09:16 PM By: Elpidio Eric BSN, RN Entered By: Elpidio Eric on 09/24/2015 11:18:56 [name], [name] ([id number]) -------------------------------------------------------------------------------- Problem List Details Patient Name: [name], [name] Date of Service: 09/24/2015 10:45 AM Medical Record Patient Account Number: [id number] Number: Treating RN: Clover Mealy, RN, BSN, Rita 12/19/1950  [phone] y.o. 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 ([id number]) -------------------------------------------------------------------------------- Progress Note Details Patient Name: Dominique Marker K. Date of Service: 09/24/2015 10:45 AM Medical Record Patient Account Number: [id number] Number: Treating RN: Clover Mealy, RN, BSN, Rita 06/23/1951 [phone] y.o. 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 History of Present Illness (HPI) Very pleasant 65 year old with past medical history significant for type 2 diabetes (hemoglobin A1c 7.3 in June 2016), peripheral neuropathy, peripheral vascular disease, and coronary artery disease. 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 She developed an ulceration in between her left third and fourth metatarsal heads in April 2014 after stepping on a  piece of glass. She underwent LLE angioplasty in June 2014 by Dr. [name]. She was treated at the [facility] wound clinic with hyperbaric oxygen therapy for a deep tissue infection, which grew MRSA and did not significantly improve with antibiotics. X-rays showed no evidence for underlying osteomyelitis. She also underwent placement of Dermagraft x7. She completed a total of 60 HBO treatments at [facility] in Feb 2015. In-chamber TCOM >751mmHg. Her ulcer worsened after stopping HBO and she completed an additional 30 treatments (90 total) with significant improvement (eventually healed). She underwent angioplasty of her left peroneal artery, tibioperoneal trunk, and superficial femoral artery on September 26, 2013. MRI on 10/12/13 showed no evidence for osteomyelitis or abscess. s/p Grafix x 5 and Epifix x 3 application with significant improvement. Significantly improved after starting to offload with knee walker, which she used at home but not in public for fear of falling. Also using  darco shoe. Refused total contact cast secondary to fall history. Her ulcer was healed at her last clinic visit in Feb 2016. She returned to clinic after stepping on a piece of plastic in October 2016. She developed a painful callus and was seen by Dr. Alberteen Spindle in podiatry. The callus was debrided, and she was found to have an underlying ulceration. X-ray reportedly showed no evidence for underlying osteomyelitis. Repeat x-ray 07/16/2015 showed no evidence for osteomyelitis. She reports moderate pain with pressure. No claudication or rest pain. ABI 0.8. She has been applying silver alginate and offloading with a knee walker when possible and a postop shoe and new orthotics. Epifix initially applied 07/30/2015 with significant improvement. She returns to clinic for follow-up and is without new complaints. Moderate pain with pressure per above. No fever or chills. Minimal bloody drainage. 09/10/15; this patient has a small  wound that has roughly 3 mm in depth. There is some undermining at the Hinchliffe, [name] ([id number]) base of this perhaps 2 or 3 mm. The area is painful which the patient attributes to a callous. Patient has diabetic neuropathy. We used Prisma last time. The patient has an offloading shoe. She is reluctant to try a cast due to falling/balance issues. Her original wound was in the early part of 2015 in this area. She apparently underwent hyperbaric oxygen which caused cataracts according to the patient. She was healed in 2016 in February She continues to use her knee walker/scooter at home for offloading. 09/24/15; we've been using Iodosorb on this for the last 2 weeks without any real improvement. Again in a aggressive debridement today to remove callus nonviable subcutaneous tissue. The wound is deep but not grossly infected. She arrives today asking for Epifix to to be reapplied. I see that this was applied in December I'm not exactly sure why this was stopped. 2 x-rays have not shown osteomyelitis. She has a history of osteomyelitis however and I wonder whether an ABI is going to be necessary. She does also have a history of diabetic PAD whenever her ABI year is 0.8. I don't think that this is a primary arterial issue Objective Constitutional Vitals Time Taken: 10:35 AM, Height: 68 in, Weight: 154 lbs, BMI: 23.4, Temperature: 97.8 F, Pulse: 64 bpm, Respiratory Rate: 16 breaths/min, Blood Pressure: 186/64 mmHg. Integumentary (Hair, Skin) Wound #2 status is Open. Original cause of wound was Trauma. The wound is located on the Left,Plantar Foot. The wound measures 0.4cm length x 0.4cm width x 0.4cm depth; 0.126cm^2 area and 0.05cm^3 volume. The wound is limited to skin breakdown. There is no tunneling or undermining noted. There is a medium amount of serous drainage noted. The wound margin is flat and intact. There is large (67-100%) pink granulation within the wound bed. There is no necrotic  tissue within the wound bed. The periwound skin appearance exhibited: Callus, Moist. The periwound skin appearance did not exhibit: Crepitus, Excoriation, Fluctuance, Friable, Induration, Localized Edema, Rash, Scarring, Dry/Scaly, Maceration, Atrophie Blanche, Cyanosis, Ecchymosis, Hemosiderin Staining, Mottled, Pallor, Rubor, Erythema. Periwound temperature was noted as No Abnormality. The periwound has tenderness on palpation. Assessment Active Problems ICD-10 E11.621 - Type 2 diabetes mellitus with foot ulcer E11.40 - Type 2 diabetes mellitus with diabetic neuropathy, unspecified I70.245 - Atherosclerosis of native arteries of left leg with ulceration of other part of foot I25.10 - Atherosclerotic heart disease of native coronary artery without angina pectoris [name], [name] ([id number]) Procedures Wound #2 Wound #2 is a Diabetic Wound/Ulcer of the Lower Extremity  located on the Left,Plantar Foot . There was a Skin/Subcutaneous Tissue Debridement ([id number]) debridement with total area of 0.16 sq cm performed by Maxwell Caul, MD. with the following instrument(s): Blade and Forceps to remove Non-Viable tissue/material including Fibrin/Slough, Callus, and Subcutaneous after achieving pain control using Lidocaine 4% Topical Solution. A time out was conducted prior to the start of the procedure. A Moderate amount of bleeding was controlled with Silver Nitrate. The procedure was tolerated well with a pain level of 3 throughout and a pain level of 3 following the procedure. Post Debridement Measurements: 1cm length x 1cm width x 0.2cm depth; 0.157cm^3 volume. Post procedure Diagnosis Wound #2: Same as Pre-Procedure Plan Wound Cleansing: Wound #2 Left,Plantar Foot: Cleanse wound with mild soap and water May Shower, gently pat wound dry prior to applying new dressing. Anesthetic: Wound #2 Left,Plantar Foot: Topical Lidocaine 4% cream applied to wound bed prior to  debridement Primary Wound Dressing: Wound #2 Left,Plantar Foot: Iodoflex - use iodosorb ointment at home d/t being out of iodosorb ointment here Foam Secondary Dressing: Wound #2 Left,Plantar Foot: Boardered Foam Dressing Dressing Change Frequency: Wound #2 Left,Plantar Foot: Change dressing every other day. Follow-up Appointments: Wound #2 Left,Plantar Foot: Return Appointment in 1 week. Off-Loading: Wound #2 Left,Plantar Foot: Open toe surgical shoe with peg assist. Other: - felt Additional Orders / Instructions: Wound #2 Left,Plantar Foot: Increase protein intake. Dominique, [name] ([id number]) Activity as tolerated Other: - MVI, Vit C, Zinc General Notes: Order epifix for next week #1 no improvement at all in the condition of this wound. #2 she has been using Iodosorb for 2 weeks without improvement. #3 she states the area is painful which improves after removing callus but then worsens. I wonder if this may represent underlying infection. Certainly an MRI is not out of the question here. #4 apparently she is responded nicely to Epifix. IC reports that a single Epifix was placed in December I'm not sure why this was not continued. #5 the patient has a history of diabetic neuropathy, diabetic peripheral vascular disease. She cannot use a total contact cast because of balance issues. She states she is on her scooter at home 75% of the time #6 we applied for Epifix next week Electronic Signature(s) Signed: 09/24/2015 5:04:49 PM By: Baltazar Najjar MD Entered By: Baltazar Najjar on 09/24/2015 12:59:06 [name], [name] ([id number]) -------------------------------------------------------------------------------- SuperBill Details Patient Name: Dominique Marker K. Date of Service: 09/24/2015 Medical Record Patient Account Number: [id number] Number: Treating RN: Clover Mealy, RN, BSN, Rita 08-12-1950 [phone] y.o. Other Clinician: Date of Birth/Sex: Female) Treating Asianna Brundage,  Madine Sarr Primary Care Physician/Extender: Tawanna Solo Physician: Weeks in Treatment: 15 Referring Physician: Aram Beecham Diagnosis Coding ICD-10 Codes Code Description E11.621 Type 2 diabetes mellitus with foot ulcer E11.40 Type 2 diabetes mellitus with diabetic neuropathy, unspecified I70.245 Atherosclerosis of native arteries of left leg with ulceration of other part of foot I25.10 Atherosclerotic heart disease of native coronary artery without angina pectoris Facility Procedures CPT4 Code: [id number] Description: 11042 - DEB SUBQ TISSUE 20 SQ CM/< ICD-10 Description Diagnosis E11.621 Type 2 diabetes mellitus with foot ulcer Modifier: Quantity: 1 Physician Procedures CPT4 Code: [id number] Description: 11042 - WC PHYS SUBQ TISS 20 SQ CM ICD-10 Description Diagnosis E11.621 Type 2 diabetes mellitus with foot ulcer Modifier: Quantity: 1 Electronic Signature(s) Signed: 09/24/2015 5:04:49 PM By: Baltazar Najjar MD Entered By: Baltazar Najjar on 09/24/2015 12:59:45

## 2015-09-26 ENCOUNTER — Other Ambulatory Visit: Payer: Self-pay | Admitting: Internal Medicine

## 2015-09-26 DIAGNOSIS — R1310 Dysphagia, unspecified: Secondary | ICD-10-CM

## 2015-09-30 ENCOUNTER — Ambulatory Visit
Admission: RE | Admit: 2015-09-30 | Discharge: 2015-09-30 | Disposition: A | Discharge status: Home / Self Care | Payer: Medicare Other | Source: Ambulatory Visit | Attending: Surgery | Admitting: Surgery

## 2015-09-30 ENCOUNTER — Other Ambulatory Visit: Payer: Self-pay | Admitting: Surgery

## 2015-09-30 DIAGNOSIS — Z9889 Other specified postprocedural states: Secondary | ICD-10-CM | POA: Insufficient documentation

## 2015-09-30 DIAGNOSIS — M868X7 Other osteomyelitis, ankle and foot: Secondary | ICD-10-CM | POA: Insufficient documentation

## 2015-09-30 DIAGNOSIS — Z89422 Acquired absence of other left toe(s): Secondary | ICD-10-CM | POA: Diagnosis not present

## 2015-09-30 DIAGNOSIS — M869 Osteomyelitis, unspecified: Secondary | ICD-10-CM

## 2015-10-01 ENCOUNTER — Ambulatory Visit (INDEPENDENT_AMBULATORY_CARE_PROVIDER_SITE_OTHER): Payer: Medicare Other | Admitting: *Deleted

## 2015-10-01 ENCOUNTER — Ambulatory Visit: Payer: Medicare Other | Admitting: Internal Medicine

## 2015-10-01 DIAGNOSIS — I480 Paroxysmal atrial fibrillation: Secondary | ICD-10-CM

## 2015-10-01 DIAGNOSIS — Z5181 Encounter for therapeutic drug level monitoring: Secondary | ICD-10-CM | POA: Diagnosis not present

## 2015-10-01 DIAGNOSIS — I2699 Other pulmonary embolism without acute cor pulmonale: Secondary | ICD-10-CM | POA: Diagnosis not present

## 2015-10-01 LAB — POCT INR: INR: 2.5

## 2015-10-03 ENCOUNTER — Ambulatory Visit: Payer: Medicare Other

## 2015-10-07 ENCOUNTER — Other Ambulatory Visit: Payer: Self-pay | Admitting: Cardiovascular Disease

## 2015-10-08 NOTE — Telephone Encounter (Signed)
Please review for refill, Thank you. 

## 2015-10-15 ENCOUNTER — Telehealth: Payer: Self-pay | Admitting: Cardiovascular Disease

## 2015-10-15 ENCOUNTER — Telehealth: Payer: Self-pay

## 2015-10-15 ENCOUNTER — Ambulatory Visit (INDEPENDENT_AMBULATORY_CARE_PROVIDER_SITE_OTHER): Payer: Medicare Other

## 2015-10-15 ENCOUNTER — Ambulatory Visit (INDEPENDENT_AMBULATORY_CARE_PROVIDER_SITE_OTHER): Payer: Medicare Other | Admitting: Cardiovascular Disease

## 2015-10-15 ENCOUNTER — Encounter: Payer: Self-pay | Admitting: Cardiovascular Disease

## 2015-10-15 VITALS — BP 164/80 | HR 61 | Ht 69.0 in | Wt 190.2 lb

## 2015-10-15 DIAGNOSIS — Z951 Presence of aortocoronary bypass graft: Secondary | ICD-10-CM | POA: Diagnosis not present

## 2015-10-15 DIAGNOSIS — I25810 Atherosclerosis of coronary artery bypass graft(s) without angina pectoris: Secondary | ICD-10-CM

## 2015-10-15 DIAGNOSIS — E785 Hyperlipidemia, unspecified: Secondary | ICD-10-CM

## 2015-10-15 DIAGNOSIS — I2699 Other pulmonary embolism without acute cor pulmonale: Secondary | ICD-10-CM

## 2015-10-15 DIAGNOSIS — I480 Paroxysmal atrial fibrillation: Secondary | ICD-10-CM | POA: Diagnosis not present

## 2015-10-15 DIAGNOSIS — I739 Peripheral vascular disease, unspecified: Secondary | ICD-10-CM

## 2015-10-15 DIAGNOSIS — Z5181 Encounter for therapeutic drug level monitoring: Secondary | ICD-10-CM | POA: Diagnosis not present

## 2015-10-15 DIAGNOSIS — I10 Essential (primary) hypertension: Secondary | ICD-10-CM | POA: Diagnosis not present

## 2015-10-15 LAB — POCT INR: INR: 2

## 2015-10-15 MED ORDER — ROSUVASTATIN CALCIUM 40 MG PO TABS: 40.0000 mg | ORAL_TABLET | Freq: Every day | ORAL | Status: DC

## 2015-10-15 NOTE — Telephone Encounter (Signed)
Prior auth for Rosuvastatin 40mg  sent to [facility].

## 2015-10-15 NOTE — Assessment & Plan Note (Signed)
Tolerating warfarin, no complaints

## 2015-10-15 NOTE — Telephone Encounter (Signed)
Dr. Mariah MillingGollan is concerned that pt's elevated BP at today's ov may interfere w/ her scheduled LE angiogram on 10/21/15. He recommends that pt check her BP and home, as pt had some personal issues to discuss @ today's visit and this may not be an accurate indication of her readings. If BP is elevated, she can double her current losartan dose. Attempted to contact pt to discuss, no answer at her #, vm box is full. Left message on pt's husband's cell phone to call back.
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
## 2015-10-15 NOTE — Telephone Encounter (Signed)
I'm leaving it at the front desk for pt to p/u at her convenience.  Attempted to contact pt, but her vm is full.

## 2015-10-15 NOTE — Assessment & Plan Note (Signed)
Blood pressure elevated today, likely secondary to stress, anxiety. Recommended she monitor her blood pressure at home. If blood pressure continues to run high, would increase the dose of losartan

## 2015-10-15 NOTE — Assessment & Plan Note (Signed)
Currently with no symptoms of angina. No further workup at this time.  Stressed the need for better diabetes control.

## 2015-10-15 NOTE — Patient Instructions (Addendum)
You are doing well.  Please hold warfarin on Saturday for procedure on Tuesday Restart evening after procedure (tuesday night)  No losartan the evening before and none on Tuesday night Hold your meds on Tuesday AM  Please work on the sugars  Please call us if you have new issues that need to be addressed before your next appt.  Your physician wants you to follow-up in: 6 months.  You will receive a reminder letter in the mail two months in advance. If you don't receive a letter, please call our office to schedule the follow-up appointment.

## 2015-10-15 NOTE — Assessment & Plan Note (Signed)
Recommended she increase her Crestor up to 40 mg daily to achieve goal LDL less than 70

## 2015-10-15 NOTE — Telephone Encounter (Signed)
Patient said she left copy of angiogram from [facility] with dr. Mariah Millinggollan and he forgot to [facility] it [facility]acl at check out.   Can she come tomorrow after lunch to pick up?

## 2015-10-15 NOTE — Assessment & Plan Note (Signed)
Severe PAD, angiogram with possible angioplasty scheduled later this week with vein endovascular. Strongly recommended more aggressive approach to her diabetes High-dose Crestor as above

## 2015-10-15 NOTE — Assessment & Plan Note (Signed)
Maintaining normal sinus rhythm, we'll continue her current medications including metoprolol, continue warfarin   Total encounter time more than 25 minutes  Greater than 50% was spent in counseling and coordination of care with the patient

## 2015-10-16 NOTE — Telephone Encounter (Signed)
Spoke w/ pt.  She states that her BP is usually good, that it was most likely elevated yesterday due to stressors and the fact that she was here and nervous. She will monitor her BP at home and call the office next week w/ readings so that they will be documented. She verbalizes understanding to increase her losartan if her readings are elevated >150/90. She is appreciative of the call and will call back on Monday.

## 2015-10-17 ENCOUNTER — Telehealth: Payer: Self-pay

## 2015-10-17 NOTE — Telephone Encounter (Signed)
Rosuvastastin 40mg  approved through [facility]. Case ID  # [id number]. Pharmacy notified.

## 2015-10-20 ENCOUNTER — Other Ambulatory Visit: Payer: Self-pay | Admitting: Vascular Surgery

## 2015-10-20 ENCOUNTER — Encounter: Payer: Self-pay | Admitting: Cardiovascular Disease

## 2015-10-20 ENCOUNTER — Telehealth: Payer: Self-pay

## 2015-10-20 NOTE — Telephone Encounter (Signed)
Rosuvastatin 20mg  approved through [facility]99 by [facility].

## 2015-10-21 ENCOUNTER — Ambulatory Visit
Admission: RE | Admit: 2015-10-21 | Discharge: 2015-10-21 | Disposition: A | Discharge status: Home / Self Care | Payer: Medicare Other | Source: Ambulatory Visit | Attending: Vascular Surgery | Admitting: Vascular Surgery

## 2015-10-21 ENCOUNTER — Encounter: Payer: Self-pay | Admitting: *Deleted

## 2015-10-21 ENCOUNTER — Encounter
Admission: RE | Disposition: A | Discharge status: Home / Self Care | Payer: Self-pay | Source: Ambulatory Visit | Attending: Vascular Surgery

## 2015-10-21 DIAGNOSIS — I70228 Atherosclerosis of native arteries of extremities with rest pain, other extremity: Secondary | ICD-10-CM | POA: Insufficient documentation

## 2015-10-21 DIAGNOSIS — Z7982 Long term (current) use of aspirin: Secondary | ICD-10-CM | POA: Diagnosis not present

## 2015-10-21 DIAGNOSIS — Z79899 Other long term (current) drug therapy: Secondary | ICD-10-CM | POA: Insufficient documentation

## 2015-10-21 DIAGNOSIS — Z8342 Family history of familial hypercholesterolemia: Secondary | ICD-10-CM | POA: Insufficient documentation

## 2015-10-21 DIAGNOSIS — Z8249 Family history of ischemic heart disease and other diseases of the circulatory system: Secondary | ICD-10-CM | POA: Diagnosis not present

## 2015-10-21 DIAGNOSIS — I499 Cardiac arrhythmia, unspecified: Secondary | ICD-10-CM | POA: Insufficient documentation

## 2015-10-21 DIAGNOSIS — I70219 Atherosclerosis of native arteries of extremities with intermittent claudication, unspecified extremity: Secondary | ICD-10-CM | POA: Insufficient documentation

## 2015-10-21 DIAGNOSIS — I10 Essential (primary) hypertension: Secondary | ICD-10-CM | POA: Insufficient documentation

## 2015-10-21 DIAGNOSIS — Z7902 Long term (current) use of antithrombotics/antiplatelets: Secondary | ICD-10-CM | POA: Diagnosis not present

## 2015-10-21 DIAGNOSIS — Z9889 Other specified postprocedural states: Secondary | ICD-10-CM | POA: Insufficient documentation

## 2015-10-21 DIAGNOSIS — Z809 Family history of malignant neoplasm, unspecified: Secondary | ICD-10-CM | POA: Insufficient documentation

## 2015-10-21 DIAGNOSIS — E119 Type 2 diabetes mellitus without complications: Secondary | ICD-10-CM | POA: Insufficient documentation

## 2015-10-21 DIAGNOSIS — E785 Hyperlipidemia, unspecified: Secondary | ICD-10-CM | POA: Insufficient documentation

## 2015-10-21 DIAGNOSIS — I8310 Varicose veins of unspecified lower extremity with inflammation: Secondary | ICD-10-CM | POA: Insufficient documentation

## 2015-10-21 DIAGNOSIS — I739 Peripheral vascular disease, unspecified: Secondary | ICD-10-CM | POA: Diagnosis not present

## 2015-10-21 DIAGNOSIS — L97509 Non-pressure chronic ulcer of other part of unspecified foot with unspecified severity: Secondary | ICD-10-CM | POA: Insufficient documentation

## 2015-10-21 DIAGNOSIS — Z833 Family history of diabetes mellitus: Secondary | ICD-10-CM | POA: Insufficient documentation

## 2015-10-21 HISTORY — PX: PERIPHERAL VASCULAR CATHETERIZATION: SHX172C

## 2015-10-21 LAB — PROTIME-INR
INR: 1.08
PROTHROMBIN TIME: 14.2 s (ref 11.4–15.0)

## 2015-10-21 LAB — CREATININE, SERUM: CREATININE: 0.7 mg/dL (ref 0.44–1.00)

## 2015-10-21 LAB — BUN: BUN: 23 mg/dL — ABNORMAL HIGH (ref 6–20)

## 2015-10-21 SURGERY — ABDOMINAL AORTOGRAM W/LOWER EXTREMITY
Wound class: Clean

## 2015-10-21 MED ORDER — ONDANSETRON HCL 4 MG/2ML IJ SOLN: 4.0000 mg | Freq: Four times a day (QID) | INTRAMUSCULAR | Status: DC | PRN

## 2015-10-21 MED ORDER — LABETALOL HCL 5 MG/ML IV SOLN
INTRAVENOUS | Status: DC | PRN
Start: 2015-10-21 — End: 2015-10-21
  Administered 2015-10-21 (×2): 10 mg via INTRAVENOUS

## 2015-10-21 MED ORDER — FENTANYL CITRATE (PF) 100 MCG/2ML IJ SOLN
INTRAMUSCULAR | Status: AC
  Filled 2015-10-21: qty 2

## 2015-10-21 MED ORDER — LIDOCAINE HCL (PF) 1 % IJ SOLN
INTRAMUSCULAR | Status: AC
  Filled 2015-10-21: qty 30

## 2015-10-21 MED ORDER — HEPARIN SODIUM (PORCINE) 1000 UNIT/ML IJ SOLN
INTRAMUSCULAR | Status: AC
  Filled 2015-10-21: qty 1

## 2015-10-21 MED ORDER — CEFUROXIME SODIUM 1.5 G IJ SOLR
1.5000 g | INTRAMUSCULAR | Status: AC
  Administered 2015-10-21: 1.5 g via INTRAVENOUS

## 2015-10-21 MED ORDER — LABETALOL HCL 5 MG/ML IV SOLN
INTRAVENOUS | Status: AC
  Filled 2015-10-21: qty 4

## 2015-10-21 MED ORDER — IOHEXOL 300 MG/ML  SOLN
INTRAMUSCULAR | Status: DC | PRN
  Administered 2015-10-21: 60 mL via INTRA_ARTERIAL

## 2015-10-21 MED ORDER — MIDAZOLAM HCL 2 MG/2ML IJ SOLN
INTRAMUSCULAR | Status: DC | PRN
  Administered 2015-10-21: 2 mg via INTRAVENOUS
  Administered 2015-10-21 (×2): 1 mg via INTRAVENOUS

## 2015-10-21 MED ORDER — HYDROMORPHONE HCL 1 MG/ML IJ SOLN: 1.0000 mg | Freq: Once | INTRAMUSCULAR | Status: DC

## 2015-10-21 MED ORDER — LIDOCAINE HCL 1 % IJ SOLN
INTRAMUSCULAR | Status: DC | PRN
Start: 2015-10-21 — End: 2015-10-21
  Administered 2015-10-21: 5 mL via INTRADERMAL

## 2015-10-21 MED ORDER — FENTANYL CITRATE (PF) 100 MCG/2ML IJ SOLN
INTRAMUSCULAR | Status: DC | PRN
Start: 2015-10-21 — End: 2015-10-21
  Administered 2015-10-21 (×3): 50 ug via INTRAVENOUS

## 2015-10-21 MED ORDER — FAMOTIDINE 20 MG PO TABS: 40.0000 mg | ORAL_TABLET | ORAL | Status: DC | PRN

## 2015-10-21 MED ORDER — MIDAZOLAM HCL 5 MG/5ML IJ SOLN
INTRAMUSCULAR | Status: AC
  Filled 2015-10-21: qty 5

## 2015-10-21 MED ORDER — METHYLPREDNISOLONE SODIUM SUCC 125 MG IJ SOLR: 125.0000 mg | INTRAMUSCULAR | Status: DC | PRN

## 2015-10-21 MED ORDER — SODIUM CHLORIDE 0.9 % IV SOLN
INTRAVENOUS | Status: DC
  Administered 2015-10-21: 10:00:00 via INTRAVENOUS

## 2015-10-21 SURGICAL SUPPLY — 9 items
CATH PIG 70CM (CATHETERS) ×4 IMPLANT
DEVICE CLOSURE MYNXGRIP 5F (Vascular Products) ×2 IMPLANT
GLIDEWIRE ANGLED SS 035X260CM (WIRE) ×2 IMPLANT
PACK ANGIOGRAPHY (CUSTOM PROCEDURE TRAY) ×4 IMPLANT
SET INTRO CAPELLA COAXIAL (SET/KITS/TRAYS/PACK) ×4 IMPLANT
SHEATH BRITE TIP 5FRX11 (SHEATH) ×4 IMPLANT
SYR MEDRAD MARK V 150ML (SYRINGE) ×4 IMPLANT
TUBING CONTRAST HIGH PRESS 72 (TUBING) ×4 IMPLANT
WIRE J 3MM .035X145CM (WIRE) ×4 IMPLANT

## 2015-10-21 NOTE — H&P (Signed)
[facility] History & Physical Update  The patient was interviewed and re-examined.  The patient's previous History and Physical has been reviewed and is unchanged.  There is no change in the plan of care. We plan to proceed with the scheduled procedure.  Schnier, Latina CraverGregory G, MD  10/21/2015, 10:16 AM

## 2015-10-21 NOTE — Op Note (Signed)
[place] [facility]  Percutaneous Study/Intervention Procedural Note   Date of Surgery: 10/21/2015,11:51 AM  Surgeon:Schnier, Latina CraverGregory G   Pre-operative Diagnosis: Atherosclerotic occlusive disease bilateral lower extremities with rest pain left foot greater than right; hypertension; coronary artery disease; diabetes  Post-operative diagnosis:  Same  Procedure(s) Performed:  1.  Abdominal aortogram  2.  Bilateral lower extremity distal runoff  3.  Introduction catheter into left lower extremity third order catheter placement  4.  Minx closure right common femoral   Anesthesia: Conscious sedation was administered under my direct supervision. IV Versed plus fentanyl were utilized. Continuous ECG, pulse oximetry and blood pressure was monitored throughout the entire procedure. A total of 4 milligrams of Versed and 150 micrograms of fentanyl were utilized.  Conscious sedation was administered for a total of 40 minutes.  Sheath: 5 French sheath right common femoral  Contrast: 60 cc   Fluoroscopy Time: 3.3 minutes  Indications:  Patient presented to the office for routine follow-upnoting increasing pain in her lower extremities particularly with ambulation but also now occurring at rest. Patient noted left leg was much worse than the right and therefore she is scheduled for evaluation of her left lower extremity. The risks and benefits of been reviewed all questions answered patient has agreed to proceed  Procedure:  [name] a 65 y.o. female who was identified and appropriate procedural time out was performed.  The patient was then placed supine on the table and prepped and draped in the usual sterile fashion.  Ultrasound was used to evaluate the right common femoral artery.  It was patent .  A digital ultrasound image was acquired.  Amicropuncture needle was used to access the right common femoral artery under direct ultrasound guidance and a permanent image wassaved  for the record.microwire was then advanced under fluoroscopic guidance followed by micro-sheath.  A 0.035 J wire was advanced without resistance and a 5Fr sheath was placed.    Pigtail catheter was then advanced to the level of T12 and AP projection of the aorta was obtained. Pigtail catheter was then repositioned to above the bifurcation and RAO view of the pelvis was obtained. Stiff angled Glidewire and pigtail catheter was then used across the bifurcation and the catheter was positioned in the distal external iliac artery.  LAO of the left groin was then obtained. Wire was reintroduced and negotiated into the SFA and the catheter was advanced into the SFA. Distal runoff was then performed.  Pigtail catheter was then removed over wire and right lower extremity angiography was performed by hand injection through the femoral sheath.  After review of the images the catheter was removed over wire and an RAO view of the groin was obtained. Minx device was deployed without difficulty.  Findings:   Aortogram:  The abdominal aorta is patent. There are single renal arteries noted bilaterally. Right renal artery is widely patent left renal artery demonstrates a string sign greater than 90% stenosis at its origin. The distal aorta demonstrates atherosclerotic changes but no hemodynamically significant lesions and the common and external iliac arteries are widely patent bilaterally. Internal iliac arteries are patent bilaterally.  Right Lower Extremity:  Common femoral and profunda femoris are widely patent. There is a fairly high bifurcation of the femoral vessels. The SFA is patent in its proximal one third in its midportion it demonstrates a short segment string sign the above-the-knee popliteal is reconstituted and is patent down to the trifurcation. Anterior tibial appears to occlude shortly after  its origin but the posterior tibial peroneal appear patent to the distal calf  Left Lower Extremity:  The common  femoral profunda femoris and superficial femoral arteries as well as the popliteal demonstrate diffuse disease but there are no hemodynamically significant lesions at Hunter's canal there is a smooth 30% diameter reduction noted and that is the worst lesion identified. The trifurcation is profoundly diseased with occlusion of the anterior tibial just after it's origin and occlusion of the posterior tibial throughout its entire course. Neither the anterior tibial nor the posterior tibial reconstitute distally at the ankle. The peroneal is patent throughout its length there appears to be some smooth 25-30% diameter reduction at the origin. Distally the peroneal fills the pedal arch there is filling of a disease lateral plantar from the peroneal collaterals there is severe distal small vessel disease.  SUMMARY: There is a 90% stenosis at the origin of the left renal artery; there is a greater than 80% stenosis of the right SFA; there is severe tibial disease bilaterally with 2 vessel runoff on the right and single-vessel runoff on the left.   Disposition: Patient was taken to the recovery room in stable condition having tolerated the procedure well.  Schnier, Latina Craver 10/21/2015,11:51 AM

## 2015-10-21 NOTE — Progress Notes (Signed)
Pt clinically stable post procedure, right groin without bleeding nor hematoma, denies complaint,Dr Schnier out to speak with patient and family member. Questions answered,

## 2015-10-21 NOTE — OR Nursing (Signed)
10:40 patient having to go freqently  to void due to taking po lasix and being nervous about procedure. A #16 french foley placed under sterile technique and connected to drainage bag.

## 2015-10-22 ENCOUNTER — Ambulatory Visit: Payer: Medicare Other | Admitting: Cardiovascular Disease

## 2015-11-05 ENCOUNTER — Ambulatory Visit (INDEPENDENT_AMBULATORY_CARE_PROVIDER_SITE_OTHER): Payer: Medicare Other

## 2015-11-05 DIAGNOSIS — I2699 Other pulmonary embolism without acute cor pulmonale: Secondary | ICD-10-CM | POA: Diagnosis not present

## 2015-11-05 DIAGNOSIS — I480 Paroxysmal atrial fibrillation: Secondary | ICD-10-CM

## 2015-11-05 DIAGNOSIS — Z5181 Encounter for therapeutic drug level monitoring: Secondary | ICD-10-CM

## 2015-11-05 LAB — POCT INR: INR: 1.4

## 2015-11-06 ENCOUNTER — Other Ambulatory Visit: Payer: Self-pay | Admitting: Vascular Surgery

## 2015-11-11 ENCOUNTER — Ambulatory Visit
Admission: RE | Admit: 2015-11-11 | Discharge: 2015-11-11 | Disposition: A | Discharge status: Home / Self Care | Payer: Medicare Other | Source: Ambulatory Visit | Attending: Vascular Surgery | Admitting: Vascular Surgery

## 2015-11-11 ENCOUNTER — Encounter
Admission: RE | Disposition: A | Discharge status: Home / Self Care | Payer: Self-pay | Source: Ambulatory Visit | Attending: Vascular Surgery

## 2015-11-11 ENCOUNTER — Encounter: Payer: Self-pay | Admitting: *Deleted

## 2015-11-11 DIAGNOSIS — I499 Cardiac arrhythmia, unspecified: Secondary | ICD-10-CM | POA: Diagnosis not present

## 2015-11-11 DIAGNOSIS — Z8249 Family history of ischemic heart disease and other diseases of the circulatory system: Secondary | ICD-10-CM | POA: Insufficient documentation

## 2015-11-11 DIAGNOSIS — Z809 Family history of malignant neoplasm, unspecified: Secondary | ICD-10-CM | POA: Insufficient documentation

## 2015-11-11 DIAGNOSIS — Z79899 Other long term (current) drug therapy: Secondary | ICD-10-CM | POA: Insufficient documentation

## 2015-11-11 DIAGNOSIS — Z9889 Other specified postprocedural states: Secondary | ICD-10-CM | POA: Diagnosis not present

## 2015-11-11 DIAGNOSIS — I6523 Occlusion and stenosis of bilateral carotid arteries: Secondary | ICD-10-CM | POA: Insufficient documentation

## 2015-11-11 DIAGNOSIS — Z833 Family history of diabetes mellitus: Secondary | ICD-10-CM | POA: Insufficient documentation

## 2015-11-11 DIAGNOSIS — I8310 Varicose veins of unspecified lower extremity with inflammation: Secondary | ICD-10-CM | POA: Insufficient documentation

## 2015-11-11 DIAGNOSIS — I739 Peripheral vascular disease, unspecified: Secondary | ICD-10-CM | POA: Diagnosis not present

## 2015-11-11 DIAGNOSIS — Z7982 Long term (current) use of aspirin: Secondary | ICD-10-CM | POA: Insufficient documentation

## 2015-11-11 DIAGNOSIS — L97509 Non-pressure chronic ulcer of other part of unspecified foot with unspecified severity: Secondary | ICD-10-CM | POA: Insufficient documentation

## 2015-11-11 DIAGNOSIS — Z823 Family history of stroke: Secondary | ICD-10-CM | POA: Insufficient documentation

## 2015-11-11 DIAGNOSIS — I701 Atherosclerosis of renal artery: Secondary | ICD-10-CM | POA: Insufficient documentation

## 2015-11-11 DIAGNOSIS — E119 Type 2 diabetes mellitus without complications: Secondary | ICD-10-CM | POA: Diagnosis not present

## 2015-11-11 DIAGNOSIS — I10 Essential (primary) hypertension: Secondary | ICD-10-CM | POA: Diagnosis not present

## 2015-11-11 DIAGNOSIS — E785 Hyperlipidemia, unspecified: Secondary | ICD-10-CM | POA: Diagnosis not present

## 2015-11-11 DIAGNOSIS — I70219 Atherosclerosis of native arteries of extremities with intermittent claudication, unspecified extremity: Secondary | ICD-10-CM | POA: Diagnosis not present

## 2015-11-11 DIAGNOSIS — Z8342 Family history of familial hypercholesterolemia: Secondary | ICD-10-CM | POA: Insufficient documentation

## 2015-11-11 DIAGNOSIS — Z7902 Long term (current) use of antithrombotics/antiplatelets: Secondary | ICD-10-CM | POA: Insufficient documentation

## 2015-11-11 HISTORY — PX: PERIPHERAL VASCULAR CATHETERIZATION: SHX172C

## 2015-11-11 HISTORY — DX: Anxiety disorder, unspecified: F41.9

## 2015-11-11 HISTORY — DX: Depression, unspecified: F32.A

## 2015-11-11 HISTORY — DX: Major depressive disorder, single episode, unspecified: F32.9

## 2015-11-11 HISTORY — DX: Anemia, unspecified: D64.9

## 2015-11-11 LAB — BASIC METABOLIC PANEL
ANION GAP: 7 (ref 5–15)
BUN: 17 mg/dL (ref 6–20)
CO2: 24 mmol/L (ref 22–32)
Calcium: 9.5 mg/dL (ref 8.9–10.3)
Chloride: 103 mmol/L (ref 101–111)
Creatinine, Ser: 0.62 mg/dL (ref 0.44–1.00)
GFR calc Af Amer: >60 mL/min (ref >60)
Glucose, Bld: 222 mg/dL — ABNORMAL HIGH (ref 65–99)
POTASSIUM: 4.2 mmol/L (ref 3.5–5.1)
SODIUM: 134 mmol/L — AB (ref 135–145)

## 2015-11-11 LAB — PROTIME-INR
INR: 1.2
Prothrombin Time: 15.4 seconds — ABNORMAL HIGH (ref 11.4–15.0)

## 2015-11-11 SURGERY — RENAL ANGIOGRAPHY
Anesthesia: Moderate Sedation | Laterality: Right

## 2015-11-11 MED ORDER — FENTANYL CITRATE (PF) 100 MCG/2ML IJ SOLN
INTRAMUSCULAR | Status: AC
  Filled 2015-11-11: qty 2

## 2015-11-11 MED ORDER — LIDOCAINE HCL (PF) 1 % IJ SOLN
INTRAMUSCULAR | Status: AC
  Filled 2015-11-11: qty 30

## 2015-11-11 MED ORDER — ATENOLOL 100 MG PO TABS: 50.0000 mg | ORAL_TABLET | Freq: Every day | ORAL | Status: DC

## 2015-11-11 MED ORDER — LOSARTAN POTASSIUM 25 MG PO TABS: 12.5000 mg | ORAL_TABLET | Freq: Every day | ORAL | Status: DC

## 2015-11-11 MED ORDER — MIDAZOLAM HCL 2 MG/2ML IJ SOLN
INTRAMUSCULAR | Status: DC | PRN
  Administered 2015-11-11 (×3): 1 mg via INTRAVENOUS
  Administered 2015-11-11: 2 mg via INTRAVENOUS

## 2015-11-11 MED ORDER — SODIUM CHLORIDE FLUSH 0.9 % IV SOLN
INTRAVENOUS | Status: AC
  Filled 2015-11-11: qty 10

## 2015-11-11 MED ORDER — METHYLPREDNISOLONE SODIUM SUCC 125 MG IJ SOLR: 125.0000 mg | INTRAMUSCULAR | Status: DC | PRN

## 2015-11-11 MED ORDER — FAMOTIDINE 20 MG PO TABS: 40.0000 mg | ORAL_TABLET | ORAL | Status: DC | PRN

## 2015-11-11 MED ORDER — HYDROMORPHONE HCL 1 MG/ML IJ SOLN: 1.0000 mg | Freq: Once | INTRAMUSCULAR | Status: DC

## 2015-11-11 MED ORDER — SODIUM CHLORIDE 0.9 % IV SOLN
INTRAVENOUS | Status: DC
  Administered 2015-11-11: 10:00:00 via INTRAVENOUS

## 2015-11-11 MED ORDER — CEFUROXIME SODIUM 1.5 G IJ SOLR
1.5000 g | INTRAMUSCULAR | Status: AC
  Administered 2015-11-11: 1.5 g via INTRAVENOUS

## 2015-11-11 MED ORDER — IOPAMIDOL (ISOVUE-300) INJECTION 61%
INTRAVENOUS | Status: DC | PRN
  Administered 2015-11-11: 35 mL via INTRA_ARTERIAL

## 2015-11-11 MED ORDER — HEPARIN SODIUM (PORCINE) 1000 UNIT/ML IJ SOLN
INTRAMUSCULAR | Status: AC
  Filled 2015-11-11: qty 1

## 2015-11-11 MED ORDER — HEPARIN SODIUM (PORCINE) 1000 UNIT/ML IJ SOLN
INTRAMUSCULAR | Status: DC | PRN
  Administered 2015-11-11: 4000 [IU] via INTRAVENOUS

## 2015-11-11 MED ORDER — MIDAZOLAM HCL 2 MG/2ML IJ SOLN
INTRAMUSCULAR | Status: AC
  Filled 2015-11-11: qty 2

## 2015-11-11 MED ORDER — HEPARIN (PORCINE) IN NACL 2-0.9 UNIT/ML-% IJ SOLN
INTRAMUSCULAR | Status: AC
  Filled 2015-11-11: qty 1000

## 2015-11-11 MED ORDER — FENTANYL CITRATE (PF) 100 MCG/2ML IJ SOLN
INTRAMUSCULAR | Status: DC | PRN
  Administered 2015-11-11 (×3): 50 ug via INTRAVENOUS

## 2015-11-11 MED ORDER — ONDANSETRON HCL 4 MG/2ML IJ SOLN: 4.0000 mg | Freq: Four times a day (QID) | INTRAMUSCULAR | Status: DC | PRN

## 2015-11-11 MED ORDER — MIDAZOLAM HCL 2 MG/2ML IJ SOLN
INTRAMUSCULAR | Status: AC
  Filled 2015-11-11: qty 4

## 2015-11-11 SURGICAL SUPPLY — 15 items
CATH 5FR REUT (CATHETERS) ×1 IMPLANT
CATH PIG 70CM (CATHETERS) ×4 IMPLANT
DEVICE CLOSURE MYNXGRIP 5F (Vascular Products) ×1 IMPLANT
DEVICE PRESTO INFLATION (MISCELLANEOUS) ×1 IMPLANT
DEVICE TORQUE (MISCELLANEOUS) ×1 IMPLANT
GUIDEWIRE ANGLED .035 180CM (WIRE) ×1 IMPLANT
PACK ANGIOGRAPHY (CUSTOM PROCEDURE TRAY) ×4 IMPLANT
SET INTRO CAPELLA COAXIAL (SET/KITS/TRAYS/PACK) ×4 IMPLANT
SHEATH ANL 5FRX45 (SHEATH) ×1 IMPLANT
SHEATH BRITE TIP 5FRX11 (SHEATH) ×4 IMPLANT
STENT HERCULINK RX 6.0X15X135 (Permanent Stent) ×1 IMPLANT
SYR MEDRAD MARK V 150ML (SYRINGE) ×4 IMPLANT
TUBING CONTRAST HIGH PRESS 72 (TUBING) ×4 IMPLANT
WIRE J 3MM .035X145CM (WIRE) ×4 IMPLANT
WIRE SPARTACORE .014X190CM (WIRE) ×1 IMPLANT

## 2015-11-11 NOTE — Op Note (Signed)
[facility] Percutaneous Study/Intervention Procedural Note    Surgeon(s): Civil engineer, contracting, Dolores Lory  Assistants: None  Pre-operative Diagnosis: Left renal artery stenosis, renovascular hypertension  Post-operative diagnosis: Same  Procedure(s) Performed: 1. Ultrasound guidance for vascular access right femoral artery 2. Catheter placement into left renal artery from right femoral approach 3. Aortogram and selective left renal angiogram 4. Balloon expandable stent placement to ostia of the left renal artery with a 6 mm diameter x 15 mm length stent Herculink 5. Minx closure device right femoral artery  Contrast: 30 cc  EBL: Less than 10 cc   Fluoro Time: 3 minutes  Moderate conscious sedation: Approximately 40 minutes with 4 mg of Versed and 150 mcg of Fentanyl  Indications: The patient is a 65 year old woman with worsening severe hypertension despite 3 antihypertensive medications. The patient has suboptimal blood pressure control despite multiple antihypertensives and a noninvasive study demonstrating hemodynamically significant left renal artery stenosis. Given the clinical scenario and the noninvasive findings, angiogram is indicated for further evaluation of her renal artery and potential treatment. Risks and benefits are discussed and informed consent is obtained.  Procedure: The patient was identified and appropriate procedural time out was performed. The patient was then placed supine on the table and prepped and draped in the usual sterile fashion.Moderate conscious sedation was administered throughout the procedure with my supervision of the RN administering medicines and monitoring the patients vital signs and mental status throughout from the start of the procedure until the patient was taken to the recovery room  Ultrasound was used to evaluate the left common femoral  artery. It was patent . A digital ultrasound image was acquired. A micropuncture needle was used to access the right common femoral artery under direct ultrasound guidance and a permanent image was performed. A microwire was then advanced under fluoroscopic guidance followed by a micro-sheath. A 0.035 J wire was advanced without resistance and a 5Fr sheath was placed.   Pigtail catheter was placed into the aorta at the T12 level and an AP aortogram was performed. This demonstrated greater than 80% stenosis of the ostia of the left renal artery. The patient was then systemically heparinized with 4000 units of intravenous heparin and Ansell to 5 [place] sheath was opened onto the field and prepped floppy Glidewire was introduced through the pigtail catheter and the pigtail catheter 5 French sheath were removed. The Ansell sheath was then advanced to that the tip was located in the distal aorta. The VS1 catheter was then advanced over the floppy Glidewire reformed in the distal thoracic aorta and used to select the left renal artery. Hand injection contrast demonstrated appropriate positioning of the catheter with complete imaging of the left renal artery.  The 014 wire was then advanced under fluoroscopic guidance into the distal renal artery vasculature and subsequently the Ansell II sheath was advanced over the VS 1 wire Kumpe pronation through the renal artery stenosis. It is 1 catheter was then removed.  I then selected an Omnilink 6 mm diameter x 15 mm length balloon expandable stent and brought this across the lesion.  This was deployed encompassing the lesion with its proximal extent going back into the aorta for a mm or two.  This was inflated to 10 ATM and the waist resolved.  Completion angiogram showed wide patency of the stent and the renal artery the artery and stent are well opposed distally no change in the distal outflow.  The sheath was removed. Oblique arteriogram was performed of  the  right femoral artery and minx closure device was deployed in the usual fashion with excellent hemostatic result. The patient was taken to the recovery room in stable condition having tolerated the procedure well.  Findings:  Aortogram/Renal Arteries:Abdominal aorta is widely patent. There is an 80-90% stenosis at the ostia of the left renal artery right renal arteries widely patent. Distally within the left renal artery system the flow appears normal and there is a normal nephrogram.  Following and plasty and stent placement with a single inflation to 10 atm there is now wide patency of the renal artery.   Condition:  Stable  Complications: None   Katha Cabal 11/11/2015 1:50 PM

## 2015-11-11 NOTE — H&P (Signed)
[facility] History & Physical Update  The patient was interviewed and re-examined.  The patient's previous History and Physical has been reviewed and is unchanged.  There is no change in the plan of care. We plan to proceed with the scheduled procedure.  Marqus Macphee, Latina CraverGregory G, MD  11/11/2015, 11:49 AM

## 2015-11-12 ENCOUNTER — Encounter: Payer: Self-pay | Admitting: Vascular Surgery

## 2015-11-19 ENCOUNTER — Ambulatory Visit (INDEPENDENT_AMBULATORY_CARE_PROVIDER_SITE_OTHER): Payer: Medicare Other

## 2015-11-19 DIAGNOSIS — Z5181 Encounter for therapeutic drug level monitoring: Secondary | ICD-10-CM

## 2015-11-19 DIAGNOSIS — I2699 Other pulmonary embolism without acute cor pulmonale: Secondary | ICD-10-CM | POA: Diagnosis not present

## 2015-11-19 DIAGNOSIS — I480 Paroxysmal atrial fibrillation: Secondary | ICD-10-CM | POA: Diagnosis not present

## 2015-11-19 LAB — POCT INR: INR: 3.5

## 2015-11-21 ENCOUNTER — Telehealth: Payer: Self-pay | Admitting: Cardiovascular Disease
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
## 2015-11-24 ENCOUNTER — Other Ambulatory Visit: Payer: Self-pay | Admitting: Vascular Surgery

## 2015-11-24 ENCOUNTER — Other Ambulatory Visit
Admission: RE | Admit: 2015-11-24 | Discharge: 2015-11-24 | Disposition: A | Discharge status: Home / Self Care | Payer: Medicare Other | Source: Ambulatory Visit | Attending: Vascular Surgery | Admitting: Vascular Surgery

## 2015-11-24 DIAGNOSIS — I10 Essential (primary) hypertension: Secondary | ICD-10-CM | POA: Diagnosis not present

## 2015-11-24 DIAGNOSIS — L97509 Non-pressure chronic ulcer of other part of unspecified foot with unspecified severity: Secondary | ICD-10-CM | POA: Diagnosis not present

## 2015-11-24 DIAGNOSIS — I8310 Varicose veins of unspecified lower extremity with inflammation: Secondary | ICD-10-CM | POA: Diagnosis not present

## 2015-11-24 DIAGNOSIS — Z9889 Other specified postprocedural states: Secondary | ICD-10-CM | POA: Diagnosis not present

## 2015-11-24 DIAGNOSIS — I701 Atherosclerosis of renal artery: Secondary | ICD-10-CM | POA: Diagnosis not present

## 2015-11-24 DIAGNOSIS — R079 Chest pain, unspecified: Secondary | ICD-10-CM

## 2015-11-24 DIAGNOSIS — Z7901 Long term (current) use of anticoagulants: Secondary | ICD-10-CM | POA: Diagnosis not present

## 2015-11-24 DIAGNOSIS — I480 Paroxysmal atrial fibrillation: Secondary | ICD-10-CM

## 2015-11-24 DIAGNOSIS — Z79899 Other long term (current) drug therapy: Secondary | ICD-10-CM | POA: Diagnosis not present

## 2015-11-24 DIAGNOSIS — Z809 Family history of malignant neoplasm, unspecified: Secondary | ICD-10-CM | POA: Diagnosis not present

## 2015-11-24 DIAGNOSIS — E119 Type 2 diabetes mellitus without complications: Secondary | ICD-10-CM | POA: Diagnosis not present

## 2015-11-24 DIAGNOSIS — Z833 Family history of diabetes mellitus: Secondary | ICD-10-CM | POA: Diagnosis not present

## 2015-11-24 DIAGNOSIS — R0602 Shortness of breath: Secondary | ICD-10-CM

## 2015-11-24 DIAGNOSIS — E785 Hyperlipidemia, unspecified: Secondary | ICD-10-CM | POA: Diagnosis not present

## 2015-11-24 DIAGNOSIS — I2699 Other pulmonary embolism without acute cor pulmonale: Secondary | ICD-10-CM

## 2015-11-24 DIAGNOSIS — I70221 Atherosclerosis of native arteries of extremities with rest pain, right leg: Secondary | ICD-10-CM | POA: Diagnosis present

## 2015-11-24 DIAGNOSIS — Z7982 Long term (current) use of aspirin: Secondary | ICD-10-CM | POA: Diagnosis not present

## 2015-11-24 DIAGNOSIS — Z8249 Family history of ischemic heart disease and other diseases of the circulatory system: Secondary | ICD-10-CM | POA: Diagnosis not present

## 2015-11-24 DIAGNOSIS — Z5181 Encounter for therapeutic drug level monitoring: Secondary | ICD-10-CM

## 2015-11-24 DIAGNOSIS — I499 Cardiac arrhythmia, unspecified: Secondary | ICD-10-CM | POA: Diagnosis not present

## 2015-11-24 DIAGNOSIS — Z823 Family history of stroke: Secondary | ICD-10-CM | POA: Diagnosis not present

## 2015-11-24 DIAGNOSIS — I739 Peripheral vascular disease, unspecified: Secondary | ICD-10-CM | POA: Diagnosis not present

## 2015-11-24 DIAGNOSIS — Z8342 Family history of familial hypercholesterolemia: Secondary | ICD-10-CM | POA: Diagnosis not present

## 2015-11-24 DIAGNOSIS — I6523 Occlusion and stenosis of bilateral carotid arteries: Secondary | ICD-10-CM | POA: Diagnosis not present

## 2015-11-24 LAB — BASIC METABOLIC PANEL
ANION GAP: 6 (ref 5–15)
BUN: 22 mg/dL — ABNORMAL HIGH (ref 6–20)
CHLORIDE: 103 mmol/L (ref 101–111)
CO2: 26 mmol/L (ref 22–32)
CREATININE: 0.63 mg/dL (ref 0.44–1.00)
Calcium: 9.6 mg/dL (ref 8.9–10.3)
GFR calc non Af Amer: >60 mL/min (ref >60)
Glucose, Bld: 192 mg/dL — ABNORMAL HIGH (ref 65–99)
Potassium: 5.1 mmol/L (ref 3.5–5.1)
SODIUM: 135 mmol/L (ref 135–145)

## 2015-11-24 LAB — PROTIME-INR
INR: 1.08
PROTHROMBIN TIME: 14.2 s (ref 11.4–15.0)

## 2015-11-25 ENCOUNTER — Ambulatory Visit
Admission: RE | Admit: 2015-11-25 | Discharge: 2015-11-25 | Disposition: A | Discharge status: Home / Self Care | Payer: Medicare Other | Source: Ambulatory Visit | Attending: Vascular Surgery | Admitting: Vascular Surgery

## 2015-11-25 ENCOUNTER — Encounter
Admission: RE | Disposition: A | Discharge status: Home / Self Care | Payer: Self-pay | Source: Ambulatory Visit | Attending: Vascular Surgery

## 2015-11-25 ENCOUNTER — Encounter: Payer: Self-pay | Admitting: *Deleted

## 2015-11-25 DIAGNOSIS — E785 Hyperlipidemia, unspecified: Secondary | ICD-10-CM | POA: Insufficient documentation

## 2015-11-25 DIAGNOSIS — Z9889 Other specified postprocedural states: Secondary | ICD-10-CM | POA: Insufficient documentation

## 2015-11-25 DIAGNOSIS — I739 Peripheral vascular disease, unspecified: Secondary | ICD-10-CM | POA: Insufficient documentation

## 2015-11-25 DIAGNOSIS — L97509 Non-pressure chronic ulcer of other part of unspecified foot with unspecified severity: Secondary | ICD-10-CM | POA: Insufficient documentation

## 2015-11-25 DIAGNOSIS — I8310 Varicose veins of unspecified lower extremity with inflammation: Secondary | ICD-10-CM | POA: Insufficient documentation

## 2015-11-25 DIAGNOSIS — Z809 Family history of malignant neoplasm, unspecified: Secondary | ICD-10-CM | POA: Insufficient documentation

## 2015-11-25 DIAGNOSIS — E119 Type 2 diabetes mellitus without complications: Secondary | ICD-10-CM | POA: Insufficient documentation

## 2015-11-25 DIAGNOSIS — Z8342 Family history of familial hypercholesterolemia: Secondary | ICD-10-CM | POA: Insufficient documentation

## 2015-11-25 DIAGNOSIS — I701 Atherosclerosis of renal artery: Secondary | ICD-10-CM | POA: Insufficient documentation

## 2015-11-25 DIAGNOSIS — Z7901 Long term (current) use of anticoagulants: Secondary | ICD-10-CM | POA: Insufficient documentation

## 2015-11-25 DIAGNOSIS — Z833 Family history of diabetes mellitus: Secondary | ICD-10-CM | POA: Insufficient documentation

## 2015-11-25 DIAGNOSIS — Z8249 Family history of ischemic heart disease and other diseases of the circulatory system: Secondary | ICD-10-CM | POA: Insufficient documentation

## 2015-11-25 DIAGNOSIS — Z823 Family history of stroke: Secondary | ICD-10-CM | POA: Insufficient documentation

## 2015-11-25 DIAGNOSIS — Z7982 Long term (current) use of aspirin: Secondary | ICD-10-CM | POA: Insufficient documentation

## 2015-11-25 DIAGNOSIS — I6523 Occlusion and stenosis of bilateral carotid arteries: Secondary | ICD-10-CM | POA: Insufficient documentation

## 2015-11-25 DIAGNOSIS — Z79899 Other long term (current) drug therapy: Secondary | ICD-10-CM | POA: Insufficient documentation

## 2015-11-25 DIAGNOSIS — I10 Essential (primary) hypertension: Secondary | ICD-10-CM | POA: Insufficient documentation

## 2015-11-25 DIAGNOSIS — I70221 Atherosclerosis of native arteries of extremities with rest pain, right leg: Secondary | ICD-10-CM | POA: Insufficient documentation

## 2015-11-25 DIAGNOSIS — I499 Cardiac arrhythmia, unspecified: Secondary | ICD-10-CM | POA: Insufficient documentation

## 2015-11-25 HISTORY — PX: PERIPHERAL VASCULAR CATHETERIZATION: SHX172C

## 2015-11-25 LAB — GLUCOSE, CAPILLARY: Glucose-Capillary: 183 mg/dL — ABNORMAL HIGH (ref 65–99)

## 2015-11-25 SURGERY — LOWER EXTREMITY ANGIOGRAPHY
Anesthesia: Moderate Sedation | Site: Leg Lower | Laterality: Right

## 2015-11-25 MED ORDER — ACETAMINOPHEN 325 MG RE SUPP
325.0000 mg | RECTAL | Status: DC | PRN
  Filled 2015-11-25: qty 2

## 2015-11-25 MED ORDER — FAMOTIDINE 20 MG PO TABS: 40.0000 mg | ORAL_TABLET | ORAL | Status: DC | PRN

## 2015-11-25 MED ORDER — ACETAMINOPHEN 325 MG PO TABS: 325.0000 mg | ORAL_TABLET | ORAL | Status: DC | PRN

## 2015-11-25 MED ORDER — DIPHENHYDRAMINE HCL 50 MG/ML IJ SOLN
INTRAMUSCULAR | Status: DC | PRN
  Administered 2015-11-25: 50 mg via INTRAVENOUS

## 2015-11-25 MED ORDER — IOPAMIDOL (ISOVUE-300) INJECTION 61%
INTRAVENOUS | Status: DC | PRN
Start: 2015-11-25 — End: 2015-11-25
  Administered 2015-11-25: 70 mL via INTRA_ARTERIAL

## 2015-11-25 MED ORDER — LIDOCAINE HCL (PF) 1 % IJ SOLN
INTRAMUSCULAR | Status: AC
  Filled 2015-11-25: qty 30

## 2015-11-25 MED ORDER — DIPHENHYDRAMINE HCL 50 MG/ML IJ SOLN
INTRAMUSCULAR | Status: AC
  Filled 2015-11-25: qty 1

## 2015-11-25 MED ORDER — HYDROMORPHONE HCL 1 MG/ML IJ SOLN: 1.0000 mg | Freq: Once | INTRAMUSCULAR | Status: DC

## 2015-11-25 MED ORDER — MIDAZOLAM HCL 2 MG/2ML IJ SOLN
INTRAMUSCULAR | Status: DC | PRN
  Administered 2015-11-25: 1 mg via INTRAVENOUS
  Administered 2015-11-25: 2 mg via INTRAVENOUS
  Administered 2015-11-25 (×2): 1 mg via INTRAVENOUS

## 2015-11-25 MED ORDER — DEXTROSE 5 % IV SOLN
1.5000 g | INTRAVENOUS | Status: AC
  Administered 2015-11-25: 1.5 g via INTRAVENOUS

## 2015-11-25 MED ORDER — METHYLPREDNISOLONE SODIUM SUCC 125 MG IJ SOLR: 125.0000 mg | INTRAMUSCULAR | Status: DC | PRN

## 2015-11-25 MED ORDER — HEPARIN SODIUM (PORCINE) 1000 UNIT/ML IJ SOLN
INTRAMUSCULAR | Status: AC
  Filled 2015-11-25: qty 1

## 2015-11-25 MED ORDER — FENTANYL CITRATE (PF) 100 MCG/2ML IJ SOLN
INTRAMUSCULAR | Status: DC | PRN
  Administered 2015-11-25 (×4): 50 ug via INTRAVENOUS

## 2015-11-25 MED ORDER — FENTANYL CITRATE (PF) 100 MCG/2ML IJ SOLN
INTRAMUSCULAR | Status: AC
  Filled 2015-11-25: qty 2

## 2015-11-25 MED ORDER — SODIUM CHLORIDE 0.9 % IV SOLN
INTRAVENOUS | Status: DC
  Administered 2015-11-25: 10:00:00 via INTRAVENOUS

## 2015-11-25 MED ORDER — LIDOCAINE HCL (PF) 1 % IJ SOLN
INTRAMUSCULAR | Status: DC | PRN
  Administered 2015-11-25: 5 mL via INTRADERMAL

## 2015-11-25 MED ORDER — ONDANSETRON HCL 4 MG/2ML IJ SOLN: 4.0000 mg | Freq: Four times a day (QID) | INTRAMUSCULAR | Status: DC | PRN

## 2015-11-25 MED ORDER — CEFUROXIME SODIUM 1.5 G IJ SOLR
INTRAMUSCULAR | Status: AC
  Filled 2015-11-25 (×33): qty 1.5

## 2015-11-25 MED ORDER — MIDAZOLAM HCL 5 MG/5ML IJ SOLN
INTRAMUSCULAR | Status: AC
  Filled 2015-11-25: qty 5

## 2015-11-25 MED ORDER — HEPARIN SODIUM (PORCINE) 1000 UNIT/ML IJ SOLN
INTRAMUSCULAR | Status: DC | PRN
  Administered 2015-11-25: 1500 [IU] via INTRAVENOUS
  Administered 2015-11-25: 2000 [IU] via INTRAVENOUS

## 2015-11-25 SURGICAL SUPPLY — 17 items
BALLN ARMADA 2.5X100X150 (BALLOONS) ×3
BALLN LUTONIX DCB 4X60X130 (BALLOONS) ×3
BALLOON ARMADA 2.5X100X150 (BALLOONS) IMPLANT
BALLOON LUTONIX DCB 4X60X130 (BALLOONS) IMPLANT
CATH PIG 70CM (CATHETERS) ×3 IMPLANT
DEVICE PRESTO INFLATION (MISCELLANEOUS) ×1 IMPLANT
DEVICE STARCLOSE SE CLOSURE (Vascular Products) ×1 IMPLANT
GLIDECATH F4/38/100ST (CATHETERS) ×1 IMPLANT
GLIDEWIRE ANGLED SS 035X260CM (WIRE) ×1 IMPLANT
PACK ANGIOGRAPHY (CUSTOM PROCEDURE TRAY) ×3 IMPLANT
SET INTRO CAPELLA COAXIAL (SET/KITS/TRAYS/PACK) ×3 IMPLANT
SHEATH BRITE TIP 5FRX11 (SHEATH) ×3 IMPLANT
SHEATH RAABE 6FR (SHEATH) ×1 IMPLANT
SYR MEDRAD MARK V 150ML (SYRINGE) ×3 IMPLANT
TUBING CONTRAST HIGH PRESS 72 (TUBING) ×3 IMPLANT
WIRE G V18X300CM (WIRE) ×1 IMPLANT
WIRE J 3MM .035X145CM (WIRE) ×3 IMPLANT

## 2015-11-25 NOTE — Op Note (Signed)
[facility] Percutaneous Study/Intervention Procedural Note   Date of Surgery: 11/25/2015  Surgeon: Katha Cabal  Pre-operative Diagnosis: Atherosclerotic occlusive disease bilateral lower extremities with rest pain of the right lower extremity  Post-operative diagnosis: Same  Procedure(s) Performed: 1. Introduction catheter into right lower extremity 3rd order catheter placement  2. Contrast injection right lower extremity for distal runoff  3. Percutaneous transluminal angioplasty right superficial femoral to 4 mm with Lutonix 4. Percutaneous transluminal angioplasty right posterior tibial and tibial peroneal trunk to 2.5 mm  5. Star close closure left common femoral arteriotomy  Anesthesia: Conscious sedation was administered under my direct supervision. IV Versed plus fentanyl were utilized. Continuous ECG, pulse oximetry and blood pressure was monitored throughout the entire procedure. Versed and fentanyl were utilized. Conscious sedation was for a total of 1 hour 30 minutes.  Sheath: 6 [place] Rabbe left common femoral artery  Contrast: 70 cc  Fluoroscopy Time: 6.4 minutes  Indications: [name] presents with increasing pain of the right lower extremity. This suggests the patient is having limb threatening ischemia. The risks and benefits are reviewed all questions answered patient agrees to proceed.  Procedure:[name] is a 65 y.o. y.o. female who was identified and appropriate procedural time out was performed. The patient was then placed supine on the table and prepped and draped in the usual sterile fashion.   Ultrasound was placed in the sterile sleeve and the left groin was evaluated the left common femoral artery was echolucent and pulsatile indicating patency. Image was recorded for the permanent record and under real-time visualization a microneedle was  inserted into the common femoral artery microwire followed by a micro-sheath. A J-wire was then advanced through the micro-sheath and a 5 [place] sheath was then inserted over a J-wire. J-wire was then advanced and a 5 French pigtail catheter was positioned at the level of L3.  An LAO view of the pelvis was obtained. Subsequently a pigtail catheter with the stiff angle Glidewire was used to cross the aortic bifurcation the catheter wire were advanced down into the right distal external iliac artery. Oblique view of the femoral bifurcation was then obtained and subsequently the wire was reintroduced and the pigtail catheter negotiated into the SFA representing third order catheter placement. Distal runoff was then performed.  3500 units of heparin was then given and allowed to circulate and a Rabbe sheath was advanced up and over the bifurcation and positioned in the femoral artery  Straight catheter and stiff angle Glidewire were then negotiated down into the distal popliteal. Catheter was then advanced. Hand injection contrast demonstrated the tibial anatomy in detail.  4 x 6 Lutonix balloon was used to angioplasty the SFA. The inflation was for 2 minutes at 14 atm. Follow-up imaging demonstrated excellent patency with preservation of the distal runoff.  The detector was then repositioned and the tibioperoneal trunk and posterior tibial were reimaged tandem greater than 90% stenoses were noted in the posterior tibial stenosis is noted in this area and after appropriate measurements are made a 2.5 x 100 balloon was used to angioplasty the superficial femoral and popliteal arteries. Inflations were to 12 atmospheres for 1 minutes. Follow-up imaging demonstrated patency with excellent result. Distal runoff was then reassessed and noted to be widely patent.   After review of these images the sheath is pulled into the left external iliac oblique of the common femoral is obtained and a Star close device  deployed. There no immediate Complications.  Findings:  The distal right aorta itself has diffuse disease but no hemodynamically significant lesions. The common and external iliac arteries are widely patent bilaterally.  The right common femoral is widely patent as is the profunda femoris.  The SFA does indeed have a significant stenosis in its midportion with a string sign noted..  The distal popliteal demonstrates patency. trifurcation is heavily diseased with occlusion of the anterior tibial just beyond its origin. The peroneal demonstrates a stenosis in its proximal portion and posterior tibial demonstrates multiple greater than 90% stenoses   Following angioplasty the posterior tibial  tibial now is in-line flow and looks quite nice. Angioplasty of the SFA at Hunter's canal yields an excellent result with less than 10% residual stenosis.  Summary: Successful recanalization right lower cavity for limb salvage   Disposition: Patient was taken to the recovery room in stable condition having tolerated the procedure well.  Schnier, Dolores Lory 11/25/2015,11:45 AM

## 2015-11-25 NOTE — H&P (Signed)
[facility] History & Physical Update  The patient was interviewed and re-examined.  The patient's previous History and Physical has been reviewed and is unchanged.  There is no change in the plan of care. We plan to proceed with the scheduled procedure.  [name], Dominique CraverGregory G, MD  11/25/2015, 9:46 AM

## 2015-11-26 ENCOUNTER — Encounter: Payer: Self-pay | Admitting: Vascular Surgery

## 2015-12-03 ENCOUNTER — Ambulatory Visit (INDEPENDENT_AMBULATORY_CARE_PROVIDER_SITE_OTHER): Payer: Medicare Other

## 2015-12-03 DIAGNOSIS — I480 Paroxysmal atrial fibrillation: Secondary | ICD-10-CM | POA: Diagnosis not present

## 2015-12-03 DIAGNOSIS — Z5181 Encounter for therapeutic drug level monitoring: Secondary | ICD-10-CM

## 2015-12-03 DIAGNOSIS — I2699 Other pulmonary embolism without acute cor pulmonale: Secondary | ICD-10-CM | POA: Diagnosis not present

## 2015-12-03 LAB — POCT INR: INR: 1.7

## 2015-12-05 ENCOUNTER — Other Ambulatory Visit: Payer: Self-pay | Admitting: Cardiovascular Disease

## 2015-12-05 NOTE — Telephone Encounter (Signed)
Please review for refill, Thank you. 

## 2015-12-16 ENCOUNTER — Other Ambulatory Visit: Payer: Self-pay | Admitting: Vascular Surgery

## 2015-12-16 ENCOUNTER — Other Ambulatory Visit
Admission: RE | Admit: 2015-12-16 | Discharge: 2015-12-16 | Disposition: A | Discharge status: Home / Self Care | Payer: Medicare Other | Source: Ambulatory Visit | Attending: Vascular Surgery | Admitting: Vascular Surgery

## 2015-12-16 DIAGNOSIS — I739 Peripheral vascular disease, unspecified: Secondary | ICD-10-CM | POA: Insufficient documentation

## 2015-12-16 LAB — CREATININE, SERUM
Creatinine, Ser: 0.66 mg/dL (ref 0.44–1.00)
GFR calc non Af Amer: >60 mL/min (ref >60)

## 2015-12-16 LAB — BUN: BUN: 23 mg/dL — AB (ref 6–20)

## 2015-12-17 ENCOUNTER — Inpatient Hospital Stay
Admission: RE | Admit: 2015-12-17 | Discharge: 2015-12-20 | DRG: 271 | Disposition: A | Discharge status: Home / Self Care | Payer: Medicare Other | Source: Ambulatory Visit | Attending: Vascular Surgery | Admitting: Vascular Surgery

## 2015-12-17 ENCOUNTER — Encounter
Admission: RE | Disposition: A | Discharge status: Home / Self Care | Payer: Self-pay | Source: Ambulatory Visit | Attending: Vascular Surgery

## 2015-12-17 DIAGNOSIS — Z79899 Other long term (current) drug therapy: Secondary | ICD-10-CM

## 2015-12-17 DIAGNOSIS — I110 Hypertensive heart disease with heart failure: Secondary | ICD-10-CM | POA: Diagnosis present

## 2015-12-17 DIAGNOSIS — I70201 Unspecified atherosclerosis of native arteries of extremities, right leg: Secondary | ICD-10-CM | POA: Diagnosis present

## 2015-12-17 DIAGNOSIS — L97529 Non-pressure chronic ulcer of other part of left foot with unspecified severity: Secondary | ICD-10-CM | POA: Diagnosis present

## 2015-12-17 DIAGNOSIS — Z833 Family history of diabetes mellitus: Secondary | ICD-10-CM | POA: Diagnosis not present

## 2015-12-17 DIAGNOSIS — Z8249 Family history of ischemic heart disease and other diseases of the circulatory system: Secondary | ICD-10-CM | POA: Diagnosis not present

## 2015-12-17 DIAGNOSIS — Z955 Presence of coronary angioplasty implant and graft: Secondary | ICD-10-CM

## 2015-12-17 DIAGNOSIS — I998 Other disorder of circulatory system: Secondary | ICD-10-CM | POA: Diagnosis present

## 2015-12-17 DIAGNOSIS — Z7982 Long term (current) use of aspirin: Secondary | ICD-10-CM

## 2015-12-17 DIAGNOSIS — Z823 Family history of stroke: Secondary | ICD-10-CM

## 2015-12-17 DIAGNOSIS — E785 Hyperlipidemia, unspecified: Secondary | ICD-10-CM | POA: Diagnosis present

## 2015-12-17 DIAGNOSIS — I70245 Atherosclerosis of native arteries of left leg with ulceration of other part of foot: Secondary | ICD-10-CM | POA: Diagnosis present

## 2015-12-17 DIAGNOSIS — I5032 Chronic diastolic (congestive) heart failure: Secondary | ICD-10-CM | POA: Diagnosis present

## 2015-12-17 DIAGNOSIS — I2510 Atherosclerotic heart disease of native coronary artery without angina pectoris: Secondary | ICD-10-CM | POA: Diagnosis present

## 2015-12-17 DIAGNOSIS — I70222 Atherosclerosis of native arteries of extremities with rest pain, left leg: Secondary | ICD-10-CM | POA: Diagnosis present

## 2015-12-17 DIAGNOSIS — E11621 Type 2 diabetes mellitus with foot ulcer: Secondary | ICD-10-CM | POA: Diagnosis present

## 2015-12-17 DIAGNOSIS — Z7901 Long term (current) use of anticoagulants: Secondary | ICD-10-CM

## 2015-12-17 DIAGNOSIS — I739 Peripheral vascular disease, unspecified: Secondary | ICD-10-CM

## 2015-12-17 DIAGNOSIS — M79673 Pain in unspecified foot: Secondary | ICD-10-CM | POA: Diagnosis present

## 2015-12-17 HISTORY — PX: PERIPHERAL VASCULAR CATHETERIZATION: SHX172C

## 2015-12-17 LAB — PROTIME-INR
INR: 1.45
PROTHROMBIN TIME: 17.7 s — AB (ref 11.4–15.0)

## 2015-12-17 LAB — GLUCOSE, CAPILLARY: Glucose-Capillary: 216 mg/dL — ABNORMAL HIGH (ref 65–99)

## 2015-12-17 LAB — HEPARIN LEVEL (UNFRACTIONATED): HEPARIN UNFRACTIONATED: 0.43 [IU]/mL (ref 0.30–0.70)

## 2015-12-17 SURGERY — LOWER EXTREMITY ANGIOGRAPHY
Anesthesia: Moderate Sedation | Laterality: Left

## 2015-12-17 MED ORDER — HEPARIN SODIUM (PORCINE) 1000 UNIT/ML IJ SOLN
INTRAMUSCULAR | Status: DC | PRN
  Administered 2015-12-17: 5000 [IU] via INTRAVENOUS
  Administered 2015-12-17: 1000 [IU] via INTRAVENOUS

## 2015-12-17 MED ORDER — ROSUVASTATIN CALCIUM 20 MG PO TABS
40.0000 mg | ORAL_TABLET | Freq: Every day | ORAL | Status: DC
  Administered 2015-12-18 – 2015-12-20 (×3): 40 mg via ORAL
  Filled 2015-12-17 (×3): qty 2

## 2015-12-17 MED ORDER — LINAGLIPTIN 5 MG PO TABS
5.0000 mg | ORAL_TABLET | Freq: Every day | ORAL | Status: DC
  Administered 2015-12-18 – 2015-12-20 (×3): 5 mg via ORAL
  Filled 2015-12-17 (×3): qty 1

## 2015-12-17 MED ORDER — ATENOLOL 50 MG PO TABS: 50.0000 mg | ORAL_TABLET | Freq: Every day | ORAL | Status: DC

## 2015-12-17 MED ORDER — POLYETHYLENE GLYCOL 3350 17 G PO PACK: 17.0000 g | PACK | Freq: Every day | ORAL | Status: DC | PRN

## 2015-12-17 MED ORDER — LIDOCAINE-EPINEPHRINE (PF) 1 %-1:200000 IJ SOLN
INTRAMUSCULAR | Status: DC | PRN
  Administered 2015-12-17: 10 mL via INTRADERMAL

## 2015-12-17 MED ORDER — ASPIRIN EC 81 MG PO TBEC
81.0000 mg | DELAYED_RELEASE_TABLET | Freq: Every day | ORAL | Status: DC
  Administered 2015-12-18 – 2015-12-20 (×3): 81 mg via ORAL
  Filled 2015-12-17 (×2): qty 1

## 2015-12-17 MED ORDER — ONDANSETRON HCL 4 MG/2ML IJ SOLN
4.0000 mg | Freq: Four times a day (QID) | INTRAMUSCULAR | Status: DC | PRN
  Administered 2015-12-17: 4 mg via INTRAVENOUS
  Filled 2015-12-17: qty 2

## 2015-12-17 MED ORDER — WARFARIN SODIUM 5 MG PO TABS
5.0000 mg | ORAL_TABLET | Freq: Every day | ORAL | Status: DC
  Administered 2015-12-17: 5 mg via ORAL
  Filled 2015-12-17 (×2): qty 1

## 2015-12-17 MED ORDER — WARFARIN - PHYSICIAN DOSING INPATIENT
Freq: Every day | Status: DC
  Administered 2015-12-19: 18:00:00

## 2015-12-17 MED ORDER — MIDAZOLAM HCL 5 MG/5ML IJ SOLN
INTRAMUSCULAR | Status: AC
  Filled 2015-12-17: qty 5

## 2015-12-17 MED ORDER — ALUM & MAG HYDROXIDE-SIMETH 200-200-20 MG/5ML PO SUSP: 15.0000 mL | ORAL | Status: DC | PRN

## 2015-12-17 MED ORDER — LOSARTAN POTASSIUM 25 MG PO TABS
12.5000 mg | ORAL_TABLET | Freq: Every day | ORAL | Status: DC
  Administered 2015-12-17 – 2015-12-19 (×3): 12.5 mg via ORAL
  Filled 2015-12-17 (×3): qty 1

## 2015-12-17 MED ORDER — HEPARIN (PORCINE) IN NACL 2-0.9 UNIT/ML-% IJ SOLN
INTRAMUSCULAR | Status: AC
  Filled 2015-12-17: qty 1000

## 2015-12-17 MED ORDER — ISOSORBIDE MONONITRATE ER 30 MG PO TB24
30.0000 mg | ORAL_TABLET | Freq: Two times a day (BID) | ORAL | Status: DC
  Administered 2015-12-17 – 2015-12-19 (×3): 30 mg via ORAL
  Filled 2015-12-17 (×4): qty 1

## 2015-12-17 MED ORDER — MIDAZOLAM HCL 2 MG/2ML IJ SOLN
INTRAMUSCULAR | Status: DC | PRN
  Administered 2015-12-17 (×3): 1 mg via INTRAVENOUS
  Administered 2015-12-17: 2 mg via INTRAVENOUS
  Administered 2015-12-17 (×4): 1 mg via INTRAVENOUS

## 2015-12-17 MED ORDER — FENTANYL CITRATE (PF) 100 MCG/2ML IJ SOLN
INTRAMUSCULAR | Status: AC
  Filled 2015-12-17: qty 2

## 2015-12-17 MED ORDER — ACETAMINOPHEN 325 MG PO TABS: 325.0000 mg | ORAL_TABLET | ORAL | Status: DC | PRN

## 2015-12-17 MED ORDER — METHYLPREDNISOLONE SODIUM SUCC 125 MG IJ SOLR: 125.0000 mg | INTRAMUSCULAR | Status: DC | PRN

## 2015-12-17 MED ORDER — ASPIRIN 325 MG PO TABS
325.0000 mg | ORAL_TABLET | Freq: Once | ORAL | Status: AC
  Administered 2015-12-19: 325 mg via ORAL
  Filled 2015-12-17: qty 1

## 2015-12-17 MED ORDER — FUROSEMIDE 20 MG PO TABS
20.0000 mg | ORAL_TABLET | Freq: Every day | ORAL | Status: DC | PRN
  Filled 2015-12-17: qty 1

## 2015-12-17 MED ORDER — IOPAMIDOL (ISOVUE-300) INJECTION 61%
INTRAVENOUS | Status: DC | PRN
  Administered 2015-12-17: 70 mL via INTRA_ARTERIAL

## 2015-12-17 MED ORDER — ATENOLOL 25 MG PO TABS
50.0000 mg | ORAL_TABLET | Freq: Every day | ORAL | Status: DC
  Administered 2015-12-18 – 2015-12-20 (×3): 50 mg via ORAL
  Filled 2015-12-17: qty 2
  Filled 2015-12-17: qty 1
  Filled 2015-12-17 (×2): qty 2

## 2015-12-17 MED ORDER — HYDROMORPHONE HCL 1 MG/ML IJ SOLN: 1.0000 mg | Freq: Once | INTRAMUSCULAR | Status: DC

## 2015-12-17 MED ORDER — INSULIN ASPART 100 UNIT/ML ~~LOC~~ SOLN
0.0000 [IU] | Freq: Every day | SUBCUTANEOUS | Status: DC
  Administered 2015-12-17: 2 [IU] via SUBCUTANEOUS
  Filled 2015-12-17: qty 2

## 2015-12-17 MED ORDER — LOSARTAN POTASSIUM 50 MG PO TABS: 50.0000 mg | ORAL_TABLET | Freq: Every day | ORAL | Status: DC

## 2015-12-17 MED ORDER — DEXTROSE 5 % IV SOLN
1.5000 g | INTRAVENOUS | Status: AC
  Administered 2015-12-17: 1.5 g via INTRAVENOUS

## 2015-12-17 MED ORDER — MORPHINE SULFATE (PF) 4 MG/ML IV SOLN: 2.0000 mg | INTRAVENOUS | Status: DC | PRN

## 2015-12-17 MED ORDER — DOCUSATE SODIUM 100 MG PO CAPS
100.0000 mg | ORAL_CAPSULE | Freq: Every day | ORAL | Status: DC
  Filled 2015-12-17 (×2): qty 1

## 2015-12-17 MED ORDER — ZOLPIDEM TARTRATE 5 MG PO TABS
5.0000 mg | ORAL_TABLET | Freq: Every evening | ORAL | Status: DC | PRN
  Administered 2015-12-17 – 2015-12-19 (×3): 5 mg via ORAL
  Filled 2015-12-17 (×3): qty 1

## 2015-12-17 MED ORDER — HEPARIN SODIUM (PORCINE) 1000 UNIT/ML IJ SOLN
INTRAMUSCULAR | Status: AC
  Filled 2015-12-17: qty 1

## 2015-12-17 MED ORDER — ONDANSETRON HCL 4 MG/2ML IJ SOLN: 4.0000 mg | Freq: Four times a day (QID) | INTRAMUSCULAR | Status: DC | PRN

## 2015-12-17 MED ORDER — LIDOCAINE HCL (PF) 1 % IJ SOLN
INTRAMUSCULAR | Status: AC
  Filled 2015-12-17: qty 30

## 2015-12-17 MED ORDER — FAMOTIDINE 20 MG PO TABS: 40.0000 mg | ORAL_TABLET | ORAL | Status: DC | PRN

## 2015-12-17 MED ORDER — FENTANYL CITRATE (PF) 100 MCG/2ML IJ SOLN
INTRAMUSCULAR | Status: DC | PRN
  Administered 2015-12-17: 50 ug via INTRAVENOUS
  Administered 2015-12-17: 25 ug via INTRAVENOUS
  Administered 2015-12-17 (×4): 50 ug via INTRAVENOUS
  Administered 2015-12-17: 25 ug via INTRAVENOUS

## 2015-12-17 MED ORDER — ACETAMINOPHEN 325 MG RE SUPP
325.0000 mg | RECTAL | Status: DC | PRN
  Filled 2015-12-17: qty 2

## 2015-12-17 MED ORDER — HEPARIN (PORCINE) IN NACL 100-0.45 UNIT/ML-% IJ SOLN
1150.0000 [IU]/h | INTRAMUSCULAR | Status: DC
  Administered 2015-12-17: 900 [IU]/h via INTRAVENOUS
  Administered 2015-12-18 – 2015-12-19 (×2): 1000 [IU]/h via INTRAVENOUS
  Filled 2015-12-17 (×6): qty 250

## 2015-12-17 MED ORDER — INSULIN ASPART 100 UNIT/ML ~~LOC~~ SOLN
0.0000 [IU] | Freq: Three times a day (TID) | SUBCUTANEOUS | Status: DC
  Filled 2015-12-17 (×2): qty 2

## 2015-12-17 MED ORDER — NORETHIN ACE-ETH ESTRAD-FE 1.5-30 MG-MCG PO TABS: 1.0000 | ORAL_TABLET | Freq: Every day | ORAL | Status: DC

## 2015-12-17 MED ORDER — TRAMADOL HCL 50 MG PO TABS
50.0000 mg | ORAL_TABLET | Freq: Four times a day (QID) | ORAL | Status: DC | PRN
  Administered 2015-12-18: 50 mg via ORAL
  Filled 2015-12-17: qty 1

## 2015-12-17 MED ORDER — POTASSIUM CHLORIDE ER 10 MEQ PO TBCR
10.0000 meq | EXTENDED_RELEASE_TABLET | Freq: Two times a day (BID) | ORAL | Status: DC | PRN
  Filled 2015-12-17: qty 1

## 2015-12-17 MED ORDER — GUAIFENESIN ER 600 MG PO TB12
600.0000 mg | ORAL_TABLET | Freq: Every day | ORAL | Status: DC
  Administered 2015-12-18 – 2015-12-20 (×3): 600 mg via ORAL
  Filled 2015-12-17 (×3): qty 1

## 2015-12-17 MED ORDER — SODIUM CHLORIDE 0.9 % IV SOLN
INTRAVENOUS | Status: DC
  Administered 2015-12-17: 12:00:00 via INTRAVENOUS

## 2015-12-17 MED ORDER — SERTRALINE HCL 50 MG PO TABS
50.0000 mg | ORAL_TABLET | Freq: Every day | ORAL | Status: DC
  Administered 2015-12-17 – 2015-12-19 (×3): 50 mg via ORAL
  Filled 2015-12-17 (×3): qty 1

## 2015-12-17 SURGICAL SUPPLY — 35 items
BALLN ULTRVRSE 2.5X300X150 (BALLOONS) ×3
BALLN ULTRVRSE 2.5X40X150 (BALLOONS) ×3
BALLN ULTRVRSE 2X300X150 (BALLOONS) ×3
BALLN ULTRVRSE 2X300X150 OTW (BALLOONS) ×2
BALLN ULTRVRSE 3X300X150 (BALLOONS) ×3
BALLN ULTRVRSE 3X300X150 OTW (BALLOONS) ×2
BALLOON ULTRVRSE 150X40X2.5 (BALLOONS) IMPLANT
BALLOON ULTRVRSE 2.5X300X150 (BALLOONS) IMPLANT
BALLOON ULTRVRSE 2X300X150 OTW (BALLOONS) IMPLANT
BALLOON ULTRVRSE 3X300X150 OTW (BALLOONS) IMPLANT
CATH CROSSER S6 154CM (CATHETERS) ×1 IMPLANT
CATH CXI SUPP ST 2.6FR 150CM (MICROCATHETER) ×1 IMPLANT
CATH KA2 5FR 65CM (CATHETERS) ×1 IMPLANT
CATH PIG 70CM (CATHETERS) ×3 IMPLANT
CATH USHER ST 83CM (CATHETERS) ×1 IMPLANT
CATH USHER TPER 130CM (CATHETERS) ×1 IMPLANT
DEVICE CLOSURE MYNXGRIP 6/7F (Vascular Products) ×1 IMPLANT
DEVICE TORQUE (MISCELLANEOUS) ×1 IMPLANT
GLIDECATH 4FR STR (CATHETERS) ×1 IMPLANT
GLIDECATH F4/38/100ST (CATHETERS) ×1 IMPLANT
GUIDEWIRE ANGLED .035 180CM (WIRE) ×1 IMPLANT
GUIDEWIRE PFTE-COATED .018X300 (WIRE) ×1 IMPLANT
KIT FLOWMATE PROCEDURAL (MISCELLANEOUS) ×1 IMPLANT
NDL ENTRY 21GA 7CM ECHOTIP (NEEDLE) IMPLANT
NEEDLE ENTRY 21GA 7CM ECHOTIP (NEEDLE) ×3 IMPLANT
PACK ANGIOGRAPHY (CUSTOM PROCEDURE TRAY) ×3 IMPLANT
SET INTRO CAPELLA COAXIAL (SET/KITS/TRAYS/PACK) ×3 IMPLANT
SHEATH BRITE TIP 5FRX11 (SHEATH) ×3 IMPLANT
SHEATH BRITE TIP 6FRX11 (SHEATH) ×1 IMPLANT
SYR MEDRAD MARK V 150ML (SYRINGE) ×3 IMPLANT
TUBING CONTRAST HIGH PRESS 72 (TUBING) ×3 IMPLANT
VALVE HEMO TOUHY BORST Y (VALVE) ×1 IMPLANT
WIRE G 018X200 V18 (WIRE) ×1 IMPLANT
WIRE G V18X300CM (WIRE) ×1 IMPLANT
WIRE J 3MM .035X145CM (WIRE) ×4 IMPLANT

## 2015-12-17 NOTE — H&P (Signed)
[facility] History & Physical Update  The patient was interviewed and re-examined.  The patient's previous History and Physical has been reviewed and is unchanged.  There is no change in the plan of care. We plan to proceed with the scheduled procedure.  Charlette Hennings, Latina CraverGregory G, MD  12/17/2015, 10:58 AM

## 2015-12-17 NOTE — Op Note (Signed)
[facility] Percutaneous Study/Intervention Procedural Note   Date of Surgery: 12/17/2015  Surgeon:  Katha Cabal, MD.  Pre-operative Diagnosis: Atherosclerotic occlusive disease bilateral lower extremities with ulceration of the left foot and ischemic rest pain  Post-operative diagnosis: Same  Procedure(s) Performed: 1. Introduction catheter into left lower extremity 3rd order catheter placement  2. Contrast injection left lower extremity for distal runoff   3. Crosser atherectomy of the posterior tibial artery 4.  Percutaneous transluminal angioplasty left posterior tibial artery             5.   Minx closure left common femoral arteriotomy  Anesthesia: Conscious sedation was administered under my direct supervision. IV Versed plus fentanyl were utilized. Continuous ECG, pulse oximetry and blood pressure was monitored throughout the entire procedure. Versed and fentanyl were utilized.  Conscious sedation  for a total of 150 minutes.  Sheath: 6 French left common femoral antegrade puncture  Contrast: 70 cc  Fluoroscopy Time: 21.8 minutes  Indications: Gwenlyn Perking [place] presents with severe pain which she now rates 10 out of 10 as well as a nonhealing wound of the left foot. She has known atherosclerotic occlusive disease which does not appear to have a surgical bypass option. She is therefore returning to special procedures for attempts at limb salvage. The risks and benefits are reviewed all questions answered patient agrees to proceed.  Procedure: DELCENIA INMAN is a 65 y.o. y.o. female who was identified and appropriate procedural time out was performed. The patient was then placed supine on the table and prepped and draped in the usual sterile fashion.   Ultrasound was placed in the sterile sleeve and the left groin was evaluated the left common femoral artery was echolucent and pulsatile  indicating patency.  Image was recorded for the permanent record and under real-time visualization a microneedle was inserted into the common femoral artery microwire followed by a micro-sheath.  A J-wire was then advanced through the micro-sheath which coursed down into the profunda femoris.  A  6 French sheath was then inserted over a J-wire. The sheath was then pulled back while puffing contrast until the tip was within the common femoral and subsequently a floppy Glidewire was advanced similar to a buddy wire and under fluoroscopy negotiated into the superficial femoral artery. At this point the dilator was placed over the floppy Glidewire and the J-wire was removed and the sheath was advanced and was well seated within the superficial femoral artery.  Floppy Glidewire and straight catheter were then negotiated down to the below-knee popliteal and hand injection contrast in multiple views was performed to try to delineate the tibial anatomy. The previous angiogram of the left lower extremity did not yield adequate images and so multiple different oblique views were obtained to try to define where the occluded posterior tibial and anterior tibial would actually course. Of significance on these more magnified and intense views the lateral and medial plantar branches were visualized (these were non-visualized on the initial left lower extremity angiogram) thus providing a distal target and allowing to continue with the procedure.  5000 units of heparin was then given and allowed to circulate for several minutes.   The S6 Crosser catheter was then prepped on the field and a straight usher catheter was advanced into the cul-de-sac of the posterior tibial under magnified imaging in the AP projection. Using the Crosser catheter the occlusion was negotiated. The S6 was manipulated down to the approximate level of the medial  plantar. The atherectomy past that was created by the Crosser then allowed for V-18 wire  to be negotiated down to the foot. Using a variety of different views and 8 cc X I catheter the final segment was crossed and the wire and catheter negotiated into the plantar vessels. Hand injection contrast provided intraluminal imaging.   A 2 x 30 all traverse balloon was used to angioplasty the proximal plantar artery as well as the posterior tibial. Inflations were to 12 atmospheres for 2 minutes. Subsequently a 2.5 x 30 balloon was utilized to angioplasty the midportion of the posterior tibial again the inflation was for 9:58 to 12 atm. Thirdly, a 3 x 10 balloon was used to angioplasty the proximal portion of the posterior tibial. Follow-up imaging demonstrated an area in the midportion which was still string sign as well as incomplete recanalization at the level of the plantar vessels. Therefore a 2.5 x 4 balloon was used to readdress these 2 locations the mid lesion required one inflation this time to 16 atm for 2 minutes at the level lateral plantar and medial plantar vessels 2 serial inflations to 12 atm were used for 2 minutes each.  Follow-up imaging demonstrated patency of the posterior tibial artery and in fact the flow within the posterior tibial into the plantar branches is now much more rapid than the flow within the peritoneal with was her single-vessel dominant runoff to the foot prior to the intervention.  After review of these images the sheath is pulled into the left common femoral is obtained and a minx device deployed. There no immediate Complications.  Findings: The left common femoral is widely patent as is the profunda femoris.  The SFA and popliteal is patent as well. The trifurcation is heavily diseased and there is occlusion of the anterior tibial and posterior tibial within a few millimeters of their origin and they remain occluded throughout their entire course. Under multiple different magnified views a true dorsalis pedis is not identified. Under magnified views laterally  the plantar vessels are identified allowing for distal target.  Crosser atherectomy is then performed with success and the occluded segment of posterior tibial is recanalized from the origin of the posterior tibial down to the lateral plantar vessels. Following this serial angioplasties performed regaining distally at 2 mm and ending proximally 3 mm. At the conclusion there is a patent posterior tibial with rapid flow of contrast as described above.  SUMMARY: Successful recanalization of the posterior tibial now filling the lateral plantar vessels which are filling the pedal arch. This should provide adequate arterial inflow in conjunction with the peritoneal to not only provide relief of the ischemic rest pain but also wound healing.  Disposition: Patient was taken to the recovery room in stable condition having tolerated the procedure well.  Schnier, Dolores Lory 12/17/2015,8:16 PM

## 2015-12-17 NOTE — Progress Notes (Signed)
Received pt from [facility] to Rm 211. Pt AOx4. VSS. No signs of acute distress. IV access intact and saline locked with Heparin gtt. Medications administered (See MAR). Admission completed. Assessment completed. DVT prophylaxis assessed and completed.   Education provided on call bell, bed alarm, telephone and IV/IV Pole/IV Alarms. Education presented on use of [employer] as a Network engineerpatient/nurse communication tool. White Board was completed and updated PRN.   Dietary specification confirmed.   Family at bedside. Pt resting comfortably and quietly w/bed in low and locked position and call bell and telephone within reach. Will continue to monitor.

## 2015-12-17 NOTE — Progress Notes (Addendum)
ANTICOAGULATION CONSULT NOTE - Initial Consult  Pharmacy Consult for Heparin  Indication: S/P arteriogram  Allergies  Allergen Reactions  . Metformin And Related Diarrhea  . Biaxin [Clarithromycin] Itching  . Atorvastatin Cough  . Codeine Nausea Only and Other (See Comments)    Constipates; doesn't like the way it makes her feel   . Oxycodone Hcl Other (See Comments)    01/10/15 patient  tolerated 8 doses of oxycodone with no allergy symptoms 7/12 noted allergy including, Palpitations, difficulty breathing.    Patient Measurements: Height: 5\' 9"  (175.3 cm) Weight: 180 lb (81.647 kg) IBW/kg (Calculated) : 66.2 Heparin Dosing Weight: 81.6 kg   Vital Signs: Temp: 98 F (36.7 C) (05/10 1047) Temp Source: Oral (05/10 1047) BP: 125/57 mmHg (05/10 1530) Pulse Rate: 56 (05/10 1530)  Labs:  Recent Labs  12/16/15 1336  CREATININE 0.66    Estimated Creatinine Clearance: 81.2 mL/min (by C-G formula based on Cr of 0.66).   Medical History: Past Medical History  Diagnosis Date  . Type 2 diabetes mellitus (HCC)   . Hyperlipidemia   . Hypertension   . Coronary artery disease     Status post coronary stenting approximately 10 years ago  . Peripheral artery disease (HCC)   . Acute on chronic diastolic CHF (congestive heart failure) (HCC) 01/24/2015  . Depression   . Anxiety   . Anemia     Medications:  Scheduled:  . aspirin EC  81 mg Oral Daily  . aspirin  325 mg Oral Once  . atenolol  50 mg Oral Daily  . atenolol  50 mg Oral Daily  . [START ON 12/18/2015] docusate sodium  100 mg Oral Daily  . guaiFENesin  600 mg Oral Daily  . isosorbide mononitrate  30 mg Oral BID  . linagliptin  5 mg Oral Daily  . losartan  12.5 mg Oral QHS  . losartan  50 mg Oral QHS  . norethindrone-ethinyl estradiol-iron  1 tablet Oral Daily  . rosuvastatin  40 mg Oral Daily  . sertraline  50 mg Oral QHS  . warfarin  5 mg Oral Daily    Assessment: Pharmacy consulted to dose heparin in this  65 year old female S/P arteriogram.  Pt received heparin 5000 X 1 @ 12:34 and additional heparin 1000 units X 1 @ 13:56.  Will not bolus this pt, per Dr Gilda CreaseSchnier.  CrCl = 81.2 ml/min INR (5/10) = 1.45  Pt also on warfarin 5 mg PO daily.    Goal of Therapy:  Goal HL :  0.1 - 0.25  Monitor platelets by anticoagulation protocol: Yes   Plan:  Will start Heparin drip @ 800 units/hr.   Will draw 1st HL 6 hrs after start of drip.  Will restart warfarin 5 mg PO daily and check INR daily.     Robbins,Jason D 12/17/2015,3:38 PM  First heparin level 12/17/2015 at 2225 was 0.43, supratherapeutic. Decreased rate to 700 units/hr. Will recheck level in 6 hours.  Miaya Lafontant A. [place], VermontPharm.D., BCPS Clinical Pharmacist 12/18/2015 0002

## 2015-12-18 ENCOUNTER — Encounter: Payer: Self-pay | Admitting: Vascular Surgery

## 2015-12-18 ENCOUNTER — Telehealth: Payer: Self-pay | Admitting: Cardiovascular Disease

## 2015-12-18 LAB — GLUCOSE, CAPILLARY
GLUCOSE-CAPILLARY: 156 mg/dL — AB (ref 65–99)
GLUCOSE-CAPILLARY: 228 mg/dL — AB (ref 65–99)
GLUCOSE-CAPILLARY: 223 mg/dL — AB (ref 65–99)
Glucose-Capillary: 237 mg/dL — ABNORMAL HIGH (ref 65–99)

## 2015-12-18 LAB — PROTIME-INR
INR: 1.29
Prothrombin Time: 16.2 seconds — ABNORMAL HIGH (ref 11.4–15.0)

## 2015-12-18 LAB — HEPARIN LEVEL (UNFRACTIONATED)
HEPARIN UNFRACTIONATED: 0.31 [IU]/mL (ref 0.30–0.70)
Heparin Unfractionated: 0.13 IU/mL — ABNORMAL LOW (ref 0.30–0.70)
Heparin Unfractionated: 0.42 IU/mL (ref 0.30–0.70)

## 2015-12-18 MED ORDER — WARFARIN SODIUM 5 MG PO TABS: 10.0000 mg | ORAL_TABLET | ORAL | Status: DC

## 2015-12-18 MED ORDER — WARFARIN SODIUM 5 MG PO TABS: 7.5000 mg | ORAL_TABLET | Freq: Every day | ORAL | Status: DC

## 2015-12-18 MED ORDER — HEPARIN BOLUS VIA INFUSION
2400.0000 [IU] | Freq: Once | INTRAVENOUS | Status: AC
  Administered 2015-12-18: 2400 [IU] via INTRAVENOUS
  Filled 2015-12-18: qty 2400

## 2015-12-18 MED ORDER — WARFARIN SODIUM 5 MG PO TABS
7.5000 mg | ORAL_TABLET | ORAL | Status: DC
  Administered 2015-12-19 – 2015-12-20 (×2): 7.5 mg via ORAL
  Filled 2015-12-18 (×2): qty 2

## 2015-12-18 MED ORDER — WARFARIN SODIUM 5 MG PO TABS: 7.5000 mg | ORAL_TABLET | ORAL | Status: DC

## 2015-12-18 MED ORDER — WARFARIN SODIUM 5 MG PO TABS
10.0000 mg | ORAL_TABLET | Freq: Once | ORAL | Status: AC
  Administered 2015-12-18: 10 mg via ORAL
  Filled 2015-12-18: qty 2

## 2015-12-18 NOTE — Telephone Encounter (Signed)
Returned call to pt & she stated she was in the hospital post procedure that she held her Coumadin. She stated that the Physician was unsure of her dose & needed [facility] to call the pt & tell her the dose & they would verify with pt at the physician next round. Read the pt her dose & she verbalized it was what she was taking & had reported. Also, instructed pt to have the Physician call the [facility] clinic for direct communication for any information & she stated she would let them know to contact us for more details if needed. The pt states she may go home today & [facility] once discharged.

## 2015-12-18 NOTE — Progress Notes (Signed)
Foley was removed per MD order.

## 2015-12-18 NOTE — Telephone Encounter (Signed)
New Message:  Pt is calling in stating that she is in the hospital for surgery for an ulcer that was on her leg. She stated that she was only suppose to be in the hospital for one day and now she is having to stay longer due to her coumadin levels being 1.2 but her coumadin dosage was also adjusted. Please f/u with the pt and advise.

## 2015-12-18 NOTE — Progress Notes (Addendum)
[facility]  Daily Progress Note   Subjective: 1 Day Post-Op: Introduction catheter into left lower extremity 3rd order catheter placement, Contrast injection left lower extremity for distal runoff, Crosser atherectomy of the posterior tibial artery, Percutaneous transluminal angioplasty left posterior tibial artery and Minx closure left common femoral arteriotomy.  Patient with some left lower extremity soreness. Otherwise without complaint no issues last night. INR this AM 1.29  Objective: Filed Vitals:   12/17/15 1610 12/17/15 1630 12/17/15 2039 12/18/15 0541  BP: 169/67 164/67 115/48 130/76  Pulse: 56 56 56 55  Temp:   98.1 F (36.7 C) 98 F (36.7 C)  TempSrc:   Oral Oral  Resp: 20 16 18 16   Height:      Weight:      SpO2: 97% 98% 100% 99%    Intake/Output Summary (Last 24 hours) at 12/18/15 0829 Last data filed at 12/18/15 0541  Gross per 24 hour  Intake    699 ml  Output    900 ml  Net   -201 ml   Physical Exam: A&Ox3, NAD CV: RRR Pulmonary: CTA Bilaterally Abdomen: Soft, Nontender, Nondistended Vascular:  Left Lower Extremity: warm, nontender, palp PT pulses   Laboratory: CBC    Component Value Date/Time   WBC 8.7 01/10/2015 0545   HGB 9.0* 01/10/2015 0545   HCT 26.7* 01/10/2015 0545   PLT 227 01/10/2015 0545    BMET    Component Value Date/Time   NA 135 11/24/2015 1051   NA 136 09/26/2013 0928   K 5.1 11/24/2015 1051   K 4.5 09/26/2013 0928   CL 103 11/24/2015 1051   CL 104 09/26/2013 0928   CO2 26 11/24/2015 1051   CO2 31 09/26/2013 0928   GLUCOSE 192* 11/24/2015 1051   GLUCOSE 145* 09/26/2013 0928   BUN 23* 12/16/2015 1336   BUN 19* 09/26/2013 0928   CREATININE 0.66 12/16/2015 1336   CREATININE 0.85 09/26/2013 0928   CALCIUM 9.6 11/24/2015 1051   CALCIUM 9.8 09/26/2013 0928   GFRNONAA >60 12/16/2015 1336   GFRNONAA >60 09/26/2013 0928   GFRAA >60 12/16/2015 1336   GFRAA >60 09/26/2013 0928     Assessment/Planning: 65 year old female s/p left lower extremity angiogram - stable 1) Patient cant not be discharged until INR is >1.8 2) OOB / ambulation  Longs Drug StoresKimberly Jamarr Treinen PA-C 12/18/2015 8:29 AM

## 2015-12-18 NOTE — Progress Notes (Signed)
ANTICOAGULATION CONSULT NOTE - Initial Consult  Pharmacy Consult for Heparin  Indication: S/P arteriogram  Allergies  Allergen Reactions  . Metformin And Related Diarrhea  . Biaxin [Clarithromycin] Itching  . Atorvastatin Cough  . Codeine Nausea Only and Other (See Comments)    Constipates; doesn't like the way it makes her feel   . Oxycodone Hcl Other (See Comments)    01/10/15 patient  tolerated 8 doses of oxycodone with no allergy symptoms 7/12 noted allergy including, Palpitations, difficulty breathing.    Patient Measurements: Height: 5\' 9"  (175.3 cm) Weight: 180 lb (81.647 kg) IBW/kg (Calculated) : 66.2 Heparin Dosing Weight: 81.6 kg   Vital Signs: Temp: 98 F (36.7 C) (05/11 0541) Temp Source: Oral (05/11 0541) BP: 134/51 mmHg (05/11 1415) Pulse Rate: 61 (05/11 1415)  Labs:  Recent Labs  12/16/15 1336 12/17/15 1759 12/17/15 2225 12/18/15 0714 12/18/15 1459  LABPROT  --  17.7*  --  16.2*  --   INR  --  1.45  --  1.29  --   HEPARINUNFRC  --   --  0.43 0.31 0.13*  CREATININE 0.66  --   --   --   --     Estimated Creatinine Clearance: 81.2 mL/min (by C-G formula based on Cr of 0.66).   Medical History: Past Medical History  Diagnosis Date  . Type 2 diabetes mellitus (HCC)   . Hyperlipidemia   . Hypertension   . Coronary artery disease     Status post coronary stenting approximately 10 years ago  . Peripheral artery disease (HCC)   . Acute on chronic diastolic CHF (congestive heart failure) (HCC) 01/24/2015  . Depression   . Anxiety   . Anemia     Medications:  Scheduled:  . aspirin EC  81 mg Oral Daily  . aspirin  325 mg Oral Once  . atenolol  50 mg Oral Daily  . docusate sodium  100 mg Oral Daily  . guaiFENesin  600 mg Oral Daily  . heparin  2,400 Units Intravenous Once  . insulin aspart  0-5 Units Subcutaneous QHS  . insulin aspart  0-9 Units Subcutaneous TID WC  . isosorbide mononitrate  30 mg Oral BID  . linagliptin  5 mg Oral Daily  .  losartan  12.5 mg Oral QHS  . rosuvastatin  40 mg Oral Daily  . sertraline  50 mg Oral QHS  . [START ON 12/21/2015] warfarin  10 mg Oral Once per day on Sun Wed Thu  . [START ON 12/19/2015] warfarin  7.5 mg Oral Once per day on Mon Tue Fri Sat  . Warfarin - Physician Dosing Inpatient   Does not apply q1800    Assessment: Pharmacy consulted to dose heparin in this 65 year old female S/P arteriogram.  Pt received heparin 5000 X 1 @ 12:34 and additional heparin 1000 units X 1 @ 13:56.  Will not bolus this pt, per Dr Gilda CreaseSchnier.  CrCl = 81.2 ml/min INR (5/10) = 1.45  Pt also on warfarin7.5mg  M,T,F,S and 10 mg Sun,Wed, Thu   Goal of Therapy:  Goal HL :  0.3-0.7  Monitor platelets by anticoagulation protocol: Yes   Plan:  Heparin level is 0.13 and drip has not been paused per RN. Will bolus heparin 2400 units iv once and increase infusion to 1000 units/hr. Next HL in 6 hours.   Luisa HartScott Fabrizio Filip, PharmD Clinical Pharmacist

## 2015-12-18 NOTE — Telephone Encounter (Signed)
Pt called, [facility], states she was admitted due to her coumadin was too low. The hospital states they do not have pt current dosing information . Please call.

## 2015-12-18 NOTE — Evaluation (Signed)
Physical Therapy Evaluation Patient Details Name: [name] MRN: [id number] DOB: 03/11/1951 Today's Date: 12/18/2015   History of Present Illness  Pt underwent vascular surgery for limb salvage LLE. She was presenting with ischemic foot pain at rest and is reporting post-operative aching at time of PT evaluation. Pt is otherwise healthy and active, works and drives.  Clinical Impression  Pt demonstrates antalgic gait due to post-operative LLE pain. Refuses pain medication at this time but therapy encouraged patient to continue reporting pain to RN and MD. She is independent with bed mobility and transfers. Gait speed is decreased and pt initially reaching for walls and counters. Confidence and stability improve as well as speed as distance increases. Pt encouraged to continue ambulation. No PT needs following discharge. Pt will benefit from skilled PT services to address deficits in strength, balance, and mobility in order to return to full function at home.     Follow Up Recommendations No PT follow up    Equipment Recommendations  None recommended by PT    Recommendations for Other Services       Precautions / Restrictions Precautions Precautions: None Restrictions Weight Bearing Restrictions: No      Mobility  Bed Mobility Overal bed mobility: Independent             General bed mobility comments: Good speed and sequencing noted  Transfers Overall transfer level: Independent Equipment used: None Transfers: Sit to/from Stand Sit to Stand: Independent         General transfer comment: Pt demonstrates good speed, sequencing, and stability with sit to stand transfers. She is stable in wide stance. LE strength appears good and able to perform transfer without UE support  Ambulation/Gait Ambulation/Gait assistance: Min guard Ambulation Distance (Feet): 250 Feet Assistive device: None Gait Pattern/deviations: Antalgic;Decreased step length - right;Decreased  stance time - left Gait velocity: Decreased but functional for full household mobility   General Gait Details: Pt with mildly antalgic gait with pain in L stance. She initially reaches for counters and walls but is able to progress away from walls as distance increases. Pt denies DOE and vitals remain WNL throughout distance. Gait speed slowly increases. Pt continues to report LLE pain with gait  Stairs Stairs: Yes Stairs assistance: Min guard Stair Management: One rail Left Number of Stairs: 4 General stair comments: Pt able to ascend/descend 4 stairs with unilateral railing. Instruction provided for proper sequencing (up with good/down with bad). She demonstrates good safety and stability with stairs  Wheelchair Mobility    Modified Rankin (Stroke Patients Only)       Balance Overall balance assessment: Needs assistance Sitting-balance support: No upper extremity supported Sitting balance-Leahy Scale: Good     Standing balance support: No upper extremity supported Standing balance-Leahy Scale: Fair Standing balance comment: Able to achieve and maintain feet together stance. Positive Rhomberg so pt encouraged to be careful with low light conditions.                             Pertinent Vitals/Pain Pain Assessment: 0-10 Pain Score: 4  Pain Location: L leg below the knee Pain Descriptors / Indicators: Sore Pain Intervention(s): Monitored during session    Home Living Family/patient expects to be discharged to:: Private residence Living Arrangements: Spouse/significant other Available Help at Discharge: Family Type of Home: Other(Comment) (Condo) Home Access: Stairs to enter Entrance Stairs-Rails: None (Can grab column) Entrance Stairs-Number of Steps: 3 Home Layout: One level  Home Equipment: Grab bars - tub/shower (No assistive devices)      Prior Function Level of Independence: Independent         Comments: Works and independent with ADLs/IADLs.  Drives     Hand Dominance   Dominant Hand: Right    Extremity/Trunk Assessment   Upper Extremity Assessment: Overall WFL for tasks assessed           Lower Extremity Assessment: Overall WFL for tasks assessed (Reports intact sensation bilateral LE to light touch)         Communication   Communication: No difficulties  Cognition Arousal/Alertness: Awake/alert Behavior During Therapy: WFL for tasks assessed/performed Overall Cognitive Status: Within Functional Limits for tasks assessed                      General Comments      Exercises        Assessment/Plan    PT Assessment Patient needs continued PT services  PT Diagnosis Abnormality of gait   PT Problem List Decreased balance;Pain  PT Treatment Interventions DME instruction;Gait training;Stair training;Therapeutic activities;Therapeutic exercise;Balance training;Neuromuscular re-education   PT Goals (Current goals can be found in the Care Plan section) Acute Rehab PT Goals Patient Stated Goal: Return to prior level of function at home as soon as possible PT Goal Formulation: With patient Time For Goal Achievement: 01/01/16 Potential to Achieve Goals: Good    Frequency Min 2X/week   Barriers to discharge        Co-evaluation               End of Session Equipment Utilized During Treatment: Gait belt Activity Tolerance: Patient tolerated treatment well Patient left: in bed;with call bell/phone within reach;Other (comment) (Sitting upright at EOB)           Time: [id number] PT Time Calculation (min) (ACUTE ONLY): 31 min   Charges:   PT Evaluation $PT Eval Low Complexity: 1 Procedure PT Treatments $Gait Training: 8-22 mins   PT G Codes:       Sharalyn InkJason D Anela Bensman PT, DPT   Baylee Mccorkel 12/18/2015, 2:47 PM

## 2015-12-18 NOTE — Telephone Encounter (Signed)
See above note, pt has already been contacted.

## 2015-12-18 NOTE — Progress Notes (Signed)
ANTICOAGULATION CONSULT NOTE - Initial Consult  Pharmacy Consult for Heparin  Indication: S/P arteriogram  Allergies  Allergen Reactions  . Metformin And Related Diarrhea  . Biaxin [Clarithromycin] Itching  . Atorvastatin Cough  . Codeine Nausea Only and Other (See Comments)    Constipates; doesn't like the way it makes her feel   . Oxycodone Hcl Other (See Comments)    01/10/15 patient  tolerated 8 doses of oxycodone with no allergy symptoms 7/12 noted allergy including, Palpitations, difficulty breathing.    Patient Measurements: Height: 5\' 9"  (175.3 cm) Weight: 180 lb (81.647 kg) IBW/kg (Calculated) : 66.2 Heparin Dosing Weight: 81.6 kg   Vital Signs: Temp: 98 F (36.7 C) (05/11 0541) Temp Source: Oral (05/11 0541) BP: 134/51 mmHg (05/11 1415) Pulse Rate: 61 (05/11 1415)  Labs:  Recent Labs  12/16/15 1336 12/17/15 1759 12/17/15 2225 12/18/15 0714  LABPROT  --  17.7*  --  16.2*  INR  --  1.45  --  1.29  HEPARINUNFRC  --   --  0.43 0.31  CREATININE 0.66  --   --   --     Estimated Creatinine Clearance: 81.2 mL/min (by C-G formula based on Cr of 0.66).   Medical History: Past Medical History  Diagnosis Date  . Type 2 diabetes mellitus (HCC)   . Hyperlipidemia   . Hypertension   . Coronary artery disease     Status post coronary stenting approximately 10 years ago  . Peripheral artery disease (HCC)   . Acute on chronic diastolic CHF (congestive heart failure) (HCC) 01/24/2015  . Depression   . Anxiety   . Anemia     Medications:  Scheduled:  . aspirin EC  81 mg Oral Daily  . aspirin  325 mg Oral Once  . atenolol  50 mg Oral Daily  . docusate sodium  100 mg Oral Daily  . guaiFENesin  600 mg Oral Daily  . insulin aspart  0-5 Units Subcutaneous QHS  . insulin aspart  0-9 Units Subcutaneous TID WC  . isosorbide mononitrate  30 mg Oral BID  . linagliptin  5 mg Oral Daily  . losartan  12.5 mg Oral QHS  . rosuvastatin  40 mg Oral Daily  . sertraline   50 mg Oral QHS  . [START ON 12/21/2015] warfarin  10 mg Oral Once per day on Sun Wed Thu  . [START ON 12/19/2015] warfarin  7.5 mg Oral Once per day on Mon Tue Fri Sat  . Warfarin - Physician Dosing Inpatient   Does not apply q1800    Assessment: Pharmacy consulted to dose heparin in this 65 year old female S/P arteriogram.  Pt received heparin 5000 X 1 @ 12:34 and additional heparin 1000 units X 1 @ 13:56.  Will not bolus this pt, per Dr Gilda CreaseSchnier.  CrCl = 81.2 ml/min INR (5/10) = 1.45  Pt also on warfarin7.5mg  M,T,F,S and 10 mg Sun,Wed, Thu   Goal of Therapy:  Goal HL :  0.3-0.7  Monitor platelets by anticoagulation protocol: Yes   Plan:  Heparin level is 0.31. Will continue drip at 700 units/hr and recheck in 6 hours.  Olene FlossMelissa D Lolly Glaus, Pharm.D Clinical Pharmacist

## 2015-12-19 LAB — MAGNESIUM: Magnesium: 1.7 mg/dL (ref 1.7–2.4)

## 2015-12-19 LAB — GLUCOSE, CAPILLARY
GLUCOSE-CAPILLARY: 187 mg/dL — AB (ref 65–99)
Glucose-Capillary: 197 mg/dL — ABNORMAL HIGH (ref 65–99)
Glucose-Capillary: 183 mg/dL — ABNORMAL HIGH (ref 65–99)
Glucose-Capillary: 153 mg/dL — ABNORMAL HIGH (ref 65–99)

## 2015-12-19 LAB — BASIC METABOLIC PANEL
ANION GAP: 7 (ref 5–15)
BUN: 20 mg/dL (ref 6–20)
CALCIUM: 8.8 mg/dL — AB (ref 8.9–10.3)
CO2: 26 mmol/L (ref 22–32)
CREATININE: 0.64 mg/dL (ref 0.44–1.00)
Chloride: 104 mmol/L (ref 101–111)
GFR calc Af Amer: >60 mL/min (ref >60)
GLUCOSE: 193 mg/dL — AB (ref 65–99)
Potassium: 3.9 mmol/L (ref 3.5–5.1)
Sodium: 137 mmol/L (ref 135–145)

## 2015-12-19 LAB — PROTIME-INR
INR: 1.57
Prothrombin Time: 18.8 seconds — ABNORMAL HIGH (ref 11.4–15.0)

## 2015-12-19 LAB — CBC
HCT: 34.1 % — ABNORMAL LOW (ref 35.0–47.0)
Hemoglobin: 11.5 g/dL — ABNORMAL LOW (ref 12.0–16.0)
MCH: 28.5 pg (ref 26.0–34.0)
MCHC: 33.7 g/dL (ref 32.0–36.0)
MCV: 84.7 fL (ref 80.0–100.0)
PLATELETS: 179 10*3/uL (ref 150–440)
RBC: 4.03 MIL/uL (ref 3.80–5.20)
RDW: 13.6 % (ref 11.5–14.5)
WBC: 5.7 10*3/uL (ref 3.6–11.0)

## 2015-12-19 LAB — HEPARIN LEVEL (UNFRACTIONATED): Heparin Unfractionated: 0.36 IU/mL (ref 0.30–0.70)

## 2015-12-19 MED ORDER — INSULIN ASPART 100 UNIT/ML ~~LOC~~ SOLN: 0.0000 [IU] | Freq: Three times a day (TID) | SUBCUTANEOUS | Status: DC

## 2015-12-19 NOTE — Progress Notes (Signed)
[facility]  Daily Progress Note   Subjective: 2 Days Post-Op: Introduction catheter into left lower extremity 3rd order catheter placement, Contrast injection left lower extremity for distal runoff, Crosser atherectomy of the posterior tibial artery, Percutaneous transluminal angioplasty left posterior tibial artery and Minx closure left common femoral arteriotomy.  Patient without complaint this afternoon. Seen by PT - no continued therapy needed. Ambulating well. INR this AM: 1.57  Objective: Filed Vitals:   12/18/15 1415 12/18/15 2101 12/19/15 0513 12/19/15 1252  BP: 134/51 134/56 144/66 165/55  Pulse: 61 60 63 58  Temp:  98.1 F (36.7 C) 98.2 F (36.8 C) 97.8 F (36.6 C)  TempSrc:  Oral Oral   Resp: 16 18 18    Height:      Weight:      SpO2: 99% 98% 98% 99%    Intake/Output Summary (Last 24 hours) at 12/19/15 1258 Last data filed at 12/19/15 0829  Gross per 24 hour  Intake    660 ml  Output      0 ml  Net    660 ml   Physical Exam: A&Ox3, NAD CV: RRR Pulmonary: CTA Bilaterally Abdomen: Soft, Nontender, Nondistended Vascular: Left Lower Extremity: warm, nontender, palp PT pulses  Laboratory: CBC    Component Value Date/Time   WBC 5.7 12/19/2015 0434   HGB 11.5* 12/19/2015 0434   HCT 34.1* 12/19/2015 0434   PLT 179 12/19/2015 0434   BMET    Component Value Date/Time   NA 137 12/19/2015 0434   NA 136 09/26/2013 0928   K 3.9 12/19/2015 0434   K 4.5 09/26/2013 0928   CL 104 12/19/2015 0434   CL 104 09/26/2013 0928   CO2 26 12/19/2015 0434   CO2 31 09/26/2013 0928   GLUCOSE 193* 12/19/2015 0434   GLUCOSE 145* 09/26/2013 0928   BUN 20 12/19/2015 0434   BUN 19* 09/26/2013 0928   CREATININE 0.64 12/19/2015 0434   CREATININE 0.85 09/26/2013 0928   CALCIUM 8.8* 12/19/2015 0434   CALCIUM 9.8 09/26/2013 0928   GFRNONAA >60 12/19/2015 0434   GFRNONAA >60 09/26/2013 0928   GFRAA >60 12/19/2015 0434   GFRAA >60 09/26/2013  0928   Assessment/Planning: 65 year old female s/p left lower extremity angiogram - stable 1) Patient cant not be discharged until INR is >1.8 2) Currently on heparin gtt, awaiting coumadin bridge to be therapeutic. 3) If patient discharged before therapeutic on coumadin high risk of re-thrombosis of extremity.  4) Should see appropriate bump in INR by tomorrow - anticipate discharge home tomorrow 5) OOB / ambulation 6) Dicussed with Dr. Charlie PitterSchnier  Kimberly Stegmayer PA-C 12/19/2015 12:58 PM

## 2015-12-19 NOTE — Progress Notes (Signed)
ANTICOAGULATION CONSULT NOTE - Initial Consult  Pharmacy Consult for Heparin  Indication: S/P arteriogram  Allergies  Allergen Reactions  . Metformin And Related Diarrhea  . Biaxin [Clarithromycin] Itching  . Atorvastatin Cough  . Codeine Nausea Only and Other (See Comments)    Constipates; doesn't like the way it makes her feel   . Oxycodone Hcl Other (See Comments)    01/10/15 patient  tolerated 8 doses of oxycodone with no allergy symptoms 7/12 noted allergy including, Palpitations, difficulty breathing.    Patient Measurements: Height:  (175.3 cm) Weight: 180 lb (81.647 kg) IBW/kg (Calculated) : 66.2 Heparin Dosing Weight: 81.6 kg   Vital Signs: Temp: 98.2 F (36.8 C) (05/12 0513) Temp Source: Oral (05/12 0513) BP: 144/66 mmHg (05/12 0513) Pulse Rate: 63 (05/12 0513)  Labs:  Recent Labs  12/16/15 1336 12/17/15 1759  12/18/15 0714 12/18/15 1459 12/18/15 2249 12/19/15 0434  HGB  --   --   --   --   --   --  11.5*  HCT  --   --   --   --   --   --  34.1*  PLT  --   --   --   --   --   --  179  LABPROT  --  17.7*  --  16.2*  --   --  18.8*  INR  --  1.45  --  1.29  --   --  1.57  HEPARINUNFRC  --   --   < > 0.31 0.13* 0.42 0.36  CREATININE 0.66  --   --   --   --   --  0.64  < > = values in this interval not displayed.  Estimated Creatinine Clearance: 81.2 mL/min (by C-G formula based on Cr of 0.64).   Medical History: Past Medical History  Diagnosis Date  . Type 2 diabetes mellitus (HCC)   . Hyperlipidemia   . Hypertension   . Coronary artery disease     Status post coronary stenting approximately 10 years ago  . Peripheral artery disease (HCC)   . Acute on chronic diastolic CHF (congestive heart failure) (HCC) 01/24/2015  . Depression   . Anxiety   . Anemia     Medications:  Scheduled:  . aspirin EC  81 mg Oral Daily  . aspirin  325 mg Oral Once  . atenolol  50 mg Oral Daily  . docusate sodium  100 mg Oral Daily  . guaiFENesin  600 mg Oral  Daily  . insulin aspart  0-5 Units Subcutaneous QHS  . insulin aspart  0-9 Units Subcutaneous TID WC  . isosorbide mononitrate  30 mg Oral BID  . linagliptin  5 mg Oral Daily  . losartan  12.5 mg Oral QHS  . rosuvastatin  40 mg Oral Daily  . sertraline  50 mg Oral QHS  . [START ON 12/21/2015] warfarin  10 mg Oral Once per day on Sun Wed Thu  . warfarin  7.5 mg Oral Once per day on Mon Tue Fri Sat  . Warfarin - Physician Dosing Inpatient   Does not apply q1800    Assessment: Pharmacy consulted to dose heparin in this 65 year old female S/P arteriogram.  Pt received heparin 5000 X 1 @ 12:34 and additional heparin 1000 units X 1 @ 13:56.  Will not bolus this pt, per Dr [name].  CrCl = 81.2 ml/min INR (5/10) = 1.45  Pt also on warfarin7.5mg  M,T,F,S and 10 mg  Sun,Wed, Thu   Goal of Therapy:  Goal HL :  0.3-0.7  Monitor platelets by anticoagulation protocol: Yes   Plan:  Heparin level is 0.36. Will continue with current rate of 1000 units/hr. Next HL with 5/13 AM labs.   Clovia CuffLisa Bryson Gavia, PharmD, BCPS 12/19/2015 5:42 AM

## 2015-12-19 NOTE — Care Management Important Message (Signed)
Important Message  Patient Details  Name: [name] MRN: [id number] Date of Birth: [birth year]   Medicare Important Message Given:  Yes    Olegario MessierKathy A Steffen Hase 12/19/2015, 1:54 PM

## 2015-12-19 NOTE — Progress Notes (Signed)
Inpatient Diabetes Program Recommendations  AACE/ADA: New Consensus Statement on Inpatient Glycemic Control (2015)  Target Ranges:  Prepandial:   less than 140 mg/dL      Peak postprandial:   less than 180 mg/dL (1-2 hours)      Critically ill patients:  140 - 180 mg/dL   Results for Dominique MaffucciOTEAT, Raylinn K (MRN [id number]) as of 12/19/2015 09:34  Ref. Range 12/18/2015 07:41 12/18/2015 12:09 12/18/2015 16:16 12/18/2015 21:04 12/19/2015 07:45  Glucose-Capillary Latest Ref Range: 65-99 mg/dL 130156 (H) 865237 (H) 784228 (H) 223 (H) 197 (H)   Review of Glycemic Control  Diabetes history: DM 2 Outpatient Diabetes medications: Januvia 100 mg Daily Current orders for Inpatient glycemic control: Novolog Sensitive (0-9 units) + HS scale (Novolog 0-5 units) + Tradjenta 5 mg Daily  Inpatient Diabetes Program Recommendations: Correction (SSI): Glucose is running mostly in the 200 range. Please consider increasing correction scale to Novolog Moderate (0-15 units) TID.  Thanks,  Christena DeemShannon Delman Goshorn RN, MSN, [facility] Inpatient Diabetes Coordinator Team Pager [phone] (8a-5p)

## 2015-12-19 NOTE — Progress Notes (Signed)
ANTICOAGULATION CONSULT NOTE - Initial Consult  Pharmacy Consult for Heparin  Indication: S/P arteriogram  Allergies  Allergen Reactions  . Metformin And Related Diarrhea  . Biaxin [Clarithromycin] Itching  . Atorvastatin Cough  . Codeine Nausea Only and Other (See Comments)    Constipates; doesn't like the way it makes her feel   . Oxycodone Hcl Other (See Comments)    01/10/15 patient  tolerated 8 doses of oxycodone with no allergy symptoms 7/12 noted allergy including, Palpitations, difficulty breathing.    Patient Measurements: Height: 5\' 9"  (175.3 cm) Weight: 180 lb (81.647 kg) IBW/kg (Calculated) : 66.2 Heparin Dosing Weight: 81.6 kg   Vital Signs: Temp: 98.1 F (36.7 C) (05/11 2101) Temp Source: Oral (05/11 2101) BP: 134/56 mmHg (05/11 2101) Pulse Rate: 60 (05/11 2101)  Labs:  Recent Labs  12/16/15 1336 12/17/15 1759  12/18/15 0714 12/18/15 1459 12/18/15 2249  LABPROT  --  17.7*  --  16.2*  --   --   INR  --  1.45  --  1.29  --   --   HEPARINUNFRC  --   --   < > 0.31 0.13* 0.42  CREATININE 0.66  --   --   --   --   --   < > = values in this interval not displayed.  Estimated Creatinine Clearance: 81.2 mL/min (by C-G formula based on Cr of 0.66).   Medical History: Past Medical History  Diagnosis Date  . Type 2 diabetes mellitus (HCC)   . Hyperlipidemia   . Hypertension   . Coronary artery disease     Status post coronary stenting approximately 10 years ago  . Peripheral artery disease (HCC)   . Acute on chronic diastolic CHF (congestive heart failure) (HCC) 01/24/2015  . Depression   . Anxiety   . Anemia     Medications:  Scheduled:  . aspirin EC  81 mg Oral Daily  . aspirin  325 mg Oral Once  . atenolol  50 mg Oral Daily  . docusate sodium  100 mg Oral Daily  . guaiFENesin  600 mg Oral Daily  . insulin aspart  0-5 Units Subcutaneous QHS  . insulin aspart  0-9 Units Subcutaneous TID WC  . isosorbide mononitrate  30 mg Oral BID  .  linagliptin  5 mg Oral Daily  . losartan  12.5 mg Oral QHS  . rosuvastatin  40 mg Oral Daily  . sertraline  50 mg Oral QHS  . [START ON 12/21/2015] warfarin  10 mg Oral Once per day on Sun Wed Thu  . warfarin  7.5 mg Oral Once per day on Mon Tue Fri Sat  . Warfarin - Physician Dosing Inpatient   Does not apply q1800    Assessment: Pharmacy consulted to dose heparin in this 65 year old female S/P arteriogram.  Pt received heparin 5000 X 1 @ 12:34 and additional heparin 1000 units X 1 @ 13:56.  Will not bolus this pt, per Dr Gilda CreaseSchnier.  CrCl = 81.2 ml/min INR (5/10) = 1.45  Pt also on warfarin7.5mg  M,T,F,S and 10 mg Sun,Wed, Thu   Goal of Therapy:  Goal HL :  0.3-0.7  Monitor platelets by anticoagulation protocol: Yes   Plan:  Heparin level is 0.42 Will continue with current rate of 1000 units/hr. Next HL in 6 hours.   Clovia CuffLisa Rafal Archuleta, PharmD, BCPS 12/19/2015 12:15 AM

## 2015-12-19 NOTE — Discharge Summary (Signed)
[facility]    Discharge Summary    Patient ID:  [name] MRN: [id number] DOB/AGE: [birth year] 64 y.o.  Admit date: 12/17/2015 Discharge date: 12/20/2015 Date of Surgery: 12/17/2015 Surgeon: Surgeon(s): Renford Dills, MD  Admission Diagnosis: Lt leg ASO with Ulcer  Discharge Diagnoses:  Lt leg ASO with Ulcer  Secondary Diagnoses: Past Medical History  Diagnosis Date  . Type 2 diabetes mellitus (HCC)   . Hyperlipidemia   . Hypertension   . Coronary artery disease     Status post coronary stenting approximately 10 years ago  . Peripheral artery disease (HCC)   . Acute on chronic diastolic CHF (congestive heart failure) (HCC) 01/24/2015  . Depression   . Anxiety   . Anemia    Procedure(s): Lower Extremity Angiography Lower Extremity Intervention  Discharged Condition: good  HPI:  [name] is a 65 year old female who presented with severe pain which she rated 10 out of 10 as well as a nonhealing wound of the left foot. She has known atherosclerotic occlusive disease which does not appear to have a surgical bypass option. She is therefore returning to special procedures for a limb salvage attempt.   On 12/17/15, the patient underwent a Introduction catheter into left lower extremity 3rd order catheter placement, Contrast injection left lower extremity for distal runoff, Crosser atherectomy of the posterior tibial artery, Percutaneous transluminal angioplasty left posterior tibial artery with Minx closure left common femoral arteriotomy. She tolerated the procedure well and was transferred from the PACU to the surgical floor without complication. She was started on a heparin drip while she awaiting her coumadin bridge to come therapeutic. Upon discharge her coumadin was 1.8. She was given one shot of lovenox before discharge and will be following up with her PCP for INR check on Wed. The patient was seen by PT and deemed appropriate for  discharge home without continued therapy.   Hospital Course:  [name] is a 65 y.o. female is S/P Left Procedure(s): Lower Extremity Angiography Lower Extremity Intervention  Extubated: POD # 0  Physical exam:  A&Ox3, NAD CV: RRR Pulmonary: CTA Bilaterally Abdomen: Soft, Nontender, Nondistended Vascular: Left Lower Extremity: warm, nontender, palp PT pulses  Post-op wounds clean, dry, intact or healing well  Pt. Ambulating, voiding and taking PO diet without difficulty.  Pt pain controlled with PO pain meds.  Labs as below  Complications:none  Consults:   None  Significant Diagnostic Studies: CBC Lab Results  Component Value Date   WBC 5.8 12/20/2015   HGB 11.3* 12/20/2015   HCT 33.5* 12/20/2015   MCV 84.7 12/20/2015   PLT 177 12/20/2015   BMET    Component Value Date/Time   NA 138 12/20/2015 0453   NA 136 09/26/2013 0928   K 3.9 12/20/2015 0453   K 4.5 09/26/2013 0928   CL 107 12/20/2015 0453   CL 104 09/26/2013 0928   CO2 23 12/20/2015 0453   CO2 31 09/26/2013 0928   GLUCOSE 191* 12/20/2015 0453   GLUCOSE 145* 09/26/2013 0928   BUN 17 12/20/2015 0453   BUN 19* 09/26/2013 0928   CREATININE 0.53 12/20/2015 0453   CREATININE 0.85 09/26/2013 0928   CALCIUM 8.8* 12/20/2015 0453   CALCIUM 9.8 09/26/2013 0928   GFRNONAA >60 12/20/2015 0453   GFRNONAA >60 09/26/2013 0928   GFRAA >60 12/20/2015 0453   GFRAA >60 09/26/2013 0928   COAG Lab Results  Component Value Date   INR 1.81 12/20/2015  INR 1.57 12/19/2015   INR 1.29 12/18/2015   Disposition:  Discharge to :Home    Medication List    STOP taking these medications        potassium chloride 10 MEQ tablet  Commonly known as:  K-DUR,KLOR-CON     warfarin 5 MG tablet  Commonly known as:  COUMADIN      TAKE these medications        AMBIEN 10 MG tablet  Generic drug:  zolpidem  Take 10 mg by mouth at bedtime as needed.     aspirin 81 MG EC tablet  Take 1 tablet  (81 mg total) by mouth daily.     atenolol 100 MG tablet  Commonly known as:  TENORMIN  Take 0.5 tablets (50 mg total) by mouth daily.     furosemide 20 MG tablet  Commonly known as:  LASIX  Take 1 tablet (20 mg total) by mouth daily as needed.     guaiFENesin 600 MG 12 hr tablet  Commonly known as:  MUCINEX  Take 1 tablet (600 mg total) by mouth daily. Takes 1 tab daily, can take addt'l tab as needed for mucus     isosorbide mononitrate 30 MG 24 hr tablet  Commonly known as:  IMDUR  Take 1 tablet (30 mg total) by mouth 2 (two) times daily.     losartan 25 MG tablet  Commonly known as:  COZAAR  Take 0.5 tablets (12.5 mg total) by mouth at bedtime.     norethindrone-ethinyl estradiol-iron 1.5-30 MG-MCG tablet  Commonly known as:  MICROGESTIN FE,GILDESS FE,LOESTRIN FE  Take 1 tablet by mouth daily.     potassium chloride 10 MEQ tablet  Commonly known as:  K-DUR  Take 1 tablet (10 mEq total) by mouth 2 (two) times daily as needed.     rosuvastatin 40 MG tablet  Commonly known as:  CRESTOR  Take 1 tablet (40 mg total) by mouth daily.     sertraline 50 MG tablet  Commonly known as:  ZOLOFT  Take 1 tablet (50 mg total) by mouth at bedtime.     sitaGLIPtin 100 MG tablet  Commonly known as:  JANUVIA  Take 100 mg by mouth daily.     traMADol 50 MG tablet  Commonly known as:  ULTRAM  Take 1 tablet (50 mg total) by mouth every 6 (six) hours as needed for moderate pain.       Patient to take her coumadin as per her normal routine.  INR check on Wed.   Verbal and written Discharge instructions given to the patient. Wound care per Discharge AVS Follow-up Information    Follow up with Schnier, Latina CraverGregory G, MD In 1 month.   Specialties:  Vascular Surgery, Cardiology, Radiology, Vascular Surgery   Why:  Post-Procedure Follow Up. ABI and left lower extremity duplex   Contact information:   [address] [place] [address] [phone]      Signed: Tonette LedererKIMBERLY A Caroll Cunnington,  PA-C  12/20/2015, 12:57 PM

## 2015-12-19 NOTE — Progress Notes (Signed)
Physical Therapy Treatment Patient Details Name: [name] K Bleiler MRN: [id number] DOB: 08/29/1950 Today's Date: 12/19/2015    History of Present Illness Pt underwent vascular surgery for limb salvage LLE. She was presenting with ischemic foot pain at trest and is reporting post-operative aching at time of PT evaluation. Pt is otherwise healthy and active, works and drives.    PT Comments    Pt did very well with PT today and states that her L LE is feeling much better with no pain and only c/o ankle stiffness/calf tightness. She is able to do some prolonged ambulation w/o fatigue or safety issues.  Pt is feeling confidence and ready to go home.    Follow Up Recommendations  No PT follow up     Equipment Recommendations  None recommended by PT    Recommendations for Other Services       Precautions / Restrictions Precautions Precautions: None Restrictions Weight Bearing Restrictions: No    Mobility  Bed Mobility Overal bed mobility: Independent                Transfers Overall transfer level: Independent               General transfer comment: Pt able to rise w/o hesitation, shows overall safety and confidence  Ambulation/Gait Ambulation/Gait assistance: Modified independent (Device/Increase time) Ambulation Distance (Feet): 450 Feet Assistive device: None       General Gait Details: Pt with some L foot eversion and minimally decreased mobility in the ankle but overall walks with good speed, confidence, consistency and w/o safety issues.    Stairs            Wheelchair Mobility    Modified Rankin (Stroke Patients Only)       Balance     Sitting balance-Leahy Scale: Good       Standing balance-Leahy Scale: Good                      Cognition Arousal/Alertness: Awake/alert Behavior During Therapy: WFL for tasks assessed/performed Overall Cognitive Status: Within Functional Limits for tasks assessed                       Exercises Other Exercises Other Exercises: PROM calf stretch 3 X 30 seconds with education for self stretching Other Exercises: resisted LAQ at EOB X 10 Other Exercises: static standing with NBOS with slight weight shifts Other Exercises: calf raises with UE support X 5, educated on HEP stretch/strength options    General Comments        Pertinent Vitals/Pain Pain Assessment: 0-10 Pain Score: 0-No pain Pain Location: minimal tightness in L achilles    Home Living                      Prior Function            PT Goals (current goals can now be found in the care plan section) Progress towards PT goals: Progressing toward goals    Frequency  Min 2X/week    PT Plan Current plan remains appropriate    Co-evaluation             End of Session Equipment Utilized During Treatment: Gait belt Activity Tolerance: Patient tolerated treatment well Patient left: in bed;with call bell/phone within reach;Other (comment)     Time: [id number] PT Time Calculation (min) (ACUTE ONLY): 34 min  Charges:  $Gait Training: 8-22 mins $Therapeutic Exercise: 8-22 mins  G Codes:      Malachi Pro, DPT 12/19/2015, 4:16 PM

## 2015-12-20 LAB — BASIC METABOLIC PANEL
Anion gap: 8 (ref 5–15)
BUN: 17 mg/dL (ref 6–20)
CHLORIDE: 107 mmol/L (ref 101–111)
CO2: 23 mmol/L (ref 22–32)
Calcium: 8.8 mg/dL — ABNORMAL LOW (ref 8.9–10.3)
Creatinine, Ser: 0.53 mg/dL (ref 0.44–1.00)
GFR calc non Af Amer: >60 mL/min (ref >60)
Glucose, Bld: 191 mg/dL — ABNORMAL HIGH (ref 65–99)
POTASSIUM: 3.9 mmol/L (ref 3.5–5.1)
SODIUM: 138 mmol/L (ref 135–145)

## 2015-12-20 LAB — MAGNESIUM: MAGNESIUM: 1.5 mg/dL — AB (ref 1.7–2.4)

## 2015-12-20 LAB — CBC
HEMATOCRIT: 33.5 % — AB (ref 35.0–47.0)
Hemoglobin: 11.3 g/dL — ABNORMAL LOW (ref 12.0–16.0)
MCH: 28.5 pg (ref 26.0–34.0)
MCHC: 33.7 g/dL (ref 32.0–36.0)
MCV: 84.7 fL (ref 80.0–100.0)
Platelets: 177 10*3/uL (ref 150–440)
RBC: 3.95 MIL/uL (ref 3.80–5.20)
RDW: 13.4 % (ref 11.5–14.5)
WBC: 5.8 10*3/uL (ref 3.6–11.0)

## 2015-12-20 LAB — PROTIME-INR
INR: 1.81
PROTHROMBIN TIME: 20.9 s — AB (ref 11.4–15.0)

## 2015-12-20 LAB — GLUCOSE, CAPILLARY
GLUCOSE-CAPILLARY: 176 mg/dL — AB (ref 65–99)
Glucose-Capillary: 165 mg/dL — ABNORMAL HIGH (ref 65–99)

## 2015-12-20 LAB — HEPARIN LEVEL (UNFRACTIONATED): Heparin Unfractionated: 0.28 IU/mL — ABNORMAL LOW (ref 0.30–0.70)

## 2015-12-20 MED ORDER — ROSUVASTATIN CALCIUM 40 MG PO TABS
40.0000 mg | ORAL_TABLET | Freq: Every day | ORAL | Status: DC
Start: 2015-12-20 — End: 2016-11-09

## 2015-12-20 MED ORDER — GUAIFENESIN ER 600 MG PO TB12: 600.0000 mg | ORAL_TABLET | Freq: Every day | ORAL | Status: DC

## 2015-12-20 MED ORDER — FUROSEMIDE 20 MG PO TABS: 20.0000 mg | ORAL_TABLET | Freq: Every day | ORAL | Status: DC | PRN

## 2015-12-20 MED ORDER — POTASSIUM CHLORIDE ER 10 MEQ PO TBCR: 10.0000 meq | EXTENDED_RELEASE_TABLET | Freq: Two times a day (BID) | ORAL | Status: DC | PRN

## 2015-12-20 MED ORDER — TRAMADOL HCL 50 MG PO TABS: 50.0000 mg | ORAL_TABLET | Freq: Four times a day (QID) | ORAL | Status: DC | PRN

## 2015-12-20 MED ORDER — ATENOLOL 100 MG PO TABS: 50.0000 mg | ORAL_TABLET | Freq: Every day | ORAL | Status: DC

## 2015-12-20 MED ORDER — ASPIRIN 81 MG PO TBEC: 81.0000 mg | DELAYED_RELEASE_TABLET | Freq: Every day | ORAL | Status: DC

## 2015-12-20 MED ORDER — SERTRALINE HCL 50 MG PO TABS: 50.0000 mg | ORAL_TABLET | Freq: Every day | ORAL | Status: DC

## 2015-12-20 MED ORDER — ENOXAPARIN SODIUM 60 MG/0.6ML ~~LOC~~ SOLN
60.0000 mg | Freq: Once | SUBCUTANEOUS | Status: AC
  Administered 2015-12-20: 60 mg via SUBCUTANEOUS
  Filled 2015-12-20: qty 0.6

## 2015-12-20 MED ORDER — ISOSORBIDE MONONITRATE ER 30 MG PO TB24: 30.0000 mg | ORAL_TABLET | Freq: Two times a day (BID) | ORAL | Status: DC

## 2015-12-20 MED ORDER — LOSARTAN POTASSIUM 25 MG PO TABS: 12.5000 mg | ORAL_TABLET | Freq: Every day | ORAL | Status: DC

## 2015-12-20 NOTE — Discharge Instructions (Signed)
Continue coumadin as per your normal dosing. INR check on Wed as per patient. You may shower.

## 2015-12-20 NOTE — Progress Notes (Signed)
[facility]  Daily Progress Note   Subjective: 3 Days Post-Op: Introduction catheter into left lower extremity 3rd order catheter placement, Contrast injection left lower extremity for distal runoff, Crosser atherectomy of the posterior tibial artery, Percutaneous transluminal angioplasty left posterior tibial artery and Minx closure left common femoral arteriotomy.  Patient INR this AM 1.81. Patient asking to go home. Minimal discomfort in left lower extremity. Patient has appointment to follow up for INR check on Wed.  Objective: Filed Vitals:   12/19/15 0513 12/19/15 1252 12/19/15 2111 12/20/15 0515  BP: 144/66 165/55 144/55 153/60  Pulse: 63 58 58 65  Temp: 98.2 F (36.8 C) 97.8 F (36.6 C) 98.2 F (36.8 C) 98.8 F (37.1 C)  TempSrc: Oral  Oral Oral  Resp: 18  18 18   Height:      Weight:      SpO2: 98% 99% 97% 97%    Intake/Output Summary (Last 24 hours) at 12/20/15 1148 Last data filed at 12/20/15 0740  Gross per 24 hour  Intake    709 ml  Output      0 ml  Net    709 ml   Physical Exam: A&Ox3, NAD CV: RRR Pulmonary: CTA Bilaterally Abdomen: Soft, Nontender, Nondistended Vascular: Left Lower Extremity: warm, nontender, palp PT pulses   Laboratory: CBC    Component Value Date/Time   WBC 5.8 12/20/2015 0453   HGB 11.3* 12/20/2015 0453   HCT 33.5* 12/20/2015 0453   PLT 177 12/20/2015 0453   BMET    Component Value Date/Time   NA 138 12/20/2015 0453   NA 136 09/26/2013 0928   K 3.9 12/20/2015 0453   K 4.5 09/26/2013 0928   CL 107 12/20/2015 0453   CL 104 09/26/2013 0928   CO2 23 12/20/2015 0453   CO2 31 09/26/2013 0928   GLUCOSE 191* 12/20/2015 0453   GLUCOSE 145* 09/26/2013 0928   BUN 17 12/20/2015 0453   BUN 19* 09/26/2013 0928   CREATININE 0.53 12/20/2015 0453   CREATININE 0.85 09/26/2013 0928   CALCIUM 8.8* 12/20/2015 0453   CALCIUM 9.8 09/26/2013 0928   GFRNONAA >60 12/20/2015 0453   GFRNONAA >60  09/26/2013 0928   GFRAA >60 12/20/2015 0453   GFRAA >60 09/26/2013 0928   Assessment/Planning: 65 year old female s/p left lower extremity angiogram - stable 1) INR now 1.8, will give one shot of lovenox 2) Patient to follow up on wed for INR check with her own MD - already has appointment 3) Dicussed with Dr. Wallis Martew  Ugochi Henzler PA-C 12/20/2015 11:48 AM

## 2015-12-24 ENCOUNTER — Ambulatory Visit (INDEPENDENT_AMBULATORY_CARE_PROVIDER_SITE_OTHER): Payer: Medicare Other

## 2015-12-24 DIAGNOSIS — Z5181 Encounter for therapeutic drug level monitoring: Secondary | ICD-10-CM | POA: Diagnosis not present

## 2015-12-24 DIAGNOSIS — I480 Paroxysmal atrial fibrillation: Secondary | ICD-10-CM | POA: Diagnosis not present

## 2015-12-24 DIAGNOSIS — I2699 Other pulmonary embolism without acute cor pulmonale: Secondary | ICD-10-CM | POA: Diagnosis not present

## 2015-12-24 LAB — POCT INR: INR: 2.9

## 2016-01-07 ENCOUNTER — Ambulatory Visit (INDEPENDENT_AMBULATORY_CARE_PROVIDER_SITE_OTHER): Payer: Medicare Other

## 2016-01-07 DIAGNOSIS — I2699 Other pulmonary embolism without acute cor pulmonale: Secondary | ICD-10-CM

## 2016-01-07 DIAGNOSIS — I480 Paroxysmal atrial fibrillation: Secondary | ICD-10-CM | POA: Diagnosis not present

## 2016-01-07 DIAGNOSIS — Z5181 Encounter for therapeutic drug level monitoring: Secondary | ICD-10-CM

## 2016-01-07 LAB — POCT INR: INR: 3.5

## 2016-01-21 ENCOUNTER — Telehealth: Payer: Self-pay | Admitting: Cardiovascular Disease

## 2016-01-21 ENCOUNTER — Ambulatory Visit (INDEPENDENT_AMBULATORY_CARE_PROVIDER_SITE_OTHER): Payer: Medicare Other

## 2016-01-21 DIAGNOSIS — I480 Paroxysmal atrial fibrillation: Secondary | ICD-10-CM

## 2016-01-21 DIAGNOSIS — Z5181 Encounter for therapeutic drug level monitoring: Secondary | ICD-10-CM

## 2016-01-21 DIAGNOSIS — I2699 Other pulmonary embolism without acute cor pulmonale: Secondary | ICD-10-CM | POA: Diagnosis not present

## 2016-01-21 LAB — POCT INR: INR: 2.8

## 2016-01-21 NOTE — Telephone Encounter (Signed)
Pt c/o of Chest Pain: STAT if CP now or developed within 24 hours  1. Are you having CP right now? No   2. Are you experiencing any other symptoms (ex. SOB, nausea, vomiting, sweating)?   Nausea   Slightly lightheaded  SOB X 1 episode pain in jaw left arm and chest   3. How long have you been experiencing CP? Around time she saw dr. Mariah Millinggollan last and then again when mom passed   4. Is your CP continuous or coming and going? Comes and goes   5. Have you taken Nitroglycerin? No, took asa and pain was relieved   Not regular or sustained episodes but patient is concerned  ?

## 2016-01-21 NOTE — Telephone Encounter (Signed)
Pt's concerns addressed during coumadin visit.  Please see anti-coag visit for documentation.

## 2016-02-16 ENCOUNTER — Other Ambulatory Visit: Payer: Self-pay | Admitting: Cardiovascular Disease

## 2016-02-16 MED ORDER — WARFARIN SODIUM 5 MG PO TABS: ORAL_TABLET | ORAL | Status: DC

## 2016-02-16 NOTE — Telephone Encounter (Signed)
Pharmacist has a question regarding Warfarin dosage. Please call.

## 2016-02-16 NOTE — Telephone Encounter (Signed)
Pt's script from primary MD states for pt to take 1 tablet daily of Warfarin 5mg  #50 tablets, this would be a 50 day supply however pt is taking more than 1 tablet daily in fact pt's current Warfarin dosage is 1.5 tablets daily except 2 tablets 3 days a week. Advised pharmacist I will send in new rx with instructions indicating 50 tablets is a 30 day supply.

## 2016-02-18 ENCOUNTER — Ambulatory Visit (INDEPENDENT_AMBULATORY_CARE_PROVIDER_SITE_OTHER): Payer: Medicare Other | Admitting: *Deleted

## 2016-02-18 DIAGNOSIS — I480 Paroxysmal atrial fibrillation: Secondary | ICD-10-CM | POA: Diagnosis not present

## 2016-02-18 DIAGNOSIS — I2699 Other pulmonary embolism without acute cor pulmonale: Secondary | ICD-10-CM | POA: Diagnosis not present

## 2016-02-18 DIAGNOSIS — Z5181 Encounter for therapeutic drug level monitoring: Secondary | ICD-10-CM

## 2016-02-18 LAB — POCT INR: INR: 2.4

## 2016-03-17 ENCOUNTER — Ambulatory Visit (INDEPENDENT_AMBULATORY_CARE_PROVIDER_SITE_OTHER): Payer: Medicare Other

## 2016-03-17 DIAGNOSIS — Z5181 Encounter for therapeutic drug level monitoring: Secondary | ICD-10-CM | POA: Diagnosis not present

## 2016-03-17 DIAGNOSIS — I2699 Other pulmonary embolism without acute cor pulmonale: Secondary | ICD-10-CM | POA: Diagnosis not present

## 2016-03-17 DIAGNOSIS — I480 Paroxysmal atrial fibrillation: Secondary | ICD-10-CM | POA: Diagnosis not present

## 2016-03-17 LAB — POCT INR: INR: 3.7

## 2016-04-14 ENCOUNTER — Telehealth: Payer: Self-pay | Admitting: Cardiovascular Disease

## 2016-04-14 NOTE — Telephone Encounter (Signed)
Pt calling (a bit upset/Crying) last week they found out her husband who had a  Routine chest xray and they saw a mass and did a CT scan  Needs a recommendation for a cancer doctor. She is asking Dr Mariah MillingGollan for a good doctor who can help with this.  Please call back .

## 2016-04-14 NOTE — Telephone Encounter (Signed)
Pt's husband is sched to see Dr. Thelma Bargeaks this Friday, 04/14/16.

## 2016-04-15 NOTE — Telephone Encounter (Signed)
He is in good hands with Dr. Inez Catalinaakes Dr. Inez Catalinaakes will likely refer to oncology team

## 2016-04-21 ENCOUNTER — Ambulatory Visit (INDEPENDENT_AMBULATORY_CARE_PROVIDER_SITE_OTHER): Payer: Medicare Other | Admitting: *Deleted

## 2016-04-21 ENCOUNTER — Ambulatory Visit (INDEPENDENT_AMBULATORY_CARE_PROVIDER_SITE_OTHER): Payer: Medicare Other | Admitting: Cardiovascular Disease

## 2016-04-21 ENCOUNTER — Encounter: Payer: Self-pay | Admitting: Cardiovascular Disease

## 2016-04-21 VITALS — BP 136/80 | HR 63 | Ht 69.0 in | Wt 187.0 lb

## 2016-04-21 DIAGNOSIS — I2699 Other pulmonary embolism without acute cor pulmonale: Secondary | ICD-10-CM | POA: Diagnosis not present

## 2016-04-21 DIAGNOSIS — I10 Essential (primary) hypertension: Secondary | ICD-10-CM

## 2016-04-21 DIAGNOSIS — Z5181 Encounter for therapeutic drug level monitoring: Secondary | ICD-10-CM | POA: Diagnosis not present

## 2016-04-21 DIAGNOSIS — I25810 Atherosclerosis of coronary artery bypass graft(s) without angina pectoris: Secondary | ICD-10-CM

## 2016-04-21 DIAGNOSIS — Z951 Presence of aortocoronary bypass graft: Secondary | ICD-10-CM | POA: Diagnosis not present

## 2016-04-21 DIAGNOSIS — I200 Unstable angina: Secondary | ICD-10-CM | POA: Diagnosis not present

## 2016-04-21 DIAGNOSIS — I5033 Acute on chronic diastolic (congestive) heart failure: Secondary | ICD-10-CM | POA: Diagnosis not present

## 2016-04-21 DIAGNOSIS — I480 Paroxysmal atrial fibrillation: Secondary | ICD-10-CM

## 2016-04-21 DIAGNOSIS — E1159 Type 2 diabetes mellitus with other circulatory complications: Secondary | ICD-10-CM | POA: Diagnosis not present

## 2016-04-21 DIAGNOSIS — I739 Peripheral vascular disease, unspecified: Secondary | ICD-10-CM

## 2016-04-21 DIAGNOSIS — E785 Hyperlipidemia, unspecified: Secondary | ICD-10-CM

## 2016-04-21 LAB — POCT INR: INR: 5.1

## 2016-04-21 MED ORDER — NITROGLYCERIN 0.4 MG SL SUBL: 0.4000 mg | SUBLINGUAL_TABLET | SUBLINGUAL | 3 refills | Status: DC | PRN

## 2016-04-21 NOTE — Patient Instructions (Addendum)
Medication Instructions:   No medication changes made  Labwork:  No new labs needed  Testing/Procedures:  No further testing at this time  Please call if shortness of breath gets worse Stress test be ordered   Follow-Up: It was a pleasure seeing you in the office today. Please call us if you have new issues that need to be addressed before your next appt.  [phone]  Your physician wants you to follow-up in: 6 months.  You will receive a reminder letter in the mail two months in advance. If you don't receive a letter, please call our office to schedule the follow-up appointment.  If you need a refill on your cardiac medications before your next appointment, please call your pharmacy.
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
## 2016-04-25 IMAGING — US US EXTREM LOW VENOUS BILAT
1 series · 13 of 24 positions shown · non-contrast
Comparison: None.

CLINICAL DATA: 64-year-old female status post cardiac surgery, with
concern for DVT



[Series 1: us extrem low venous bilat · 0.08mm/px · 13 of 68 slices shown]
[im 1/68]
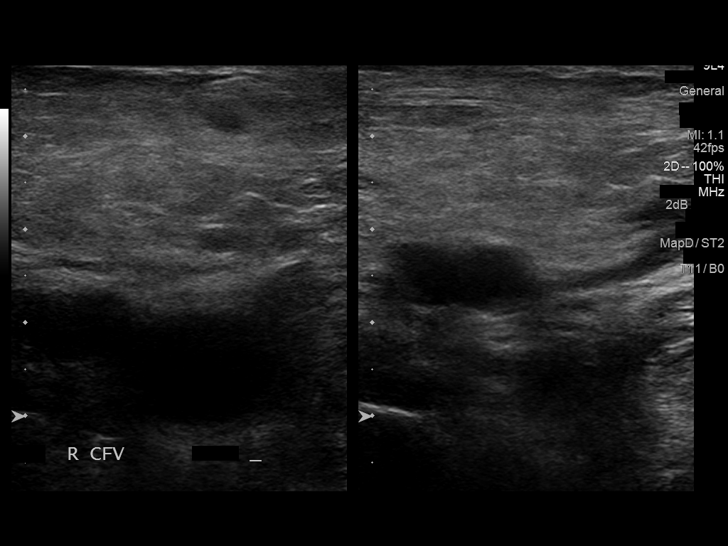
[im 6/68]
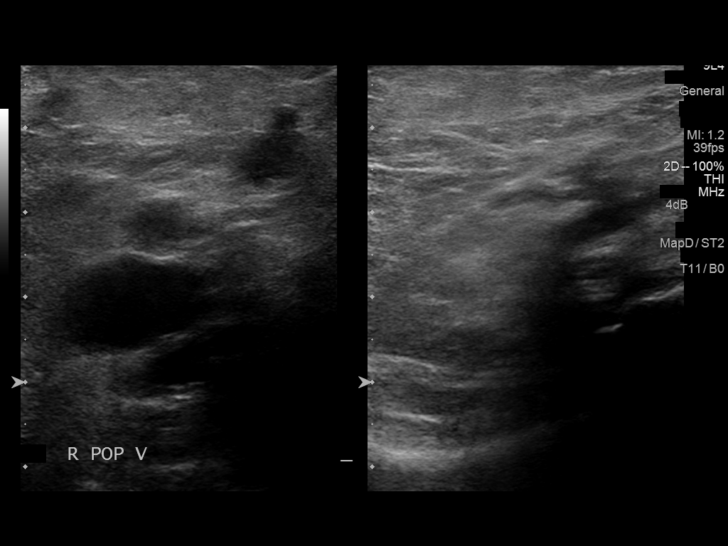
[im 12/68]
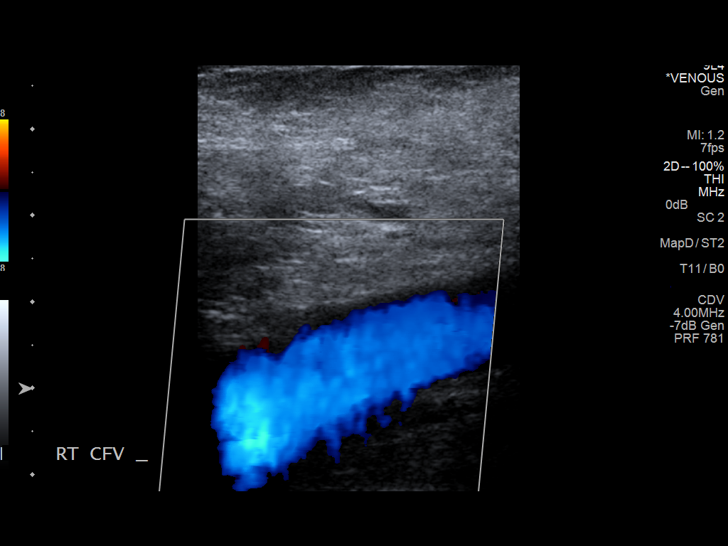
[im 18/68]
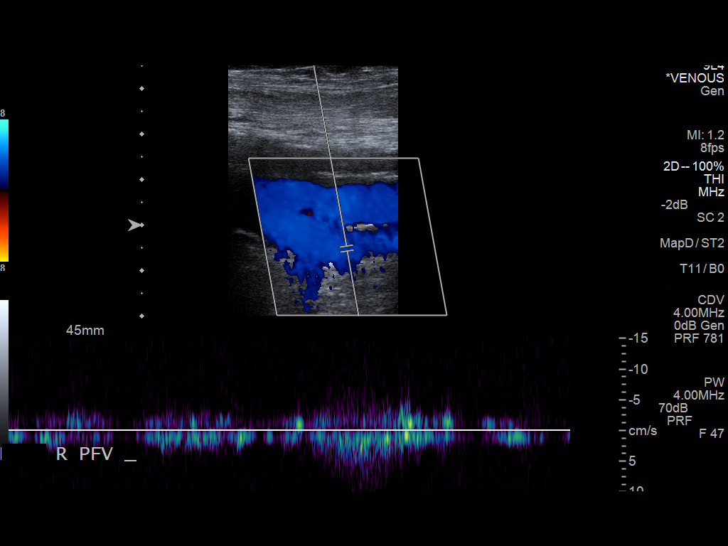
[im 24/68]
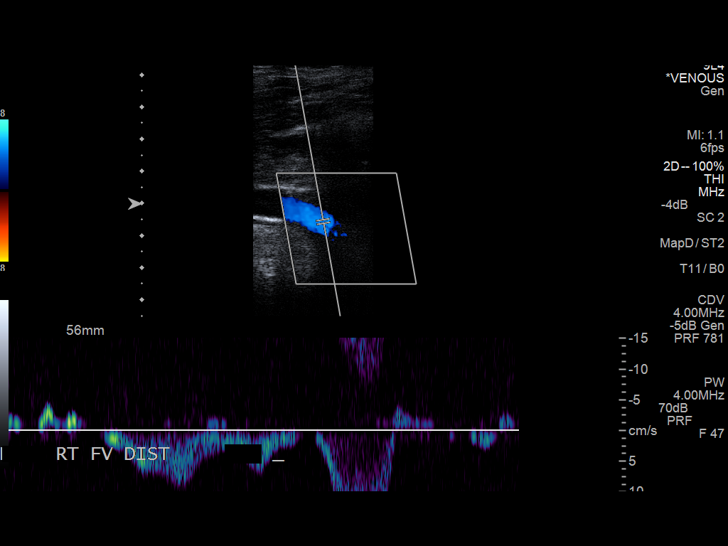
[im 30/68]
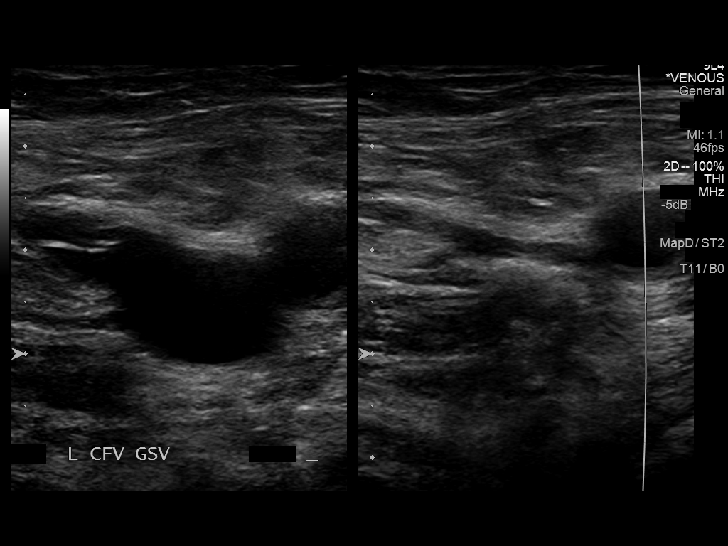
[im 35/68]
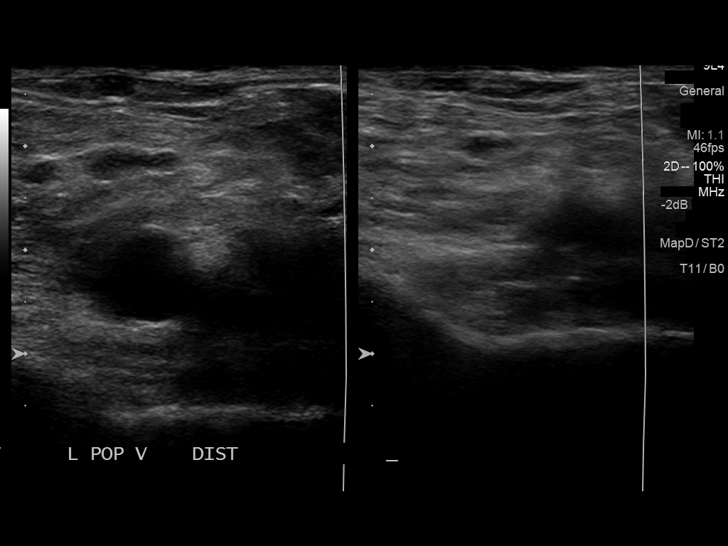
[im 38/68]
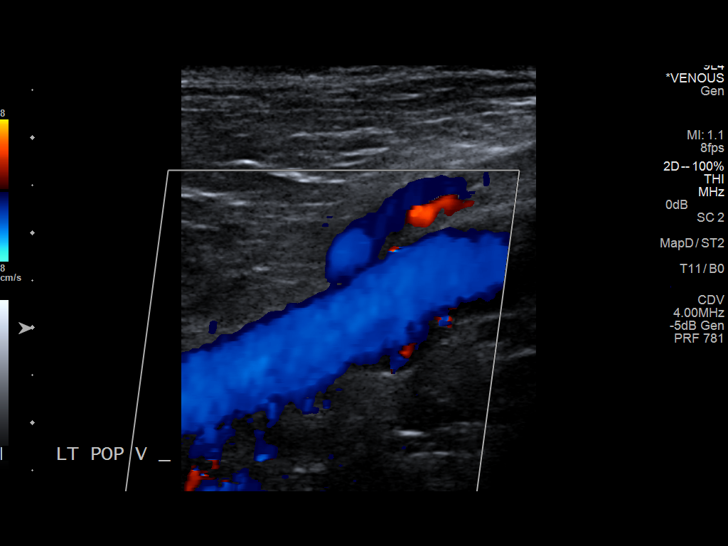
[im 44/68]
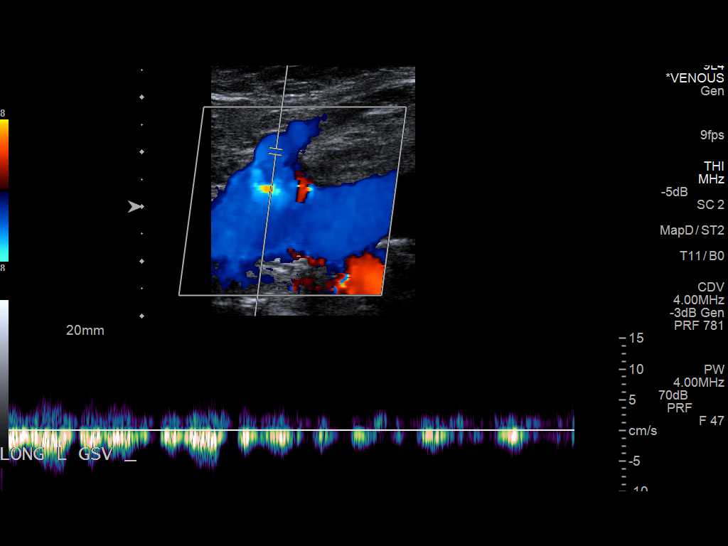
[im 50/68]
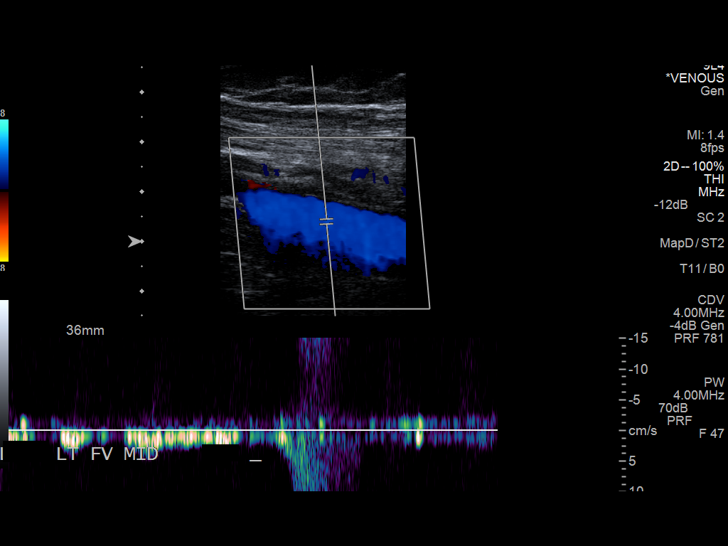
[im 56/68]
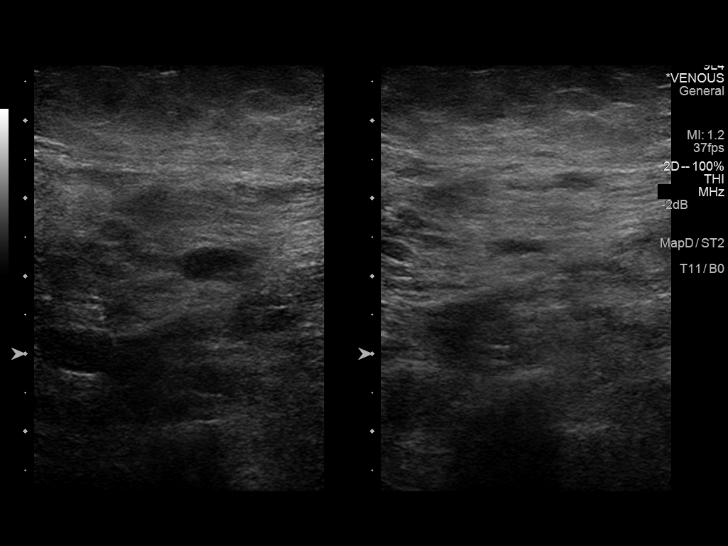
[im 62/68]
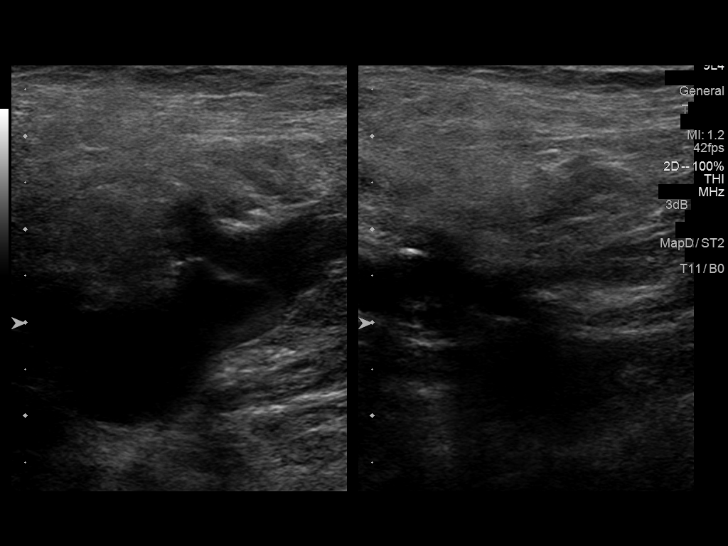
[im 68/68]
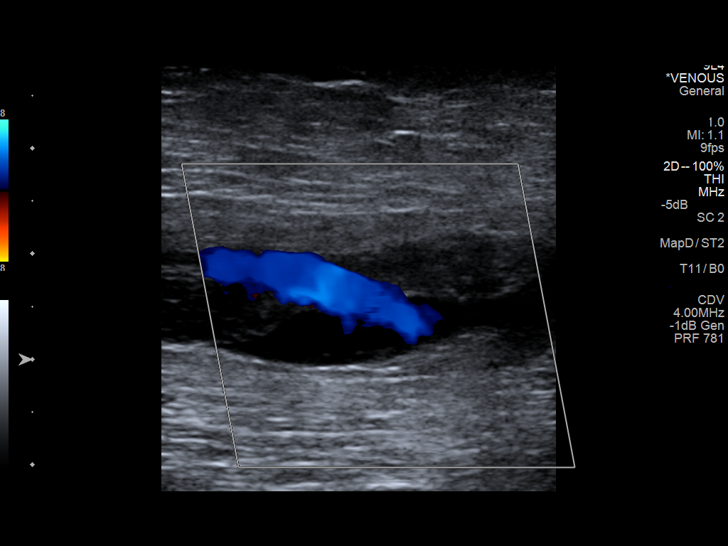

[13 of 24 positions shown; findings below may reference images not displayed]

FINDINGS: RIGHT LOWER EXTREMITY

Common Femoral Vein: No evidence of thrombus. Normal
compressibility, respiratory phasicity and response to augmentation.

Saphenofemoral Junction: No evidence of thrombus. Normal
compressibility and flow on color Doppler imaging.

Profunda Femoral Vein: No evidence of thrombus. Normal
compressibility and flow on color Doppler imaging.

Femoral Vein: No evidence of thrombus. Normal compressibility,
respiratory phasicity and response to augmentation.

Popliteal Vein: No evidence of thrombus. Normal compressibility,
respiratory phasicity and response to augmentation.

Calf Veins: No evidence of thrombus. Normal compressibility and flow
on color Doppler imaging.

Superficial Great Saphenous Vein: No evidence of thrombus. Normal
compressibility and flow on color Doppler imaging.

Other Findings:  None.

LEFT LOWER EXTREMITY

Common Femoral Vein: No evidence of thrombus. Normal
compressibility, respiratory phasicity and response to augmentation.

Saphenofemoral Junction: No evidence of thrombus. Normal
compressibility and flow on color Doppler imaging.

Profunda Femoral Vein: No evidence of thrombus. Normal
compressibility and flow on color Doppler imaging.

Femoral Vein: No evidence of thrombus. Normal compressibility,
respiratory phasicity and response to augmentation.

Popliteal Vein: No evidence of thrombus. Normal compressibility,
respiratory phasicity and response to augmentation.

Calf Veins: No evidence of thrombus. Normal compressibility and flow
on color Doppler imaging.

Superficial Great Saphenous Vein: No evidence of thrombus. Normal
compressibility and flow on color Doppler imaging.

Other Findings:  None.
IMPRESSION: Sonographic survey of the bilateral lower extremities negative for
DVT.

## 2016-05-06 ENCOUNTER — Ambulatory Visit (INDEPENDENT_AMBULATORY_CARE_PROVIDER_SITE_OTHER): Payer: Medicare Other

## 2016-05-06 ENCOUNTER — Telehealth: Payer: Self-pay | Admitting: Cardiovascular Disease

## 2016-05-06 DIAGNOSIS — I2699 Other pulmonary embolism without acute cor pulmonale: Secondary | ICD-10-CM

## 2016-05-06 DIAGNOSIS — Z5181 Encounter for therapeutic drug level monitoring: Secondary | ICD-10-CM | POA: Diagnosis not present

## 2016-05-06 DIAGNOSIS — I480 Paroxysmal atrial fibrillation: Secondary | ICD-10-CM

## 2016-05-06 LAB — POCT INR: INR: 4.7

## 2016-05-06 NOTE — Telephone Encounter (Signed)
Pt INR 4.7 at today's coumadin visit. Pt having LE angiography w/Dr. Gilda CreaseSchnier Oct 3.  Notified Vernona RiegerLaura at [facility] of 4.7 INR and coumadin dosage adjustment.   As INR was elevated, I s/w Erica in the [place] coumadin clinic to make changes. She advised to hold two days then take 1.5 tablets daily. Recheck in one week. Pt verbalized understanding.

## 2016-05-07 ENCOUNTER — Telehealth: Payer: Self-pay | Admitting: Cardiovascular Disease

## 2016-05-07 NOTE — Telephone Encounter (Signed)
Spoke to patient about bleeding. She was started on lomotil by PCP.   She reports that she also has a procedure on Tues for which she needs to come off warfarin. She has a history of PE after surgery (June 2016) though our protocol states within 6months she was previously kept in the hospital for low INR. CHADS2 score of 3 (HTN, CHF, DM).   Spoke to Dr. Mariah MillingGollan and he said ok with no Bridge. Hold warfarin until procedure Tuesday. Resume Tuesday after procedure.   She will go to ED if bleeding worse over weekend. Pt states understanding and appreciation.

## 2016-05-07 NOTE — Telephone Encounter (Signed)
Patient says she has diarrhea and some clotting and blood in stool.  Patient has already called pcp office and they have ordered lamotil .  Patient wants Dr. Mariah MillingGollan to know since she is on coumadin and it was 4.7.  Patient says the bleeding has decreased but she still has diarrhea and pink tinged toilet tissue.   Please call.

## 2016-05-10 ENCOUNTER — Other Ambulatory Visit: Payer: Self-pay | Admitting: Vascular Surgery

## 2016-05-10 ENCOUNTER — Other Ambulatory Visit
Admission: RE | Admit: 2016-05-10 | Discharge: 2016-05-10 | Disposition: A | Discharge status: Home / Self Care | Payer: Medicare Other | Source: Ambulatory Visit | Attending: Vascular Surgery | Admitting: Vascular Surgery

## 2016-05-10 DIAGNOSIS — L97529 Non-pressure chronic ulcer of other part of left foot with unspecified severity: Secondary | ICD-10-CM | POA: Diagnosis not present

## 2016-05-10 DIAGNOSIS — Z01812 Encounter for preprocedural laboratory examination: Secondary | ICD-10-CM | POA: Diagnosis present

## 2016-05-10 DIAGNOSIS — I70245 Atherosclerosis of native arteries of left leg with ulceration of other part of foot: Secondary | ICD-10-CM | POA: Insufficient documentation

## 2016-05-10 LAB — BUN: BUN: 21 mg/dL — AB (ref 6–20)

## 2016-05-10 LAB — CREATININE, SERUM
CREATININE: 0.79 mg/dL (ref 0.44–1.00)
GFR calc Af Amer: >60 mL/min (ref >60)

## 2016-05-11 ENCOUNTER — Encounter
Admission: RE | Disposition: A | Discharge status: Home / Self Care | Payer: Self-pay | Source: Ambulatory Visit | Attending: Vascular Surgery

## 2016-05-11 ENCOUNTER — Ambulatory Visit
Admission: RE | Admit: 2016-05-11 | Discharge: 2016-05-11 | Disposition: A | Discharge status: Home / Self Care | Payer: Medicare Other | Source: Ambulatory Visit | Attending: Vascular Surgery | Admitting: Vascular Surgery

## 2016-05-11 ENCOUNTER — Encounter: Payer: Self-pay | Admitting: *Deleted

## 2016-05-11 DIAGNOSIS — I110 Hypertensive heart disease with heart failure: Secondary | ICD-10-CM | POA: Diagnosis not present

## 2016-05-11 DIAGNOSIS — Z79899 Other long term (current) drug therapy: Secondary | ICD-10-CM | POA: Diagnosis not present

## 2016-05-11 DIAGNOSIS — E119 Type 2 diabetes mellitus without complications: Secondary | ICD-10-CM | POA: Insufficient documentation

## 2016-05-11 DIAGNOSIS — E785 Hyperlipidemia, unspecified: Secondary | ICD-10-CM | POA: Diagnosis not present

## 2016-05-11 DIAGNOSIS — I70201 Unspecified atherosclerosis of native arteries of extremities, right leg: Secondary | ICD-10-CM | POA: Insufficient documentation

## 2016-05-11 DIAGNOSIS — I5032 Chronic diastolic (congestive) heart failure: Secondary | ICD-10-CM | POA: Diagnosis not present

## 2016-05-11 DIAGNOSIS — I70248 Atherosclerosis of native arteries of left leg with ulceration of other part of lower left leg: Secondary | ICD-10-CM

## 2016-05-11 DIAGNOSIS — Z7901 Long term (current) use of anticoagulants: Secondary | ICD-10-CM | POA: Insufficient documentation

## 2016-05-11 DIAGNOSIS — I70245 Atherosclerosis of native arteries of left leg with ulceration of other part of foot: Secondary | ICD-10-CM | POA: Insufficient documentation

## 2016-05-11 DIAGNOSIS — L97529 Non-pressure chronic ulcer of other part of left foot with unspecified severity: Secondary | ICD-10-CM | POA: Diagnosis not present

## 2016-05-11 HISTORY — PX: PERIPHERAL VASCULAR CATHETERIZATION: SHX172C

## 2016-05-11 LAB — PROTIME-INR
INR: 1.28
PROTHROMBIN TIME: 16.1 s — AB (ref 11.4–15.2)

## 2016-05-11 SURGERY — LOWER EXTREMITY ANGIOGRAPHY
Anesthesia: Moderate Sedation | Site: Leg Lower | Laterality: Left

## 2016-05-11 MED ORDER — ONDANSETRON HCL 4 MG/2ML IJ SOLN: 4.0000 mg | Freq: Four times a day (QID) | INTRAMUSCULAR | Status: DC | PRN

## 2016-05-11 MED ORDER — SODIUM CHLORIDE 0.9 % IV SOLN
INTRAVENOUS | Status: DC
  Administered 2016-05-11: 10:00:00 via INTRAVENOUS

## 2016-05-11 MED ORDER — HYDRALAZINE HCL 20 MG/ML IJ SOLN: 5.0000 mg | INTRAMUSCULAR | Status: DC | PRN

## 2016-05-11 MED ORDER — ALUM & MAG HYDROXIDE-SIMETH 200-200-20 MG/5ML PO SUSP: 15.0000 mL | ORAL | Status: DC | PRN

## 2016-05-11 MED ORDER — HEPARIN SODIUM (PORCINE) 1000 UNIT/ML IJ SOLN
INTRAMUSCULAR | Status: DC | PRN
  Administered 2016-05-11: 5000 [IU] via INTRAVENOUS

## 2016-05-11 MED ORDER — IOPAMIDOL (ISOVUE-300) INJECTION 61%
INTRAVENOUS | Status: DC | PRN
  Administered 2016-05-11: 90 mL via INTRAVENOUS

## 2016-05-11 MED ORDER — MORPHINE SULFATE (PF) 4 MG/ML IV SOLN: 2.0000 mg | INTRAVENOUS | Status: DC | PRN

## 2016-05-11 MED ORDER — ACETAMINOPHEN 325 MG RE SUPP: 325.0000 mg | RECTAL | Status: DC | PRN

## 2016-05-11 MED ORDER — HYDROMORPHONE HCL 1 MG/ML IJ SOLN: 1.0000 mg | Freq: Once | INTRAMUSCULAR | Status: DC

## 2016-05-11 MED ORDER — MIDAZOLAM HCL 2 MG/2ML IJ SOLN
INTRAMUSCULAR | Status: DC | PRN
  Administered 2016-05-11: 2 mg via INTRAVENOUS
  Administered 2016-05-11 (×2): 1 mg via INTRAVENOUS

## 2016-05-11 MED ORDER — FENTANYL CITRATE (PF) 100 MCG/2ML IJ SOLN
INTRAMUSCULAR | Status: AC
  Filled 2016-05-11: qty 2

## 2016-05-11 MED ORDER — HEPARIN (PORCINE) IN NACL 2-0.9 UNIT/ML-% IJ SOLN
INTRAMUSCULAR | Status: AC
  Filled 2016-05-11: qty 1000

## 2016-05-11 MED ORDER — CEFUROXIME SODIUM 1.5 G IJ SOLR: 1.5000 g | INTRAMUSCULAR | Status: DC

## 2016-05-11 MED ORDER — METHYLPREDNISOLONE SODIUM SUCC 125 MG IJ SOLR: 125.0000 mg | INTRAMUSCULAR | Status: DC | PRN

## 2016-05-11 MED ORDER — MIDAZOLAM HCL 2 MG/ML PO SYRP
10.0000 mg | ORAL_SOLUTION | Freq: Once | ORAL | Status: AC
  Administered 2016-05-11: 10 mg via ORAL

## 2016-05-11 MED ORDER — HEPARIN SODIUM (PORCINE) 1000 UNIT/ML IJ SOLN
INTRAMUSCULAR | Status: AC
  Filled 2016-05-11: qty 1

## 2016-05-11 MED ORDER — ACETAMINOPHEN 325 MG PO TABS: 325.0000 mg | ORAL_TABLET | ORAL | Status: DC | PRN

## 2016-05-11 MED ORDER — LIDOCAINE HCL (PF) 1 % IJ SOLN
INTRAMUSCULAR | Status: AC
  Filled 2016-05-11: qty 30

## 2016-05-11 MED ORDER — PANTOPRAZOLE SODIUM 40 MG PO TBEC: 40.0000 mg | DELAYED_RELEASE_TABLET | Freq: Every day | ORAL | Status: DC

## 2016-05-11 MED ORDER — FAMOTIDINE 20 MG PO TABS: 40.0000 mg | ORAL_TABLET | ORAL | Status: DC | PRN

## 2016-05-11 MED ORDER — DOCUSATE SODIUM 100 MG PO CAPS: 100.0000 mg | ORAL_CAPSULE | Freq: Every day | ORAL | Status: DC

## 2016-05-11 MED ORDER — METOPROLOL TARTRATE 5 MG/5ML IV SOLN: 5.0000 mg | Freq: Four times a day (QID) | INTRAVENOUS | Status: DC

## 2016-05-11 MED ORDER — MIDAZOLAM HCL 5 MG/5ML IJ SOLN
INTRAMUSCULAR | Status: AC
  Filled 2016-05-11: qty 5

## 2016-05-11 MED ORDER — FENTANYL CITRATE (PF) 100 MCG/2ML IJ SOLN
INTRAMUSCULAR | Status: DC | PRN
  Administered 2016-05-11 (×3): 50 ug via INTRAVENOUS

## 2016-05-11 MED ORDER — LABETALOL HCL 5 MG/ML IV SOLN: 10.0000 mg | INTRAVENOUS | Status: DC | PRN

## 2016-05-11 SURGICAL SUPPLY — 27 items
BALLN ULTRVRSE 2.5X220X150 (BALLOONS) ×3
BALLN ULTRVRSE 2X300X150 (BALLOONS) ×3
BALLN ULTRVRSE 2X300X150 OTW (BALLOONS) ×2
BALLOON ULTRVRSE 2.5X220X150 (BALLOONS) IMPLANT
BALLOON ULTRVRSE 2X300X150 OTW (BALLOONS) IMPLANT
CATH CROSSER S6 154CM (CATHETERS) ×1 IMPLANT
CATH GWIRE MARINER STRGHT 4FR (CATHETERS) ×1 IMPLANT
CATH PIG 70CM (CATHETERS) ×3 IMPLANT
CATH USHER TPER 130CM (CATHETERS) ×1 IMPLANT
DEVICE PRESTO INFLATION (MISCELLANEOUS) ×1 IMPLANT
DEVICE STARCLOSE SE CLOSURE (Vascular Products) ×1 IMPLANT
DEVICE TORQUE (MISCELLANEOUS) ×1 IMPLANT
GLIDEWIRE ANGLED SS 035X260CM (WIRE) ×1 IMPLANT
GUIDEWIRE PFTE-COATED .018X300 (WIRE) ×1 IMPLANT
IV NS 250ML (IV SOLUTION) ×3
IV NS 250ML BAXH (IV SOLUTION) IMPLANT
KIT FLOWMATE PROCEDURAL (MISCELLANEOUS) ×1 IMPLANT
PACK ANGIOGRAPHY (CUSTOM PROCEDURE TRAY) ×3 IMPLANT
SET INTRO CAPELLA COAXIAL (SET/KITS/TRAYS/PACK) ×3 IMPLANT
SHEATH BRITE TIP 5FRX11 (SHEATH) ×3 IMPLANT
SHEATH BRITE TIP 6FRX11 (SHEATH) ×1 IMPLANT
SHEATH RAABE 6FR (SHEATH) ×1 IMPLANT
SYR MEDRAD MARK V 150ML (SYRINGE) ×3 IMPLANT
TOWEL OR 17X26 4PK STRL BLUE (TOWEL DISPOSABLE) ×2 IMPLANT
TUBING CONTRAST HIGH PRESS 72 (TUBING) ×3 IMPLANT
VALVE COPILOT STAT (MISCELLANEOUS) ×1 IMPLANT
WIRE J 3MM .035X145CM (WIRE) ×3 IMPLANT

## 2016-05-11 NOTE — H&P (Signed)
[facility] History & Physical Update  The patient was interviewed and re-examined.  The patient's previous History and Physical has been reviewed and is unchanged.  There is no change in the plan of care. We plan to proceed with the scheduled procedure.  Roniyah Llorens, MD  05/11/2016, 9:08 AM   

## 2016-05-11 NOTE — Discharge Instructions (Signed)

## 2016-05-11 NOTE — Op Note (Signed)
[facility] Percutaneous Study/Intervention Procedural Note   Date of Surgery: 05/11/2016  Surgeon:  Katha Cabal, MD.  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 Introduction catheter into left lower extremity 3rd order catheter placement  2. Contrast injection left lower extremity for distal runoff   3. Crosser atherectomy of the left posterior tibial artery 4.  Percutaneous transluminal angioplasty to 2.5 mm left posterior tibial artery             5.   Star close closure right common femoral arteriotomy  Anesthesia: Conscious sedation was administered under my direct supervision. IV Versed plus fentanyl were utilized. Continuous ECG, pulse oximetry and blood pressure was monitored throughout the entire procedure.  Conscious sedation was for a total of 1 hour 45 minutes.  Sheath: 6 [place] rabi right common femoral retrograde  Contrast: 90 cc  Fluoroscopy Time: 13.8 minutes  Indications: [name] presents with ulceration of the left foot. The risks and benefits are reviewed all questions answered patient agrees to proceed.  Procedure: [name] is a 65 y.o. y.o. female who was identified and appropriate procedural time out was performed. The patient was then placed supine on the table and prepped and draped in the usual sterile fashion.   Ultrasound was placed in the sterile sleeve and the right groin was evaluated the right common femoral artery was echolucent and pulsatile indicating patency.  Image was recorded for the permanent record and under real-time visualization a microneedle was inserted into the common femoral artery microwire followed by a micro-sheath.  A J-wire was then advanced through the micro-sheath and a  5 [place] sheath was then  inserted over a J-wire. J-wire was then advanced and a 5 French pigtail catheter was positioned at the level of T12. AP projection of the aorta was then obtained. Pigtail catheter was repositioned to above the bifurcation and a RAO view of the pelvis was obtained.  Subsequently a pigtail catheter with the stiff angle Glidewire was used to cross the aortic bifurcation the catheter wire were advanced down into the left distal external iliac artery. Oblique view of the femoral bifurcation was then obtained and subsequently the wire was reintroduced and the pigtail catheter negotiated into the SFA representing third order catheter placement. Distal runoff was then performed.  5000 units of heparin was then given and allowed to circulate and a 6 [place] rabi sheath was advanced up and over the bifurcation and positioned in the superficial femoral artery  The S6 Crosser catheter was then prepped on the field and a straight pressure catheter was advanced into the cul-de-sac of the posterior tibial artery under magnified imaging. Using the Crosser catheter the occlusion of the posterior tibial and lateral plantar arteries was negotiated.  The artery catheter and 018 advantage wire were then advanced down into the distal lateral plantar.  Distal runoff was then completed by hand injection through the catheter and hand injection of contrast was used to verify intraluminal placement distally.    A 2 mm x 300 mm balloon was used to angioplasty the posterior tibial and proximal lateral plantar arteries. Inflations were to 12 atmospheres for 2 minutes. Follow-up imaging demonstrated patency with inadequate luminal gain. Therefore a 2.5 mm x 200 mm balloon was advanced over the wire and the posterior tibial artery was retreated. Again, inflations were to 12 atm for 2 minutes.   Distal  runoff was then reassessed.  After review of these images the sheath is pulled into the right external iliac oblique of the common femoral is  obtained and a Star close device deployed. There no immediate Complications.  Findings: The abdominal aorta is opacified with a bolus injection contrast. Renal arteries are widely patent. Previously placed left renal artery stent is widely patent with minimal restenosis.. The aorta itself has diffuse disease but no hemodynamically significant lesions. The common and external iliac arteries are widely patent bilaterally.  The left common femoral is widely patent as is the profunda femoris.  The left superficial femoral-popliteal arteries are widely patent. Trifurcation demonstrates significant disease the anterior tibial occludes approximately 1 cm distal to the origin remains occluded throughout its entire course. There is minimal if any reconstitution of the dorsalis pedis. The peroneal is widely patent throughout it's course and does demonstrate extensive collaterals across the foot with filling of the lateral plantar and medial plantar branches and the pedal arch. The posterior tibial is a faint string at its origin and then occludes and remains occluded down to the level of the plantar bifurcation.  The Crosser catheter is then advanced through the posterior tibial with successful atherectomy.  Following angioplasty a tibial  tibial now is in-line flow and looks quite nice. There is preservation of the peroneal as well.   Disposition: Patient was taken to the recovery room in stable condition having tolerated the procedure well.  [name] 05/11/2016,1:42 PM

## 2016-05-12 ENCOUNTER — Encounter: Payer: Self-pay | Admitting: Vascular Surgery

## 2016-05-13 IMAGING — CR DG CHEST 2V
2 series · 2 of 2 positions shown · non-contrast
Comparison: 12/27/2014

CLINICAL DATA: Left side chest pain jaw pain arm pain

EXAM:
CHEST  2 VIEW

[chest pa]
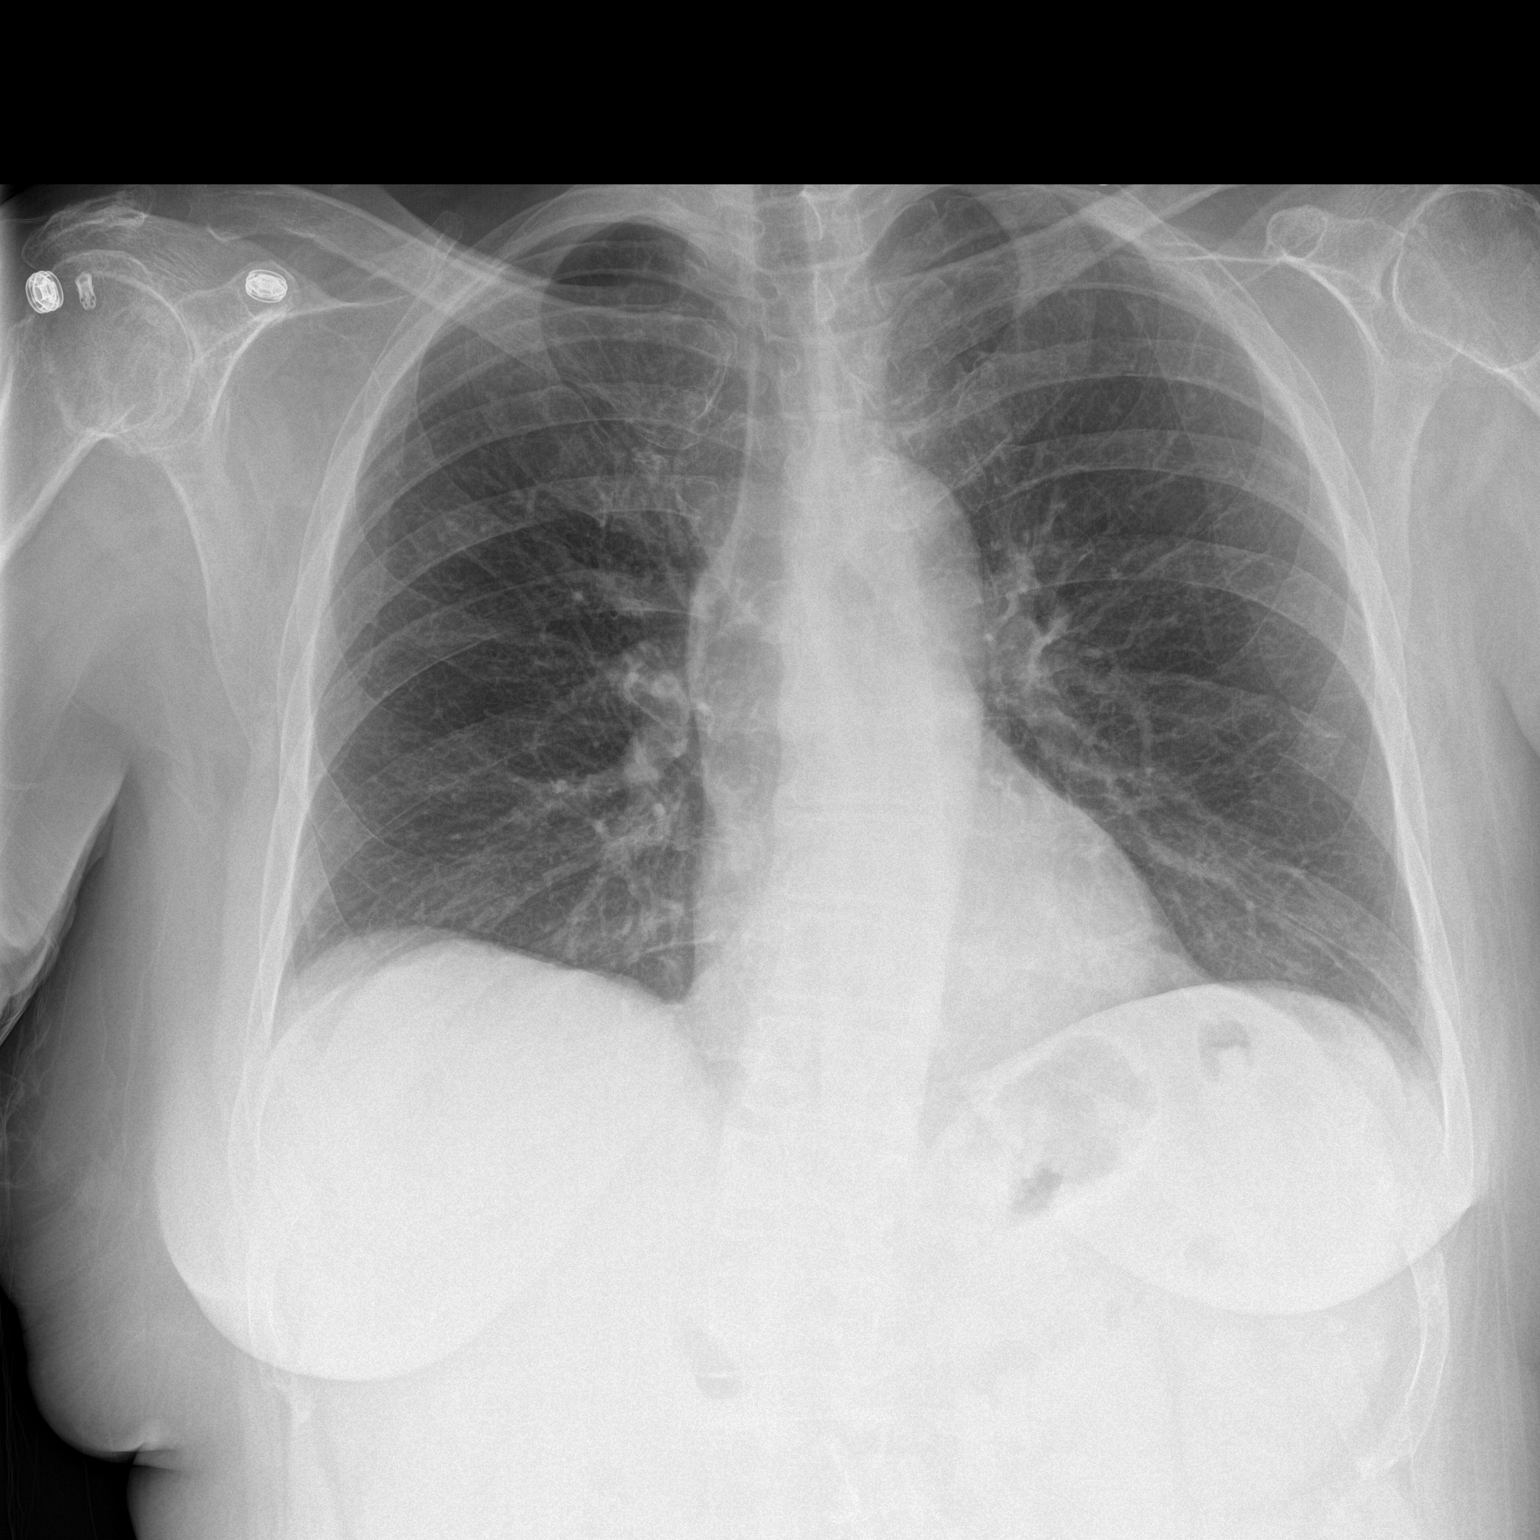

[chest lat]
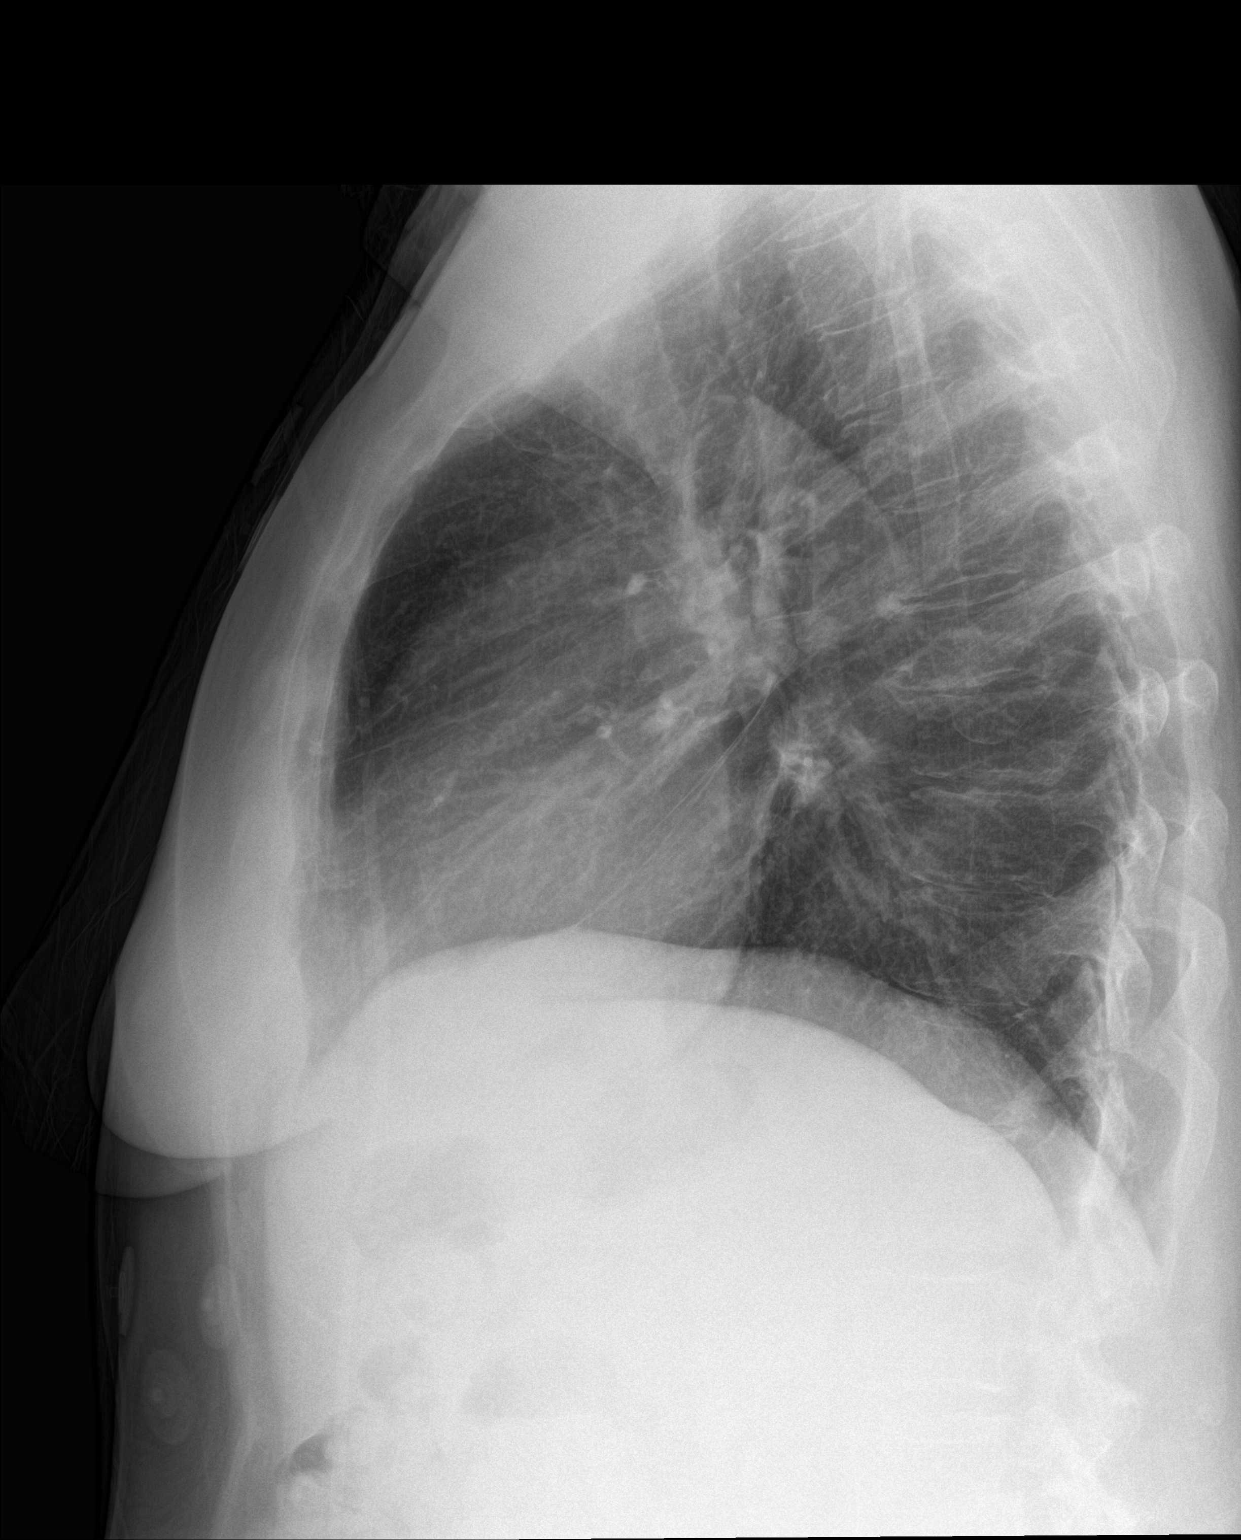

[2 of 2 positions shown; findings below may reference images not displayed]

FINDINGS: The heart size and mediastinal contours are within normal limits.
Both lungs are clear. The visualized skeletal structures are
unremarkable.
IMPRESSION: No active cardiopulmonary disease.

## 2016-05-16 IMAGING — CR DG CHEST 1V PORT
1 series · 1 of 1 positions shown · non-contrast
Comparison: Portable exam 8428 hours compared to 01/07/2015

CLINICAL DATA: Post CABG

EXAM:
PORTABLE CHEST - 1 VIEW

[AP]
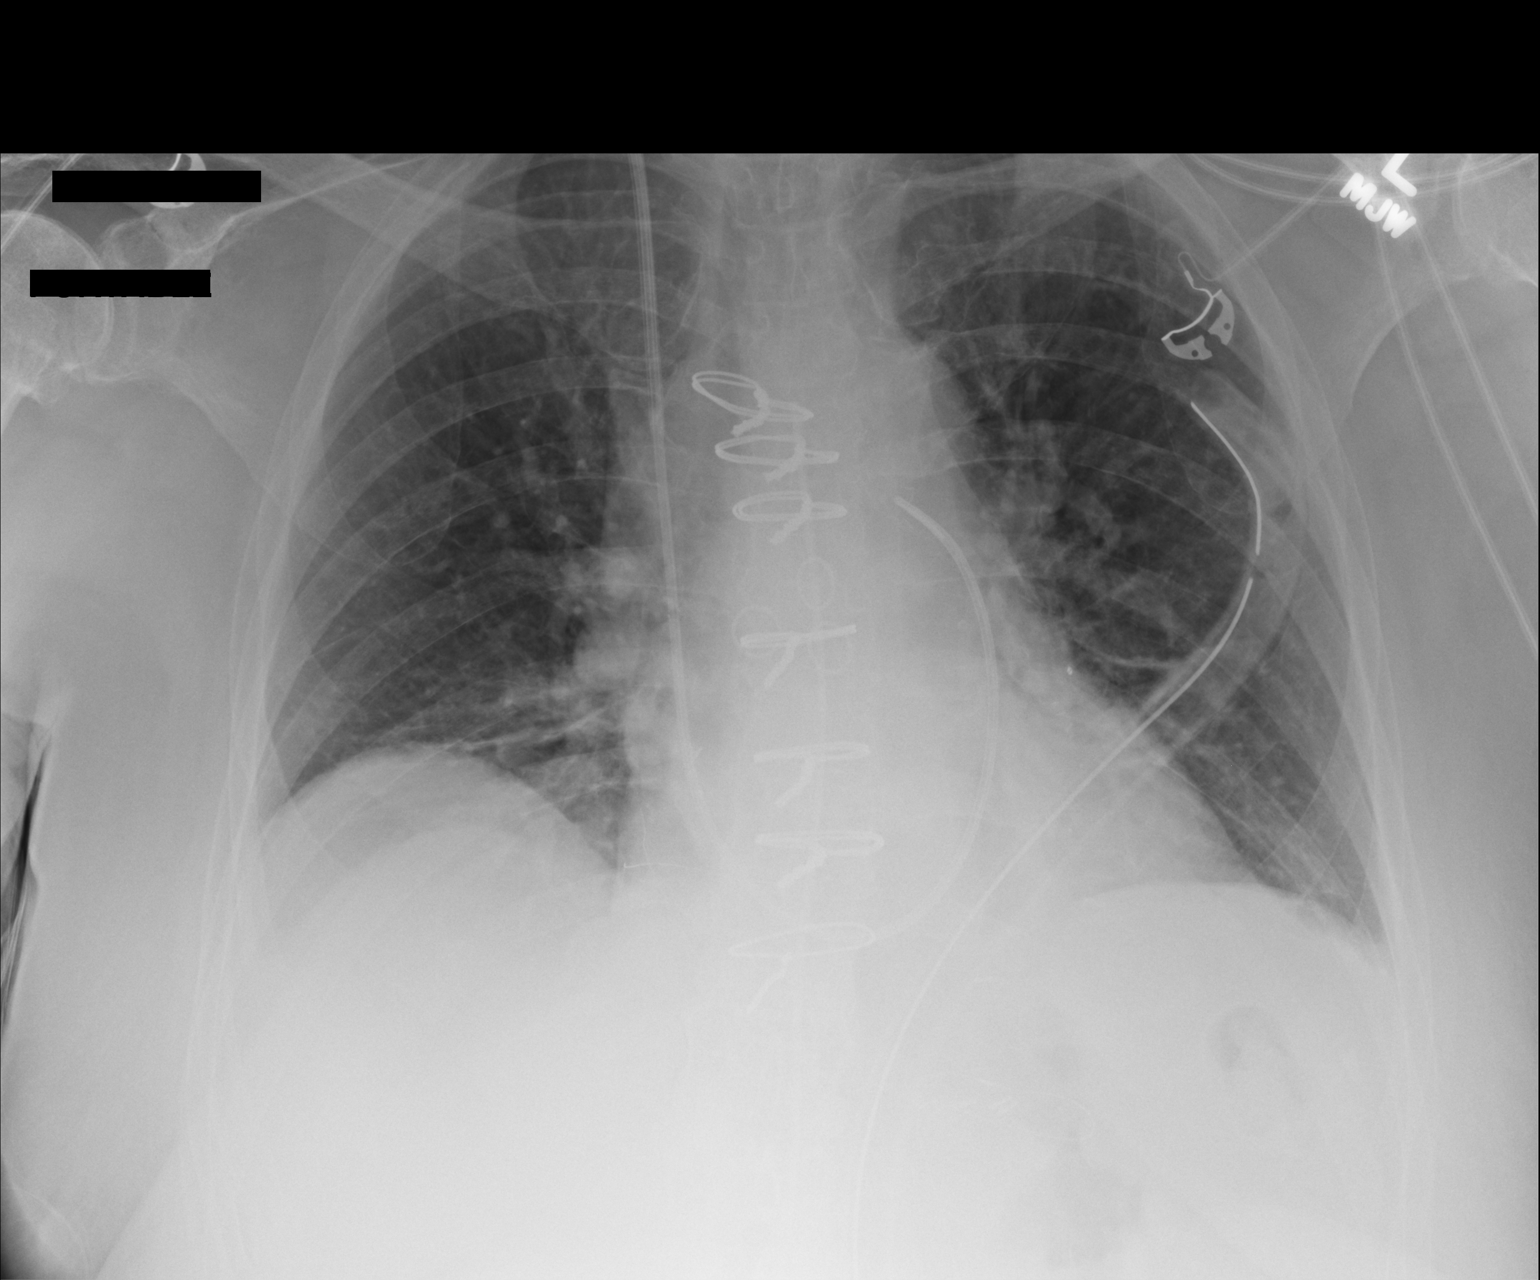

[1 of 1 positions shown; findings below may reference images not displayed]

FINDINGS: Interval removal of endotracheal and nasogastric tubes.

Mediastinal drain and LEFT thoracostomy tube remain.

RIGHT jugular Swan-Ganz catheter remains tip projecting over
bifurcation of main pulmonary artery.

Enlargement of cardiac silhouette post CABG.

Slight pulmonary vascular congestion.

Minimal linear subsegmental atelectasis at both lung bases.

Lungs otherwise clear.

No gross pleural effusion or pneumothorax.
IMPRESSION: Minimal bibasilar atelectasis.

Enlargement of cardiac silhouette post CABG.

## 2016-05-18 IMAGING — CR DG CHEST 2V
2 series · 2 of 2 positions shown · non-contrast
Comparison: 01/09/2015.

CLINICAL DATA: Subsequent encounter for recent CABG.

EXAM:
CHEST  2 VIEW

[chest pa]
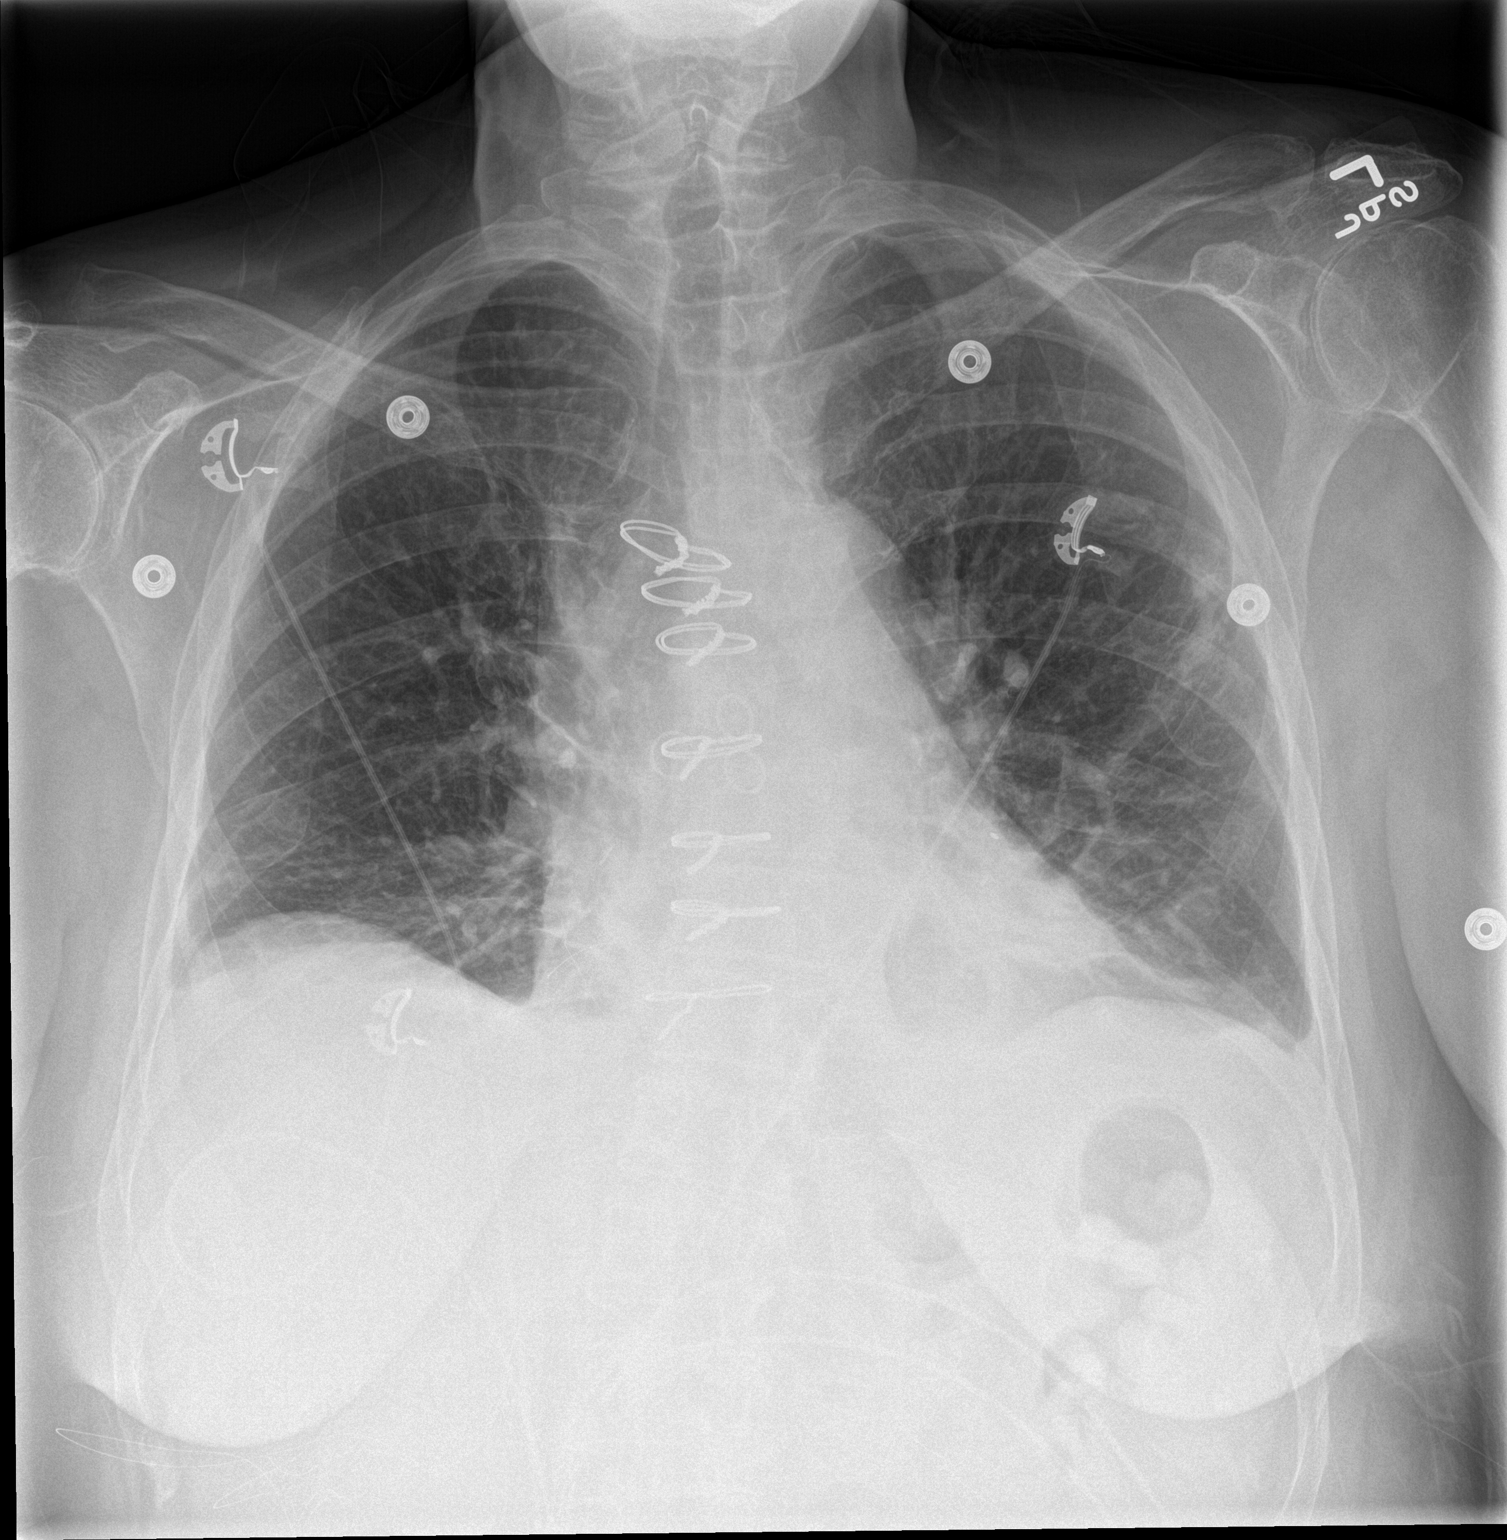

[chest lat]
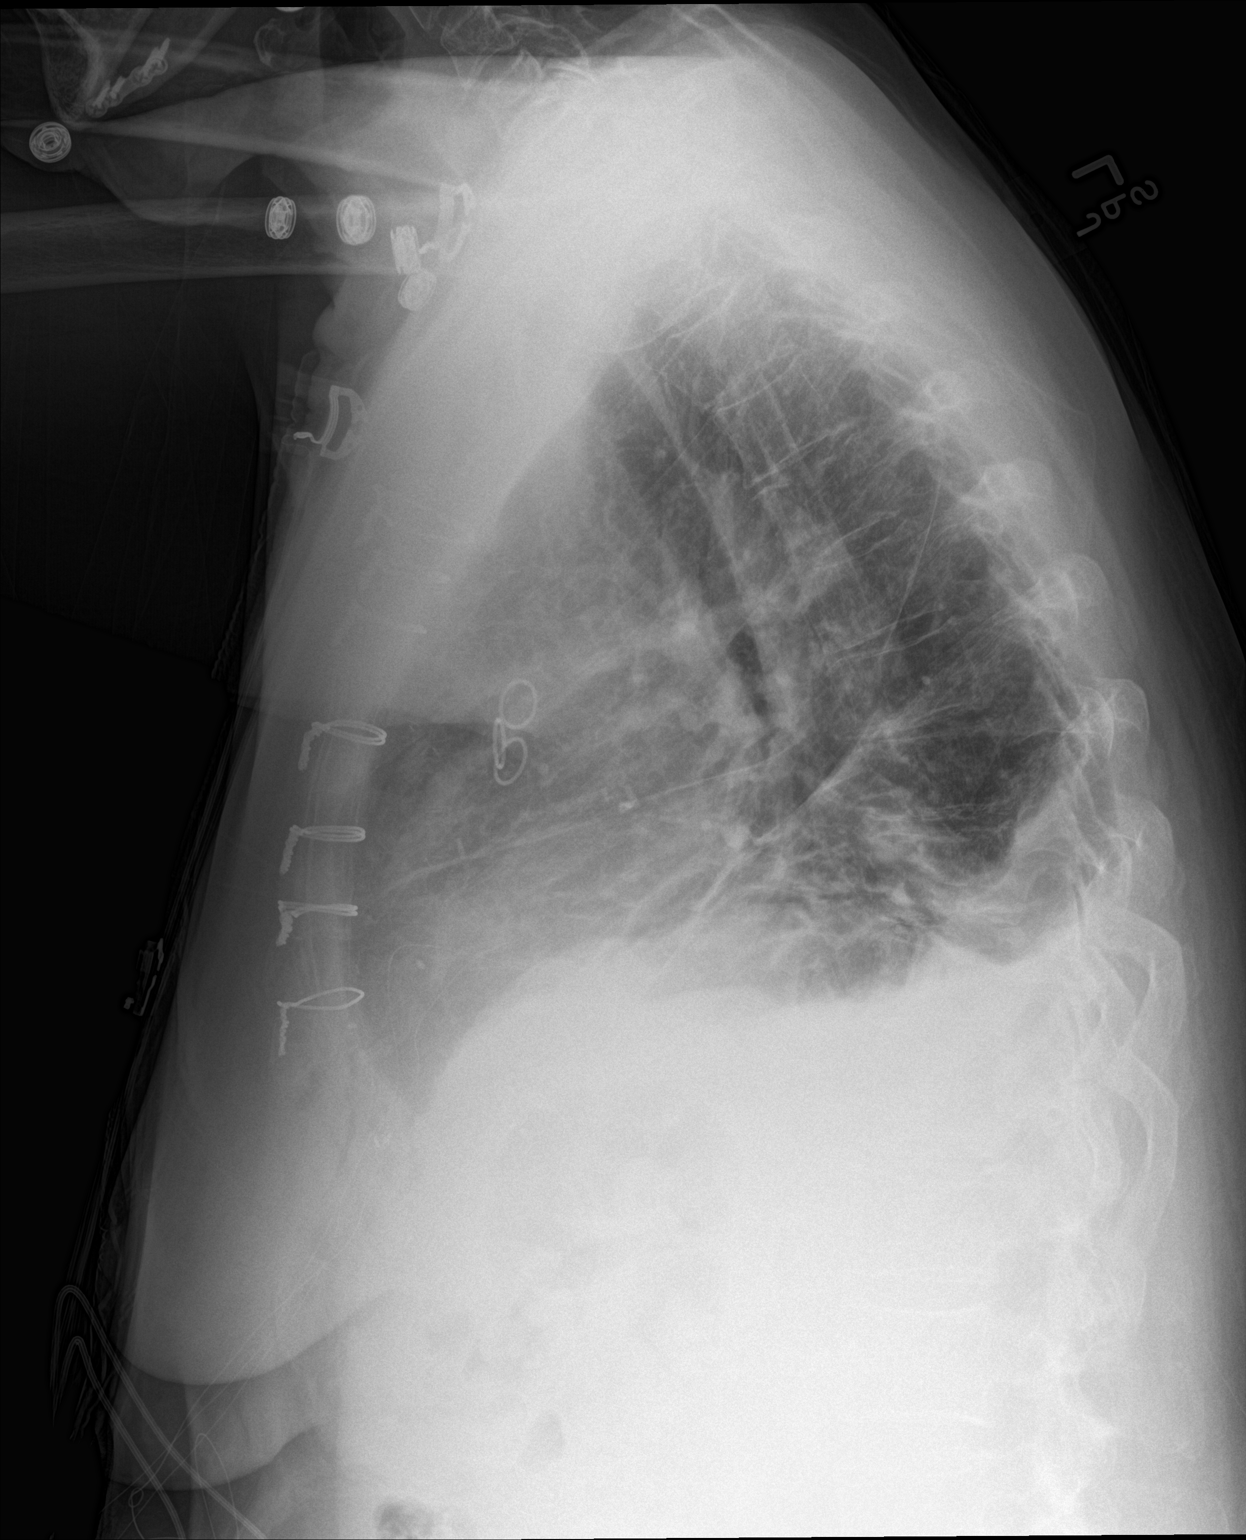

[2 of 2 positions shown; findings below may reference images not displayed]

FINDINGS: Right IJ sheath has been removed in the interval. Left chest tube
has been removed. The midline mediastinal/ pericardial drain is no
longer evident. Bibasilar atelectasis, left greater than right
noted. Tiny bilateral pleural effusions are evident. Small flame
shaped opacity in the left mid lung may be related to prior chest
tube placement. The cardio pericardial silhouette is enlarged.
Telemetry leads overlie the chest. Bones are diffusely
demineralized.
IMPRESSION: Interval removal of right IJ sheath, mediastinal/ pericardial drain,
and left chest tube.

No evidence for pneumothorax.

Bibasilar atelectasis with tiny bilateral pleural effusions.

## 2016-05-19 ENCOUNTER — Ambulatory Visit (INDEPENDENT_AMBULATORY_CARE_PROVIDER_SITE_OTHER): Payer: Medicare Other

## 2016-05-19 DIAGNOSIS — Z5181 Encounter for therapeutic drug level monitoring: Secondary | ICD-10-CM | POA: Diagnosis not present

## 2016-05-19 DIAGNOSIS — I2699 Other pulmonary embolism without acute cor pulmonale: Secondary | ICD-10-CM | POA: Diagnosis not present

## 2016-05-19 DIAGNOSIS — I480 Paroxysmal atrial fibrillation: Secondary | ICD-10-CM | POA: Diagnosis not present

## 2016-05-19 LAB — POCT INR: INR: 2.4

## 2016-05-28 ENCOUNTER — Other Ambulatory Visit (INDEPENDENT_AMBULATORY_CARE_PROVIDER_SITE_OTHER): Payer: Self-pay | Admitting: Vascular Surgery

## 2016-05-28 DIAGNOSIS — I70248 Atherosclerosis of native arteries of left leg with ulceration of other part of lower left leg: Secondary | ICD-10-CM

## 2016-05-31 ENCOUNTER — Ambulatory Visit (INDEPENDENT_AMBULATORY_CARE_PROVIDER_SITE_OTHER): Payer: Medicare Other | Admitting: Vascular Surgery

## 2016-05-31 ENCOUNTER — Ambulatory Visit (INDEPENDENT_AMBULATORY_CARE_PROVIDER_SITE_OTHER): Payer: Medicare Other

## 2016-05-31 ENCOUNTER — Encounter (INDEPENDENT_AMBULATORY_CARE_PROVIDER_SITE_OTHER): Payer: Self-pay | Admitting: Vascular Surgery

## 2016-05-31 VITALS — BP 149/83 | HR 64 | Resp 16 | Ht 68.0 in | Wt 181.0 lb

## 2016-05-31 DIAGNOSIS — I70213 Atherosclerosis of native arteries of extremities with intermittent claudication, bilateral legs: Secondary | ICD-10-CM | POA: Diagnosis not present

## 2016-05-31 DIAGNOSIS — I7025 Atherosclerosis of native arteries of other extremities with ulceration: Secondary | ICD-10-CM | POA: Insufficient documentation

## 2016-05-31 DIAGNOSIS — I70248 Atherosclerosis of native arteries of left leg with ulceration of other part of lower left leg: Secondary | ICD-10-CM

## 2016-05-31 DIAGNOSIS — I10 Essential (primary) hypertension: Secondary | ICD-10-CM | POA: Diagnosis not present

## 2016-05-31 DIAGNOSIS — E1159 Type 2 diabetes mellitus with other circulatory complications: Secondary | ICD-10-CM

## 2016-05-31 DIAGNOSIS — I25810 Atherosclerosis of coronary artery bypass graft(s) without angina pectoris: Secondary | ICD-10-CM | POA: Diagnosis not present

## 2016-05-31 DIAGNOSIS — I200 Unstable angina: Secondary | ICD-10-CM

## 2016-05-31 DIAGNOSIS — I70219 Atherosclerosis of native arteries of extremities with intermittent claudication, unspecified extremity: Secondary | ICD-10-CM | POA: Insufficient documentation

## 2016-06-01 ENCOUNTER — Telehealth: Payer: Self-pay | Admitting: Cardiovascular Disease

## 2016-06-01 NOTE — Telephone Encounter (Signed)
[place] [facility] [place]

## 2016-06-09 ENCOUNTER — Ambulatory Visit (INDEPENDENT_AMBULATORY_CARE_PROVIDER_SITE_OTHER): Payer: Medicare Other

## 2016-06-09 DIAGNOSIS — I480 Paroxysmal atrial fibrillation: Secondary | ICD-10-CM | POA: Diagnosis not present

## 2016-06-09 DIAGNOSIS — I2699 Other pulmonary embolism without acute cor pulmonale: Secondary | ICD-10-CM

## 2016-06-09 DIAGNOSIS — Z5181 Encounter for therapeutic drug level monitoring: Secondary | ICD-10-CM | POA: Diagnosis not present

## 2016-06-09 LAB — POCT INR: INR: 3

## 2016-06-13 NOTE — Progress Notes (Signed)
[facility] Admission History & Physical  MRN : [id number]  Dominique Maffucciamela K Noda is a 65 y.o. (November 25, 1950) female who presents with chief complaint of  Chief Complaint  Patient presents with  . Routine Post Op    Ultrasound follow up  .  History of Present Illness:    The patient returns to the office for followup and review status post angiogram with intervention of the left PT on 05/11/2016. The patient notes improvement in the left lower extremity symptoms with healing of the ulcer of her toe. No interval shortening of the patient's claudication distance or rest pain symptoms. Previous wounds have now healed.  No new ulcers or wounds have occurred since the last visit.  There have been no significant changes to the patient's overall health care.  The patient denies amaurosis fugax or recent TIA symptoms. There are no recent neurological changes noted. The patient denies history of DVT, PE or superficial thrombophlebitis. The patient denies recent episodes of angina or shortness of breath.   ABI's Rt=[place] toe index 0.42 and Lt=1.09 toe index 0.73  (previous ABI's Rt=[place] and Lt=0.97)   Current Meds  Medication Sig  . aspirin 81 MG EC tablet Take 1 tablet (81 mg total) by mouth daily.  Marland Kitchen. atenolol (TENORMIN) 100 MG tablet Take 0.5 tablets (50 mg total) by mouth daily.  . furosemide (LASIX) 20 MG tablet Take 1 tablet (20 mg total) by mouth daily as needed.  Marland Kitchen. guaiFENesin (MUCINEX) 600 MG 12 hr tablet Take 1 tablet (600 mg total) by mouth daily. Takes 1 tab daily, can take addt'l tab as needed for mucus  . isosorbide mononitrate (IMDUR) 30 MG 24 hr tablet Take 1 tablet (30 mg total) by mouth 2 (two) times daily.  Marland Kitchen. losartan (COZAAR) 25 MG tablet Take 0.5 tablets (12.5 mg total) by mouth at bedtime.  . nitroGLYCERIN (NITROSTAT) 0.4 MG SL tablet Place 1 tablet (0.4 mg total) under the tongue every 5 (five) minutes as needed for chest pain.  Marland Kitchen. norethindrone-ethinyl  estradiol-iron (MICROGESTIN FE,GILDESS FE,LOESTRIN FE) 1.5-30 MG-MCG tablet Take 1 tablet by mouth daily.  . potassium chloride (K-DUR) 10 MEQ tablet Take 1 tablet (10 mEq total) by mouth 2 (two) times daily as needed.  . rosuvastatin (CRESTOR) 40 MG tablet Take 1 tablet (40 mg total) by mouth daily.  . sitaGLIPtin (JANUVIA) 100 MG tablet Take 100 mg by mouth daily.    Marland Kitchen. warfarin (COUMADIN) 5 MG tablet Take as directed by [facility]  . zolpidem (AMBIEN) 10 MG tablet Take 10 mg by mouth at bedtime as needed.      Past Medical History:  Diagnosis Date  . Acute on chronic diastolic CHF (congestive heart failure) (HCC) 01/24/2015  . Anemia   . Anxiety   . Coronary artery disease    Status post coronary stenting approximately 10 years ago  . Depression   . Hyperlipidemia   . Hypertension   . Peripheral artery disease (HCC)   . Type 2 diabetes mellitus (HCC)     Past Surgical History:  Procedure Laterality Date  . APPENDECTOMY    . CARDIAC CATHETERIZATION N/A 01/03/2015   Procedure: Left Heart Cath;  Surgeon: Antonieta Ibaimothy J Gollan, MD;  Location: [facility];  Service: Cardiovascular;  Laterality: N/A;  . CESAREAN SECTION    . CORONARY ARTERY BYPASS GRAFT N/A 01/07/2015   Procedure: CORONARY ARTERY BYPASS GRAFTING (CABG) x4 using bilateral greater saphenous vein and left internal mammary artery.;  Surgeon:  Kerin Perna, MD;  Location: [facility] OR;  Service: Open Heart Surgery;  Laterality: N/A;  . PERIPHERAL VASCULAR CATHETERIZATION N/A 10/21/2015   Procedure: Abdominal Aortogram w/Lower Extremity;  Surgeon: Renford Dills, MD;  Location: [facility];  Service: Cardiovascular;  Laterality: N/A;  . PERIPHERAL VASCULAR CATHETERIZATION  10/21/2015   Procedure: Lower Extremity Intervention;  Surgeon: Renford Dills, MD;  Location: [facility];  Service: Cardiovascular;;  . PERIPHERAL VASCULAR CATHETERIZATION Left 11/11/2015   Procedure: Renal Angiography;  Surgeon:  Renford Dills, MD;  Location: [facility];  Service: Cardiovascular;  Laterality: Left;  . PERIPHERAL VASCULAR CATHETERIZATION Right 11/11/2015   Procedure: Lower Extremity Angiography;  Surgeon: Renford Dills, MD;  Location: [facility];  Service: Cardiovascular;  Laterality: Right;  . PERIPHERAL VASCULAR CATHETERIZATION  11/11/2015   Procedure: Lower Extremity Intervention;  Surgeon: Renford Dills, MD;  Location: [facility];  Service: Cardiovascular;;  . PERIPHERAL VASCULAR CATHETERIZATION  11/11/2015   Procedure: Renal Intervention;  Surgeon: Renford Dills, MD;  Location: [facility];  Service: Cardiovascular;;  . PERIPHERAL VASCULAR CATHETERIZATION Right 11/25/2015   Procedure: Lower Extremity Angiography;  Surgeon: Renford Dills, MD;  Location: [facility];  Service: Cardiovascular;  Laterality: Right;  . PERIPHERAL VASCULAR CATHETERIZATION  11/25/2015   Procedure: Lower Extremity Intervention;  Surgeon: Renford Dills, MD;  Location: [facility];  Service: Cardiovascular;;  . PERIPHERAL VASCULAR CATHETERIZATION Left 12/17/2015   Procedure: Lower Extremity Angiography;  Surgeon: Renford Dills, MD;  Location: [facility];  Service: Cardiovascular;  Laterality: Left;  . PERIPHERAL VASCULAR CATHETERIZATION  12/17/2015   Procedure: Lower Extremity Intervention;  Surgeon: Renford Dills, MD;  Location: [facility];  Service: Cardiovascular;;  . PERIPHERAL VASCULAR CATHETERIZATION Left 05/11/2016   Procedure: Lower Extremity Angiography;  Surgeon: Renford Dills, MD;  Location: [facility];  Service: Cardiovascular;  Laterality: Left;  . PERIPHERAL VASCULAR CATHETERIZATION  05/11/2016   Procedure: Lower Extremity Intervention;  Surgeon: Renford Dills, MD;  Location: [facility];  Service: Cardiovascular;;  . TONSILLECTOMY AND ADENOIDECTOMY    . TRANSLUMINAL ANGIOPLASTY  01/30/2013    L posterior tibial artery, L tibioperoneal trunk, L SFA  . TRANSLUMINAL ATHERECTOMY TIBIAL ARTERY  01/30/2013    Social History Social History  Substance Use Topics  . Smoking status: Never Smoker  . Smokeless tobacco: Never Used  . Alcohol use No    Family History Family History  Problem Relation Age of Onset  . Hypertension Mother   . Cancer Father   No family history of bleeding/clotting disorders, porphyria or autoimmune disease   Allergies  Allergen Reactions  . Metformin And Related Diarrhea  . Biaxin [Clarithromycin] Itching  . Atorvastatin Cough  . Codeine Nausea Only and Other (See Comments)    Constipates; doesn't like the way it makes her feel   . Oxycodone Hcl Other (See Comments)    01/10/15 patient  tolerated 8 doses of oxycodone with no allergy symptoms 7/12 noted allergy including, Palpitations, difficulty breathing.     REVIEW OF SYSTEMS (Negative unless checked)  Constitutional: [] Weight loss  [] Fever  [] Chills Cardiac: [] Chest pain   [] Chest pressure   [] Palpitations   [] Shortness of breath when laying flat   [] Shortness of breath with exertion. Vascular:  [] Pain in legs with walking   [] Pain in legs at rest  [] History of DVT   []   Phlebitis   [x] Swelling in legs   [] Varicose veins   [] Non-healing ulcers Pulmonary:   [] Uses home oxygen   [] Productive cough   [] Hemoptysis   [] Wheeze  [] COPD   [] Asthma Neurologic:  [] Dizziness   [] Seizures   [] History of stroke   [] History of TIA  [] Aphasia   [] Vissual changes   [] Weakness or numbness in arm   [] Weakness or numbness in leg Musculoskeletal:   [] Joint swelling   [] Joint pain   [] Low back pain Hematologic:  [] Easy bruising  [] Easy bleeding   [] Hypercoagulable state   [] Anemic Gastrointestinal:  [] Diarrhea   [] Vomiting  [] Gastroesophageal reflux/heartburn   [] Difficulty swallowing. Genitourinary:  [] Chronic kidney disease   [] Difficult urination  [] Frequent urination   [] Blood in urine Skin:  [] Rashes   [] Ulcers   Psychological:  [] History of anxiety   []  History of major depression.  Physical Examination  Vitals:   05/31/16 1648  BP: (!) 149/83  Pulse: 64  Resp: 16  Weight: 181 lb (82.1 kg)  Height: 5\' 8"  (1.727 m)   Body mass index is 27.52 kg/m. Gen: WD/WN, NAD Head: [place]/AT, No temporalis wasting.  Ear/Nose/Throat: Hearing grossly intact, nares w/o erythema or drainage, poor dentition Eyes: PER, EOMI, sclera nonicteric.  Neck: Supple, no masses.  No bruit or JVD.  Pulmonary:  Good air movement, clear to auscultation bilaterally, no use of accessory muscles.  Cardiac: RRR, normal S1, S2, no Murmurs. Vascular:   Vessel Right Left  Radial Palpable Palpable  Ulnar Palpable Palpable  Brachial Palpable Palpable  Carotid Palpable Palpable  Femoral Palpable Palpable  Popliteal Palpable Palpable  PT Not Palpable 1+ Palpable  DP Not Palpable Not Palpable   Gastrointestinal: soft, non-distended. No guarding/no peritoneal signs.  Musculoskeletal: M/S 5/5 throughout.  No deformity or atrophy.  Neurologic: CN 2-12 intact. Pain and light touch intact in extremities.  Symmetrical.  Speech is fluent. Motor exam as listed above. Psychiatric: Judgment intact, Mood & affect appropriate for pt's clinical situation. Dermatologic: No rashes or ulcers noted.  No changes consistent with cellulitis. Lymph : No Cervical lymphadenopathy, no lichenification or skin changes of chronic lymphedema.  CBC Lab Results  Component Value Date   WBC 5.8 12/20/2015   HGB 11.3 (L) 12/20/2015   HCT 33.5 (L) 12/20/2015   MCV 84.7 12/20/2015   PLT 177 12/20/2015    BMET    Component Value Date/Time   NA 138 12/20/2015 0453   NA 136 09/26/2013 0928   K 3.9 12/20/2015 0453   K 4.5 09/26/2013 0928   CL 107 12/20/2015 0453   CL 104 09/26/2013 0928   CO2 23 12/20/2015 0453   CO2 31 09/26/2013 0928   GLUCOSE 191 (H) 12/20/2015 0453   GLUCOSE 145 (H) 09/26/2013 0928   BUN 21 (H) 05/10/2016 1419   BUN 19 (H)  09/26/2013 0928   CREATININE 0.79 05/10/2016 1419   CREATININE 0.85 09/26/2013 0928   CALCIUM 8.8 (L) 12/20/2015 0453   CALCIUM 9.8 09/26/2013 0928   GFRNONAA >60 05/10/2016 1419   GFRNONAA >60 09/26/2013 0928   GFRAA >60 05/10/2016 1419   GFRAA >60 09/26/2013 0928   CrCl cannot be calculated (Patient's most recent lab result is older than the maximum 21 days allowed.).  COAG Lab Results  Component Value Date   INR 3.0 06/09/2016   INR 2.4 05/19/2016   INR 1.28 05/11/2016    Radiology No results found.  Assessment/Plan 1. Atherosclerosis of native artery of both lower extremities with intermittent claudication (  HCC)  Recommend:  The patient has evidence of atherosclerosis of the lower extremities s/p successful intervention.  The patient does not voice lifestyle limiting changes at this point in time.  Noninvasive studies do suggest improvement.  No invasive studies, angiography or surgery at this time The patient should continue walking and begin a more formal exercise program.  The patient should continue antiplatelet therapy and aggressive treatment of the lipid abnormalities  No changes in the patient's medications at this time  The patient should continue wearing graduated compression socks 10-15 mmHg strength to control the mild edema.   - VAS US ABI WITH/WO TBI; Future - VAS US LOWER EXTREMITY ARTERIAL DUPLEX; Future  2. Essential hypertension Continue antihypertensive medications no changes  3. Coronary artery disease involving coronary bypass graft of native heart without angina pectoris Continue cozaar  And isosorbide  4. Type 2 diabetes mellitus with other circulatory complication, without long-term current use of insulin (HCC) Continue Januvia    Levora DredgeGregory Schnier, MD  06/13/2016 3:33 PM

## 2016-06-28 ENCOUNTER — Other Ambulatory Visit: Payer: Self-pay | Admitting: Internal Medicine

## 2016-06-28 DIAGNOSIS — R05 Cough: Secondary | ICD-10-CM

## 2016-06-28 DIAGNOSIS — R059 Cough, unspecified: Secondary | ICD-10-CM

## 2016-06-29 ENCOUNTER — Other Ambulatory Visit: Payer: Self-pay | Admitting: Internal Medicine

## 2016-06-29 DIAGNOSIS — Z1231 Encounter for screening mammogram for malignant neoplasm of breast: Secondary | ICD-10-CM

## 2016-07-07 ENCOUNTER — Ambulatory Visit
Admission: RE | Admit: 2016-07-07 | Discharge: 2016-07-07 | Disposition: A | Discharge status: Home / Self Care | Payer: Medicare Other | Source: Ambulatory Visit | Attending: Internal Medicine | Admitting: Internal Medicine

## 2016-07-07 DIAGNOSIS — Z951 Presence of aortocoronary bypass graft: Secondary | ICD-10-CM | POA: Diagnosis not present

## 2016-07-07 DIAGNOSIS — I700 Atherosclerosis of aorta: Secondary | ICD-10-CM | POA: Diagnosis not present

## 2016-07-07 DIAGNOSIS — R05 Cough: Secondary | ICD-10-CM | POA: Insufficient documentation

## 2016-07-07 DIAGNOSIS — R059 Cough, unspecified: Secondary | ICD-10-CM

## 2016-07-21 ENCOUNTER — Ambulatory Visit (INDEPENDENT_AMBULATORY_CARE_PROVIDER_SITE_OTHER): Payer: Medicare Other

## 2016-07-21 DIAGNOSIS — I480 Paroxysmal atrial fibrillation: Secondary | ICD-10-CM

## 2016-07-21 DIAGNOSIS — I2699 Other pulmonary embolism without acute cor pulmonale: Secondary | ICD-10-CM | POA: Diagnosis not present

## 2016-07-21 DIAGNOSIS — Z5181 Encounter for therapeutic drug level monitoring: Secondary | ICD-10-CM

## 2016-07-21 DIAGNOSIS — I200 Unstable angina: Secondary | ICD-10-CM

## 2016-07-21 LAB — POCT INR: INR: 4.7

## 2016-07-28 ENCOUNTER — Ambulatory Visit (INDEPENDENT_AMBULATORY_CARE_PROVIDER_SITE_OTHER): Payer: Medicare Other

## 2016-07-28 DIAGNOSIS — I200 Unstable angina: Secondary | ICD-10-CM

## 2016-07-28 DIAGNOSIS — I2699 Other pulmonary embolism without acute cor pulmonale: Secondary | ICD-10-CM

## 2016-07-28 DIAGNOSIS — I480 Paroxysmal atrial fibrillation: Secondary | ICD-10-CM | POA: Diagnosis not present

## 2016-07-28 DIAGNOSIS — Z5181 Encounter for therapeutic drug level monitoring: Secondary | ICD-10-CM | POA: Diagnosis not present

## 2016-07-28 LAB — POCT INR: INR: 1.9

## 2016-08-05 ENCOUNTER — Ambulatory Visit: Payer: Medicare Other

## 2016-08-18 ENCOUNTER — Ambulatory Visit (INDEPENDENT_AMBULATORY_CARE_PROVIDER_SITE_OTHER): Payer: Medicare Other

## 2016-08-18 DIAGNOSIS — I2699 Other pulmonary embolism without acute cor pulmonale: Secondary | ICD-10-CM

## 2016-08-18 DIAGNOSIS — I480 Paroxysmal atrial fibrillation: Secondary | ICD-10-CM

## 2016-08-18 DIAGNOSIS — Z5181 Encounter for therapeutic drug level monitoring: Secondary | ICD-10-CM | POA: Diagnosis not present

## 2016-08-18 LAB — POCT INR: INR: 1.8

## 2016-09-02 ENCOUNTER — Ambulatory Visit (INDEPENDENT_AMBULATORY_CARE_PROVIDER_SITE_OTHER): Payer: Medicare Other

## 2016-09-02 ENCOUNTER — Ambulatory Visit (INDEPENDENT_AMBULATORY_CARE_PROVIDER_SITE_OTHER): Payer: Medicare Other | Admitting: Vascular Surgery

## 2016-09-02 ENCOUNTER — Encounter (INDEPENDENT_AMBULATORY_CARE_PROVIDER_SITE_OTHER): Payer: Self-pay | Admitting: Vascular Surgery

## 2016-09-02 VITALS — BP 155/81 | HR 65 | Resp 16 | Ht 68.5 in | Wt 181.0 lb

## 2016-09-02 DIAGNOSIS — I70213 Atherosclerosis of native arteries of extremities with intermittent claudication, bilateral legs: Secondary | ICD-10-CM

## 2016-09-02 DIAGNOSIS — E1159 Type 2 diabetes mellitus with other circulatory complications: Secondary | ICD-10-CM

## 2016-09-02 DIAGNOSIS — I480 Paroxysmal atrial fibrillation: Secondary | ICD-10-CM | POA: Diagnosis not present

## 2016-09-02 DIAGNOSIS — I25810 Atherosclerosis of coronary artery bypass graft(s) without angina pectoris: Secondary | ICD-10-CM

## 2016-09-02 DIAGNOSIS — I10 Essential (primary) hypertension: Secondary | ICD-10-CM | POA: Diagnosis not present

## 2016-09-02 LAB — VAS US LOWER EXTREMITY ARTERIAL DUPLEX
LATIBDISTDIA: 12 cm/s
LATIBDISTSYS: 102 cm/s
LEFT PERO DIST SYS: -102 cm/s
LEFT POPLITEAL DIST DYS VEL: 0 cm/s
LEFT POPLITEAL PROX DYS VEL: 2 cm/s
LEFT SFA MID VEL: 0 cm/s
LPERODISTDIA: 0 cm/s
LPOPPPSV: 88 cm/s
LPTIBDISTSYS: 34 cm/s
LSFADISTDYSV: 0 cm/s
LSFAPROXDYS: 0 cm/s
LSFDPSV: -173 cm/s
Left popliteal dist sys PSV: -126 cm/s
Left super femoral mid sys PSV: -160 cm/s
Left super femoral prox sys PSV: -138 cm/s
left post tibial dist dia: 0 cm/s

## 2016-09-03 ENCOUNTER — Ambulatory Visit
Admission: RE | Admit: 2016-09-03 | Discharge: 2016-09-03 | Disposition: A | Discharge status: Home / Self Care | Payer: Medicare Other | Source: Ambulatory Visit | Attending: Internal Medicine | Admitting: Internal Medicine

## 2016-09-03 DIAGNOSIS — Z1231 Encounter for screening mammogram for malignant neoplasm of breast: Secondary | ICD-10-CM

## 2016-09-03 NOTE — Progress Notes (Signed)
MRN : [id number]  [name] is a 66 y.o. ([birth year]) female who presents with chief complaint of  Chief Complaint  Patient presents with  . Re-evaluation    Ultrasound follow up  .  History of Present Illness: The patient returns to the office for followup and review status post angiogram with intervention most recently 05/11/2016. The patient notes improvement in the lower extremity symptoms. No interval shortening of the patient's claudication distance or rest pain symptoms. Previous wounds have now healed.  No new ulcers or wounds have occurred since the last visit.  There have been no significant changes to the patient's overall health care.  The patient denies amaurosis fugax or recent TIA symptoms. There are no recent neurological changes noted. The patient denies history of DVT, PE or superficial thrombophlebitis. The patient denies recent episodes of angina or shortness of breath.   ABI's Rt=0.93 and Lt=1.10  (previous ABI's Rt=[place] and Lt=1.09) Duplex US of the left lower extremity arterial system shows patent left leg arterial system  Current Meds  Medication Sig  . aspirin 81 MG EC tablet Take 1 tablet (81 mg total) by mouth daily.  Marland Kitchen. atenolol (TENORMIN) 100 MG tablet Take 0.5 tablets (50 mg total) by mouth daily.  . clopidogrel (PLAVIX) 75 MG tablet   . doxycycline (VIBRA-TABS) 100 MG tablet   . furosemide (LASIX) 20 MG tablet Take 1 tablet (20 mg total) by mouth daily as needed.  Marland Kitchen. guaiFENesin (MUCINEX) 600 MG 12 hr tablet Take 1 tablet (600 mg total) by mouth daily. Takes 1 tab daily, can take addt'l tab as needed for mucus  . isosorbide mononitrate (IMDUR) 30 MG 24 hr tablet Take 1 tablet (30 mg total) by mouth 2 (two) times daily.  Marland Kitchen. losartan (COZAAR) 25 MG tablet Take 0.5 tablets (12.5 mg total) by mouth at bedtime.  . norethindrone-ethinyl estradiol-iron (MICROGESTIN FE,GILDESS FE,LOESTRIN FE) 1.5-30 MG-MCG tablet Take 1 tablet by mouth daily.  . potassium  chloride (K-DUR) 10 MEQ tablet Take 1 tablet (10 mEq total) by mouth 2 (two) times daily as needed.  . rosuvastatin (CRESTOR) 40 MG tablet Take 1 tablet (40 mg total) by mouth daily.  . sitaGLIPtin (JANUVIA) 100 MG tablet Take 100 mg by mouth daily.    Marland Kitchen. warfarin (COUMADIN) 5 MG tablet Take as directed by [facility]  . zolpidem (AMBIEN) 10 MG tablet Take 10 mg by mouth at bedtime as needed.      Past Medical History:  Diagnosis Date  . Acute on chronic diastolic CHF (congestive heart failure) (HCC) 01/24/2015  . Anemia   . Anxiety   . Coronary artery disease    Status post coronary stenting approximately 10 years ago  . Depression   . Hyperlipidemia   . Hypertension   . Peripheral artery disease (HCC)   . Type 2 diabetes mellitus (HCC)     Past Surgical History:  Procedure Laterality Date  . APPENDECTOMY    . CARDIAC CATHETERIZATION N/A 01/03/2015   Procedure: Left Heart Cath;  Surgeon: Antonieta Ibaimothy J Gollan, MD;  Location: [facility];  Service: Cardiovascular;  Laterality: N/A;  . CESAREAN SECTION    . CORONARY ARTERY BYPASS GRAFT N/A 01/07/2015   Procedure: CORONARY ARTERY BYPASS GRAFTING (CABG) x4 using bilateral greater saphenous vein and left internal mammary artery.;  Surgeon: Kerin PernaPeter Van Trigt, MD;  [facility];  Service: Open Heart Surgery;  Laterality: N/A;  . PERIPHERAL VASCULAR CATHETERIZATION N/A 10/21/2015   Procedure: Abdominal  Aortogram w/Lower Extremity;  Surgeon: Renford Dills, MD;  Location: [facility];  Service: Cardiovascular;  Laterality: N/A;  . PERIPHERAL VASCULAR CATHETERIZATION  10/21/2015   Procedure: Lower Extremity Intervention;  Surgeon: Renford Dills, MD;  Location: [facility];  Service: Cardiovascular;;  . PERIPHERAL VASCULAR CATHETERIZATION Left 11/11/2015   Procedure: Renal Angiography;  Surgeon: Renford Dills, MD;  Location: [facility];  Service: Cardiovascular;  Laterality: Left;  . PERIPHERAL  VASCULAR CATHETERIZATION Right 11/11/2015   Procedure: Lower Extremity Angiography;  Surgeon: Renford Dills, MD;  Location: [facility];  Service: Cardiovascular;  Laterality: Right;  . PERIPHERAL VASCULAR CATHETERIZATION  11/11/2015   Procedure: Lower Extremity Intervention;  Surgeon: Renford Dills, MD;  Location: [facility];  Service: Cardiovascular;;  . PERIPHERAL VASCULAR CATHETERIZATION  11/11/2015   Procedure: Renal Intervention;  Surgeon: Renford Dills, MD;  Location: [facility];  Service: Cardiovascular;;  . PERIPHERAL VASCULAR CATHETERIZATION Right 11/25/2015   Procedure: Lower Extremity Angiography;  Surgeon: Renford Dills, MD;  Location: [facility];  Service: Cardiovascular;  Laterality: Right;  . PERIPHERAL VASCULAR CATHETERIZATION  11/25/2015   Procedure: Lower Extremity Intervention;  Surgeon: Renford Dills, MD;  Location: [facility];  Service: Cardiovascular;;  . PERIPHERAL VASCULAR CATHETERIZATION Left 12/17/2015   Procedure: Lower Extremity Angiography;  Surgeon: Renford Dills, MD;  Location: [facility];  Service: Cardiovascular;  Laterality: Left;  . PERIPHERAL VASCULAR CATHETERIZATION  12/17/2015   Procedure: Lower Extremity Intervention;  Surgeon: Renford Dills, MD;  Location: [facility];  Service: Cardiovascular;;  . PERIPHERAL VASCULAR CATHETERIZATION Left 05/11/2016   Procedure: Lower Extremity Angiography;  Surgeon: Renford Dills, MD;  Location: [facility];  Service: Cardiovascular;  Laterality: Left;  . PERIPHERAL VASCULAR CATHETERIZATION  05/11/2016   Procedure: Lower Extremity Intervention;  Surgeon: Renford Dills, MD;  Location: [facility];  Service: Cardiovascular;;  . TONSILLECTOMY AND ADENOIDECTOMY    . TRANSLUMINAL ANGIOPLASTY  01/30/2013   L posterior tibial artery, L tibioperoneal trunk, L SFA  . TRANSLUMINAL ATHERECTOMY TIBIAL ARTERY  01/30/2013     Social History Social History  Substance Use Topics  . Smoking status: Never Smoker  . Smokeless tobacco: Never Used  . Alcohol use No    Family History Family History  Problem Relation Age of Onset  . Hypertension Mother   . Cancer Father   No family history of bleeding/clotting disorders, porphyria or autoimmune disease   Allergies  Allergen Reactions  . Metformin And Related Diarrhea  . Biaxin [Clarithromycin] Itching  . Atorvastatin Cough  . Codeine Nausea Only and Other (See Comments)    Constipates; doesn't like the way it makes her feel   . Oxycodone Hcl Other (See Comments)    01/10/15 patient  tolerated 8 doses of oxycodone with no allergy symptoms 7/12 noted allergy including, Palpitations, difficulty breathing.     REVIEW OF SYSTEMS (Negative unless checked)  Constitutional: [] Weight loss  [] Fever  [] Chills Cardiac: [] Chest pain   [] Chest pressure   [] Palpitations   [] Shortness of breath when laying flat   [] Shortness of breath with exertion. Vascular:  [x] Pain in legs with walking   [] Pain in legs at rest  [] History of DVT   [] Phlebitis   [] Swelling in legs   [] Varicose veins   [] Non-healing ulcers Pulmonary:   [] Uses home oxygen   [] Productive cough   [] Hemoptysis   []   Wheeze  [] COPD   [] Asthma Neurologic:  [] Dizziness   [] Seizures   [] History of stroke   [] History of TIA  [] Aphasia   [] Vissual changes   [] Weakness or numbness in arm   [] Weakness or numbness in leg Musculoskeletal:   [] Joint swelling   [x] Joint pain   [] Low back pain Hematologic:  [] Easy bruising  [] Easy bleeding   [] Hypercoagulable state   [] Anemic Gastrointestinal:  [] Diarrhea   [] Vomiting  [] Gastroesophageal reflux/heartburn   [] Difficulty swallowing. Genitourinary:  [] Chronic kidney disease   [] Difficult urination  [] Frequent urination   [] Blood in urine Skin:  [] Rashes   [] Ulcers  Psychological:  [] History of anxiety   []  History of major depression.  Physical Examination  Vitals:    09/02/16 1529  BP: (!) 155/81  Pulse: 65  Resp: 16  Weight: 181 lb (82.1 kg)  Height: 5' 8.5" (1.74 m)   Body mass index is 27.12 kg/m. Gen: WD/WN, NAD Head: [place]/AT, No temporalis wasting.  Ear/Nose/Throat: Hearing grossly intact, nares w/o erythema or drainage, poor dentition Eyes: PER, EOMI, sclera nonicteric.  Neck: Supple, no masses.  No bruit or JVD.  Pulmonary:  Good air movement, clear to auscultation bilaterally, no use of accessory muscles.  Cardiac: RRR, normal S1, S2, no Murmurs. Vascular: feet pink and warm with brisk cap refill Vessel Right Left  Radial Palpable Palpable  Ulnar Palpable Palpable  Brachial Palpable Palpable  Carotid Palpable Palpable  Femoral Palpable Palpable  Popliteal Palpable Palpable  PT Trace Palpable 1+ Palpable  DP Not Palpable Not Palpable   Gastrointestinal: soft, non-distended. No guarding/no peritoneal signs.  Musculoskeletal: M/S 5/5 throughout.  No deformity or atrophy.  Neurologic: CN 2-12 intact. Pain and light touch intact in extremities.  Symmetrical.  Speech is fluent. Motor exam as listed above. Psychiatric: Judgment intact, Mood & affect appropriate for pt's clinical situation. Dermatologic: No rashes or ulcers noted.  No changes consistent with cellulitis. Lymph : No Cervical lymphadenopathy, no lichenification or skin changes of chronic lymphedema.  CBC Lab Results  Component Value Date   WBC 5.8 12/20/2015   HGB 11.3 (L) 12/20/2015   HCT 33.5 (L) 12/20/2015   MCV 84.7 12/20/2015   PLT 177 12/20/2015    BMET    Component Value Date/Time   NA 138 12/20/2015 0453   NA 136 09/26/2013 0928   K 3.9 12/20/2015 0453   K 4.5 09/26/2013 0928   CL 107 12/20/2015 0453   CL 104 09/26/2013 0928   CO2 23 12/20/2015 0453   CO2 31 09/26/2013 0928   GLUCOSE 191 (H) 12/20/2015 0453   GLUCOSE 145 (H) 09/26/2013 0928   BUN 21 (H) 05/10/2016 1419   BUN 19 (H) 09/26/2013 0928   CREATININE 0.79 05/10/2016 1419   CREATININE 0.85  09/26/2013 0928   CALCIUM 8.8 (L) 12/20/2015 0453   CALCIUM 9.8 09/26/2013 0928   GFRNONAA >60 05/10/2016 1419   GFRNONAA >60 09/26/2013 0928   GFRAA >60 05/10/2016 1419   GFRAA >60 09/26/2013 0928   CrCl cannot be calculated (Patient's most recent lab result is older than the maximum 21 days allowed.).  COAG Lab Results  Component Value Date   INR 1.8 08/18/2016   INR 1.9 07/28/2016   INR 4.7 07/21/2016    Radiology No results found.  Assessment/Plan 1. Atherosclerosis of native artery of both lower extremities with intermittent claudication (HCC) Recommend:  The patient is status post successful angiogram with intervention.  The patient reports that the claudication symptoms and leg pain  is essentially gone.   The patient denies lifestyle limiting changes at this point in time.  No further invasive studies, angiography or surgery at this time The patient should continue walking and begin a more formal exercise program.  The patient should continue antiplatelet therapy and aggressive treatment of the lipid abnormalities  The patient should continue wearing graduated compression socks 10-15 mmHg strength to control the mild edema.  Patient should undergo noninvasive studies as ordered. The patient will follow up with me after the studies.   - VAS [place] LOWER EXTREMITY ARTERIAL DUPLEX; Future - VAS [place] ABI WITH/WO TBI; Future  2. Coronary artery disease involving coronary bypass graft of native heart without angina pectoris Continue cardiac and antihypertensive medications as already ordered and reviewed, no changes at this time.  Continue statin as ordered and reviewed, no changes at this time  Nitrates PRN for chest pain  3. PAF (paroxysmal atrial fibrillation) (HCC) Continue antiarrhythmic medications as already ordered, these medications have been reviewed and there are no changes at this time.   4. Essential hypertension Continue antihypertensive medications as  already ordered, these medications have been reviewed and there are no changes at this time.   5. Type 2 diabetes mellitus with other circulatory complication, without long-term current use of insulin (HCC) Continue hypoglycemic medications as already ordered, these medications have been reviewed and there are no changes at this time.  Hgb A1C to be monitored as already arranged by primary service  [name], MD  09/03/2016 7:49 AM

## 2016-09-08 ENCOUNTER — Ambulatory Visit (INDEPENDENT_AMBULATORY_CARE_PROVIDER_SITE_OTHER): Payer: Medicare Other

## 2016-09-08 DIAGNOSIS — I70213 Atherosclerosis of native arteries of extremities with intermittent claudication, bilateral legs: Secondary | ICD-10-CM

## 2016-09-08 DIAGNOSIS — I2699 Other pulmonary embolism without acute cor pulmonale: Secondary | ICD-10-CM

## 2016-09-08 DIAGNOSIS — I480 Paroxysmal atrial fibrillation: Secondary | ICD-10-CM

## 2016-09-08 DIAGNOSIS — Z5181 Encounter for therapeutic drug level monitoring: Secondary | ICD-10-CM

## 2016-09-08 LAB — POCT INR: INR: 3.2

## 2016-09-22 ENCOUNTER — Ambulatory Visit (INDEPENDENT_AMBULATORY_CARE_PROVIDER_SITE_OTHER): Payer: Medicare Other

## 2016-09-22 DIAGNOSIS — I2699 Other pulmonary embolism without acute cor pulmonale: Secondary | ICD-10-CM | POA: Diagnosis not present

## 2016-09-22 DIAGNOSIS — I480 Paroxysmal atrial fibrillation: Secondary | ICD-10-CM

## 2016-09-22 DIAGNOSIS — Z5181 Encounter for therapeutic drug level monitoring: Secondary | ICD-10-CM | POA: Diagnosis not present

## 2016-09-22 DIAGNOSIS — I70213 Atherosclerosis of native arteries of extremities with intermittent claudication, bilateral legs: Secondary | ICD-10-CM

## 2016-09-22 LAB — POCT INR: INR: 2

## 2016-10-13 ENCOUNTER — Ambulatory Visit (INDEPENDENT_AMBULATORY_CARE_PROVIDER_SITE_OTHER): Payer: Medicare Other

## 2016-10-13 DIAGNOSIS — Z5181 Encounter for therapeutic drug level monitoring: Secondary | ICD-10-CM | POA: Diagnosis not present

## 2016-10-13 DIAGNOSIS — I2699 Other pulmonary embolism without acute cor pulmonale: Secondary | ICD-10-CM

## 2016-10-13 DIAGNOSIS — I70213 Atherosclerosis of native arteries of extremities with intermittent claudication, bilateral legs: Secondary | ICD-10-CM

## 2016-10-13 DIAGNOSIS — I480 Paroxysmal atrial fibrillation: Secondary | ICD-10-CM | POA: Diagnosis not present

## 2016-10-13 LAB — POCT INR: INR: 1
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
## 2016-10-20 ENCOUNTER — Encounter: Payer: Self-pay | Admitting: Cardiovascular Disease

## 2016-10-20 ENCOUNTER — Ambulatory Visit (INDEPENDENT_AMBULATORY_CARE_PROVIDER_SITE_OTHER): Payer: Medicare Other

## 2016-10-20 ENCOUNTER — Ambulatory Visit (INDEPENDENT_AMBULATORY_CARE_PROVIDER_SITE_OTHER): Payer: Medicare Other | Admitting: Cardiovascular Disease

## 2016-10-20 VITALS — BP 160/82 | HR 63 | Ht 68.5 in | Wt 180.0 lb

## 2016-10-20 DIAGNOSIS — E782 Mixed hyperlipidemia: Secondary | ICD-10-CM

## 2016-10-20 DIAGNOSIS — E1159 Type 2 diabetes mellitus with other circulatory complications: Secondary | ICD-10-CM

## 2016-10-20 DIAGNOSIS — I2699 Other pulmonary embolism without acute cor pulmonale: Secondary | ICD-10-CM | POA: Diagnosis not present

## 2016-10-20 DIAGNOSIS — I480 Paroxysmal atrial fibrillation: Secondary | ICD-10-CM

## 2016-10-20 DIAGNOSIS — Z5181 Encounter for therapeutic drug level monitoring: Secondary | ICD-10-CM

## 2016-10-20 DIAGNOSIS — I25810 Atherosclerosis of coronary artery bypass graft(s) without angina pectoris: Secondary | ICD-10-CM | POA: Diagnosis not present

## 2016-10-20 DIAGNOSIS — Z951 Presence of aortocoronary bypass graft: Secondary | ICD-10-CM

## 2016-10-20 DIAGNOSIS — I209 Angina pectoris, unspecified: Secondary | ICD-10-CM

## 2016-10-20 DIAGNOSIS — I70213 Atherosclerosis of native arteries of extremities with intermittent claudication, bilateral legs: Secondary | ICD-10-CM | POA: Diagnosis not present

## 2016-10-20 DIAGNOSIS — I5033 Acute on chronic diastolic (congestive) heart failure: Secondary | ICD-10-CM

## 2016-10-20 DIAGNOSIS — I739 Peripheral vascular disease, unspecified: Secondary | ICD-10-CM | POA: Diagnosis not present

## 2016-10-20 DIAGNOSIS — I10 Essential (primary) hypertension: Secondary | ICD-10-CM | POA: Diagnosis not present

## 2016-10-20 LAB — POCT INR: INR: 1.9

## 2016-10-20 NOTE — Patient Instructions (Addendum)
Medication Instructions:   For cramping, try the CoEnz Q10 Try magnesium BID  If no relief, Stop the crestor  Monitor BP Goal <140 on the top If running high,  Call the office    Labwork:  No new labs needed  Testing/Procedures:  No further testing at this time   I recommend watching educational videos on topics of interest to you at:       www.goemmi.com  Enter code: [facility]    Follow-Up: It was a pleasure seeing you in the office today. Please call us if you have new issues that need to be addressed before your next appt.  [phone]  Your physician wants you to follow-up in: 6 months.  You will receive a reminder letter in the mail two months in advance. If you don't receive a letter, please call our office to schedule the follow-up appointment.  If you need a refill on your cardiac medications before your next appointment, please call your pharmacy.

## 2016-11-09 ENCOUNTER — Other Ambulatory Visit: Payer: Self-pay | Admitting: *Deleted

## 2016-11-09 MED ORDER — WARFARIN SODIUM 5 MG PO TABS: ORAL_TABLET | ORAL | 3 refills | Status: DC

## 2016-11-09 NOTE — Telephone Encounter (Signed)
Refill done as requested 

## 2016-11-10 MED ORDER — ROSUVASTATIN CALCIUM 40 MG PO TABS: 40.0000 mg | ORAL_TABLET | Freq: Every day | ORAL | 2 refills | Status: DC

## 2016-11-17 ENCOUNTER — Ambulatory Visit (INDEPENDENT_AMBULATORY_CARE_PROVIDER_SITE_OTHER): Payer: Medicare Other

## 2016-11-17 DIAGNOSIS — Z5181 Encounter for therapeutic drug level monitoring: Secondary | ICD-10-CM

## 2016-11-17 DIAGNOSIS — I70213 Atherosclerosis of native arteries of extremities with intermittent claudication, bilateral legs: Secondary | ICD-10-CM

## 2016-11-17 DIAGNOSIS — I2699 Other pulmonary embolism without acute cor pulmonale: Secondary | ICD-10-CM | POA: Diagnosis not present

## 2016-11-17 DIAGNOSIS — I480 Paroxysmal atrial fibrillation: Secondary | ICD-10-CM | POA: Diagnosis not present

## 2016-11-17 LAB — POCT INR: INR: 4.8

## 2016-11-22 ENCOUNTER — Telehealth: Payer: Self-pay | Admitting: Cardiovascular Disease

## 2016-11-22 NOTE — Telephone Encounter (Addendum)
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

## 2016-11-22 NOTE — Telephone Encounter (Signed)
Patient had a callus removed on foot and it was not carterized.  Patient is having some bleeding. Saturated bandage  x1    Please call to discuss possible dosage change.
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
## 2016-11-23 ENCOUNTER — Ambulatory Visit (INDEPENDENT_AMBULATORY_CARE_PROVIDER_SITE_OTHER): Payer: Medicare Other | Admitting: *Deleted

## 2016-11-23 DIAGNOSIS — I70213 Atherosclerosis of native arteries of extremities with intermittent claudication, bilateral legs: Secondary | ICD-10-CM

## 2016-11-23 DIAGNOSIS — I480 Paroxysmal atrial fibrillation: Secondary | ICD-10-CM

## 2016-11-23 DIAGNOSIS — I2699 Other pulmonary embolism without acute cor pulmonale: Secondary | ICD-10-CM

## 2016-11-23 DIAGNOSIS — Z5181 Encounter for therapeutic drug level monitoring: Secondary | ICD-10-CM | POA: Diagnosis not present

## 2016-11-23 LAB — POCT INR: INR: 4

## 2016-11-23 NOTE — Progress Notes (Signed)
Spoke with [employer], Pharm D. She advised for patient to skip tomorrow's dose and then to take 1.5 tablets daily, then recheck on 12/01/16 in Coumadin Clinic.

## 2016-12-01 ENCOUNTER — Ambulatory Visit (INDEPENDENT_AMBULATORY_CARE_PROVIDER_SITE_OTHER): Payer: Medicare Other

## 2016-12-01 ENCOUNTER — Encounter (INDEPENDENT_AMBULATORY_CARE_PROVIDER_SITE_OTHER): Payer: Self-pay | Admitting: Vascular Surgery

## 2016-12-01 ENCOUNTER — Ambulatory Visit (INDEPENDENT_AMBULATORY_CARE_PROVIDER_SITE_OTHER): Payer: Medicare Other | Admitting: Vascular Surgery

## 2016-12-01 VITALS — BP 159/75 | HR 63 | Resp 17 | Wt 180.0 lb

## 2016-12-01 DIAGNOSIS — I70213 Atherosclerosis of native arteries of extremities with intermittent claudication, bilateral legs: Secondary | ICD-10-CM | POA: Diagnosis not present

## 2016-12-01 DIAGNOSIS — E1159 Type 2 diabetes mellitus with other circulatory complications: Secondary | ICD-10-CM | POA: Diagnosis not present

## 2016-12-01 DIAGNOSIS — I2699 Other pulmonary embolism without acute cor pulmonale: Secondary | ICD-10-CM

## 2016-12-01 DIAGNOSIS — Z5181 Encounter for therapeutic drug level monitoring: Secondary | ICD-10-CM

## 2016-12-01 DIAGNOSIS — E782 Mixed hyperlipidemia: Secondary | ICD-10-CM | POA: Diagnosis not present

## 2016-12-01 DIAGNOSIS — I480 Paroxysmal atrial fibrillation: Secondary | ICD-10-CM | POA: Diagnosis not present

## 2016-12-01 LAB — VAS US LOWER EXTREMITY ARTERIAL DUPLEX
LPERODISTSYS: 59 cm/s
LPOPDPSV: -64 cm/s
LPOPPPSV: 123 cm/s
LSFDPSV: -124 cm/s
Left super femoral mid sys PSV: -162 cm/s
Left super femoral prox sys PSV: -106 cm/s

## 2016-12-01 LAB — POCT INR: INR: 2.3

## 2016-12-02 ENCOUNTER — Telehealth (INDEPENDENT_AMBULATORY_CARE_PROVIDER_SITE_OTHER): Payer: Self-pay

## 2016-12-02 NOTE — Telephone Encounter (Signed)
Attempted to contact patient regarding her angio and was unable to leave a message due to her mailbox being full. I have mailed out her information.

## 2016-12-03 ENCOUNTER — Encounter (INDEPENDENT_AMBULATORY_CARE_PROVIDER_SITE_OTHER): Payer: Self-pay

## 2016-12-06 ENCOUNTER — Other Ambulatory Visit (INDEPENDENT_AMBULATORY_CARE_PROVIDER_SITE_OTHER): Payer: Self-pay | Admitting: Vascular Surgery

## 2016-12-06 ENCOUNTER — Telehealth: Payer: Self-pay | Admitting: Cardiovascular Disease

## 2016-12-06 NOTE — Telephone Encounter (Signed)
Returned call to pt to clarify what procedure the pt is having. States she is having an arteriogram through her left femoral artery for 3 blockages. She has previously held her Coumadin for 3 days for similar procedures.  Called Dr [name] with [facility] [phone]. Spoke with Karel Jarvis who stated they will fax over a clearance request.  Of note, pt takes Coumadin for afib with CHADS2 score of 3 (HTN, HF, DM) and CHADS2VASC score of 5 (HTN, HF, DM, female sex and CAD). She has had 1 PE in the past - 2 years ago, this was provoked after surgery. She has not required Lovenox in the past when she has held her Coumadin. Will await clearance request.

## 2016-12-06 NOTE — Telephone Encounter (Signed)
Spoke with team lead at [facility] who states they already provided pt with instructions to hold Coumadin for 3 days and that they did not need anything from [place]. Advised them that our office needs to clear the pt to ensure that it's safe for her to hold her Coumadin given history of afib and PE, and we are unable to do this if they to not alert [place] regarding upcoming procedures and only provide the patient with a hand out (which she does not recall receiving since she asked [place] how long she needs to hold her Coumadin). Then the team lead stated that for their patients on a blood thinner for a cardiac indication, they do not tell the patient to stop their blood thinner and that they are ok if pt does this. She contradicted this statement directly prior to this when she stated the patient was provided with a "general sheet" to hold her Coumadin for 3 days.  Advised office that in the future, we need to receive clearance requests for patients to ensure patient safety with regards to holding anticoagulation. Team lead continued to seem very confused by this. Called pt and advised her that any time this office advises her she may need to hold her blood thinner to contact us before she stops her anticoagulation for her safety. Pt verbalized understanding and also that she never received the paperwork that [facility] stated they sent.

## 2016-12-06 NOTE — Telephone Encounter (Signed)
Pt is having a procedure by Dr. [name] on 5/8 does she need to stop her coumadin 3 days before . Please call and advise

## 2016-12-10 ENCOUNTER — Telehealth: Payer: Self-pay | Admitting: Cardiovascular Disease

## 2016-12-10 MED ORDER — WARFARIN SODIUM 5 MG PO TABS: ORAL_TABLET | ORAL | 3 refills | Status: DC

## 2016-12-10 NOTE — Telephone Encounter (Signed)
°*  STAT* If patient is at the pharmacy, call can be transferred to refill team.   1. Which medications need to be refilled? (please list name of each medication and dose if known)  Coumadin  2. Which pharmacy/location (including street and city if local pharmacy) is medication to be sent to? [facility] [address]   3. Do they need a 30 day or 90 day supply? 50

## 2016-12-10 NOTE — Telephone Encounter (Signed)
Rx sent to [facility] 

## 2016-12-13 ENCOUNTER — Other Ambulatory Visit: Payer: Medicare Other

## 2016-12-13 ENCOUNTER — Encounter
Admission: RE | Admit: 2016-12-13 | Discharge: 2016-12-13 | Disposition: A | Discharge status: Home / Self Care | Payer: Medicare Other | Source: Ambulatory Visit | Attending: Vascular Surgery | Admitting: Vascular Surgery

## 2016-12-13 DIAGNOSIS — I5032 Chronic diastolic (congestive) heart failure: Secondary | ICD-10-CM | POA: Diagnosis not present

## 2016-12-13 DIAGNOSIS — Z8249 Family history of ischemic heart disease and other diseases of the circulatory system: Secondary | ICD-10-CM | POA: Diagnosis not present

## 2016-12-13 DIAGNOSIS — Z885 Allergy status to narcotic agent status: Secondary | ICD-10-CM | POA: Diagnosis not present

## 2016-12-13 DIAGNOSIS — E785 Hyperlipidemia, unspecified: Secondary | ICD-10-CM | POA: Diagnosis not present

## 2016-12-13 DIAGNOSIS — I110 Hypertensive heart disease with heart failure: Secondary | ICD-10-CM | POA: Diagnosis not present

## 2016-12-13 DIAGNOSIS — Z809 Family history of malignant neoplasm, unspecified: Secondary | ICD-10-CM | POA: Diagnosis not present

## 2016-12-13 DIAGNOSIS — Z955 Presence of coronary angioplasty implant and graft: Secondary | ICD-10-CM | POA: Diagnosis not present

## 2016-12-13 DIAGNOSIS — Z951 Presence of aortocoronary bypass graft: Secondary | ICD-10-CM | POA: Diagnosis not present

## 2016-12-13 DIAGNOSIS — Z9889 Other specified postprocedural states: Secondary | ICD-10-CM | POA: Diagnosis not present

## 2016-12-13 DIAGNOSIS — D649 Anemia, unspecified: Secondary | ICD-10-CM | POA: Diagnosis not present

## 2016-12-13 DIAGNOSIS — Z7982 Long term (current) use of aspirin: Secondary | ICD-10-CM | POA: Diagnosis not present

## 2016-12-13 DIAGNOSIS — E119 Type 2 diabetes mellitus without complications: Secondary | ICD-10-CM | POA: Diagnosis not present

## 2016-12-13 DIAGNOSIS — Z888 Allergy status to other drugs, medicaments and biological substances status: Secondary | ICD-10-CM | POA: Diagnosis not present

## 2016-12-13 DIAGNOSIS — I70222 Atherosclerosis of native arteries of extremities with rest pain, left leg: Secondary | ICD-10-CM | POA: Diagnosis present

## 2016-12-13 DIAGNOSIS — Z881 Allergy status to other antibiotic agents status: Secondary | ICD-10-CM | POA: Diagnosis not present

## 2016-12-13 DIAGNOSIS — Z86711 Personal history of pulmonary embolism: Secondary | ICD-10-CM | POA: Diagnosis not present

## 2016-12-13 HISTORY — DX: Other pulmonary embolism without acute cor pulmonale: I26.99

## 2016-12-13 LAB — BUN: BUN: 21 mg/dL — ABNORMAL HIGH (ref 6–20)

## 2016-12-13 LAB — CREATININE, SERUM: CREATININE: 0.89 mg/dL (ref 0.44–1.00)

## 2016-12-13 NOTE — Progress Notes (Signed)
Subjective:    Patient ID: [name], female    DOB: [birth year], 66 y.o.   MRN: [id number] Chief Complaint  Patient presents with  . Follow-up   Patient presents for a three month PAD follow up. The patient has been experiencing increasing left lower extremity "numbness". Patient recently stepped on a nail (left foot) however states her foot is healing. The patient underwent an ABI which showed Right ABI: [place] and Left , abnormal toe-brachial indices. A left lower extremity arterial duplex is notable for occlusion of the left distal PTA and ATA (evidence of progression of left PTA and ATA disease). No rest pain or new ulceration to the LLE. Denies fever, nausea or vomiting.     Review of Systems  Constitutional: Negative.   HENT: Negative.   Eyes: Negative.   Respiratory: Negative.   Cardiovascular: Negative.   Gastrointestinal: Negative.   Genitourinary: Negative.   Musculoskeletal: Negative.   Skin: Negative.   Allergic/Immunologic: Negative.   Neurological:       Left Lower Extremity Numbness  Hematological: Negative.   Psychiatric/Behavioral: Negative.       Objective:   Physical Exam  Constitutional: She is oriented to person, place, and time. She appears well-developed and well-nourished. No distress.  HENT:  Head: Normocephalic and atraumatic.  Eyes: Conjunctivae are normal. Pupils are equal, round, and reactive to light.  Neck: Normal range of motion.  Cardiovascular: Normal rate, regular rhythm, normal heart sounds and intact distal pulses.   Pulses:      Radial pulses are 2+ on the right side, and 2+ on the left side.       Dorsalis pedis pulses are 0 on the right side, and 0 on the left side.       Posterior tibial pulses are 0 on the right side, and 0 on the left side.  Pulmonary/Chest: Effort normal.  Musculoskeletal: Normal range of motion. She exhibits no edema.  Neurological: She is alert and oriented to person, place, and time.  Skin: Skin is warm  and dry. She is not diaphoretic.  Psychiatric: She has a normal mood and affect. Her behavior is normal. Judgment and thought content normal.  Vitals reviewed.  BP (!) 159/75   Pulse 63   Resp 17   Wt 180 lb (81.6 kg)   BMI 26.97 kg/m   Past Medical History:  Diagnosis Date  . Acute on chronic diastolic CHF (congestive heart failure) (HCC) 01/24/2015  . Anemia   . Anxiety   . Coronary artery disease    Status post coronary stenting approximately 10 years ago  . Depression   . Hyperlipidemia   . Hypertension   . Peripheral artery disease (HCC)   . Pulmonary embolism (HCC)   . Type 2 diabetes mellitus (HCC)    Social History   Social History  . Marital status: Married    Spouse name: N/A  . Number of children: N/A  . Years of education: N/A   Occupational History  . Not on file.   Social History Main Topics  . Smoking status: Never Smoker  . Smokeless tobacco: Never Used  . Alcohol use No  . Drug use: No  . Sexual activity: Not on file   Other Topics Concern  . Not on file   Social History Narrative  . No narrative on file   Past Surgical History:  Procedure Laterality Date  . APPENDECTOMY    . CARDIAC CATHETERIZATION N/A 01/03/2015   Procedure: Left Heart  Cath;  Surgeon: Antonieta Iba, MD;  Location: [facility] INVASIVE CV LAB;  Service: Cardiovascular;  Laterality: N/A;  . CESAREAN SECTION    . CORONARY ARTERY BYPASS GRAFT N/A 01/07/2015   Procedure: CORONARY ARTERY BYPASS GRAFTING (CABG) x4 using bilateral greater saphenous vein and left internal mammary artery.;  Surgeon: Kerin Perna, MD;  Location: [facility] OR;  Service: Open Heart Surgery;  Laterality: N/A;  . PERIPHERAL VASCULAR CATHETERIZATION N/A 10/21/2015   Procedure: Abdominal Aortogram w/Lower Extremity;  Surgeon: Renford Dills, MD;  Location: [facility] INVASIVE CV LAB;  Service: Cardiovascular;  Laterality: N/A;  . PERIPHERAL VASCULAR CATHETERIZATION  10/21/2015   Procedure: Lower Extremity Intervention;   Surgeon: Renford Dills, MD;  Location: [facility] INVASIVE CV LAB;  Service: Cardiovascular;;  . PERIPHERAL VASCULAR CATHETERIZATION Left 11/11/2015   Procedure: Renal Angiography;  Surgeon: Renford Dills, MD;  Location: [facility] INVASIVE CV LAB;  Service: Cardiovascular;  Laterality: Left;  . PERIPHERAL VASCULAR CATHETERIZATION Right 11/11/2015   Procedure: Lower Extremity Angiography;  Surgeon: Renford Dills, MD;  Location: [facility] INVASIVE CV LAB;  Service: Cardiovascular;  Laterality: Right;  . PERIPHERAL VASCULAR CATHETERIZATION  11/11/2015   Procedure: Lower Extremity Intervention;  Surgeon: Renford Dills, MD;  Location: [facility] INVASIVE CV LAB;  Service: Cardiovascular;;  . PERIPHERAL VASCULAR CATHETERIZATION  11/11/2015   Procedure: Renal Intervention;  Surgeon: Renford Dills, MD;  Location: [facility] INVASIVE CV LAB;  Service: Cardiovascular;;  . PERIPHERAL VASCULAR CATHETERIZATION Right 11/25/2015   Procedure: Lower Extremity Angiography;  Surgeon: Renford Dills, MD;  Location: [facility] INVASIVE CV LAB;  Service: Cardiovascular;  Laterality: Right;  . PERIPHERAL VASCULAR CATHETERIZATION  11/25/2015   Procedure: Lower Extremity Intervention;  Surgeon: Renford Dills, MD;  Location: [facility] INVASIVE CV LAB;  Service: Cardiovascular;;  . PERIPHERAL VASCULAR CATHETERIZATION Left 12/17/2015   Procedure: Lower Extremity Angiography;  Surgeon: Renford Dills, MD;  Location: [facility] INVASIVE CV LAB;  Service: Cardiovascular;  Laterality: Left;  . PERIPHERAL VASCULAR CATHETERIZATION  12/17/2015   Procedure: Lower Extremity Intervention;  Surgeon: Renford Dills, MD;  Location: [facility] INVASIVE CV LAB;  Service: Cardiovascular;;  . PERIPHERAL VASCULAR CATHETERIZATION Left 05/11/2016   Procedure: Lower Extremity Angiography;  Surgeon: Renford Dills, MD;  Location: [facility] INVASIVE CV LAB;  Service: Cardiovascular;  Laterality: Left;  . PERIPHERAL VASCULAR CATHETERIZATION  05/11/2016   Procedure: Lower Extremity  Intervention;  Surgeon: Renford Dills, MD;  Location: [facility] INVASIVE CV LAB;  Service: Cardiovascular;;  . TONSILLECTOMY AND ADENOIDECTOMY    . TRANSLUMINAL ANGIOPLASTY  01/30/2013   L posterior tibial artery, L tibioperoneal trunk, L SFA  . TRANSLUMINAL ATHERECTOMY TIBIAL ARTERY  01/30/2013   Family History  Problem Relation Age of Onset  . Hypertension Mother   . Cancer Father    Allergies  Allergen Reactions  . Metformin And Related Diarrhea  . Biaxin [Clarithromycin] Itching  . Atorvastatin Cough  . Codeine Nausea Only and Other (See Comments)    Constipates; doesn't like the way it makes her feel   . Oxycodone Hcl Other (See Comments)    01/10/15 patient  tolerated 8 doses of oxycodone with no allergy symptoms 7/12 noted allergy including, Palpitations, difficulty breathing.      Assessment & Plan:  Patient presents for a three month PAD follow up. The patient has been experiencing increasing left lower extremity "numbness". Patient recently stepped on a nail (left foot) however states her foot is healing. The patient underwent an ABI which showed Right ABI:  [place] and Left [place], abnormal toe-brachial indices. A left lower extremity arterial duplex is notable for occlusion of the left distal PTA and ATA (evidence of progression of left PTA and ATA disease). No rest pain or new ulceration to the LLE. Denies fever, nausea or vomiting.   1. Atherosclerosis of native artery of both lower extremities with intermittent claudication (HCC) - Worsening Patient with strong hx of PAD requiring intervention for ulcer formation. Increasing numbness in LLE with advancing disease on duplex recommend LLE angiogram with intervention in an attempt to revascularize the extremity. Procedure, risks and benefits explained to patient. All questions answered. Patient wishes to proceed.   2. Mixed hyperlipidemia - Stable Encouraged good control as its slows the progression of atherosclerotic disease  3.  Type 2 diabetes mellitus with other circulatory complication, without long-term current use of insulin (HCC) - Stable Encouraged good control as its slows the progression of atherosclerotic disease  Current Outpatient Prescriptions on File Prior to Visit  Medication Sig Dispense Refill  . aspirin 81 MG EC tablet Take 1 tablet (81 mg total) by mouth daily. 30 tablet 12  . atenolol (TENORMIN) 100 MG tablet Take 0.5 tablets (50 mg total) by mouth daily. (Patient taking differently: Take 100 mg by mouth daily. ) 30 tablet 5  . furosemide (LASIX) 20 MG tablet Take 1 tablet (20 mg total) by mouth daily as needed. 30 tablet 0  . guaiFENesin (MUCINEX) 600 MG 12 hr tablet Take 1 tablet (600 mg total) by mouth daily. Takes 1 tab daily, can take addt'l tab as needed for mucus    . isosorbide mononitrate (IMDUR) 30 MG 24 hr tablet Take 1 tablet (30 mg total) by mouth 2 (two) times daily.    Marland Kitchen. losartan (COZAAR) 25 MG tablet Take 0.5 tablets (12.5 mg total) by mouth at bedtime. (Patient taking differently: Take 100 mg by mouth at bedtime. ) 30 tablet 3  . norethindrone-ethinyl estradiol-iron (MICROGESTIN FE,GILDESS FE,LOESTRIN FE) 1.5-30 MG-MCG tablet Take 1 tablet by mouth daily.    . potassium chloride (K-DUR) 10 MEQ tablet Take 1 tablet (10 mEq total) by mouth 2 (two) times daily as needed. 60 tablet 3  . rosuvastatin (CRESTOR) 40 MG tablet Take 1 tablet (40 mg total) by mouth daily. 90 tablet 2  . sitaGLIPtin (JANUVIA) 100 MG tablet Take 100 mg by mouth daily.      Marland Kitchen. zolpidem (AMBIEN) 10 MG tablet Take 10 mg by mouth at bedtime as needed.      . nitroGLYCERIN (NITROSTAT) 0.4 MG SL tablet Place 1 tablet (0.4 mg total) under the tongue every 5 (five) minutes as needed for chest pain. 25 tablet 3   No current facility-administered medications on file prior to visit.    There are no Patient Instructions on file for this visit. No Follow-up on file.  KIMBERLY A STEGMAYER, PA-C

## 2016-12-14 ENCOUNTER — Encounter
Admission: RE | Disposition: A | Discharge status: Home / Self Care | Payer: Self-pay | Source: Ambulatory Visit | Attending: Vascular Surgery

## 2016-12-14 ENCOUNTER — Ambulatory Visit
Admission: RE | Admit: 2016-12-14 | Discharge: 2016-12-14 | Disposition: A | Discharge status: Home / Self Care | Payer: Medicare Other | Source: Ambulatory Visit | Attending: Vascular Surgery | Admitting: Vascular Surgery

## 2016-12-14 DIAGNOSIS — I70222 Atherosclerosis of native arteries of extremities with rest pain, left leg: Secondary | ICD-10-CM | POA: Insufficient documentation

## 2016-12-14 DIAGNOSIS — I70221 Atherosclerosis of native arteries of extremities with rest pain, right leg: Secondary | ICD-10-CM

## 2016-12-14 DIAGNOSIS — Z955 Presence of coronary angioplasty implant and graft: Secondary | ICD-10-CM | POA: Insufficient documentation

## 2016-12-14 DIAGNOSIS — I110 Hypertensive heart disease with heart failure: Secondary | ICD-10-CM | POA: Insufficient documentation

## 2016-12-14 DIAGNOSIS — Z86711 Personal history of pulmonary embolism: Secondary | ICD-10-CM | POA: Insufficient documentation

## 2016-12-14 DIAGNOSIS — Z8249 Family history of ischemic heart disease and other diseases of the circulatory system: Secondary | ICD-10-CM | POA: Insufficient documentation

## 2016-12-14 DIAGNOSIS — Z951 Presence of aortocoronary bypass graft: Secondary | ICD-10-CM | POA: Insufficient documentation

## 2016-12-14 DIAGNOSIS — E119 Type 2 diabetes mellitus without complications: Secondary | ICD-10-CM | POA: Insufficient documentation

## 2016-12-14 DIAGNOSIS — Z885 Allergy status to narcotic agent status: Secondary | ICD-10-CM | POA: Insufficient documentation

## 2016-12-14 DIAGNOSIS — Z9889 Other specified postprocedural states: Secondary | ICD-10-CM | POA: Insufficient documentation

## 2016-12-14 DIAGNOSIS — Z7982 Long term (current) use of aspirin: Secondary | ICD-10-CM | POA: Insufficient documentation

## 2016-12-14 DIAGNOSIS — I5032 Chronic diastolic (congestive) heart failure: Secondary | ICD-10-CM | POA: Insufficient documentation

## 2016-12-14 DIAGNOSIS — Z881 Allergy status to other antibiotic agents status: Secondary | ICD-10-CM | POA: Insufficient documentation

## 2016-12-14 DIAGNOSIS — D649 Anemia, unspecified: Secondary | ICD-10-CM | POA: Insufficient documentation

## 2016-12-14 DIAGNOSIS — Z809 Family history of malignant neoplasm, unspecified: Secondary | ICD-10-CM | POA: Insufficient documentation

## 2016-12-14 DIAGNOSIS — E785 Hyperlipidemia, unspecified: Secondary | ICD-10-CM | POA: Insufficient documentation

## 2016-12-14 DIAGNOSIS — Z888 Allergy status to other drugs, medicaments and biological substances status: Secondary | ICD-10-CM | POA: Insufficient documentation

## 2016-12-14 HISTORY — PX: LOWER EXTREMITY INTERVENTION: CATH118252

## 2016-12-14 HISTORY — PX: ABDOMINAL AORTOGRAM W/LOWER EXTREMITY: CATH118223

## 2016-12-14 HISTORY — PX: LOWER EXTREMITY ANGIOGRAPHY: CATH118251

## 2016-12-14 LAB — PROTIME-INR
INR: 1.39
PROTHROMBIN TIME: 17.2 s — AB (ref 11.4–15.2)

## 2016-12-14 SURGERY — LOWER EXTREMITY ANGIOGRAPHY
Anesthesia: Moderate Sedation

## 2016-12-14 MED ORDER — MIDAZOLAM HCL 2 MG/2ML IJ SOLN
INTRAMUSCULAR | Status: DC | PRN
  Administered 2016-12-14: 2 mg via INTRAVENOUS
  Administered 2016-12-14: 0.5 mg via INTRAVENOUS
  Administered 2016-12-14 (×2): 1 mg via INTRAVENOUS

## 2016-12-14 MED ORDER — CLOPIDOGREL BISULFATE 75 MG PO TABS: 75.0000 mg | ORAL_TABLET | Freq: Every day | ORAL | 0 refills | Status: AC

## 2016-12-14 MED ORDER — SODIUM CHLORIDE 0.9 % IV SOLN
INTRAVENOUS | Status: DC
  Administered 2016-12-14: 09:00:00 via INTRAVENOUS

## 2016-12-14 MED ORDER — HYDRALAZINE HCL 20 MG/ML IJ SOLN
INTRAMUSCULAR | Status: AC
  Filled 2016-12-14: qty 1

## 2016-12-14 MED ORDER — CEFAZOLIN SODIUM-DEXTROSE 1-4 GM/50ML-% IV SOLN
1.0000 g | Freq: Once | INTRAVENOUS | Status: AC
  Administered 2016-12-14: 1 g via INTRAVENOUS

## 2016-12-14 MED ORDER — MIDAZOLAM HCL 5 MG/5ML IJ SOLN
INTRAMUSCULAR | Status: AC
Start: 2016-12-14 — End: 2016-12-14
  Filled 2016-12-14: qty 5

## 2016-12-14 MED ORDER — HYDROMORPHONE HCL 1 MG/ML IJ SOLN
0.5000 mg | Freq: Once | INTRAMUSCULAR | Status: AC
  Administered 2016-12-14: 0.5 mg via INTRAVENOUS

## 2016-12-14 MED ORDER — HEPARIN (PORCINE) IN NACL 2-0.9 UNIT/ML-% IJ SOLN
INTRAMUSCULAR | Status: AC
  Filled 2016-12-14: qty 1000

## 2016-12-14 MED ORDER — FENTANYL CITRATE (PF) 100 MCG/2ML IJ SOLN
INTRAMUSCULAR | Status: AC
  Filled 2016-12-14: qty 2

## 2016-12-14 MED ORDER — HYDROMORPHONE HCL 1 MG/ML IJ SOLN
INTRAMUSCULAR | Status: AC
  Filled 2016-12-14: qty 0.5

## 2016-12-14 MED ORDER — FAMOTIDINE 20 MG PO TABS: 40.0000 mg | ORAL_TABLET | ORAL | Status: DC | PRN

## 2016-12-14 MED ORDER — NITROGLYCERIN 1 MG/10 ML FOR IR/CATH LAB
INTRA_ARTERIAL | Status: DC | PRN
  Administered 2016-12-14: 750 ug
  Administered 2016-12-14: 500 ug

## 2016-12-14 MED ORDER — METHYLPREDNISOLONE SODIUM SUCC 125 MG IJ SOLR: 125.0000 mg | INTRAMUSCULAR | Status: DC | PRN

## 2016-12-14 MED ORDER — CLOPIDOGREL BISULFATE 300 MG PO TABS
300.0000 mg | ORAL_TABLET | ORAL | Status: AC
  Administered 2016-12-14: 300 mg via ORAL

## 2016-12-14 MED ORDER — IOPAMIDOL (ISOVUE-300) INJECTION 61%
INTRAVENOUS | Status: DC | PRN
  Administered 2016-12-14: 75 mL via INTRA_ARTERIAL

## 2016-12-14 MED ORDER — NITROGLYCERIN 5 MG/ML IV SOLN
INTRAVENOUS | Status: AC
  Filled 2016-12-14: qty 10

## 2016-12-14 MED ORDER — FENTANYL CITRATE (PF) 100 MCG/2ML IJ SOLN
INTRAMUSCULAR | Status: DC | PRN
  Administered 2016-12-14 (×2): 25 ug via INTRAVENOUS
  Administered 2016-12-14: 50 ug via INTRAVENOUS
  Administered 2016-12-14 (×2): 25 ug via INTRAVENOUS

## 2016-12-14 MED ORDER — SODIUM CHLORIDE 0.9 % IJ SOLN
INTRAMUSCULAR | Status: AC
  Filled 2016-12-14: qty 50

## 2016-12-14 MED ORDER — HEPARIN SODIUM (PORCINE) 1000 UNIT/ML IJ SOLN
INTRAMUSCULAR | Status: AC
  Filled 2016-12-14: qty 1

## 2016-12-14 MED ORDER — ONDANSETRON HCL 4 MG/2ML IJ SOLN: 4.0000 mg | Freq: Four times a day (QID) | INTRAMUSCULAR | Status: DC | PRN

## 2016-12-14 MED ORDER — LIDOCAINE HCL (PF) 1 % IJ SOLN
INTRAMUSCULAR | Status: AC
Start: 2016-12-14 — End: 2016-12-14
  Filled 2016-12-14: qty 10

## 2016-12-14 MED ORDER — CLOPIDOGREL BISULFATE 75 MG PO TABS
ORAL_TABLET | ORAL | Status: AC
  Administered 2016-12-14: 300 mg via ORAL
  Filled 2016-12-14: qty 4

## 2016-12-14 MED ORDER — HYDROMORPHONE HCL 1 MG/ML IJ SOLN: 1.0000 mg | Freq: Once | INTRAMUSCULAR | Status: DC | PRN

## 2016-12-14 MED ORDER — HEPARIN SODIUM (PORCINE) 1000 UNIT/ML IJ SOLN
INTRAMUSCULAR | Status: DC | PRN
  Administered 2016-12-14 (×2): 5000 [IU] via INTRAVENOUS

## 2016-12-14 MED ORDER — HYDRALAZINE HCL 20 MG/ML IJ SOLN
INTRAMUSCULAR | Status: DC | PRN
  Administered 2016-12-14 (×2): 10 mg via INTRAVENOUS

## 2016-12-14 SURGICAL SUPPLY — 28 items
BALLN ULTRVRSE 2.5X220X150 (BALLOONS) ×4
BALLN ULTRVRSE 2X100X150 (BALLOONS) ×4
BALLOON ULTRVRSE 2.5X220X150 (BALLOONS) IMPLANT
BALLOON ULTRVRSE 2X100X150 (BALLOONS) IMPLANT
CATH CROSSER S6 154CM (CATHETERS) ×1 IMPLANT
CATH CXI SUPP ANG 2.6FR 150CM (MICROCATHETER) ×1 IMPLANT
CATH PIG 70CM (CATHETERS) ×1 IMPLANT
CATH SEEKER .035X150CM (CATHETERS) ×1 IMPLANT
CATH USHER TPER 130CM (CATHETERS) ×1 IMPLANT
DEVICE CLOSURE MYNXGRIP 6/7F (Vascular Products) ×1 IMPLANT
DEVICE PRESTO INFLATION (MISCELLANEOUS) ×1 IMPLANT
DEVICE TORQUE (MISCELLANEOUS) ×1 IMPLANT
GLIDECATH ANGLED 4FR 120CM (CATHETERS) ×1 IMPLANT
GLIDEWIRE ANGLED SS 035X260CM (WIRE) ×1 IMPLANT
KIT FLOWMATE PROCEDURAL (MISCELLANEOUS) ×1 IMPLANT
NDL ENTRY 21GA 7CM ECHOTIP (NEEDLE) IMPLANT
NEEDLE ENTRY 21GA 7CM ECHOTIP (NEEDLE) ×4 IMPLANT
PACK ANGIOGRAPHY (CUSTOM PROCEDURE TRAY) ×4 IMPLANT
SET INTRO CAPELLA COAXIAL (SET/KITS/TRAYS/PACK) ×1 IMPLANT
SHEATH BRITE TIP 5FRX11 (SHEATH) ×1 IMPLANT
SHEATH BRITE TIP 6FRX11 (SHEATH) ×1 IMPLANT
SHEATH RAABE 6FR (SHEATH) ×1 IMPLANT
SHIELD X-DRAPE GOLD 12X17 (MISCELLANEOUS) ×1 IMPLANT
SYR MEDRAD MARK V 150ML (SYRINGE) ×1 IMPLANT
TUBING CONTRAST HIGH PRESS 72 (TUBING) ×1 IMPLANT
VALVE HEMO TOUHY BORST Y (VALVE) ×1 IMPLANT
WIRE G V18X300CM (WIRE) ×1 IMPLANT
WIRE J 3MM .035X145CM (WIRE) ×1 IMPLANT

## 2016-12-14 NOTE — Discharge Instructions (Signed)
Groin Insertion Instructions-If you lose feeling or develop tingling or pain in your leg or foot after the procedure, please walk around first.  If the discomfort does not improve , contact your physician and proceed to the nearest emergency room.  Loss of feeling in your leg might mean that a blockage has formed in the artery and this can be appropriately treated.  Limit your activity for the next two days after your procedure.  Avoid stooping, bending, heavy lifting or exertion as this may put pressure on the insertion site.  Resume normal activities in 48 hours.  You may shower after 24 hours but avoid excessive warm water and do not scrub the site.  Remove clear dressing in 48 hours.  If you have had a closure device inserted, do not soak in a tub bath or a hot tub for at least one week. ° °No driving for 48 hours after discharge.  After the procedure, check the insertion site occasionally.  If any oozing occurs or there is apparent swelling, firm pressure over the site will prevent a bruise from forming.  You can not hurt anything by pressing directly on the site.  The pressure stops the bleeding by allowing a small clot to form.  If the bleeding continues after the pressure has been applied for more than 15 minutes, call 911 or go to the nearest emergency room.   ° °The x-ray dye causes you to pass a considerate amount of urine.  For this reason, you will be asked to drink plenty of liquids after the procedure to prevent dehydration.  You may resume you regular diet.  Avoid caffeine products.   ° °For pain at the site of your procedure, take non-aspirin medicines such as Tylenol. ° °Medications: A. Hold Metformin for 48 hours if applicable.  B. Continue taking all your present medications at home unless your doctor prescribes any changes. ° °Moderate Conscious Sedation, Adult, Care After °These instructions provide you with information about caring for yourself after your procedure. Your health care provider  may also give you more specific instructions. Your treatment has been planned according to current medical practices, but problems sometimes occur. Call your health care provider if you have any problems or questions after your procedure. °What can I expect after the procedure? °After your procedure, it is common: °· To feel sleepy for several hours. °· To feel clumsy and have poor balance for several hours. °· To have poor judgment for several hours. °· To vomit if you eat too soon. °Follow these instructions at home: °For at least 24 hours after the procedure:  ° °· Do not: °¨ Participate in activities where you could fall or become injured. °¨ Drive. °¨ Use heavy machinery. °¨ Drink alcohol. °¨ Take sleeping pills or medicines that cause drowsiness. °¨ Make important decisions or sign legal documents. °¨ Take care of children on your own. °· Rest. °Eating and drinking  °· Follow the diet recommended by your health care provider. °· If you vomit: °¨ Drink water, juice, or soup when you can drink without vomiting. °¨ Make sure you have little or no nausea before eating solid foods. °General instructions  °· Have a responsible adult stay with you until you are awake and alert. °· Take over-the-counter and prescription medicines only as told by your health care provider. °· If you smoke, do not smoke without supervision. °· Keep all follow-up visits as told by your health care provider. This is important. °Contact a health   care provider if: °· You keep feeling nauseous or you keep vomiting. °· You feel light-headed. °· You develop a rash. °· You have a fever. °Get help right away if: °· You have trouble breathing. °This information is not intended to replace advice given to you by your health care provider. Make sure you discuss any questions you have with your health care provider. °Document Released: 05/16/2013 Document Revised: 12/29/2015 Document Reviewed: 11/15/2015 °Elsevier Interactive Patient Education © 2017  [employer] ° °

## 2016-12-14 NOTE — H&P (Signed)
[facility] History & Physical Update  The patient was interviewed and re-examined.  The patient's previous History and Physical has been reviewed and is unchanged.  There is no change in the plan of care. We plan to proceed with the scheduled procedure.  Levora DredgeGregory Leanord Thibeau, MD  12/14/2016, 9:12 AM

## 2016-12-14 NOTE — Op Note (Signed)
[facility] Percutaneous Study/Intervention Procedural Note   Date of Surgery: 12/14/2016  Surgeon:  Katha Cabal, MD.  Pre-operative Diagnosis: Atherosclerotic occlusive disease bilateral lower extremities with rest pain of the left lower extremity   Post-operative diagnosis: Same  Procedure(s) Performed: 1. Introduction catheter into left lower extremity 3rd order catheter placement  2. Contrast injection left lower extremity for distal runoff   3. Crosser atherectomy of the posterior tibial artery 4. Percutaneous transluminal angioplasty posterior tibial artery with a 2.5 mm all traverse balloon             5.   Minx closure right common femoral arteriotomy                Anesthesia: Conscious sedation was administered under my direct supervision by the interventional radiology RN. IV Versed plus fentanyl were utilized. Continuous ECG, pulse oximetry and blood pressure was monitored throughout the entire procedure. Conscious sedation was for a total of 132 minutes.  Sheath: 6 [place] rabi right common femoral artery  Contrast: 75 cc  Fluoroscopy Time: 16.9 minutes  Indications: Sharlet Salina presents with atherosclerotic occlusive disease bilateral lower extremities. The patient has developed rest pain of the soft tissues of theleft lower extremity. This places the patient at high risk for limb loss and amputation. The risks and benefits are reviewed all questions answered patient agrees to proceed.  Procedure: [name] is a 66 y.o. y.o. female who was identified and appropriate procedural time out was performed. The patient was then placed supine on the table and prepped and draped in the usual sterile fashion.   Ultrasound was placed in the sterile sleeve and the right  groin was evaluated the right  common femoral artery was echolucent and pulsatile indicating patency.  Image was  recorded for the permanent record and under real-time visualization a microneedle was inserted into the common femoral artery microwire followed by a micro-sheath.  A J-wire was then advanced through the micro-sheath and a  5 [place] sheath was then inserted over a J-wire. J-wire was then advanced and a 5 French pigtail catheter was positioned at the level of T12. AP projection of the aorta was then obtained. Pigtail catheter was repositioned to above the bifurcation and a RAO view of the pelvis was obtained.  Subsequently a pigtail catheter with the stiff angle Glidewire was used to cross the aortic bifurcation the catheter wire were advanced down into the left distal external iliac artery. Oblique view of the femoral bifurcation was then obtained and subsequently the wire was reintroduced and the pigtail catheter negotiated into the SFA representing third order catheter placement. Distal runoff was then performed.  5000 units of heparin was then given and allowed to circulate and a 6 Fr Rabi sheath was advanced up and over the bifurcation and positioned in the common femoral artery.  Wire and catheter were then used to negotiate the SFA stenosis and the catheter was then positioned in the distal popliteal where magnified imaging of the trifurcation was performed.  The S6 Crosser catheter was then prepped on the field and a straight Micro-catheter was advanced into the cul-de-sac of the PT artery under magnified imaging. Using the Crosser catheter the occlusion of the PT artery was negotiated.  Injection of contrast confirmed intraluminal positioning.  CXI catheter and V-18 Glidewire were then advanced down into the distal tibial.  Distal runoff was then completed by hand injection through the catheter verifying intraluminal position and distal runoff.  A 2 mm x 10 cm Ultraverse balloon was then used to angioplasty the distal PT artery and then a 2.5 mm x 22 cm Ultraverse balloon was used to angioplasty the  proxim artery. Inflations were to 10-12 atm for 1 full minute. Follow-up imaging demonstrated patency of the posterior tibial artery and the peroneal artery is preserved.   After review of these images the sheath is pulled into the right external iliac oblique of the common femoral is obtained and a Star close device deployed. There no immediate Complications.  Findings: The abdominal aorta is opacified with a bolus injection contrast. Renal arteries are widely patent and the previously placed. The aorta itself has diffuse disease but no hemodynamically significant lesions. The common and external iliac arteries are widely patent bilaterally.  The left common femoral is widely patent as is the profunda femoris.  The SFA and popliteal demonstrate nonflow limiting atherosclerosis. The peroneal is patent down to  foot.  The AT occludes just after its origin. The posterior tibial is occluded shortly after its origin and reconstitutes at the ankle.  Following angioplasty posterior tibial is now widely patent in-line flow and looks quite nice with less than 5% residual stenosis. Therefore she has 2 vessel runoff to the foot via the peroneal and the posterior tibial.   Disposition: Patient was taken to the recovery room in stable condition having tolerated the procedure well.  Gregory Schnier 12/14/2016,3:05 PM 

## 2016-12-15 ENCOUNTER — Encounter: Payer: Self-pay | Admitting: Vascular Surgery

## 2016-12-22 ENCOUNTER — Ambulatory Visit (INDEPENDENT_AMBULATORY_CARE_PROVIDER_SITE_OTHER): Payer: Medicare Other | Admitting: *Deleted

## 2016-12-22 DIAGNOSIS — I2699 Other pulmonary embolism without acute cor pulmonale: Secondary | ICD-10-CM | POA: Diagnosis not present

## 2016-12-22 DIAGNOSIS — I70213 Atherosclerosis of native arteries of extremities with intermittent claudication, bilateral legs: Secondary | ICD-10-CM | POA: Diagnosis not present

## 2016-12-22 DIAGNOSIS — I480 Paroxysmal atrial fibrillation: Secondary | ICD-10-CM | POA: Diagnosis not present

## 2016-12-22 DIAGNOSIS — Z5181 Encounter for therapeutic drug level monitoring: Secondary | ICD-10-CM

## 2016-12-22 LAB — POCT INR: INR: 2.4

## 2017-01-12 ENCOUNTER — Other Ambulatory Visit (INDEPENDENT_AMBULATORY_CARE_PROVIDER_SITE_OTHER): Payer: Self-pay | Admitting: Vascular Surgery

## 2017-01-12 DIAGNOSIS — I70222 Atherosclerosis of native arteries of extremities with rest pain, left leg: Secondary | ICD-10-CM

## 2017-01-12 DIAGNOSIS — Z9862 Peripheral vascular angioplasty status: Secondary | ICD-10-CM

## 2017-01-13 ENCOUNTER — Encounter (INDEPENDENT_AMBULATORY_CARE_PROVIDER_SITE_OTHER): Payer: Self-pay | Admitting: Vascular Surgery

## 2017-01-13 ENCOUNTER — Ambulatory Visit (INDEPENDENT_AMBULATORY_CARE_PROVIDER_SITE_OTHER): Payer: Medicare Other | Admitting: Vascular Surgery

## 2017-01-13 ENCOUNTER — Ambulatory Visit (INDEPENDENT_AMBULATORY_CARE_PROVIDER_SITE_OTHER): Payer: Medicare Other

## 2017-01-13 VITALS — BP 150/77 | HR 61 | Resp 16 | Wt 179.0 lb

## 2017-01-13 DIAGNOSIS — E782 Mixed hyperlipidemia: Secondary | ICD-10-CM

## 2017-01-13 DIAGNOSIS — Z9862 Peripheral vascular angioplasty status: Secondary | ICD-10-CM | POA: Diagnosis not present

## 2017-01-13 DIAGNOSIS — I25810 Atherosclerosis of coronary artery bypass graft(s) without angina pectoris: Secondary | ICD-10-CM

## 2017-01-13 DIAGNOSIS — I10 Essential (primary) hypertension: Secondary | ICD-10-CM | POA: Diagnosis not present

## 2017-01-13 DIAGNOSIS — E1159 Type 2 diabetes mellitus with other circulatory complications: Secondary | ICD-10-CM

## 2017-01-13 DIAGNOSIS — I70222 Atherosclerosis of native arteries of extremities with rest pain, left leg: Secondary | ICD-10-CM

## 2017-01-13 DIAGNOSIS — I739 Peripheral vascular disease, unspecified: Secondary | ICD-10-CM | POA: Diagnosis not present

## 2017-01-15 NOTE — Progress Notes (Signed)
MRN : [id number]  [name] is a 66 y.o. (05-03-1951) female who presents with chief complaint of  Chief Complaint  Patient presents with  . Follow-up  .  History of Present Illness: The patient returns to the office for followup and review of the noninvasive studies. There has been a significant deterioration in the lower extremity symptoms.  The patient notes interval shortening of their claudication distance and development of mild rest pain symptoms. No new ulcers or wounds have occurred since the last visit.  There have been no significant changes to the patient's overall health care.  The patient denies amaurosis fugax or recent TIA symptoms. There are no recent neurological changes noted. The patient denies history of DVT, PE or superficial thrombophlebitis. The patient denies recent episodes of angina or shortness of breath.   ABI's Rt=0.99 and Lt=1.07 (previous ABI's Rt=1.05 and Lt=0.79)   Current Meds  Medication Sig  . aspirin 81 MG EC tablet Take 1 tablet (81 mg total) by mouth daily.  Marland Kitchen atenolol (TENORMIN) 100 MG tablet Take 0.5 tablets (50 mg total) by mouth daily. (Patient taking differently: Take 100 mg by mouth daily. )  . furosemide (LASIX) 20 MG tablet Take 1 tablet (20 mg total) by mouth daily as needed.  Marland Kitchen guaiFENesin (MUCINEX) 600 MG 12 hr tablet Take 1 tablet (600 mg total) by mouth daily. Takes 1 tab daily, can take addt'l tab as needed for mucus  . isosorbide mononitrate (IMDUR) 30 MG 24 hr tablet Take 1 tablet (30 mg total) by mouth 2 (two) times daily.  Marland Kitchen losartan (COZAAR) 25 MG tablet Take 0.5 tablets (12.5 mg total) by mouth at bedtime. (Patient taking differently: Take 100 mg by mouth at bedtime. )  . norethindrone-ethinyl estradiol-iron (MICROGESTIN FE,GILDESS FE,LOESTRIN FE) 1.5-30 MG-MCG tablet Take 1 tablet by mouth daily.  . potassium chloride (K-DUR) 10 MEQ tablet Take 1 tablet (10 mEq total) by mouth 2 (two) times daily as needed.  .  rosuvastatin (CRESTOR) 40 MG tablet Take 1 tablet (40 mg total) by mouth daily.  . sitaGLIPtin (JANUVIA) 100 MG tablet Take 100 mg by mouth daily.    Marland Kitchen warfarin (COUMADIN) 5 MG tablet Take as directed by [facility] (Patient taking differently: Take 7.5 mg by mouth daily. Take as directed by [facility])  . zolpidem (AMBIEN) 10 MG tablet Take 10 mg by mouth at bedtime as needed.      Past Medical History:  Diagnosis Date  . Acute on chronic diastolic CHF (congestive heart failure) (HCC) 01/24/2015  . Anemia   . Anxiety   . Coronary artery disease    Status post coronary stenting approximately 10 years ago  . Depression   . Hyperlipidemia   . Hypertension   . Peripheral artery disease (HCC)   . Pulmonary embolism (HCC)   . Type 2 diabetes mellitus (HCC)     Past Surgical History:  Procedure Laterality Date  . ABDOMINAL AORTOGRAM W/LOWER EXTREMITY N/A 12/14/2016   Procedure: Abdominal Aortogram w/Lower Extremity;  Surgeon: Renford Dills, MD;  Location: [facility]IVE [facility];  Service: Cardiovascular;  Laterality: N/A;  . APPENDECTOMY    . CARDIAC CATHETERIZATION N/A 01/03/2015   Procedure: Left Heart Cath;  Surgeon: Antonieta Iba, MD;  Location: [facility];  Service: Cardiovascular;  Laterality: N/A;  . CESAREAN SECTION    . CORONARY ARTERY BYPASS GRAFT N/A 01/07/2015   Procedure: CORONARY ARTERY BYPASS GRAFTING (CABG) x4 using bilateral greater saphenous  vein and left internal mammary artery.;  Surgeon: Kerin Perna, MD;  Location: [facility] OR;  Service: Open Heart Surgery;  Laterality: N/A;  . LOWER EXTREMITY ANGIOGRAPHY Left 12/14/2016   Procedure: Lower Extremity Angiography;  Surgeon: Renford Dills, MD;  Location: [facility];  Service: Cardiovascular;  Laterality: Left;  . LOWER EXTREMITY INTERVENTION  12/14/2016   Procedure: Lower Extremity Intervention;  Surgeon: Renford Dills, MD;  Location: [facility];  Service: Cardiovascular;;  .  PERIPHERAL VASCULAR CATHETERIZATION N/A 10/21/2015   Procedure: Abdominal Aortogram w/Lower Extremity;  Surgeon: Renford Dills, MD;  Location: [facility];  Service: Cardiovascular;  Laterality: N/A;  . PERIPHERAL VASCULAR CATHETERIZATION  10/21/2015   Procedure: Lower Extremity Intervention;  Surgeon: Renford Dills, MD;  Location: [facility];  Service: Cardiovascular;;  . PERIPHERAL VASCULAR CATHETERIZATION Left 11/11/2015   Procedure: Renal Angiography;  Surgeon: Renford Dills, MD;  Location: [facility];  Service: Cardiovascular;  Laterality: Left;  . PERIPHERAL VASCULAR CATHETERIZATION Right 11/11/2015   Procedure: Lower Extremity Angiography;  Surgeon: Renford Dills, MD;  Location: [facility];  Service: Cardiovascular;  Laterality: Right;  . PERIPHERAL VASCULAR CATHETERIZATION  11/11/2015   Procedure: Lower Extremity Intervention;  Surgeon: Renford Dills, MD;  Location: [facility];  Service: Cardiovascular;;  . PERIPHERAL VASCULAR CATHETERIZATION  11/11/2015   Procedure: Renal Intervention;  Surgeon: Renford Dills, MD;  Location: [facility];  Service: Cardiovascular;;  . PERIPHERAL VASCULAR CATHETERIZATION Right 11/25/2015   Procedure: Lower Extremity Angiography;  Surgeon: Renford Dills, MD;  Location: [facility];  Service: Cardiovascular;  Laterality: Right;  . PERIPHERAL VASCULAR CATHETERIZATION  11/25/2015   Procedure: Lower Extremity Intervention;  Surgeon: Renford Dills, MD;  Location: [facility];  Service: Cardiovascular;;  . PERIPHERAL VASCULAR CATHETERIZATION Left 12/17/2015   Procedure: Lower Extremity Angiography;  Surgeon: Renford Dills, MD;  Location: [facility];  Service: Cardiovascular;  Laterality: Left;  . PERIPHERAL VASCULAR CATHETERIZATION  12/17/2015   Procedure: Lower Extremity Intervention;  Surgeon: Renford Dills, MD;  Location: [facility];   Service: Cardiovascular;;  . PERIPHERAL VASCULAR CATHETERIZATION Left 05/11/2016   Procedure: Lower Extremity Angiography;  Surgeon: Renford Dills, MD;  Location: [facility];  Service: Cardiovascular;  Laterality: Left;  . PERIPHERAL VASCULAR CATHETERIZATION  05/11/2016   Procedure: Lower Extremity Intervention;  Surgeon: Renford Dills, MD;  Location: [facility];  Service: Cardiovascular;;  . TONSILLECTOMY AND ADENOIDECTOMY    . TRANSLUMINAL ANGIOPLASTY  01/30/2013   L posterior tibial artery, L tibioperoneal trunk, L SFA  . TRANSLUMINAL ATHERECTOMY TIBIAL ARTERY  01/30/2013    Social History Social History  Substance Use Topics  . Smoking status: Never Smoker  . Smokeless tobacco: Never Used  . Alcohol use No    Family History Family History  Problem Relation Age of Onset  . Hypertension Mother   . Cancer Father     Allergies  Allergen Reactions  . Metformin And Related Diarrhea  . Biaxin [Clarithromycin] Itching  . Atorvastatin Cough  . Codeine Nausea Only and Other (See Comments)    Constipates; doesn't like the way it makes her feel   . Oxycodone Hcl Other (See Comments)    01/10/15 patient  tolerated 8 doses of oxycodone with no allergy symptoms 7/12 noted allergy including, Palpitations, difficulty breathing.     REVIEW OF  SYSTEMS (Negative unless checked)  Constitutional: [] Weight loss  [] Fever  [] Chills Cardiac: [] Chest pain   [] Chest pressure   [] Palpitations   [] Shortness of breath when laying flat   [] Shortness of breath with exertion. Vascular:  [x] Pain in legs with walking   [] Pain in legs at rest  [] History of DVT   [] Phlebitis   [] Swelling in legs   [] Varicose veins   [] Non-healing ulcers Pulmonary:   [] Uses home oxygen   [] Productive cough   [] Hemoptysis   [] Wheeze  [] COPD   [] Asthma Neurologic:  [] Dizziness   [] Seizures   [] History of stroke   [] History of TIA  [] Aphasia   [] Vissual changes   [] Weakness or numbness in arm    [] Weakness or numbness in leg Musculoskeletal:   [] Joint swelling   [] Joint pain   [] Low back pain Hematologic:  [] Easy bruising  [] Easy bleeding   [] Hypercoagulable state   [] Anemic Gastrointestinal:  [] Diarrhea   [] Vomiting  [] Gastroesophageal reflux/heartburn   [] Difficulty swallowing. Genitourinary:  [] Chronic kidney disease   [] Difficult urination  [] Frequent urination   [] Blood in urine Skin:  [] Rashes   [] Ulcers  Psychological:  [] History of anxiety   []  History of major depression.  Physical Examination  Vitals:   01/13/17 1503  BP: (!) 150/77  Pulse: 61  Resp: 16  Weight: 81.2 kg (179 lb)   Body mass index is 27.22 kg/m. Gen: WD/WN, NAD Head: [place]/AT, No temporalis wasting.  Ear/Nose/Throat: Hearing grossly intact, nares w/o erythema or drainage Eyes: PER, EOMI, sclera nonicteric.  Neck: Supple, no large masses.   Pulmonary:  Good air movement, no audible wheezing bilaterally, no use of accessory muscles.  Cardiac: RRR, no JVD Vascular:  Vessel Right Left  Radial Palpable Palpable  Ulnar Palpable Palpable  Brachial Palpable Palpable  Carotid Palpable Palpable  Femoral Palpable Palpable  Popliteal Palpable Palpable  PT Palpable Palpable  DP Palpable Not Palpable  Gastrointestinal: Non-distended. No guarding/no peritoneal signs.  Musculoskeletal: M/S 5/5 throughout.  No deformity or atrophy.  Neurologic: CN 2-12 intact. Symmetrical.  Speech is fluent. Motor exam as listed above. Psychiatric: Judgment intact, Mood & affect appropriate for pt's clinical situation. Dermatologic: No rashes or ulcers noted.  No changes consistent with cellulitis. Lymph : No lichenification or skin changes of chronic lymphedema.  CBC Lab Results  Component Value Date   WBC 5.8 12/20/2015   HGB 11.3 (L) 12/20/2015   HCT 33.5 (L) 12/20/2015   MCV 84.7 12/20/2015   PLT 177 12/20/2015    BMET    Component Value Date/Time   NA 138 12/20/2015 0453   NA 136 09/26/2013 0928   K 3.9  12/20/2015 0453   K 4.5 09/26/2013 0928   CL 107 12/20/2015 0453   CL 104 09/26/2013 0928   CO2 23 12/20/2015 0453   CO2 31 09/26/2013 0928   GLUCOSE 191 (H) 12/20/2015 0453   GLUCOSE 145 (H) 09/26/2013 0928   BUN 21 (H) 12/13/2016 1252   BUN 19 (H) 09/26/2013 0928   CREATININE 0.89 12/13/2016 1252   CREATININE 0.85 09/26/2013 0928   CALCIUM 8.8 (L) 12/20/2015 0453   CALCIUM 9.8 09/26/2013 0928   GFRNONAA >60 12/13/2016 1252   GFRNONAA >60 09/26/2013 0928   GFRAA >60 12/13/2016 1252   GFRAA >60 09/26/2013 0928   CrCl cannot be calculated (Patient's most recent lab result is older than the maximum 21 days allowed.).  COAG Lab Results  Component Value Date   INR 2.4 12/22/2016   INR 1.39 12/14/2016  INR 2.3 12/01/2016    Radiology No results found.   Assessment/Plan 1. PAD (peripheral artery disease) (HCC) Recommend:  The patient is status post successful angiogram with intervention.  The patient reports that the claudication symptoms and leg pain is essentially gone.   The patient denies lifestyle limiting changes at this point in time.  No further invasive studies, angiography or surgery at this time The patient should continue walking and begin a more formal exercise program.  The patient should continue antiplatelet therapy and aggressive treatment of the lipid abnormalities  Smoking cessation was again discussed  The patient should continue wearing graduated compression socks 10-15 mmHg strength to control the mild edema.  Patient should undergo noninvasive studies as ordered. The patient will follow up with me after the studies.   - VAS US ABI WITH/WO TBI; Future - VAS US LOWER EXTREMITY ARTERIAL DUPLEX; Future  2. Coronary artery disease involving coronary bypass graft of native heart without angina pectoris Continue cardiac and antihypertensive medications as already ordered and reviewed, no changes at this time.  Continue statin as ordered and reviewed,  no changes at this time  Nitrates PRN for chest pain   3. Essential hypertension Continue antihypertensive medications as already ordered, these medications have been reviewed and there are no changes at this time.   4. Type 2 diabetes mellitus with other circulatory complication, without long-term current use of insulin (HCC) Continue hypoglycemic medications as already ordered, these medications have been reviewed and there are no changes at this time.  Hgb A1C to be monitored as already arranged by primary service   5. Mixed hyperlipidemia Continue statin as ordered and reviewed, no changes at this time     Levora DredgeGregory Donica Derouin, MD  01/15/2017 9:41 PM

## 2017-01-19 ENCOUNTER — Ambulatory Visit (INDEPENDENT_AMBULATORY_CARE_PROVIDER_SITE_OTHER): Payer: Medicare Other

## 2017-01-19 DIAGNOSIS — Z5181 Encounter for therapeutic drug level monitoring: Secondary | ICD-10-CM | POA: Diagnosis not present

## 2017-01-19 DIAGNOSIS — I70222 Atherosclerosis of native arteries of extremities with rest pain, left leg: Secondary | ICD-10-CM

## 2017-01-19 DIAGNOSIS — I2699 Other pulmonary embolism without acute cor pulmonale: Secondary | ICD-10-CM | POA: Diagnosis not present

## 2017-01-19 DIAGNOSIS — I480 Paroxysmal atrial fibrillation: Secondary | ICD-10-CM | POA: Diagnosis not present

## 2017-01-19 LAB — POCT INR: INR: 3.4

## 2017-03-14 ENCOUNTER — Telehealth (INDEPENDENT_AMBULATORY_CARE_PROVIDER_SITE_OTHER): Payer: Self-pay | Admitting: Vascular Surgery

## 2017-03-14 NOTE — Telephone Encounter (Signed)
PT WENT TO SEE HER FOOT DR Zollie BeckersWALTER BURNS, HE TRIMMED A CALLOUS ON HER FOOT HOLE GOT BIG FROM CALLOUS, DR BURNS COULDN'T FEEL HER PULSE IN HER FOOT, HE GOT A DOPPLER TO HEAR AND IT WAS VERY FAINT.AND SHE HAS STAFF INFECTION PCP SAID SHE NEEDED TO BE SEEN ASAP

## 2017-03-14 NOTE — Telephone Encounter (Signed)
The patients doctor has contacted the office for a referral to get the patient in to see the doctor this month for her issue, per [name]

## 2017-03-16 ENCOUNTER — Ambulatory Visit (INDEPENDENT_AMBULATORY_CARE_PROVIDER_SITE_OTHER): Payer: Medicare Other

## 2017-03-16 DIAGNOSIS — Z5181 Encounter for therapeutic drug level monitoring: Secondary | ICD-10-CM

## 2017-03-16 DIAGNOSIS — E11621 Type 2 diabetes mellitus with foot ulcer: Secondary | ICD-10-CM | POA: Insufficient documentation

## 2017-03-16 DIAGNOSIS — L97521 Non-pressure chronic ulcer of other part of left foot limited to breakdown of skin: Secondary | ICD-10-CM | POA: Insufficient documentation

## 2017-03-16 DIAGNOSIS — I2699 Other pulmonary embolism without acute cor pulmonale: Secondary | ICD-10-CM

## 2017-03-16 DIAGNOSIS — I480 Paroxysmal atrial fibrillation: Secondary | ICD-10-CM | POA: Diagnosis not present

## 2017-03-16 LAB — POCT INR: INR: 2.4

## 2017-03-21 ENCOUNTER — Ambulatory Visit (INDEPENDENT_AMBULATORY_CARE_PROVIDER_SITE_OTHER): Payer: Medicare Other | Admitting: Vascular Surgery

## 2017-03-21 ENCOUNTER — Other Ambulatory Visit (INDEPENDENT_AMBULATORY_CARE_PROVIDER_SITE_OTHER): Payer: Self-pay

## 2017-03-21 ENCOUNTER — Encounter (INDEPENDENT_AMBULATORY_CARE_PROVIDER_SITE_OTHER): Payer: Self-pay

## 2017-03-21 ENCOUNTER — Ambulatory Visit (INDEPENDENT_AMBULATORY_CARE_PROVIDER_SITE_OTHER): Payer: Medicare Other

## 2017-03-21 ENCOUNTER — Encounter (INDEPENDENT_AMBULATORY_CARE_PROVIDER_SITE_OTHER): Payer: Self-pay | Admitting: Vascular Surgery

## 2017-03-21 ENCOUNTER — Other Ambulatory Visit (INDEPENDENT_AMBULATORY_CARE_PROVIDER_SITE_OTHER): Payer: Self-pay | Admitting: Vascular Surgery

## 2017-03-21 VITALS — BP 179/76 | HR 86 | Wt 178.8 lb

## 2017-03-21 DIAGNOSIS — I25810 Atherosclerosis of coronary artery bypass graft(s) without angina pectoris: Secondary | ICD-10-CM | POA: Diagnosis not present

## 2017-03-21 DIAGNOSIS — I480 Paroxysmal atrial fibrillation: Secondary | ICD-10-CM

## 2017-03-21 DIAGNOSIS — Z5181 Encounter for therapeutic drug level monitoring: Secondary | ICD-10-CM

## 2017-03-21 DIAGNOSIS — I7025 Atherosclerosis of native arteries of other extremities with ulceration: Secondary | ICD-10-CM | POA: Diagnosis not present

## 2017-03-21 DIAGNOSIS — I2699 Other pulmonary embolism without acute cor pulmonale: Secondary | ICD-10-CM

## 2017-03-21 DIAGNOSIS — I70202 Unspecified atherosclerosis of native arteries of extremities, left leg: Secondary | ICD-10-CM

## 2017-03-21 DIAGNOSIS — I70222 Atherosclerosis of native arteries of extremities with rest pain, left leg: Secondary | ICD-10-CM

## 2017-03-21 DIAGNOSIS — E1159 Type 2 diabetes mellitus with other circulatory complications: Secondary | ICD-10-CM | POA: Diagnosis not present

## 2017-03-21 DIAGNOSIS — E782 Mixed hyperlipidemia: Secondary | ICD-10-CM | POA: Diagnosis not present

## 2017-03-21 LAB — POCT INR: INR: 3.7

## 2017-03-21 NOTE — Progress Notes (Signed)
MRN : [id number]  [name] is a 66 y.o. (April 04, 1951) female who presents with chief complaint of  Chief Complaint  Patient presents with  . Foot Pain    Pt stated pt have open sore bottom of the forefoot/callus having pain and no pulses check by Dr. Dorette Grate for about 7 month  .  History of Present Illness: The patient returns to the office for followup regarding her PAD now associated with an ulcer of the plantar surface of the left foot. There has been a significant deterioration in the lower extremity symptoms.  The patient notes interval shortening of their claudication distance and development of mild rest pain symptoms.  Dr Lawerance Bach, (wound care center) was unable to palpate her PT pulse  There have been no significant changes to the patient's overall health care.  The patient denies amaurosis fugax or recent TIA symptoms. There are no recent neurological changes noted. The patient denies history of DVT, PE or superficial thrombophlebitis. The patient denies recent episodes of angina or shortness of breath.    Current Meds  Medication Sig  . aspirin 81 MG EC tablet Take 1 tablet (81 mg total) by mouth daily.  Marland Kitchen atenolol (TENORMIN) 100 MG tablet Take 0.5 tablets (50 mg total) by mouth daily. (Patient taking differently: Take 100 mg by mouth daily. )  . ciprofloxacin (CIPRO) 500 MG tablet   . furosemide (LASIX) 20 MG tablet Take 1 tablet (20 mg total) by mouth daily as needed.  Marland Kitchen GLIPIZIDE XL 10 MG 24 hr tablet   . guaiFENesin (MUCINEX) 600 MG 12 hr tablet Take 1 tablet (600 mg total) by mouth daily. Takes 1 tab daily, can take addt'l tab as needed for mucus  . isosorbide mononitrate (IMDUR) 30 MG 24 hr tablet Take 1 tablet (30 mg total) by mouth 2 (two) times daily.  . norethindrone-ethinyl estradiol-iron (MICROGESTIN FE,GILDESS FE,LOESTRIN FE) 1.5-30 MG-MCG tablet Take 1 tablet by mouth daily.  . potassium chloride (K-DUR) 10 MEQ tablet Take 1 tablet (10 mEq total) by mouth 2  (two) times daily as needed.  . sitaGLIPtin (JANUVIA) 100 MG tablet Take 100 mg by mouth daily.    Marland Kitchen warfarin (COUMADIN) 5 MG tablet Take as directed by [facility] (Patient taking differently: Take 7.5 mg by mouth daily. Take as directed by [facility])  . zolpidem (AMBIEN) 10 MG tablet Take 10 mg by mouth at bedtime as needed.    . [DISCONTINUED] losartan (COZAAR) 25 MG tablet Take 0.5 tablets (12.5 mg total) by mouth at bedtime. (Patient taking differently: Take 100 mg by mouth at bedtime. )    Past Medical History:  Diagnosis Date  . Acute on chronic diastolic CHF (congestive heart failure) (HCC) 01/24/2015  . Anemia   . Anxiety   . Coronary artery disease    Status post coronary stenting approximately 10 years ago  . Depression   . Hyperlipidemia   . Hypertension   . Peripheral artery disease (HCC)   . Pulmonary embolism (HCC)   . Type 2 diabetes mellitus (HCC)     Past Surgical History:  Procedure Laterality Date  . ABDOMINAL AORTOGRAM W/LOWER EXTREMITY N/A 12/14/2016   Procedure: Abdominal Aortogram w/Lower Extremity;  Surgeon: Renford Dills, MD;  Location: [facility] INVASIVE [facility];  Service: Cardiovascular;  Laterality: N/A;  . APPENDECTOMY    . CARDIAC CATHETERIZATION N/A 01/03/2015   Procedure: Left Heart Cath;  Surgeon: Antonieta Iba, MD;  Location: [facility];  Service: Cardiovascular;  Laterality: N/A;  . CESAREAN SECTION    . CORONARY ARTERY BYPASS GRAFT N/A 01/07/2015   Procedure: CORONARY ARTERY BYPASS GRAFTING (CABG) x4 using bilateral greater saphenous vein and left internal mammary artery.;  Surgeon: Kerin Perna, MD;  Location: [facility] OR;  Service: Open Heart Surgery;  Laterality: N/A;  . LOWER EXTREMITY ANGIOGRAPHY Left 12/14/2016   Procedure: Lower Extremity Angiography;  Surgeon: Renford Dills, MD;  Location: [facility];  Service: Cardiovascular;  Laterality: Left;  . LOWER EXTREMITY INTERVENTION  12/14/2016   Procedure: Lower  Extremity Intervention;  Surgeon: Renford Dills, MD;  Location: [facility] CV LAB;  Service: Cardiovascular;;  . PERIPHERAL VASCULAR CATHETERIZATION N/A 10/21/2015   Procedure: Abdominal Aortogram w/Lower Extremity;  Surgeon: Renford Dills, MD;  Location: [facility];  Service: Cardiovascular;  Laterality: N/A;  . PERIPHERAL VASCULAR CATHETERIZATION  10/21/2015   Procedure: Lower Extremity Intervention;  Surgeon: Renford Dills, MD;  Location: [facility];  Service: Cardiovascular;;  . PERIPHERAL VASCULAR CATHETERIZATION Left 11/11/2015   Procedure: Renal Angiography;  Surgeon: Renford Dills, MD;  Location: [facility];  Service: Cardiovascular;  Laterality: Left;  . PERIPHERAL VASCULAR CATHETERIZATION Right 11/11/2015   Procedure: Lower Extremity Angiography;  Surgeon: Renford Dills, MD;  Location: [facility];  Service: Cardiovascular;  Laterality: Right;  . PERIPHERAL VASCULAR CATHETERIZATION  11/11/2015   Procedure: Lower Extremity Intervention;  Surgeon: Renford Dills, MD;  Location: [facility];  Service: Cardiovascular;;  . PERIPHERAL VASCULAR CATHETERIZATION  11/11/2015   Procedure: Renal Intervention;  Surgeon: Renford Dills, MD;  Location: [facility];  Service: Cardiovascular;;  . PERIPHERAL VASCULAR CATHETERIZATION Right 11/25/2015   Procedure: Lower Extremity Angiography;  Surgeon: Renford Dills, MD;  Location: [facility];  Service: Cardiovascular;  Laterality: Right;  . PERIPHERAL VASCULAR CATHETERIZATION  11/25/2015   Procedure: Lower Extremity Intervention;  Surgeon: Renford Dills, MD;  Location: [facility];  Service: Cardiovascular;;  . PERIPHERAL VASCULAR CATHETERIZATION Left 12/17/2015   Procedure: Lower Extremity Angiography;  Surgeon: Renford Dills, MD;  Location: [facility];  Service: Cardiovascular;  Laterality: Left;  . PERIPHERAL VASCULAR CATHETERIZATION   12/17/2015   Procedure: Lower Extremity Intervention;  Surgeon: Renford Dills, MD;  Location: [facility];  Service: Cardiovascular;;  . PERIPHERAL VASCULAR CATHETERIZATION Left 05/11/2016   Procedure: Lower Extremity Angiography;  Surgeon: Renford Dills, MD;  Location: [facility];  Service: Cardiovascular;  Laterality: Left;  . PERIPHERAL VASCULAR CATHETERIZATION  05/11/2016   Procedure: Lower Extremity Intervention;  Surgeon: Renford Dills, MD;  Location: [facility] IN[facility];  Service: Cardiovascular;;  . TONSILLECTOMY AND ADENOIDECTOMY    . TRANSLUMINAL ANGIOPLASTY  01/30/2013   L posterior tibial artery, L tibioperoneal trunk, L SFA  . TRANSLUMINAL ATHERECTOMY TIBIAL ARTERY  01/30/2013    Social History Social History  Substance Use Topics  . Smoking status: Never Smoker  . Smokeless tobacco: Never Used  . Alcohol use No    Family History Family History  Problem Relation Age of Onset  . Hypertension Mother   . Cancer Father     Allergies  Allergen Reactions  . Metformin And Related Diarrhea  . Biaxin [Clarithromycin] Itching  . Atorvastatin Cough  . Codeine Nausea Only and Other (See Comments)    Constipates; doesn't like the way it makes her feel   . Oxycodone  Hcl Other (See Comments)    01/10/15 patient  tolerated 8 doses of oxycodone with no allergy symptoms 7/12 noted allergy including, Palpitations, difficulty breathing.     REVIEW OF SYSTEMS (Negative unless checked)  Constitutional: [] Weight loss  [] Fever  [] Chills Cardiac: [] Chest pain   [] Chest pressure   [] Palpitations   [] Shortness of breath when laying flat   [] Shortness of breath with exertion. Vascular:  [x] Pain in legs with walking   [x] Pain in legs at rest  [] History of DVT   [] Phlebitis   [] Swelling in legs   [] Varicose veins   [x] Non-healing ulcers Pulmonary:   [] Uses home oxygen   [] Productive cough   [] Hemoptysis   [] Wheeze  [] COPD   [] Asthma Neurologic:  [] Dizziness    [] Seizures   [] History of stroke   [] History of TIA  [] Aphasia   [] Vissual changes   [] Weakness or numbness in arm   [] Weakness or numbness in leg Musculoskeletal:   [] Joint swelling   [] Joint pain   [] Low back pain Hematologic:  [] Easy bruising  [] Easy bleeding   [] Hypercoagulable state   [] Anemic Gastrointestinal:  [] Diarrhea   [] Vomiting  [] Gastroesophageal reflux/heartburn   [] Difficulty swallowing. Genitourinary:  [] Chronic kidney disease   [] Difficult urination  [] Frequent urination   [] Blood in urine Skin:  [x] Rashes   [x] Ulcers  Psychological:  [] History of anxiety   []  History of major depression.  Physical Examination  Vitals:   03/21/17 0941  BP: (!) 179/76  Pulse: 86  Weight: 178 lb 12.8 oz (81.1 kg)   Body mass index is 27.19 kg/m. Gen: WD/WN, NAD Head: [place]/AT, No temporalis wasting.  Ear/Nose/Throat: Hearing grossly intact, nares w/o erythema or drainage Eyes: PER, EOMI, sclera nonicteric.  Neck: Supple, no large masses.   Pulmonary:  Good air movement, no audible wheezing bilaterally, no use of accessory muscles.  Cardiac: RRR, no JVD Vascular:  Left foot ulcer on the plantar surgace associated with callous. Vessel Right Left  Radial Palpable Palpable  Popliteal Palpable Palpable  PT Palpable Not Palpable  DP Palpable Not Palpable  Gastrointestinal: Non-distended. No guarding/no peritoneal signs.  Musculoskeletal: M/S 5/5 throughout.  No deformity or atrophy.  Neurologic: CN 2-12 intact. Symmetrical.  Speech is fluent. Motor exam as listed above. Psychiatric: Judgment intact, Mood & affect appropriate for pt's clinical situation. Dermatologic: mild venous rashes pressure ulcer noted left foot.  No changes consistent with cellulitis. Lymph : No lichenification or skin changes of chronic lymphedema.  CBC Lab Results  Component Value Date   WBC 5.8 12/20/2015   HGB 11.3 (L) 12/20/2015   HCT 33.5 (L) 12/20/2015   MCV 84.7 12/20/2015   PLT 177 12/20/2015     BMET    Component Value Date/Time   NA 138 12/20/2015 0453   NA 136 09/26/2013 0928   K 3.9 12/20/2015 0453   K 4.5 09/26/2013 0928   CL 107 12/20/2015 0453   CL 104 09/26/2013 0928   CO2 23 12/20/2015 0453   CO2 31 09/26/2013 0928   GLUCOSE 191 (H) 12/20/2015 0453   GLUCOSE 145 (H) 09/26/2013 0928   BUN 21 (H) 12/13/2016 1252   BUN 19 (H) 09/26/2013 0928   CREATININE 0.89 12/13/2016 1252   CREATININE 0.85 09/26/2013 0928   CALCIUM 8.8 (L) 12/20/2015 0453   CALCIUM 9.8 09/26/2013 0928   GFRNONAA >60 12/13/2016 1252   GFRNONAA >60 09/26/2013 0928   GFRAA >60 12/13/2016 1252   GFRAA >60 09/26/2013 0928   CrCl cannot be calculated (Patient's most  recent lab result is older than the maximum 21 days allowed.).  COAG Lab Results  Component Value Date   INR 2.4 03/16/2017   INR 3.4 01/19/2017   INR 2.4 12/22/2016    Radiology No results found.  Assessment/Plan 1. Atherosclerosis of native arteries of the extremities with ulceration (HCC)  Recommend:  The patient has evidence of severe atherosclerotic changes of both lower extremities associated with ulceration and tissue loss of the left foot.  This represents a limb threatening ischemia and places the patient at the risk for limb loss.  Patient should undergo angiography of the left lower extremity with the hope for intervention for limb salvage.  The risks and benefits as well as the alternative therapies was discussed in detail with the patient.  All questions were answered.  Patient agrees to proceed with angiography.  The patient will follow up with me in the office after the procedure.    2. Mixed hyperlipidemia Continue statin as ordered and reviewed, no changes at this time   3. Type 2 diabetes mellitus with other circulatory complication, without long-term current use of insulin (HCC) Continue hypoglycemic medications as already ordered, these medications have been reviewed and there are no changes at this  time.  Hgb A1C to be monitored as already arranged by primary service   4. Coronary artery disease involving coronary bypass graft of native heart without angina pectoris Continue cardiac and antihypertensive medications as already ordered and reviewed, no changes at this time.  Continue statin as ordered and reviewed, no changes at this time  Nitrates PRN for chest pain   5. PAF (paroxysmal atrial fibrillation) (HCC) Continue antiarrhythmia medications as already ordered, these medications have been reviewed and there are no changes at this time.  Continue anticoagulation as ordered by Cardiology Service     [name], MD  03/21/2017 12:33 PM

## 2017-03-28 ENCOUNTER — Ambulatory Visit
Admission: RE | Admit: 2017-03-28 | Discharge: 2017-03-28 | Disposition: A | Discharge status: Home / Self Care | Payer: Medicare Other | Source: Ambulatory Visit | Attending: Vascular Surgery | Admitting: Vascular Surgery

## 2017-03-28 ENCOUNTER — Other Ambulatory Visit (INDEPENDENT_AMBULATORY_CARE_PROVIDER_SITE_OTHER): Payer: Self-pay

## 2017-03-28 ENCOUNTER — Inpatient Hospital Stay
Admission: RE | Admit: 2017-03-28 | Discharge: 2017-03-30 | DRG: 271 | Disposition: A | Discharge status: Home / Self Care | Payer: Medicare Other | Source: Ambulatory Visit | Attending: Vascular Surgery | Admitting: Vascular Surgery

## 2017-03-28 ENCOUNTER — Encounter
Admission: RE | Admit: 2017-03-28 | Discharge: 2017-03-28 | Disposition: A | Discharge status: Home / Self Care | Payer: Medicare Other | Source: Ambulatory Visit | Attending: Vascular Surgery | Admitting: Vascular Surgery

## 2017-03-28 DIAGNOSIS — L97529 Non-pressure chronic ulcer of other part of left foot with unspecified severity: Secondary | ICD-10-CM | POA: Diagnosis not present

## 2017-03-28 DIAGNOSIS — E1151 Type 2 diabetes mellitus with diabetic peripheral angiopathy without gangrene: Secondary | ICD-10-CM | POA: Diagnosis not present

## 2017-03-28 DIAGNOSIS — I5032 Chronic diastolic (congestive) heart failure: Secondary | ICD-10-CM | POA: Diagnosis not present

## 2017-03-28 DIAGNOSIS — I509 Heart failure, unspecified: Secondary | ICD-10-CM | POA: Insufficient documentation

## 2017-03-28 DIAGNOSIS — R001 Bradycardia, unspecified: Secondary | ICD-10-CM

## 2017-03-28 DIAGNOSIS — I480 Paroxysmal atrial fibrillation: Secondary | ICD-10-CM | POA: Diagnosis not present

## 2017-03-28 DIAGNOSIS — Z01810 Encounter for preprocedural cardiovascular examination: Secondary | ICD-10-CM | POA: Insufficient documentation

## 2017-03-28 DIAGNOSIS — Z7901 Long term (current) use of anticoagulants: Secondary | ICD-10-CM

## 2017-03-28 DIAGNOSIS — I110 Hypertensive heart disease with heart failure: Secondary | ICD-10-CM | POA: Diagnosis not present

## 2017-03-28 DIAGNOSIS — E11621 Type 2 diabetes mellitus with foot ulcer: Secondary | ICD-10-CM | POA: Diagnosis not present

## 2017-03-28 DIAGNOSIS — I70201 Unspecified atherosclerosis of native arteries of extremities, right leg: Secondary | ICD-10-CM | POA: Diagnosis present

## 2017-03-28 DIAGNOSIS — Z955 Presence of coronary angioplasty implant and graft: Secondary | ICD-10-CM | POA: Diagnosis not present

## 2017-03-28 DIAGNOSIS — I70299 Other atherosclerosis of native arteries of extremities, unspecified extremity: Secondary | ICD-10-CM | POA: Diagnosis present

## 2017-03-28 DIAGNOSIS — G4700 Insomnia, unspecified: Secondary | ICD-10-CM | POA: Diagnosis present

## 2017-03-28 DIAGNOSIS — Z951 Presence of aortocoronary bypass graft: Secondary | ICD-10-CM

## 2017-03-28 DIAGNOSIS — Z885 Allergy status to narcotic agent status: Secondary | ICD-10-CM

## 2017-03-28 DIAGNOSIS — F329 Major depressive disorder, single episode, unspecified: Secondary | ICD-10-CM | POA: Diagnosis present

## 2017-03-28 DIAGNOSIS — E782 Mixed hyperlipidemia: Secondary | ICD-10-CM | POA: Diagnosis present

## 2017-03-28 DIAGNOSIS — I517 Cardiomegaly: Secondary | ICD-10-CM

## 2017-03-28 DIAGNOSIS — Z881 Allergy status to other antibiotic agents status: Secondary | ICD-10-CM

## 2017-03-28 DIAGNOSIS — Z86711 Personal history of pulmonary embolism: Secondary | ICD-10-CM

## 2017-03-28 DIAGNOSIS — Z7984 Long term (current) use of oral hypoglycemic drugs: Secondary | ICD-10-CM

## 2017-03-28 DIAGNOSIS — Z8249 Family history of ischemic heart disease and other diseases of the circulatory system: Secondary | ICD-10-CM

## 2017-03-28 DIAGNOSIS — I70244 Atherosclerosis of native arteries of left leg with ulceration of heel and midfoot: Secondary | ICD-10-CM | POA: Diagnosis not present

## 2017-03-28 DIAGNOSIS — Z01812 Encounter for preprocedural laboratory examination: Secondary | ICD-10-CM

## 2017-03-28 DIAGNOSIS — I25810 Atherosclerosis of coronary artery bypass graft(s) without angina pectoris: Secondary | ICD-10-CM | POA: Diagnosis present

## 2017-03-28 DIAGNOSIS — L97909 Non-pressure chronic ulcer of unspecified part of unspecified lower leg with unspecified severity: Secondary | ICD-10-CM

## 2017-03-28 DIAGNOSIS — Z888 Allergy status to other drugs, medicaments and biological substances status: Secondary | ICD-10-CM

## 2017-03-28 DIAGNOSIS — Z7982 Long term (current) use of aspirin: Secondary | ICD-10-CM

## 2017-03-28 HISTORY — DX: Unspecified atrial fibrillation: I48.91

## 2017-03-28 HISTORY — DX: Other complications of anesthesia, initial encounter: T88.59XA

## 2017-03-28 HISTORY — DX: Adverse effect of unspecified anesthetic, initial encounter: T41.45XA

## 2017-03-28 LAB — BASIC METABOLIC PANEL
Anion gap: 6 (ref 5–15)
BUN: 24 mg/dL — AB (ref 6–20)
CALCIUM: 9.3 mg/dL (ref 8.9–10.3)
CO2: 27 mmol/L (ref 22–32)
CREATININE: 0.77 mg/dL (ref 0.44–1.00)
Chloride: 104 mmol/L (ref 101–111)
GFR calc Af Amer: >60 mL/min (ref >60)
GLUCOSE: 150 mg/dL — AB (ref 65–99)
Potassium: 4.9 mmol/L (ref 3.5–5.1)
SODIUM: 137 mmol/L (ref 135–145)

## 2017-03-28 LAB — PROTIME-INR
INR: 1.22
PROTHROMBIN TIME: 15.5 s — AB (ref 11.4–15.2)

## 2017-03-28 MED ORDER — CEFAZOLIN SODIUM-DEXTROSE 1-4 GM/50ML-% IV SOLN
1.0000 g | Freq: Once | INTRAVENOUS | Status: AC
  Administered 2017-03-29: 1 g via INTRAVENOUS
  Filled 2017-03-28: qty 50

## 2017-03-29 ENCOUNTER — Encounter: Payer: Self-pay | Admitting: *Deleted

## 2017-03-29 ENCOUNTER — Ambulatory Visit: Payer: Medicare Other

## 2017-03-29 ENCOUNTER — Ambulatory Visit: Payer: Medicare Other | Admitting: Registered Nurse

## 2017-03-29 ENCOUNTER — Encounter
Admission: RE | Disposition: A | Discharge status: Home / Self Care | Payer: Self-pay | Source: Ambulatory Visit | Attending: Vascular Surgery

## 2017-03-29 DIAGNOSIS — Z881 Allergy status to other antibiotic agents status: Secondary | ICD-10-CM | POA: Diagnosis not present

## 2017-03-29 DIAGNOSIS — Z86711 Personal history of pulmonary embolism: Secondary | ICD-10-CM | POA: Diagnosis not present

## 2017-03-29 DIAGNOSIS — I110 Hypertensive heart disease with heart failure: Secondary | ICD-10-CM | POA: Diagnosis not present

## 2017-03-29 DIAGNOSIS — Z888 Allergy status to other drugs, medicaments and biological substances status: Secondary | ICD-10-CM | POA: Diagnosis not present

## 2017-03-29 DIAGNOSIS — I70248 Atherosclerosis of native arteries of left leg with ulceration of other part of lower left leg: Secondary | ICD-10-CM

## 2017-03-29 DIAGNOSIS — E11621 Type 2 diabetes mellitus with foot ulcer: Secondary | ICD-10-CM | POA: Diagnosis not present

## 2017-03-29 DIAGNOSIS — I70244 Atherosclerosis of native arteries of left leg with ulceration of heel and midfoot: Secondary | ICD-10-CM | POA: Diagnosis present

## 2017-03-29 DIAGNOSIS — L97529 Non-pressure chronic ulcer of other part of left foot with unspecified severity: Secondary | ICD-10-CM | POA: Diagnosis not present

## 2017-03-29 DIAGNOSIS — I70299 Other atherosclerosis of native arteries of extremities, unspecified extremity: Secondary | ICD-10-CM | POA: Diagnosis present

## 2017-03-29 DIAGNOSIS — I5032 Chronic diastolic (congestive) heart failure: Secondary | ICD-10-CM | POA: Diagnosis not present

## 2017-03-29 DIAGNOSIS — Z951 Presence of aortocoronary bypass graft: Secondary | ICD-10-CM | POA: Diagnosis not present

## 2017-03-29 DIAGNOSIS — I25810 Atherosclerosis of coronary artery bypass graft(s) without angina pectoris: Secondary | ICD-10-CM | POA: Diagnosis not present

## 2017-03-29 DIAGNOSIS — Z955 Presence of coronary angioplasty implant and graft: Secondary | ICD-10-CM | POA: Diagnosis not present

## 2017-03-29 DIAGNOSIS — Z8249 Family history of ischemic heart disease and other diseases of the circulatory system: Secondary | ICD-10-CM | POA: Diagnosis not present

## 2017-03-29 DIAGNOSIS — E782 Mixed hyperlipidemia: Secondary | ICD-10-CM | POA: Diagnosis not present

## 2017-03-29 DIAGNOSIS — Z885 Allergy status to narcotic agent status: Secondary | ICD-10-CM | POA: Diagnosis not present

## 2017-03-29 DIAGNOSIS — G4700 Insomnia, unspecified: Secondary | ICD-10-CM | POA: Diagnosis not present

## 2017-03-29 DIAGNOSIS — I480 Paroxysmal atrial fibrillation: Secondary | ICD-10-CM | POA: Diagnosis not present

## 2017-03-29 DIAGNOSIS — I70201 Unspecified atherosclerosis of native arteries of extremities, right leg: Secondary | ICD-10-CM | POA: Diagnosis not present

## 2017-03-29 DIAGNOSIS — L97909 Non-pressure chronic ulcer of unspecified part of unspecified lower leg with unspecified severity: Secondary | ICD-10-CM

## 2017-03-29 DIAGNOSIS — E1151 Type 2 diabetes mellitus with diabetic peripheral angiopathy without gangrene: Secondary | ICD-10-CM | POA: Diagnosis not present

## 2017-03-29 HISTORY — PX: LOWER EXTREMITY ANGIOGRAPHY: CATH118251

## 2017-03-29 LAB — GLUCOSE, CAPILLARY
Glucose-Capillary: 187 mg/dL — ABNORMAL HIGH (ref 65–99)
Glucose-Capillary: 192 mg/dL — ABNORMAL HIGH (ref 65–99)

## 2017-03-29 SURGERY — LOWER EXTREMITY ANGIOGRAPHY
Anesthesia: General | Laterality: Left

## 2017-03-29 MED ORDER — INSULIN ASPART 100 UNIT/ML ~~LOC~~ SOLN: 0.0000 [IU] | Freq: Three times a day (TID) | SUBCUTANEOUS | Status: DC

## 2017-03-29 MED ORDER — MIDAZOLAM HCL 2 MG/2ML IJ SOLN
INTRAMUSCULAR | Status: AC
  Filled 2017-03-29: qty 2

## 2017-03-29 MED ORDER — PROPOFOL 10 MG/ML IV BOLUS
INTRAVENOUS | Status: AC
  Filled 2017-03-29: qty 20

## 2017-03-29 MED ORDER — PROPOFOL 10 MG/ML IV BOLUS
INTRAVENOUS | Status: DC | PRN
  Administered 2017-03-29: 150 mg via INTRAVENOUS

## 2017-03-29 MED ORDER — ONDANSETRON HCL 4 MG/2ML IJ SOLN: 4.0000 mg | Freq: Four times a day (QID) | INTRAMUSCULAR | Status: DC | PRN

## 2017-03-29 MED ORDER — PHENYLEPHRINE HCL 10 MG/ML IJ SOLN
INTRAMUSCULAR | Status: DC | PRN
  Administered 2017-03-29 (×5): 100 ug via INTRAVENOUS

## 2017-03-29 MED ORDER — HEPARIN (PORCINE) IN NACL 2-0.9 UNIT/ML-% IJ SOLN
INTRAMUSCULAR | Status: AC
  Filled 2017-03-29: qty 1500

## 2017-03-29 MED ORDER — LIDOCAINE HCL (PF) 2 % IJ SOLN
INTRAMUSCULAR | Status: AC
  Filled 2017-03-29: qty 2

## 2017-03-29 MED ORDER — GLYCOPYRROLATE 0.2 MG/ML IJ SOLN
INTRAMUSCULAR | Status: AC
  Filled 2017-03-29: qty 1

## 2017-03-29 MED ORDER — ONDANSETRON HCL 4 MG PO TABS: 4.0000 mg | ORAL_TABLET | Freq: Four times a day (QID) | ORAL | Status: DC | PRN

## 2017-03-29 MED ORDER — MIDAZOLAM HCL 2 MG/2ML IJ SOLN
INTRAMUSCULAR | Status: DC | PRN
  Administered 2017-03-29: 2 mg via INTRAVENOUS

## 2017-03-29 MED ORDER — FENTANYL CITRATE (PF) 100 MCG/2ML IJ SOLN
INTRAMUSCULAR | Status: DC | PRN
  Administered 2017-03-29 (×4): 25 ug via INTRAVENOUS

## 2017-03-29 MED ORDER — SODIUM CHLORIDE 0.9 % IJ SOLN
INTRAMUSCULAR | Status: AC
  Filled 2017-03-29: qty 50

## 2017-03-29 MED ORDER — ZOLPIDEM TARTRATE 5 MG PO TABS
5.0000 mg | ORAL_TABLET | Freq: Every day | ORAL | Status: DC
  Administered 2017-03-29: 5 mg via ORAL
  Filled 2017-03-29: qty 1

## 2017-03-29 MED ORDER — ASPIRIN EC 81 MG PO TBEC
81.0000 mg | DELAYED_RELEASE_TABLET | Freq: Every day | ORAL | Status: DC
  Administered 2017-03-30: 81 mg via ORAL
  Filled 2017-03-29: qty 1

## 2017-03-29 MED ORDER — IOPAMIDOL (ISOVUE-300) INJECTION 61%
INTRAVENOUS | Status: DC | PRN
  Administered 2017-03-29: 120 mL via INTRAVENOUS

## 2017-03-29 MED ORDER — SORBITOL 70 % SOLN
30.0000 mL | Freq: Every day | Status: DC | PRN
  Filled 2017-03-29: qty 30

## 2017-03-29 MED ORDER — MAGNESIUM HYDROXIDE 400 MG/5ML PO SUSP: 30.0000 mL | Freq: Every day | ORAL | Status: DC | PRN

## 2017-03-29 MED ORDER — DEXTROSE-NACL 5-0.9 % IV SOLN
INTRAVENOUS | Status: DC
  Administered 2017-03-29 – 2017-03-30 (×2): via INTRAVENOUS

## 2017-03-29 MED ORDER — ONDANSETRON HCL 4 MG/2ML IJ SOLN: 4.0000 mg | Freq: Once | INTRAMUSCULAR | Status: DC | PRN

## 2017-03-29 MED ORDER — TIROFIBAN (AGGRASTAT) BOLUS VIA INFUSION
25.0000 ug/kg | Freq: Once | INTRAVENOUS | Status: AC
  Administered 2017-03-29: 1995 ug via INTRAVENOUS
  Filled 2017-03-29: qty 40

## 2017-03-29 MED ORDER — FAMOTIDINE 20 MG PO TABS
ORAL_TABLET | ORAL | Status: AC
  Filled 2017-03-29: qty 1

## 2017-03-29 MED ORDER — NITROGLYCERIN 5 MG/ML IV SOLN
INTRAVENOUS | Status: AC
  Filled 2017-03-29: qty 10

## 2017-03-29 MED ORDER — LACTATED RINGERS IV SOLN: INTRAVENOUS | Status: DC

## 2017-03-29 MED ORDER — FAMOTIDINE 20 MG PO TABS
20.0000 mg | ORAL_TABLET | Freq: Once | ORAL | Status: AC
  Administered 2017-03-29: 20 mg via ORAL

## 2017-03-29 MED ORDER — HYDROCODONE-ACETAMINOPHEN 5-325 MG PO TABS: 1.0000 | ORAL_TABLET | ORAL | Status: DC | PRN

## 2017-03-29 MED ORDER — EPHEDRINE SULFATE 50 MG/ML IJ SOLN
INTRAMUSCULAR | Status: DC | PRN
  Administered 2017-03-29 (×4): 10 mg via INTRAVENOUS
  Administered 2017-03-29: 5 mg via INTRAVENOUS
  Administered 2017-03-29: 10 mg via INTRAVENOUS
  Administered 2017-03-29: 5 mg via INTRAVENOUS
  Administered 2017-03-29 (×4): 10 mg via INTRAVENOUS

## 2017-03-29 MED ORDER — ACETAMINOPHEN 650 MG RE SUPP: 650.0000 mg | Freq: Four times a day (QID) | RECTAL | Status: DC | PRN

## 2017-03-29 MED ORDER — HEPARIN SODIUM (PORCINE) 1000 UNIT/ML IJ SOLN
INTRAMUSCULAR | Status: DC | PRN
  Administered 2017-03-29: 5000 [IU] via INTRAVENOUS
  Administered 2017-03-29: 2000 [IU] via INTRAVENOUS

## 2017-03-29 MED ORDER — FENTANYL CITRATE (PF) 100 MCG/2ML IJ SOLN
INTRAMUSCULAR | Status: AC
  Filled 2017-03-29: qty 2

## 2017-03-29 MED ORDER — GLYCOPYRROLATE 0.2 MG/ML IJ SOLN
INTRAMUSCULAR | Status: DC | PRN
  Administered 2017-03-29: 0.2 mg via INTRAVENOUS

## 2017-03-29 MED ORDER — DEXAMETHASONE SODIUM PHOSPHATE 10 MG/ML IJ SOLN
INTRAMUSCULAR | Status: DC | PRN
  Administered 2017-03-29: 5 mg via INTRAVENOUS

## 2017-03-29 MED ORDER — FLEET ENEMA 7-19 GM/118ML RE ENEM: 1.0000 | ENEMA | Freq: Once | RECTAL | Status: DC | PRN

## 2017-03-29 MED ORDER — EPHEDRINE SULFATE 50 MG/ML IJ SOLN
INTRAMUSCULAR | Status: AC
  Filled 2017-03-29: qty 1

## 2017-03-29 MED ORDER — DEXAMETHASONE SODIUM PHOSPHATE 10 MG/ML IJ SOLN
INTRAMUSCULAR | Status: AC
  Filled 2017-03-29: qty 1

## 2017-03-29 MED ORDER — DOCUSATE SODIUM 100 MG PO CAPS
100.0000 mg | ORAL_CAPSULE | Freq: Two times a day (BID) | ORAL | Status: DC
  Administered 2017-03-29 – 2017-03-30 (×2): 100 mg via ORAL
  Filled 2017-03-29 (×2): qty 1

## 2017-03-29 MED ORDER — SODIUM CHLORIDE 0.9 % IV SOLN
INTRAVENOUS | Status: DC
  Administered 2017-03-29 (×2): via INTRAVENOUS

## 2017-03-29 MED ORDER — LIDOCAINE HCL (PF) 1 % IJ SOLN
INTRAMUSCULAR | Status: AC
  Filled 2017-03-29: qty 30

## 2017-03-29 MED ORDER — ACETAMINOPHEN 325 MG PO TABS
650.0000 mg | ORAL_TABLET | Freq: Four times a day (QID) | ORAL | Status: DC | PRN
  Administered 2017-03-29: 650 mg via ORAL
  Filled 2017-03-29: qty 2

## 2017-03-29 MED ORDER — ONDANSETRON HCL 4 MG/2ML IJ SOLN
INTRAMUSCULAR | Status: AC
  Filled 2017-03-29: qty 2

## 2017-03-29 MED ORDER — FENTANYL CITRATE (PF) 100 MCG/2ML IJ SOLN
25.0000 ug | INTRAMUSCULAR | Status: DC | PRN
  Administered 2017-03-29: 50 ug via INTRAVENOUS
  Administered 2017-03-29: 25 ug via INTRAVENOUS

## 2017-03-29 MED ORDER — TIROFIBAN HCL IN NACL 5-0.9 MG/100ML-% IV SOLN
0.1500 ug/kg/min | INTRAVENOUS | Status: AC
  Administered 2017-03-29 (×2): 0.15 ug/kg/min via INTRAVENOUS
  Filled 2017-03-29 (×4): qty 100

## 2017-03-29 MED ORDER — ONDANSETRON HCL 4 MG/2ML IJ SOLN
INTRAMUSCULAR | Status: DC | PRN
  Administered 2017-03-29: 4 mg via INTRAVENOUS

## 2017-03-29 MED ORDER — NITROGLYCERIN 0.4 MG SL SUBL: 0.4000 mg | SUBLINGUAL_TABLET | SUBLINGUAL | Status: DC | PRN

## 2017-03-29 MED ORDER — HEPARIN SODIUM (PORCINE) 1000 UNIT/ML IJ SOLN
INTRAMUSCULAR | Status: AC
  Filled 2017-03-29: qty 1

## 2017-03-29 MED ORDER — CEFAZOLIN SODIUM-DEXTROSE 1-4 GM/50ML-% IV SOLN
INTRAVENOUS | Status: AC
  Filled 2017-03-29: qty 50

## 2017-03-29 MED ORDER — LIDOCAINE HCL (CARDIAC) 20 MG/ML IV SOLN
INTRAVENOUS | Status: DC | PRN
  Administered 2017-03-29: 80 mg via INTRAVENOUS

## 2017-03-29 SURGICAL SUPPLY — 43 items
BALLN ULTRVRSE 2.5X300X150 (BALLOONS) ×2
BALLN ULTRVRSE 2X100X150 (BALLOONS) ×2
BALLN ULTRVRSE 2X150X150 (BALLOONS) ×2
BALLN ULTRVRSE 2X150X150 OTW (BALLOONS) ×1
BALLN ULTRVRSE 3X100X150 (BALLOONS) ×2
BALLN ULTRVRSE 3X300X150 (BALLOONS) ×2
BALLN ULTRVRSE 3X300X150 OTW (BALLOONS) ×1
BALLN ULTRVRSE 3X40X150 (BALLOONS) ×2
BALLOON ULTRVRSE 2.5X300X150 (BALLOONS) IMPLANT
BALLOON ULTRVRSE 2X100X150 (BALLOONS) IMPLANT
BALLOON ULTRVRSE 2X150X150 OTW (BALLOONS) IMPLANT
BALLOON ULTRVRSE 3X100X150 (BALLOONS) IMPLANT
BALLOON ULTRVRSE 3X300X150 OTW (BALLOONS) IMPLANT
BALLOON ULTRVRSE 3X40X150 (BALLOONS) IMPLANT
CANNULA 5F STIFF (CANNULA) ×1 IMPLANT
CATH CROSSER S6 154CM (CATHETERS) ×1 IMPLANT
CATH CXI SUPP ST 2.6FR 150CM (CATHETERS) ×1 IMPLANT
CATH IMAGER II S 5FR 65CM (MISCELLANEOUS) ×1 IMPLANT
CATH USHER ANGLED 130CM (CATHETERS) ×1 IMPLANT
CATH USHER TPER 130CM (CATHETERS) ×1 IMPLANT
CROWN STEALTH MICRO-30 1.25MM (CATHETERS) ×1 IMPLANT
DEVICE PRESTO INFLATION (MISCELLANEOUS) ×1 IMPLANT
DEVICE SAFEGUARD 24CM (GAUZE/BANDAGES/DRESSINGS) ×1 IMPLANT
DEVICE STARCLOSE SE CLOSURE (Vascular Products) ×1 IMPLANT
DEVICE TORQUE (MISCELLANEOUS) ×1 IMPLANT
GLIDECATH ANGLED 4FR 120CM (CATHETERS) ×2 IMPLANT
GLIDEWIRE ANGLED SS 035X260CM (WIRE) ×1 IMPLANT
GUIDEWIRE PFTE-COATED .018X300 (WIRE) ×1 IMPLANT
KIT FLOWMATE PROCEDURAL (MISCELLANEOUS) ×3 IMPLANT
LUBRICANT VIPERSLIDE CORONARY (MISCELLANEOUS) ×1 IMPLANT
NDL ENTRY 21GA 7CM ECHOTIP (NEEDLE) IMPLANT
NEEDLE ENTRY 21GA 7CM ECHOTIP (NEEDLE) ×2 IMPLANT
PACK ANGIOGRAPHY (CUSTOM PROCEDURE TRAY) ×2 IMPLANT
SHEATH BRITE TIP 6FRX11 (SHEATH) ×1 IMPLANT
SHEATH RAABE 6FR (SHEATH) ×1 IMPLANT
SHIELD X-DRAPE GOLD 12X17 (MISCELLANEOUS) ×1 IMPLANT
TUBING CONTRAST HIGH PRESS 72 (TUBING) ×1 IMPLANT
VALVE HEMO TOUHY BORST Y (VALVE) ×1 IMPLANT
WIRE G V18X300CM (WIRE) ×2 IMPLANT
WIRE J 3MM .035X145CM (WIRE) ×1 IMPLANT
WIRE MAGIC TORQUE 260C (WIRE) ×1 IMPLANT
WIRE NITINOL .018 (WIRE) ×1 IMPLANT
WIRE VIPER WIRECTO 0.014 (WIRE) ×2 IMPLANT

## 2017-03-29 NOTE — Transfer of Care (Signed)
Immediate Anesthesia Transfer of Care Note  Patient: [name]  Procedure(s) Performed: Procedure(s): Lower Extremity Angiography (Left)  Patient Location: PACU  Anesthesia Type:General  Level of Consciousness: drowsy and patient cooperative  Airway & Oxygen Therapy: Patient Spontanous Breathing and Patient connected to face mask oxygen  Post-op Assessment: Report given to RN and Post -op Vital signs reviewed and stable  Post vital signs: Reviewed and stable  Last Vitals:  Vitals:   03/29/17 1233 03/29/17 1715  BP: (!) 187/65 (!) (P) 165/75  Pulse: (!) 52 (!) 52  Resp: 16 16  Temp: 36.6 C (!) (P) 36 C  SpO2: 99% 100%    Last Pain:  Vitals:   03/29/17 1233  PainSc: 6          Complications: No apparent anesthesia complications

## 2017-03-29 NOTE — Progress Notes (Signed)
Pt arrived to the unit sedated and stable and not easily arousable, report given by special procedures nurse, Aggrastat not started by special procedures nurse, Dr [name] called and made aware that pt arrived to the floor not on drip and no new orders received at this time, missing dose placed to pharmacy, will administer drug upon arrival of medication. Suzzette Righter, RN

## 2017-03-29 NOTE — Op Note (Signed)
[facility] Percutaneous Study/Intervention Procedural Note   Date of Surgery: 03/29/2017  Surgeon:  Katha Cabal, MD.  Pre-operative Diagnosis: Atherosclerotic occlusive disease bilateral lower extremities with ulceration of the left lower extremity   Post-operative diagnosis: Same  Procedure(s) Performed: 1. Introduction catheter into left lower extremity 3rd order catheter placement with additional third order catheter placement 2. Contrast injection left lower extremity for distal runoff   3. Combination Crosser atherectomy and CSI atherectomy of the left posterior tibial artery 4. Percutaneous transluminal angioplasty left posterior tibial artery with 2.0 through 3.0 diameter Ultraverse balloon             5.   Combination Crosser atherectomy and CSI atherectomy of the left anterior tibial artery                    6.   Percutaneous transluminal angioplasty of the left anterior tibial artery with 2.0 through 3.0 diameter Ultraverse balloon               7.  Star close closure right common femoral arteriotomy                Anesthesia: Conscious sedation was administered under my direct supervision by the interventional radiology RN. IV Versed plus fentanyl were utilized. Continuous ECG, pulse oximetry and blood pressure was monitored throughout the entire procedure. Conscious sedation was for a total of 249 minutes.  Sheath:  6 French Rabi sheath right common femoral artery  Contrast: 120 cc  Fluoroscopy Time: 41.2 minutes  Indications: [name] presents with atherosclerotic occlusive disease bilateral lower extremities. The patient has developed ulceration of the soft tissues of the left lower extremity. This places the patient at high risk for limb loss and amputation. The risks and benefits are reviewed all questions answered patient agrees to proceed.  Procedure: [name] is a 66  y.o. y.o. female who was identified and appropriate procedural time out was performed. The patient was then placed supine on the table and prepped and draped in the usual sterile fashion.   Ultrasound was placed in the sterile sleeve and the right groin was evaluated the right common femoral artery was echolucent and pulsatile indicating patency.  Image was recorded for the permanent record and under real-time visualization a microneedle was inserted into the common femoral artery microwire followed by a micro-sheath.  A J-wire was then advanced through the micro-sheath and a  5 [place] sheath was then inserted over a J-wire. J-wire was then advanced and a 5 French pigtail catheter was positioned at the level of T12. AP projection of the aorta was then obtained. Pigtail catheter was repositioned to above the bifurcation and a RAO view of the pelvis was obtained.  Subsequently a VCF catheter with the stiff angle Glidewire was used to cross the aortic bifurcation the catheter wire were advanced down into the left distal external iliac artery. Oblique view of the femoral bifurcation was then obtained and subsequently the wire was reintroduced and the pigtail catheter negotiated into the SFA representing third order catheter placement. Distal runoff was then performed.  5000 units of heparin was then given and allowed to circulate and a 6 [place] Rabi sheath was advanced up and over the bifurcation and positioned in the common femoral artery.  Wire and catheter were then used to negotiate the SFA and popliteal arteries which were free of hemodynamically significant lesions and the catheter was then positioned in the distal popliteal  where magnified imaging of the trifurcation was performed.  An advantage 0.018 wire was then negotiated into the posterior tibial artery down to the level at which it occluded.  The S6 Crosser catheter was then prepped on the field and a straight usher catheter was advanced into the  cul-de-sac of the posterior tibial under magnified imaging. Using the Crosser catheter the occlusion of the posterior tibial was negotiated down to the lateral plantar vessels.  Injection of contrast confirmed intraluminal positioning.  CSI catheter and 0.018 advantage were then advanced down into the distal posterior tibial tibial.  Distal runoff was then completed by hand injection through the catheter verifying intraluminal position and distal runoff.  At this point the CSI diamondback catheter was then utilized. The wire was exchanged for the Viper wire and the diamondback atherectomy catheter was used to treat the entire length of the posterior tibial from the origin down to the lateral plantar bled branches. All 3 speeds were utilized in serial fashion.  A series of balloon inflations were then performed in the posterior tibial beginning with a 2 mm balloon distally then increasing to a 2.5 slightly more proximally and ultimately a 3.0 x 30 Ultraverse balloon was then used to angioplasty the posterior tibial  artery. Inflations were to 10-12 atm for 1 full minute. Follow-up imaging demonstrated patency of the tibial artery and the anterior tibial artery was now addressed  The advantage wire was then negotiated into the anterior tibial and followed by a usher catheter. The S6 Crosser catheter was then re-prepped on the field and a straight usher catheter was advanced into the cul-de-sac of the posterior tibial under magnified imaging. Using the Crosser catheter the occlusion of the posterior tibial was negotiated down to the lateral plantar vessels.  Injection of contrast confirmed intraluminal positioning.  CSI catheter and 0.018 advantage were then advanced down into the distal posterior tibial tibial.  Distal runoff was then completed by hand injection through the catheter verifying intraluminal position and distal runoff.  At this point the CSI diamondback catheter was then utilized. The wire was  exchanged for the Viper wire and the diamondback atherectomy catheter was used to treat the entire length of the posterior tibial from the origin down to the lateral plantar bled branches. All 3 speeds were utilized in serial fashion.  As noted above for the posterior tibial a similar series of balloon inflations was then performed beginning distally. A 2 mm diameter balloon was used to angioplasty the distal anterior tibial and dorsalis pedis arteries. Inflations were to 14 atmospheres for 2 minutes. Subsequently 2.5 mm balloon and then a 3 mm balloon were also used in serial fashion. Follow-up imaging demonstrated patency.  There appears to be less than 50% stenosis throughout the anterior tibial although the flow was somewhat sluggish consistent with significant distal small vessel disease. It was for this reason that I elected to initiate her on Aggrastat overnight  After review of these images the sheath is pulled into the right external iliac oblique of the common femoral is obtained and a Star close device deployed. There no immediate Complications.  Findings: The abdominal aorta is opacified with a bolus injection contrast. Renal arteries are patent. The aorta itself has diffuse disease but no hemodynamically significant lesions. The common and external iliac arteries are widely patent bilaterally.  The left common femoral is widely patent as is the profunda femoris.  The SFA and popliteal are patent as well with atherosclerotic changes but no hemodynamically significant  lesions identified.  The trifurcation is heavily diseased with occlusion of the anterior tibial and posterior tibial arteries.  The peroneal artery remains patent and of reasonable caliber with fairly good collaterals at the level of the ankle.  Following utilization of both the Crosser and the CSI atherectomy catheters there is now recanalization first of the posterior tibial and the anterior tibial.   Following angioplasty up  to 3 mm there is now patency of both the posterior tibial and the anterior tibial, there appears to be less than 10% residual stenosis in both arteries. As noted above there is sluggish flow in the anterior tibial and therefore Aggrastat will be initiated overnight.  Disposition: Patient was taken to the recovery room in stable condition having tolerated the procedure well.  [name] 03/29/2017,7:43 PM

## 2017-03-29 NOTE — Anesthesia Preprocedure Evaluation (Signed)
Anesthesia Evaluation  Patient identified by MRN, date of birth, ID band  Reviewed: Allergy & Precautions, NPO status , Patient's Chart, lab work & pertinent test results  History of Anesthesia Complications Negative for: history of anesthetic complications  Airway Mallampati: I  TM Distance: >3 FB Neck ROM: Full    Dental  (+) Dental Advisory Given, Caps, Missing, Teeth Intact   Pulmonary neg pulmonary ROS,           Cardiovascular Exercise Tolerance: Good hypertension, Pt. on medications and Pt. on home beta blockers (-) angina+ CAD, + Cardiac Stents, + CABG, + Peripheral Vascular Disease and +CHF  (-) Past MI + dysrhythmias Atrial Fibrillation (-) Valvular Problems/Murmurs  EF 50-55%, valves OK   Neuro/Psych PSYCHIATRIC DISORDERS (Depression) negative neurological ROS     GI/Hepatic negative GI ROS, Neg liver ROS,   Endo/Other  diabetes, Oral Hypoglycemic Agents  Renal/GU negative Renal ROS  negative genitourinary   Musculoskeletal   Abdominal   Peds  Hematology  (+) Blood dyscrasia (Hb 10.8), ,   Anesthesia Other Findings Past Medical History: 01/24/2015: Acute on chronic diastolic CHF (congestive heart failure)  (HCC) No date: Anemia No date: Anxiety No date: Atrial fibrillation (HCC) No date: Complication of anesthesia     Comment:  difficult to induce sleep No date: Coronary artery disease     Comment:  Status post coronary stenting approximately 10 years ago No date: Depression No date: Hyperlipidemia No date: Hypertension No date: Peripheral artery disease (HCC) No date: Pulmonary embolism (HCC) No date: Type 2 diabetes mellitus (HCC)   Reproductive/Obstetrics negative OB ROS                             Anesthesia Physical  Anesthesia Plan  ASA: III  Anesthesia Plan: General   Post-op Pain Management:    Induction: Intravenous  PONV Risk Score and Plan: 3 and  Ondansetron, Dexamethasone and Midazolam  Airway Management Planned: LMA  Additional Equipment:   Intra-op Plan:   Post-operative Plan: Extubation in OR  Informed Consent: I have reviewed the patients History and Physical, chart, labs and discussed the procedure including the risks, benefits and alternatives for the proposed anesthesia with the patient or authorized representative who has indicated his/her understanding and acceptance.   Dental advisory given  Plan Discussed with: CRNA and Surgeon  Anesthesia Plan Comments:         Anesthesia Quick Evaluation

## 2017-03-29 NOTE — H&P (Signed)
[facility] History & Physical Update  The patient was interviewed and re-examined.  The patient's previous History and Physical has been reviewed and is unchanged.  There is no change in the plan of care. We plan to proceed with the scheduled procedure.  [name], MD  03/29/2017, 1:17 PM

## 2017-03-29 NOTE — Anesthesia Procedure Notes (Signed)
Procedure Name: LMA Insertion Date/Time: 03/29/2017 1:28 PM Performed by: Omer Jack Pre-anesthesia Checklist: Patient identified, Patient being monitored, Timeout performed, Emergency Drugs available and Suction available Patient Re-evaluated:Patient Re-evaluated prior to induction Oxygen Delivery Method: Circle system utilized Preoxygenation: Pre-oxygenation with 100% oxygen Induction Type: IV induction Ventilation: Mask ventilation without difficulty LMA: LMA inserted LMA Size: 3.5 Tube type: Oral Number of attempts: 1 Placement Confirmation: positive ETCO2 and breath sounds checked- equal and bilateral Tube secured with: Tape Dental Injury: Teeth and Oropharynx as per pre-operative assessment

## 2017-03-29 NOTE — Anesthesia Post-op Follow-up Note (Signed)
Anesthesia QCDR form completed.        

## 2017-03-30 ENCOUNTER — Encounter: Payer: Self-pay | Admitting: Internal Medicine

## 2017-03-30 DIAGNOSIS — I70244 Atherosclerosis of native arteries of left leg with ulceration of heel and midfoot: Secondary | ICD-10-CM | POA: Diagnosis not present

## 2017-03-30 LAB — CBC
HEMATOCRIT: 36 % (ref 35.0–47.0)
HEMOGLOBIN: 12.4 g/dL (ref 12.0–16.0)
MCH: 29.7 pg (ref 26.0–34.0)
MCHC: 34.4 g/dL (ref 32.0–36.0)
MCV: 86.3 fL (ref 80.0–100.0)
Platelets: 226 10*3/uL (ref 150–440)
RBC: 4.17 MIL/uL (ref 3.80–5.20)
RDW: 14.3 % (ref 11.5–14.5)
WBC: 7.7 10*3/uL (ref 3.6–11.0)

## 2017-03-30 LAB — BASIC METABOLIC PANEL
ANION GAP: 5 (ref 5–15)
BUN: 17 mg/dL (ref 6–20)
CHLORIDE: 106 mmol/L (ref 101–111)
CO2: 26 mmol/L (ref 22–32)
Calcium: 8.7 mg/dL — ABNORMAL LOW (ref 8.9–10.3)
Creatinine, Ser: 0.59 mg/dL (ref 0.44–1.00)
GFR calc Af Amer: >60 mL/min (ref >60)
GFR calc non Af Amer: >60 mL/min (ref >60)
GLUCOSE: 313 mg/dL — AB (ref 65–99)
POTASSIUM: 4.4 mmol/L (ref 3.5–5.1)
Sodium: 137 mmol/L (ref 135–145)

## 2017-03-30 LAB — PROTIME-INR
INR: 1.11
Prothrombin Time: 14.4 seconds (ref 11.4–15.2)

## 2017-03-30 LAB — GLUCOSE, CAPILLARY
GLUCOSE-CAPILLARY: 140 mg/dL — AB (ref 65–99)
GLUCOSE-CAPILLARY: 226 mg/dL — AB (ref 65–99)
Glucose-Capillary: 217 mg/dL — ABNORMAL HIGH (ref 65–99)

## 2017-03-30 LAB — HEMOGLOBIN A1C
Hgb A1c MFr Bld: 7.8 % — ABNORMAL HIGH (ref 4.8–5.6)
MEAN PLASMA GLUCOSE: 177.16 mg/dL

## 2017-03-30 MED ORDER — GLIPIZIDE ER 10 MG PO TB24
10.0000 mg | ORAL_TABLET | Freq: Every day | ORAL | Status: DC
  Administered 2017-03-30: 10 mg via ORAL
  Filled 2017-03-30: qty 1

## 2017-03-30 MED ORDER — INSULIN ASPART 100 UNIT/ML ~~LOC~~ SOLN
0.0000 [IU] | Freq: Three times a day (TID) | SUBCUTANEOUS | Status: DC
  Administered 2017-03-30 (×2): 5 [IU] via SUBCUTANEOUS
  Filled 2017-03-30 (×2): qty 1

## 2017-03-30 MED ORDER — INSULIN ASPART 100 UNIT/ML ~~LOC~~ SOLN: 0.0000 [IU] | Freq: Every day | SUBCUTANEOUS | Status: DC

## 2017-03-30 MED ORDER — ISOSORBIDE MONONITRATE ER 30 MG PO TB24: 30.0000 mg | ORAL_TABLET | Freq: Two times a day (BID) | ORAL | Status: DC

## 2017-03-30 MED ORDER — ENOXAPARIN SODIUM 120 MG/0.8ML ~~LOC~~ SOLN: 1.5000 mg/kg | SUBCUTANEOUS | 1 refills | Status: DC

## 2017-03-30 MED ORDER — ENOXAPARIN SODIUM 120 MG/0.8ML ~~LOC~~ SOLN: 1.5000 mg/kg | Freq: Once | SUBCUTANEOUS | 0 refills | Status: DC

## 2017-03-30 MED ORDER — ENOXAPARIN SODIUM 120 MG/0.8ML ~~LOC~~ SOLN
1.5000 mg/kg | Freq: Once | SUBCUTANEOUS | Status: AC
  Administered 2017-03-30: 120 mg via SUBCUTANEOUS
  Filled 2017-03-30: qty 0.8

## 2017-03-30 MED ORDER — HYDROCODONE-ACETAMINOPHEN 5-325 MG PO TABS: 1.0000 | ORAL_TABLET | ORAL | 0 refills | Status: DC | PRN

## 2017-03-30 MED ORDER — ENOXAPARIN SODIUM 120 MG/0.8ML ~~LOC~~ SOLN: 1.0000 mg/kg | SUBCUTANEOUS | 1 refills | Status: DC

## 2017-03-30 NOTE — Discharge Instructions (Signed)
Resume your warfarin tonight at 7.5mg  daily. Follow up with anticoagulation clinic in 3-4 days for a check of your INR.

## 2017-03-30 NOTE — Progress Notes (Signed)
Patient is discharge home in a stable condition , right groin site C/D/I , no bleeding /hematoma noted , site care given with summary and f/u care , pt going home on 4 days Lovenox sq, teaching given verbalized understanding .

## 2017-03-30 NOTE — Progress Notes (Signed)
Spoke with patient about lovenox injections, restarting warfarin and follow up. Pt was provided information/handouts about proper disposal of sharps and how to inject lovenox.  Pt has injected insulin in the past so she is familiar with injection technique. Instructed patient to resume her warfarin tonight at normal dose- 7.5mg  daily. States that she has been on this dose for a while and has been therapeutic. Patient will call anticoagulation clinic at [facility] tomorrow to confirm her follow up appointment.  Informed her she will need to have her INR checked in 3-4 days Explained that she is to take both the warfarin and lovenox. Lovenox is to keep her blood thin while the warfarin gets to therapeutic levels and her INR reaches goal.  Olene Floss, Pharm.D, BCPS Clinical Pharmacist

## 2017-03-30 NOTE — Consult Note (Signed)
[facility] - [place] [facility]   PATIENT NAME: [name]    MR#:  [id number]  DATE OF BIRTH:  [birth year]  DATE OF ADMISSION:  03/29/2017  PRIMARY CARE PHYSICIAN: Marguarite Arbour, MD   REQUESTING/REFERRING PHYSICIAN: Dr Jilda Panda  CHIEF COMPLAINT:  Diabetes management  HISTORY OF PRESENT ILLNESS:  [name]  is a 66 y.o. female with a known history of Diabetes and coronary artery disease. She came in for an elective angiogram of her lower extremity for a long-standing callused ulcer on her left foot. She had an angioplasty yesterday. No pain in the foot. She feels okay. She is hoping to go home today. Her sugars were up this morning and hospitalist services were contacted for consultation.  PAST MEDICAL HISTORY:   Past Medical History:  Diagnosis Date  . Acute on chronic diastolic CHF (congestive heart failure) (HCC) 01/24/2015  . Anemia   . Anxiety   . Atrial fibrillation (HCC)   . Complication of anesthesia    difficult to induce sleep  . Coronary artery disease    Status post coronary stenting approximately 10 years ago  . Depression   . Hyperlipidemia   . Hypertension   . Peripheral artery disease (HCC)   . Pulmonary embolism (HCC)   . Type 2 diabetes mellitus (HCC)     PAST SURGICAL HISTOIRY:   Past Surgical History:  Procedure Laterality Date  . ABDOMINAL AORTOGRAM W/LOWER EXTREMITY N/A 12/14/2016   Procedure: Abdominal Aortogram w/Lower Extremity;  Surgeon: Renford Dills, MD;  Location: [facility] INVASIVE CV LAB;  Service: Cardiovascular;  Laterality: N/A;  . APPENDECTOMY    . CARDIAC CATHETERIZATION N/A 01/03/2015   Procedure: Left Heart Cath;  Surgeon: Antonieta Iba, MD;  Location: [facility];  Service: Cardiovascular;  Laterality: N/A;  . CESAREAN SECTION    . CORONARY ARTERY BYPASS GRAFT N/A 01/07/2015   Procedure: CORONARY ARTERY BYPASS GRAFTING (CABG) x4 using bilateral greater saphenous vein and left internal  mammary artery.;  Surgeon: Kerin Perna, MD;  Location: [facility] OR;  Service: Open Heart Surgery;  Laterality: N/A;  . facial fractures    . LOWER EXTREMITY ANGIOGRAPHY Left 12/14/2016   Procedure: Lower Extremity Angiography;  Surgeon: Renford Dills, MD;  Location: [facility] INV[facility];  Service: Cardiovascular;  Laterality: Left;  . LOWER EXTREMITY INTERVENTION  12/14/2016   Procedure: Lower Extremity Intervention;  Surgeon: Renford Dills, MD;  Location: [facility] CV LAB;  Service: Cardiovascular;;  . Lt wrist fracture Left   . PERIPHERAL VASCULAR CATHETERIZATION N/A 10/21/2015   Procedure: Abdominal Aortogram w/Lower Extremity;  Surgeon: Renford Dills, MD;  Location: [facility];  Service: Cardiovascular;  Laterality: N/A;  . PERIPHERAL VASCULAR CATHETERIZATION  10/21/2015   Procedure: Lower Extremity Intervention;  Surgeon: Renford Dills, MD;  Location: [facility];  Service: Cardiovascular;;  . PERIPHERAL VASCULAR CATHETERIZATION Left 11/11/2015   Procedure: Renal Angiography;  Surgeon: Renford Dills, MD;  Location: [facility];  Service: Cardiovascular;  Laterality: Left;  . PERIPHERAL VASCULAR CATHETERIZATION Right 11/11/2015   Procedure: Lower Extremity Angiography;  Surgeon: Renford Dills, MD;  Location: [facility];  Service: Cardiovascular;  Laterality: Right;  . PERIPHERAL VASCULAR CATHETERIZATION  11/11/2015   Procedure: Lower Extremity Intervention;  Surgeon: Renford Dills, MD;  Location: [facility];  Service: Cardiovascular;;  . PERIPHERAL VASCULAR CATHETERIZATION  11/11/2015   Procedure: Renal Intervention;  Surgeon: Earl Lites  Wynonia Lawman, MD;  Location: [facility];  Service: Cardiovascular;;  . PERIPHERAL VASCULAR CATHETERIZATION Right 11/25/2015   Procedure: Lower Extremity Angiography;  Surgeon: Renford Dills, MD;  Location: [facility];  Service: Cardiovascular;  Laterality: Right;  . PERIPHERAL  VASCULAR CATHETERIZATION  11/25/2015   Procedure: Lower Extremity Intervention;  Surgeon: Renford Dills, MD;  Location: [facility];  Service: Cardiovascular;;  . PERIPHERAL VASCULAR CATHETERIZATION Left 12/17/2015   Procedure: Lower Extremity Angiography;  Surgeon: Renford Dills, MD;  Location: [facility];  Service: Cardiovascular;  Laterality: Left;  . PERIPHERAL VASCULAR CATHETERIZATION  12/17/2015   Procedure: Lower Extremity Intervention;  Surgeon: Renford Dills, MD;  Location: [facility];  Service: Cardiovascular;;  . PERIPHERAL VASCULAR CATHETERIZATION Left 05/11/2016   Procedure: Lower Extremity Angiography;  Surgeon: Renford Dills, MD;  Location: [facility];  Service: Cardiovascular;  Laterality: Left;  . PERIPHERAL VASCULAR CATHETERIZATION  05/11/2016   Procedure: Lower Extremity Intervention;  Surgeon: Renford Dills, MD;  Location: [facility];  Service: Cardiovascular;;  . TONSILLECTOMY AND ADENOIDECTOMY    . TRANSLUMINAL ANGIOPLASTY  01/30/2013   L posterior tibial artery, L tibioperoneal trunk, L SFA  . TRANSLUMINAL ATHERECTOMY TIBIAL ARTERY  01/30/2013    SOCIAL HISTORY:   Social History  Substance Use Topics  . Smoking status: Never Smoker  . Smokeless tobacco: Never Used  . Alcohol use No    FAMILY HISTORY:   Family History  Problem Relation Age of Onset  . Hypertension Mother   . CAD Mother   . Transient ischemic attack Mother   . Cancer Father     DRUG ALLERGIES:   Allergies  Allergen Reactions  . Oxycodone Hcl Other (See Comments)    Palpitations, fainted, stopped breathing  . Metformin And Related Diarrhea  . Biaxin [Clarithromycin] Itching  . Atorvastatin Cough  . Codeine Nausea Only and Other (See Comments)    Constipates; doesn't like the way it makes her feel     REVIEW OF SYSTEMS:  CONSTITUTIONAL: No fever, fatigue or weakness. Positive for weight loss EYES: No blurred or double  vision.  EARS, NOSE, AND THROAT: No tinnitus or ear pain.  RESPIRATORY: No cough, shortness of breath, wheezing or hemoptysis.  CARDIOVASCULAR: No chest pain, orthopnea, edema.  GASTROINTESTINAL: No nausea, vomiting, diarrhea or abdominal pain.  GENITOURINARY: No dysuria, hematuria.  ENDOCRINE: No polyuria, nocturia,  HEMATOLOGY: No anemia, easy bruising or bleeding SKIN: Callused ulceration left foot MUSCULOSKELETAL: No joint pain or arthritis.   NEUROLOGIC: No tingling, numbness, weakness.  PSYCHIATRY: No anxiety or depression.   MEDICATIONS AT HOME:   Prior to Admission medications   Medication Sig Start Date End Date Taking? Authorizing Provider  aspirin 81 MG EC tablet Take 1 tablet (81 mg total) by mouth daily. 12/20/15  Yes Stegmayer, Cala Bradford A, PA-C  atenolol (TENORMIN) 100 MG tablet Take 0.5 tablets (50 mg total) by mouth daily. Patient taking differently: Take 100 mg by mouth daily.  12/20/15  Yes Stegmayer, Cala Bradford A, PA-C  furosemide (LASIX) 20 MG tablet Take 1 tablet (20 mg total) by mouth daily as needed. Patient taking differently: Take 20 mg by mouth daily as needed for fluid or edema.  12/20/15  Yes Stegmayer, Ranae Plumber, PA-C  GLIPIZIDE XL 10 MG 24 hr tablet Take 10 mg by mouth daily with breakfast.  03/10/17  Yes [provider]  guaiFENesin (MUCINEX) 600 MG 12 hr tablet  Take 1 tablet (600 mg total) by mouth daily. Takes 1 tab daily, can take addt'l tab as needed for mucus Patient taking differently: Take 600 mg by mouth 2 (two) times daily.  12/20/15  Yes Stegmayer, Ranae Plumber, PA-C  nitroGLYCERIN (NITROSTAT) 0.4 MG SL tablet Place 1 tablet (0.4 mg total) under the tongue every 5 (five) minutes as needed for chest pain. 04/21/16 03/28/17 Yes Gollan, Tollie Pizza, MD  potassium chloride (K-DUR) 10 MEQ tablet Take 1 tablet (10 mEq total) by mouth 2 (two) times daily as needed. Patient taking differently: Take 10 mEq by mouth 2 (two) times daily as needed (severe cramps).   12/20/15  Yes Stegmayer, Cala Bradford A, PA-C  sitaGLIPtin (JANUVIA) 100 MG tablet Take 100 mg by mouth daily.     Yes [provider]  warfarin (COUMADIN) 5 MG tablet Take as directed by [facility] Patient taking differently: Take 7.5 mg by mouth daily. Take as directed by Coumadin Clinic 12/10/16  Yes Gollan, Tollie Pizza, MD  zolpidem (AMBIEN) 10 MG tablet Take 10 mg by mouth at bedtime.    Yes [provider]  isosorbide mononitrate (IMDUR) 30 MG 24 hr tablet Take 1 tablet (30 mg total) by mouth 2 (two) times daily. Patient not taking: Reported on 03/24/2017 12/20/15   Stegmayer, Cala Bradford A, PA-C      VITAL SIGNS:  Blood pressure (!) 164/56, pulse 61, temperature 98.6 F (37 C), temperature source Oral, resp. rate 18, height 5\' 8"  (1.727 m), weight 79.4 kg (175 lb), SpO2 100 %.  PHYSICAL EXAMINATION:  GENERAL:  66 y.o.-year-old patient lying in the bed with no acute distress.  EYES: Pupils equal, round, reactive to light and accommodation. No scleral icterus. Extraocular muscles intact.  HEENT: Head atraumatic, normocephalic. Oropharynx and nasopharynx clear.  NECK:  Supple, no jugular venous distention. No thyroid enlargement, no tenderness.  LUNGS: Normal breath sounds bilaterally, no wheezing, rales,rhonchi or crepitation. No use of accessory muscles of respiration.  CARDIOVASCULAR: S1, S2 normal. No murmurs, rubs, or gallops.  ABDOMEN: Soft, nontender, nondistended. Bowel sounds present. No organomegaly or mass.  EXTREMITIES: No pedal edema, cyanosis, or clubbing.  NEUROLOGIC: Cranial nerves II through XII are intact. Muscle strength 5/5 in all extremities. Sensation intact. Gait not checked.  PSYCHIATRIC: The patient is alert and oriented x 3.  SKIN: Small left foot ulceration  LABORATORY PANEL:   CBC  Recent Labs Lab 03/30/17 0633  WBC 7.7  HGB 12.4  HCT 36.0  PLT 226    ------------------------------------------------------------------------------------------------------------------  Chemistries   Recent Labs Lab 03/30/17 0633  NA 137  K 4.4  CL 106  CO2 26  GLUCOSE 313*  BUN 17  CREATININE 0.59  CALCIUM 8.7*   ------------------------------------------------------------------------------------------------------------------   RADIOLOGY:  Dg Chest 2 View  Result Date: 03/28/2017 CLINICAL DATA:  Basking surgery.  Preoperative chest x-ray. EXAM: CHEST  2 VIEW COMPARISON:  CT chest 07/07/2016.  Chest x-ray 01/10/2015. FINDINGS: Mediastinum hilar structures normal. Lungs are clear. No pleural effusion or pneumothorax . Prior CABG. Stable cardiomegaly. IMPRESSION: 1.  Prior CABG.  Stable cardiomegaly. 2. No acute pulmonary disease. Electronically Signed   By: Maisie Fus  Register   On: 03/28/2017 16:14      IMPRESSION AND PLAN:   1. Type 2 diabetes mellitus. Patient states that her diabetes has been well controlled on her Januvia and glipizide. I'll restart her glipizide. Can restart Januvia as outpatient. Put on sliding scale for right now. Patient stable for discharge whenever Dr. [name] decides  to send her home. 2. Atrial fibrillation on warfarin at home. Suggest Lovenox injections to bridge until INR comes up with her Coumadin. 3. Peripheral vascular disease status post angioplasty 4. History of coronary artery disease on aspirin, atenolol 5. History of diastolic congestive heart failure. Currently no signs. 6. Insomnia on Ambien   All the records are reviewed and case discussed with Consulting provider. Management plans discussed with the patient, family and they are in agreement.  CODE STATUS: Full code  TOTAL TIME TAKING CARE OF THIS PATIENT: 50 minutes.    Alford Highland M.D on 03/30/2017 at 8:04 AM  Between 7am to 6pm - Pager - [phone]  After 6pm go to [email or web address] - password [email or web address]  [facility]  Office   [phone]  CC: Primary care Physician: Marguarite Arbour, MD

## 2017-03-30 NOTE — Progress Notes (Signed)
Chaplain received a page for a pt in [place] who requested for a Bible. CH met with pt and husband at bedside. Pt talked about her health struggles, interpersonal relationship; talked about her daughter and son, and other family issues she is dealing with. Pt requested for prayers for herself and her daughter in particular. [place] gave pt a bible, offered a spiritual support, read a sacred text from [place] 5:14-20, and prayed for pt and her family. Pt states she will be discharged today.   03/30/17 1400  Clinical Encounter Type  Visited With Patient and family together  Visit Type Initial;Spiritual support  Referral From Nurse  Consult/Referral To [place] text;Prayer

## 2017-03-30 NOTE — Anesthesia Postprocedure Evaluation (Signed)
Anesthesia Post Note  Patient: [name]  Procedure(s) Performed: Procedure(s) (LRB): Lower Extremity Angiography (Left)  Patient location during evaluation: PACU Anesthesia Type: General Level of consciousness: awake and alert Pain management: pain level controlled Vital Signs Assessment: post-procedure vital signs reviewed and stable Respiratory status: spontaneous breathing, nonlabored ventilation, respiratory function stable and patient connected to nasal cannula oxygen Cardiovascular status: blood pressure returned to baseline and stable Postop Assessment: no signs of nausea or vomiting Anesthetic complications: no     Last Vitals:  Vitals:   03/29/17 1950 03/29/17 2312  BP: (!) 188/72 (!) 142/59  Pulse: 67 (!) 57  Resp: 16   Temp: 36.6 C   SpO2: 99%     Last Pain:  Vitals:   03/29/17 2358  TempSrc:   PainSc: Asleep                 Cleda Mccreedy Piscitello

## 2017-03-30 NOTE — Progress Notes (Deleted)
MRN : [id number]  [name] is a 66 y.o. (01-14-1951) female who presents with chief complaint of No chief complaint on file. Marland Kitchen  History of Present Illness: Recommend:  The patient is status post successful angiogram with intervention.    Procedure was on 03/29/2017:             1. Introduction catheter into left lower extremity 3rd order catheter placement with additional third order catheter placement 2. Contrast injection left lower extremity for distal runoff   3. Combination Crosser atherectomy and CSI atherectomy of the left posterior tibial artery 4. Percutaneous transluminal angioplasty left posterior tibial artery with 2.0 through 3.0 diameter Ultraverse balloon             5.   Combination Crosser atherectomy and CSI atherectomy of the left anterior tibial artery                    6.   Percutaneous transluminal angioplasty of the left anterior tibial artery with 2.0 through 3.0 diameter Ultraverse balloon               7.  Star close closure right common femoral arteriotomy1. Introduction catheter into left lower extremity 3rd order catheter placement with additional third order catheter placement 2. Contrast injection left lower extremity for distal runoff   3. Combination Crosser atherectomy and CSI atherectomy of the left posterior tibial artery 4. Percutaneous transluminal angioplasty left posterior tibial artery with 2.0 through 3.0 diameter Ultraverse balloon             5.   Combination Crosser atherectomy and CSI atherectomy of the left anterior tibial artery                    6.   Percutaneous transluminal angioplasty of the left anterior tibial artery with 2.0 through 3.0 diameter Ultraverse balloon               7.  Star close closure right common femoral arteriotomy  The patient reports that the claudication symptoms and leg pain is essentially gone.   The patient denies lifestyle  limiting changes at this point in time.  No further invasive studies, angiography or surgery at this time The patient should continue walking and begin a more formal exercise program.  The patient should continue antiplatelet therapy and aggressive treatment of the lipid abnormalities  Smoking cessation was again discussed  The patient should continue wearing graduated compression socks 10-15 mmHg strength to control the mild edema.  Patient should undergo noninvasive studies as ordered. The patient will follow up with me after the studies.    No outpatient prescriptions have been marked as taking for the 03/31/17 encounter (Appointment) with [name], Latina Craver, MD.    Past Medical History:  Diagnosis Date  . Acute on chronic diastolic CHF (congestive heart failure) (HCC) 01/24/2015  . Anemia   . Anxiety   . Atrial fibrillation (HCC)   . Complication of anesthesia    difficult to induce sleep  . Coronary artery disease    Status post coronary stenting approximately 10 years ago  . Depression   . Hyperlipidemia   . Hypertension   . Peripheral artery disease (HCC)   . Pulmonary embolism (HCC)   . Type 2 diabetes mellitus (HCC)     Past Surgical History:  Procedure Laterality Date  . ABDOMINAL AORTOGRAM W/LOWER EXTREMITY N/A 12/14/2016   Procedure: Abdominal Aortogram w/Lower Extremity;  Surgeon: [name],  Latina Craver, MD;  Location: [facility];  Service: Cardiovascular;  Laterality: N/A;  . APPENDECTOMY    . CARDIAC CATHETERIZATION N/A 01/03/2015   Procedure: Left Heart Cath;  Surgeon: Antonieta Iba, MD;  Location: [facility];  Service: Cardiovascular;  Laterality: N/A;  . CESAREAN SECTION    . CORONARY ARTERY BYPASS GRAFT N/A 01/07/2015   Procedure: CORONARY ARTERY BYPASS GRAFTING (CABG) x4 using bilateral greater saphenous vein and left internal mammary artery.;  Surgeon: Kerin Perna, MD;  Location: [facility];  Service: Open Heart Surgery;  Laterality: N/A;    . facial fractures    . LOWER EXTREMITY ANGIOGRAPHY Left 12/14/2016   Procedure: Lower Extremity Angiography;  Surgeon: Renford Dills, MD;  Location: [facility];  Service: Cardiovascular;  Laterality: Left;  . LOWER EXTREMITY ANGIOGRAPHY Left 03/29/2017   Procedure: Lower Extremity Angiography;  Surgeon: Renford Dills, MD;  Location: [facility];  Service: Cardiovascular;  Laterality: Left;  . LOWER EXTREMITY INTERVENTION  12/14/2016   Procedure: Lower Extremity Intervention;  Surgeon: Renford Dills, MD;  Location: [facility];  Service: Cardiovascular;;  . Lt wrist fracture Left   . PERIPHERAL VASCULAR CATHETERIZATION N/A 10/21/2015   Procedure: Abdominal Aortogram w/Lower Extremity;  Surgeon: Renford Dills, MD;  Location: [facility];  Service: Cardiovascular;  Laterality: N/A;  . PERIPHERAL VASCULAR CATHETERIZATION  10/21/2015   Procedure: Lower Extremity Intervention;  Surgeon: Renford Dills, MD;  Location: [facility];  Service: Cardiovascular;;  . PERIPHERAL VASCULAR CATHETERIZATION Left 11/11/2015   Procedure: Renal Angiography;  Surgeon: Renford Dills, MD;  Location: [facility];  Service: Cardiovascular;  Laterality: Left;  . PERIPHERAL VASCULAR CATHETERIZATION Right 11/11/2015   Procedure: Lower Extremity Angiography;  Surgeon: Renford Dills, MD;  Location: [facility];  Service: Cardiovascular;  Laterality: Right;  . PERIPHERAL VASCULAR CATHETERIZATION  11/11/2015   Procedure: Lower Extremity Intervention;  Surgeon: Renford Dills, MD;  Location: [facility];  Service: Cardiovascular;;  . PERIPHERAL VASCULAR CATHETERIZATION  11/11/2015   Procedure: Renal Intervention;  Surgeon: Renford Dills, MD;  Location: [facility];  Service: Cardiovascular;;  . PERIPHERAL VASCULAR CATHETERIZATION Right 11/25/2015   Procedure: Lower Extremity Angiography;  Surgeon: Renford Dills, MD;   Location: [facility];  Service: Cardiovascular;  Laterality: Right;  . PERIPHERAL VASCULAR CATHETERIZATION  11/25/2015   Procedure: Lower Extremity Intervention;  Surgeon: Renford Dills, MD;  Location: [facility];  Service: Cardiovascular;;  . PERIPHERAL VASCULAR CATHETERIZATION Left 12/17/2015   Procedure: Lower Extremity Angiography;  Surgeon: Renford Dills, MD;  Location: [facility];  Service: Cardiovascular;  Laterality: Left;  . PERIPHERAL VASCULAR CATHETERIZATION  12/17/2015   Procedure: Lower Extremity Intervention;  Surgeon: Renford Dills, MD;  Location: [facility];  Service: Cardiovascular;;  . PERIPHERAL VASCULAR CATHETERIZATION Left 05/11/2016   Procedure: Lower Extremity Angiography;  Surgeon: Renford Dills, MD;  Location: [facility];  Service: Cardiovascular;  Laterality: Left;  . PERIPHERAL VASCULAR CATHETERIZATION  05/11/2016   Procedure: Lower Extremity Intervention;  Surgeon: Renford Dills, MD;  Location: [facility];  Service: Cardiovascular;;  . TONSILLECTOMY AND ADENOIDECTOMY    . TRANSLUMINAL ANGIOPLASTY  01/30/2013   L posterior tibial artery, L tibioperoneal trunk, L SFA  . TRANSLUMINAL ATHERECTOMY TIBIAL ARTERY  01/30/2013    Social History Social History  Substance Use Topics  . Smoking status: Never Smoker  . Smokeless tobacco: Never Used  . Alcohol use No    Family History Family History  Problem Relation Age of Onset  . Hypertension Mother   . CAD Mother   . Transient ischemic attack Mother   . Cancer Father     Allergies  Allergen Reactions  . Oxycodone Hcl Other (See Comments)    Palpitations, fainted, stopped breathing  . Metformin And Related Diarrhea  . Biaxin [Clarithromycin] Itching  . Atorvastatin Cough  . Codeine Nausea Only and Other (See Comments)    Constipates; doesn't like the way it makes her feel      REVIEW OF SYSTEMS (Negative unless  checked)  Constitutional: [] Weight loss  [] Fever  [] Chills Cardiac: [] Chest pain   [] Chest pressure   [] Palpitations   [] Shortness of breath when laying flat   [] Shortness of breath with exertion. Vascular:  [] Pain in legs with walking   [] Pain in legs at rest  [] History of DVT   [] Phlebitis   [] Swelling in legs   [] Varicose veins   [] Non-healing ulcers Pulmonary:   [] Uses home oxygen   [] Productive cough   [] Hemoptysis   [] Wheeze  [] COPD   [] Asthma Neurologic:  [] Dizziness   [] Seizures   [] History of stroke   [] History of TIA  [] Aphasia   [] Vissual changes   [] Weakness or numbness in arm   [] Weakness or numbness in leg Musculoskeletal:   [] Joint swelling   [] Joint pain   [] Low back pain Hematologic:  [] Easy bruising  [] Easy bleeding   [] Hypercoagulable state   [] Anemic Gastrointestinal:  [] Diarrhea   [] Vomiting  [] Gastroesophageal reflux/heartburn   [] Difficulty swallowing. Genitourinary:  [] Chronic kidney disease   [] Difficult urination  [] Frequent urination   [] Blood in urine Skin:  [] Rashes   [] Ulcers  Psychological:  [] History of anxiety   []  History of major depression.  Physical Examination  There were no vitals filed for this visit. There is no height or weight on file to calculate BMI. Gen: WD/WN, NAD Head: [place]/AT, No temporalis wasting.  Ear/Nose/Throat: Hearing grossly intact, nares w/o erythema or drainage Eyes: PER, EOMI, sclera nonicteric.  Neck: Supple, no large masses.   Pulmonary:  Good air movement, no audible wheezing bilaterally, no use of accessory muscles.  Cardiac: RRR, no JVD Vascular: *** Vessel Right Left  Radial Palpable Palpable  Ulnar Palpable Palpable  Brachial Palpable Palpable  Carotid Palpable Palpable  Femoral Palpable Palpable  Popliteal Palpable Palpable  PT Palpable Palpable  DP Palpable Palpable  Gastrointestinal: Non-distended. No guarding/no peritoneal signs.  Musculoskeletal: M/S 5/5 throughout.  No deformity or atrophy.  Neurologic: CN  2-12 intact. Symmetrical.  Speech is fluent. Motor exam as listed above. Psychiatric: Judgment intact, Mood & affect appropriate for pt's clinical situation. Dermatologic: No rashes or ulcers noted.  No changes consistent with cellulitis. Lymph : No lichenification or skin changes of chronic lymphedema.  CBC Lab Results  Component Value Date   WBC 7.7 03/30/2017   HGB 12.4 03/30/2017   HCT 36.0 03/30/2017   MCV 86.3 03/30/2017   PLT 226 03/30/2017    BMET    Component Value Date/Time   NA 137 03/30/2017 0633   NA 136 09/26/2013 0928   K 4.4 03/30/2017 0633   K 4.5 09/26/2013 0928   CL 106 03/30/2017 0633   CL 104 09/26/2013 0928   CO2 26 03/30/2017 0633   CO2 31 09/26/2013 0928   GLUCOSE 313 (H) 03/30/2017 6834  GLUCOSE 145 (H) 09/26/2013 0928   BUN 17 03/30/2017 0633   BUN 19 (H) 09/26/2013 0928   CREATININE 0.59 03/30/2017 0633   CREATININE 0.85 09/26/2013 0928   CALCIUM 8.7 (L) 03/30/2017 0633   CALCIUM 9.8 09/26/2013 0928   GFRNONAA >60 03/30/2017 0633   GFRNONAA >60 09/26/2013 0928   GFRAA >60 03/30/2017 0633   GFRAA >60 09/26/2013 0928   Estimated Creatinine Clearance: 77.6 mL/min (by C-G formula based on SCr of 0.59 mg/dL).  COAG Lab Results  Component Value Date   INR 1.11 03/30/2017   INR 1.22 03/28/2017   INR 3.7 03/21/2017    Radiology Dg Chest 2 View  Result Date: 03/28/2017 CLINICAL DATA:  [facility]asking surgery.  Preoperative chest x-ray. EXAM: CHEST  2 VIEW COMPARISON:  CT chest 07/07/2016.  Chest x-ray 01/10/2015. FINDINGS: Mediastinum hilar structures normal. Lungs are clear. No pleural effusion or pneumothorax . Prior CABG. Stable cardiomegaly. IMPRESSION: 1.  Prior CABG.  Stable cardiomegaly. 2. No acute pulmonary disease. Electronically Signed   By: Maisie Fus  Register   On: 03/28/2017 16:14    Outside Studies/Documentation *** pages of outside documents were reviewed.  They showed ***.  Assessment/Plan 1. Atherosclerosis of artery of extremity  with ulceration (HCC) ***  2. Coronary artery disease involving coronary bypass graft of native heart without angina pectoris ***  3. Essential hypertension ***  4. Type 2 diabetes mellitus with other circulatory complication, without long-term current use of insulin (HCC) ***  5. Mixed hyperlipidemia ***    [name], MD  03/30/2017 8:56 PM

## 2017-03-30 NOTE — Care Management (Addendum)
For discharge today on lovenox injections. Called to [facility] pharmacy 80mg  sq qd #4 with one refill.  Notified attending that pharmacy had 80mg  on hand so does not have to use the 120mg  syringes.  MD stated that He did not intend to order 80mg .  Intended for the prescription to read for 120mg  SQ QD x 4 days... 1 refill.  Copay  is approximately 11-15 dollars.  Patient is able to self administer injections.  Inquired about protimes.  Informed patient has appt with cardiology next week and protime can be assessed at that time.

## 2017-03-31 ENCOUNTER — Telehealth: Payer: Self-pay | Admitting: Cardiovascular Disease

## 2017-03-31 ENCOUNTER — Ambulatory Visit (INDEPENDENT_AMBULATORY_CARE_PROVIDER_SITE_OTHER): Payer: Medicare Other | Admitting: Vascular Surgery

## 2017-03-31 NOTE — Telephone Encounter (Signed)
Spoke with pt- she states she started her warfarin yesterday and Lovenox 120mg  once daily (1.5mg /kg once daily) x4 days per MD. States she feels a little lightheaded and wobbly today. No s/sx of stroke or PE. Advised pt to call Dr Marijean Heath office and if her symptoms worsen, to go to the ED. Pt verbalized understanding.

## 2017-03-31 NOTE — Telephone Encounter (Signed)
Pt states she had a procedure done on 8/21 angioplasty by Dr. [name]. States she was placed on Lovenox. She is not comfortable giving herself the injections. She is alos unsure of starting back on coumadin. States she has been dizzy and "stumbly",  Also lightheaded  this morning.

## 2017-03-31 NOTE — Telephone Encounter (Signed)
Pt had angiography, not angioplasty

## 2017-04-01 DIAGNOSIS — I70248 Atherosclerosis of native arteries of left leg with ulceration of other part of lower left leg: Secondary | ICD-10-CM

## 2017-04-01 NOTE — Discharge Summary (Signed)
[facility]    Discharge Summary    Patient ID:  [name] MRN: [id number] DOB/AGE: 03/17/1951 66 y.o.  Admit date: 03/29/2017 Discharge date: 04/01/2017 Date of Surgery: 03/29/2017 Surgeon: Surgeon(s): Schnier, Latina Craver, MD  Admission Diagnosis: Atherosclerosis of artery of extremity with ulceration (HCC) [I70.299, L97.909]  Discharge Diagnoses:  Atherosclerosis of artery of extremity with ulceration (HCC) [I70.299, L97.909]  Secondary Diagnoses: Past Medical History:  Diagnosis Date  . Acute on chronic diastolic CHF (congestive heart failure) (HCC) 01/24/2015  . Anemia   . Anxiety   . Atrial fibrillation (HCC)   . Complication of anesthesia    difficult to induce sleep  . Coronary artery disease    Status post coronary stenting approximately 10 years ago  . Depression   . Hyperlipidemia   . Hypertension   . Peripheral artery disease (HCC)   . Pulmonary embolism (HCC)   . Type 2 diabetes mellitus (HCC)     Procedure(s): Lower Extremity Angiography  Discharged Condition: good  HPI:  On the day of admission the patient underwent successful angiography and intervention with revascularization of the left lower extremity.  Postprocedure she was maintained on an Aggrastat drip overnight. On postoperative day 1 she had palpable pedal pulses and was felt fit for discharge. She is discharged home on aspirin and Coumadin with Lovenox bridge.  Hospital Course:  [name] is a 66 y.o. female is S/P Left Procedure(s): Lower Extremity Angiography Extubated: POD # 0 Physical exam: Palpable left posterior tibial and dorsalis pedis pulses Post-op wounds clean, dry, intact or healing well Pt. Ambulating, voiding and taking PO diet without difficulty. Pt pain controlled with PO pain meds. Labs as below Complications:none  Consults:  Treatment Team:  Alford Highland, MD  Significant Diagnostic Studies: CBC Lab Results  Component  Value Date   WBC 7.7 03/30/2017   HGB 12.4 03/30/2017   HCT 36.0 03/30/2017   MCV 86.3 03/30/2017   PLT 226 03/30/2017    BMET    Component Value Date/Time   NA 137 03/30/2017 0633   NA 136 09/26/2013 0928   K 4.4 03/30/2017 0633   K 4.5 09/26/2013 0928   CL 106 03/30/2017 0633   CL 104 09/26/2013 0928   CO2 26 03/30/2017 0633   CO2 31 09/26/2013 0928   GLUCOSE 313 (H) 03/30/2017 0633   GLUCOSE 145 (H) 09/26/2013 0928   BUN 17 03/30/2017 0633   BUN 19 (H) 09/26/2013 0928   CREATININE 0.59 03/30/2017 0633   CREATININE 0.85 09/26/2013 0928   CALCIUM 8.7 (L) 03/30/2017 0633   CALCIUM 9.8 09/26/2013 0928   GFRNONAA >60 03/30/2017 0633   GFRNONAA >60 09/26/2013 0928   GFRAA >60 03/30/2017 0633   GFRAA >60 09/26/2013 0928   COAG Lab Results  Component Value Date   INR 1.11 03/30/2017   INR 1.22 03/28/2017   INR 3.7 03/21/2017     Disposition:  Discharge to :Home Discharge Instructions    Call MD for:  redness, tenderness, or signs of infection (pain, swelling, bleeding, redness, odor or green/yellow discharge around incision site)    Complete by:  As directed    Call MD for:  severe or increased pain, loss or decreased feeling  in affected limb(s)    Complete by:  As directed    Call MD for:  temperature >100.5    Complete by:  As directed    Driving Restrictions    Complete by:  As directed  No driving for 24 hours   Resume previous diet    Complete by:  As directed      Allergies as of 03/30/2017      Reactions   Oxycodone Hcl Other (See Comments)   Palpitations, fainted, stopped breathing   Metformin And Related Diarrhea   Biaxin [clarithromycin] Itching   Atorvastatin Cough   Codeine Nausea Only, Other (See Comments)   Constipates; doesn't like the way it makes her feel      Medication List    TAKE these medications   AMBIEN 10 MG tablet Generic drug:  zolpidem Take 10 mg by mouth at bedtime.   aspirin 81 MG EC tablet Take 1 tablet (81 mg  total) by mouth daily.   atenolol 100 MG tablet Commonly known as:  TENORMIN Take 0.5 tablets (50 mg total) by mouth daily. What changed:  how much to take   enoxaparin 120 MG/0.8ML injection Commonly known as:  LOVENOX Inject 0.79 mLs (120 mg total) into the skin daily.   furosemide 20 MG tablet Commonly known as:  LASIX Take 1 tablet (20 mg total) by mouth daily as needed. What changed:  reasons to take this   GLIPIZIDE XL 10 MG 24 hr tablet Generic drug:  glipiZIDE Take 10 mg by mouth daily with breakfast.   guaiFENesin 600 MG 12 hr tablet Commonly known as:  MUCINEX Take 1 tablet (600 mg total) by mouth daily. Takes 1 tab daily, can take addt'l tab as needed for mucus What changed:  when to take this  additional instructions   HYDROcodone-acetaminophen 5-325 MG tablet Commonly known as:  NORCO/VICODIN Take 1-2 tablets by mouth every 4 (four) hours as needed for moderate pain.   isosorbide mononitrate 30 MG 24 hr tablet Commonly known as:  IMDUR Take 1 tablet (30 mg total) by mouth 2 (two) times daily. Please check with Dr Judithann Sheen What changed:  additional instructions   nitroGLYCERIN 0.4 MG SL tablet Commonly known as:  NITROSTAT Place 1 tablet (0.4 mg total) under the tongue every 5 (five) minutes as needed for chest pain.   potassium chloride 10 MEQ tablet Commonly known as:  K-DUR Take 1 tablet (10 mEq total) by mouth 2 (two) times daily as needed. What changed:  reasons to take this   sitaGLIPtin 100 MG tablet Commonly known as:  JANUVIA Take 100 mg by mouth daily.   warfarin 5 MG tablet Commonly known as:  COUMADIN Take as directed by Coumad[facility] Clinic What changed:  how much to take  how to take this  when to take this  additional instructions            Discharge Care Instructions        Start     Ordered   03/30/17 0000  isosorbide mononitrate (IMDUR) 30 MG 24 hr tablet  2 times daily     03/30/17 1406   03/30/17 0000   HYDROcodone-acetaminophen (NORCO/VICODIN) 5-325 MG tablet  Every 4 hours PRN     03/30/17 1406   03/30/17 0000  Resume previous diet     03/30/17 1406   03/30/17 0000  Driving Restrictions    Comments:  No driving for 24 hours   70/48/88 1406   03/30/17 0000  Call MD for:  temperature >100.5     03/30/17 1406   03/30/17 0000  Call MD for:  redness, tenderness, or signs of infection (pain, swelling, bleeding, redness, odor or green/yellow discharge around incision site)  03/30/17 1406   03/30/17 0000  Call MD for:  severe or increased pain, loss or decreased feeling  in affected limb(s)     03/30/17 1406   03/30/17 0000  enoxaparin (LOVENOX) 120 MG/0.8ML injection  Every 24 hours     03/30/17 1443     Verbal and written Discharge instructions given to the patient. Wound care per Discharge AVS Follow-up Information    Schnier, Latina Craver, MD. Go on 03/31/2017.   Specialties:  Vascular Surgery, Cardiology, Radiology, Vascular Surgery Why:  with ABI, Appointment Time: 9:00am Contact information: [address] [place] [address] [phone]           Signed: [name], MD  04/01/2017, 4:38 PM

## 2017-04-04 ENCOUNTER — Telehealth (INDEPENDENT_AMBULATORY_CARE_PROVIDER_SITE_OTHER): Payer: Self-pay | Admitting: Vascular Surgery

## 2017-04-04 NOTE — Telephone Encounter (Signed)
PATIENT CALLED IN BECAUSE HER HOME HEALTH NURSE TOOK THE BANDAGE OFF AND SPRAYED ADHESIVE ON IT AND IT IS REALLY PAINFUL AND RAW AND SHE IS WORRIED BECAUSE SHE IS DIABETIC. WANTS TO KNOW IF THIS IS NORMAL AND IF SHE SHOULD AT LEAST COME IN AND HAVE THE NURSE CHECK IT OUT.

## 2017-04-05 ENCOUNTER — Encounter: Payer: Self-pay | Admitting: *Deleted

## 2017-04-05 NOTE — Telephone Encounter (Signed)
I spoke with JD and he advise at this time the patient does not have to come in the office.I inform the patient if she start having swelling,drainage,fever for her to call the office.

## 2017-04-06 ENCOUNTER — Encounter: Payer: Self-pay | Admitting: Anesthesiology

## 2017-04-06 ENCOUNTER — Ambulatory Visit (INDEPENDENT_AMBULATORY_CARE_PROVIDER_SITE_OTHER): Payer: Medicare Other

## 2017-04-06 DIAGNOSIS — I480 Paroxysmal atrial fibrillation: Secondary | ICD-10-CM

## 2017-04-06 DIAGNOSIS — Z5181 Encounter for therapeutic drug level monitoring: Secondary | ICD-10-CM | POA: Diagnosis not present

## 2017-04-06 DIAGNOSIS — I70222 Atherosclerosis of native arteries of extremities with rest pain, left leg: Secondary | ICD-10-CM

## 2017-04-06 DIAGNOSIS — I2699 Other pulmonary embolism without acute cor pulmonale: Secondary | ICD-10-CM | POA: Diagnosis not present

## 2017-04-06 LAB — POCT INR: INR: 1.4

## 2017-04-07 NOTE — Discharge Instructions (Signed)
INSTRUCTIONS FOLLOWING OCULOPLASTIC SURGERY °AMY M. FOWLER, MD ° °AFTER YOUR EYE SURGERY, THER ARE MANY THINGS THWIHC YOU, THE PATIENT, CAN DO TO ASSURE THE BEST POSSIBLE RESULT FROM YOUR OPERATION.  THIS SHEET SHOULD BE REFERRED TO WHENEVER QUESTIONS ARISE.  IF THERE ARE ANY QUESTIONS NOT ANSWERED HERE, DO NOT HESITATE TO CALL OUR OFFICE AT [phone] OR [phone].  THERE IS ALWAYS [facility] AVAILABLE TO CALL IF QUESTIONS OR PROBLEMS ARISE. ° °VISION: Your vision may be blurred and out of focus after surgery until you are able to stop using your ointment, swelling resolves and your eye(s) heal. This may take 1 to 2 weeks at the least.  If your vision becomes gradually more dim or dark, this is not normal and you need to call our office immediately. ° °EYE CARE: For the first 48 hours after surgery, use ice packs frequently - “20 minutes on, 20 minutes off” - to help reduce swelling and bruising.  Small bags of frozen peas or corn make good ice packs along with cloths soaked in ice water.  If you are wearing a patch or other type of dressing following surgery, keep this on for the amount of time specified by your doctor.  For the first week following surgery, you will need to treat your stitches with great care.  If is OK to shower, but take care to not allow soapy water to run into your eye(s) to help reduce changes of infection.  You may gently clean the eyelashes and around the eye(s) with cotton balls and sterile water, BUT DO NOT RUB THE STITCHES VIGOROUSLY.  Keeping your stitches moist with ointment will help promote healing with minimal scar formation. ° °ACTIVITY: When you leave the surgery center, you should go home, rest and be inactive.  The eye(s) may feel scratchy and keeping the eyes closed will allow for faster healing.  The first week following surgery, avoid straining (anything making the face turn red) or lifting over 20 pounds.  Additionally, avoid bending which causes your head to go below  your waist.  Using your eyes will NOT harm them, so feel free to read, watch television, use the computer, etc as desired.  Driving depends on each individual, so check with your doctor if you have questions about driving. ° °MEDICATIONS:  You will be given a prescription for an ointment to use 4 times a day on your stitches.  You can use the ointment in your eyes if they feel scratchy or irritated.  If you eyelid(s) don’t close completely when you sleep, put some ointment in your eyes before bedtime. ° °EMERGENCY: If you experience SEVERE EYE PAIN OR HEADACHE UNRELIEVED BY TYLENOL OR PERCOCET, NAUSEA OR VOMITING, WORSENING REDNESS, OR WORSENING VISION (ESPECIALLY VISION THAT WA INITIALLY BETTER) CALL [phone] OR [phone] DURING BUSINESS HOURS OR AFTER HOURS. ° °General Anesthesia, Adult, Care After °These instructions provide you with information about caring for yourself after your procedure. Your health care provider may also give you more specific instructions. Your treatment has been planned according to current medical practices, but problems sometimes occur. Call your health care provider if you have any problems or questions after your procedure. °What can I expect after the procedure? °After the procedure, it is common to have: °· Vomiting. °· A sore throat. °· Mental slowness. ° °It is common to feel: °· Nauseous. °· Cold or shivery. °· Sleepy. °· Tired. °· Sore or achy, even in parts of your body where you did not have surgery. ° °  Follow these instructions at home: °For at least 24 hours after the procedure: °· Do not: °? Participate in activities where you could fall or become injured. °? Drive. °? Use heavy machinery. °? Drink alcohol. °? Take sleeping pills or medicines that cause drowsiness. °? Make important decisions or sign legal documents. °? Take care of children on your own. °· Rest. °Eating and drinking °· If you vomit, drink water, juice, or soup when you can drink without  vomiting. °· Drink enough fluid to keep your urine clear or pale yellow. °· Make sure you have little or no nausea before eating solid foods. °· Follow the diet recommended by your health care provider. °General instructions °· Have a responsible adult stay with you until you are awake and alert. °· Return to your normal activities as told by your health care provider. Ask your health care provider what activities are safe for you. °· Take over-the-counter and prescription medicines only as told by your health care provider. °· If you smoke, do not smoke without supervision. °· Keep all follow-up visits as told by your health care provider. This is important. °Contact a health care provider if: °· You continue to have nausea or vomiting at home, and medicines are not helpful. °· You cannot drink fluids or start eating again. °· You cannot urinate after 8-12 hours. °· You develop a skin rash. °· You have fever. °· You have increasing redness at the site of your procedure. °Get help right away if: °· You have difficulty breathing. °· You have chest pain. °· You have unexpected bleeding. °· You feel that you are having a life-threatening or urgent problem. °This information is not intended to replace advice given to you by your health care provider. Make sure you discuss any questions you have with your health care provider. °Document Released: 11/01/2000 Document Revised: 12/29/2015 Document Reviewed: 07/10/2015 °Elsevier Interactive Patient Education © 2018 [employer] ° °

## 2017-04-13 ENCOUNTER — Ambulatory Visit (INDEPENDENT_AMBULATORY_CARE_PROVIDER_SITE_OTHER): Payer: Medicare Other

## 2017-04-13 DIAGNOSIS — Z5181 Encounter for therapeutic drug level monitoring: Secondary | ICD-10-CM

## 2017-04-13 DIAGNOSIS — I2699 Other pulmonary embolism without acute cor pulmonale: Secondary | ICD-10-CM

## 2017-04-13 DIAGNOSIS — I480 Paroxysmal atrial fibrillation: Secondary | ICD-10-CM

## 2017-04-13 LAB — POCT INR: INR: 2.5

## 2017-04-15 NOTE — Progress Notes (Signed)
MRN : [id number]   [name] is a 66 y.o. (12-Dec-1950) female who presents with chief complaint of  Chief Complaint  Patient presents with  . Follow-up    69mo abi,lle art  .  History of Present Illness:   The patient returns to the office for followup status post angiogram with intervention on 03/29/2017. Patient tolerated angiogram well. She denies adverse events that occurred as a result of the angiogram. The patient notes a improvement in the lower extremity symptoms.She states that she can now walk much longer distances without claudication symptoms. She does have some nocturnal cramping but states that this is improved when she gets up and walks around or eats mustard. Denies any rest pain symptoms.   She does have an ulceration to the plantar aspect of her left foot that has had delayed wound healing and is, in part, what prompted the angiogran. The plan is now that better circulation has been established, she will be able to have some of the callous debrided in order to promote wound healing. Denies development of any new ulcers or wounds that have developed since the last visit. She did have some puncture site bleeding in the days immediately post op but she reports that at this time the puncture site is well healed and without significant bruising.  Procedure(s) Performed: 1. Introduction catheter into left lower extremity 3rd order catheter placement with additional third order catheter placement 2. Contrast injection left lower extremity for distal runoff   3. Combination Crosser atherectomy and CSI atherectomy of the left posterior tibial artery 4. Percutaneous transluminal angioplasty left posterior tibial artery with 2.0 through 3.0 diameter Ultraverse balloon             5.   Combination Crosser atherectomy and CSI atherectomy of the left anterior tibial artery                    6.   Percutaneous transluminal angioplasty  of the left anterior tibial artery with 2.0 through 3.0 diameter Ultraverse balloon               7.  Star close closure right common femoral arteriotomy  ABI's performed today show Rt=1.00 and Lt=1.04  (previous ABI's Rt=0.99 and Lt=1.07, obtained on 01/13/2017) Duplex US of the left lower extremity arterial system performed and demonstrated left anterior tibial artery occlusion, doppler velocities that suggest 30-49% stenosis of the distal superficial femoral artery, and popliteal artery if the left lower extremity, and no significant stenosis in the remainder of the left lower extremity arterial system. - impression pending review of Dr. [name].   There have been no significant changes to the patient's overall health care.  The patient denies history of DVT, PE or superficial thrombophlebitis. The patient denies recent episodes of angina or shortness of breath.     Current Meds  Medication Sig  . aspirin 81 MG EC tablet Take 1 tablet (81 mg total) by mouth daily.  Marland Kitchen atenolol (TENORMIN) 100 MG tablet Take 0.5 tablets (50 mg total) by mouth daily. (Patient taking differently: Take 100 mg by mouth daily. )  . enoxaparin (LOVENOX) 120 MG/0.8ML injection Inject 0.79 mLs (120 mg total) into the skin daily.  . furosemide (LASIX) 20 MG tablet Take 1 tablet (20 mg total) by mouth daily as needed. (Patient taking differently: Take 20 mg by mouth daily as needed for fluid or edema. )  . GLIPIZIDE XL 10 MG 24 hr tablet Take 10  mg by mouth daily with breakfast.   . guaiFENesin (MUCINEX) 600 MG 12 hr tablet Take 1 tablet (600 mg total) by mouth daily. Takes 1 tab daily, can take addt'l tab as needed for mucus (Patient taking differently: Take 600 mg by mouth 2 (two) times daily. )  . HYDROcodone-acetaminophen (NORCO/VICODIN) 5-325 MG tablet Take 1-2 tablets by mouth every 4 (four) hours as needed for moderate pain.  . isosorbide mononitrate (IMDUR) 30 MG 24 hr tablet Take 1 tablet (30 mg total) by mouth  2 (two) times daily. Please check with Dr Judithann Sheen  . potassium chloride (K-DUR) 10 MEQ tablet Take 1 tablet (10 mEq total) by mouth 2 (two) times daily as needed. (Patient taking differently: Take 10 mEq by mouth 2 (two) times daily as needed (severe cramps). )  . sitaGLIPtin (JANUVIA) 100 MG tablet Take 100 mg by mouth daily.    Marland Kitchen warfarin (COUMADIN) 5 MG tablet Take as directed by [facility] (Patient taking differently: Take 7.5 mg by mouth daily. Take as directed by [facility])  . zolpidem (AMBIEN) 10 MG tablet Take 10 mg by mouth at bedtime.     Past Medical History:  Diagnosis Date  . Acute on chronic diastolic CHF (congestive heart failure) (HCC) 01/24/2015  . Anemia   . Anxiety   . Atrial fibrillation (HCC)   . Complication of anesthesia    difficult to induce sleep  . Coronary artery disease    Status post coronary stenting approximately 10 years ago  . Depression   . Hyperlipidemia   . Hypertension   . Peripheral artery disease (HCC)   . Pulmonary embolism (HCC)   . Type 2 diabetes mellitus (HCC)     Past Surgical History:  Procedure Laterality Date  . ABDOMINAL AORTOGRAM W/LOWER EXTREMITY N/A 12/14/2016   Procedure: Abdominal Aortogram w/Lower Extremity;  Surgeon: Renford Dills, MD;  Location: [facility];  Service: Cardiovascular;  Laterality: N/A;  . APPENDECTOMY    . CARDIAC CATHETERIZATION N/A 01/03/2015   Procedure: Left Heart Cath;  Surgeon: Antonieta Iba, MD;  Location: [facility];  Service: Cardiovascular;  Laterality: N/A;  . CESAREAN SECTION    . CORONARY ARTERY BYPASS GRAFT N/A 01/07/2015   Procedure: CORONARY ARTERY BYPASS GRAFTING (CABG) x4 using bilateral greater saphenous vein and left internal mammary artery.;  Surgeon: Kerin Perna, MD;  Location: [facility];  Service: Open Heart Surgery;  Laterality: N/A;  . facial fractures    . LOWER EXTREMITY ANGIOGRAPHY Left 12/14/2016   Procedure: Lower Extremity Angiography;  Surgeon:  Renford Dills, MD;  Location: [facility];  Service: Cardiovascular;  Laterality: Left;  . LOWER EXTREMITY ANGIOGRAPHY Left 03/29/2017   Procedure: Lower Extremity Angiography;  Surgeon: Renford Dills, MD;  Location: [facility];  Service: Cardiovascular;  Laterality: Left;  . LOWER EXTREMITY INTERVENTION  12/14/2016   Procedure: Lower Extremity Intervention;  Surgeon: Renford Dills, MD;  Location: [facility];  Service: Cardiovascular;;  . Lt wrist fracture Left   . PERIPHERAL VASCULAR CATHETERIZATION N/A 10/21/2015   Procedure: Abdominal Aortogram w/Lower Extremity;  Surgeon: Renford Dills, MD;  Location: [facility];  Service: Cardiovascular;  Laterality: N/A;  . PERIPHERAL VASCULAR CATHETERIZATION  10/21/2015   Procedure: Lower Extremity Intervention;  Surgeon: Renford Dills, MD;  Location: [facility];  Service: Cardiovascular;;  . PERIPHERAL VASCULAR CATHETERIZATION Left 11/11/2015   Procedure: Renal Angiography;  Surgeon: Earl Lites  Wynonia Lawman, MD;  Location: [facility];  Service: Cardiovascular;  Laterality: Left;  . PERIPHERAL VASCULAR CATHETERIZATION Right 11/11/2015   Procedure: Lower Extremity Angiography;  Surgeon: Renford Dills, MD;  Location: [facility];  Service: Cardiovascular;  Laterality: Right;  . PERIPHERAL VASCULAR CATHETERIZATION  11/11/2015   Procedure: Lower Extremity Intervention;  Surgeon: Renford Dills, MD;  Location: [facility];  Service: Cardiovascular;;  . PERIPHERAL VASCULAR CATHETERIZATION  11/11/2015   Procedure: Renal Intervention;  Surgeon: Renford Dills, MD;  Location: [facility];  Service: Cardiovascular;;  . PERIPHERAL VASCULAR CATHETERIZATION Right 11/25/2015   Procedure: Lower Extremity Angiography;  Surgeon: Renford Dills, MD;  Location: [facility];  Service: Cardiovascular;  Laterality: Right;  . PERIPHERAL VASCULAR CATHETERIZATION   11/25/2015   Procedure: Lower Extremity Intervention;  Surgeon: Renford Dills, MD;  Location: [facility];  Service: Cardiovascular;;  . PERIPHERAL VASCULAR CATHETERIZATION Left 12/17/2015   Procedure: Lower Extremity Angiography;  Surgeon: Renford Dills, MD;  Location: [facility];  Service: Cardiovascular;  Laterality: Left;  . PERIPHERAL VASCULAR CATHETERIZATION  12/17/2015   Procedure: Lower Extremity Intervention;  Surgeon: Renford Dills, MD;  Location: [facility];  Service: Cardiovascular;;  . PERIPHERAL VASCULAR CATHETERIZATION Left 05/11/2016   Procedure: Lower Extremity Angiography;  Surgeon: Renford Dills, MD;  Location: [facility];  Service: Cardiovascular;  Laterality: Left;  . PERIPHERAL VASCULAR CATHETERIZATION  05/11/2016   Procedure: Lower Extremity Intervention;  Surgeon: Renford Dills, MD;  Location: [facility];  Service: Cardiovascular;;  . TONSILLECTOMY AND ADENOIDECTOMY    . TRANSLUMINAL ANGIOPLASTY  01/30/2013   L posterior tibial artery, L tibioperoneal trunk, L SFA  . TRANSLUMINAL ATHERECTOMY TIBIAL ARTERY  01/30/2013    Social History Social History  Substance Use Topics  . Smoking status: Never Smoker  . Smokeless tobacco: Never Used  . Alcohol use No    Family History Family History  Problem Relation Age of Onset  . Hypertension Mother   . CAD Mother   . Transient ischemic attack Mother   . Cancer Father     Allergies  Allergen Reactions  . Oxycodone Hcl Other (See Comments)    Palpitations, fainted, stopped breathing  . Metformin And Related Diarrhea  . Biaxin [Clarithromycin] Itching  . Isosorbide Itching and Cough  . Losartan Itching and Cough  . Atorvastatin Cough  . Codeine Nausea Only and Other (See Comments)    Constipates; doesn't like the way it makes her feel      REVIEW OF SYSTEMS (Negative unless checked)  Constitutional: Weight loss  Fever  Chills Cardiac:  Chest pain   Chest pressure   Palpitations   Shortness of breath when laying flat   Shortness of breath with exertion. Vascular:  Pain in legs with walking   Pain in legs at rest  History of DVT   Phlebitis   Swelling in legs   Varicose veins   Non-healing ulcers  nocturnal cramps   Pulmonary:   Uses home oxygen   Productive cough   Hemoptysis   Wheeze  COPD   Asthma Neurologic:  Dizziness   Seizures   History of stroke   History of TIA  Aphasia   Facial droop  Visual changes   Weakness or numbness in arm   Weakness or numbness in leg Musculoskeletal:   Joint swelling   Joint pain   Low back  pain Hematologic:  [] Easy bruising  [] Easy bleeding   [] Hypercoagulable state   [] Anemic Gastrointestinal:  [] Diarrhea   [] Vomiting  []  Abdominal pain  [] Difficulty swallowing.  []  Blood in stool Genitourinary:  [] Chronic kidney disease   [] Difficult urination  [] Frequent urination   [] Blood in urine Skin:  [] Rashes   [x] Ulcers  Psychological:  [] History of anxiety   []  History of major depression.  Physical Examination  Vitals:   04/18/17 1147  BP: (!) 189/82  Pulse: (!) 56  Resp: 17  Weight: 179 lb (81.2 kg)   Body mass index is 27.22 kg/m. Gen: WD/WN, NAD Head: [place]/AT, No temporalis wasting.  Ear/Nose/Throat: Hearing grossly intact, nares w/o erythema or drainage Eyes: PER, EOMI, sclera nonicteric.  Pulmonary:  Good air movement, no use of accessory muscles.  Cardiac: RRR, no JVD Vascular:  Vessel Right Left  Radial Palpable Palpable  Ulnar    Brachial    Carotid    Femoral    Popliteal    PT +1 Palpable Not Palpable  DP +1 Palpable +1 Palpable   No lower extremity swelling noted  Musculoskeletal: No deformity or atrophy.  Neurologic: Facial movements symmetrical.  Speech is fluent.  Psychiatric: Judgment intact, Mood & affect appropriate for pt's clinical situation. Dermatologic: Ulcer present to plantar aspect of left  foot, heavy callous burden also present to this area. No changes consistent with cellulitis or stasis dermatitis. Lymph : No lichenification or skin changes of chronic lymphedema.  CBC Lab Results  Component Value Date   WBC 7.7 03/30/2017   HGB 12.4 03/30/2017   HCT 36.0 03/30/2017   MCV 86.3 03/30/2017   PLT 226 03/30/2017    BMET    Component Value Date/Time   NA 137 03/30/2017 0633   NA 136 09/26/2013 0928   K 4.4 03/30/2017 0633   K 4.5 09/26/2013 0928   CL 106 03/30/2017 0633   CL 104 09/26/2013 0928   CO2 26 03/30/2017 0633   CO2 31 09/26/2013 0928   GLUCOSE 313 (H) 03/30/2017 0633   GLUCOSE 145 (H) 09/26/2013 0928   BUN 17 03/30/2017 0633   BUN 19 (H) 09/26/2013 0928   CREATININE 0.59 03/30/2017 0633   CREATININE 0.85 09/26/2013 0928   CALCIUM 8.7 (L) 03/30/2017 0633   CALCIUM 9.8 09/26/2013 0928   GFRNONAA >60 03/30/2017 0633   GFRNONAA >60 09/26/2013 0928   GFRAA >60 03/30/2017 0633   GFRAA >60 09/26/2013 0928   Estimated Creatinine Clearance: 78.4 mL/min (by C-G formula based on SCr of 0.59 mg/dL).  COAG Lab Results  Component Value Date   INR 2.5 04/13/2017   INR 1.4 04/06/2017   INR 1.11 03/30/2017    Radiology Dg Chest 2 View  Result Date: 03/28/2017 CLINICAL DATA:  [facility] surgery.  Preoperative chest x-ray. EXAM: CHEST  2 VIEW COMPARISON:  CT chest 07/07/2016.  Chest x-ray 01/10/2015. FINDINGS: Mediastinum hilar structures normal. Lungs are clear. No pleural effusion or pneumothorax . Prior CABG. Stable cardiomegaly. IMPRESSION: 1.  Prior CABG.  Stable cardiomegaly. 2. No acute pulmonary disease. Electronically Signed   By: Maisie Fushomas  Register   On: 03/28/2017 16:14    Assessment/Plan  1. 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 The patient reports that the claudication symptoms and leg pain is essentially gone. The patient denies  lifestyle limiting changes at this point in time.  No further invasive studies, angiography or surgery at  this time The patient should continue walking and begin a more formal exercise program.  The patient should continue antiplatelet therapy and aggressive treatment of the lipid abnormalities  The patient should continue wearing graduated compression socks 10-15 mmHg strength to control the mild edema.  Patient should undergo noninvasive studies as ordered. The patient will follow up with me after the studies.   2. Mixed hyperlipidemia Continue statin as ordered and reviewed, no changes at this time  3. Type 2 diabetes mellitus with other circulatory complication, without long-term current use of insulin (HCC) Continue anti-hyperglycemic medications as currently prescribed, these medications have been reviewed and there are no changes at this time. Hgb A1C to be monitored as previously arranged primary care.   4. Essential Hypertension  Patient is hypertensive today. She has hx of same and is being managed by her PCP for this. It appears her BP has been elevated at her last few visits. Recommend she f/u with her PCP for possible BP medication adjustment.     -Red flags and when to present for emergency care or RTC including fever >101.59F, chest pain, shortness of breath, new/worsening/un-resolving symptoms, and/or S/S of ischemic leg reviewed with patient at time of visit. Follow up and care instructions discussed and patient verbalized understanding.  I have discussed this patient with Dr. [name] who agrees with this plan of care.   Clarene Duke, NP  04/18/2017 3:06 PM

## 2017-04-17 DIAGNOSIS — I25708 Atherosclerosis of coronary artery bypass graft(s), unspecified, with other forms of angina pectoris: Secondary | ICD-10-CM | POA: Insufficient documentation
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
## 2017-04-18 ENCOUNTER — Ambulatory Visit (INDEPENDENT_AMBULATORY_CARE_PROVIDER_SITE_OTHER): Payer: Medicare Other | Admitting: Vascular Surgery

## 2017-04-18 ENCOUNTER — Ambulatory Visit (INDEPENDENT_AMBULATORY_CARE_PROVIDER_SITE_OTHER): Payer: Medicare Other

## 2017-04-18 ENCOUNTER — Encounter (INDEPENDENT_AMBULATORY_CARE_PROVIDER_SITE_OTHER): Payer: Self-pay | Admitting: Vascular Surgery

## 2017-04-18 VITALS — BP 189/82 | HR 56 | Resp 17 | Wt 179.0 lb

## 2017-04-18 DIAGNOSIS — I739 Peripheral vascular disease, unspecified: Secondary | ICD-10-CM

## 2017-04-18 DIAGNOSIS — E1159 Type 2 diabetes mellitus with other circulatory complications: Secondary | ICD-10-CM | POA: Diagnosis not present

## 2017-04-18 DIAGNOSIS — I10 Essential (primary) hypertension: Secondary | ICD-10-CM

## 2017-04-18 DIAGNOSIS — E782 Mixed hyperlipidemia: Secondary | ICD-10-CM | POA: Diagnosis not present

## 2017-04-18 DIAGNOSIS — I70222 Atherosclerosis of native arteries of extremities with rest pain, left leg: Secondary | ICD-10-CM

## 2017-04-18 NOTE — Patient Instructions (Signed)
Peripheral Vascular Disease Peripheral vascular disease (PVD) is a disease of the blood vessels that are not part of your heart and brain. A simple term for PVD is poor circulation. In most cases, PVD narrows the blood vessels that carry blood from your heart to the rest of your body. This can result in a decreased supply of blood to your arms, legs, and internal organs, like your stomach or kidneys. However, it most often affects a person's lower legs and feet. There are two types of PVD.  Organic PVD. This is the more common type. It is caused by damage to the structure of blood vessels.  Functional PVD. This is caused by conditions that make blood vessels contract and tighten (spasm).  Without treatment, PVD tends to get worse over time. PVD can also lead to acute ischemic limb. This is when an arm or limb suddenly has trouble getting enough blood. This is a medical emergency. Follow these instructions at home:  Take medicines only as told by your doctor.  Do not use any tobacco products, including cigarettes, chewing tobacco, or electronic cigarettes. If you need help quitting, ask your doctor.  Lose weight if you are overweight, and maintain a healthy weight as told by your doctor.  Eat a diet that is low in fat and cholesterol. If you need help, ask your doctor.  Exercise regularly. Ask your doctor for some good activities for you.  Take good care of your feet. ? Wear comfortable shoes that fit well. ? Check your feet often for any cuts or sores. Contact a doctor if:  You have cramps in your legs while walking.  You have leg pain when you are at rest.  You have coldness in a leg or foot.  Your skin changes.  You are unable to get or have an erection (erectile dysfunction).  You have cuts or sores on your feet that are not healing. Get help right away if:  Your arm or leg turns cold and blue.  Your arms or legs become red, warm, swollen, painful, or numb.  You have  chest pain or trouble breathing.  You suddenly have weakness in your face, arm, or leg.  You become very confused or you cannot speak.  You suddenly have a very bad headache.  You suddenly cannot see. This information is not intended to replace advice given to you by your health care provider. Make sure you discuss any questions you have with your health care provider. Document Released: 10/20/2009 Document Revised: 01/01/2016 Document Reviewed: 01/03/2014 Else[employer] Interactive Patient Education  2017 [employer]  

## 2017-04-19 ENCOUNTER — Ambulatory Visit: Payer: Medicare Other | Admitting: Cardiovascular Disease

## 2017-05-02 ENCOUNTER — Ambulatory Visit (INDEPENDENT_AMBULATORY_CARE_PROVIDER_SITE_OTHER): Payer: Medicare Other

## 2017-05-02 DIAGNOSIS — I480 Paroxysmal atrial fibrillation: Secondary | ICD-10-CM

## 2017-05-02 DIAGNOSIS — Z5181 Encounter for therapeutic drug level monitoring: Secondary | ICD-10-CM

## 2017-05-02 DIAGNOSIS — I2699 Other pulmonary embolism without acute cor pulmonale: Secondary | ICD-10-CM

## 2017-05-02 LAB — POCT INR: INR: 2.7

## 2017-05-03 ENCOUNTER — Encounter (INDEPENDENT_AMBULATORY_CARE_PROVIDER_SITE_OTHER): Payer: Self-pay

## 2017-05-10 ENCOUNTER — Ambulatory Visit: Admission: RE | Admit: 2017-05-10 | Payer: Medicare Other | Source: Ambulatory Visit | Admitting: Ophthalmology

## 2017-05-10 SURGERY — BLEPHAROPLASTY
Anesthesia: Monitor Anesthesia Care | Laterality: Bilateral

## 2017-05-16 ENCOUNTER — Ambulatory Visit
Admission: RE | Admit: 2017-05-16 | Discharge: 2017-05-16 | Disposition: A | Discharge status: Home / Self Care | Payer: Medicare Other | Source: Ambulatory Visit | Attending: General Surgery | Admitting: General Surgery

## 2017-05-16 ENCOUNTER — Other Ambulatory Visit: Payer: Self-pay | Admitting: General Surgery

## 2017-05-16 ENCOUNTER — Ambulatory Visit
Admission: RE | Admit: 2017-05-16 | Discharge: 2017-05-16 | Disposition: A | Discharge status: Home / Self Care | Payer: Medicare Other | Source: Ambulatory Visit | Attending: Surgery | Admitting: Surgery

## 2017-05-16 DIAGNOSIS — L97529 Non-pressure chronic ulcer of other part of left foot with unspecified severity: Secondary | ICD-10-CM

## 2017-05-23 ENCOUNTER — Ambulatory Visit (INDEPENDENT_AMBULATORY_CARE_PROVIDER_SITE_OTHER): Payer: Medicare Other

## 2017-05-23 DIAGNOSIS — Z5181 Encounter for therapeutic drug level monitoring: Secondary | ICD-10-CM

## 2017-05-23 DIAGNOSIS — I480 Paroxysmal atrial fibrillation: Secondary | ICD-10-CM

## 2017-05-23 DIAGNOSIS — I2699 Other pulmonary embolism without acute cor pulmonale: Secondary | ICD-10-CM

## 2017-05-23 LAB — POCT INR: INR: 2.3
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
## 2017-05-24 ENCOUNTER — Ambulatory Visit (INDEPENDENT_AMBULATORY_CARE_PROVIDER_SITE_OTHER): Payer: Medicare Other | Admitting: Cardiovascular Disease

## 2017-05-24 ENCOUNTER — Encounter: Payer: Self-pay | Admitting: Cardiovascular Disease

## 2017-05-24 VITALS — BP 152/70 | HR 63 | Ht 68.5 in | Wt 179.0 lb

## 2017-05-24 DIAGNOSIS — Z951 Presence of aortocoronary bypass graft: Secondary | ICD-10-CM

## 2017-05-24 DIAGNOSIS — E1159 Type 2 diabetes mellitus with other circulatory complications: Secondary | ICD-10-CM | POA: Diagnosis not present

## 2017-05-24 DIAGNOSIS — R0602 Shortness of breath: Secondary | ICD-10-CM | POA: Diagnosis not present

## 2017-05-24 DIAGNOSIS — I70222 Atherosclerosis of native arteries of extremities with rest pain, left leg: Secondary | ICD-10-CM | POA: Diagnosis not present

## 2017-05-24 DIAGNOSIS — I480 Paroxysmal atrial fibrillation: Secondary | ICD-10-CM

## 2017-05-24 DIAGNOSIS — I209 Angina pectoris, unspecified: Secondary | ICD-10-CM | POA: Diagnosis not present

## 2017-05-24 DIAGNOSIS — I739 Peripheral vascular disease, unspecified: Secondary | ICD-10-CM | POA: Diagnosis not present

## 2017-05-24 DIAGNOSIS — I25708 Atherosclerosis of coronary artery bypass graft(s), unspecified, with other forms of angina pectoris: Secondary | ICD-10-CM

## 2017-05-24 DIAGNOSIS — R079 Chest pain, unspecified: Secondary | ICD-10-CM | POA: Diagnosis not present

## 2017-05-24 DIAGNOSIS — I5033 Acute on chronic diastolic (congestive) heart failure: Secondary | ICD-10-CM

## 2017-05-24 DIAGNOSIS — E782 Mixed hyperlipidemia: Secondary | ICD-10-CM | POA: Diagnosis not present

## 2017-05-24 MED ORDER — ROSUVASTATIN CALCIUM 10 MG PO TABS: 10.0000 mg | ORAL_TABLET | Freq: Every day | ORAL | 11 refills | Status: DC

## 2017-05-24 MED ORDER — EZETIMIBE 10 MG PO TABS: 10.0000 mg | ORAL_TABLET | Freq: Every day | ORAL | 11 refills | Status: DC

## 2017-05-24 NOTE — Patient Instructions (Addendum)
Medication Instructions:   Please start zetia daily for cholesterol After a few weeks, start low dose crestor  Hold the aspirin  Labwork:  Lipids with Dr. Judithann Sheen  Testing/Procedures:  We will order a treadmill  myoview for SOB and chest pain Stop the atenolol and isosorbide night before and morning of the test No food morning of the test  [facility] MYOVIEW  Your caregiver has ordered a Stress Test with nuclear imaging. The purpose of this test is to evaluate the blood supply to your heart muscle. This procedure is referred to as a "Non-Invasive Stress Test." This is because other than having an IV started in your vein, nothing is inserted or "invades" your body. Cardiac stress tests are done to find areas of poor blood flow to the heart by determining the extent of coronary artery disease (CAD).   Please note: these test may take anywhere between 2-4 hours to complete  PLEASE REPORT TO [facility]  THE VOLUNTEERS AT THE FIRST DESK WILL DIRECT YOU WHERE TO GO  Date of Procedure:_Monday October 22nd __  Arrival Time for Procedure:___07:15AM_____  Instructions regarding medication:   __X__ : Hold diabetes medication morning of procedure  _X___:  Hold Atenolol and Isosorbide mononitrate the night before procedure and morning of procedure   PLEASE NOTIFY THE OFFICE AT LEAST 24 HOURS IN ADVANCE IF YOU ARE UNABLE TO KEEP YOUR APPOINTMENT.  [phone] AND  PLEASE NOTIFY NUCLEAR MEDICINE AT [facility] AT LEAST 24 HOURS IN ADVANCE IF YOU ARE UNABLE TO KEEP YOUR APPOINTMENT. [phone]  How to prepare for your Myoview test:  1. Do not eat or drink after midnight 2. No caffeine for 24 hours prior to test 3. No smoking 24 hours prior to test. 4. Your medication may be taken with water.  If your doctor stopped a medication because of this test, do not take that medication. 5. Ladies, please do not wear dresses.  Skirts or pants are appropriate. Please wear a short sleeve  shirt. 6. No perfume, cologne or lotion. 7. Wear comfortable walking shoes. No heels!   Follow-Up: It was a pleasure seeing you in the office today. Please call us if you have new issues that need to be addressed before your next appt.  [phone]  Your physician wants you to follow-up in: 6 months.  You will receive a reminder letter in the mail two months in advance. If you don't receive a letter, please call our office to schedule the follow-up appointment.  If you need a refill on your cardiac medications before your next appointment, please call your pharmacy.

## 2017-05-30 ENCOUNTER — Ambulatory Visit
Admission: RE | Admit: 2017-05-30 | Discharge: 2017-05-30 | Disposition: A | Discharge status: Home / Self Care | Payer: Medicare Other | Source: Ambulatory Visit | Attending: Cardiovascular Disease | Admitting: Cardiovascular Disease

## 2017-05-30 DIAGNOSIS — R079 Chest pain, unspecified: Secondary | ICD-10-CM | POA: Diagnosis not present

## 2017-05-30 DIAGNOSIS — R0602 Shortness of breath: Secondary | ICD-10-CM

## 2017-05-30 DIAGNOSIS — R9431 Abnormal electrocardiogram [ECG] [EKG]: Secondary | ICD-10-CM | POA: Insufficient documentation

## 2017-05-30 LAB — NM MYOCAR MULTI W/SPECT W/WALL MOTION / EF
CHL CUP NUCLEAR SDS: 2
CHL CUP NUCLEAR SRS: 5
CHL CUP NUCLEAR SSS: 0
CHL CUP RESTING HR STRESS: 85 {beats}/min
CSEPPHR: 90 {beats}/min
LV dias vol: 125 mL (ref 46–106)
LV sys vol: 70 mL
Percent HR: 58 %
TID: 0.85

## 2017-05-30 MED ORDER — TECHNETIUM TC 99M TETROFOSMIN IV KIT
31.9500 | PACK | Freq: Once | INTRAVENOUS | Status: AC | PRN
  Administered 2017-05-30: 31.95 via INTRAVENOUS

## 2017-05-30 MED ORDER — REGADENOSON 0.4 MG/5ML IV SOLN
0.4000 mg | Freq: Once | INTRAVENOUS | Status: AC
  Administered 2017-05-30: 0.4 mg via INTRAVENOUS

## 2017-05-30 MED ORDER — TECHNETIUM TC 99M TETROFOSMIN IV KIT
13.0000 | PACK | Freq: Once | INTRAVENOUS | Status: AC | PRN
  Administered 2017-05-30: 12.1 via INTRAVENOUS

## 2017-06-02 ENCOUNTER — Other Ambulatory Visit: Payer: Self-pay | Admitting: Cardiovascular Disease

## 2017-06-03 ENCOUNTER — Telehealth: Payer: Self-pay | Admitting: Cardiovascular Disease

## 2017-06-03 NOTE — Telephone Encounter (Signed)
Pt calling asking about Myoview results  She would like some peace of mind before the weekend  Please call back

## 2017-06-03 NOTE — Telephone Encounter (Signed)
Patient notified of results and verbalized understanding.  

## 2017-06-20 ENCOUNTER — Ambulatory Visit (INDEPENDENT_AMBULATORY_CARE_PROVIDER_SITE_OTHER): Payer: Medicare Other

## 2017-06-20 DIAGNOSIS — I480 Paroxysmal atrial fibrillation: Secondary | ICD-10-CM

## 2017-06-20 DIAGNOSIS — Z5181 Encounter for therapeutic drug level monitoring: Secondary | ICD-10-CM

## 2017-06-20 DIAGNOSIS — I2699 Other pulmonary embolism without acute cor pulmonale: Secondary | ICD-10-CM | POA: Diagnosis not present

## 2017-06-20 LAB — POCT INR: INR: 2.5

## 2017-07-05 ENCOUNTER — Other Ambulatory Visit: Payer: Self-pay | Admitting: General Surgery

## 2017-07-05 DIAGNOSIS — B999 Unspecified infectious disease: Secondary | ICD-10-CM

## 2017-07-11 ENCOUNTER — Other Ambulatory Visit: Payer: Self-pay | Admitting: Orthopedic Surgery

## 2017-07-11 DIAGNOSIS — M25552 Pain in left hip: Secondary | ICD-10-CM

## 2017-07-15 ENCOUNTER — Ambulatory Visit: Payer: Medicare Other

## 2017-07-16 ENCOUNTER — Ambulatory Visit: Payer: Medicare Other

## 2017-07-16 ENCOUNTER — Other Ambulatory Visit: Payer: Self-pay | Admitting: Cardiovascular Disease

## 2017-07-18 ENCOUNTER — Encounter (INDEPENDENT_AMBULATORY_CARE_PROVIDER_SITE_OTHER): Payer: Medicare Other

## 2017-07-18 ENCOUNTER — Ambulatory Visit (INDEPENDENT_AMBULATORY_CARE_PROVIDER_SITE_OTHER): Payer: Medicare Other | Admitting: Vascular Surgery

## 2017-07-21 ENCOUNTER — Encounter (INDEPENDENT_AMBULATORY_CARE_PROVIDER_SITE_OTHER): Payer: Medicare Other

## 2017-07-21 ENCOUNTER — Ambulatory Visit (INDEPENDENT_AMBULATORY_CARE_PROVIDER_SITE_OTHER): Payer: Medicare Other

## 2017-07-21 DIAGNOSIS — I739 Peripheral vascular disease, unspecified: Secondary | ICD-10-CM

## 2017-07-27 ENCOUNTER — Ambulatory Visit (INDEPENDENT_AMBULATORY_CARE_PROVIDER_SITE_OTHER): Payer: Medicare Other

## 2017-07-27 DIAGNOSIS — I2699 Other pulmonary embolism without acute cor pulmonale: Secondary | ICD-10-CM | POA: Diagnosis not present

## 2017-07-27 DIAGNOSIS — Z5181 Encounter for therapeutic drug level monitoring: Secondary | ICD-10-CM | POA: Diagnosis not present

## 2017-07-27 DIAGNOSIS — I480 Paroxysmal atrial fibrillation: Secondary | ICD-10-CM | POA: Diagnosis not present

## 2017-07-27 LAB — POCT INR: INR: 2.4

## 2017-07-27 NOTE — Patient Instructions (Signed)
Continue taking same dosage 1.5 tablets daily.    Recheck in 6 weeks.

## 2017-08-08 ENCOUNTER — Encounter (INDEPENDENT_AMBULATORY_CARE_PROVIDER_SITE_OTHER): Payer: Self-pay | Admitting: Vascular Surgery

## 2017-08-08 ENCOUNTER — Ambulatory Visit (INDEPENDENT_AMBULATORY_CARE_PROVIDER_SITE_OTHER): Payer: Medicare Other | Admitting: Vascular Surgery

## 2017-08-08 VITALS — BP 178/79 | HR 59 | Resp 17 | Wt 186.0 lb

## 2017-08-08 DIAGNOSIS — E782 Mixed hyperlipidemia: Secondary | ICD-10-CM | POA: Diagnosis not present

## 2017-08-08 DIAGNOSIS — I25708 Atherosclerosis of coronary artery bypass graft(s), unspecified, with other forms of angina pectoris: Secondary | ICD-10-CM | POA: Diagnosis not present

## 2017-08-08 DIAGNOSIS — I480 Paroxysmal atrial fibrillation: Secondary | ICD-10-CM | POA: Diagnosis not present

## 2017-08-08 DIAGNOSIS — I10 Essential (primary) hypertension: Secondary | ICD-10-CM | POA: Diagnosis not present

## 2017-08-08 DIAGNOSIS — L97909 Non-pressure chronic ulcer of unspecified part of unspecified lower leg with unspecified severity: Secondary | ICD-10-CM

## 2017-08-08 DIAGNOSIS — I70299 Other atherosclerosis of native arteries of extremities, unspecified extremity: Secondary | ICD-10-CM | POA: Diagnosis not present

## 2017-08-08 DIAGNOSIS — I70222 Atherosclerosis of native arteries of extremities with rest pain, left leg: Secondary | ICD-10-CM

## 2017-08-09 ENCOUNTER — Encounter (INDEPENDENT_AMBULATORY_CARE_PROVIDER_SITE_OTHER): Payer: Self-pay | Admitting: Vascular Surgery

## 2017-08-09 DIAGNOSIS — I499 Cardiac arrhythmia, unspecified: Secondary | ICD-10-CM

## 2017-08-09 HISTORY — DX: Cardiac arrhythmia, unspecified: I49.9

## 2017-08-09 NOTE — Progress Notes (Signed)
MRN : [id number]  [name] is a 67 y.o. (05/28/1951) female who presents with chief complaint of  Chief Complaint  Patient presents with  . Follow-up    abi results  .  History of Present Illness: The patient is seen for evaluation of painful lower extremities and diminished pulses associated with a new ulceration of the right foot.  The patient notes the ulcer has been present for just a couple weeks and has been improving.  It is very painful and has had some drainage.  A specific history of trauma is noted by the patient she walked into a coffee table.  The patient denies fever or chills.  the patient does have diabetes which has been difficult to control.  Left leg has been doing well.  Patient notes prior to the ulcer developing the extremities were painful particularly with ambulation or activity and the discomfort is very consistent day today. Typically, the pain occurs at less than one block, progress is as activity continues to the point that the patient must stop walking. Resting including standing still for several minutes allowed resumption of the activity and the ability to walk a similar distance before stopping again. Uneven terrain and inclined shorten the distance. The pain has been progressive over the past several years.   The patient denies rest pain or dangling of an extremity off the side of the bed during the night for relief. No prior interventions or surgeries.  No history of back problems or DJD of the lumbar sacral spine.   The patient denies amaurosis fugax or recent TIA symptoms. There are no recent neurological changes noted. The patient denies history of DVT, PE or superficial thrombophlebitis. The patient denies recent episodes of angina or shortness of breath.   Current Meds  Medication Sig  . aspirin 81 MG EC tablet Take 1 tablet (81 mg total) by mouth daily.  Marland Kitchen atenolol (TENORMIN) 100 MG tablet Take 0.5 tablets (50 mg total) by mouth daily.  (Patient taking differently: Take 100 mg by mouth daily. )  . doxycycline (VIBRA-TABS) 100 MG tablet   . ezetimibe (ZETIA) 10 MG tablet Take 1 tablet (10 mg total) by mouth daily.  . furosemide (LASIX) 20 MG tablet Take 1 tablet (20 mg total) by mouth daily as needed. (Patient taking differently: Take 20 mg by mouth daily as needed for fluid or edema. )  . GLIPIZIDE XL 10 MG 24 hr tablet Take 10 mg by mouth daily with breakfast.   . guaiFENesin (MUCINEX) 600 MG 12 hr tablet Take 1 tablet (600 mg total) by mouth daily. Takes 1 tab daily, can take addt'l tab as needed for mucus (Patient taking differently: Take 600 mg by mouth 2 (two) times daily. )  . isosorbide mononitrate (IMDUR) 30 MG 24 hr tablet Take 1 tablet (30 mg total) by mouth 2 (two) times daily. Please check with Dr Judithann Sheen  . potassium chloride (K-DUR) 10 MEQ tablet Take 1 tablet (10 mEq total) by mouth 2 (two) times daily as needed. (Patient taking differently: Take 10 mEq by mouth 2 (two) times daily as needed (severe cramps). )  . rosuvastatin (CRESTOR) 10 MG tablet Take 1 tablet (10 mg total) by mouth daily.  Marland Kitchen warfarin (COUMADIN) 5 MG tablet TAKE AS DIRECTED BY COUMADIN CLINIC  . zolpidem (AMBIEN) 10 MG tablet Take 10 mg by mouth at bedtime.     Past Medical History:  Diagnosis Date  . Acute on chronic diastolic CHF (congestive  heart failure) (HCC) 01/24/2015  . Anemia   . Anxiety   . Atrial fibrillation (HCC)   . Complication of anesthesia    difficult to induce sleep  . Coronary artery disease    Status post coronary stenting approximately 10 years ago  . Depression   . Hyperlipidemia   . Hypertension   . Peripheral artery disease (HCC)   . Pulmonary embolism (HCC)   . Type 2 diabetes mellitus (HCC)     Past Surgical History:  Procedure Laterality Date  . ABDOMINAL AORTOGRAM W/LOWER EXTREMITY N/A 12/14/2016   Procedure: Abdominal Aortogram w/Lower Extremity;  Surgeon: Renford Dills, MD;  Location: [facility]  [facility];  Service: Cardiovascular;  Laterality: N/A;  . APPENDECTOMY    . CARDIAC CATHETERIZATION N/A 01/03/2015   Procedure: Left Heart Cath;  Surgeon: Antonieta Iba, MD;  Location: [facility];  Service: Cardiovascular;  Laterality: N/A;  . CESAREAN SECTION    . CORONARY ARTERY BYPASS GRAFT N/A 01/07/2015   Procedure: CORONARY ARTERY BYPASS GRAFTING (CABG) x4 using bilateral greater saphenous vein and left internal mammary artery.;  Surgeon: Kerin Perna, MD;  Location: [facility];  Service: Open Heart Surgery;  Laterality: N/A;  . facial fractures    . LOWER EXTREMITY ANGIOGRAPHY Left 12/14/2016   Procedure: Lower Extremity Angiography;  Surgeon: Renford Dills, MD;  Location: [facility];  Service: Cardiovascular;  Laterality: Left;  . LOWER EXTREMITY ANGIOGRAPHY Left 03/29/2017   Procedure: Lower Extremity Angiography;  Surgeon: Renford Dills, MD;  Location: [facility];  Service: Cardiovascular;  Laterality: Left;  . LOWER EXTREMITY INTERVENTION  12/14/2016   Procedure: Lower Extremity Intervention;  Surgeon: Renford Dills, MD;  Location: [facility] CV [facility];  Service: Cardiovascular;;  . Lt wrist fracture Left   . PERIPHERAL VASCULAR CATHETERIZATION N/A 10/21/2015   Procedure: Abdominal Aortogram w/Lower Extremity;  Surgeon: Renford Dills, MD;  Location: [facility];  Service: Cardiovascular;  Laterality: N/A;  . PERIPHERAL VASCULAR CATHETERIZATION  10/21/2015   Procedure: Lower Extremity Intervention;  Surgeon: Renford Dills, MD;  Location: [facility];  Service: Cardiovascular;;  . PERIPHERAL VASCULAR CATHETERIZATION Left 11/11/2015   Procedure: Renal Angiography;  Surgeon: Renford Dills, MD;  Location: [facility];  Service: Cardiovascular;  Laterality: Left;  . PERIPHERAL VASCULAR CATHETERIZATION Right 11/11/2015   Procedure: Lower Extremity Angiography;  Surgeon: Renford Dills, MD;  Location: [facility]  [facility];  Service: Cardiovascular;  Laterality: Right;  . PERIPHERAL VASCULAR CATHETERIZATION  11/11/2015   Procedure: Lower Extremity Intervention;  Surgeon: Renford Dills, MD;  Location: [facility];  Service: Cardiovascular;;  . PERIPHERAL VASCULAR CATHETERIZATION  11/11/2015   Procedure: Renal Intervention;  Surgeon: Renford Dills, MD;  Location: [facility];  Service: Cardiovascular;;  . PERIPHERAL VASCULAR CATHETERIZATION Right 11/25/2015   Procedure: Lower Extremity Angiography;  Surgeon: Renford Dills, MD;  Location: [facility];  Service: Cardiovascular;  Laterality: Right;  . PERIPHERAL VASCULAR CATHETERIZATION  11/25/2015   Procedure: Lower Extremity Intervention;  Surgeon: Renford Dills, MD;  Location: [facility];  Service: Cardiovascular;;  . PERIPHERAL VASCULAR CATHETERIZATION Left 12/17/2015   Procedure: Lower Extremity Angiography;  Surgeon: Renford Dills, MD;  Location: [facility];  Service: Cardiovascular;  Laterality: Left;  . PERIPHERAL VASCULAR CATHETERIZATION  12/17/2015   Procedure: Lower Extremity Intervention;  Surgeon: Renford Dills, MD;  Location:  [facility];  Service: Cardiovascular;;  . PERIPHERAL VASCULAR CATHETERIZATION Left 05/11/2016   Procedure: Lower Extremity Angiography;  Surgeon: Renford DillsGregory G Larkin Morelos, MD;  Location: [facility];  Service: Cardiovascular;  Laterality: Left;  . PERIPHERAL VASCULAR CATHETERIZATION  05/11/2016   Procedure: Lower Extremity Intervention;  Surgeon: Renford DillsGregory G Keola Heninger, MD;  Location: [facility];  Service: Cardiovascular;;  . TONSILLECTOMY AND ADENOIDECTOMY    . TRANSLUMINAL ANGIOPLASTY  01/30/2013   L posterior tibial artery, L tibioperoneal trunk, L SFA  . TRANSLUMINAL ATHERECTOMY TIBIAL ARTERY  01/30/2013    Social History Social History   Tobacco Use  . Smoking status: Never Smoker  . Smokeless tobacco: Never Used  Substance Use Topics  .  Alcohol use: No  . Drug use: No    Family History Family History  Problem Relation Age of Onset  . Hypertension Mother   . CAD Mother   . Transient ischemic attack Mother   . Cancer Father     Allergies  Allergen Reactions  . Oxycodone Hcl Other (See Comments)    Palpitations, fainted, stopped breathing  . Metformin And Related Diarrhea  . Biaxin [Clarithromycin] Itching  . Isosorbide Itching and Cough  . Losartan Itching and Cough  . Atorvastatin Cough  . Codeine Nausea Only and Other (See Comments)    Constipates; doesn't like the way it makes her feel      REVIEW OF SYSTEMS (Negative unless checked)  Constitutional: [] Weight loss  [] Fever  [] Chills Cardiac: [] Chest pain   [] Chest pressure   [] Palpitations   [] Shortness of breath when laying flat   [] Shortness of breath with exertion. Vascular:  [] Pain in legs with walking   [x] Pain in legs at rest  [] History of DVT   [] Phlebitis   [] Swelling in legs   [] Varicose veins   [x] Non-healing ulcers Pulmonary:   [] Uses home oxygen   [] Productive cough   [] Hemoptysis   [] Wheeze  [] COPD   [] Asthma Neurologic:  [] Dizziness   [] Seizures   [] History of stroke   [] History of TIA  [] Aphasia   [] Vissual changes   [] Weakness or numbness in arm   [] Weakness or numbness in leg Musculoskeletal:   [] Joint swelling   [] Joint pain   [] Low back pain Hematologic:  [] Easy bruising  [] Easy bleeding   [] Hypercoagulable state   [] Anemic Gastrointestinal:  [] Diarrhea   [] Vomiting  [] Gastroesophageal reflux/heartburn   [] Difficulty swallowing. Genitourinary:  [] Chronic kidney disease   [] Difficult urination  [] Frequent urination   [] Blood in urine Skin:  [] Rashes   [x] Ulcers  Psychological:  [] History of anxiety   []  History of major depression.  Physical Examination  Vitals:   08/08/17 0925  BP: (!) 178/79  Pulse: (!) 59  Resp: 17  Weight: 186 lb (84.4 kg)   Body mass index is 27.87 kg/m. Gen: WD/WN, NAD Head: [place]/AT, No temporalis wasting.   Ear/Nose/Throat: Hearing grossly intact, nares w/o erythema or drainage Eyes: PER, EOMI, sclera nonicteric.  Neck: Supple, no large masses.   Pulmonary:  Good air movement, no audible wheezing bilaterally, no use of accessory muscles.  Cardiac: RRR, no JVD Vascular: right foot ulcer not infected Vessel Right Left  Radial Palpable Palpable  Popliteal Palpable Palpable  PT Not Palpable Trace Palpable  DP Not Palpable Not Palpable  Gastrointestinal: Non-distended. No guarding/no peritoneal signs.  Musculoskeletal: M/S 5/5 throughout.  No deformity or atrophy.  Neurologic: CN 2-12 intact. Symmetrical.  Speech is fluent. Motor exam as listed above. Psychiatric: Judgment  intact, Mood & affect appropriate for pt's clinical situation. Dermatologic: No rashes + ulcers noted.  No changes consistent with cellulitis. Lymph : No lichenification or skin changes of chronic lymphedema.  CBC Lab Results  Component Value Date   WBC 7.7 03/30/2017   HGB 12.4 03/30/2017   HCT 36.0 03/30/2017   MCV 86.3 03/30/2017   PLT 226 03/30/2017    BMET    Component Value Date/Time   NA 137 03/30/2017 0633   NA 136 09/26/2013 0928   K 4.4 03/30/2017 0633   K 4.5 09/26/2013 0928   CL 106 03/30/2017 0633   CL 104 09/26/2013 0928   CO2 26 03/30/2017 0633   CO2 31 09/26/2013 0928   GLUCOSE 313 (H) 03/30/2017 0633   GLUCOSE 145 (H) 09/26/2013 0928   BUN 17 03/30/2017 0633   BUN 19 (H) 09/26/2013 0928   CREATININE 0.59 03/30/2017 0633   CREATININE 0.85 09/26/2013 0928   CALCIUM 8.7 (L) 03/30/2017 0633   CALCIUM 9.8 09/26/2013 0928   GFRNONAA >60 03/30/2017 0633   GFRNONAA >60 09/26/2013 0928   GFRAA >60 03/30/2017 0633   GFRAA >60 09/26/2013 0928   CrCl cannot be calculated (Patient's most recent lab result is older than the maximum 21 days allowed.).  COAG Lab Results  Component Value Date   INR 2.4 07/27/2017   INR 2.5 06/20/2017   INR 2.3 05/23/2017    Radiology No results  found.  Assessment/Plan 1. Atherosclerosis of artery of extremity with ulceration (HCC) Recommend:  The patient is status post successful angiogram with intervention on the left.  The patient reports that the claudication symptoms and leg pain are resovled.   However, she now has an ulceration of the right foot.  The patient continues to voice lifestyle limiting changes at this point in time.  Patient's noninvasive studies show the left is stable and the right has mild to moderate disease.  She feels it has been improving.  So, we have made an agreement that she will return in three weeks and if the wound is not imrpvied we will proceed with angiogram.  In the mean time the patient should continue walking and an exercise program.   The patient should continue antiplatelet therapy and aggressive treatment of the lipid abnormalities  Smoking cessation was again discussed  The patient should continue wearing graduated compression socks 10-15 mmHg strength to control the mild edema.   A total of 25 minutes was spent with this patient and greater than 50% was spent in counseling and coordination of care with the patient.  Discussion included the treatment options for vascular disease including indications for surgery and intervention.  Also discussed is the appropriate timing of treatment.  In addition medical therapy was discussed.  2. Coronary artery disease of bypass graft of native heart with stable angina pectoris (HCC) Continue cardiac and antihypertensive medications as already ordered and reviewed, no changes at this time.  Continue statin as ordered and reviewed, no changes at this time  Nitrates PRN for chest pain   3. Essential hypertension Continue antihypertensive medications as already ordered, these medications have been reviewed and there are no changes at this time.   4. PAF (paroxysmal atrial fibrillation) (HCC) Continue antiarrhythmia medications as already ordered,  these medications have been reviewed and there are no changes at this time.  Continue anticoagulation as ordered by Cardiology Service   5. Mixed hyperlipidemia Continue statin as ordered and reviewed, no changes at this time  [name], MD  08/09/2017 1:32 PM

## 2017-08-14 NOTE — Progress Notes (Signed)
MRN : [id number]  [name] is a 67 y.o. (07-01-1951) female who presents with chief complaint of No chief complaint on file. Marland Kitchen.  History of Present Illness: The patient returns to the office for followup and review of the noninvasive studies. There has been a significant deterioration in the right lower extremity symptoms.  I have been contacted by Podiatry that the right 5th metatarsal wound is worse.The patient notes interval shortening of their claudication distance and development of mild rest pain symptoms.  The right  Wound is worse since the last visit.  There have been no significant changes to the patient's overall health care.  The patient denies amaurosis fugax or recent TIA symptoms. There are no recent neurological changes noted. The patient denies history of DVT, PE or superficial thrombophlebitis. The patient denies recent episodes of angina or shortness of breath.    No outpatient medications have been marked as taking for the 08/15/17 encounter (Appointment) with Gilda CreaseSchnier, Latina CraverGregory G, MD.    Past Medical History:  Diagnosis Date  . Acute on chronic diastolic CHF (congestive heart failure) (HCC) 01/24/2015  . Anemia   . Anxiety   . Atrial fibrillation (HCC)   . Complication of anesthesia    difficult to induce sleep  . Coronary artery disease    Status post coronary stenting approximately 10 years ago  . Depression   . Hyperlipidemia   . Hypertension   . Peripheral artery disease (HCC)   . Pulmonary embolism (HCC)   . Type 2 diabetes mellitus (HCC)     Past Surgical History:  Procedure Laterality Date  . ABDOMINAL AORTOGRAM W/LOWER EXTREMITY N/A 12/14/2016   Procedure: Abdominal Aortogram w/Lower Extremity;  Surgeon: Renford DillsSchnier, Anayansi Rundquist G, MD;  Location: [facility] IN[facility]ASIVE [facility];  Service: Cardiovascular;  Laterality: N/A;  . APPENDECTOMY    . CARDIAC CATHETERIZATION N/A 01/03/2015   Procedure: Left Heart Cath;  Surgeon: Antonieta Ibaimothy J Gollan, MD;  Location: [facility]  INVASIVE CV LAB;  Service: Cardiovascular;  Laterality: N/A;  . CESAREAN SECTION    . CORONARY ARTERY BYPASS GRAFT N/A 01/07/2015   Procedure: CORONARY ARTERY BYPASS GRAFTING (CABG) x4 using bilateral greater saphenous vein and left internal mammary artery.;  Surgeon: Kerin PernaPeter Van Trigt, MD;  [facility];  Service: Open Heart Surgery;  Laterality: N/A;  . facial fractures    . LOWER EXTREMITY ANGIOGRAPHY Left 12/14/2016   Procedure: Lower Extremity Angiography;  Surgeon: Renford DillsSchnier, Earley Grobe G, MD;  Location: [facility];  Service: Cardiovascular;  Laterality: Left;  . LOWER EXTREMITY ANGIOGRAPHY Left 03/29/2017   Procedure: Lower Extremity Angiography;  Surgeon: Renford DillsSchnier, Kaylani Fromme G, MD;  Location: [facility];  Service: Cardiovascular;  Laterality: Left;  . LOWER EXTREMITY INTERVENTION  12/14/2016   Procedure: Lower Extremity Intervention;  Surgeon: Renford DillsSchnier, English Craighead G, MD;  [facility];  Service: Cardiovascular;;  . Lt wrist fracture Left   . PERIPHERAL VASCULAR CATHETERIZATION N/A 10/21/2015   Procedure: Abdominal Aortogram w/Lower Extremity;  Surgeon: Renford DillsGregory G Rakisha Pincock, MD;  Location: [facility];  Service: Cardiovascular;  Laterality: N/A;  . PERIPHERAL VASCULAR CATHETERIZATION  10/21/2015   Procedure: Lower Extremity Intervention;  Surgeon: Renford DillsGregory G Jaretzi Droz, MD;  Location: [facility];  Service: Cardiovascular;;  . PERIPHERAL VASCULAR CATHETERIZATION Left 11/11/2015   Procedure: Renal Angiography;  Surgeon: Renford DillsGregory G Chrystina Naff, MD;  Location: [facility];  Service: Cardiovascular;  Laterality: Left;  . PERIPHERAL VASCULAR CATHETERIZATION Right 11/11/2015   Procedure:  Lower Extremity Angiography;  Surgeon: Renford Dills, MD;  Location: [facility] INVASIVE CV LAB;  Service: Cardiovascular;  Laterality: Right;  . PERIPHERAL VASCULAR CATHETERIZATION  11/11/2015   Procedure: Lower Extremity Intervention;  Surgeon: Renford Dills, MD;  Location: [facility]  INVASIVE CV LAB;  Service: Cardiovascular;;  . PERIPHERAL VASCULAR CATHETERIZATION  11/11/2015   Procedure: Renal Intervention;  Surgeon: Renford Dills, MD;  Location: [facility];  Service: Cardiovascular;;  . PERIPHERAL VASCULAR CATHETERIZATION Right 11/25/2015   Procedure: Lower Extremity Angiography;  Surgeon: Renford Dills, MD;  Location: [facility];  Service: Cardiovascular;  Laterality: Right;  . PERIPHERAL VASCULAR CATHETERIZATION  11/25/2015   Procedure: Lower Extremity Intervention;  Surgeon: Renford Dills, MD;  Location: [facility];  Service: Cardiovascular;;  . PERIPHERAL VASCULAR CATHETERIZATION Left 12/17/2015   Procedure: Lower Extremity Angiography;  Surgeon: Renford Dills, MD;  Location: [facility];  Service: Cardiovascular;  Laterality: Left;  . PERIPHERAL VASCULAR CATHETERIZATION  12/17/2015   Procedure: Lower Extremity Intervention;  Surgeon: Renford Dills, MD;  Location: [facility];  Service: Cardiovascular;;  . PERIPHERAL VASCULAR CATHETERIZATION Left 05/11/2016   Procedure: Lower Extremity Angiography;  Surgeon: Renford Dills, MD;  Location: [facility];  Service: Cardiovascular;  Laterality: Left;  . PERIPHERAL VASCULAR CATHETERIZATION  05/11/2016   Procedure: Lower Extremity Intervention;  Surgeon: Renford Dills, MD;  Location: [facility] IN[facility];  Service: Cardiovascular;;  . TONSILLECTOMY AND ADENOIDECTOMY    . TRANSLUMINAL ANGIOPLASTY  01/30/2013   L posterior tibial artery, L tibioperoneal trunk, L SFA  . TRANSLUMINAL ATHERECTOMY TIBIAL ARTERY  01/30/2013    Social History Social History   Tobacco Use  . Smoking status: Never Smoker  . Smokeless tobacco: Never Used  Substance Use Topics  . Alcohol use: No  . Drug use: No    Family History Family History  Problem Relation Age of Onset  . Hypertension Mother   . CAD Mother   . Transient ischemic attack Mother   . Cancer  Father     Allergies  Allergen Reactions  . Oxycodone Hcl Other (See Comments)    Palpitations, fainted, stopped breathing  . Metformin And Related Diarrhea  . Biaxin [Clarithromycin] Itching  . Isosorbide Itching and Cough  . Losartan Itching and Cough  . Atorvastatin Cough  . Codeine Nausea Only and Other (See Comments)    Constipates; doesn't like the way it makes her feel      REVIEW OF SYSTEMS (Negative unless checked)  Constitutional: [] Weight loss  [] Fever  [] Chills Cardiac: [] Chest pain   [] Chest pressure   [] Palpitations   [] Shortness of breath when laying flat   [] Shortness of breath with exertion. Vascular:  [] Pain in legs with walking   [] Pain in legs at rest  [] History of DVT   [] Phlebitis   [] Swelling in legs   [] Varicose veins   [x] Non-healing ulcers Pulmonary:   [] Uses home oxygen   [] Productive cough   [] Hemoptysis   [] Wheeze  [] COPD   [] Asthma Neurologic:  [] Dizziness   [] Seizures   [] History of stroke   [] History of TIA  [] Aphasia   [] Vissual changes   [] Weakness or numbness in arm   [] Weakness or numbness in leg Musculoskeletal:   [] Joint swelling   [] Joint pain   [] Low back pain Hematologic:  [] Easy bruising  [] Easy bleeding   [] Hypercoagulable state   [] Anemic Gastrointestinal:  [] Diarrhea   [] Vomiting  [] Gastroesophageal reflux/heartburn   []   Difficulty swallowing. Genitourinary:  [] Chronic kidney disease   [] Difficult urination  [] Frequent urination   [] Blood in urine Skin:  [] Rashes   [x] Ulcers  Psychological:  [] History of anxiety   []  History of major depression.  Physical Examination  There were no vitals filed for this visit. There is no height or weight on file to calculate BMI. Gen: WD/WN, NAD Head: [place]/AT, No temporalis wasting.  Ear/Nose/Throat: Hearing grossly intact, nares w/o erythema or drainage Eyes: PER, EOMI, sclera nonicteric.  Neck: Supple, no large masses.   Pulmonary:  Good air movement, no audible wheezing bilaterally, no use of  accessory muscles.  Cardiac: RRR, no JVD Vascular:  Right 5th ray with ulcer and dry gangrene, cellulitis surrounding the ulcer  Vessel Right Left  Radial Palpable Palpable  PT Not Palpable Trace Palpable  DP Not Palpable Not Palpable  Gastrointestinal: Non-distended. No guarding/no peritoneal signs.  Musculoskeletal: M/S 5/5 throughout.  No deformity or atrophy.  Neurologic: CN 2-12 intact. Symmetrical.  Speech is fluent. Motor exam as listed above. Psychiatric: Judgment intact, Mood & affect appropriate for pt's clinical situation. Dermatologic: + rashes with ulcer right foot.  No changes consistent with cellulitis. Lymph : No lichenification or skin changes of chronic lymphedema.  CBC Lab Results  Component Value Date   WBC 7.7 03/30/2017   HGB 12.4 03/30/2017   HCT 36.0 03/30/2017   MCV 86.3 03/30/2017   PLT 226 03/30/2017    BMET    Component Value Date/Time   NA 137 03/30/2017 0633   NA 136 09/26/2013 0928   K 4.4 03/30/2017 0633   K 4.5 09/26/2013 0928   CL 106 03/30/2017 0633   CL 104 09/26/2013 0928   CO2 26 03/30/2017 0633   CO2 31 09/26/2013 0928   GLUCOSE 313 (H) 03/30/2017 0633   GLUCOSE 145 (H) 09/26/2013 0928   BUN 17 03/30/2017 0633   BUN 19 (H) 09/26/2013 0928   CREATININE 0.59 03/30/2017 0633   CREATININE 0.85 09/26/2013 0928   CALCIUM 8.7 (L) 03/30/2017 0633   CALCIUM 9.8 09/26/2013 0928   GFRNONAA >60 03/30/2017 0633   GFRNONAA >60 09/26/2013 0928   GFRAA >60 03/30/2017 0633   GFRAA >60 09/26/2013 0928   CrCl cannot be calculated (Patient's most recent lab result is older than the maximum 21 days allowed.).  COAG Lab Results  Component Value Date   INR 2.4 07/27/2017   INR 2.5 06/20/2017   INR 2.3 05/23/2017    Radiology No results found.  Assessment/Plan 1. Atherosclerosis of artery of extremity with ulceration (HCC)  Recommend:  The patient has evidence of severe atherosclerotic changes of both lower extremities associated with  ulceration and tissue loss of the right foot.  This represents a limb threatening ischemia and places the patient at the risk for limb loss.  Patient should undergo angiography of the right lower extremity with the hope for intervention for limb salvage.  The risks and benefits as well as the alternative therapies was discussed in detail with the patient.  All questions were answered.  Patient agrees to proceed with angiography.  She has been on two courses of Augmentin and her wound appears to be worse than her last visit so I will change her to doxycycline.  The patient will follow up with me in the office after the procedure.    2. Coronary artery disease of bypass graft of native heart with stable angina pectoris (HCC) Continue cardiac and antihypertensive medications as already ordered and reviewed, no  changes at this time.  Continue statin as ordered and reviewed, no changes at this time  Nitrates PRN for chest pain   3. PAF (paroxysmal atrial fibrillation) (HCC) Continue antiarrhythmia medications as already ordered, these medications have been reviewed and there are no changes at this time.  Continue anticoagulation as ordered by Cardiology Service   4. Type 2 diabetes mellitus with other circulatory complication, without long-term current use of insulin (HCC) Continue hypoglycemic medications as already ordered, these medications have been reviewed and there are no changes at this time.  Hgb A1C to be monitored as already arranged by primary service   5. Mixed hyperlipidemia Continue statin as ordered and reviewed, no changes at this time     [name], MD  08/14/2017 12:53 PM

## 2017-08-15 ENCOUNTER — Encounter (INDEPENDENT_AMBULATORY_CARE_PROVIDER_SITE_OTHER): Payer: Self-pay

## 2017-08-15 ENCOUNTER — Other Ambulatory Visit (INDEPENDENT_AMBULATORY_CARE_PROVIDER_SITE_OTHER): Payer: Self-pay | Admitting: Vascular Surgery

## 2017-08-15 ENCOUNTER — Ambulatory Visit (INDEPENDENT_AMBULATORY_CARE_PROVIDER_SITE_OTHER): Payer: Medicare Other | Admitting: Vascular Surgery

## 2017-08-15 ENCOUNTER — Encounter (INDEPENDENT_AMBULATORY_CARE_PROVIDER_SITE_OTHER): Payer: Self-pay | Admitting: Vascular Surgery

## 2017-08-15 VITALS — BP 181/73 | HR 72 | Resp 17 | Ht 67.0 in | Wt 187.0 lb

## 2017-08-15 DIAGNOSIS — I209 Angina pectoris, unspecified: Secondary | ICD-10-CM

## 2017-08-15 DIAGNOSIS — I25708 Atherosclerosis of coronary artery bypass graft(s), unspecified, with other forms of angina pectoris: Secondary | ICD-10-CM

## 2017-08-15 DIAGNOSIS — L97909 Non-pressure chronic ulcer of unspecified part of unspecified lower leg with unspecified severity: Secondary | ICD-10-CM

## 2017-08-15 DIAGNOSIS — I480 Paroxysmal atrial fibrillation: Secondary | ICD-10-CM | POA: Diagnosis not present

## 2017-08-15 DIAGNOSIS — L03031 Cellulitis of right toe: Secondary | ICD-10-CM

## 2017-08-15 DIAGNOSIS — E782 Mixed hyperlipidemia: Secondary | ICD-10-CM

## 2017-08-15 DIAGNOSIS — E1159 Type 2 diabetes mellitus with other circulatory complications: Secondary | ICD-10-CM | POA: Diagnosis not present

## 2017-08-15 DIAGNOSIS — I70299 Other atherosclerosis of native arteries of extremities, unspecified extremity: Secondary | ICD-10-CM | POA: Diagnosis not present

## 2017-08-15 DIAGNOSIS — L0390 Cellulitis, unspecified: Secondary | ICD-10-CM | POA: Insufficient documentation

## 2017-08-15 MED ORDER — DOXYCYCLINE HYCLATE 100 MG PO CAPS: 100.0000 mg | ORAL_CAPSULE | Freq: Two times a day (BID) | ORAL | 0 refills | Status: DC

## 2017-08-15 NOTE — Progress Notes (Signed)
9:15am

## 2017-08-16 ENCOUNTER — Telehealth: Payer: Self-pay

## 2017-08-16 NOTE — Telephone Encounter (Signed)
Pt takes Coumadin for afib with CHADS2VASc score of 6 (age, sex, CAD, HTN, CHF, DM), also with history of single provoked PE > 1 year ago. Ok to hold Coumadin for 3 days prior.  Pt is aware she can hold her Coumadin for 3 days prior to procedure and will resume Friday in the PM after her procedure.

## 2017-08-16 NOTE — Telephone Encounter (Signed)
Returned call to the pt and she stated she was taking Augmentin -which does not interact with Coumadin & she started doxycycline  100mg  twice a day for 14 days, she started the med on yesterday. Also, the pt states she will have surgery on Friday, too, by Dr. [facility].  She stated she is supposed to be holding Coumadin for 3 days & that she was told by Dr. Lorretta HarpSchneir to hold Coumadin Tuesday, Wednesday, and Thursday-this week, advised pt that we need to have her cleared to hold her Warfarin by her Cardiologist.  Called Dr. Eustace QuailSchneir's office regarding the pt having a lower extremity angiography on 08/19/17 per April who answered the phone she spoke with Jon GillsAlexis, the person that schedules the procedures and was told that the pt they tell the pt what to do only. Instructed her that with the pt being on Coumadin that it is important that the Cardiologist is aware so they can assess the risk of the pt coming off Coumadin. April stated she would inform Alexis. Also, advised that we need this as the pt is supposed to start holding Coumadin today.

## 2017-08-16 NOTE — Telephone Encounter (Signed)
[place]   Has been taking abx and held coum x 1 day and wants to know if she should bring meds with her for visit.  Please call to advise

## 2017-08-17 ENCOUNTER — Ambulatory Visit (INDEPENDENT_AMBULATORY_CARE_PROVIDER_SITE_OTHER): Payer: Medicare Other

## 2017-08-17 ENCOUNTER — Telehealth (INDEPENDENT_AMBULATORY_CARE_PROVIDER_SITE_OTHER): Payer: Self-pay | Admitting: Vascular Surgery

## 2017-08-17 DIAGNOSIS — I2699 Other pulmonary embolism without acute cor pulmonale: Secondary | ICD-10-CM | POA: Diagnosis not present

## 2017-08-17 DIAGNOSIS — I480 Paroxysmal atrial fibrillation: Secondary | ICD-10-CM | POA: Diagnosis not present

## 2017-08-17 DIAGNOSIS — Z5181 Encounter for therapeutic drug level monitoring: Secondary | ICD-10-CM

## 2017-08-17 LAB — POCT INR: INR: 2

## 2017-08-17 NOTE — Telephone Encounter (Signed)
New Message  Pt verbalized her right foot is in a lot of pain and Clein-MD prescribed her tramadol and pt wants to know what else she can take for the pain.  Pt was crying on the phone due to her pain in her right foot.  Pt verbalize she talked to Schnier-MD, about her right foot and of her procedure she has coming up.  Please f/u with pt

## 2017-08-17 NOTE — Telephone Encounter (Signed)
Will get Dr. Gilda CreaseSchnier to review this patient's new status tomorrow when he is back in the office.

## 2017-08-17 NOTE — Patient Instructions (Signed)
Please hold coumadin x 3 days prior to surgery on Friday per Dr. Gilda CreaseSchnier. Once ok'd to resume coumadin, take extra 1/2 tablet x 2 days, then resume previous dose of 7.5 mg daily. Recheck in 1 week.

## 2017-08-18 ENCOUNTER — Other Ambulatory Visit: Payer: Self-pay

## 2017-08-18 ENCOUNTER — Encounter
Admission: RE | Admit: 2017-08-18 | Discharge: 2017-08-18 | Disposition: A | Discharge status: Home / Self Care | Payer: Medicare Other | Source: Ambulatory Visit | Attending: Vascular Surgery | Admitting: Vascular Surgery

## 2017-08-18 DIAGNOSIS — I70261 Atherosclerosis of native arteries of extremities with gangrene, right leg: Secondary | ICD-10-CM | POA: Diagnosis present

## 2017-08-18 DIAGNOSIS — I110 Hypertensive heart disease with heart failure: Secondary | ICD-10-CM | POA: Diagnosis not present

## 2017-08-18 DIAGNOSIS — E11621 Type 2 diabetes mellitus with foot ulcer: Secondary | ICD-10-CM | POA: Diagnosis not present

## 2017-08-18 DIAGNOSIS — I25708 Atherosclerosis of coronary artery bypass graft(s), unspecified, with other forms of angina pectoris: Secondary | ICD-10-CM | POA: Diagnosis not present

## 2017-08-18 DIAGNOSIS — Z8249 Family history of ischemic heart disease and other diseases of the circulatory system: Secondary | ICD-10-CM | POA: Diagnosis not present

## 2017-08-18 DIAGNOSIS — Z881 Allergy status to other antibiotic agents status: Secondary | ICD-10-CM | POA: Diagnosis not present

## 2017-08-18 DIAGNOSIS — I480 Paroxysmal atrial fibrillation: Secondary | ICD-10-CM | POA: Diagnosis not present

## 2017-08-18 DIAGNOSIS — Z809 Family history of malignant neoplasm, unspecified: Secondary | ICD-10-CM | POA: Diagnosis not present

## 2017-08-18 DIAGNOSIS — Z885 Allergy status to narcotic agent status: Secondary | ICD-10-CM | POA: Diagnosis not present

## 2017-08-18 DIAGNOSIS — I5033 Acute on chronic diastolic (congestive) heart failure: Secondary | ICD-10-CM | POA: Diagnosis not present

## 2017-08-18 DIAGNOSIS — Z86711 Personal history of pulmonary embolism: Secondary | ICD-10-CM | POA: Diagnosis not present

## 2017-08-18 DIAGNOSIS — Z951 Presence of aortocoronary bypass graft: Secondary | ICD-10-CM | POA: Diagnosis not present

## 2017-08-18 DIAGNOSIS — E1152 Type 2 diabetes mellitus with diabetic peripheral angiopathy with gangrene: Secondary | ICD-10-CM | POA: Diagnosis not present

## 2017-08-18 DIAGNOSIS — Z955 Presence of coronary angioplasty implant and graft: Secondary | ICD-10-CM | POA: Diagnosis not present

## 2017-08-18 DIAGNOSIS — L97919 Non-pressure chronic ulcer of unspecified part of right lower leg with unspecified severity: Secondary | ICD-10-CM | POA: Diagnosis not present

## 2017-08-18 DIAGNOSIS — E782 Mixed hyperlipidemia: Secondary | ICD-10-CM | POA: Diagnosis not present

## 2017-08-18 DIAGNOSIS — Z888 Allergy status to other drugs, medicaments and biological substances status: Secondary | ICD-10-CM | POA: Diagnosis not present

## 2017-08-18 DIAGNOSIS — Z9889 Other specified postprocedural states: Secondary | ICD-10-CM | POA: Diagnosis not present

## 2017-08-18 HISTORY — DX: Cardiac arrhythmia, unspecified: I49.9

## 2017-08-18 LAB — CBC
HEMATOCRIT: 33.4 % — AB (ref 35.0–47.0)
Hemoglobin: 11.1 g/dL — ABNORMAL LOW (ref 12.0–16.0)
MCH: 28.6 pg (ref 26.0–34.0)
MCHC: 33.1 g/dL (ref 32.0–36.0)
MCV: 86.3 fL (ref 80.0–100.0)
PLATELETS: 268 10*3/uL (ref 150–440)
RBC: 3.87 MIL/uL (ref 3.80–5.20)
RDW: 13.8 % (ref 11.5–14.5)
WBC: 7.5 10*3/uL (ref 3.6–11.0)

## 2017-08-18 LAB — BASIC METABOLIC PANEL
Anion gap: 7 (ref 5–15)
BUN: 30 mg/dL — ABNORMAL HIGH (ref 6–20)
CALCIUM: 9.1 mg/dL (ref 8.9–10.3)
CO2: 27 mmol/L (ref 22–32)
CREATININE: 0.78 mg/dL (ref 0.44–1.00)
Chloride: 103 mmol/L (ref 101–111)
GFR calc Af Amer: >60 mL/min (ref >60)
Glucose, Bld: 193 mg/dL — ABNORMAL HIGH (ref 65–99)
Potassium: 4.6 mmol/L (ref 3.5–5.1)
Sodium: 137 mmol/L (ref 135–145)

## 2017-08-18 LAB — SURGICAL PCR SCREEN
MRSA, PCR: NEGATIVE
STAPHYLOCOCCUS AUREUS: NEGATIVE

## 2017-08-18 MED ORDER — CEFAZOLIN SODIUM-DEXTROSE 2-4 GM/100ML-% IV SOLN
2.0000 g | Freq: Once | INTRAVENOUS | Status: AC
  Administered 2017-08-19: 2 g via INTRAVENOUS

## 2017-08-18 NOTE — Patient Instructions (Signed)
Your procedure is scheduled on: Friday, August 19, 2017  Report to VASCULAR LAB IN THE MEDICAL MALL  ARRIVE AT 11:45 AS PER INSTRUCTED   Remember: Instructions that are not followed completely may result in serious medical risk, up to and including death, or upon the discretion of your surgeon and anesthesiologist your surgery may need to be rescheduled.     _X__ 1. Do not eat food after midnight the night before your procedure.                 No gum chewing or hard candies. You may drink clear liquids up to 2 hours                 before you are scheduled to arrive for your surgery- DO not drink clear                 liquids within 2 hours of the start of your surgery.                 Clear Liquids include:  water, apple juice without pulp, clear carbohydrate                 drink such as Clearfast of Gartorade, Black Coffee or Tea (Do not add                 anything to coffee or tea).     _X__ 2.  No Alcohol for 24 hours before or after surgery.   _X__ 3.  Do Not Smoke or use e-cigarettes For 24 Hours Prior to Your Surgery.                 Do not use any chewable tobacco products for at least 6 hours prior to                 surgery.  ____  4.  Bring all medications with you on the day of surgery if instructed.   ____  5.  Notify your doctor if there is any change in your medical condition      (cold, fever, infections).     Do not wear jewelry, make-up, hairpins, clips or nail polish. Do not wear lotions, powders, or perfumes. You may wear deodorant. Do not shave 48 hours prior to surgery. Men may shave face and neck. Do not bring valuables to the hospital.    [facility] is not responsible for any belongings or valuables.  Contacts, dentures or bridgework may not be worn into surgery. Leave your suitcase in the car. After surgery it may be brought to your room. For patients admitted to the hospital, discharge time is determined by your treatment  team.   Patients discharged the day of surgery will not be allowed to drive home.   Please read over the following fact sheets that you were given:   PREPARING FOR SURGERY                 MRSA: STOP THE SPREAD    __X__ Take these medicines the morning of surgery with A SIP OF WATER:    1. NORVASC  2.  ATENOLOL  3.  DOXYCYCLINE  4.  TRAMADOL OR TYLENOL IF NEED  5.  6.  ____ Fleet Enema (as directed)   __X__ Use CHG Soap as directed  ____ Use inhalers on the day of surgery  ____ Stop ZETIA AND GLIPIZIDE AS OF TODAY    ____ Stop Coumadin/Plavix/aspirin AS OF TODAY  ____ Stop Anti-inflammatories AS OF TODAY  ____ Stop supplements until after surgery.    ____ Bring C-Pap to the hospital.

## 2017-08-19 ENCOUNTER — Ambulatory Visit: Payer: Medicare Other | Admitting: Anesthesiology

## 2017-08-19 ENCOUNTER — Ambulatory Visit
Admission: RE | Admit: 2017-08-19 | Discharge: 2017-08-19 | Disposition: A | Discharge status: Home / Self Care | Payer: Medicare Other | Source: Ambulatory Visit | Attending: Vascular Surgery | Admitting: Vascular Surgery

## 2017-08-19 ENCOUNTER — Encounter
Admission: RE | Disposition: A | Discharge status: Home / Self Care | Payer: Self-pay | Source: Ambulatory Visit | Attending: Vascular Surgery

## 2017-08-19 DIAGNOSIS — Z86711 Personal history of pulmonary embolism: Secondary | ICD-10-CM | POA: Insufficient documentation

## 2017-08-19 DIAGNOSIS — E1152 Type 2 diabetes mellitus with diabetic peripheral angiopathy with gangrene: Secondary | ICD-10-CM | POA: Insufficient documentation

## 2017-08-19 DIAGNOSIS — Z951 Presence of aortocoronary bypass graft: Secondary | ICD-10-CM | POA: Insufficient documentation

## 2017-08-19 DIAGNOSIS — Z888 Allergy status to other drugs, medicaments and biological substances status: Secondary | ICD-10-CM | POA: Insufficient documentation

## 2017-08-19 DIAGNOSIS — I25708 Atherosclerosis of coronary artery bypass graft(s), unspecified, with other forms of angina pectoris: Secondary | ICD-10-CM | POA: Insufficient documentation

## 2017-08-19 DIAGNOSIS — Z885 Allergy status to narcotic agent status: Secondary | ICD-10-CM | POA: Insufficient documentation

## 2017-08-19 DIAGNOSIS — I70261 Atherosclerosis of native arteries of extremities with gangrene, right leg: Secondary | ICD-10-CM | POA: Insufficient documentation

## 2017-08-19 DIAGNOSIS — I5033 Acute on chronic diastolic (congestive) heart failure: Secondary | ICD-10-CM | POA: Insufficient documentation

## 2017-08-19 DIAGNOSIS — I110 Hypertensive heart disease with heart failure: Secondary | ICD-10-CM | POA: Insufficient documentation

## 2017-08-19 DIAGNOSIS — L97909 Non-pressure chronic ulcer of unspecified part of unspecified lower leg with unspecified severity: Secondary | ICD-10-CM

## 2017-08-19 DIAGNOSIS — E782 Mixed hyperlipidemia: Secondary | ICD-10-CM | POA: Insufficient documentation

## 2017-08-19 DIAGNOSIS — I480 Paroxysmal atrial fibrillation: Secondary | ICD-10-CM | POA: Insufficient documentation

## 2017-08-19 DIAGNOSIS — Z955 Presence of coronary angioplasty implant and graft: Secondary | ICD-10-CM | POA: Insufficient documentation

## 2017-08-19 DIAGNOSIS — Z9889 Other specified postprocedural states: Secondary | ICD-10-CM | POA: Insufficient documentation

## 2017-08-19 DIAGNOSIS — Z8249 Family history of ischemic heart disease and other diseases of the circulatory system: Secondary | ICD-10-CM | POA: Insufficient documentation

## 2017-08-19 DIAGNOSIS — Z881 Allergy status to other antibiotic agents status: Secondary | ICD-10-CM | POA: Insufficient documentation

## 2017-08-19 DIAGNOSIS — Z809 Family history of malignant neoplasm, unspecified: Secondary | ICD-10-CM | POA: Insufficient documentation

## 2017-08-19 DIAGNOSIS — L97919 Non-pressure chronic ulcer of unspecified part of right lower leg with unspecified severity: Secondary | ICD-10-CM | POA: Insufficient documentation

## 2017-08-19 DIAGNOSIS — E11621 Type 2 diabetes mellitus with foot ulcer: Secondary | ICD-10-CM | POA: Insufficient documentation

## 2017-08-19 DIAGNOSIS — I70299 Other atherosclerosis of native arteries of extremities, unspecified extremity: Secondary | ICD-10-CM

## 2017-08-19 HISTORY — PX: LOWER EXTREMITY ANGIOGRAPHY: CATH118251

## 2017-08-19 LAB — GLUCOSE, CAPILLARY
Glucose-Capillary: 156 mg/dL — ABNORMAL HIGH (ref 65–99)
Glucose-Capillary: 146 mg/dL — ABNORMAL HIGH (ref 65–99)

## 2017-08-19 LAB — PROTIME-INR
INR: 1.18
PROTHROMBIN TIME: 14.9 s (ref 11.4–15.2)

## 2017-08-19 SURGERY — LOWER EXTREMITY ANGIOGRAPHY
Anesthesia: General | Laterality: Right

## 2017-08-19 MED ORDER — EPHEDRINE SULFATE 50 MG/ML IJ SOLN
INTRAMUSCULAR | Status: DC | PRN
  Administered 2017-08-19 (×5): 10 mg via INTRAVENOUS

## 2017-08-19 MED ORDER — SODIUM CHLORIDE 0.9 % IV SOLN: 250.0000 mL | INTRAVENOUS | Status: DC | PRN

## 2017-08-19 MED ORDER — SODIUM CHLORIDE 0.9 % IJ SOLN
INTRAMUSCULAR | Status: AC
  Filled 2017-08-19: qty 50

## 2017-08-19 MED ORDER — DEXAMETHASONE SODIUM PHOSPHATE 10 MG/ML IJ SOLN
INTRAMUSCULAR | Status: AC
  Filled 2017-08-19: qty 1

## 2017-08-19 MED ORDER — SODIUM CHLORIDE 0.9% FLUSH: 3.0000 mL | INTRAVENOUS | Status: DC | PRN

## 2017-08-19 MED ORDER — CLOPIDOGREL BISULFATE 300 MG PO TABS
ORAL_TABLET | ORAL | Status: AC
  Filled 2017-08-19: qty 1

## 2017-08-19 MED ORDER — ONDANSETRON HCL 4 MG/2ML IJ SOLN: 4.0000 mg | Freq: Once | INTRAMUSCULAR | Status: DC | PRN

## 2017-08-19 MED ORDER — FAMOTIDINE 20 MG PO TABS: 40.0000 mg | ORAL_TABLET | ORAL | Status: DC | PRN

## 2017-08-19 MED ORDER — MIDAZOLAM HCL 2 MG/2ML IJ SOLN
INTRAMUSCULAR | Status: AC
  Filled 2017-08-19: qty 2

## 2017-08-19 MED ORDER — HEPARIN SODIUM (PORCINE) 1000 UNIT/ML IJ SOLN
INTRAMUSCULAR | Status: AC
Start: 2017-08-19 — End: 2017-08-19
  Filled 2017-08-19: qty 1

## 2017-08-19 MED ORDER — FENTANYL CITRATE (PF) 100 MCG/2ML IJ SOLN
INTRAMUSCULAR | Status: AC
  Filled 2017-08-19: qty 2

## 2017-08-19 MED ORDER — GLYCOPYRROLATE 0.2 MG/ML IJ SOLN
INTRAMUSCULAR | Status: AC
  Filled 2017-08-19: qty 1

## 2017-08-19 MED ORDER — PROPOFOL 10 MG/ML IV BOLUS
INTRAVENOUS | Status: AC
  Filled 2017-08-19: qty 20

## 2017-08-19 MED ORDER — SODIUM CHLORIDE 0.9 % IV SOLN
INTRAVENOUS | Status: DC
Start: 2017-08-19 — End: 2017-08-19

## 2017-08-19 MED ORDER — MORPHINE SULFATE (PF) 4 MG/ML IV SOLN: 2.0000 mg | INTRAVENOUS | Status: DC | PRN

## 2017-08-19 MED ORDER — FENTANYL CITRATE (PF) 100 MCG/2ML IJ SOLN
INTRAMUSCULAR | Status: DC | PRN
  Administered 2017-08-19 (×2): 25 ug via INTRAVENOUS

## 2017-08-19 MED ORDER — HYDRALAZINE HCL 20 MG/ML IJ SOLN
5.0000 mg | INTRAMUSCULAR | Status: DC | PRN
  Administered 2017-08-19: 5 mg via INTRAVENOUS

## 2017-08-19 MED ORDER — PROPOFOL 10 MG/ML IV BOLUS
INTRAVENOUS | Status: DC | PRN
  Administered 2017-08-19: 120 mg via INTRAVENOUS

## 2017-08-19 MED ORDER — LABETALOL HCL 5 MG/ML IV SOLN
10.0000 mg | INTRAVENOUS | Status: DC | PRN
  Administered 2017-08-19: 10 mg via INTRAVENOUS

## 2017-08-19 MED ORDER — LIDOCAINE HCL (PF) 2 % IJ SOLN
INTRAMUSCULAR | Status: AC
  Filled 2017-08-19: qty 10

## 2017-08-19 MED ORDER — ONDANSETRON HCL 4 MG/2ML IJ SOLN
INTRAMUSCULAR | Status: AC
  Filled 2017-08-19: qty 2

## 2017-08-19 MED ORDER — HYDROMORPHONE HCL 1 MG/ML IJ SOLN: 1.0000 mg | Freq: Once | INTRAMUSCULAR | Status: DC | PRN

## 2017-08-19 MED ORDER — FENTANYL CITRATE (PF) 100 MCG/2ML IJ SOLN: 25.0000 ug | INTRAMUSCULAR | Status: DC | PRN

## 2017-08-19 MED ORDER — ONDANSETRON HCL 4 MG/2ML IJ SOLN: 4.0000 mg | Freq: Four times a day (QID) | INTRAMUSCULAR | Status: DC | PRN

## 2017-08-19 MED ORDER — HYDRALAZINE HCL 20 MG/ML IJ SOLN
INTRAMUSCULAR | Status: AC
  Administered 2017-08-19: 5 mg via INTRAVENOUS
  Filled 2017-08-19: qty 1

## 2017-08-19 MED ORDER — MIDAZOLAM HCL 2 MG/2ML IJ SOLN
INTRAMUSCULAR | Status: DC | PRN
  Administered 2017-08-19: 2 mg via INTRAVENOUS

## 2017-08-19 MED ORDER — METHYLPREDNISOLONE SODIUM SUCC 125 MG IJ SOLR: 125.0000 mg | INTRAMUSCULAR | Status: DC | PRN

## 2017-08-19 MED ORDER — GLYCOPYRROLATE 0.2 MG/ML IJ SOLN
INTRAMUSCULAR | Status: DC | PRN
  Administered 2017-08-19: 0.2 mg via INTRAVENOUS

## 2017-08-19 MED ORDER — HEPARIN (PORCINE) IN NACL 2-0.9 UNIT/ML-% IJ SOLN
INTRAMUSCULAR | Status: AC
  Filled 2017-08-19: qty 1000

## 2017-08-19 MED ORDER — DEXAMETHASONE SODIUM PHOSPHATE 10 MG/ML IJ SOLN
INTRAMUSCULAR | Status: DC | PRN
  Administered 2017-08-19: 5 mg via INTRAVENOUS

## 2017-08-19 MED ORDER — HEPARIN SODIUM (PORCINE) 1000 UNIT/ML IJ SOLN
INTRAMUSCULAR | Status: DC | PRN
  Administered 2017-08-19: 7000 [IU] via INTRAVENOUS

## 2017-08-19 MED ORDER — IOPAMIDOL (ISOVUE-300) INJECTION 61%
INTRAVENOUS | Status: DC | PRN
  Administered 2017-08-19: 104 mL via INTRAVENOUS

## 2017-08-19 MED ORDER — NITROGLYCERIN 5 MG/ML IV SOLN
INTRAVENOUS | Status: AC
  Filled 2017-08-19: qty 10

## 2017-08-19 MED ORDER — LIDOCAINE HCL (PF) 1 % IJ SOLN
INTRAMUSCULAR | Status: AC
  Filled 2017-08-19: qty 30

## 2017-08-19 MED ORDER — SODIUM CHLORIDE 0.9 % IV SOLN
INTRAVENOUS | Status: DC
  Administered 2017-08-19 (×2): via INTRAVENOUS

## 2017-08-19 MED ORDER — EPHEDRINE SULFATE 50 MG/ML IJ SOLN
INTRAMUSCULAR | Status: AC
  Filled 2017-08-19: qty 1

## 2017-08-19 MED ORDER — LABETALOL HCL 5 MG/ML IV SOLN
INTRAVENOUS | Status: AC
  Filled 2017-08-19: qty 4

## 2017-08-19 MED ORDER — CLOPIDOGREL BISULFATE 75 MG PO TABS: 75.0000 mg | ORAL_TABLET | Freq: Every day | ORAL | 11 refills | Status: DC

## 2017-08-19 MED ORDER — CLOPIDOGREL BISULFATE 75 MG PO TABS
300.0000 mg | ORAL_TABLET | Freq: Once | ORAL | Status: AC
  Administered 2017-08-19: 300 mg via ORAL

## 2017-08-19 MED ORDER — SODIUM CHLORIDE 0.9% FLUSH: 3.0000 mL | Freq: Two times a day (BID) | INTRAVENOUS | Status: DC

## 2017-08-19 SURGICAL SUPPLY — 40 items
BALLN LUTONIX DCB 5X40X130 (BALLOONS) ×2
BALLN ULTRVRSE 1.5X40X150 (BALLOONS) ×2
BALLN ULTRVRSE 2X150X150 (BALLOONS) ×2
BALLN ULTRVRSE 2X300X150 (BALLOONS) ×2
BALLN ULTRVRSE 2X300X150 OTW (BALLOONS) ×1
BALLN ULTRVRSE 3X40X150 (BALLOONS) ×2
BALLN ULTRVRSE 4X40X130C (BALLOONS) ×2
BALLOON LUTONIX DCB 5X40X130 (BALLOONS) IMPLANT
BALLOON ULTRVRSE 1.5X40X150 (BALLOONS) IMPLANT
BALLOON ULTRVRSE 2X150X150 (BALLOONS) IMPLANT
BALLOON ULTRVRSE 2X300X150 OTW (BALLOONS) IMPLANT
BALLOON ULTRVRSE 3X40X150 (BALLOONS) IMPLANT
BALLOON ULTRVRSE 4X40X130C (BALLOONS) IMPLANT
CATH CROSSER S6 154CM (CATHETERS) ×1 IMPLANT
CATH CXI SUPP ANG 2.6FR 150CM (MICROCATHETER) ×1 IMPLANT
CATH PIG 70CM (CATHETERS) ×1 IMPLANT
CATH USHER TPER 130CM (CATHETERS) ×1 IMPLANT
CATH VERT 5FR 125CM (CATHETERS) ×1 IMPLANT
COVER PROBE U/S 5X48 (MISCELLANEOUS) ×1 IMPLANT
DEVICE PRESTO INFLATION (MISCELLANEOUS) ×1 IMPLANT
DEVICE STARCLOSE SE CLOSURE (Vascular Products) ×1 IMPLANT
DEVICE TORQUE (MISCELLANEOUS) ×1 IMPLANT
DRAPE BRACHIAL (DRAPES) ×1 IMPLANT
DRAPE INCISE IOBAN 66X45 STRL (DRAPES) ×1 IMPLANT
GUIDEWIRE LT ZIPWIRE 035X260 (WIRE) ×1 IMPLANT
GUIDEWIRE PFTE-COATED .018X300 (WIRE) ×1 IMPLANT
KIT FLOWMATE PROCEDURAL (MISCELLANEOUS) ×1 IMPLANT
NDL ENTRY 21GA 7CM ECHOTIP (NEEDLE) IMPLANT
NEEDLE ENTRY 21GA 7CM ECHOTIP (NEEDLE) ×2 IMPLANT
PACK ANGIOGRAPHY (CUSTOM PROCEDURE TRAY) ×2 IMPLANT
SET INTRO CAPELLA COAXIAL (SET/KITS/TRAYS/PACK) ×1 IMPLANT
SHEATH BRITE TIP 5FRX11 (SHEATH) ×1 IMPLANT
SHEATH RAABE 6FR (SHEATH) ×1 IMPLANT
SHIELD RADPAD SCOOP 12X17 (MISCELLANEOUS) ×2 IMPLANT
TOWEL OR 17X26 4PK STRL BLUE (TOWEL DISPOSABLE) ×1 IMPLANT
TUBING CONTRAST HIGH PRESS 72 (TUBING) ×1 IMPLANT
WIRE G V18X300CM (WIRE) ×1 IMPLANT
WIRE J 3MM .035X145CM (WIRE) ×1 IMPLANT
WIRE MAGIC TORQUE 315CM (WIRE) ×2 IMPLANT
WIRE SPARTACORE .014X300CM (WIRE) ×1 IMPLANT

## 2017-08-19 NOTE — Progress Notes (Signed)
Pt sitting up in bed eating and drinking w/o difficulty in NAD, husband at bedside, Dr. Gilda CreaseSchnier at bedside to discuss procedure and follow up.  Discharge, follow up and prescription instructions reviewed with pt and family who verbalize understanding.

## 2017-08-19 NOTE — Anesthesia Postprocedure Evaluation (Signed)
Anesthesia Post Note  Patient: [name]  Procedure(s) Performed: LOWER EXTREMITY ANGIOGRAPHY (Right )  Patient location during evaluation: PACU Anesthesia Type: General Level of consciousness: awake and alert and oriented Pain management: pain level controlled Vital Signs Assessment: post-procedure vital signs reviewed and stable Respiratory status: spontaneous breathing Cardiovascular status: blood pressure returned to baseline Anesthetic complications: no     Last Vitals:  Vitals:   08/19/17 1631 08/19/17 1643  BP: (!) 149/67 (!) 150/65  Pulse: (!) 56 (!) 58  Resp: 19 (!) 24  Temp:    SpO2: 94% 97%    Last Pain:  Vitals:   08/19/17 1643  TempSrc:   PainSc: 0-No pain                 Zameer Borman

## 2017-08-19 NOTE — Anesthesia Post-op Follow-up Note (Signed)
Anesthesia QCDR form completed.        

## 2017-08-19 NOTE — Anesthesia Preprocedure Evaluation (Signed)
Anesthesia Evaluation  Patient identified by MRN, date of birth, ID band  Reviewed: Allergy & Precautions, NPO status , Patient's Chart, lab work & pertinent test results  History of Anesthesia Complications Negative for: history of anesthetic complications  Airway Mallampati: I  TM Distance: >3 FB Neck ROM: Full    Dental  (+) Dental Advisory Given, Caps, Missing, Teeth Intact   Pulmonary neg pulmonary ROS,           Cardiovascular Exercise Tolerance: Good hypertension, Pt. on medications and Pt. on home beta blockers (-) angina+ CAD, + Cardiac Stents, + CABG, + Peripheral Vascular Disease and +CHF  (-) Past MI + dysrhythmias Atrial Fibrillation (-) Valvular Problems/Murmurs  EF 50-55%, valves OK   Neuro/Psych PSYCHIATRIC DISORDERS (Depression) negative neurological ROS     GI/Hepatic negative GI ROS, Neg liver ROS,   Endo/Other  diabetes, Oral Hypoglycemic Agents  Renal/GU negative Renal ROS  negative genitourinary   Musculoskeletal   Abdominal   Peds  Hematology  (+) Blood dyscrasia (Hb 10.8), ,   Anesthesia Other Findings Past Medical History: 01/24/2015: Acute on chronic diastolic CHF (congestive heart failure)  (HCC) No date: Anemia No date: Anxiety No date: Atrial fibrillation (HCC) No date: Complication of anesthesia     Comment:  difficult to induce sleep No date: Coronary artery disease     Comment:  Status post coronary stenting approximately 10 years ago No date: Depression No date: Hyperlipidemia No date: Hypertension No date: Peripheral artery disease (HCC) No date: Pulmonary embolism (HCC) No date: Type 2 diabetes mellitus (HCC)   Reproductive/Obstetrics negative OB ROS                             Anesthesia Physical  Anesthesia Plan  ASA: III  Anesthesia Plan: General   Post-op Pain Management:    Induction: Intravenous  PONV Risk Score and Plan: 3 and  Ondansetron, Dexamethasone and Midazolam  Airway Management Planned: LMA  Additional Equipment:   Intra-op Plan:   Post-operative Plan: Extubation in OR  Informed Consent: I have reviewed the patients History and Physical, chart, labs and discussed the procedure including the risks, benefits and alternatives for the proposed anesthesia with the patient or authorized representative who has indicated his/her understanding and acceptance.   Dental advisory given  Plan Discussed with: CRNA and Surgeon  Anesthesia Plan Comments:         Anesthesia Quick Evaluation  

## 2017-08-19 NOTE — H&P (Signed)
[facility] History & Physical Update  The patient was interviewed and re-examined.  The patient's previous History and Physical has been reviewed and is unchanged.  There is no change in the plan of care. We plan to proceed with the scheduled procedure.  Levora DredgeGregory Schnier, MD  08/19/2017, 12:36 PM

## 2017-08-19 NOTE — Discharge Instructions (Signed)
Femoral Site Care Refer to this sheet in the next few weeks. These instructions provide you with information about caring for yourself after your procedure. Your health care provider may also give you more specific instructions. Your treatment has been planned according to current medical practices, but problems sometimes occur. Call your health care provider if you have any problems or questions after your procedure. What can I expect after the procedure? After your procedure, it is typical to have the following:  Bruising at the site that usually fades within 1-2 weeks.  Blood collecting in the tissue (hematoma) that may be painful to the touch. It should usually decrease in size and tenderness within 1-2 weeks.  Follow these instructions at home:  Take medicines only as directed by your health care provider.  You may shower 24-48 hours after the procedure or as directed by your health care provider. Remove the bandage (dressing) and gently wash the site with plain soap and water. Pat the area dry with a clean towel. Do not rub the site, because this may cause bleeding.  Do not take baths, swim, or use a hot tub until your health care provider approves.  Check your insertion site every day for redness, swelling, or drainage.  Do not apply powder or lotion to the site.  Limit use of stairs to twice a day for the first 2-3 days or as directed by your health care provider.  Do not squat for the first 2-3 days or as directed by your health care provider.  Do not lift over 10 lb (4.5 kg) for 5 days after your procedure or as directed by your health care provider.  Ask your health care provider when it is okay to: ? Return to work or school. ? Resume usual physical activities or sports. ? Resume sexual activity.  Do not drive home if you are discharged the same day as the procedure. Have someone else drive you.  You may drive 24 hours after the procedure unless otherwise instructed by  your health care provider.  Do not operate machinery or power tools for 24 hours after the procedure or as directed by your health care provider.  If your procedure was done as an outpatient procedure, which means that you went home the same day as your procedure, a responsible adult should be with you for the first 24 hours after you arrive home.  Keep all follow-up visits as directed by your health care provider. This is important. Contact a health care provider if:  You have a fever.  You have chills.  You have increased bleeding from the site. Hold pressure on the site. Get help right away if:  You have unusual pain at the site.  You have redness, warmth, or swelling at the site.  You have drainage (other than a small amount of blood on the dressing) from the site.  The site is bleeding, and the bleeding does not stop after 30 minutes of holding steady pressure on the site.  Your leg or foot becomes pale, cool, tingly, or numb. This information is not intended to replace advice given to you by your health care provider. Make sure you discuss any questions you have with your health care provider. Document Released: 03/29/2014 Document Revised: 01/01/2016 Document Reviewed: 02/12/2014 Elsevier Interactive Patient Education  2018 [employer]. General Anesthesia, Adult, Care After These instructions provide you with information about caring for yourself after your procedure. Your health care provider may also give you more specific  instructions. Your treatment has been planned according to current medical practices, but problems sometimes occur. Call your health care provider if you have any problems or questions after your procedure. What can I expect after the procedure? After the procedure, it is common to have:  Vomiting.  A sore throat.  Mental slowness.  It is common to feel:  Nauseous.  Cold or shivery.  Sleepy.  Tired.  Sore or achy, even in parts of your  body where you did not have surgery.  Follow these instructions at home: For at least 24 hours after the procedure:  Do not: ? Participate in activities where you could fall or become injured. ? Drive. ? Use heavy machinery. ? Drink alcohol. ? Take sleeping pills or medicines that cause drowsiness. ? Make important decisions or sign legal documents. ? Take care of children on your own.  Rest. Eating and drinking  If you vomit, drink water, juice, or soup when you can drink without vomiting.  Drink enough fluid to keep your urine clear or pale yellow.  Make sure you have little or no nausea before eating solid foods.  Follow the diet recommended by your health care provider. General instructions  Have a responsible adult stay with you until you are awake and alert.  Return to your normal activities as told by your health care provider. Ask your health care provider what activities are safe for you.  Take over-the-counter and prescription medicines only as told by your health care provider.  If you smoke, do not smoke without supervision.  Keep all follow-up visits as told by your health care provider. This is important. Contact a health care provider if:  You continue to have nausea or vomiting at home, and medicines are not helpful.  You cannot drink fluids or start eating again.  You cannot urinate after 8-12 hours.  You develop a skin rash.  You have fever.  You have increasing redness at the site of your procedure. Get help right away if:  You have difficulty breathing.  You have chest pain.  You have unexpected bleeding.  You feel that you are having a life-threatening or urgent problem. This information is not intended to replace advice given to you by your health care provider. Make sure you discuss any questions you have with your health care provider. Document Released: 11/01/2000 Document Revised: 12/29/2015 Document Reviewed: 07/10/2015 Elsevier  Interactive Patient Education  [employer]2018 [employer]

## 2017-08-19 NOTE — Transfer of Care (Signed)
Immediate Anesthesia Transfer of Care Note  Patient: [name]  Procedure(s) Performed: LOWER EXTREMITY ANGIOGRAPHY (Right )  Patient Location: PACU  Anesthesia Type:General  Level of Consciousness: drowsy and patient cooperative  Airway & Oxygen Therapy: Patient Spontanous Breathing and Patient connected to face mask oxygen  Post-op Assessment: Report given to RN and Post -op Vital signs reviewed and stable  Post vital signs: Reviewed and stable  Last Vitals:  Vitals:   08/19/17 1207 08/19/17 1522  BP: (!) 171/52 (!) 173/84  Pulse: (!) 52 (!) 55  Resp: 16 20  Temp: 36.5 C   SpO2: 98%     Last Pain:  Vitals:   08/19/17 1207  TempSrc: Oral  PainSc: 0-No pain         Complications: No apparent anesthesia complications

## 2017-08-19 NOTE — Anesthesia Procedure Notes (Signed)
Procedure Name: LMA Insertion Date/Time: 08/19/2017 1:00 PM Performed by: Omer JackWeatherly, Clorine Swing, CRNA Pre-anesthesia Checklist: Patient identified, Patient being monitored, Timeout performed, Emergency Drugs available and Suction available Patient Re-evaluated:Patient Re-evaluated prior to induction Oxygen Delivery Method: Circle system utilized Preoxygenation: Pre-oxygenation with 100% oxygen Induction Type: IV induction Ventilation: Mask ventilation without difficulty LMA: LMA inserted LMA Size: 3.5 Tube type: Oral Number of attempts: 1 Placement Confirmation: positive ETCO2 and breath sounds checked- equal and bilateral Tube secured with: Tape Dental Injury: Teeth and Oropharynx as per pre-operative assessment

## 2017-08-19 NOTE — Op Note (Signed)
[facility] Percutaneous Study/Intervention Procedural Note   Date of Surgery: 08/19/2017  Surgeon:  Katha Cabal, MD.  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 Introduction catheter into right lower extremity 3rd order catheter placement  2. Contrast injection right lower extremity for distal runoff with additional third order catheter placement   3. Crosser atherectomy of the right posterior tibial artery 4. Percutaneous transluminal angioplasty right posterior tibial artery with a 1.5, 2.0, 2.5 mm balloon             5.  Percutaneous transluminal angioplasty of the SFA and popliteal arteries with a 5 mm Lutonix drug-eluting balloon                      6.   Percutaneous transluminal angioplasty right peroneal artery to 3 mm             7.  Star close closure left common femoral arteriotomy                Anesthesia: General anesthesia  Sheath: 6 French Raby left common femoral artery  Contrast: 104 cc  Fluoroscopy Time: 21.8 minutes  Indications: Gwenlyn Perking Bayne presents with atherosclerotic occlusive disease bilateral lower extremities. The patient has developed ulceration and gangrene of the soft tissues of the right lower extremity. This places the patient at high risk for limb loss and amputation. The risks and benefits are reviewed all questions answered patient agrees to proceed.  Procedure: TANNER VIGNA is a 67 y.o. y.o. female who was identified and appropriate procedural time out was performed. The patient was then placed supine on the table and prepped and draped in the usual sterile fashion.   Ultrasound was placed in the sterile sleeve and the left groin was evaluated the left common femoral artery was echolucent and  pulsatile indicating patency.  Image was recorded for the permanent record and under real-time visualization a microneedle was inserted into the common femoral artery microwire followed by a micro-sheath.  A J-wire was then advanced through the micro-sheath and a  5 [place] sheath was then inserted over a J-wire. J-wire was then advanced and a 5 French pigtail catheter was positioned at the level of T12. AP projection of the aorta was then obtained. Pigtail catheter was repositioned to above the bifurcation and a LAO view of the pelvis was obtained.  Subsequently a pigtail catheter with the stiff angle Glidewire was used to cross the aortic bifurcation the catheter wire were advanced down into the right distal external iliac artery. Oblique view of the right femoral bifurcation was then obtained and subsequently the wire was reintroduced and the pigtail catheter negotiated into the SFA representing third order catheter placement. Distal runoff was then performed.  7000 units of heparin was then given and allowed to circulate and a 6 [place] Raby sheath was advanced up and over the bifurcation and positioned in the common femoral artery.  Wire and catheter were then used to negotiate the SFA stenosis and the KMP catheter was then positioned in the distal popliteal where magnified imaging of the trifurcation was performed.  The S6 Crosser catheter was then prepped on the field and a straight Usher catheter was advanced into the cul-de-sac of the posterior tibial under magnified imaging. Using the Crosser catheter the occlusion of the posterior tibial was negotiated down to the  lateral plantar.  A 0.018 advantage wire was then negotiated into the lateral plantar artery followed by a 0.018 CXI catheter.  Hand-injection of contrast was then performed.  This injection of contrast confirmed intraluminal positioning.  0.014 [place] core wire was then advanced through the CXI catheter and the catheter removed.  Initially  a 2.5 x 300 mm Ultraverse balloon was used to dilate the posterior tibial artery from the level of the ankle proximally.  Subsequently a 1.5 mm x 40 mm Ultraverse balloon was used angioplasty from the lateral plantar proximally 2 separate inflations were performed both inflations were to 12 atm for 1 minute.  Subsequently a 2.0 x 150 millimeter Ultraverse balloon was used to angioplasty the distal one third of the posterior tibial.  Follow-up imaging now demonstrated in-line flow from the tibioperoneal trunk to the lateral plantar filling the pedal arch.  The CXI catheter was reintroduced and the 018 advantage wire negotiated into the peroneal.  Hand-injection of contrast was used to verify intraluminal placement and complete distal runoff.  A 3 x 40 mm Ultraverse balloon was then used to angioplasty the proximal peroneal artery. Inflation was to 10-12 atm for 1 full minute. Follow-up imaging demonstrated patency of the tibial artery and the SFA and popliteal were now addressed  A 4 mm by 40 Ultraverse balloon was used to angioplasty the superficial femoral artery. Inflation was to 12 atmospheres for 1 minutes. Follow-up imaging demonstrated patency with adequate preparation of the vessel for a drug-coated balloon.  Subsequently, a 5 mm x 40 mm Lutonix drug-eluting balloon was utilized inflating to 12 atm for 2 full minutes. Follow-up imaging demonstrated less than 5% residual stenosis within the SFA.  Distal runoff was then reassessed and noted to be preserved and widely patent.  After review of these images the sheath is pulled into the left external iliac oblique of the common femoral is obtained and a Star close device deployed. There no immediate Complications.  Findings: The abdominal aorta is opacified with a bolus injection contrast. Renal arteries are single and widely patent. The aorta itself has diffuse disease but no hemodynamically significant lesions. The common and external iliac arteries are  widely patent bilaterally.  The right common femoral is widely patent as is the profunda femoris.  The SFA does indeed have a significant stenosis in its midportion over a short segment.  This area represents a 70-75% diameter reduction.  The popliteal is patent without evidence of hemodynamically significant stenosis. T anterior tibial he trifurcation is heavily diseased with occlusion of the and posterior tibial.  There is a 90% stenosis at the origin of the peroneal.  Tibioperoneal trunk appears well preserved..   Following Crosser atherectomy with angioplasty posterior tibial now is widely patent in-line flow and looks quite nice with less than 5% residual stenosis.  Following angioplasty to 3 mm of the peroneal it is now widely patent with less than 5% residual stenosis.  Angioplasty of the SFA with a Lutonix balloon shows an excellent result with less than 10% residual stenosis.   Disposition: Patient was taken to the recovery room in stable condition having tolerated the procedure well.  Belenda Cruise Dhruva Orndoff 08/19/2017,5:08 PM

## 2017-08-22 ENCOUNTER — Encounter: Payer: Self-pay | Admitting: Vascular Surgery

## 2017-08-24 ENCOUNTER — Ambulatory Visit (INDEPENDENT_AMBULATORY_CARE_PROVIDER_SITE_OTHER): Payer: TRICARE For Life (TFL) | Admitting: Vascular Surgery

## 2017-08-24 ENCOUNTER — Encounter (INDEPENDENT_AMBULATORY_CARE_PROVIDER_SITE_OTHER): Payer: TRICARE For Life (TFL)

## 2017-08-24 ENCOUNTER — Encounter (INDEPENDENT_AMBULATORY_CARE_PROVIDER_SITE_OTHER): Payer: Medicare Other

## 2017-08-24 ENCOUNTER — Ambulatory Visit (INDEPENDENT_AMBULATORY_CARE_PROVIDER_SITE_OTHER): Payer: Medicare Other

## 2017-08-24 DIAGNOSIS — I480 Paroxysmal atrial fibrillation: Secondary | ICD-10-CM | POA: Diagnosis not present

## 2017-08-24 DIAGNOSIS — Z5181 Encounter for therapeutic drug level monitoring: Secondary | ICD-10-CM | POA: Diagnosis not present

## 2017-08-24 DIAGNOSIS — I2699 Other pulmonary embolism without acute cor pulmonale: Secondary | ICD-10-CM

## 2017-08-24 LAB — POCT INR: INR: 1.8

## 2017-08-24 NOTE — Patient Instructions (Signed)
Please take extra 1/2 dose tonight, then resume previous dose of 7.5 mg daily. Recheck in 2 weeks.

## 2017-08-26 ENCOUNTER — Telehealth (INDEPENDENT_AMBULATORY_CARE_PROVIDER_SITE_OTHER): Payer: Self-pay | Admitting: Vascular Surgery

## 2017-08-26 NOTE — Telephone Encounter (Signed)
Procedure note has been faxed over for 08/19/17

## 2017-08-26 NOTE — Telephone Encounter (Signed)
New Message  [facility]obby [facility] from procedure date 1.11.19 faxed to [phone] Attn: Bobby in regards to Angio Gram Plasty

## 2017-08-29 ENCOUNTER — Ambulatory Visit (INDEPENDENT_AMBULATORY_CARE_PROVIDER_SITE_OTHER): Payer: Medicare Other | Admitting: Vascular Surgery

## 2017-09-05 ENCOUNTER — Ambulatory Visit (INDEPENDENT_AMBULATORY_CARE_PROVIDER_SITE_OTHER): Payer: Medicare Other

## 2017-09-05 DIAGNOSIS — Z5181 Encounter for therapeutic drug level monitoring: Secondary | ICD-10-CM

## 2017-09-05 DIAGNOSIS — I2699 Other pulmonary embolism without acute cor pulmonale: Secondary | ICD-10-CM | POA: Diagnosis not present

## 2017-09-05 DIAGNOSIS — I480 Paroxysmal atrial fibrillation: Secondary | ICD-10-CM | POA: Diagnosis not present

## 2017-09-05 LAB — POCT INR: INR: 1.8

## 2017-09-05 NOTE — Patient Instructions (Signed)
Please start new dosage of 1.5 tablets every day except 2 tablets on Mondays and Fridays. Recheck in 2 weeks.

## 2017-09-12 ENCOUNTER — Other Ambulatory Visit: Payer: Self-pay | Admitting: Surgery

## 2017-09-28 ENCOUNTER — Ambulatory Visit (INDEPENDENT_AMBULATORY_CARE_PROVIDER_SITE_OTHER): Payer: Medicare Other

## 2017-09-28 DIAGNOSIS — Z5181 Encounter for therapeutic drug level monitoring: Secondary | ICD-10-CM | POA: Diagnosis not present

## 2017-09-28 DIAGNOSIS — I2699 Other pulmonary embolism without acute cor pulmonale: Secondary | ICD-10-CM | POA: Diagnosis not present

## 2017-09-28 DIAGNOSIS — I480 Paroxysmal atrial fibrillation: Secondary | ICD-10-CM | POA: Diagnosis not present

## 2017-09-28 LAB — POCT INR: INR: 1.5

## 2017-09-28 NOTE — Patient Instructions (Signed)
Please take 2 tablets today and tomorrow, then start new dosage of 1.5 tablets every day except 2 tablets on Mondays, Wednesdays and Fridays. Recheck in 2 weeks.

## 2017-09-28 NOTE — Progress Notes (Signed)
[name]

## 2017-10-12 ENCOUNTER — Ambulatory Visit (INDEPENDENT_AMBULATORY_CARE_PROVIDER_SITE_OTHER): Payer: Medicare Other

## 2017-10-12 DIAGNOSIS — I480 Paroxysmal atrial fibrillation: Secondary | ICD-10-CM | POA: Diagnosis not present

## 2017-10-12 DIAGNOSIS — Z5181 Encounter for therapeutic drug level monitoring: Secondary | ICD-10-CM

## 2017-10-12 DIAGNOSIS — I2699 Other pulmonary embolism without acute cor pulmonale: Secondary | ICD-10-CM

## 2017-10-12 LAB — POCT INR: INR: 4.3

## 2017-10-12 NOTE — Patient Instructions (Signed)
Please hold coumadin tonight, then resume dosage of 1.5 tablets every day except 2 tablets on Mondays, Wednesdays and Fridays. Please be consistent with your greens intake.  Recheck in 2 weeks.

## 2017-10-24 ENCOUNTER — Ambulatory Visit (INDEPENDENT_AMBULATORY_CARE_PROVIDER_SITE_OTHER): Payer: Medicare Other | Admitting: Vascular Surgery

## 2017-10-24 ENCOUNTER — Encounter (INDEPENDENT_AMBULATORY_CARE_PROVIDER_SITE_OTHER): Payer: Self-pay | Admitting: Vascular Surgery

## 2017-10-24 VITALS — BP 159/72 | HR 60 | Resp 16 | Wt 175.0 lb

## 2017-10-24 DIAGNOSIS — L03031 Cellulitis of right toe: Secondary | ICD-10-CM

## 2017-10-24 DIAGNOSIS — I10 Essential (primary) hypertension: Secondary | ICD-10-CM

## 2017-10-24 DIAGNOSIS — I70299 Other atherosclerosis of native arteries of extremities, unspecified extremity: Secondary | ICD-10-CM | POA: Diagnosis not present

## 2017-10-24 DIAGNOSIS — E1159 Type 2 diabetes mellitus with other circulatory complications: Secondary | ICD-10-CM

## 2017-10-24 DIAGNOSIS — L97909 Non-pressure chronic ulcer of unspecified part of unspecified lower leg with unspecified severity: Secondary | ICD-10-CM

## 2017-10-24 DIAGNOSIS — I25708 Atherosclerosis of coronary artery bypass graft(s), unspecified, with other forms of angina pectoris: Secondary | ICD-10-CM
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
## 2017-10-25 ENCOUNTER — Ambulatory Visit
Admission: RE | Admit: 2017-10-25 | Discharge: 2017-10-25 | Disposition: A | Discharge status: Home / Self Care | Payer: Medicare Other | Source: Ambulatory Visit | Attending: Vascular Surgery | Admitting: Vascular Surgery

## 2017-10-25 DIAGNOSIS — I70299 Other atherosclerosis of native arteries of extremities, unspecified extremity: Secondary | ICD-10-CM | POA: Diagnosis present

## 2017-10-25 DIAGNOSIS — S92351A Displaced fracture of fifth metatarsal bone, right foot, initial encounter for closed fracture: Secondary | ICD-10-CM | POA: Diagnosis not present

## 2017-10-25 DIAGNOSIS — R9389 Abnormal findings on diagnostic imaging of other specified body structures: Secondary | ICD-10-CM | POA: Insufficient documentation

## 2017-10-25 DIAGNOSIS — L03031 Cellulitis of right toe: Secondary | ICD-10-CM

## 2017-10-25 DIAGNOSIS — L97909 Non-pressure chronic ulcer of unspecified part of unspecified lower leg with unspecified severity: Secondary | ICD-10-CM | POA: Diagnosis present

## 2017-10-25 DIAGNOSIS — X58XXXA Exposure to other specified factors, initial encounter: Secondary | ICD-10-CM | POA: Insufficient documentation

## 2017-10-26 ENCOUNTER — Telehealth (INDEPENDENT_AMBULATORY_CARE_PROVIDER_SITE_OTHER): Payer: Self-pay | Admitting: Vascular Surgery

## 2017-10-26 NOTE — Telephone Encounter (Signed)
I spoke with the patient and inform her I will speak with GS when he comes in the morning about receiving pain meds and about the x rays

## 2017-10-27 ENCOUNTER — Ambulatory Visit (INDEPENDENT_AMBULATORY_CARE_PROVIDER_SITE_OTHER): Payer: Medicare Other | Admitting: Vascular Surgery

## 2017-10-27 ENCOUNTER — Telehealth (INDEPENDENT_AMBULATORY_CARE_PROVIDER_SITE_OTHER): Payer: Self-pay | Admitting: Vascular Surgery

## 2017-10-27 NOTE — Telephone Encounter (Signed)
Attempted to call the patient regarding to the prior message left to inform her GS will be giving her call today about xrays results and regarding to the pain meds she should reach out to Dr Judithann SheenSparks I will be recalling the patient because I was not able to leave a message

## 2017-10-27 NOTE — Telephone Encounter (Signed)
I did speak to the patient when she returned the miss call

## 2017-10-31 ENCOUNTER — Telehealth (INDEPENDENT_AMBULATORY_CARE_PROVIDER_SITE_OTHER): Payer: Self-pay | Admitting: Vascular Surgery

## 2017-10-31 NOTE — Telephone Encounter (Signed)
I spoke with the patient by phone.  I have reviewed the findings on the right foot x-rays.  I discussed with her the possibility of osteomyelitis.  I did ask if she has heard from Dr. Zachery DauerBarnes.  That culture was performed on 10/21/2017.  Unfortunately since it was [facility] at [facility] it is not information that is available in our epic system.  At this point no one from the office is gotten back with her and she is not on antibiotics.  She is continuing to have significant pain.  She did get a prescription for tramadol filled from Dr. Alberteen Spindleline on Friday.  I have asked that she follow-up with Dr. Zachery DauerBarnes so that we can get the culture results and initiate appropriate antibiotic therapy and she agrees with this.

## 2017-11-04 ENCOUNTER — Telehealth (INDEPENDENT_AMBULATORY_CARE_PROVIDER_SITE_OTHER): Payer: Self-pay | Admitting: Vascular Surgery

## 2017-11-04 NOTE — Telephone Encounter (Signed)
Patient called because she said her culture on her right foot came back positive. Her concern is that she has not been put on any antibotics and she said that her foot is sore and she cant put any weight on it.

## 2017-11-04 NOTE — Telephone Encounter (Signed)
Please refer to Dr. Gilda CreaseSchnier is October 31, 2017 telephone note.

## 2017-11-04 NOTE — Telephone Encounter (Signed)
Dr. Tania AdeWeeks approved for the patient to start on Augmentin 500 mg three times a day, and her appointment with their office on Monday. The patient is not confident that the dosage is going to be strong enough and wants to know if this is an effective amount of antibiotic?  Her Diagnosis from the culture read as follows:  Facelis of the right foot (moderate) per the culture that was taken.

## 2017-11-07 NOTE — Telephone Encounter (Signed)
Please tell the patient I reviewed her concerns with Dr. Gilda CreaseSchnier.  The dosage of Augmentin that she is on is usually a higher dose than what is prescribed normally.  We highly recommend she follow-up with the wound center for continued monitoring of her wound/infection status.  If the patient likes, I would be happy to refer her to Dr. Sampson GoonFitzgerald he is an infectious disease physician who our office works closely with.

## 2017-11-08 NOTE — Telephone Encounter (Signed)
Spoke to the patient today, and she said that she would like to move forward with the referral to Dr. Sampson GoonFitzgerald please.

## 2017-11-09 ENCOUNTER — Other Ambulatory Visit (INDEPENDENT_AMBULATORY_CARE_PROVIDER_SITE_OTHER): Payer: Self-pay | Admitting: Vascular Surgery

## 2017-11-09 ENCOUNTER — Ambulatory Visit (INDEPENDENT_AMBULATORY_CARE_PROVIDER_SITE_OTHER): Payer: Medicare Other

## 2017-11-09 DIAGNOSIS — I2699 Other pulmonary embolism without acute cor pulmonale: Secondary | ICD-10-CM

## 2017-11-09 DIAGNOSIS — Z5181 Encounter for therapeutic drug level monitoring: Secondary | ICD-10-CM | POA: Diagnosis not present

## 2017-11-09 DIAGNOSIS — I739 Peripheral vascular disease, unspecified: Secondary | ICD-10-CM

## 2017-11-09 DIAGNOSIS — I480 Paroxysmal atrial fibrillation: Secondary | ICD-10-CM

## 2017-11-09 LAB — POCT INR: INR: 1.2

## 2017-11-09 NOTE — Progress Notes (Unsigned)
rf

## 2017-11-09 NOTE — Telephone Encounter (Signed)
The referral has been sent.

## 2017-11-09 NOTE — Patient Instructions (Signed)
Please take 2.5 tablets tonight, 2 tablets tomorrow, then resume dosage of 1.5 tablets every day except 2 tablets on Mondays, Wednesdays and Fridays. Please be consistent with your greens intake.  Recheck in 2 weeks.

## 2017-11-14 ENCOUNTER — Telehealth (INDEPENDENT_AMBULATORY_CARE_PROVIDER_SITE_OTHER): Payer: Self-pay | Admitting: Vascular Surgery

## 2017-11-14 NOTE — Telephone Encounter (Signed)
I spoke with Dr. Zachery DauerBarnes by phone.  We reviewed her culture results as well as the x-ray result.  She is now also had an MRI which he ordered.  He did note that her insurance makes it difficult to get MRIs in the [facility] so [place] in [place].  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  I discussed with Dr. Zachery DauerBarnes my conversation with Dr. Linus Galasodd Cline last quality so we will pursue surgical options.  Discussion of the culture results shows Augmentin to be a good choice.  We will continue to treat as planned and she will follow-up in the office as arranged.
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
## 2017-11-21 ENCOUNTER — Ambulatory Visit (INDEPENDENT_AMBULATORY_CARE_PROVIDER_SITE_OTHER): Payer: Medicare Other | Admitting: Cardiovascular Disease

## 2017-11-21 ENCOUNTER — Encounter: Payer: Self-pay | Admitting: Cardiovascular Disease

## 2017-11-21 VITALS — BP 158/70 | HR 56 | Ht 68.0 in | Wt 172.8 lb

## 2017-11-21 DIAGNOSIS — Z951 Presence of aortocoronary bypass graft: Secondary | ICD-10-CM

## 2017-11-21 DIAGNOSIS — E782 Mixed hyperlipidemia: Secondary | ICD-10-CM | POA: Diagnosis not present

## 2017-11-21 DIAGNOSIS — I70299 Other atherosclerosis of native arteries of extremities, unspecified extremity: Secondary | ICD-10-CM | POA: Diagnosis not present

## 2017-11-21 DIAGNOSIS — E1159 Type 2 diabetes mellitus with other circulatory complications: Secondary | ICD-10-CM | POA: Diagnosis not present

## 2017-11-21 DIAGNOSIS — I480 Paroxysmal atrial fibrillation: Secondary | ICD-10-CM

## 2017-11-21 DIAGNOSIS — L97909 Non-pressure chronic ulcer of unspecified part of unspecified lower leg with unspecified severity: Secondary | ICD-10-CM

## 2017-11-21 DIAGNOSIS — I25708 Atherosclerosis of coronary artery bypass graft(s), unspecified, with other forms of angina pectoris: Secondary | ICD-10-CM | POA: Diagnosis not present

## 2017-11-21 DIAGNOSIS — I2699 Other pulmonary embolism without acute cor pulmonale: Secondary | ICD-10-CM | POA: Diagnosis not present

## 2017-11-21 NOTE — Patient Instructions (Addendum)
Medication Instructions:   No medication changes made  Labwork:  We will order liver/ lipids/ & Hemoglobin A1C today  Testing/Procedures:  No further testing at this time  Follow-Up: It was a pleasure seeing you in the office today. Please call us if you have new issues that need to be addressed before your next appt.  [phone]  Your physician wants you to follow-up in: 12 months.  You will receive a reminder letter in the mail two months in advance. If you don't receive a letter, please call our office to schedule the follow-up appointment.  If you need a refill on your cardiac medications before your next appointment, please call your pharmacy.  For educational health videos Log in to : [email or web address] Or : [email or web address], password : [email or web address]

## 2017-11-22 ENCOUNTER — Telehealth: Payer: Self-pay | Admitting: Cardiovascular Disease

## 2017-11-22 LAB — HEPATIC FUNCTION PANEL
ALBUMIN: 3.9 g/dL (ref 3.6–4.8)
ALK PHOS: 172 IU/L — AB (ref 39–117)
ALT: 69 IU/L — ABNORMAL HIGH (ref 0–32)
AST: 66 IU/L — ABNORMAL HIGH (ref 0–40)
BILIRUBIN, DIRECT: 0.12 mg/dL (ref 0.00–0.40)
Bilirubin Total: 0.3 mg/dL (ref 0.0–1.2)
TOTAL PROTEIN: 6.8 g/dL (ref 6.0–8.5)

## 2017-11-22 LAB — LIPID PANEL
Chol/HDL Ratio: 3.1 ratio (ref 0.0–4.4)
Cholesterol, Total: 167 mg/dL (ref 100–199)
HDL: 54 mg/dL (ref >39)
LDL Calculated: 96 mg/dL (ref 0–99)
Triglycerides: 86 mg/dL (ref 0–149)
VLDL Cholesterol Cal: 17 mg/dL (ref 5–40)

## 2017-11-22 LAB — HEMOGLOBIN A1C
Est. average glucose Bld gHb Est-mCnc: 197 mg/dL
Hgb A1c MFr Bld: 8.5 % — ABNORMAL HIGH (ref 4.8–5.6)

## 2017-11-22 NOTE — Telephone Encounter (Signed)
Patient calling for hgb a1c results from yesterday

## 2017-11-22 NOTE — Telephone Encounter (Signed)
Patient verbalized understanding of HgbA1c results and to follow up with PCP re: management of her diabetes.

## 2017-11-23 ENCOUNTER — Telehealth: Payer: Self-pay | Admitting: *Deleted

## 2017-11-23 ENCOUNTER — Ambulatory Visit (INDEPENDENT_AMBULATORY_CARE_PROVIDER_SITE_OTHER): Payer: Medicare Other

## 2017-11-23 DIAGNOSIS — Z5181 Encounter for therapeutic drug level monitoring: Secondary | ICD-10-CM

## 2017-11-23 DIAGNOSIS — I480 Paroxysmal atrial fibrillation: Secondary | ICD-10-CM | POA: Diagnosis not present

## 2017-11-23 DIAGNOSIS — I25810 Atherosclerosis of coronary artery bypass graft(s) without angina pectoris: Secondary | ICD-10-CM

## 2017-11-23 DIAGNOSIS — I2699 Other pulmonary embolism without acute cor pulmonale: Secondary | ICD-10-CM

## 2017-11-23 LAB — POCT INR: INR: 4.3

## 2017-11-23 NOTE — Telephone Encounter (Signed)
-----   Message from Antonieta Ibaimothy J Gollan, MD sent at 11/23/2017  2:37 PM EDT ----- Cholesterol is high Would add Zetia 10 mg daily with her Crestor Liver numbers are mildly elevated we will need to monitor this closely Would repeat LFTs in 3 months time

## 2017-11-23 NOTE — Patient Instructions (Signed)
Please skip coumadin tonight, take 1 tablet tomorrow, then resume dosage of 1.5 tablets every day except 2 tablets on Mondays, Wednesdays and Fridays.  Please be consistent with your greens intake.  Recheck in 2 weeks.

## 2017-11-23 NOTE — Telephone Encounter (Signed)
Spoke with patient and she reports that she has tried Zetia before and it caused her to have diarrhea. Reviewed Dr. Windell HummingbirdGollan's recommendations and she was agreeable with the plan but just not able to take the Zetia. Advised that I would check with Dr. Mariah MillingGollan on any other medication and then I would have scheduling to call her and arrange her labs to be done. She verbalized understanding with no further questions at this time.

## 2017-11-24 NOTE — Telephone Encounter (Signed)
Will schedule when we have schedule out.  At the moment we do not have that out.

## 2017-11-25 NOTE — Telephone Encounter (Signed)
Can we see if she would take repatha or praluent We need LDL much lower If she is interested, would refer to pharmacy

## 2017-11-25 NOTE — Telephone Encounter (Signed)
Spoke with patient and reviewed Dr. Windell HummingbirdGollan's recommendations and she was agreeable to consider Repatha/Praluent therapy. Advised that I would forward her chart over to our pharmacy team for them to review and then we would be in touch. She was agreeable with this plan, verbalized understanding of our conversation, appreciative for the call back and no further questions at this time.

## 2017-11-29 ENCOUNTER — Other Ambulatory Visit: Payer: Self-pay | Admitting: Cardiovascular Disease

## 2017-11-29 NOTE — Telephone Encounter (Signed)
Have we tried increasing her crestor to 20mg  daily? I see that she was intolerant to 40mg  daily. If she has not tolerated 20mg  daily she should qualify for PCSK9i therapy.

## 2017-11-29 NOTE — Telephone Encounter (Signed)
Please review for refill. Thanks!  

## 2017-11-30 ENCOUNTER — Ambulatory Visit
Admission: RE | Admit: 2017-11-30 | Discharge: 2017-11-30 | Disposition: A | Discharge status: Home / Self Care | Payer: Self-pay | Source: Ambulatory Visit | Attending: Infectious Diseases | Admitting: Infectious Diseases

## 2017-11-30 ENCOUNTER — Ambulatory Visit
Admission: RE | Admit: 2017-11-30 | Discharge: 2017-11-30 | Disposition: A | Discharge status: Home / Self Care | Payer: Medicare Other | Source: Ambulatory Visit | Attending: *Deleted | Admitting: *Deleted

## 2017-11-30 DIAGNOSIS — E1169 Type 2 diabetes mellitus with other specified complication: Secondary | ICD-10-CM | POA: Insufficient documentation

## 2017-11-30 DIAGNOSIS — M869 Osteomyelitis, unspecified: Secondary | ICD-10-CM | POA: Diagnosis not present

## 2017-11-30 MED ORDER — ONDANSETRON 4 MG PO TBDP
4.0000 mg | ORAL_TABLET | Freq: Once | ORAL | Status: AC
  Administered 2017-11-30: 4 mg via ORAL

## 2017-11-30 MED ORDER — SODIUM CHLORIDE 0.9% FLUSH
10.0000 mL | Freq: Two times a day (BID) | INTRAVENOUS | Status: DC
  Administered 2017-11-30: 10 mL

## 2017-11-30 MED ORDER — HEPARIN SOD (PORK) LOCK FLUSH 100 UNIT/ML IV SOLN
INTRAVENOUS | Status: AC
  Administered 2017-11-30: 250 [IU]
  Filled 2017-11-30: qty 5

## 2017-11-30 MED ORDER — SODIUM CHLORIDE FLUSH 0.9 % IV SOLN
INTRAVENOUS | Status: AC
  Administered 2017-11-30: 10 mL
  Filled 2017-11-30: qty 10

## 2017-11-30 MED ORDER — SODIUM CHLORIDE 0.9% FLUSH: 10.0000 mL | INTRAVENOUS | Status: DC | PRN

## 2017-11-30 MED ORDER — HEPARIN SOD (PORK) LOCK FLUSH 100 UNIT/ML IV SOLN
250.0000 [IU] | INTRAVENOUS | Status: AC | PRN
  Administered 2017-11-30: 250 [IU]

## 2017-11-30 MED ORDER — ONDANSETRON 4 MG PO TBDP
ORAL_TABLET | ORAL | Status: AC
  Filled 2017-11-30: qty 1

## 2017-11-30 MED ORDER — SODIUM CHLORIDE FLUSH 0.9 % IV SOLN
INTRAVENOUS | Status: AC
  Filled 2017-11-30: qty 10

## 2017-11-30 MED ORDER — SODIUM CHLORIDE 0.9 % IV SOLN
3.0000 g | Freq: Once | INTRAVENOUS | Status: AC
  Administered 2017-11-30: 3 g via INTRAVENOUS
  Filled 2017-11-30: qty 3

## 2017-11-30 NOTE — Progress Notes (Signed)
Peripherally Inserted Central Catheter/Midline Placement  The IV Nurse has discussed with the patient and/or persons authorized to consent for the patient, the purpose of this procedure and the potential benefits and risks involved with this procedure.  The benefits include less needle sticks, lab draws from the catheter, and the patient may be discharged home with the catheter. Risks include, but not limited to, infection, bleeding, blood clot (thrombus formation), and puncture of an artery; nerve damage and irregular heartbeat and possibility to perform a PICC exchange if needed/ordered by physician.  Alternatives to this procedure were also discussed.  Bard Power PICC patient education guide, fact sheet on infection prevention and patient information card has been provided to patient /or left at bedside.    PICC/Midline Placement Documentation  PICC Single Lumen 11/30/17 PICC Right Basilic 38 cm 1 cm (Active)       Franne GripNewman, Haygen Zebrowski Renee 11/30/2017, 2:44 PM

## 2017-11-30 NOTE — Telephone Encounter (Signed)
Would call and see is willing to try crestor 20 with zetia

## 2017-12-01 NOTE — Telephone Encounter (Signed)
Patient home but busy at this time. She will call us back later.

## 2017-12-05 MED ORDER — ROSUVASTATIN CALCIUM 20 MG PO TABS: 20.0000 mg | ORAL_TABLET | Freq: Every day | ORAL | 3 refills | Status: DC

## 2017-12-05 NOTE — Addendum Note (Signed)
Addended by: Bryna Colander on: 12/05/2017 11:22 AM   Modules accepted: Orders

## 2017-12-05 NOTE — Telephone Encounter (Signed)
Spoke with patient regarding crestor 20 mg once daily and she was agreeable to give that a try. She did report that she was unable to tolerate the zetia due to diarrhea. Added that intolerance to her allergies and reviewed increase for her crestor. She verbalized understanding of our conversation, agreement with plan, and had no further questions at this time.

## 2017-12-07 ENCOUNTER — Ambulatory Visit (INDEPENDENT_AMBULATORY_CARE_PROVIDER_SITE_OTHER): Payer: Medicare Other

## 2017-12-07 DIAGNOSIS — I480 Paroxysmal atrial fibrillation: Secondary | ICD-10-CM

## 2017-12-07 DIAGNOSIS — Z5181 Encounter for therapeutic drug level monitoring: Secondary | ICD-10-CM | POA: Diagnosis not present

## 2017-12-07 DIAGNOSIS — I2699 Other pulmonary embolism without acute cor pulmonale: Secondary | ICD-10-CM | POA: Diagnosis not present

## 2017-12-07 LAB — POCT INR: INR: 1.2

## 2017-12-07 NOTE — Patient Instructions (Signed)
Description   Today take 2.5 tablet, tomorrow take 2 tablets, then resume dosage of 1.5 tablets every day except 2 tablets on Mondays, Wednesdays and Fridays.  Please be consistent with your greens intake.  Recheck in 1 week.

## 2017-12-08 ENCOUNTER — Ambulatory Visit (INDEPENDENT_AMBULATORY_CARE_PROVIDER_SITE_OTHER): Payer: Medicare Other

## 2017-12-08 ENCOUNTER — Encounter (INDEPENDENT_AMBULATORY_CARE_PROVIDER_SITE_OTHER): Payer: Self-pay | Admitting: Vascular Surgery

## 2017-12-08 ENCOUNTER — Ambulatory Visit (INDEPENDENT_AMBULATORY_CARE_PROVIDER_SITE_OTHER): Payer: Medicare Other | Admitting: Vascular Surgery

## 2017-12-08 VITALS — BP 159/76 | HR 67 | Resp 16 | Ht 68.0 in | Wt 174.0 lb

## 2017-12-08 DIAGNOSIS — E782 Mixed hyperlipidemia: Secondary | ICD-10-CM | POA: Diagnosis not present

## 2017-12-08 DIAGNOSIS — I7025 Atherosclerosis of native arteries of other extremities with ulceration: Secondary | ICD-10-CM

## 2017-12-08 DIAGNOSIS — E1159 Type 2 diabetes mellitus with other circulatory complications: Secondary | ICD-10-CM

## 2017-12-08 DIAGNOSIS — L97909 Non-pressure chronic ulcer of unspecified part of unspecified lower leg with unspecified severity: Secondary | ICD-10-CM

## 2017-12-08 DIAGNOSIS — I25708 Atherosclerosis of coronary artery bypass graft(s), unspecified, with other forms of angina pectoris: Secondary | ICD-10-CM

## 2017-12-08 DIAGNOSIS — I70299 Other atherosclerosis of native arteries of extremities, unspecified extremity: Secondary | ICD-10-CM

## 2017-12-08 DIAGNOSIS — I480 Paroxysmal atrial fibrillation: Secondary | ICD-10-CM | POA: Diagnosis not present

## 2017-12-08 DIAGNOSIS — I10 Essential (primary) hypertension: Secondary | ICD-10-CM
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
## 2017-12-14 ENCOUNTER — Ambulatory Visit (INDEPENDENT_AMBULATORY_CARE_PROVIDER_SITE_OTHER): Payer: Medicare Other

## 2017-12-14 DIAGNOSIS — I2699 Other pulmonary embolism without acute cor pulmonale: Secondary | ICD-10-CM | POA: Diagnosis not present

## 2017-12-14 DIAGNOSIS — I480 Paroxysmal atrial fibrillation: Secondary | ICD-10-CM | POA: Diagnosis not present

## 2017-12-14 DIAGNOSIS — Z5181 Encounter for therapeutic drug level monitoring: Secondary | ICD-10-CM

## 2017-12-14 LAB — POCT INR: INR: 2.6

## 2017-12-14 NOTE — Patient Instructions (Signed)
Please continue dosage of 1.5 tablets every day except 2 tablets on Mondays, Wednesdays and Fridays.  Please be consistent with your greens intake.  Recheck in 3 weeks.  

## 2017-12-20 ENCOUNTER — Other Ambulatory Visit: Payer: Self-pay | Admitting: Podiatry

## 2017-12-20 DIAGNOSIS — E1169 Type 2 diabetes mellitus with other specified complication: Secondary | ICD-10-CM | POA: Insufficient documentation

## 2017-12-20 DIAGNOSIS — M869 Osteomyelitis, unspecified: Secondary | ICD-10-CM

## 2017-12-20 DIAGNOSIS — E1165 Type 2 diabetes mellitus with hyperglycemia: Secondary | ICD-10-CM | POA: Insufficient documentation

## 2017-12-21 ENCOUNTER — Telehealth: Payer: Self-pay

## 2017-12-21 ENCOUNTER — Encounter
Admission: RE | Admit: 2017-12-21 | Discharge: 2017-12-21 | Disposition: A | Discharge status: Home / Self Care | Payer: Medicare Other | Source: Ambulatory Visit | Attending: Podiatry | Admitting: Podiatry

## 2017-12-21 ENCOUNTER — Inpatient Hospital Stay: Admission: RE | Admit: 2017-12-21 | Payer: Medicare Other | Source: Ambulatory Visit

## 2017-12-21 ENCOUNTER — Other Ambulatory Visit: Payer: Self-pay

## 2017-12-21 DIAGNOSIS — I509 Heart failure, unspecified: Secondary | ICD-10-CM | POA: Diagnosis not present

## 2017-12-21 DIAGNOSIS — E11621 Type 2 diabetes mellitus with foot ulcer: Secondary | ICD-10-CM | POA: Diagnosis not present

## 2017-12-21 DIAGNOSIS — M869 Osteomyelitis, unspecified: Secondary | ICD-10-CM | POA: Diagnosis not present

## 2017-12-21 DIAGNOSIS — Z7984 Long term (current) use of oral hypoglycemic drugs: Secondary | ICD-10-CM | POA: Diagnosis not present

## 2017-12-21 DIAGNOSIS — Z7982 Long term (current) use of aspirin: Secondary | ICD-10-CM | POA: Diagnosis not present

## 2017-12-21 DIAGNOSIS — L97519 Non-pressure chronic ulcer of other part of right foot with unspecified severity: Secondary | ICD-10-CM | POA: Diagnosis not present

## 2017-12-21 DIAGNOSIS — I4891 Unspecified atrial fibrillation: Secondary | ICD-10-CM | POA: Diagnosis not present

## 2017-12-21 DIAGNOSIS — Z79891 Long term (current) use of opiate analgesic: Secondary | ICD-10-CM | POA: Diagnosis not present

## 2017-12-21 DIAGNOSIS — E1151 Type 2 diabetes mellitus with diabetic peripheral angiopathy without gangrene: Secondary | ICD-10-CM | POA: Diagnosis not present

## 2017-12-21 DIAGNOSIS — I110 Hypertensive heart disease with heart failure: Secondary | ICD-10-CM | POA: Diagnosis not present

## 2017-12-21 DIAGNOSIS — I2510 Atherosclerotic heart disease of native coronary artery without angina pectoris: Secondary | ICD-10-CM | POA: Diagnosis not present

## 2017-12-21 DIAGNOSIS — Z951 Presence of aortocoronary bypass graft: Secondary | ICD-10-CM | POA: Diagnosis not present

## 2017-12-21 DIAGNOSIS — Z7901 Long term (current) use of anticoagulants: Secondary | ICD-10-CM | POA: Diagnosis not present

## 2017-12-21 DIAGNOSIS — Z955 Presence of coronary angioplasty implant and graft: Secondary | ICD-10-CM | POA: Diagnosis not present

## 2017-12-21 DIAGNOSIS — Z79899 Other long term (current) drug therapy: Secondary | ICD-10-CM | POA: Diagnosis not present

## 2017-12-21 DIAGNOSIS — E1169 Type 2 diabetes mellitus with other specified complication: Secondary | ICD-10-CM | POA: Diagnosis present

## 2017-12-21 LAB — POTASSIUM: Potassium: 3.5 mmol/L (ref 3.5–5.1)

## 2017-12-21 NOTE — Telephone Encounter (Signed)
Patient is having toe amputation on Friday was instructed to hold coumadin since 5/14 and wants to know if she should come to have check before procedure.  Please call.

## 2017-12-21 NOTE — Patient Instructions (Signed)
Your procedure is scheduled on: Friday 12/23/17 Report to [facility]. To find out your arrival time please call [phone] between 1PM - 3PM on Thursday 12/22/17.  Remember: Instructions that are not followed completely may result in serious medical risk, up to and including death, or upon the discretion of your surgeon and anesthesiologist your surgery may need to be rescheduled.     _X__ 1. Do not eat food after midnight the night before your procedure.                 No gum chewing or hard candies. You may drink clear liquids up to 2 hours                 before you are scheduled to arrive for your surgery- DO not drink clear                 liquids within 2 hours of the start of your surgery.                 Clear Liquids include:  water, apple juice without pulp, clear carbohydrate                 drink such as Clearfast or Gatorade, Black Coffee or Tea (Do not add                 anything to coffee or tea).  __X__2.  On the morning of surgery brush your teeth with toothpaste and water, you                 may rinse your mouth with mouthwash if you wish.  Do not swallow any              toothpaste of mouthwash.     _X__ 3.  No Alcohol for 24 hours before or after surgery.   _X__ 4.  Do Not Smoke or use e-cigarettes For 24 Hours Prior to Your Surgery.                 Do not use any chewable tobacco products for at least 6 hours prior to                 surgery.  ____  5.  Bring all medications with you on the day of surgery if instructed.   __X__  6.  Notify your doctor if there is any change in your medical condition      (cold, fever, infections).     Do not wear jewelry, make-up, hairpins, clips or nail polish. Do not wear lotions, powders, or perfumes.  Do not shave 48 hours prior to surgery. Men may shave face and neck. Do not bring valuables to the hospital.    [facility] is not responsible for any belongings or  valuables.  Contacts, dentures/partials or body piercings may not be worn into surgery. Bring a case for your contacts, glasses or hearing aids, a denture cup will be supplied. Leave your suitcase in the car. After surgery it may be brought to your room. For patients admitted to the hospital, discharge time is determined by your treatment team.   Patients discharged the day of surgery will not be allowed to drive home.   Please read over the following fact sheets that you were given:   MRSA Information  __X__ Take these medicines the morning of surgery with A SIP OF WATER:  1. Amlodipine  2. Atenolol  3. Rosuvastatin  4.  5.  6.  ____ Fleet Enema (as directed)   __X__ Use CHG Soap/SAGE wipes as directed  ____ Use inhalers on the day of surgery  ____ Stop metformin/Janumet/Farxiga 2 days prior to surgery    ____ Take 1/2 of usual insulin dose the night before surgery. No insulin the morning          of surgery.   __X__ Stop Blood Thinners Coumadin/Plavix/Xarelto/Pleta/Pradaxa/Eliquis/Effient/Aspirin  on   Or contact your Surgeon, Cardiologist or Medical Doctor regarding  ability to stop your blood thinners  __X__ Stop Anti-inflammatories 7 days before surgery such as Advil, Ibuprofen, Motrin,  BC or Goodies Powder, Naprosyn, Naproxen, Aleve, Aspirin    __X__ Stop all herbal supplements, fish oil or vitamin E until after surgery.    ____ Bring C-Pap to the hospital.

## 2017-12-21 NOTE — Telephone Encounter (Addendum)
Spoke w/ pt.  Advised her that if ok'd by surgeon to resume coumadin on 5/17, to take extra 1/2 dose on Friday & Saturday and recheck next Wednesday.  She verbalizes understanding and will call back w/ any further questions or concerns.

## 2017-12-23 ENCOUNTER — Encounter
Admission: RE | Disposition: A | Discharge status: Home / Self Care | Payer: Self-pay | Source: Ambulatory Visit | Attending: Podiatry

## 2017-12-23 ENCOUNTER — Ambulatory Visit
Admission: RE | Admit: 2017-12-23 | Discharge: 2017-12-23 | Disposition: A | Discharge status: Home / Self Care | Payer: Medicare Other | Source: Ambulatory Visit | Attending: Podiatry | Admitting: Podiatry

## 2017-12-23 ENCOUNTER — Encounter: Payer: Self-pay | Admitting: *Deleted

## 2017-12-23 ENCOUNTER — Ambulatory Visit: Payer: Medicare Other | Admitting: Anesthesiology

## 2017-12-23 DIAGNOSIS — I4891 Unspecified atrial fibrillation: Secondary | ICD-10-CM | POA: Insufficient documentation

## 2017-12-23 DIAGNOSIS — Z955 Presence of coronary angioplasty implant and graft: Secondary | ICD-10-CM | POA: Insufficient documentation

## 2017-12-23 DIAGNOSIS — Z7984 Long term (current) use of oral hypoglycemic drugs: Secondary | ICD-10-CM | POA: Insufficient documentation

## 2017-12-23 DIAGNOSIS — Z7901 Long term (current) use of anticoagulants: Secondary | ICD-10-CM | POA: Insufficient documentation

## 2017-12-23 DIAGNOSIS — I110 Hypertensive heart disease with heart failure: Secondary | ICD-10-CM | POA: Insufficient documentation

## 2017-12-23 DIAGNOSIS — Z951 Presence of aortocoronary bypass graft: Secondary | ICD-10-CM | POA: Insufficient documentation

## 2017-12-23 DIAGNOSIS — E1151 Type 2 diabetes mellitus with diabetic peripheral angiopathy without gangrene: Secondary | ICD-10-CM | POA: Insufficient documentation

## 2017-12-23 DIAGNOSIS — E1169 Type 2 diabetes mellitus with other specified complication: Secondary | ICD-10-CM | POA: Diagnosis not present

## 2017-12-23 DIAGNOSIS — E11621 Type 2 diabetes mellitus with foot ulcer: Secondary | ICD-10-CM | POA: Insufficient documentation

## 2017-12-23 DIAGNOSIS — Z79899 Other long term (current) drug therapy: Secondary | ICD-10-CM | POA: Insufficient documentation

## 2017-12-23 DIAGNOSIS — Z79891 Long term (current) use of opiate analgesic: Secondary | ICD-10-CM | POA: Insufficient documentation

## 2017-12-23 DIAGNOSIS — I2510 Atherosclerotic heart disease of native coronary artery without angina pectoris: Secondary | ICD-10-CM | POA: Insufficient documentation

## 2017-12-23 DIAGNOSIS — Z7982 Long term (current) use of aspirin: Secondary | ICD-10-CM | POA: Insufficient documentation

## 2017-12-23 DIAGNOSIS — I509 Heart failure, unspecified: Secondary | ICD-10-CM | POA: Insufficient documentation

## 2017-12-23 DIAGNOSIS — M869 Osteomyelitis, unspecified: Secondary | ICD-10-CM | POA: Insufficient documentation

## 2017-12-23 DIAGNOSIS — L97519 Non-pressure chronic ulcer of other part of right foot with unspecified severity: Secondary | ICD-10-CM | POA: Insufficient documentation

## 2017-12-23 HISTORY — PX: IRRIGATION AND DEBRIDEMENT FOOT: SHX6602

## 2017-12-23 LAB — GLUCOSE, CAPILLARY
Glucose-Capillary: 225 mg/dL — ABNORMAL HIGH (ref 65–99)
Glucose-Capillary: 175 mg/dL — ABNORMAL HIGH (ref 65–99)

## 2017-12-23 LAB — PROTIME-INR
INR: 1.19
Prothrombin Time: 15 seconds (ref 11.4–15.2)

## 2017-12-23 SURGERY — IRRIGATION AND DEBRIDEMENT FOOT
Anesthesia: General | Laterality: Right | Wound class: Dirty or Infected

## 2017-12-23 MED ORDER — SODIUM CHLORIDE 0.9 % IV SOLN
INTRAVENOUS | Status: DC
  Administered 2017-12-23: 75 mL/h via INTRAVENOUS

## 2017-12-23 MED ORDER — PROPOFOL 10 MG/ML IV BOLUS
INTRAVENOUS | Status: AC
  Filled 2017-12-23: qty 20

## 2017-12-23 MED ORDER — ACETAMINOPHEN 10 MG/ML IV SOLN
INTRAVENOUS | Status: AC
  Filled 2017-12-23: qty 100

## 2017-12-23 MED ORDER — ONDANSETRON HCL 4 MG/2ML IJ SOLN
INTRAMUSCULAR | Status: DC | PRN
  Administered 2017-12-23: 4 mg via INTRAVENOUS

## 2017-12-23 MED ORDER — FENTANYL CITRATE (PF) 100 MCG/2ML IJ SOLN
INTRAMUSCULAR | Status: DC | PRN
  Administered 2017-12-23: 50 ug via INTRAVENOUS

## 2017-12-23 MED ORDER — ONDANSETRON HCL 4 MG/2ML IJ SOLN
4.0000 mg | Freq: Once | INTRAMUSCULAR | Status: AC | PRN
  Administered 2017-12-23: 4 mg via INTRAVENOUS

## 2017-12-23 MED ORDER — HYDROCODONE-ACETAMINOPHEN 5-325 MG PO TABS: 1.0000 | ORAL_TABLET | ORAL | 0 refills | Status: AC | PRN

## 2017-12-23 MED ORDER — ONDANSETRON HCL 4 MG/2ML IJ SOLN
INTRAMUSCULAR | Status: AC
  Administered 2017-12-23: 4 mg via INTRAVENOUS
  Filled 2017-12-23: qty 2

## 2017-12-23 MED ORDER — SODIUM CHLORIDE 0.9 % IV SOLN
3.0000 g | Freq: Once | INTRAVENOUS | Status: AC
  Administered 2017-12-23: 3 g via INTRAVENOUS
  Filled 2017-12-23: qty 3

## 2017-12-23 MED ORDER — VANCOMYCIN HCL 1000 MG IV SOLR
INTRAVENOUS | Status: DC | PRN
  Administered 2017-12-23: 1000 mg

## 2017-12-23 MED ORDER — FAMOTIDINE 20 MG PO TABS
ORAL_TABLET | ORAL | Status: AC
  Administered 2017-12-23: 20 mg via ORAL
  Filled 2017-12-23: qty 1

## 2017-12-23 MED ORDER — HYDROMORPHONE HCL 1 MG/ML IJ SOLN
INTRAMUSCULAR | Status: AC
  Filled 2017-12-23: qty 1

## 2017-12-23 MED ORDER — MIDAZOLAM HCL 2 MG/2ML IJ SOLN
INTRAMUSCULAR | Status: AC
  Filled 2017-12-23: qty 2

## 2017-12-23 MED ORDER — ROCURONIUM BROMIDE 100 MG/10ML IV SOLN
INTRAVENOUS | Status: DC | PRN
  Administered 2017-12-23: 5 mg via INTRAVENOUS

## 2017-12-23 MED ORDER — HYDROMORPHONE HCL 1 MG/ML IJ SOLN
INTRAMUSCULAR | Status: DC | PRN
  Administered 2017-12-23: .4 mg via INTRAVENOUS

## 2017-12-23 MED ORDER — POVIDONE-IODINE 7.5 % EX SOLN: Freq: Once | CUTANEOUS | Status: DC

## 2017-12-23 MED ORDER — PROPOFOL 10 MG/ML IV BOLUS
INTRAVENOUS | Status: DC | PRN
  Administered 2017-12-23: 160 mg via INTRAVENOUS

## 2017-12-23 MED ORDER — VANCOMYCIN HCL 1000 MG IV SOLR
INTRAVENOUS | Status: AC
  Filled 2017-12-23: qty 1000

## 2017-12-23 MED ORDER — MIDAZOLAM HCL 2 MG/2ML IJ SOLN
INTRAMUSCULAR | Status: DC | PRN
  Administered 2017-12-23: 2 mg via INTRAVENOUS

## 2017-12-23 MED ORDER — DEXAMETHASONE SODIUM PHOSPHATE 10 MG/ML IJ SOLN
INTRAMUSCULAR | Status: DC | PRN
  Administered 2017-12-23: 5 mg via INTRAVENOUS

## 2017-12-23 MED ORDER — FENTANYL CITRATE (PF) 100 MCG/2ML IJ SOLN: 25.0000 ug | INTRAMUSCULAR | Status: DC | PRN

## 2017-12-23 MED ORDER — ONDANSETRON HCL 4 MG/2ML IJ SOLN
INTRAMUSCULAR | Status: AC
  Filled 2017-12-23: qty 2

## 2017-12-23 MED ORDER — NEOMYCIN-POLYMYXIN B GU 40-200000 IR SOLN
Status: AC
  Filled 2017-12-23: qty 2

## 2017-12-23 MED ORDER — ACETAMINOPHEN 10 MG/ML IV SOLN
INTRAVENOUS | Status: DC | PRN
  Administered 2017-12-23: 1000 mg via INTRAVENOUS

## 2017-12-23 MED ORDER — BUPIVACAINE HCL (PF) 0.5 % IJ SOLN
INTRAMUSCULAR | Status: DC | PRN
  Administered 2017-12-23: 10 mL

## 2017-12-23 MED ORDER — LIDOCAINE HCL (CARDIAC) PF 100 MG/5ML IV SOSY
PREFILLED_SYRINGE | INTRAVENOUS | Status: DC | PRN
  Administered 2017-12-23: 100 mg via INTRAVENOUS

## 2017-12-23 MED ORDER — FAMOTIDINE 20 MG PO TABS
20.0000 mg | ORAL_TABLET | Freq: Once | ORAL | Status: AC
  Administered 2017-12-23: 20 mg via ORAL

## 2017-12-23 MED ORDER — GLYCOPYRROLATE 0.2 MG/ML IJ SOLN
INTRAMUSCULAR | Status: DC | PRN
  Administered 2017-12-23 (×2): 0.2 mg via INTRAVENOUS

## 2017-12-23 MED ORDER — BUPIVACAINE HCL (PF) 0.5 % IJ SOLN
INTRAMUSCULAR | Status: AC
  Filled 2017-12-23: qty 30

## 2017-12-23 MED ORDER — EPHEDRINE SULFATE 50 MG/ML IJ SOLN
INTRAMUSCULAR | Status: DC | PRN
  Administered 2017-12-23: 15 mg via INTRAVENOUS
  Administered 2017-12-23: 10 mg via INTRAVENOUS
  Administered 2017-12-23: 15 mg via INTRAVENOUS

## 2017-12-23 MED ORDER — FENTANYL CITRATE (PF) 100 MCG/2ML IJ SOLN
INTRAMUSCULAR | Status: AC
  Filled 2017-12-23: qty 2

## 2017-12-23 MED ORDER — SUCCINYLCHOLINE CHLORIDE 20 MG/ML IJ SOLN
INTRAMUSCULAR | Status: DC | PRN
  Administered 2017-12-23: 100 mg via INTRAVENOUS

## 2017-12-23 MED ORDER — SODIUM CHLORIDE 0.9 % IJ SOLN
INTRAMUSCULAR | Status: AC
  Filled 2017-12-23: qty 20

## 2017-12-23 MED ORDER — SODIUM CHLORIDE 0.9% FLUSH: 10.0000 mL | Freq: Once | INTRAVENOUS | Status: DC

## 2017-12-23 MED ORDER — LACTATED RINGERS IV SOLN
INTRAVENOUS | Status: DC | PRN
Start: 2017-12-23 — End: 2017-12-23
  Administered 2017-12-23: 08:00:00 via INTRAVENOUS

## 2017-12-23 SURGICAL SUPPLY — 48 items
BANDAGE ELASTIC 4 LF NS (GAUZE/BANDAGES/DRESSINGS) ×2 IMPLANT
BLADE OSC/SAGITTAL MD 5.5X18 (BLADE) ×1 IMPLANT
BLADE SURG 15 STRL LF DISP TIS (BLADE) ×1 IMPLANT
BLADE SURG 15 STRL SS (BLADE) ×2
BNDG CMPR 75X21 PLY HI ABS (MISCELLANEOUS) ×1
BNDG CMPR MED 5X4 ELC HKLP NS (GAUZE/BANDAGES/DRESSINGS) ×1
BNDG ESMARK 4X12 TAN STRL LF (GAUZE/BANDAGES/DRESSINGS) ×2 IMPLANT
BNDG GAUZE 4.5X4.1 6PLY STRL (MISCELLANEOUS) ×2 IMPLANT
CANISTER SUCT 1200ML W/VALVE (MISCELLANEOUS) ×2 IMPLANT
CUFF TOURN 18 STER (MISCELLANEOUS) ×2 IMPLANT
CUFF TOURN DUAL PL 12 NO SLV (MISCELLANEOUS) ×2 IMPLANT
DRSG MEPITEL 4X7.2 (GAUZE/BANDAGES/DRESSINGS) ×1 IMPLANT
DURAPREP 26ML APPLICATOR (WOUND CARE) ×2 IMPLANT
ELECT REM PT RETURN 9FT ADLT (ELECTROSURGICAL) ×2
ELECTRODE REM PT RTRN 9FT ADLT (ELECTROSURGICAL) ×1 IMPLANT
GAUZE PETRO XEROFOAM 1X8 (MISCELLANEOUS) ×2 IMPLANT
GAUZE SPONGE 4X4 12PLY STRL (GAUZE/BANDAGES/DRESSINGS) ×2 IMPLANT
GAUZE STRETCH 2X75IN STRL (MISCELLANEOUS) ×2 IMPLANT
GLOVE BIO SURGEON STRL SZ7.5 (GLOVE) ×2 IMPLANT
GLOVE INDICATOR 8.0 STRL GRN (GLOVE) ×2 IMPLANT
GOWN STRL REUS W/ TWL LRG LVL3 (GOWN DISPOSABLE) ×2 IMPLANT
GOWN STRL REUS W/TWL LRG LVL3 (GOWN DISPOSABLE) ×4
HANDPIECE VERSAJET DEBRIDEMENT (MISCELLANEOUS) ×2 IMPLANT
KIT STIMULAN RAPID CURE 5CC (Orthopedic Implant) ×1 IMPLANT
LABEL OR SOLS (LABEL) ×2 IMPLANT
NDL FILTER BLUNT 18X1 1/2 (NEEDLE) ×1 IMPLANT
NDL HYPO 25X1 1.5 SAFETY (NEEDLE) ×3 IMPLANT
NEEDLE FILTER BLUNT 18X 1/2SAF (NEEDLE) ×1
NEEDLE FILTER BLUNT 18X1 1/2 (NEEDLE) ×1 IMPLANT
NEEDLE HYPO 25X1 1.5 SAFETY (NEEDLE) ×6 IMPLANT
NS IRRIG 500ML POUR BTL (IV SOLUTION) ×2 IMPLANT
PACK EXTREMITY ARMC (MISCELLANEOUS) ×2 IMPLANT
PAD ABD DERMACEA PRESS 5X9 (GAUZE/BANDAGES/DRESSINGS) ×4 IMPLANT
PENCIL ELECTRO HAND CTR (MISCELLANEOUS) ×2 IMPLANT
SOL PREP PVP 2OZ (MISCELLANEOUS) ×2
SOLUTION PREP PVP 2OZ (MISCELLANEOUS) ×1 IMPLANT
STAPLER SKIN PROX 35W (STAPLE) ×1 IMPLANT
STOCKINETTE 48X4 2 PLY STRL (GAUZE/BANDAGES/DRESSINGS) ×1 IMPLANT
STOCKINETTE STRL 4IN 9604848 (GAUZE/BANDAGES/DRESSINGS) ×2 IMPLANT
STOCKINETTE STRL 6IN 960660 (GAUZE/BANDAGES/DRESSINGS) ×2 IMPLANT
SUT ETHILON 4-0 (SUTURE) ×2
SUT ETHILON 4-0 FS2 18XMFL BLK (SUTURE) ×1
SUT VIC AB 3-0 SH 27 (SUTURE) ×2
SUT VIC AB 3-0 SH 27X BRD (SUTURE) ×1 IMPLANT
SUT VIC AB 4-0 FS2 27 (SUTURE) ×2 IMPLANT
SUTURE ETHLN 4-0 FS2 18XMF BLK (SUTURE) ×1 IMPLANT
SYR 10ML LL (SYRINGE) ×4 IMPLANT
SYR 3ML LL SCALE MARK (SYRINGE) ×2 IMPLANT

## 2017-12-23 NOTE — Interval H&P Note (Signed)
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 After consideration of risks, benefits and other options for treatment, the patient has consented to  Procedure(s): IRRIGATION AND DEBRIDEMENT FOOT/28005 (Right) as a surgical intervention .  The patient's history has been reviewed, patient examined, no change in status, stable for surgery.  I have reviewed the patient's chart and labs.  Questions were answered to the patient's satisfaction.     Ricci Barker

## 2017-12-23 NOTE — Anesthesia Preprocedure Evaluation (Signed)
Anesthesia Evaluation  Patient identified by MRN, date of birth, ID band Patient awake    Reviewed: Allergy & Precautions, NPO status , Patient's Chart, lab work & pertinent test results, reviewed documented beta blocker date and time   History of Anesthesia Complications (+) history of anesthetic complications ("hard to numb")  Airway Mallampati: II       Dental   Pulmonary neg sleep apnea, neg COPD,           Cardiovascular hypertension, Pt. on medications and Pt. on home beta blockers + CAD, + Cardiac Stents, + CABG, + Peripheral Vascular Disease and +CHF  + dysrhythmias Atrial Fibrillation      Neuro/Psych neg Seizures Anxiety Depression    GI/Hepatic Neg liver ROS, neg GERD  ,  Endo/Other  diabetes, Type 2, Oral Hypoglycemic Agents  Renal/GU negative Renal ROS     Musculoskeletal   Abdominal   Peds  Hematology  (+) anemia ,   Anesthesia Other Findings   Reproductive/Obstetrics                             Anesthesia Physical Anesthesia Plan  ASA: III  Anesthesia Plan: General   Post-op Pain Management:    Induction: Intravenous  PONV Risk Score and Plan: 3 and Dexamethasone, Ondansetron and Midazolam  Airway Management Planned: Oral ETT  Additional Equipment:   Intra-op Plan:   Post-operative Plan:   Informed Consent: I have reviewed the patients History and Physical, chart, labs and discussed the procedure including the risks, benefits and alternatives for the proposed anesthesia with the patient or authorized representative who has indicated his/her understanding and acceptance.     Plan Discussed with:   Anesthesia Plan Comments:         Anesthesia Quick Evaluation

## 2017-12-23 NOTE — Progress Notes (Signed)
Chaplain was asked to pray for the patient prior to surgery. Patient was apprehensive about the surgical outcome and recovery, especially the idea of a future amputation. Patient relies on her faith to give her hope and to lessen anxiety. Patient is also greatly concerned about her daughters. Chaplain provided active listening, a ministry of presence, and prayer.

## 2017-12-23 NOTE — Anesthesia Postprocedure Evaluation (Signed)
Anesthesia Post Note  Patient: [name]  Procedure(s) Performed: IRRIGATION AND DEBRIDEMENT FOOT/28005 (Right )  Patient location during evaluation: PACU Anesthesia Type: General Level of consciousness: awake and alert Pain management: pain level controlled Vital Signs Assessment: post-procedure vital signs reviewed and stable Respiratory status: spontaneous breathing and respiratory function stable Cardiovascular status: stable Anesthetic complications: no     Last Vitals:  Vitals:   12/23/17 0937 12/23/17 0939  BP: (!) 170/65   Pulse: (!) 54   Resp: 15   Temp:  (!) 36.1 C  SpO2: 97%     Last Pain:  Vitals:   12/23/17 0937  TempSrc:   PainSc: 0-No pain                 Ezreal Turay K

## 2017-12-23 NOTE — H&P (Signed)
  Medical history and physical in the chart was reviewed.  Patient stable for surgery. 

## 2017-12-23 NOTE — Transfer of Care (Signed)
Immediate Anesthesia Transfer of Care Note  Patient: [name]  Procedure(s) Performed: IRRIGATION AND DEBRIDEMENT FOOT/28005 (Right )  Patient Location: PACU  Anesthesia Type:General  Level of Consciousness: sedated  Airway & Oxygen Therapy: Patient Spontanous Breathing and Patient connected to face mask oxygen  Post-op Assessment: Report given to RN  Post vital signs: Reviewed and stable  Last Vitals:  Vitals Value Taken Time  BP 160/72 12/23/2017  9:07 AM  Temp    Pulse 53 12/23/2017  9:21 AM  Resp 11 12/23/2017  9:21 AM  SpO2 98 % 12/23/2017  9:21 AM  Vitals shown include unvalidated device data.  Last Pain:  Vitals:   12/23/17 0907  TempSrc:   PainSc: Asleep      Patients Stated Pain Goal: 1 (12/23/17 0631)  Complications: No apparent anesthesia complications

## 2017-12-23 NOTE — Anesthesia Post-op Follow-up Note (Signed)
Anesthesia QCDR form completed.        

## 2017-12-23 NOTE — OR Nursing (Signed)
Patient complains of extreme nausea this morning. No vomitting.

## 2017-12-23 NOTE — Anesthesia Procedure Notes (Signed)
Procedure Name: Intubation Date/Time: 12/23/2017 7:41 AM Performed by: Justus Memory, CRNA Pre-anesthesia Checklist: Patient identified, Patient being monitored, Timeout performed, Emergency Drugs available and Suction available Patient Re-evaluated:Patient Re-evaluated prior to induction Oxygen Delivery Method: Circle system utilized Preoxygenation: Pre-oxygenation with 100% oxygen Induction Type: IV induction Ventilation: Mask ventilation without difficulty Laryngoscope Size: Mac and 3 Grade View: Grade I Tube type: Oral Tube size: 7.0 mm Number of attempts: 1 Airway Equipment and Method: Stylet Placement Confirmation: ETT inserted through vocal cords under direct vision,  positive ETCO2 and breath sounds checked- equal and bilateral Secured at: 21 cm Tube secured with: Tape Dental Injury: Teeth and Oropharynx as per pre-operative assessment

## 2017-12-23 NOTE — OR Nursing (Signed)
Discussed discharge instructions with pt and husband. Both voice understanding. 

## 2017-12-23 NOTE — Discharge Instructions (Addendum)
1.  Elevate right lower extremity.  2.  Keep the bandage on the right foot clean, dry, and do not remove.  3.  Sponge bathe only in the right lower extremity.  4.  No weight on right foot using crutches or walker.  5.  Take 1 pain pill, Norco, every 4 hours only if needed for pain.    AMBULATORY SURGERY  DISCHARGE INSTRUCTIONS   1) The drugs that you were given will stay in your system until tomorrow so for the next 24 hours you should not:  A) Drive an automobile B) Make any legal decisions C) Drink any alcoholic beverage   2) You may resume regular meals tomorrow.  Today it is better to start with liquids and gradually work up to solid foods.  You may eat anything you prefer, but it is better to start with liquids, then soup and crackers, and gradually work up to solid foods.   3) Please notify your doctor immediately if you have any unusual bleeding, trouble breathing, redness and pain at the surgery site, drainage, fever, or pain not relieved by medication.    4) Additional Instructions:        Please contact your physician with any problems or Same Day Surgery at [phone], Monday through Friday 6 am to 4 pm, or [place] at [facility] number at [phone].

## 2017-12-23 NOTE — OR Nursing (Signed)
PICC line flushed with 20ml NS. Site clear.

## 2017-12-23 NOTE — Interval H&P Note (Signed)
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 After consideration of risks, benefits and other options for treatment, the patient has consented to  Procedure(s): IRRIGATION AND DEBRIDEMENT FOOT/28005 (Right) as a surgical intervention .  The patient's history has been reviewed, patient examined, no change in status, stable for surgery.  I have reviewed the patient's chart and labs.  Questions were answered to the patient's satisfaction.     Ricci Barker

## 2017-12-23 NOTE — Op Note (Signed)
Date of operation: 12/23/2017.  Surgeon: Ricci Barker D.P.M.  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  Postoperative diagnosis: Same.  Procedures: 1.  Debridement infected bone right fifth metatarsal phalangeal joint with excisional debridement plantar ulceration right forefoot.  Anesthesia: General endotracheal with local.  Hemostasis: None.  Estimated blood loss: 15 to 20 cc.  Pathology: Right fifth metatarsal and joint.  Cultures: Bone cultures right fifth metatarsal.  Implants: Stimulan rapid cure antibiotic beads impregnated with vancomycin.  Complications: None apparent.  Operative indications: This is a 67 year old female with a long history of peripheral vascular disease and ulceration.  Has had chronic ulceration on the right foot which recently showed suspected osteomyelitis on MRI which was confirmed with obvious changes on most recent x-rays.  Decision was made for debridement of the bone and ulceration on the right foot.  Operative procedure: Patient was taken to the operating room and placed on the table in the supine position.  Following satisfactory general anesthesia the right foot was anesthetized with 10 cc of 0.5% bupivacaine plain around the fifth metatarsal area.  The foot was then prepped and draped in the usual sterile fashion.  Attention was directed to the dorsal lateral aspect of the right foot where an approximate 6 cm linear incision was made over the fifth metatarsal and metatarsal phalangeal joint dorsally.  The incision was deepened sharply down to the level of the bone which was freed from the surrounding normal anatomy.  Using a sagittal saw the distal half of the fifth metatarsal was incised as well as the base of the proximal phalanx of the fifth toe and the 2 portions of bone and the joint were then removed in toto.  A culture was taken from the bone in the fifth metatarsal head.    At this point  attention was directed plantarly to the full-thickness ulceration and eschar which was sharply excised using a 15 blade down through all levels of tissue to the level of the previous bone.  Some necrotic tissue was noted and this was all excised.  The remaining wound edges were then debrided with a versa jet debrider on a setting of 5 and then thoroughly irrigated with the versa jet on a setting of 2.  Good healthy bleeding tissues were noted throughout the wound.  Bleeders were controlled with cautery.  The dorsal incision was then closed using 3-0 nylon simple interrupted sutures.  The plantar wound was not able to be sutured so Mepitel and staples were placed around the inferior aspect of the remaining wound and the Stimulan rapid cure antibiotic beads then placed into the wound and the remainder of the Mepitel stapled around the open wound.  Xeroform applied to the incision followed by 4 x 4's ABDs Kerlix and an Ace wrap.  The patient tolerated the procedure and anesthesia well and was transported to the PACU with vital signs stable and in good condition.

## 2017-12-26 LAB — AEROBIC CULTURE W GRAM STAIN (SUPERFICIAL SPECIMEN)
Culture: NO GROWTH
Gram Stain: NONE SEEN

## 2017-12-27 LAB — SURGICAL PATHOLOGY

## 2017-12-28 ENCOUNTER — Ambulatory Visit (INDEPENDENT_AMBULATORY_CARE_PROVIDER_SITE_OTHER): Payer: Medicare Other

## 2017-12-28 DIAGNOSIS — I2699 Other pulmonary embolism without acute cor pulmonale: Secondary | ICD-10-CM | POA: Diagnosis not present

## 2017-12-28 DIAGNOSIS — Z5181 Encounter for therapeutic drug level monitoring: Secondary | ICD-10-CM | POA: Diagnosis not present

## 2017-12-28 DIAGNOSIS — I480 Paroxysmal atrial fibrillation: Secondary | ICD-10-CM

## 2017-12-28 LAB — ANAEROBIC CULTURE

## 2017-12-28 LAB — POCT INR: INR: 2.8 (ref 2.0–3.0)

## 2017-12-28 NOTE — Patient Instructions (Signed)
Please continue dosage of 1.5 tablets every day except 2 tablets on Mondays, Wednesdays and Fridays.  Please be consistent with your greens intake.  Recheck in 4 weeks.

## 2017-12-29 NOTE — Addendum Note (Signed)
Encounter addended by: Rosalita Chessman, RN on: 12/29/2017 1:17 PM  Actions taken: Charge Capture section accepted

## 2017-12-29 NOTE — Addendum Note (Signed)
Encounter addended by: Rosalita Chessman, RN on: 12/29/2017 1:18 PM  Actions taken: Charge Capture section accepted

## 2018-01-02 ENCOUNTER — Other Ambulatory Visit
Admission: RE | Admit: 2018-01-02 | Discharge: 2018-01-02 | Disposition: A | Discharge status: Home / Self Care | Payer: Medicare Other | Source: Other Acute Inpatient Hospital | Attending: Internal Medicine | Admitting: Internal Medicine

## 2018-01-02 DIAGNOSIS — E1169 Type 2 diabetes mellitus with other specified complication: Secondary | ICD-10-CM | POA: Insufficient documentation

## 2018-01-02 LAB — CBC WITH DIFFERENTIAL/PLATELET
Basophils Absolute: 0.1 10*3/uL (ref 0–0.1)
Basophils Relative: 1 %
EOS ABS: 0.2 10*3/uL (ref 0–0.7)
Eosinophils Relative: 3 %
HCT: 34.8 % — ABNORMAL LOW (ref 35.0–47.0)
Hemoglobin: 12 g/dL (ref 12.0–16.0)
LYMPHS ABS: 1.4 10*3/uL (ref 1.0–3.6)
Lymphocytes Relative: 20 %
MCH: 29.3 pg (ref 26.0–34.0)
MCHC: 34.4 g/dL (ref 32.0–36.0)
MCV: 85.2 fL (ref 80.0–100.0)
MONOS PCT: 8 %
Monocytes Absolute: 0.6 10*3/uL (ref 0.2–0.9)
Neutro Abs: 4.6 10*3/uL (ref 1.4–6.5)
Neutrophils Relative %: 68 %
PLATELETS: 240 10*3/uL (ref 150–440)
RBC: 4.09 MIL/uL (ref 3.80–5.20)
RDW: 15.5 % — ABNORMAL HIGH (ref 11.5–14.5)
WBC: 6.8 10*3/uL (ref 3.6–11.0)

## 2018-01-02 LAB — CREATININE, SERUM
Creatinine, Ser: 0.73 mg/dL (ref 0.44–1.00)
GFR calc Af Amer: >60 mL/min (ref >60)
GFR calc non Af Amer: >60 mL/min (ref >60)

## 2018-01-02 LAB — SEDIMENTATION RATE: SED RATE: 45 mm/h — AB (ref 0–30)

## 2018-01-02 LAB — C-REACTIVE PROTEIN

## 2018-01-12 ENCOUNTER — Other Ambulatory Visit: Payer: Self-pay | Admitting: Cardiovascular Disease

## 2018-01-12 NOTE — Telephone Encounter (Signed)
Refill Request.  

## 2018-01-13 NOTE — Telephone Encounter (Signed)
Please review for refill, Thank you. 

## 2018-01-25 ENCOUNTER — Ambulatory Visit (INDEPENDENT_AMBULATORY_CARE_PROVIDER_SITE_OTHER): Payer: Medicare Other

## 2018-01-25 DIAGNOSIS — I2699 Other pulmonary embolism without acute cor pulmonale: Secondary | ICD-10-CM

## 2018-01-25 DIAGNOSIS — Z5181 Encounter for therapeutic drug level monitoring: Secondary | ICD-10-CM | POA: Diagnosis not present

## 2018-01-25 DIAGNOSIS — I480 Paroxysmal atrial fibrillation: Secondary | ICD-10-CM

## 2018-01-25 LAB — POCT INR: INR: 1.9 — AB (ref 2.0–3.0)

## 2018-01-25 NOTE — Patient Instructions (Signed)
Please take extra 1/2 tablet tomorrow (10 mg 3 days in a row this week), then continue dosage of 1.5 tablets every day except 2 tablets on Mondays, Wednesdays and Fridays.  Please be consistent with your greens intake.  Recheck in 4 weeks.

## 2018-02-22 ENCOUNTER — Ambulatory Visit (INDEPENDENT_AMBULATORY_CARE_PROVIDER_SITE_OTHER): Payer: Medicare Other

## 2018-02-22 DIAGNOSIS — I480 Paroxysmal atrial fibrillation: Secondary | ICD-10-CM | POA: Diagnosis not present

## 2018-02-22 DIAGNOSIS — Z5181 Encounter for therapeutic drug level monitoring: Secondary | ICD-10-CM | POA: Diagnosis not present

## 2018-02-22 DIAGNOSIS — I2699 Other pulmonary embolism without acute cor pulmonale: Secondary | ICD-10-CM

## 2018-02-22 LAB — POCT INR: INR: 2.5 (ref 2.0–3.0)

## 2018-02-22 NOTE — Patient Instructions (Signed)
Please continue dosage of 1.5 tablets every day except 2 tablets on Mondays, Wednesdays and Fridays.  Please be consistent with your greens intake.  Recheck in 5 weeks.

## 2018-03-16 ENCOUNTER — Encounter (INDEPENDENT_AMBULATORY_CARE_PROVIDER_SITE_OTHER): Payer: Medicare Other

## 2018-03-16 ENCOUNTER — Ambulatory Visit (INDEPENDENT_AMBULATORY_CARE_PROVIDER_SITE_OTHER): Payer: Medicare Other | Admitting: Vascular Surgery

## 2018-03-29 ENCOUNTER — Ambulatory Visit (INDEPENDENT_AMBULATORY_CARE_PROVIDER_SITE_OTHER): Payer: Medicare Other

## 2018-03-29 DIAGNOSIS — Z5181 Encounter for therapeutic drug level monitoring: Secondary | ICD-10-CM

## 2018-03-29 DIAGNOSIS — I480 Paroxysmal atrial fibrillation: Secondary | ICD-10-CM

## 2018-03-29 DIAGNOSIS — I2699 Other pulmonary embolism without acute cor pulmonale: Secondary | ICD-10-CM

## 2018-03-29 LAB — POCT INR: INR: 2.1 (ref 2.0–3.0)

## 2018-03-29 NOTE — Patient Instructions (Signed)
Please continue dosage of 1.5 tablets every day except 2 tablets on Mondays, Wednesdays and Fridays.  Please be consistent with your greens intake.  Recheck in 6 weeks.

## 2018-06-01 ENCOUNTER — Encounter

## 2018-06-01 ENCOUNTER — Encounter (INDEPENDENT_AMBULATORY_CARE_PROVIDER_SITE_OTHER): Payer: Medicare Other

## 2018-06-01 ENCOUNTER — Ambulatory Visit (INDEPENDENT_AMBULATORY_CARE_PROVIDER_SITE_OTHER): Payer: Medicare Other | Admitting: Vascular Surgery

## 2018-06-05 ENCOUNTER — Ambulatory Visit (INDEPENDENT_AMBULATORY_CARE_PROVIDER_SITE_OTHER): Payer: Medicare Other

## 2018-06-05 DIAGNOSIS — I480 Paroxysmal atrial fibrillation: Secondary | ICD-10-CM

## 2018-06-05 DIAGNOSIS — I209 Angina pectoris, unspecified: Secondary | ICD-10-CM | POA: Diagnosis not present

## 2018-06-05 DIAGNOSIS — I2699 Other pulmonary embolism without acute cor pulmonale: Secondary | ICD-10-CM

## 2018-06-05 DIAGNOSIS — Z5181 Encounter for therapeutic drug level monitoring: Secondary | ICD-10-CM | POA: Diagnosis not present

## 2018-06-05 LAB — POCT INR: INR: 1.4 — AB (ref 2.0–3.0)

## 2018-06-05 NOTE — Patient Instructions (Signed)
Please take extra 1/2 tablet tonight & tomorrow and then continue dosage of 1.5 tablets every day except 2 tablets on Mondays, Wednesdays and Fridays.  Please be consistent with your greens intake.  Recheck in 2 weeks.

## 2018-06-15 ENCOUNTER — Other Ambulatory Visit: Payer: Self-pay | Admitting: Cardiovascular Disease

## 2018-06-15 NOTE — Telephone Encounter (Signed)
Please review for refills. Thanks!  

## 2018-06-21 ENCOUNTER — Ambulatory Visit (INDEPENDENT_AMBULATORY_CARE_PROVIDER_SITE_OTHER): Payer: Medicare Other

## 2018-06-21 DIAGNOSIS — I5033 Acute on chronic diastolic (congestive) heart failure: Secondary | ICD-10-CM

## 2018-06-21 DIAGNOSIS — I480 Paroxysmal atrial fibrillation: Secondary | ICD-10-CM

## 2018-06-21 DIAGNOSIS — I2699 Other pulmonary embolism without acute cor pulmonale: Secondary | ICD-10-CM | POA: Diagnosis not present

## 2018-06-21 DIAGNOSIS — Z5181 Encounter for therapeutic drug level monitoring: Secondary | ICD-10-CM | POA: Diagnosis not present

## 2018-06-21 LAB — POCT INR: INR: 2.3 (ref 2.0–3.0)

## 2018-06-21 NOTE — Patient Instructions (Addendum)
Please continue dosage of 1.5 tablets every day except 2 tablets on Mondays, Wednesdays and Fridays.  Please be consistent with your greens intake.  Recheck in 3 weeks.  

## 2018-06-29 ENCOUNTER — Ambulatory Visit (INDEPENDENT_AMBULATORY_CARE_PROVIDER_SITE_OTHER): Payer: Medicare Other | Admitting: Vascular Surgery

## 2018-06-29 ENCOUNTER — Encounter (INDEPENDENT_AMBULATORY_CARE_PROVIDER_SITE_OTHER): Payer: Medicare Other

## 2018-07-20 ENCOUNTER — Ambulatory Visit (INDEPENDENT_AMBULATORY_CARE_PROVIDER_SITE_OTHER): Payer: Medicare Other | Admitting: Vascular Surgery

## 2018-07-20 ENCOUNTER — Encounter (INDEPENDENT_AMBULATORY_CARE_PROVIDER_SITE_OTHER): Payer: Medicare Other

## 2018-08-04 ENCOUNTER — Telehealth: Payer: Self-pay | Admitting: Cardiovascular Disease

## 2018-08-04 NOTE — Telephone Encounter (Signed)
Call attempted. No VM available.  

## 2018-08-04 NOTE — Telephone Encounter (Signed)
Patient calling States that her arm has turned purple from the wrist to the elbow Yesterday it was real sore and there is a hard knot on it  Please call to discuss

## 2018-08-04 NOTE — Telephone Encounter (Signed)
Patient returning call.

## 2018-08-07 ENCOUNTER — Ambulatory Visit (INDEPENDENT_AMBULATORY_CARE_PROVIDER_SITE_OTHER): Payer: Medicare Other

## 2018-08-07 ENCOUNTER — Ambulatory Visit (INDEPENDENT_AMBULATORY_CARE_PROVIDER_SITE_OTHER): Payer: Medicare Other | Admitting: Vascular Surgery

## 2018-08-07 DIAGNOSIS — I480 Paroxysmal atrial fibrillation: Secondary | ICD-10-CM | POA: Diagnosis not present

## 2018-08-07 DIAGNOSIS — I2699 Other pulmonary embolism without acute cor pulmonale: Secondary | ICD-10-CM | POA: Diagnosis not present

## 2018-08-07 DIAGNOSIS — Z5181 Encounter for therapeutic drug level monitoring: Secondary | ICD-10-CM

## 2018-08-07 LAB — POCT INR: INR: 1.6 — AB (ref 2.0–3.0)

## 2018-08-07 NOTE — Telephone Encounter (Signed)
Pt in clinic today for coumadin check and I was able to see patient.  She reported that her arm is healing and she denied any known injury.  She has bruising from wrist to elbow in various stages of healing. Fingers are warm and normal skin tone.    Advised pt to call for any further questions or concerns or new bruising.

## 2018-08-07 NOTE — Patient Instructions (Signed)
Please take extra 1/2 tablet today & tomorrow, then continue dosage of 1.5 tablets every day except 2 tablets on Mondays, Wednesdays and Fridays.  Please be consistent with your greens intake.  Recheck in 2 weeks.

## 2018-08-14 ENCOUNTER — Other Ambulatory Visit: Payer: Self-pay | Admitting: Internal Medicine

## 2018-08-14 DIAGNOSIS — Z1231 Encounter for screening mammogram for malignant neoplasm of breast: Secondary | ICD-10-CM

## 2018-08-15 ENCOUNTER — Other Ambulatory Visit: Payer: Self-pay | Admitting: Internal Medicine

## 2018-08-15 DIAGNOSIS — R0781 Pleurodynia: Secondary | ICD-10-CM

## 2018-08-18 ENCOUNTER — Ambulatory Visit: Admission: RE | Admit: 2018-08-18 | Payer: Medicare Other | Source: Ambulatory Visit

## 2018-08-23 ENCOUNTER — Ambulatory Visit (INDEPENDENT_AMBULATORY_CARE_PROVIDER_SITE_OTHER): Payer: Medicare Other

## 2018-08-23 DIAGNOSIS — I480 Paroxysmal atrial fibrillation: Secondary | ICD-10-CM

## 2018-08-23 DIAGNOSIS — Z5181 Encounter for therapeutic drug level monitoring: Secondary | ICD-10-CM | POA: Diagnosis not present

## 2018-08-23 DIAGNOSIS — I2699 Other pulmonary embolism without acute cor pulmonale: Secondary | ICD-10-CM | POA: Diagnosis not present

## 2018-08-23 DIAGNOSIS — I5033 Acute on chronic diastolic (congestive) heart failure: Secondary | ICD-10-CM | POA: Diagnosis not present

## 2018-08-23 LAB — POCT INR: INR: 2.3 (ref 2.0–3.0)

## 2018-08-23 NOTE — Patient Instructions (Signed)
Please continue dosage of 1.5 tablets every day except 2 tablets on Mondays, Wednesdays and Fridays.  Please be consistent with your greens intake.  Recheck in 3 weeks.

## 2018-08-24 ENCOUNTER — Ambulatory Visit: Admission: RE | Admit: 2018-08-24 | Payer: Medicare Other | Source: Ambulatory Visit

## 2018-09-07 ENCOUNTER — Other Ambulatory Visit: Payer: Self-pay | Admitting: Cardiovascular Disease

## 2018-09-07 NOTE — Telephone Encounter (Signed)
Please review for refill.  

## 2018-09-12 ENCOUNTER — Ambulatory Visit: Payer: Medicare Other

## 2018-09-20 ENCOUNTER — Other Ambulatory Visit (INDEPENDENT_AMBULATORY_CARE_PROVIDER_SITE_OTHER): Payer: Medicare Other

## 2018-09-20 ENCOUNTER — Ambulatory Visit (INDEPENDENT_AMBULATORY_CARE_PROVIDER_SITE_OTHER): Payer: Medicare Other

## 2018-09-20 DIAGNOSIS — I480 Paroxysmal atrial fibrillation: Secondary | ICD-10-CM

## 2018-09-20 DIAGNOSIS — I2699 Other pulmonary embolism without acute cor pulmonale: Secondary | ICD-10-CM

## 2018-09-20 DIAGNOSIS — Z5181 Encounter for therapeutic drug level monitoring: Secondary | ICD-10-CM | POA: Diagnosis not present

## 2018-09-20 LAB — POCT INR: INR: 7.1 — AB (ref 2.0–3.0)

## 2018-09-20 NOTE — Progress Notes (Signed)
Pt on Keflex since 09/15/18, reports that she has 6 more days left.  Pt No Showed for her last INR check and did not let us know that she was on abx.  Reading today is supratherapeutic @ 7.1; advised pt that my machine is not accurate that high and recommended that [facility] and hopefully the results will be back before I leave @ 5:00. Pt's husband is w/ her and states that he does not want to walk that far and requests that we draw her labs here w/ the understanding that the results will not be back until tomorrow. Advised pt not to take any coumadin until she hears from our [place] office tomorrow and to have a large serving of greens as soon as she leaves the office. Reviewed signs of bleeding to look out for and for pt to proceed to ED if she develops any of these; she verbalizes understanding.   Make Dr. Windell Hummingbird nurse, Rinaldo Cloud, aware to be on the lookout for results and will make colleagues in the [facility] aware, as well.

## 2018-09-20 NOTE — Patient Instructions (Addendum)
Description   Since you have already taken coumadin today, please DO NOT TAKE ANY MORE UNTIL YOU HEAR FROM OUR OFFICE.  Please have a LARGE serving of greens tonight and tomorrow. The labs have been sent to [facility], they will not result until tomorrow, so you will receive a call from our [place] office with your instructions. INR 7.3 from [facility], called pt advised to hold Coumadin until recheck on Monday.  Pt denies bleeding, will go to ED with bleeding issues.  Pt aware of appt recheck on Monday, 2/17 @ 4:00.

## 2018-09-21 ENCOUNTER — Telehealth: Payer: Self-pay | Admitting: Cardiovascular Disease

## 2018-09-21 ENCOUNTER — Other Ambulatory Visit: Payer: Medicare Other

## 2018-09-21 LAB — PROTIME-INR
INR: 7.3 (ref 0.8–1.2)
Prothrombin Time: 63.2 s — ABNORMAL HIGH (ref 9.1–12.0)

## 2018-09-21 NOTE — Telephone Encounter (Signed)
[facility] calling with a critical lab Transferred to [facility]

## 2018-09-21 NOTE — Progress Notes (Signed)
Spoke with pt and gave detailed instructions for Warafrin/Coumadin. Please refer to Anticoagulation Note for further details. Thanks.

## 2018-09-21 NOTE — Telephone Encounter (Signed)
Spoke with Clydie Braun from [facility] and received critical value of INR 7.3. Will route over to anticoag per [facility].

## 2018-09-21 NOTE — Telephone Encounter (Signed)
See encounter from 09/20/18 with updated instructions. [name] discussed dosing with patient earlier today.

## 2018-09-25 ENCOUNTER — Ambulatory Visit (INDEPENDENT_AMBULATORY_CARE_PROVIDER_SITE_OTHER): Payer: Medicare Other

## 2018-09-25 DIAGNOSIS — I480 Paroxysmal atrial fibrillation: Secondary | ICD-10-CM

## 2018-09-25 DIAGNOSIS — I2699 Other pulmonary embolism without acute cor pulmonale: Secondary | ICD-10-CM | POA: Diagnosis not present

## 2018-09-25 DIAGNOSIS — Z5181 Encounter for therapeutic drug level monitoring: Secondary | ICD-10-CM | POA: Diagnosis not present

## 2018-09-25 LAB — POCT INR: INR: 1.3 — AB (ref 2.0–3.0)

## 2018-09-25 NOTE — Patient Instructions (Signed)
Since you are still on Keflex, please resume dosage of 1.5 tablets every day EXCEPT 2 TABLETS ON MONDAYS, WEDNESDAYS & FRIDAYS. Recheck in 1 week.

## 2018-10-02 ENCOUNTER — Ambulatory Visit (INDEPENDENT_AMBULATORY_CARE_PROVIDER_SITE_OTHER): Payer: Medicare Other

## 2018-10-02 DIAGNOSIS — Z5181 Encounter for therapeutic drug level monitoring: Secondary | ICD-10-CM | POA: Diagnosis not present

## 2018-10-02 DIAGNOSIS — I480 Paroxysmal atrial fibrillation: Secondary | ICD-10-CM

## 2018-10-02 DIAGNOSIS — I2699 Other pulmonary embolism without acute cor pulmonale: Secondary | ICD-10-CM

## 2018-10-02 LAB — POCT INR: INR: 2 (ref 2.0–3.0)

## 2018-10-02 NOTE — Patient Instructions (Signed)
Please continue dosage of 1.5 tablets every day EXCEPT 2 TABLETS ON MONDAYS, WEDNESDAYS & FRIDAYS. Recheck in 3 weeks.

## 2018-11-06 ENCOUNTER — Telehealth: Payer: Self-pay

## 2018-11-06 NOTE — Telephone Encounter (Signed)
Left message for pt to call back to schedule INR check during drive thru hours [facility]. Offered her this Wed, 4/1 @ 9:30, 9:45 or 10:30.

## 2018-11-14 NOTE — Telephone Encounter (Signed)
Left message for pt to call and schedule a time to come thru drive thru tomorrow b/t 5-10 for INR check.

## 2018-11-15 NOTE — Telephone Encounter (Signed)
Lmom and sent pt MyChart message asking her to call and schedule INR check.

## 2018-11-22 ENCOUNTER — Ambulatory Visit (INDEPENDENT_AMBULATORY_CARE_PROVIDER_SITE_OTHER): Payer: Medicare Other

## 2018-11-22 ENCOUNTER — Other Ambulatory Visit: Payer: Self-pay

## 2018-11-22 DIAGNOSIS — I2699 Other pulmonary embolism without acute cor pulmonale: Secondary | ICD-10-CM | POA: Diagnosis not present

## 2018-11-22 DIAGNOSIS — Z5181 Encounter for therapeutic drug level monitoring: Secondary | ICD-10-CM

## 2018-11-22 DIAGNOSIS — I480 Paroxysmal atrial fibrillation: Secondary | ICD-10-CM | POA: Diagnosis not present

## 2018-11-22 LAB — POCT INR: INR: 2.2 (ref 2.0–3.0)

## 2018-11-22 NOTE — Patient Instructions (Signed)
Please continue dosage of 1.5 tablets every day EXCEPT 2 TABLETS ON MONDAYS, WEDNESDAYS & FRIDAYS. Recheck in 5 weeks.

## 2018-12-25 ENCOUNTER — Telehealth: Payer: Self-pay

## 2018-12-25 NOTE — Telephone Encounter (Signed)
Virtual Visit Pre-Appointment Phone Call  "[name], I am calling you today to discuss your upcoming appointment. We are currently trying to limit exposure to the virus that causes COVID-19 by seeing patients at home rather than in the office."  1. "What is the BEST phone number to call the day of the visit?" - include this in appointment notes  2. "Do you have or have access to (through a family member/friend) a smartphone with video capability that we can use for your visit?" a. If yes - list this number in appt notes as "cell" (if different from BEST phone #) and list the appointment type as a VIDEO visit in appointment notes b. If no - list the appointment type as a PHONE visit in appointment notes  3. Confirm consent - "In the setting of the current Covid19 crisis, you are scheduled for a video visit with your provider on Dec 27, 2018 at 2:40PM.  Just as we do with many in-office visits, in order for you to participate in this visit, we must obtain consent.  If you'd like, I can send this to your mychart (if signed up) or email for you to review.  Otherwise, I can obtain your verbal consent now.  All virtual visits are billed to your insurance company just like a normal visit would be.  By agreeing to a virtual visit, we'd like you to understand that the technology does not allow for your provider to perform an examination, and thus may limit your provider's ability to fully assess your condition. If your provider identifies any concerns that need to be evaluated in person, we will make arrangements to do so.  Finally, though the technology is pretty good, we cannot assure that it will always work on either your or our end, and in the setting of a video visit, we may have to convert it to a phone-only visit.  In either situation, we cannot ensure that we have a secure connection.  Are you willing to proceed?" STAFF: Did the patient verbally acknowledge consent to telehealth visit? Document YES/NO  here: YES  4. Advise patient to be prepared - "Two hours prior to your appointment, go ahead and check your blood pressure, pulse, oxygen saturation, and your weight (if you have the equipment to check those) and write them all down. When your visit starts, your provider will ask you for this information. If you have an Apple Watch or Kardia device, please plan to have heart rate information ready on the day of your appointment. Please have a pen and paper handy nearby the day of the visit as well."  5. Give patient instructions for MyChart download to smartphone OR Doximity/Doxy.me as below if video visit (depending on what platform provider is using)  6. Inform patient they will receive a phone call 15 minutes prior to their appointment time (may be from unknown caller ID) so they should be prepared to answer    TELEPHONE CALL NOTE  [name] has been deemed a candidate for a follow-up tele-health visit to limit community exposure during the Covid-19 pandemic. I spoke with the patient via phone to ensure availability of phone/video source, confirm preferred email & phone number, and discuss instructions and expectations.  I reminded [name] to be prepared with any vital sign and/or heart rhythm information that could potentially be obtained via home monitoring, at the time of her visit. I reminded [name] to expect a phone call prior to  her visit.  Tommie SamsBrandy L Newcomer McClain 12/25/2018 1:37 PM   INSTRUCTIONS FOR DOWNLOADING THE MYCHART APP TO SMARTPHONE  - The patient must first make sure to have activated MyChart and know their login information - If Apple, go to [employer] Store and type in MyChart in the search bar and download the app. If Android, ask patient to go to [employer] Play Store and type in [place] in the search bar and download the app. The app is free but as with any other app downloads, their phone may require them to verify saved payment information or  Apple/Android password.  - The patient will need to then log into the app with their MyChart username and password, and select [facility] as their healthcare provider to link the account. When it is time for your visit, go to the MyChart app, find appointments, and click Begin Video Visit. Be sure to Select Allow for your device to access the Microphone and Camera for your visit. You will then be connected, and your provider will be with you shortly.  **If they have any issues connecting, or need assistance please contact MyChart service desk [phone])**  **If using a computer, in order to ensure the best quality for their visit they will need to use either of the following Internet Browsers: [employer] Edge, or Google Chrome**  IF USING DOXIMITY or DOXY.ME - The patient will receive a link just prior to their visit by text.     FULL LENGTH CONSENT FOR TELE-HEALTH VISIT   I hereby voluntarily request, consent and authorize [facility] and its employed or contracted physicians, physician assistants, nurse practitioners or other licensed health care professionals (the Practitioner), to provide me with telemedicine health care services (the "Services") as deemed necessary by the treating Practitioner. I acknowledge and consent to receive the Services by the Practitioner via telemedicine. I understand that the telemedicine visit will involve communicating with the Practitioner through live audiovisual communication technology and the disclosure of certain medical information by electronic transmission. I acknowledge that I have been given the opportunity to request an in-person assessment or other available alternative prior to the telemedicine visit and am voluntarily participating in the telemedicine visit.  I understand that I have the right to withhold or withdraw my consent to the use of telemedicine in the course of my care at any time, without affecting my right to future care  or treatment, and that the Practitioner or I may terminate the telemedicine visit at any time. I understand that I have the right to inspect all information obtained and/or recorded in the course of the telemedicine visit and may receive copies of available information for a reasonable fee.  I understand that some of the potential risks of receiving the Services via telemedicine include:  Marland Kitchen. Delay or interruption in medical evaluation due to technological equipment failure or disruption; . Information transmitted may not be sufficient (e.g. poor resolution of images) to allow for appropriate medical decision making by the Practitioner; and/or  . In rare instances, security protocols could fail, causing a breach of personal health information.  Furthermore, I acknowledge that it is my responsibility to provide information about my medical history, conditions and care that is complete and accurate to the best of my ability. I acknowledge that Practitioner's advice, recommendations, and/or decision may be based on factors not within their control, such as incomplete or inaccurate data provided by me or distortions of diagnostic images or specimens that may result from electronic transmissions.  I understand that the practice of medicine is not an exact science and that Practitioner makes no warranties or guarantees regarding treatment outcomes. I acknowledge that I will receive a copy of this consent concurrently upon execution via email to the email address I last provided but may also request a printed copy by calling the office of [facility].    I understand that my insurance will be billed for this visit.   I have read or had this consent read to me. . I understand the contents of this consent, which adequately explains the benefits and risks of the Services being provided via telemedicine.  . I have been provided ample opportunity to ask questions regarding this consent and the Services and have had  my questions answered to my satisfaction. . I give my informed consent for the services to be provided through the use of telemedicine in my medical care  By participating in this telemedicine visit I agree to the above.

## 2018-12-25 NOTE — Telephone Encounter (Addendum)
1. Do you currently have a fever? No 2. Have you recently travelled on a cruise, internationally, or to [place], [place], [place], [place], [place], or [place], [place] [employer]) ? No) 3. Have you been in contact with someone that is currently pending confirmation of Covid19 testing or has been confirmed to have the Covid19 virus?  No 4. Are you currently experiencing fatigue or cough? No  Spoke w/ pt. Advised that we are restricting visitors at this time and anyone present in the vehicle should meet the above criteria as well. Advised that visit will be at curbside for finger stick ONLY and will receive call with instructions. Pt also advised to please bring own pen for signature of arrival document.   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  She was advised that "it's dangerous to be on coumadin right now" and that she should consider switching to alternative thinner, such as Eliquis.  She has virtual visit w/ Dr. Mariah Milling tomorrow afternoon to discuss this.  Advised her to keep appt for INR check on Wed, as we will need to know INR if/when she makes transition. She verbalizes understanding.

## 2018-12-25 NOTE — Telephone Encounter (Signed)
Spoke with patient to schedule 1 year F/U with Dr. Mariah Milling. Patient states she was going to call the office because she has a hemorrhage in her left eye that is causing visual distubance. She saw her optometrist and has been referred to a retina specialist which she will be seeing tomorrow. She states she was told that she may require surgery to remove the blood spots to prevent further complications. Patient is requesting to have coumadin switched to Eliquis at the recommendation of her optometrist. Scheduled patient for appointment with Dr. Mariah Milling on 12/27/18 however patient wanted Dr. Mariah Milling to be aware of her situation beforehand.
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
## 2018-12-27 ENCOUNTER — Ambulatory Visit (INDEPENDENT_AMBULATORY_CARE_PROVIDER_SITE_OTHER): Payer: Medicare Other

## 2018-12-27 ENCOUNTER — Telehealth (INDEPENDENT_AMBULATORY_CARE_PROVIDER_SITE_OTHER): Payer: Medicare Other | Admitting: Cardiovascular Disease

## 2018-12-27 ENCOUNTER — Other Ambulatory Visit: Payer: Self-pay

## 2018-12-27 DIAGNOSIS — I2699 Other pulmonary embolism without acute cor pulmonale: Secondary | ICD-10-CM | POA: Diagnosis not present

## 2018-12-27 DIAGNOSIS — I480 Paroxysmal atrial fibrillation: Secondary | ICD-10-CM

## 2018-12-27 DIAGNOSIS — I25708 Atherosclerosis of coronary artery bypass graft(s), unspecified, with other forms of angina pectoris: Secondary | ICD-10-CM

## 2018-12-27 DIAGNOSIS — I70299 Other atherosclerosis of native arteries of extremities, unspecified extremity: Secondary | ICD-10-CM

## 2018-12-27 DIAGNOSIS — L97909 Non-pressure chronic ulcer of unspecified part of unspecified lower leg with unspecified severity: Secondary | ICD-10-CM

## 2018-12-27 DIAGNOSIS — E1159 Type 2 diabetes mellitus with other circulatory complications: Secondary | ICD-10-CM

## 2018-12-27 DIAGNOSIS — Z5181 Encounter for therapeutic drug level monitoring: Secondary | ICD-10-CM

## 2018-12-27 LAB — POCT INR: INR: 2.7 (ref 2.0–3.0)

## 2018-12-27 MED ORDER — ASPIRIN 81 MG PO TBEC: 81.0000 mg | DELAYED_RELEASE_TABLET | Freq: Every day | ORAL | 12 refills | Status: DC

## 2018-12-27 NOTE — Patient Instructions (Addendum)
Medication Instructions:  Your physician has recommended you make the following change in your medication:  1. STOP Warfarin 2. START Aspirin 81 mg 1 tablet once daily   Refill Crestor 20 mg daily,. 90 pills with refill Start zetia 10 mg daily  [facility]   If you need a refill on your cardiac medications before your next appointment, please call your pharmacy.    Lab work: No new labs needed   If you have labs (blood work) drawn today and your tests are completely normal, you will receive your results only by: Marland Kitchen MyChart Message (if you have MyChart) OR . A paper copy in the mail If you have any lab test that is abnormal or we need to change your treatment, we will call you to review the results.   Testing/Procedures: No new testing needed   Follow-Up: At [facility], you and your health needs are our priority.  As part of our continuing mission to provide you with exceptional heart care, we have created designated Provider Care Teams.  These Care Teams include your primary Cardiologist (physician) and Advanced Practice Providers (APPs -  Physician Assistants and Nurse Practitioners) who all work together to provide you with the care you need, when you need it.  . You will need a follow up appointment in 6 months .   Please call our office 2 months in advance to schedule this appointment.    . Providers on your designated Care Team:   . Nicolasa Ducking, NP . Eula Listen, PA-C . Marisue Ivan, PA-C  Any Other Special Instructions Will Be Listed Below (If Applicable).  For educational health videos Log in to : [email or web address] Or : [email or web address], password : [email or web address]

## 2018-12-27 NOTE — Patient Instructions (Signed)
Per Dr. Mariah Milling, please STOP coumadin and start aspirin 81 mg. It was a pleasure seeing you!  Stay safe!

## 2019-01-11 ENCOUNTER — Ambulatory Visit: Payer: Medicare Other

## 2019-01-12 ENCOUNTER — Ambulatory Visit: Admission: RE | Admit: 2019-01-12 | Payer: Medicare Other | Source: Ambulatory Visit

## 2019-09-14 ENCOUNTER — Telehealth: Payer: Self-pay | Admitting: Cardiovascular Disease

## 2019-09-14 NOTE — Telephone Encounter (Signed)
Patient calling  States that Dr Mariah Milling mentioned and her chart says she has afib and CHF Patient states she does not have these conditions and she would need this documentation removed as it is keeping her from life insurance  Please call to discuss - patient requesting to speak with [facility] first

## 2019-09-14 NOTE — Telephone Encounter (Signed)
error 

## 2019-09-17 NOTE — Telephone Encounter (Signed)
Lmom for pt to call back. 

## 2019-09-17 NOTE — Telephone Encounter (Addendum)
After extensively reviewing pt's chart, I've found the following documentation:  - warfarin started for PE on 01/23/15 by Dr. Mariah Milling  - CHF noted during hospitalization 03/28/17 by Dr. [name] - pt on lasix 01/23/15 for SOB & leg edema - per Alinda Money Arguello's office note 04/27/13, "she states an episode of atrial fibrillation surrounding a prior cath manifested as palpitations" - per Dr. Harvie Bridge note 02/24/11, " note indicates h/o afib", pt previously on Fen-phen - 2009 EKG shows PVCs. - several EKGs in 2016 show trigeminy PVCs - 12/27/14 - "h/o afib" after caths in 2006 I cannot find any documentation of the actual afib in Epic or Care Everywhere.  Spoke w/ pt and provided her w/ [facility] Medical Records # so that she can call them [phone]) and see if they can assist her.  She is appreciative and will call back if we can be of further assistance.

## 2019-10-12 ENCOUNTER — Ambulatory Visit: Payer: Medicare Other | Admitting: Cardiovascular Disease

## 2019-10-15 ENCOUNTER — Encounter (INDEPENDENT_AMBULATORY_CARE_PROVIDER_SITE_OTHER): Payer: Self-pay | Admitting: Vascular Surgery

## 2019-10-15 ENCOUNTER — Other Ambulatory Visit: Payer: Self-pay

## 2019-10-15 ENCOUNTER — Ambulatory Visit (INDEPENDENT_AMBULATORY_CARE_PROVIDER_SITE_OTHER): Payer: Medicare Other | Admitting: Vascular Surgery

## 2019-10-15 VITALS — BP 218/89 | HR 66 | Resp 10 | Ht 68.0 in | Wt 172.0 lb

## 2019-10-15 DIAGNOSIS — E1159 Type 2 diabetes mellitus with other circulatory complications: Secondary | ICD-10-CM

## 2019-10-15 DIAGNOSIS — I25708 Atherosclerosis of coronary artery bypass graft(s), unspecified, with other forms of angina pectoris: Secondary | ICD-10-CM

## 2019-10-15 DIAGNOSIS — I70213 Atherosclerosis of native arteries of extremities with intermittent claudication, bilateral legs: Secondary | ICD-10-CM

## 2019-10-15 DIAGNOSIS — I10 Essential (primary) hypertension: Secondary | ICD-10-CM

## 2019-10-15 DIAGNOSIS — E782 Mixed hyperlipidemia: Secondary | ICD-10-CM

## 2019-10-17 ENCOUNTER — Encounter (INDEPENDENT_AMBULATORY_CARE_PROVIDER_SITE_OTHER): Payer: Self-pay | Admitting: Vascular Surgery
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
## 2019-10-26 ENCOUNTER — Telehealth: Payer: Self-pay | Admitting: Cardiovascular Disease

## 2019-10-26 NOTE — Telephone Encounter (Signed)
  Patient Consent for Virtual Visit         [name] has provided verbal consent on 10/26/2019 for a virtual visit (video or telephone).   CONSENT FOR VIRTUAL VISIT FOR:  [name]  By participating in this virtual visit I agree to the following:  I hereby voluntarily request, consent and authorize [facility] and its employed or contracted physicians, physician assistants, nurse practitioners or other licensed health care professionals (the Practitioner), to provide me with telemedicine health care services (the "Services") as deemed necessary by the treating Practitioner. I acknowledge and consent to receive the Services by the Practitioner via telemedicine. I understand that the telemedicine visit will involve communicating with the Practitioner through live audiovisual communication technology and the disclosure of certain medical information by electronic transmission. I acknowledge that I have been given the opportunity to request an in-person assessment or other available alternative prior to the telemedicine visit and am voluntarily participating in the telemedicine visit.  I understand that I have the right to withhold or withdraw my consent to the use of telemedicine in the course of my care at any time, without affecting my right to future care or treatment, and that the Practitioner or I may terminate the telemedicine visit at any time. I understand that I have the right to inspect all information obtained and/or recorded in the course of the telemedicine visit and may receive copies of available information for a reasonable fee.  I understand that some of the potential risks of receiving the Services via telemedicine include:  Marland Kitchen Delay or interruption in medical evaluation due to technological equipment failure or disruption; . Information transmitted may not be sufficient (e.g. poor resolution of images) to allow for appropriate medical decision making by the Practitioner;  and/or  . In rare instances, security protocols could fail, causing a breach of personal health information.  Furthermore, I acknowledge that it is my responsibility to provide information about my medical history, conditions and care that is complete and accurate to the best of my ability. I acknowledge that Practitioner's advice, recommendations, and/or decision may be based on factors not within their control, such as incomplete or inaccurate data provided by me or distortions of diagnostic images or specimens that may result from electronic transmissions. I understand that the practice of medicine is not an exact science and that Practitioner makes no warranties or guarantees regarding treatment outcomes. I acknowledge that a copy of this consent can be made available to me via my patient portal [facility] MyChart), or I can request a printed copy by calling the office of [facility].    I understand that my insurance will be billed for this visit.   I have read or had this consent read to me. . I understand the contents of this consent, which adequately explains the benefits and risks of the Services being provided via telemedicine.  . I have been provided ample opportunity to ask questions regarding this consent and the Services and have had my questions answered to my satisfaction. . I give my informed consent for the services to be provided through the use of telemedicine in my medical care

## 2019-10-30 ENCOUNTER — Ambulatory Visit: Payer: Medicare Other | Admitting: Cardiovascular Disease

## 2019-11-05 ENCOUNTER — Encounter (INDEPENDENT_AMBULATORY_CARE_PROVIDER_SITE_OTHER): Payer: Self-pay | Admitting: Vascular Surgery

## 2019-11-05 ENCOUNTER — Ambulatory Visit (INDEPENDENT_AMBULATORY_CARE_PROVIDER_SITE_OTHER): Payer: Medicare Other

## 2019-11-05 ENCOUNTER — Ambulatory Visit (INDEPENDENT_AMBULATORY_CARE_PROVIDER_SITE_OTHER): Payer: Medicare Other | Admitting: Vascular Surgery

## 2019-11-05 ENCOUNTER — Other Ambulatory Visit: Payer: Self-pay

## 2019-11-05 VITALS — BP 181/73 | HR 70 | Resp 16 | Wt 178.0 lb

## 2019-11-05 DIAGNOSIS — E782 Mixed hyperlipidemia: Secondary | ICD-10-CM

## 2019-11-05 DIAGNOSIS — I70213 Atherosclerosis of native arteries of extremities with intermittent claudication, bilateral legs: Secondary | ICD-10-CM | POA: Diagnosis not present

## 2019-11-05 DIAGNOSIS — E1159 Type 2 diabetes mellitus with other circulatory complications: Secondary | ICD-10-CM

## 2019-11-05 DIAGNOSIS — I10 Essential (primary) hypertension: Secondary | ICD-10-CM

## 2019-11-05 DIAGNOSIS — I25708 Atherosclerosis of coronary artery bypass graft(s), unspecified, with other forms of angina pectoris: Secondary | ICD-10-CM
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
## 2019-11-10 ENCOUNTER — Encounter (INDEPENDENT_AMBULATORY_CARE_PROVIDER_SITE_OTHER): Payer: Self-pay | Admitting: Vascular Surgery
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
## 2019-11-21 ENCOUNTER — Encounter: Payer: Self-pay | Admitting: Cardiovascular Disease

## 2019-11-21 ENCOUNTER — Telehealth (INDEPENDENT_AMBULATORY_CARE_PROVIDER_SITE_OTHER): Payer: Medicare Other | Admitting: Cardiovascular Disease

## 2019-11-21 ENCOUNTER — Other Ambulatory Visit: Payer: Self-pay

## 2019-11-21 VITALS — BP 120/70 | HR 71 | Ht 68.0 in | Wt 175.1 lb

## 2019-11-21 DIAGNOSIS — Z7982 Long term (current) use of aspirin: Secondary | ICD-10-CM | POA: Diagnosis not present

## 2019-11-21 DIAGNOSIS — I2699 Other pulmonary embolism without acute cor pulmonale: Secondary | ICD-10-CM

## 2019-11-21 DIAGNOSIS — I25708 Atherosclerosis of coronary artery bypass graft(s), unspecified, with other forms of angina pectoris: Secondary | ICD-10-CM | POA: Diagnosis not present

## 2019-11-21 DIAGNOSIS — I70299 Other atherosclerosis of native arteries of extremities, unspecified extremity: Secondary | ICD-10-CM

## 2019-11-21 DIAGNOSIS — L97909 Non-pressure chronic ulcer of unspecified part of unspecified lower leg with unspecified severity: Secondary | ICD-10-CM

## 2019-11-21 DIAGNOSIS — I480 Paroxysmal atrial fibrillation: Secondary | ICD-10-CM | POA: Diagnosis not present

## 2019-11-21 DIAGNOSIS — E1159 Type 2 diabetes mellitus with other circulatory complications: Secondary | ICD-10-CM

## 2019-11-21 MED ORDER — FUROSEMIDE 20 MG PO TABS: 20.0000 mg | ORAL_TABLET | Freq: Every day | ORAL | 4 refills | Status: DC | PRN

## 2019-11-21 MED ORDER — POTASSIUM CHLORIDE ER 10 MEQ PO TBCR: EXTENDED_RELEASE_TABLET | ORAL | 4 refills | Status: DC

## 2019-11-21 MED ORDER — ROSUVASTATIN CALCIUM 20 MG PO TABS: 20.0000 mg | ORAL_TABLET | Freq: Every day | ORAL | 3 refills | Status: DC

## 2019-11-21 MED ORDER — ATENOLOL 100 MG PO TABS: ORAL_TABLET | ORAL | 3 refills | Status: DC

## 2019-11-21 NOTE — Patient Instructions (Signed)

## 2020-02-27 ENCOUNTER — Other Ambulatory Visit: Payer: Self-pay

## 2020-02-27 ENCOUNTER — Encounter: Payer: Self-pay | Admitting: Orthopedic Surgery

## 2020-02-27 ENCOUNTER — Inpatient Hospital Stay
Admission: AD | Admit: 2020-02-27 | Discharge: 2020-02-29 | DRG: 580 | Disposition: A | Discharge status: Home / Self Care | Payer: Medicare Other | Source: Ambulatory Visit | Attending: Orthopedic Surgery | Admitting: Orthopedic Surgery

## 2020-02-27 DIAGNOSIS — I25810 Atherosclerosis of coronary artery bypass graft(s) without angina pectoris: Secondary | ICD-10-CM | POA: Diagnosis present

## 2020-02-27 DIAGNOSIS — Z86711 Personal history of pulmonary embolism: Secondary | ICD-10-CM

## 2020-02-27 DIAGNOSIS — F419 Anxiety disorder, unspecified: Secondary | ICD-10-CM | POA: Diagnosis present

## 2020-02-27 DIAGNOSIS — I4891 Unspecified atrial fibrillation: Secondary | ICD-10-CM | POA: Diagnosis present

## 2020-02-27 DIAGNOSIS — Z8249 Family history of ischemic heart disease and other diseases of the circulatory system: Secondary | ICD-10-CM

## 2020-02-27 DIAGNOSIS — Z20822 Contact with and (suspected) exposure to covid-19: Secondary | ICD-10-CM | POA: Diagnosis present

## 2020-02-27 DIAGNOSIS — L02511 Cutaneous abscess of right hand: Principal | ICD-10-CM

## 2020-02-27 DIAGNOSIS — I2510 Atherosclerotic heart disease of native coronary artery without angina pectoris: Secondary | ICD-10-CM | POA: Diagnosis present

## 2020-02-27 DIAGNOSIS — Z955 Presence of coronary angioplasty implant and graft: Secondary | ICD-10-CM

## 2020-02-27 DIAGNOSIS — E785 Hyperlipidemia, unspecified: Secondary | ICD-10-CM | POA: Diagnosis present

## 2020-02-27 DIAGNOSIS — I110 Hypertensive heart disease with heart failure: Secondary | ICD-10-CM | POA: Diagnosis present

## 2020-02-27 DIAGNOSIS — I5032 Chronic diastolic (congestive) heart failure: Secondary | ICD-10-CM | POA: Diagnosis present

## 2020-02-27 DIAGNOSIS — Z888 Allergy status to other drugs, medicaments and biological substances status: Secondary | ICD-10-CM

## 2020-02-27 DIAGNOSIS — E1151 Type 2 diabetes mellitus with diabetic peripheral angiopathy without gangrene: Secondary | ICD-10-CM | POA: Diagnosis present

## 2020-02-27 DIAGNOSIS — Z885 Allergy status to narcotic agent status: Secondary | ICD-10-CM

## 2020-02-27 LAB — COMPREHENSIVE METABOLIC PANEL
ALT: 20 U/L (ref 0–44)
AST: 18 U/L (ref 15–41)
Albumin: 4.2 g/dL (ref 3.5–5.0)
Alkaline Phosphatase: 121 U/L (ref 38–126)
Anion gap: 11 (ref 5–15)
BUN: 21 mg/dL (ref 8–23)
CO2: 25 mmol/L (ref 22–32)
Calcium: 9.2 mg/dL (ref 8.9–10.3)
Chloride: 97 mmol/L — ABNORMAL LOW (ref 98–111)
Creatinine, Ser: 0.98 mg/dL (ref 0.44–1.00)
GFR calc Af Amer: >60 mL/min (ref >60)
GFR calc non Af Amer: 59 mL/min — ABNORMAL LOW (ref >60)
Glucose, Bld: 262 mg/dL — ABNORMAL HIGH (ref 70–99)
Potassium: 4.2 mmol/L (ref 3.5–5.1)
Sodium: 133 mmol/L — ABNORMAL LOW (ref 135–145)
Total Bilirubin: 0.8 mg/dL (ref 0.3–1.2)
Total Protein: 7.7 g/dL (ref 6.5–8.1)

## 2020-02-27 LAB — CBC WITH DIFFERENTIAL/PLATELET
Abs Immature Granulocytes: 0.03 10*3/uL (ref 0.00–0.07)
Basophils Absolute: 0 10*3/uL (ref 0.0–0.1)
Basophils Relative: 1 %
Eosinophils Absolute: 0.1 10*3/uL (ref 0.0–0.5)
Eosinophils Relative: 1 %
HCT: 36 % (ref 36.0–46.0)
Hemoglobin: 12.3 g/dL (ref 12.0–15.0)
Immature Granulocytes: 0 %
Lymphocytes Relative: 16 %
Lymphs Abs: 1.4 10*3/uL (ref 0.7–4.0)
MCH: 29.5 pg (ref 26.0–34.0)
MCHC: 34.2 g/dL (ref 30.0–36.0)
MCV: 86.3 fL (ref 80.0–100.0)
Monocytes Absolute: 0.7 10*3/uL (ref 0.1–1.0)
Monocytes Relative: 8 %
Neutro Abs: 6.5 10*3/uL (ref 1.7–7.7)
Neutrophils Relative %: 74 %
Platelets: 232 10*3/uL (ref 150–400)
RBC: 4.17 MIL/uL (ref 3.87–5.11)
RDW: 12.8 % (ref 11.5–15.5)
WBC: 8.7 10*3/uL (ref 4.0–10.5)
nRBC: 0 % (ref 0.0–0.2)

## 2020-02-27 LAB — GLUCOSE, CAPILLARY
Glucose-Capillary: 239 mg/dL — ABNORMAL HIGH (ref 70–99)
Glucose-Capillary: 121 mg/dL — ABNORMAL HIGH (ref 70–99)

## 2020-02-27 LAB — SARS CORONAVIRUS 2 BY RT PCR (HOSPITAL ORDER, PERFORMED IN ~~LOC~~ HOSPITAL LAB): SARS Coronavirus 2: NEGATIVE

## 2020-02-27 MED ORDER — ZOLPIDEM TARTRATE 5 MG PO TABS: 10.0000 mg | ORAL_TABLET | Freq: Every day | ORAL | Status: DC

## 2020-02-27 MED ORDER — FUROSEMIDE 20 MG PO TABS: 20.0000 mg | ORAL_TABLET | Freq: Every day | ORAL | Status: DC | PRN

## 2020-02-27 MED ORDER — POTASSIUM CHLORIDE CRYS ER 10 MEQ PO TBCR
10.0000 meq | EXTENDED_RELEASE_TABLET | Freq: Every day | ORAL | Status: DC
  Administered 2020-02-27 – 2020-02-29 (×2): 10 meq via ORAL
  Filled 2020-02-27 (×2): qty 1

## 2020-02-27 MED ORDER — MORPHINE SULFATE (PF) 2 MG/ML IV SOLN: 0.5000 mg | INTRAVENOUS | Status: DC | PRN

## 2020-02-27 MED ORDER — CEFAZOLIN SODIUM-DEXTROSE 2-4 GM/100ML-% IV SOLN
2.0000 g | Freq: Four times a day (QID) | INTRAVENOUS | Status: DC
  Administered 2020-02-27 – 2020-02-28 (×3): 2 g via INTRAVENOUS
  Filled 2020-02-27 (×6): qty 100

## 2020-02-27 MED ORDER — ONDANSETRON HCL 4 MG/2ML IJ SOLN
4.0000 mg | Freq: Four times a day (QID) | INTRAMUSCULAR | Status: DC | PRN
  Administered 2020-02-28: 4 mg via INTRAVENOUS

## 2020-02-27 MED ORDER — QUETIAPINE FUMARATE 25 MG PO TABS
50.0000 mg | ORAL_TABLET | Freq: Every day | ORAL | Status: DC
  Administered 2020-02-27 – 2020-02-28 (×2): 50 mg via ORAL
  Filled 2020-02-27 (×2): qty 2

## 2020-02-27 MED ORDER — FAMOTIDINE 20 MG PO TABS: 20.0000 mg | ORAL_TABLET | Freq: Two times a day (BID) | ORAL | Status: DC | PRN

## 2020-02-27 MED ORDER — NITROGLYCERIN 0.4 MG SL SUBL: 0.4000 mg | SUBLINGUAL_TABLET | SUBLINGUAL | Status: DC | PRN

## 2020-02-27 MED ORDER — ZOLPIDEM TARTRATE 5 MG PO TABS: 5.0000 mg | ORAL_TABLET | Freq: Every day | ORAL | Status: DC

## 2020-02-27 MED ORDER — DOCUSATE SODIUM 100 MG PO CAPS
100.0000 mg | ORAL_CAPSULE | Freq: Two times a day (BID) | ORAL | Status: DC
  Filled 2020-02-27 (×3): qty 1

## 2020-02-27 MED ORDER — ASPIRIN EC 81 MG PO TBEC
81.0000 mg | DELAYED_RELEASE_TABLET | Freq: Every day | ORAL | Status: DC
  Administered 2020-02-27 – 2020-02-29 (×2): 81 mg via ORAL
  Filled 2020-02-27 (×2): qty 1

## 2020-02-27 MED ORDER — ROSUVASTATIN CALCIUM 10 MG PO TABS
20.0000 mg | ORAL_TABLET | Freq: Every day | ORAL | Status: DC
  Filled 2020-02-27 (×2): qty 2

## 2020-02-27 MED ORDER — SODIUM CHLORIDE 0.9 % IV SOLN: 3.0000 g | Freq: Four times a day (QID) | INTRAVENOUS | Status: DC

## 2020-02-27 MED ORDER — ZOLPIDEM TARTRATE 5 MG PO TABS: 5.0000 mg | ORAL_TABLET | Freq: Every evening | ORAL | Status: DC | PRN

## 2020-02-27 MED ORDER — ATENOLOL 25 MG PO TABS
100.0000 mg | ORAL_TABLET | Freq: Every day | ORAL | Status: DC
  Administered 2020-02-27 – 2020-02-29 (×3): 100 mg via ORAL
  Filled 2020-02-27 (×3): qty 4

## 2020-02-27 MED ORDER — INSULIN ASPART 100 UNIT/ML ~~LOC~~ SOLN
0.0000 [IU] | Freq: Three times a day (TID) | SUBCUTANEOUS | Status: DC
  Administered 2020-02-27: 5 [IU] via SUBCUTANEOUS
  Administered 2020-02-28: 2 [IU] via SUBCUTANEOUS
  Administered 2020-02-28: 8 [IU] via SUBCUTANEOUS
  Filled 2020-02-27 (×3): qty 1

## 2020-02-27 MED ORDER — ONDANSETRON HCL 4 MG PO TABS: 4.0000 mg | ORAL_TABLET | Freq: Four times a day (QID) | ORAL | Status: DC | PRN

## 2020-02-27 MED ORDER — ACETAMINOPHEN 325 MG PO TABS: 325.0000 mg | ORAL_TABLET | Freq: Four times a day (QID) | ORAL | Status: DC | PRN

## 2020-02-27 MED ORDER — HYDROCODONE-ACETAMINOPHEN 7.5-325 MG PO TABS: 1.0000 | ORAL_TABLET | ORAL | Status: DC | PRN

## 2020-02-27 MED ORDER — TRAMADOL HCL 50 MG PO TABS
50.0000 mg | ORAL_TABLET | Freq: Four times a day (QID) | ORAL | Status: DC
  Administered 2020-02-27 – 2020-02-29 (×6): 50 mg via ORAL
  Filled 2020-02-27 (×6): qty 1

## 2020-02-27 MED ORDER — HYDROCODONE-ACETAMINOPHEN 5-325 MG PO TABS
1.0000 | ORAL_TABLET | ORAL | Status: DC | PRN
  Administered 2020-02-28 (×2): 1 via ORAL
  Administered 2020-02-29: 2 via ORAL
  Filled 2020-02-27 (×3): qty 2

## 2020-02-27 MED ORDER — SODIUM CHLORIDE 0.9 % IV SOLN: INTRAVENOUS | Status: DC
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
## 2020-02-28 ENCOUNTER — Encounter: Payer: Self-pay | Admitting: Orthopedic Surgery

## 2020-02-28 ENCOUNTER — Observation Stay: Payer: Medicare Other | Admitting: Certified Registered"

## 2020-02-28 ENCOUNTER — Encounter
Admission: AD | Disposition: A | Discharge status: Home / Self Care | Payer: Self-pay | Source: Ambulatory Visit | Attending: Orthopedic Surgery

## 2020-02-28 ENCOUNTER — Inpatient Hospital Stay: Admission: RE | Admit: 2020-02-28 | Payer: Medicare Other | Source: Home / Self Care | Admitting: Orthopedic Surgery

## 2020-02-28 DIAGNOSIS — F419 Anxiety disorder, unspecified: Secondary | ICD-10-CM | POA: Diagnosis present

## 2020-02-28 DIAGNOSIS — E1151 Type 2 diabetes mellitus with diabetic peripheral angiopathy without gangrene: Secondary | ICD-10-CM | POA: Diagnosis present

## 2020-02-28 DIAGNOSIS — L02511 Cutaneous abscess of right hand: Secondary | ICD-10-CM | POA: Diagnosis present

## 2020-02-28 DIAGNOSIS — Z8249 Family history of ischemic heart disease and other diseases of the circulatory system: Secondary | ICD-10-CM | POA: Diagnosis not present

## 2020-02-28 DIAGNOSIS — I5032 Chronic diastolic (congestive) heart failure: Secondary | ICD-10-CM | POA: Diagnosis present

## 2020-02-28 DIAGNOSIS — I4891 Unspecified atrial fibrillation: Secondary | ICD-10-CM | POA: Diagnosis present

## 2020-02-28 DIAGNOSIS — Z885 Allergy status to narcotic agent status: Secondary | ICD-10-CM | POA: Diagnosis not present

## 2020-02-28 DIAGNOSIS — Z955 Presence of coronary angioplasty implant and graft: Secondary | ICD-10-CM | POA: Diagnosis not present

## 2020-02-28 DIAGNOSIS — Z20822 Contact with and (suspected) exposure to covid-19: Secondary | ICD-10-CM | POA: Diagnosis present

## 2020-02-28 DIAGNOSIS — I110 Hypertensive heart disease with heart failure: Secondary | ICD-10-CM | POA: Diagnosis present

## 2020-02-28 DIAGNOSIS — E785 Hyperlipidemia, unspecified: Secondary | ICD-10-CM | POA: Diagnosis present

## 2020-02-28 DIAGNOSIS — I25810 Atherosclerosis of coronary artery bypass graft(s) without angina pectoris: Secondary | ICD-10-CM | POA: Diagnosis present

## 2020-02-28 DIAGNOSIS — Z86711 Personal history of pulmonary embolism: Secondary | ICD-10-CM | POA: Diagnosis not present

## 2020-02-28 DIAGNOSIS — Z888 Allergy status to other drugs, medicaments and biological substances status: Secondary | ICD-10-CM | POA: Diagnosis not present

## 2020-02-28 DIAGNOSIS — I2510 Atherosclerotic heart disease of native coronary artery without angina pectoris: Secondary | ICD-10-CM | POA: Diagnosis present

## 2020-02-28 HISTORY — PX: I & D EXTREMITY: SHX5045

## 2020-02-28 LAB — GLUCOSE, CAPILLARY
Glucose-Capillary: 139 mg/dL — ABNORMAL HIGH (ref 70–99)
Glucose-Capillary: 140 mg/dL — ABNORMAL HIGH (ref 70–99)
Glucose-Capillary: 258 mg/dL — ABNORMAL HIGH (ref 70–99)
Glucose-Capillary: 278 mg/dL — ABNORMAL HIGH (ref 70–99)
Glucose-Capillary: 306 mg/dL — ABNORMAL HIGH (ref 70–99)

## 2020-02-28 LAB — MRSA PCR SCREENING: MRSA by PCR: NEGATIVE

## 2020-02-28 SURGERY — IRRIGATION AND DEBRIDEMENT EXTREMITY
Anesthesia: General | Site: Thumb | Laterality: Right

## 2020-02-28 MED ORDER — GLYCOPYRROLATE 0.2 MG/ML IJ SOLN
INTRAMUSCULAR | Status: DC | PRN
  Administered 2020-02-28: .2 mg via INTRAVENOUS

## 2020-02-28 MED ORDER — DEXAMETHASONE SODIUM PHOSPHATE 10 MG/ML IJ SOLN
INTRAMUSCULAR | Status: DC | PRN
  Administered 2020-02-28: 10 mg via INTRAVENOUS

## 2020-02-28 MED ORDER — HYDRALAZINE HCL 25 MG PO TABS
25.0000 mg | ORAL_TABLET | Freq: Two times a day (BID) | ORAL | Status: DC
  Filled 2020-02-28 (×2): qty 1

## 2020-02-28 MED ORDER — INSULIN ASPART 100 UNIT/ML ~~LOC~~ SOLN: 0.0000 [IU] | Freq: Three times a day (TID) | SUBCUTANEOUS | Status: DC

## 2020-02-28 MED ORDER — ONDANSETRON HCL 4 MG/2ML IJ SOLN: 4.0000 mg | Freq: Once | INTRAMUSCULAR | Status: AC | PRN

## 2020-02-28 MED ORDER — EPHEDRINE SULFATE 50 MG/ML IJ SOLN
INTRAMUSCULAR | Status: DC | PRN
  Administered 2020-02-28: 25 mg via INTRAVENOUS

## 2020-02-28 MED ORDER — FENTANYL CITRATE (PF) 100 MCG/2ML IJ SOLN: 25.0000 ug | INTRAMUSCULAR | Status: DC | PRN

## 2020-02-28 MED ORDER — CLONIDINE HCL 0.1 MG PO TABS
0.2000 mg | ORAL_TABLET | Freq: Once | ORAL | Status: AC
  Administered 2020-02-28: 0.2 mg via ORAL
  Filled 2020-02-28: qty 2

## 2020-02-28 MED ORDER — CEFAZOLIN SODIUM-DEXTROSE 2-4 GM/100ML-% IV SOLN
INTRAVENOUS | Status: AC
  Administered 2020-02-28: 2 g via INTRAVENOUS
  Filled 2020-02-28: qty 100

## 2020-02-28 MED ORDER — BUPIVACAINE HCL 0.5 % IJ SOLN
INTRAMUSCULAR | Status: DC | PRN
  Administered 2020-02-28: 20 mL

## 2020-02-28 MED ORDER — LABETALOL HCL 5 MG/ML IV SOLN
INTRAVENOUS | Status: AC
  Administered 2020-02-28: 5 mg via INTRAVENOUS
  Filled 2020-02-28: qty 4

## 2020-02-28 MED ORDER — INSULIN ASPART 100 UNIT/ML ~~LOC~~ SOLN
11.0000 [IU] | Freq: Once | SUBCUTANEOUS | Status: AC
  Administered 2020-02-28: 11 [IU] via SUBCUTANEOUS
  Filled 2020-02-28: qty 1

## 2020-02-28 MED ORDER — MIDAZOLAM HCL 2 MG/2ML IJ SOLN
INTRAMUSCULAR | Status: DC | PRN
  Administered 2020-02-28: 2 mg via INTRAVENOUS

## 2020-02-28 MED ORDER — ACETAMINOPHEN 10 MG/ML IV SOLN
INTRAVENOUS | Status: DC | PRN
  Administered 2020-02-28: 1000 mg via INTRAVENOUS

## 2020-02-28 MED ORDER — LABETALOL HCL 5 MG/ML IV SOLN
5.0000 mg | INTRAVENOUS | Status: AC | PRN
  Administered 2020-02-28: 5 mg via INTRAVENOUS
  Filled 2020-02-28 (×3): qty 4

## 2020-02-28 MED ORDER — ENOXAPARIN SODIUM 40 MG/0.4ML ~~LOC~~ SOLN
40.0000 mg | SUBCUTANEOUS | Status: DC
  Administered 2020-02-29: 40 mg via SUBCUTANEOUS
  Filled 2020-02-28: qty 0.4

## 2020-02-28 MED ORDER — CHLORHEXIDINE GLUCONATE CLOTH 2 % EX PADS: 6.0000 | MEDICATED_PAD | Freq: Every day | CUTANEOUS | Status: DC

## 2020-02-28 MED ORDER — METRONIDAZOLE IN NACL 5-0.79 MG/ML-% IV SOLN
500.0000 mg | Freq: Three times a day (TID) | INTRAVENOUS | Status: DC
  Administered 2020-02-28 – 2020-02-29 (×3): 500 mg via INTRAVENOUS
  Filled 2020-02-28 (×5): qty 100

## 2020-02-28 MED ORDER — FENTANYL CITRATE (PF) 100 MCG/2ML IJ SOLN
INTRAMUSCULAR | Status: DC | PRN
  Administered 2020-02-28 (×2): 50 ug via INTRAVENOUS

## 2020-02-28 MED ORDER — CEFAZOLIN SODIUM-DEXTROSE 2-4 GM/100ML-% IV SOLN
2.0000 g | Freq: Three times a day (TID) | INTRAVENOUS | Status: DC
  Administered 2020-02-28 – 2020-02-29 (×2): 2 g via INTRAVENOUS
  Filled 2020-02-28 (×4): qty 100

## 2020-02-28 MED ORDER — PROPOFOL 500 MG/50ML IV EMUL
INTRAVENOUS | Status: DC | PRN
  Administered 2020-02-28: 200 mg via INTRAVENOUS

## 2020-02-28 MED ORDER — ONDANSETRON HCL 4 MG/2ML IJ SOLN
INTRAMUSCULAR | Status: AC
  Administered 2020-02-28: 4 mg via INTRAVENOUS
  Filled 2020-02-28: qty 2

## 2020-02-28 SURGICAL SUPPLY — 27 items
APL PRP STRL LF ISPRP CHG 10.5 (MISCELLANEOUS)
APPLICATOR CHLORAPREP 10.5 ORG (MISCELLANEOUS) ×1 IMPLANT
BNDG CONFORM 2 STRL LF (GAUZE/BANDAGES/DRESSINGS) ×1 IMPLANT
BNDG ELASTIC 2X5.8 VLCR STR LF (GAUZE/BANDAGES/DRESSINGS) ×1 IMPLANT
CANISTER SUCT 1200ML W/VALVE (MISCELLANEOUS) ×2 IMPLANT
COVER WAND RF STERILE (DRAPES) ×2 IMPLANT
CUFF TOURN SGL QUICK 24 (TOURNIQUET CUFF)
CUFF TOURN SGL QUICK 30 (TOURNIQUET CUFF)
CUFF TRNQT CYL 24X4X16.5-23 (TOURNIQUET CUFF) IMPLANT
CUFF TRNQT CYL 30X4X21-28X (TOURNIQUET CUFF) IMPLANT
ELECT REM PT RETURN 9FT ADLT (ELECTROSURGICAL) ×2
ELECTRODE REM PT RTRN 9FT ADLT (ELECTROSURGICAL) ×1 IMPLANT
GAUZE SPONGE 4X4 12PLY STRL (GAUZE/BANDAGES/DRESSINGS) ×3 IMPLANT
GAUZE XEROFORM 1X8 LF (GAUZE/BANDAGES/DRESSINGS) ×2 IMPLANT
GLOVE SURG SYN 9.0  PF PI (GLOVE) ×2
GLOVE SURG SYN 9.0 PF PI (GLOVE) ×1 IMPLANT
GOWN STRL REUS W/ TWL LRG LVL3 (GOWN DISPOSABLE) ×1 IMPLANT
GOWN STRL REUS W/TWL LRG LVL3 (GOWN DISPOSABLE) ×2
KIT TURNOVER KIT A (KITS) ×2 IMPLANT
NS IRRIG 500ML POUR BTL (IV SOLUTION) ×2 IMPLANT
PACK EXTREMITY (MISCELLANEOUS) ×2 IMPLANT
SCALPEL PROTECTED #15 DISP (BLADE) ×4 IMPLANT
SOL PREP PROV IODINE SCRUB 4OZ (MISCELLANEOUS) ×1 IMPLANT
STRAP SAFETY 5IN WIDE (MISCELLANEOUS) ×2 IMPLANT
SUT ETHILON 4-0 (SUTURE)
SUT ETHILON 4-0 FS2 18XMFL BLK (SUTURE)
SUTURE ETHLN 4-0 FS2 18XMF BLK (SUTURE) ×1 IMPLANT

## 2020-02-28 NOTE — Transfer of Care (Signed)
Immediate Anesthesia Transfer of Care Note  Patient: Shakenna Herrero [place]  Procedure(s) Performed: IRRIGATION AND DEBRIDEMENT right thumb (Right Thumb)  Patient Location: PACU  Anesthesia Type:General  Level of Consciousness: sedated  Airway & Oxygen Therapy: Patient Spontanous Breathing and Patient connected to face mask oxygen  Post-op Assessment: Report given to RN and Post -op Vital signs reviewed and stable  Post vital signs: Reviewed and stable  Last Vitals:  Vitals Value Taken Time  BP 116/50 02/28/20 1119  Temp 36.3 C 02/28/20 1118  Pulse 54 02/28/20 1120  Resp 7 02/28/20 1120  SpO2 100 % 02/28/20 1120  Vitals shown include unvalidated device data.  Last Pain:  Vitals:   02/28/20 0930  TempSrc: Tympanic  PainSc: 8          Complications: No complications documented.

## 2020-02-28 NOTE — Anesthesia Procedure Notes (Signed)
Procedure Name: LMA Insertion Performed by: Madora Barletta R, CRNA Pre-anesthesia Checklist: Patient identified, Emergency Drugs available, Suction available and Patient being monitored Patient Re-evaluated:Patient Re-evaluated prior to induction Oxygen Delivery Method: Circle system utilized Preoxygenation: Pre-oxygenation with 100% oxygen Induction Type: IV induction LMA Size: 4.0 Tube type: Oral Number of attempts: 1 Placement Confirmation: positive ETCO2 and breath sounds checked- equal and bilateral Dental Injury: Teeth and Oropharynx as per pre-operative assessment        

## 2020-02-28 NOTE — Progress Notes (Signed)
Pt BS [phone]. MD paged.

## 2020-02-28 NOTE — Op Note (Signed)
02/27/2020 - 02/28/2020  10:57 AM  PATIENT:  [name]  69 y.o. female  PRE-OPERATIVE DIAGNOSIS:  Abscess Right Thumb  POST-OPERATIVE DIAGNOSIS:  Abscess Right Thumb  PROCEDURE:  Procedure(s): IRRIGATION AND DEBRIDEMENT right thumb (Right)  SURGEON: Leitha Schuller, MD  ASSISTANTS: None  ANESTHESIA:   general  EBL:  Total I/O In: -  Out: 1 [Blood:1]  BLOOD ADMINISTERED:none  DRAINS: none   LOCAL MEDICATIONS USED:  MARCAINE     SPECIMEN:  Source of Specimen:  Culture abscess phone  DISPOSITION OF SPECIMEN:  Microbiology  COUNTS:  YES  TOURNIQUET:  * No tourniquets in log *  IMPLANTS: None  DICTATION: .Dragon Dictation patient was brought to the operating room and after adequate anesthesia was obtained the right arm was prepped and draped in the usual sterile fashion.  After patient identification and timeout procedures were completed a digital block was performed for postop analgesia using a total of 20 cc of half percent Sensorcaine plain.  The skin at the base of the nail was then elevated with the devitalized skin with infection at the radial side also debrided and the nail was removed without difficulty.  The distal half of the nail had some did tissue destruction consistent with infection.  There was no abscess opened medial or lateral despite small incision so no felon was present.  The infection appeared to be more dorsal and appeared to come from under the nail fold but again no gross abscess identified.  The wound was thoroughly irrigated after debriding the devitalized tissue with scalpel and curette.  The wound was then dressed with Xeroform packed underneath the nailbed to allow for drainage 4 x 4's web roll and Ace wrap.  Culture was obtained at the start of the case with foul-smelling material being noted  PLAN OF CARE: Continue as inpatient  PATIENT DISPOSITION:  PACU - hemodynamically stable.

## 2020-02-28 NOTE — Anesthesia Preprocedure Evaluation (Signed)
Anesthesia Evaluation  Patient identified by MRN, date of birth, ID band Patient awake    Reviewed: Allergy & Precautions, H&P , NPO status , Patient's Chart, lab work & pertinent test results, reviewed documented beta blocker date and time   History of Anesthesia Complications (+) history of anesthetic complications  Airway Mallampati: II  TM Distance: >3 FB Neck ROM: full    Dental  (+) Teeth Intact   Pulmonary neg pulmonary ROS,    Pulmonary exam normal        Cardiovascular Exercise Tolerance: Good hypertension, On Medications (-) angina+ CAD, + Peripheral Vascular Disease and +CHF  Normal cardiovascular exam+ dysrhythmias  Rate:Normal     Neuro/Psych PSYCHIATRIC DISORDERS Anxiety Depression negative neurological ROS     GI/Hepatic negative GI ROS, Neg liver ROS,   Endo/Other  negative endocrine ROSdiabetes, Well Controlled  Renal/GU negative Renal ROS  negative genitourinary   Musculoskeletal   Abdominal   Peds  Hematology  (+) Blood dyscrasia, anemia ,   Anesthesia Other Findings   Reproductive/Obstetrics negative OB ROS                             Anesthesia Physical Anesthesia Plan  ASA: III  Anesthesia Plan: General LMA   Post-op Pain Management:    Induction:   PONV Risk Score and Plan: 4 or greater  Airway Management Planned:   Additional Equipment:   Intra-op Plan:   Post-operative Plan:   Informed Consent: I have reviewed the patients History and Physical, chart, labs and discussed the procedure including the risks, benefits and alternatives for the proposed anesthesia with the patient or authorized representative who has indicated his/her understanding and acceptance.       Plan Discussed with: CRNA  Anesthesia Plan Comments:         Anesthesia Quick Evaluation

## 2020-02-29 ENCOUNTER — Encounter: Payer: Self-pay | Admitting: Orthopedic Surgery

## 2020-02-29 ENCOUNTER — Telehealth: Payer: Self-pay | Admitting: Cardiovascular Disease

## 2020-02-29 LAB — GLUCOSE, CAPILLARY
Glucose-Capillary: 140 mg/dL — ABNORMAL HIGH (ref 70–99)
Glucose-Capillary: 109 mg/dL — ABNORMAL HIGH (ref 70–99)

## 2020-02-29 MED ORDER — CEPHALEXIN 500 MG PO CAPS: 500.0000 mg | ORAL_CAPSULE | Freq: Four times a day (QID) | ORAL | 0 refills | Status: AC

## 2020-02-29 MED ORDER — METRONIDAZOLE 500 MG PO TABS
500.0000 mg | ORAL_TABLET | Freq: Two times a day (BID) | ORAL | 0 refills | Status: AC
Start: 2020-02-29 — End: 2020-03-10

## 2020-02-29 MED ORDER — ATENOLOL 100 MG PO TABS: 50.0000 mg | ORAL_TABLET | Freq: Every day | ORAL | 3 refills | Status: DC

## 2020-02-29 MED ORDER — FLUCONAZOLE 150 MG PO TABS: 150.0000 mg | ORAL_TABLET | Freq: Every day | ORAL | 0 refills | Status: DC

## 2020-02-29 MED ORDER — GLIPIZIDE ER 10 MG PO TB24
10.0000 mg | ORAL_TABLET | Freq: Every day | ORAL | Status: DC
  Administered 2020-02-29: 10 mg via ORAL
  Filled 2020-02-29: qty 1

## 2020-02-29 MED ORDER — HYDROCODONE-ACETAMINOPHEN 5-325 MG PO TABS: 1.0000 | ORAL_TABLET | ORAL | 0 refills | Status: DC | PRN

## 2020-02-29 NOTE — Discharge Instructions (Signed)
Diet: As you were doing prior to hospitalization   Shower:  May shower but keep the wounds dry, use an occlusive plastic wrap, NO SOAKING IN TUB.  If the bandage gets wet, change with a clean dry gauze.  Dressing:  Keep dressing clean and dry until your follow up appointment on Monday   To prevent constipation: you may use a stool softener such as -  Colace (over the counter) 100 mg by mouth twice a day  Drink plenty of fluids (prune juice may be helpful) and high fiber foods Miralax (over the counter) for constipation as needed.    Itching:  If you experience itching with your medications, try taking only a single pain pill, or even half a pain pill at a time.  You may take up to 10 pain pills per day, and you can also use benadryl over the counter for itching or also to help with sleep.   Precautions:  If you experience chest pain or shortness of breath - call 911 immediately for transfer to the hospital emergency department!!  If you develop a fever greater that 101 F, purulent drainage from wound, increased redness or drainage from wound, or calf pain-Call [facility]                                              Follow- Up Appointment:  Please call for an appointment to be seen in 3 days with Cranston Neighbor PA-C at [facility]

## 2020-02-29 NOTE — Progress Notes (Signed)
AVS gone over with patient, all questions answered. Pt left with written prescriptions.

## 2020-02-29 NOTE — Progress Notes (Signed)
   Subjective: 1 Day Post-Op Procedure(s) (LRB): IRRIGATION AND DEBRIDEMENT right thumb (Right) Patient reports pain as mild.  Controlled with Norco Patient is well, and has had no acute complaints or problems   Objective: Vital signs in last 24 hours: Temp:  [96.8 F (36 C)-98.1 F (36.7 C)] 97.7 F (36.5 C) (07/23 0006) Pulse Rate:  [53-59] 53 (07/23 0006) Resp:  [10-18] 17 (07/23 0006) BP: (116-212)/(50-77) 149/62 (07/23 0006) SpO2:  [97 %-100 %] 99 % (07/23 0006) Weight:  [78.7 kg] 78.7 kg (07/22 1705)  Intake/Output from previous day: 07/22 0701 - 07/23 0700 In: 2900.3 [P.O.:240; I.V.:2260.3; IV Piggyback:400] Out: 1 [Blood:1] Intake/Output this shift: No intake/output data recorded.  Recent Labs    02/27/20 1633  HGB 12.3   Recent Labs    02/27/20 1633  WBC 8.7  RBC 4.17  HCT 36.0  PLT 232   Recent Labs    02/27/20 1633  NA 133*  K 4.2  CL 97*  CO2 25  BUN 21  CREATININE 0.98  GLUCOSE 262*  CALCIUM 9.2   No results for input(s): LABPT, INR in the last 72 hours.  EXAM General - Patient is Alert, Appropriate and Oriented Extremity - Neurovascular intact Sensation intact distally Intact pulses distally  Improved swelling right thumb with no surrounding erythema. Pulp space soft. No flexor tenoysnovitis. No drianage   Past Medical History:  Diagnosis Date  . Acute on chronic diastolic CHF (congestive heart failure) (HCC) 01/24/2015  . Anemia   . Anxiety   . Atrial fibrillation (HCC)   . Complication of anesthesia    difficult to induce sleep  . Coronary artery disease    Status post coronary stenting approximately 10 years ago  . Depression   . Dysrhythmia 2019   atrial fibrillation  . Hyperlipidemia   . Hypertension   . Peripheral artery disease (HCC)   . Pulmonary embolism (HCC) 01/08/2015   s/p cabg  . Type 2 diabetes mellitus (HCC)     Assessment/Plan:   1 Day Post-Op Procedure(s) (LRB): IRRIGATION AND DEBRIDEMENT right thumb  (Right) Active Problems:   Abscess of right thumb  Estimated body mass index is 25.62 kg/m as calculated from the following:   Height as of this encounter: 5\' 9"  (1.753 m).   Weight as of this encounter: 78.7 kg.   Discharge home today with Keflex and flagyl. Follow up with [facility] in 3 days for recheck  T. , PA-C [facility] 02/29/2020, 8:00 AM

## 2020-02-29 NOTE — Discharge Summary (Signed)
Physician Discharge Summary  Patient ID: [name] MRN: [id number] DOB/AGE: August 22, 1950 69 y.o.  Admit date: 02/27/2020 Discharge date: 02/29/2020  Admission Diagnoses:  Abscess of right thumb [L02.511]   Discharge Diagnoses: Patient Active Problem List   Diagnosis Date Noted  . Abscess of right thumb 02/27/2020  . 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 Cellulitis 08/15/2017  . Coronary artery disease of bypass graft of native heart with stable angina pectoris (HCC) 04/17/2017  . Atherosclerosis of artery of extremity with ulceration (HCC) 03/29/2017  . Atherosclerosis of native arteries of extremity with intermittent claudication (HCC) 05/31/2016  . Ischemic foot pain at rest 12/17/2015  . Acute on chronic diastolic CHF (congestive heart failure) (HCC) 01/24/2015  . 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 Unstable angina (HCC) 01/05/2015  . Traumatic ecchymosis of right thigh, post cath 01/05/2015  . Coronary stent occlusion   . Angina pectoris (HCC) 12/27/2014  . PAD (peripheral artery disease) (HCC) 04/27/2013  . Hyperlipidemia 04/27/2013  . Hypertension 04/27/2013  . Pre-op evaluation 02/24/2011  . CAD (coronary artery disease), with need for CABG, cath 01/03/15   02/24/2011  . Type 2 diabetes mellitus (HCC) 02/24/2011    Past Medical History:  Diagnosis Date  . Acute on chronic diastolic CHF (congestive heart failure) (HCC) 01/24/2015  . Anemia   . Anxiety   . Atrial fibrillation (HCC)   . Complication of anesthesia    difficult to induce sleep  . Coronary artery disease    Status post coronary stenting approximately 10 years ago  . Depression   . Dysrhythmia 2019   atrial fibrillation  . Hyperlipidemia   . Hypertension   . Peripheral artery disease (HCC)   . Pulmonary embolism (HCC) 01/08/2015   s/p cabg  . Type 2 diabetes mellitus (HCC)      Transfusion: none   Consultants (if any):   Discharged Condition: Improved  Hospital Course: [name] is an  69 y.o. female who was admitted 02/27/2020 with a diagnosis of right thumb infection and went to the operating room on 02/28/2020 and underwent the above named procedures.    Surgeries: Procedure(s): IRRIGATION AND DEBRIDEMENT right thumb on 02/28/2020 Patient tolerated the surgery well. Taken to PACU where she was stabilized and then transferred to the orthopedic floor. IV antibiotics were continued and POD 1 swelling and erythema much improved. Patient stable for discharge to home with PO ABX.    She was given perioperative antibiotics:  Anti-infectives (From admission, onward)   Start     Dose/Rate Route Frequency Ordered Stop   02/29/20 0000  fluconazole (DIFLUCAN) 150 MG tablet     Discontinue     150 mg Oral Daily 02/29/20 0805     02/29/20 0000  cephALEXin (KEFLEX) 500 MG capsule     Discontinue     500 mg Oral 4 times daily 02/29/20 0805 03/10/20 2359   02/29/20 0000  metroNIDAZOLE (FLAGYL) 500 MG tablet     Discontinue     500 mg Oral 2 times daily 02/29/20 0805 03/10/20 2359   02/28/20 2000  ceFAZolin (ANCEF) IVPB 2g/100 mL premix     Discontinue     2 g 200 mL/hr over 30 Minutes Intravenous Every 8 hours 02/28/20 1404     02/28/20 1400  metroNIDAZOLE (FLAGYL) IVPB 500 mg     Discontinue     500 mg 100 mL/hr over 60 Minutes Intravenous Every 8 hours 02/28/20 1229     02/27/20 1800  ceFAZolin (ANCEF)  IVPB 2g/100 mL premix  Status:  Discontinued        2 g 200 mL/hr over 30 Minutes Intravenous Every 6 hours 02/27/20 1738 02/28/20 1404   02/27/20 1630  Ampicillin-Sulbactam (UNASYN) 3 g in sodium chloride 0.9 % 100 mL IVPB  Status:  Discontinued        3 g 200 mL/hr over 30 Minutes Intravenous Every 6 hours 02/27/20 1624 02/27/20 1630    .    She benefited maximally from the hospital stay and there were no complications.    Recent vital signs:  Vitals:   02/28/20 2104 02/29/20 0006  BP: (!) 117/51 (!) 149/62  Pulse: 54 53  Resp:  17  Temp:  97.7 F (36.5 C)  SpO2:  99%     Recent laboratory studies:  Lab Results  Component Value Date   HGB 12.3 02/27/2020   HGB 12.0 01/02/2018   HGB 11.1 (L) 08/18/2017   Lab Results  Component Value Date   WBC 8.7 02/27/2020   PLT 232 02/27/2020   Lab Results  Component Value Date   INR 2.7 12/27/2018   Lab Results  Component Value Date   NA 133 (L) 02/27/2020   K 4.2 02/27/2020   CL 97 (L) 02/27/2020   CO2 25 02/27/2020   BUN 21 02/27/2020   CREATININE 0.98 02/27/2020   GLUCOSE 262 (H) 02/27/2020    Discharge Medications:   Allergies as of 02/29/2020      Reactions   Metformin And Related Diarrhea   Oxycodone Hcl Nausea And Vomiting   Can still take this for pain medicine   Atorvastatin Cough   Biaxin [clarithromycin] Itching   Codeine Nausea Only, Other (See Comments)   Constipates; doesn't like the way it makes her feel   Isosorbide Itching, Cough   Losartan Itching, Cough   Zetia [ezetimibe] Diarrhea      Medication List    STOP taking these medications   traMADol 50 MG tablet Commonly known as: ULTRAM     TAKE these medications   aspirin 81 MG EC tablet Take 1 tablet (81 mg total) by mouth daily.   atenolol 100 MG tablet Commonly known as: TENORMIN Take 1 tablet (100 mg) by mouth once daily   cephALEXin 500 MG capsule Commonly known as: KEFLEX Take 1 capsule (500 mg total) by mouth 4 (four) times daily for 10 days.   diclofenac Sodium 1 % Gel Commonly known as: VOLTAREN Apply topically.   Dulaglutide 1.5 MG/0.5ML Sopn Inject into the skin.   fluconazole 150 MG tablet Commonly known as: DIFLUCAN Take 1 tablet (150 mg total) by mouth daily.   furosemide 20 MG tablet Commonly known as: LASIX Take 1 tablet (20 mg total) by mouth daily as needed for fluid or edema.   glipiZIDE XL 10 MG 24 hr tablet Generic drug: glipiZIDE Take 10 mg by mouth daily with breakfast.   guaiFENesin 600 MG 12 hr tablet Commonly known as: MUCINEX Take 1 tablet (600 mg total) by mouth  daily. Takes 1 tab daily, can take addt'l tab as needed for mucus What changed:   when to take this  additional instructions   hydrALAZINE 25 MG tablet Commonly known as: APRESOLINE Take 25 mg by mouth 2 (two) times daily.   HYDROcodone-acetaminophen 5-325 MG tablet Commonly known as: NORCO/VICODIN Take 1-2 tablets by mouth every 4 (four) hours as needed for moderate pain (pain score 4-6).   metroNIDAZOLE 500 MG tablet Commonly known as: Flagyl Take  1 tablet (500 mg total) by mouth 2 (two) times daily for 10 days.   naftifine 1 % cream Commonly known as: NAFTIN Apply topically.   nitroGLYCERIN 0.4 MG SL tablet Commonly known as: NITROSTAT Place 1 tablet (0.4 mg total) under the tongue every 5 (five) minutes as needed for chest pain.   potassium chloride 10 MEQ tablet Commonly known as: KLOR-CON Take 1 tablet (10 meq) by mouth twice daily as needed when taking furosemide   QUEtiapine 50 MG tablet Commonly known as: SEROQUEL TAKE 1 AND 1/2 TABLETS BY MOUTH NIGHTLY AS DIRECTED   rosuvastatin 20 MG tablet Commonly known as: CRESTOR Take 1 tablet (20 mg total) by mouth daily.       Diagnostic Studies: No results found.  Disposition:      Follow-up Information    Evon Slack, PA-C Follow up on 03/03/2020.   Specialties: Orthopedic Surgery, Emergency Medicine Contact information: [address] [place] [address] [phone]                Signed: Patience Musca 02/29/2020, 8:08 AM

## 2020-02-29 NOTE — Telephone Encounter (Signed)
Call to patient to inquire about low HR.   Pt had surgery yesterday for hand, finger infection. During anesthesia HR was 43-56.  I had patient check carotid pulse today and HR was 42. BP in the hospital this morning was 116/71 (had been higher during hospital stay).   Spoke with DOD Dr. Debbe Odea.   Advised pt to decrease atenolol to 1/2 tablet (50 mg total) once daily. Advised she buy a BP cuff and take VS daily. Call office Monday to give update.

## 2020-02-29 NOTE — Care Management Important Message (Signed)
Important Message  Patient Details  Name: [name] MRN: [id number] Date of Birth: 1950/12/02   Medicare Important Message Given:  N/A - LOS <3 / Initial given by admissions     Olegario Messier A [name] 02/29/2020, 9:26 AM

## 2020-02-29 NOTE — TOC Progression Note (Signed)
Transition of Care [facility]) - Progression Note    Patient Details  Name: [name] MRN: [id number] Date of Birth: Jun 08, 1951  Transition of Care [facility]) CM/SW [place], RN Phone Number: 02/29/2020, 10:17 AM  Clinical Narrative:   Met with the patient to discuss [place] and needs. She lives with her husband at home and is independent , she drives She has no needs at this time, she has a PCP and is up to date and can afford her medication    Expected Discharge Plan: Home/Self Care Barriers to Discharge: Barriers Resolved  Expected Discharge Plan and Services Expected Discharge Plan: Home/Self Care   Discharge Planning Services: CM Consult   Living arrangements for the past 2 months: Single Family Home Expected Discharge Date: 02/29/20               DME Arranged: N/A         HH Arranged: NA           Social Determinants of Health (SDOH) Interventions    Readmission Risk Interventions No flowsheet data found.

## 2020-02-29 NOTE — Telephone Encounter (Signed)
STAT if HR is under 50 or over 120 (normal HR is 60-100 beats per minute)  1) What is your heart rate? 43-56  2) Do you have a log of your heart rate readings (document readings)?   3) Do you have any other symptoms? No   Patient calling - had a procedure on an injured hand yesterday - they noticed her heart rate being low and advised she call her heart doctor to discuss.  Please call.

## 2020-03-02 LAB — AEROBIC/ANAEROBIC CULTURE W GRAM STAIN (SURGICAL/DEEP WOUND): Gram Stain: NONE SEEN

## 2020-03-03 NOTE — Anesthesia Postprocedure Evaluation (Signed)
Anesthesia Post Note  Patient: [name]  Procedure(s) Performed: IRRIGATION AND DEBRIDEMENT right thumb (Right Thumb)  Patient location during evaluation: PACU Anesthesia Type: General Level of consciousness: awake and alert Pain management: pain level controlled Vital Signs Assessment: post-procedure vital signs reviewed and stable Respiratory status: spontaneous breathing, nonlabored ventilation, respiratory function stable and patient connected to nasal cannula oxygen Cardiovascular status: blood pressure returned to baseline and stable Postop Assessment: no apparent nausea or vomiting Anesthetic complications: no   No complications documented.   Last Vitals:  Vitals:   02/29/20 0006 02/29/20 0834  BP: (!) 149/62 (!) 142/57  Pulse: 53 55  Resp: 17 18  Temp: 36.5 C (!) 36.4 C  SpO2: 99% 100%    Last Pain:  Vitals:   02/29/20 1000  TempSrc:   PainSc: 0-No pain                 Yevette Edwards

## 2020-03-04 NOTE — Telephone Encounter (Signed)
Attempted to call patient. LMTCB 03/04/2020

## 2020-03-05 NOTE — Telephone Encounter (Signed)
Attempted to call patient. LMTCB 03/05/2020  For update on patient status.

## 2020-03-07 NOTE — Telephone Encounter (Signed)
Attempted to call patient. LMTCB 03/07/2020  Advised that patient should call if she has any further questions or concerns.   Closing encounter after multiple attempts.

## 2020-07-30 ENCOUNTER — Other Ambulatory Visit: Payer: Self-pay | Admitting: Internal Medicine

## 2020-07-30 DIAGNOSIS — Z0000 Encounter for general adult medical examination without abnormal findings: Secondary | ICD-10-CM

## 2020-08-05 ENCOUNTER — Telehealth: Payer: Self-pay | Admitting: Cardiovascular Disease

## 2020-08-05 NOTE — Telephone Encounter (Signed)
Patient states that her blood pressures have been elevated due to increase in stress and when she went to her primary provider they prescribed amlodipine. Reviewed that provider uses this sometimes and that she just needs to make sure she wears compression hose. She was concerned about swelling and told her to monitor and if it gets worse than her normal to then call her PCP back to report and see if he has another suggestion. She verbalized understanding of our conversation, agreement with plan, and had no further questions at this time.

## 2020-08-05 NOTE — Telephone Encounter (Signed)
Patient recently saw PCP and had high BP (180/80 ). Patient was prescribed amlodipine by PCP and patient is very hesitant to take medication after reading side effects. Patient is wondering if there is a more suitable medication for her based off her history and allergies  Please advise

## 2020-09-02 ENCOUNTER — Ambulatory Visit: Payer: Medicare Other | Admitting: Cardiovascular Disease

## 2020-09-08 ENCOUNTER — Ambulatory Visit: Payer: Medicare Other

## 2020-10-02 ENCOUNTER — Telehealth: Payer: Self-pay | Admitting: Cardiovascular Disease

## 2020-10-02 NOTE — Telephone Encounter (Signed)
CXR preformed at [facility], cannot see results in [facility] via [facility], CXR "This result has an attachment that is not available". Mrs. Malina dropped off her printed report, will place on Dr. Windell Hummingbird desk for review.

## 2020-10-02 NOTE — Telephone Encounter (Signed)
Patient came by office  Dropped off notes to be reviewed from [facility] in nurse box
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
## 2020-10-07 ENCOUNTER — Encounter: Payer: Self-pay | Admitting: Cardiovascular Disease

## 2020-10-07 ENCOUNTER — Ambulatory Visit (INDEPENDENT_AMBULATORY_CARE_PROVIDER_SITE_OTHER): Payer: Medicare Other | Admitting: Cardiovascular Disease

## 2020-10-07 ENCOUNTER — Other Ambulatory Visit: Payer: Self-pay

## 2020-10-07 VITALS — BP 160/70 | HR 61 | Ht 68.0 in | Wt 172.5 lb

## 2020-10-07 DIAGNOSIS — I739 Peripheral vascular disease, unspecified: Secondary | ICD-10-CM

## 2020-10-07 DIAGNOSIS — I25708 Atherosclerosis of coronary artery bypass graft(s), unspecified, with other forms of angina pectoris: Secondary | ICD-10-CM

## 2020-10-07 DIAGNOSIS — I70299 Other atherosclerosis of native arteries of extremities, unspecified extremity: Secondary | ICD-10-CM | POA: Diagnosis not present

## 2020-10-07 DIAGNOSIS — I480 Paroxysmal atrial fibrillation: Secondary | ICD-10-CM | POA: Diagnosis not present

## 2020-10-07 DIAGNOSIS — L97909 Non-pressure chronic ulcer of unspecified part of unspecified lower leg with unspecified severity: Secondary | ICD-10-CM | POA: Diagnosis not present

## 2020-10-07 DIAGNOSIS — E1159 Type 2 diabetes mellitus with other circulatory complications: Secondary | ICD-10-CM | POA: Diagnosis not present

## 2020-10-07 DIAGNOSIS — I2699 Other pulmonary embolism without acute cor pulmonale: Secondary | ICD-10-CM

## 2020-10-07 NOTE — Patient Instructions (Signed)
Medication Instructions:  No changes  If you need a refill on your cardiac medications before your next appointment, please call your pharmacy.    Lab work: No new labs needed   If you have labs (blood work) drawn today and your tests are completely normal, you will receive your results only by: . MyChart Message (if you have MyChart) OR . A paper copy in the mail If you have any lab test that is abnormal or we need to change your treatment, we will call you to review the results.   Testing/Procedures: No new testing needed   Follow-Up: At [facility], you and your health needs are our priority.  As part of our continuing mission to provide you with exceptional heart care, we have created designated Provider Care Teams.  These Care Teams include your primary Cardiologist (physician) and Advanced Practice Providers (APPs -  Physician Assistants and Nurse Practitioners) who all work together to provide you with the care you need, when you need it.  . You will need a follow up appointment in 6 months  . Providers on your designated Care Team:   . Christopher Berge, NP . Ryan Dunn, PA-C . Jacquelyn Visser, PA-C  Any Other Special Instructions Will Be Listed Below (If Applicable).  COVID-19 Vaccine Information can be found at: [email or web address] For questions related to vaccine distribution or appointments, please email [email or web address] or call [phone].     

## 2020-10-21 ENCOUNTER — Ambulatory Visit: Payer: Medicare Other | Admitting: Cardiovascular Disease
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
## 2020-11-03 ENCOUNTER — Ambulatory Visit (INDEPENDENT_AMBULATORY_CARE_PROVIDER_SITE_OTHER): Payer: Medicare Other | Admitting: Vascular Surgery

## 2020-11-03 ENCOUNTER — Encounter (INDEPENDENT_AMBULATORY_CARE_PROVIDER_SITE_OTHER): Payer: Self-pay | Admitting: Vascular Surgery

## 2020-11-03 ENCOUNTER — Ambulatory Visit (INDEPENDENT_AMBULATORY_CARE_PROVIDER_SITE_OTHER): Payer: Medicare Other

## 2020-11-03 ENCOUNTER — Other Ambulatory Visit: Payer: Self-pay

## 2020-11-03 VITALS — BP 194/70 | HR 66 | Resp 16 | Wt 164.3 lb

## 2020-11-03 DIAGNOSIS — E1159 Type 2 diabetes mellitus with other circulatory complications: Secondary | ICD-10-CM

## 2020-11-03 DIAGNOSIS — I25708 Atherosclerosis of coronary artery bypass graft(s), unspecified, with other forms of angina pectoris: Secondary | ICD-10-CM

## 2020-11-03 DIAGNOSIS — I70213 Atherosclerosis of native arteries of extremities with intermittent claudication, bilateral legs: Secondary | ICD-10-CM

## 2020-11-03 DIAGNOSIS — E782 Mixed hyperlipidemia: Secondary | ICD-10-CM

## 2020-11-03 DIAGNOSIS — E785 Hyperlipidemia, unspecified: Secondary | ICD-10-CM | POA: Insufficient documentation

## 2020-11-03 DIAGNOSIS — S46009A Unspecified injury of muscle(s) and tendon(s) of the rotator cuff of unspecified shoulder, initial encounter: Secondary | ICD-10-CM | POA: Insufficient documentation

## 2020-11-03 DIAGNOSIS — I10 Essential (primary) hypertension: Secondary | ICD-10-CM | POA: Diagnosis not present

## 2020-11-03 DIAGNOSIS — F419 Anxiety disorder, unspecified: Secondary | ICD-10-CM | POA: Insufficient documentation

## 2020-11-03 DIAGNOSIS — F32A Depression, unspecified: Secondary | ICD-10-CM | POA: Insufficient documentation

## 2020-11-03 DIAGNOSIS — M1990 Unspecified osteoarthritis, unspecified site: Secondary | ICD-10-CM | POA: Insufficient documentation

## 2020-11-03 DIAGNOSIS — E1169 Type 2 diabetes mellitus with other specified complication: Secondary | ICD-10-CM | POA: Insufficient documentation

## 2020-12-23 ENCOUNTER — Ambulatory Visit: Payer: Medicare Other

## 2021-04-07 LAB — COLOGUARD

## 2021-04-14 ENCOUNTER — Ambulatory Visit: Payer: Medicare Other | Admitting: Cardiovascular Disease

## 2021-04-15 LAB — COLOGUARD

## 2021-04-30 LAB — COLOGUARD: COLOGUARD: POSITIVE — AB

## 2021-05-11 ENCOUNTER — Inpatient Hospital Stay
Admission: EM | Admit: 2021-05-11 | Discharge: 2021-05-16 | DRG: 617 | Disposition: A | Discharge status: Home Health | Payer: Medicare Other | Attending: Internal Medicine | Admitting: Internal Medicine

## 2021-05-11 ENCOUNTER — Other Ambulatory Visit (INDEPENDENT_AMBULATORY_CARE_PROVIDER_SITE_OTHER): Payer: Self-pay | Admitting: Vascular Surgery

## 2021-05-11 ENCOUNTER — Other Ambulatory Visit: Payer: Self-pay

## 2021-05-11 ENCOUNTER — Inpatient Hospital Stay: Payer: Medicare Other

## 2021-05-11 ENCOUNTER — Emergency Department: Payer: Medicare Other

## 2021-05-11 DIAGNOSIS — F32A Depression, unspecified: Secondary | ICD-10-CM | POA: Diagnosis present

## 2021-05-11 DIAGNOSIS — I70249 Atherosclerosis of native arteries of left leg with ulceration of unspecified site: Secondary | ICD-10-CM | POA: Diagnosis not present

## 2021-05-11 DIAGNOSIS — B9562 Methicillin resistant Staphylococcus aureus infection as the cause of diseases classified elsewhere: Secondary | ICD-10-CM | POA: Diagnosis present

## 2021-05-11 DIAGNOSIS — Y929 Unspecified place or not applicable: Secondary | ICD-10-CM

## 2021-05-11 DIAGNOSIS — L97909 Non-pressure chronic ulcer of unspecified part of unspecified lower leg with unspecified severity: Secondary | ICD-10-CM | POA: Diagnosis not present

## 2021-05-11 DIAGNOSIS — Z885 Allergy status to narcotic agent status: Secondary | ICD-10-CM | POA: Diagnosis not present

## 2021-05-11 DIAGNOSIS — Z79899 Other long term (current) drug therapy: Secondary | ICD-10-CM

## 2021-05-11 DIAGNOSIS — E785 Hyperlipidemia, unspecified: Secondary | ICD-10-CM | POA: Diagnosis present

## 2021-05-11 DIAGNOSIS — Z951 Presence of aortocoronary bypass graft: Secondary | ICD-10-CM | POA: Diagnosis not present

## 2021-05-11 DIAGNOSIS — Z20822 Contact with and (suspected) exposure to covid-19: Secondary | ICD-10-CM | POA: Diagnosis present

## 2021-05-11 DIAGNOSIS — L0390 Cellulitis, unspecified: Secondary | ICD-10-CM

## 2021-05-11 DIAGNOSIS — Z955 Presence of coronary angioplasty implant and graft: Secondary | ICD-10-CM

## 2021-05-11 DIAGNOSIS — Z09 Encounter for follow-up examination after completed treatment for conditions other than malignant neoplasm: Secondary | ICD-10-CM

## 2021-05-11 DIAGNOSIS — L97529 Non-pressure chronic ulcer of other part of left foot with unspecified severity: Secondary | ICD-10-CM | POA: Diagnosis present

## 2021-05-11 DIAGNOSIS — E11621 Type 2 diabetes mellitus with foot ulcer: Secondary | ICD-10-CM | POA: Diagnosis present

## 2021-05-11 DIAGNOSIS — I70203 Unspecified atherosclerosis of native arteries of extremities, bilateral legs: Secondary | ICD-10-CM | POA: Diagnosis present

## 2021-05-11 DIAGNOSIS — E1142 Type 2 diabetes mellitus with diabetic polyneuropathy: Secondary | ICD-10-CM | POA: Diagnosis present

## 2021-05-11 DIAGNOSIS — W228XXA Striking against or struck by other objects, initial encounter: Secondary | ICD-10-CM | POA: Diagnosis present

## 2021-05-11 DIAGNOSIS — I209 Angina pectoris, unspecified: Secondary | ICD-10-CM

## 2021-05-11 DIAGNOSIS — F5104 Psychophysiologic insomnia: Secondary | ICD-10-CM

## 2021-05-11 DIAGNOSIS — L02511 Cutaneous abscess of right hand: Secondary | ICD-10-CM

## 2021-05-11 DIAGNOSIS — L03116 Cellulitis of left lower limb: Secondary | ICD-10-CM | POA: Diagnosis present

## 2021-05-11 DIAGNOSIS — I25708 Atherosclerosis of coronary artery bypass graft(s), unspecified, with other forms of angina pectoris: Secondary | ICD-10-CM

## 2021-05-11 DIAGNOSIS — L02612 Cutaneous abscess of left foot: Secondary | ICD-10-CM | POA: Diagnosis present

## 2021-05-11 DIAGNOSIS — E1169 Type 2 diabetes mellitus with other specified complication: Secondary | ICD-10-CM | POA: Diagnosis present

## 2021-05-11 DIAGNOSIS — I110 Hypertensive heart disease with heart failure: Secondary | ICD-10-CM | POA: Diagnosis present

## 2021-05-11 DIAGNOSIS — M1990 Unspecified osteoarthritis, unspecified site: Secondary | ICD-10-CM

## 2021-05-11 DIAGNOSIS — I5032 Chronic diastolic (congestive) heart failure: Secondary | ICD-10-CM | POA: Diagnosis present

## 2021-05-11 DIAGNOSIS — I739 Peripheral vascular disease, unspecified: Secondary | ICD-10-CM | POA: Diagnosis present

## 2021-05-11 DIAGNOSIS — S91309A Unspecified open wound, unspecified foot, initial encounter: Secondary | ICD-10-CM

## 2021-05-11 DIAGNOSIS — E669 Obesity, unspecified: Secondary | ICD-10-CM

## 2021-05-11 DIAGNOSIS — F419 Anxiety disorder, unspecified: Secondary | ICD-10-CM | POA: Diagnosis present

## 2021-05-11 DIAGNOSIS — I70269 Atherosclerosis of native arteries of extremities with gangrene, unspecified extremity: Secondary | ICD-10-CM

## 2021-05-11 DIAGNOSIS — N760 Acute vaginitis: Secondary | ICD-10-CM | POA: Diagnosis present

## 2021-05-11 DIAGNOSIS — T82897A Other specified complication of cardiac prosthetic devices, implants and grafts, initial encounter: Secondary | ICD-10-CM

## 2021-05-11 DIAGNOSIS — Z6826 Body mass index (BMI) 26.0-26.9, adult: Secondary | ICD-10-CM

## 2021-05-11 DIAGNOSIS — Z7982 Long term (current) use of aspirin: Secondary | ICD-10-CM | POA: Diagnosis not present

## 2021-05-11 DIAGNOSIS — E11628 Type 2 diabetes mellitus with other skin complications: Secondary | ICD-10-CM | POA: Diagnosis present

## 2021-05-11 DIAGNOSIS — Z8249 Family history of ischemic heart disease and other diseases of the circulatory system: Secondary | ICD-10-CM

## 2021-05-11 DIAGNOSIS — E119 Type 2 diabetes mellitus without complications: Secondary | ICD-10-CM

## 2021-05-11 DIAGNOSIS — I70219 Atherosclerosis of native arteries of extremities with intermittent claudication, unspecified extremity: Secondary | ICD-10-CM

## 2021-05-11 DIAGNOSIS — J982 Interstitial emphysema: Secondary | ICD-10-CM | POA: Diagnosis present

## 2021-05-11 DIAGNOSIS — M86172 Other acute osteomyelitis, left ankle and foot: Secondary | ICD-10-CM | POA: Diagnosis present

## 2021-05-11 DIAGNOSIS — Z888 Allergy status to other drugs, medicaments and biological substances status: Secondary | ICD-10-CM

## 2021-05-11 DIAGNOSIS — L089 Local infection of the skin and subcutaneous tissue, unspecified: Secondary | ICD-10-CM | POA: Diagnosis present

## 2021-05-11 DIAGNOSIS — E1152 Type 2 diabetes mellitus with diabetic peripheral angiopathy with gangrene: Secondary | ICD-10-CM | POA: Diagnosis present

## 2021-05-11 DIAGNOSIS — E440 Moderate protein-calorie malnutrition: Secondary | ICD-10-CM | POA: Insufficient documentation

## 2021-05-11 DIAGNOSIS — L03119 Cellulitis of unspecified part of limb: Secondary | ICD-10-CM

## 2021-05-11 DIAGNOSIS — M79605 Pain in left leg: Secondary | ICD-10-CM

## 2021-05-11 DIAGNOSIS — M79673 Pain in unspecified foot: Secondary | ICD-10-CM

## 2021-05-11 DIAGNOSIS — I200 Unstable angina: Secondary | ICD-10-CM

## 2021-05-11 DIAGNOSIS — I5033 Acute on chronic diastolic (congestive) heart failure: Secondary | ICD-10-CM

## 2021-05-11 DIAGNOSIS — R739 Hyperglycemia, unspecified: Secondary | ICD-10-CM | POA: Diagnosis present

## 2021-05-11 DIAGNOSIS — M009 Pyogenic arthritis, unspecified: Secondary | ICD-10-CM | POA: Diagnosis present

## 2021-05-11 DIAGNOSIS — Z01818 Encounter for other preprocedural examination: Secondary | ICD-10-CM

## 2021-05-11 DIAGNOSIS — D649 Anemia, unspecified: Secondary | ICD-10-CM

## 2021-05-11 DIAGNOSIS — I10 Essential (primary) hypertension: Secondary | ICD-10-CM

## 2021-05-11 DIAGNOSIS — I2510 Atherosclerotic heart disease of native coronary artery without angina pectoris: Secondary | ICD-10-CM | POA: Diagnosis present

## 2021-05-11 DIAGNOSIS — S46009A Unspecified injury of muscle(s) and tendon(s) of the rotator cuff of unspecified shoulder, initial encounter: Secondary | ICD-10-CM

## 2021-05-11 DIAGNOSIS — S7011XA Contusion of right thigh, initial encounter: Secondary | ICD-10-CM

## 2021-05-11 DIAGNOSIS — E1165 Type 2 diabetes mellitus with hyperglycemia: Secondary | ICD-10-CM

## 2021-05-11 DIAGNOSIS — I998 Other disorder of circulatory system: Secondary | ICD-10-CM

## 2021-05-11 DIAGNOSIS — M86672 Other chronic osteomyelitis, left ankle and foot: Secondary | ICD-10-CM | POA: Diagnosis not present

## 2021-05-11 DIAGNOSIS — I70299 Other atherosclerosis of native arteries of extremities, unspecified extremity: Secondary | ICD-10-CM

## 2021-05-11 DIAGNOSIS — E08621 Diabetes mellitus due to underlying condition with foot ulcer: Secondary | ICD-10-CM

## 2021-05-11 DIAGNOSIS — H2510 Age-related nuclear cataract, unspecified eye: Secondary | ICD-10-CM

## 2021-05-11 LAB — URINALYSIS, COMPLETE (UACMP) WITH MICROSCOPIC
Bilirubin Urine: NEGATIVE
Glucose, UA: 50 mg/dL — AB
Hgb urine dipstick: NEGATIVE
Ketones, ur: NEGATIVE mg/dL
Nitrite: NEGATIVE
Protein, ur: >=300 mg/dL — AB
Specific Gravity, Urine: 1.026 (ref 1.005–1.030)
pH: 5 (ref 5.0–8.0)

## 2021-05-11 LAB — COMPREHENSIVE METABOLIC PANEL
ALT: 20 U/L (ref 0–44)
AST: 19 U/L (ref 15–41)
Albumin: 3 g/dL — ABNORMAL LOW (ref 3.5–5.0)
Alkaline Phosphatase: 109 U/L (ref 38–126)
Anion gap: 10 (ref 5–15)
BUN: 25 mg/dL — ABNORMAL HIGH (ref 8–23)
CO2: 25 mmol/L (ref 22–32)
Calcium: 8.6 mg/dL — ABNORMAL LOW (ref 8.9–10.3)
Chloride: 97 mmol/L — ABNORMAL LOW (ref 98–111)
Creatinine, Ser: 0.98 mg/dL (ref 0.44–1.00)
GFR, Estimated: >60 mL/min (ref >60)
Glucose, Bld: 292 mg/dL — ABNORMAL HIGH (ref 70–99)
Potassium: 3.8 mmol/L (ref 3.5–5.1)
Sodium: 132 mmol/L — ABNORMAL LOW (ref 135–145)
Total Bilirubin: 1 mg/dL (ref 0.3–1.2)
Total Protein: 6.7 g/dL (ref 6.5–8.1)

## 2021-05-11 LAB — CBC WITH DIFFERENTIAL/PLATELET
Abs Immature Granulocytes: 0.05 10*3/uL (ref 0.00–0.07)
Basophils Absolute: 0 10*3/uL (ref 0.0–0.1)
Basophils Relative: 0 %
Eosinophils Absolute: 0 10*3/uL (ref 0.0–0.5)
Eosinophils Relative: 0 %
HCT: 31.7 % — ABNORMAL LOW (ref 36.0–46.0)
Hemoglobin: 11.4 g/dL — ABNORMAL LOW (ref 12.0–15.0)
Immature Granulocytes: 0 %
Lymphocytes Relative: 5 %
Lymphs Abs: 0.6 10*3/uL — ABNORMAL LOW (ref 0.7–4.0)
MCH: 30.2 pg (ref 26.0–34.0)
MCHC: 36 g/dL (ref 30.0–36.0)
MCV: 83.9 fL (ref 80.0–100.0)
Monocytes Absolute: 1.1 10*3/uL — ABNORMAL HIGH (ref 0.1–1.0)
Monocytes Relative: 7 %
Neutro Abs: 12.6 10*3/uL — ABNORMAL HIGH (ref 1.7–7.7)
Neutrophils Relative %: 88 %
Platelets: 249 10*3/uL (ref 150–400)
RBC: 3.78 MIL/uL — ABNORMAL LOW (ref 3.87–5.11)
RDW: 12.6 % (ref 11.5–15.5)
WBC: 14.4 10*3/uL — ABNORMAL HIGH (ref 4.0–10.5)
nRBC: 0 % (ref 0.0–0.2)

## 2021-05-11 LAB — CBG MONITORING, ED: Glucose-Capillary: 258 mg/dL — ABNORMAL HIGH (ref 70–99)

## 2021-05-11 LAB — CK: Total CK: 20 U/L — ABNORMAL LOW (ref 38–234)

## 2021-05-11 LAB — PREALBUMIN: Prealbumin: 6.5 mg/dL — ABNORMAL LOW (ref 18–38)

## 2021-05-11 LAB — HIV ANTIBODY (ROUTINE TESTING W REFLEX): HIV Screen 4th Generation wRfx: NONREACTIVE

## 2021-05-11 LAB — SEDIMENTATION RATE
Sed Rate: 69 mm/hr — ABNORMAL HIGH (ref 0–30)
Sed Rate: 64 mm/hr — ABNORMAL HIGH (ref 0–30)

## 2021-05-11 LAB — LACTIC ACID, PLASMA
Lactic Acid, Venous: 1.2 mmol/L (ref 0.5–1.9)
Lactic Acid, Venous: 1.2 mmol/L (ref 0.5–1.9)

## 2021-05-11 LAB — C-REACTIVE PROTEIN: CRP: 17.3 mg/dL — ABNORMAL HIGH (ref <1.0)

## 2021-05-11 LAB — RESP PANEL BY RT-PCR (FLU A&B, COVID) ARPGX2
Influenza A by PCR: NEGATIVE
Influenza B by PCR: NEGATIVE
SARS Coronavirus 2 by RT PCR: NEGATIVE

## 2021-05-11 MED ORDER — SODIUM CHLORIDE 0.9 % IV SOLN: INTRAVENOUS | Status: DC

## 2021-05-11 MED ORDER — METRONIDAZOLE 500 MG/100ML IV SOLN
500.0000 mg | Freq: Three times a day (TID) | INTRAVENOUS | Status: DC
Start: 2021-05-11 — End: 2021-05-14
  Administered 2021-05-12 – 2021-05-14 (×6): 500 mg via INTRAVENOUS
  Filled 2021-05-11 (×9): qty 100

## 2021-05-11 MED ORDER — PIPERACILLIN-TAZOBACTAM 3.375 G IVPB 30 MIN
3.3750 g | Freq: Once | INTRAVENOUS | Status: AC
  Administered 2021-05-11: 3.375 g via INTRAVENOUS
  Filled 2021-05-11: qty 50

## 2021-05-11 MED ORDER — QUETIAPINE FUMARATE 25 MG PO TABS
50.0000 mg | ORAL_TABLET | Freq: Every day | ORAL | Status: DC
  Administered 2021-05-11 – 2021-05-15 (×5): 50 mg via ORAL
  Filled 2021-05-11 (×5): qty 2

## 2021-05-11 MED ORDER — SODIUM CHLORIDE 0.9 % IV SOLN
2.0000 g | INTRAVENOUS | Status: DC
  Administered 2021-05-12 – 2021-05-14 (×4): 2 g via INTRAVENOUS
  Filled 2021-05-11 (×2): qty 20
  Filled 2021-05-11: qty 2
  Filled 2021-05-11 (×2): qty 20

## 2021-05-11 MED ORDER — ENOXAPARIN SODIUM 40 MG/0.4ML IJ SOSY
40.0000 mg | PREFILLED_SYRINGE | INTRAMUSCULAR | Status: DC
Start: 2021-05-11 — End: 2021-05-15
  Administered 2021-05-12 – 2021-05-14 (×2): 40 mg via SUBCUTANEOUS
  Filled 2021-05-11 (×2): qty 0.4

## 2021-05-11 MED ORDER — ONDANSETRON HCL 4 MG/2ML IJ SOLN
4.0000 mg | Freq: Four times a day (QID) | INTRAMUSCULAR | Status: DC | PRN
  Administered 2021-05-13: 4 mg via INTRAVENOUS
  Filled 2021-05-11: qty 2

## 2021-05-11 MED ORDER — NITROGLYCERIN 0.4 MG SL SUBL: 0.4000 mg | SUBLINGUAL_TABLET | SUBLINGUAL | Status: DC | PRN

## 2021-05-11 MED ORDER — ATENOLOL 25 MG PO TABS
50.0000 mg | ORAL_TABLET | Freq: Every day | ORAL | Status: DC
  Administered 2021-05-12 – 2021-05-16 (×4): 50 mg via ORAL
  Filled 2021-05-11 (×4): qty 2

## 2021-05-11 MED ORDER — ONDANSETRON HCL 4 MG PO TABS
4.0000 mg | ORAL_TABLET | Freq: Four times a day (QID) | ORAL | Status: DC | PRN
  Administered 2021-05-12: 4 mg via ORAL
  Filled 2021-05-11: qty 1

## 2021-05-11 MED ORDER — INSULIN ASPART 100 UNIT/ML IJ SOLN
0.0000 [IU] | Freq: Three times a day (TID) | INTRAMUSCULAR | Status: DC
  Administered 2021-05-11: 8 [IU] via SUBCUTANEOUS
  Administered 2021-05-12: 2 [IU] via SUBCUTANEOUS
  Administered 2021-05-12: 5 [IU] via SUBCUTANEOUS
  Administered 2021-05-12 – 2021-05-13 (×2): 3 [IU] via SUBCUTANEOUS
  Administered 2021-05-14: 5 [IU] via SUBCUTANEOUS
  Administered 2021-05-14 (×2): 8 [IU] via SUBCUTANEOUS
  Administered 2021-05-16: 5 [IU] via SUBCUTANEOUS
  Filled 2021-05-11 (×10): qty 1

## 2021-05-11 MED ORDER — ASPIRIN EC 81 MG PO TBEC
81.0000 mg | DELAYED_RELEASE_TABLET | Freq: Every day | ORAL | Status: DC
  Administered 2021-05-12 – 2021-05-16 (×4): 81 mg via ORAL
  Filled 2021-05-11 (×4): qty 1

## 2021-05-11 MED ORDER — ROSUVASTATIN CALCIUM 10 MG PO TABS
20.0000 mg | ORAL_TABLET | Freq: Every day | ORAL | Status: DC
  Administered 2021-05-11 – 2021-05-16 (×5): 20 mg via ORAL
  Filled 2021-05-11: qty 2
  Filled 2021-05-11: qty 1
  Filled 2021-05-11 (×3): qty 2
  Filled 2021-05-11: qty 1

## 2021-05-11 MED ORDER — HYDRALAZINE HCL 25 MG PO TABS
25.0000 mg | ORAL_TABLET | Freq: Two times a day (BID) | ORAL | Status: DC
  Administered 2021-05-11 – 2021-05-16 (×7): 25 mg via ORAL
  Filled 2021-05-11 (×8): qty 1

## 2021-05-11 MED ORDER — VANCOMYCIN HCL IN DEXTROSE 1-5 GM/200ML-% IV SOLN
1000.0000 mg | Freq: Once | INTRAVENOUS | Status: AC
  Administered 2021-05-11: 1000 mg via INTRAVENOUS
  Filled 2021-05-11: qty 200

## 2021-05-11 MED ORDER — TRAMADOL HCL 50 MG PO TABS
50.0000 mg | ORAL_TABLET | Freq: Three times a day (TID) | ORAL | Status: DC | PRN
  Administered 2021-05-12 (×2): 50 mg via ORAL
  Filled 2021-05-11 (×2): qty 1

## 2021-05-11 NOTE — ED Triage Notes (Signed)
Pt was sent from Dr Earney Mallet office with c/o wound infection to the left foot with redness spreading from the site and worsening pain since friday

## 2021-05-11 NOTE — ED Notes (Addendum)
Seen pt in room at this time, pt is AAOx4, respi even-unlabored. Noted dry/intact dressing to lt foot, using ortho shoe. Pt appears upset requesting bedside commode and ice water-- provided to patient at bedside .
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
## 2021-05-12 ENCOUNTER — Encounter: Payer: Self-pay | Admitting: Registered Nurse

## 2021-05-12 ENCOUNTER — Encounter
Admission: EM | Disposition: A | Discharge status: Home Health | Payer: Self-pay | Source: Home / Self Care | Attending: Internal Medicine

## 2021-05-12 ENCOUNTER — Other Ambulatory Visit (INDEPENDENT_AMBULATORY_CARE_PROVIDER_SITE_OTHER): Payer: Self-pay | Admitting: Vascular Surgery

## 2021-05-12 DIAGNOSIS — L03119 Cellulitis of unspecified part of limb: Secondary | ICD-10-CM

## 2021-05-12 DIAGNOSIS — F419 Anxiety disorder, unspecified: Secondary | ICD-10-CM

## 2021-05-12 DIAGNOSIS — L97909 Non-pressure chronic ulcer of unspecified part of unspecified lower leg with unspecified severity: Secondary | ICD-10-CM

## 2021-05-12 DIAGNOSIS — F32A Depression, unspecified: Secondary | ICD-10-CM

## 2021-05-12 DIAGNOSIS — E440 Moderate protein-calorie malnutrition: Secondary | ICD-10-CM | POA: Insufficient documentation

## 2021-05-12 LAB — CBC
HCT: 27.9 % — ABNORMAL LOW (ref 36.0–46.0)
Hemoglobin: 9.7 g/dL — ABNORMAL LOW (ref 12.0–15.0)
MCH: 29.1 pg (ref 26.0–34.0)
MCHC: 34.8 g/dL (ref 30.0–36.0)
MCV: 83.8 fL (ref 80.0–100.0)
Platelets: 200 10*3/uL (ref 150–400)
RBC: 3.33 MIL/uL — ABNORMAL LOW (ref 3.87–5.11)
RDW: 12.6 % (ref 11.5–15.5)
WBC: 9.6 10*3/uL (ref 4.0–10.5)
nRBC: 0 % (ref 0.0–0.2)

## 2021-05-12 LAB — GLUCOSE, CAPILLARY
Glucose-Capillary: 275 mg/dL — ABNORMAL HIGH (ref 70–99)
Glucose-Capillary: 188 mg/dL — ABNORMAL HIGH (ref 70–99)
Glucose-Capillary: 226 mg/dL — ABNORMAL HIGH (ref 70–99)
Glucose-Capillary: 139 mg/dL — ABNORMAL HIGH (ref 70–99)
Glucose-Capillary: 163 mg/dL — ABNORMAL HIGH (ref 70–99)

## 2021-05-12 LAB — BASIC METABOLIC PANEL
Anion gap: 6 (ref 5–15)
BUN: 29 mg/dL — ABNORMAL HIGH (ref 8–23)
CO2: 26 mmol/L (ref 22–32)
Calcium: 8.2 mg/dL — ABNORMAL LOW (ref 8.9–10.3)
Chloride: 102 mmol/L (ref 98–111)
Creatinine, Ser: 0.92 mg/dL (ref 0.44–1.00)
GFR, Estimated: >60 mL/min (ref >60)
Glucose, Bld: 245 mg/dL — ABNORMAL HIGH (ref 70–99)
Potassium: 3.4 mmol/L — ABNORMAL LOW (ref 3.5–5.1)
Sodium: 134 mmol/L — ABNORMAL LOW (ref 135–145)

## 2021-05-12 LAB — HEMOGLOBIN A1C
Hgb A1c MFr Bld: 7.9 % — ABNORMAL HIGH (ref 4.8–5.6)
Mean Plasma Glucose: 180 mg/dL

## 2021-05-12 LAB — MRSA NEXT GEN BY PCR, NASAL: MRSA by PCR Next Gen: NOT DETECTED

## 2021-05-12 SURGERY — LOWER EXTREMITY ANGIOGRAPHY
Anesthesia: Moderate Sedation | Laterality: Left

## 2021-05-12 MED ORDER — FAMOTIDINE 20 MG PO TABS: 40.0000 mg | ORAL_TABLET | Freq: Once | ORAL | Status: DC | PRN

## 2021-05-12 MED ORDER — AMLODIPINE BESYLATE 5 MG PO TABS
5.0000 mg | ORAL_TABLET | Freq: Two times a day (BID) | ORAL | Status: DC
  Administered 2021-05-12 – 2021-05-16 (×7): 5 mg via ORAL
  Filled 2021-05-12 (×7): qty 1

## 2021-05-12 MED ORDER — KETOROLAC TROMETHAMINE 15 MG/ML IJ SOLN
15.0000 mg | Freq: Four times a day (QID) | INTRAMUSCULAR | Status: DC | PRN
  Administered 2021-05-12 – 2021-05-14 (×4): 15 mg via INTRAVENOUS
  Filled 2021-05-12 (×4): qty 1

## 2021-05-12 MED ORDER — TRAMADOL HCL 50 MG PO TABS
50.0000 mg | ORAL_TABLET | Freq: Four times a day (QID) | ORAL | Status: DC | PRN
  Administered 2021-05-12 – 2021-05-16 (×5): 50 mg via ORAL
  Filled 2021-05-12 (×5): qty 1

## 2021-05-12 MED ORDER — SODIUM CHLORIDE 0.9 % IV SOLN: INTRAVENOUS | Status: DC

## 2021-05-12 MED ORDER — DIPHENHYDRAMINE HCL 50 MG/ML IJ SOLN: 50.0000 mg | Freq: Once | INTRAMUSCULAR | Status: DC | PRN

## 2021-05-12 MED ORDER — ONDANSETRON HCL 4 MG/2ML IJ SOLN: 4.0000 mg | Freq: Four times a day (QID) | INTRAMUSCULAR | Status: DC | PRN

## 2021-05-12 MED ORDER — FENTANYL CITRATE (PF) 100 MCG/2ML IJ SOLN
INTRAMUSCULAR | Status: AC
  Filled 2021-05-12: qty 2

## 2021-05-12 MED ORDER — HYDROMORPHONE HCL 1 MG/ML IJ SOLN: 1.0000 mg | Freq: Once | INTRAMUSCULAR | Status: DC | PRN

## 2021-05-12 MED ORDER — METHYLPREDNISOLONE SODIUM SUCC 125 MG IJ SOLR: 125.0000 mg | Freq: Once | INTRAMUSCULAR | Status: DC | PRN

## 2021-05-12 MED ORDER — VANCOMYCIN HCL 1500 MG/300ML IV SOLN
1500.0000 mg | INTRAVENOUS | Status: DC
  Administered 2021-05-12 – 2021-05-13 (×2): 1500 mg via INTRAVENOUS
  Filled 2021-05-12 (×3): qty 300

## 2021-05-12 MED ORDER — DEXAMETHASONE SODIUM PHOSPHATE 10 MG/ML IJ SOLN
INTRAMUSCULAR | Status: AC
  Filled 2021-05-12: qty 1

## 2021-05-12 MED ORDER — ONDANSETRON HCL 4 MG/2ML IJ SOLN
INTRAMUSCULAR | Status: AC
  Filled 2021-05-12: qty 2

## 2021-05-12 MED ORDER — ENSURE MAX PROTEIN PO LIQD
11.0000 [oz_av] | Freq: Two times a day (BID) | ORAL | Status: DC
  Administered 2021-05-12 – 2021-05-16 (×6): 11 [oz_av] via ORAL
  Filled 2021-05-12: qty 330

## 2021-05-12 MED ORDER — CHLORHEXIDINE GLUCONATE CLOTH 2 % EX PADS
6.0000 | MEDICATED_PAD | Freq: Every day | CUTANEOUS | Status: DC
  Administered 2021-05-12 – 2021-05-15 (×3): 6 via TOPICAL

## 2021-05-12 MED ORDER — MIDAZOLAM HCL 2 MG/ML PO SYRP: 8.0000 mg | ORAL_SOLUTION | Freq: Once | ORAL | Status: DC | PRN

## 2021-05-12 MED ORDER — ADULT MULTIVITAMIN W/MINERALS CH
1.0000 | ORAL_TABLET | Freq: Every day | ORAL | Status: DC
  Administered 2021-05-14 – 2021-05-16 (×2): 1 via ORAL
  Filled 2021-05-12 (×3): qty 1

## 2021-05-12 MED ORDER — JUVEN PO PACK: 1.0000 | PACK | Freq: Two times a day (BID) | ORAL | Status: DC
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
## 2021-05-12 NOTE — Progress Notes (Signed)
Pt seen. D/w pt plan for surgery.  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  D/W pt r/b/a/c and consent given.  Plan for tomorrow pm with Dr. Excell Seltzer.

## 2021-05-12 NOTE — Progress Notes (Signed)
Initial Nutrition Assessment  DOCUMENTATION CODES:  Non-severe (moderate) malnutrition in context of acute illness/injury  INTERVENTION:  Continue current diet as ordered, encourage PO intake Ensure Max po TID, each supplement provides 150 kcal and 30 grams of protein.  1 packet Juven BID, each packet provides 95 calories, 2.5 grams of protein (collagen), and 9.8 grams of carbohydrate (3 grams sugar); also contains 7 grams of L-arginine and L-glutamine, 300 mg vitamin C, 15 mg vitamin E, 1.2 mcg vitamin B-12, 9.5 mg zinc, 200 mg calcium, and 1.5 g  Calcium Beta-hydroxy-Beta-methylbutyrate to support wound healing MVI with minerals daily  NUTRITION DIAGNOSIS:  Moderate Malnutrition related to acute illness as evidenced by mild muscle depletion, mild fat depletion, moderate fat depletion.  GOAL:  Patient will meet greater than or equal to 90% of their needs  MONITOR:  PO intake, Supplement acceptance, Skin, Labs  REASON FOR ASSESSMENT:  Consult Wound healing  ASSESSMENT:  70 y.o. female with hx of DM, CAD s/p CABG x4, atrial fibrillation, HTN, HLD, and PAD who presented to ED from podiatrist office for increasing pain, swelling, and redness of the foot.  Imaging taken in ED appears to show osteomyelitis of the left fourth metatarsal phalangeal joint with associated abscess. Vascular surgery plans to take pt for for an angiogram.  Pt resting in bed at the time of visit with family at bedside. Discussed normal intake with pt. Reports she normally has 3 meals a day. Appetite and weight were stable until her recent acute illness. UBW of 160 lbs. Pt appears to eat very little protein at baseline. Reports she eats a lot of fruits and vegetables and drinks water. Discussed food sources of protein.  Discussed the importance of protein in wound healing and for preserving lean body mass. Pt agreeable to ensure max, will send between meals to encourage adequate protein intake. Pt does not like  strawberry flavors.   Nutritionally Relevant Medications: Scheduled Meds:  insulin aspart  0-15 Units Subcutaneous TID WC   rosuvastatin  20 mg Oral Daily   Continuous Infusions:  sodium chloride 75 mL/hr at 05/12/21 0137   cefTRIAXone (ROCEPHIN)  IV 2 g (05/12/21 0421)   And   metronidazole 500 mg (05/12/21 0617)   PRN Meds: ondansetron  Labs Reviewed: Na 134 K 3.4 BUN 29 SBG ranges from 188-275 mg/dL over the last 24 hours  NUTRITION - FOCUSED PHYSICAL EXAM: Flowsheet Row Most Recent Value  Orbital Region Mild depletion  Upper Arm Region No depletion  Thoracic and Lumbar Region No depletion  Buccal Region Mild depletion  Temple Region Moderate depletion  Clavicle Bone Region Mild depletion  Clavicle and Acromion Bone Region Mild depletion  Scapular Bone Region Mild depletion  Dorsal Hand Moderate depletion  Patellar Region Moderate depletion  Anterior Thigh Region Moderate depletion  Posterior Calf Region Moderate depletion  Edema (RD Assessment) None  Hair Reviewed  Eyes Reviewed  Mouth Reviewed  Skin Reviewed  Nails Reviewed   Diet Order:   Diet Order             Diet heart healthy/carb modified Room service appropriate? Yes; Fluid consistency: Thin  Diet effective now                  EDUCATION NEEDS:  No education needs have been identified at this time  Skin:  Skin Assessment: Reviewed RN Assessment  Last BM:  10/3  Height:  Ht Readings from Last 1 Encounters:  05/12/21 5\' 8"  (1.727 m)  Weight:  Wt Readings from Last 1 Encounters:  05/12/21 77.6 kg   Ideal Body Weight:  63.6 kg  BMI:  Body mass index is 26.01 kg/m.  Estimated Nutritional Needs:  Kcal:  1700-1900 kcal/d Protein:  85-100 g/d Fluid:  1.8-2 L/d   Greig Castilla, RD, LDN Clinical Dietitian Pager on Amion

## 2021-05-12 NOTE — ED Notes (Signed)
Request make for transport to the floor. 

## 2021-05-12 NOTE — Progress Notes (Addendum)
Inpatient Diabetes Program Recommendations  AACE/ADA: New Consensus Statement on Inpatient Glycemic Control   Target Ranges:  Prepandial:   less than 140 mg/dL      Peak postprandial:   less than 180 mg/dL (1-2 hours)      Critically ill patients:  140 - 180 mg/dL   Results for AJAHNAE, RATHGEBER (MRN [id number]) as of 05/12/2021 10:39  Ref. Range 05/11/2021 17:24 05/12/2021 01:14 05/12/2021 07:45  Glucose-Capillary Latest Ref Range: 70 - 99 mg/dL 295 (H) 188 (H) 416 (H)    Review of Glycemic Control  Diabetes history: DM2 Outpatient Diabetes medications: Glipizide XL 10 mg daily, Trulicity 1.5 mg Qweek (Thursday), Cinnamon 1000 mg TID with meals Current orders for Inpatient glycemic control: Novolog 0-15 units TID with meals; Solumedrol 125 mg once PRN  Inpatient Diabetes Program Recommendations:    Insulin: Please consider ordering Novolog 0-5 units QHS and ordering Novolog 3 units TID with meals for meal coverage if patient eats at least 50% of meals.  HbgA1C:  Current A1C in process.   Addendum 05/12/21@13 :05-Spoke with patient regarding DM control. Patient states she is taking Glipizide XL 10 mg daily, Trulicity 1.5 mg Qweek (Thursday), and Cinnamon 1000 mg TID with meals for DM control. Patient states that she checks glucose fasting, before meals, and 2 hours post prandial; she reports glucose ranges in low 100's (less than 120) fasting, 100-120 prior to meals, and 110-120 mg/dl 2 hours after meals. Discussed using insulin while inpatient for glucose control. Stressed importance of maintaining good glycemic control with foot infection to promote wound healing. Patient states she has everything she needs at home for DM management. Patient verbalized understanding of information discussed and she has no questions at this time related to DM.  Thanks, Orlando Penner, RN, MSN, CDE Diabetes Coordinator Inpatient Diabetes Program [phone] (Team Pager from 8am to 5pm)

## 2021-05-12 NOTE — Progress Notes (Signed)
Pt arrived via stretcher from ED in stable condition. Pt A/ox4, VSS. Skin assessment complete. Pt oriented to room and instructed on how to call for assistance.

## 2021-05-12 NOTE — Progress Notes (Signed)
PROGRESS NOTE    [name]  OZH:[id number] DOB: May 24, 1951 DOA: 05/11/2021 PCP: Marguarite Arbour, MD    Brief Narrative:  70 y/o female admitted with left diabetic foot infection. Started on IV antibiotics. Podiatry following and plans on amputation of left 4th toe on 10/5. Also seen by vascular and plans for arteriogram on 10/5 or 10/6.   Assessment & Plan:   Principal Problem:   Diabetic foot infection (HCC) Active Problems:   CAD (coronary artery disease), with need for CABG, cath 01/03/15     PAD (peripheral artery disease) (HCC)   Diabetes mellitus with hyperglycemia (HCC)   Anxiety and depression   Malnutrition of moderate degree   Diabetic foot infection -MRI confirm osteomyelitis of 4th digit on left foot -podiatry following and plans are for amputation on 10/5 -cultures have been sent -currently on ceftriaxone, flagyl and vancomycin  Peripheral arterial diease -vascular surgery following -plan for arteriogram and possible revascularization of left foot on 10/5 or 10/6  Diabetes type 2 -glipizide on hold -continue on SSI -blood sugars have been stable  Moderate malnutrition -nutrition following    DVT prophylaxis: enoxaparin (LOVENOX) injection 40 mg Start: 05/11/21 1500  Code Status: full code Family Communication: discussed with patient Disposition Plan: Status is: Inpatient  Remains inpatient appropriate because:Hemodynamically unstable, IV treatments appropriate due to intensity of illness or inability to take PO, and Inpatient level of care appropriate due to severity of illness  Dispo: The patient is from: Home              Anticipated d/c is to: Home              Patient currently is not medically stable to d/c.   Difficult to place patient No         Consultants:  Podiatry Vascular surgery  Procedures:      Antimicrobials:  Ceftriaxone Flagyl vancomycin    Subjective: Continues to have pain in her left  foot  Objective: Vitals:   05/12/21 0742 05/12/21 1126 05/12/21 1127 05/12/21 1600  BP: (!) 157/51 (!) 155/58 (!) 155/58 129/71  Pulse: (!) 59 62 62 (!) 55  Resp: 20 14 14 15   Temp: 98.6 F (37 C) 98 F (36.7 C) 98 F (36.7 C) 98.2 F (36.8 C)  TempSrc: Oral     SpO2: 93% 95% 95% 94%  Weight:      Height:        Intake/Output Summary (Last 24 hours) at 05/12/2021 2005 Last data filed at 05/12/2021 1706 Gross per 24 hour  Intake 882.97 ml  Output --  Net 882.97 ml   Filed Weights   05/12/21 0109  Weight: 77.6 kg    Examination:  General exam: Appears calm and comfortable  Respiratory system: Clear to auscultation. Respiratory effort normal. Cardiovascular system: S1 & S2 heard, RRR. No JVD, murmurs, rubs, gallops or clicks. No pedal edema. Gastrointestinal system: Abdomen is nondistended, soft and nontender. No organomegaly or masses felt. Normal bowel sounds heard. Central nervous system: Alert and oriented. No focal neurological deficits. Extremities: dressing over left foot not removed today  Skin: No rashes, lesions or ulcers Psychiatry: Judgement and insight appear normal. Mood & affect appropriate.     Data Reviewed: I have personally reviewed following labs and imaging studies  CBC: Recent Labs  Lab 05/11/21 1328 05/12/21 0400  WBC 14.4* 9.6  NEUTROABS 12.6*  --   HGB 11.4* 9.7*  HCT 31.7* 27.9*  MCV 83.9 83.8  PLT  249 200   Basic Metabolic Panel: Recent Labs  Lab 05/11/21 1328 05/12/21 0400  NA 132* 134*  K 3.8 3.4*  CL 97* 102  CO2 25 26  GLUCOSE 292* 245*  BUN 25* 29*  CREATININE 0.98 0.92  CALCIUM 8.6* 8.2*   GFR: Estimated Creatinine Clearance: 63.2 mL/min (by C-G formula based on SCr of 0.92 mg/dL). Liver Function Tests: Recent Labs  Lab 05/11/21 1328  AST 19  ALT 20  ALKPHOS 109  BILITOT 1.0  PROT 6.7  ALBUMIN 3.0*   No results for input(s): LIPASE, AMYLASE in the last 168 hours. No results for input(s): AMMONIA in the  last 168 hours. Coagulation Profile: No results for input(s): INR, PROTIME in the last 168 hours. Cardiac Enzymes: Recent Labs  Lab 05/11/21 1328  CKTOTAL 20*   BNP (last 3 results) No results for input(s): PROBNP in the last 8760 hours. HbA1C: Recent Labs    05/11/21 1559  HGBA1C 7.9*   CBG: Recent Labs  Lab 05/11/21 1724 05/12/21 0114 05/12/21 0745 05/12/21 1128 05/12/21 1647  GLUCAP 258* 275* 188* 226* 139*   Lipid Profile: No results for input(s): CHOL, HDL, LDLCALC, TRIG, CHOLHDL, LDLDIRECT in the last 72 hours. Thyroid Function Tests: No results for input(s): TSH, T4TOTAL, FREET4, T3FREE, THYROIDAB in the last 72 hours. Anemia Panel: No results for input(s): VITAMINB12, FOLATE, FERRITIN, TIBC, IRON, RETICCTPCT in the last 72 hours. Sepsis Labs: Recent Labs  Lab 05/11/21 1328 05/11/21 1559  LATICACIDVEN 1.2 1.2    Recent Results (from the past 240 hour(s))  Aerobic/Anaerobic Culture w Gram Stain (surgical/deep wound)     Status: None (Preliminary result)   Collection Time: 05/11/21  5:59 PM   Specimen: Wound  Result Value Ref Range Status   Specimen Description   Final    WOUND Performed at [facility], [address] [place], [place] [address]    Special Requests   Final    LEFT FOOT Performed at [facility], [address] [place], [place] [address]    Gram Stain   Final    NO ORGANISMS SEEN SQUAMOUS EPITHELIAL CELLS PRESENT FEW WBC PRESENT, PREDOMINANTLY MONONUCLEAR ABUNDANT GRAM POSITIVE COCCI IN SINGLES IN CLUSTERS RARE GRAM POSITIVE RODS    Culture   Final    ABUNDANT STAPHYLOCOCCUS AUREUS CULTURE REINCUBATED FOR BETTER GROWTH SUSCEPTIBILITIES TO FOLLOW Performed at [facility], [address], [place], [place] [address]    Report Status PENDING  Incomplete  Resp Panel by RT-PCR (Flu A&B, Covid) Nasopharyngeal Swab     Status: None   Collection Time: 05/11/21  8:31 PM   Specimen: Nasopharyngeal Swab;  Nasopharyngeal(NP) swabs in vial transport medium  Result Value Ref Range Status   SARS Coronavirus 2 by RT PCR NEGATIVE NEGATIVE Final    Comment: (NOTE) SARS-CoV-2 target nucleic acids are NOT DETECTED.  The SARS-CoV-2 RNA is generally detectable in upper respiratory specimens during the acute phase of infection. The lowest concentration of SARS-CoV-2 viral copies this assay can detect is 138 copies/mL. A negative result does not preclude SARS-Cov-2 infection and should not be used as the sole basis for treatment or other patient management decisions. A negative result may occur with  improper specimen collection/handling, submission of specimen other than nasopharyngeal swab, presence of viral mutation(s) within the areas targeted by this assay, and inadequate number of viral copies(<138 copies/mL). A negative result must be combined with clinical observations, patient history, and epidemiological information. The expected result is Negative.  Fact Sheet for Patients:  BloggerCourse.com  Fact Sheet for Healthcare Providers:  SeriousBroker.it  This test is no t yet approved or cleared by the [place] FDA and  has been authorized for detection and/or diagnosis of SARS-CoV-2 by FDA under an Emergency Use Authorization (EUA). This EUA will remain  in effect (meaning this test can be used) for the duration of the COVID-19 declaration under Section 564(b)(1) of the Act, 21 U.S.C.section 360bbb-3(b)(1), unless the authorization is terminated  or revoked sooner.       Influenza A by PCR NEGATIVE NEGATIVE Final   Influenza B by PCR NEGATIVE NEGATIVE Final    Comment: (NOTE) The Xpert Xpress SARS-CoV-2/FLU/RSV plus assay is intended as an aid in the diagnosis of influenza from Nasopharyngeal swab specimens and should not be used as a sole basis for treatment. Nasal washings and aspirates are unacceptable for Xpert Xpress  SARS-CoV-2/FLU/RSV testing.  Fact Sheet for Patients: BloggerCourse.com  Fact Sheet for Healthcare Providers: SeriousBroker.it  This test is not yet approved or cleared by the [place] FDA and has been authorized for detection and/or diagnosis of SARS-CoV-2 by FDA under an Emergency Use Authorization (EUA). This EUA will remain in effect (meaning this test can be used) for the duration of the COVID-19 declaration under Section 564(b)(1) of the Act, 21 U.S.C. section 360bbb-3(b)(1), unless the authorization is terminated or revoked.  Performed at [facility], [address] [place], [place] [address]   MRSA Next Gen by PCR, Nasal     Status: None   Collection Time: 05/12/21 10:55 AM   Specimen: Nasal Mucosa; Nasal Swab  Result Value Ref Range Status   MRSA by PCR Next Gen NOT DETECTED NOT DETECTED Final    Comment: (NOTE) The GeneXpert MRSA Assay (FDA approved for NASAL specimens only), is one component of a comprehensive MRSA colonization surveillance program. It is not intended to diagnose MRSA infection nor to guide or monitor treatment for MRSA infections. Test performance is not FDA approved in patients less than 71 years old. Performed at [facility], [address] [place], [place] [address]          Radiology Studies: MR FOOT LEFT WO CONTRAST  Result Date: 05/11/2021 CLINICAL DATA:  Limping, acute, foot pain, infection suspected. Evaluate for necrotizing fasciitis and osteomyelitis EXAM: MRI OF THE LEFT FOOT WITHOUT CONTRAST TECHNIQUE: Multiplanar, multisequence MR imaging of the left forefoot was performed. No intravenous contrast was administered. COMPARISON:  X-ray 05/11/2021, 08/24/2013 FINDINGS: Bones/Joint/Cartilage Bone marrow edema with confluent low T1 marrow signal changes within the fourth toe proximal phalanx as well as within the fourth metatarsal head. Findings are compatible  with acute osteomyelitis. Large complex fourth MTP joint effusion, most compatible with septic arthritis. Bone marrow edema within the proximal phalanx of the left third toe with preservation of the T1 marrow signal suggesting reactive osteitis. Postsurgical changes of prior partial fifth ray resection without evidence of osteomyelitis of the residual fifth toe or fifth metatarsal. Bone marrow edema with linear low T1/T2 signal changes at the central navicular bone compatible with a subchondral fracture (series 10, image 16). No additional fractures. There is degenerative changes within the forefoot and midfoot, most pronounced at the second tarsometatarsal joint. Ligaments Intact Lisfranc ligament. Muscles and Tendons Chronic denervation changes of the intrinsic foot musculature. Flexor tenosynovitis of the fourth toe with fluid tracking into the proximal forefoot. Soft tissues Deep ulceration underlying the fourth metatarsal head. There is a contiguous fluid collection which involves  the fourth MTP joint space measuring 2.2 x 1.9 x 2.0 cm. Small lobulated fluid collection tracks within the superficial soft tissues underlying the plantar fascia at the mid forefoot measuring approximately 2.7 x 0.5 x 1.1 cm. This appears contiguous with the overlying tenosynovial fluid collection related to the fourth toe flexor tendon (series 6, images 29-30). IMPRESSION: 1. Deep ulceration underlying the fourth metatarsal head with contiguous fluid collection extending to the fourth MTP joint space measuring up to 2.2 cm. Findings are compatible with septic arthritis of the fourth MTP joint. Acute osteomyelitis of the fourth toe proximal phalanx and fourth metatarsal head. 2. Small lobulated fluid collection tracks within the superficial soft tissues underlying the plantar fascia at the level of the mid forefoot measuring 2.7 x 0.5 x 1.1 cm. This appears contiguous with the overlying tenosynovial fluid collection related to the  fourth toe flexor tendon. 3. Findings of reactive osteitis of the third toe proximal phalanx. 4. Subchondral fracture of the navicular bone, likely subacute. 5. Postsurgical changes of prior partial fifth ray resection without evidence of osteomyelitis of the residual fifth toe or fifth metatarsal. Electronically Signed   By: Duanne Guess D.O.   On: 05/11/2021 16:15   DG Foot Complete Left  Result Date: 05/11/2021 CLINICAL DATA:  Wound infection to the left foot with redness spreading from the site and worsening pain x3 days. EXAM: LEFT FOOT - COMPLETE 3+ VIEW COMPARISON:  Left foot radiograph 05/16/2017 FINDINGS: Stable postoperative changes of fifth metatarsal amputation at the level of the mid shaft. The fifth toe proximal phalanx is not present, unchanged compared to 01/12/2013. Small amount of subcutaneous emphysema is noted within the plantar soft tissues of the distal lateral forefoot, likely corresponding to reported wound. No underlying cortical abnormality. Mild osteoarthritic changes of the midfoot. Small posterior and plantar calcaneal enthesophytes. Os peroneum. IMPRESSION: Small amount of subcutaneous emphysema within the plantar soft tissues of the distal lateral forefoot, likely related to reported wound. No underlying osseous abnormality to suggest osteomyelitis. However, if there is clinical concern for osteomyelitis, MRI of the left foot can be obtained. Electronically Signed   By: Sherron Ales M.D.   On: 05/11/2021 14:49        Scheduled Meds:  amLODipine  5 mg Oral BID   aspirin EC  81 mg Oral Daily   atenolol  50 mg Oral Daily   Chlorhexidine Gluconate Cloth  6 each Topical Daily   enoxaparin (LOVENOX) injection  40 mg Subcutaneous Q24H   hydrALAZINE  25 mg Oral BID   insulin aspart  0-15 Units Subcutaneous TID WC   [START ON 05/13/2021] multivitamin with minerals  1 tablet Oral Daily   nutrition supplement (JUVEN)  1 packet Oral BID BM   Ensure Max Protein  11 oz Oral  BID   QUEtiapine  50 mg Oral QHS   rosuvastatin  20 mg Oral Daily   Continuous Infusions:  sodium chloride 75 mL/hr at 05/12/21 0137   cefTRIAXone (ROCEPHIN)  IV 2 g (05/12/21 0421)   And   metronidazole 500 mg (05/12/21 1515)   vancomycin 1,500 mg (05/12/21 1307)     LOS: 1 day    Time spent: 25 mins    Erick Blinks, MD [facility]   If 7PM-7AM, please contact night-coverage [email or web address]  05/12/2021, 8:05 PM

## 2021-05-12 NOTE — TOC Initial Note (Signed)
Transition of Care [facility]) - Initial/Assessment Note    Patient Details  Name: [name] MRN: [id number] Date of Birth: Jan 26, 1951  [facility] Care [facility]) CM/SW Contact:    Caryn Section, RN Phone Number: 05/12/2021, 9:50 AM  Clinical Narrative:   Paitent lives at home with spouse, who can assist if needed.  Does not have current home health, but will accept if recommended.  Patient is expected to have surgery Wednesday, and TOC will discuss recommondations post surgery.  TOC contact informaiton given, TOC to follow                Expected Discharge Plan:  (TBD)     Patient Goals and CMS Choice Patient states their goals for this hospitalization and ongoing recovery are:: To have surgery, get well and go home   Choice offered to / list presented to : NA  Expected Discharge Plan and Services Expected Discharge Plan:  (TBD)   Discharge Planning Services: CM Consult Post Acute Care Choice: Durable Medical Equipment (TBD)                             HH Arranged:  (TBD)          Prior Living Arrangements/Services   Lives with:: Self, Spouse Patient language and need for interpreter reviewed:: Yes (No interpreteer required) Do you feel safe going back to the place where you live?: Yes      Need for Family Participation in Patient Care: Yes (Comment) Care giver support system in place?: Yes (comment) Current home services:  (no current home services) Criminal Activity/Legal Involvement Pertinent to Current Situation/Hospitalization: No - Comment as needed  Activities of Daily Living      Permission Sought/Granted   Permission granted to share information with : Yes, Verbal Permission Granted     Permission granted to share info w AGENCY: prospective home health and DME        Emotional Assessment Appearance:: Appears stated age Attitude/Demeanor/Rapport: Engaged Affect (typically observed): Pleasant, Appropriate Orientation: : Oriented to Self,  Oriented to Place, Oriented to  Time, Oriented to Situation Alcohol / Substance Use: Not Applicable Psych Involvement: No (comment)  Admission diagnosis:  Hyperglycemia [R73.9] Wound infection [T14.8XXA, L08.9] Diabetic foot infection (HCC) [P80.998, L08.9] Cellulitis of lower extremity, unspecified laterality [L03.119] Diabetic ulcer of other part of foot associated with diabetes mellitus due to underlying condition, with necrosis of muscle, unspecified laterality (HCC) [P38.250, L97.503] Patient Active Problem List   Diagnosis Date Noted   Diabetic foot infection (HCC) 05/11/2021   Anxiety and depression 11/03/2020   Hyperlipidemia associated with type 2 diabetes mellitus (HCC) 11/03/2020   Osteoarthritis 11/03/2020   Rotator cuff injury 11/03/2020   Abscess of right thumb 02/27/2020   Diabetes mellitus with hyperglycemia (HCC) 12/20/2017   Cellulitis 08/15/2017   Coronary artery disease of bypass graft of native heart with stable angina pectoris (HCC) 04/17/2017   Atherosclerosis of artery of extremity with ulceration (HCC) 03/29/2017   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 mellitus, limited to breakdown of skin (HCC) 03/16/2017   Atherosclerosis of native arteries of extremity with intermittent claudication (HCC) 05/31/2016   Ischemic foot pain at rest 12/17/2015   Acute on chronic diastolic CHF (congestive heart failure) (HCC) 01/24/2015   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   Unstable angina (HCC) 01/05/2015   Traumatic ecchymosis of right thigh, post cath 01/05/2015   Coronary stent occlusion  Angina pectoris (HCC) 12/27/2014   Chronic insomnia 10/01/2014   Senile nuclear sclerosis 09/03/2014   Anemia 12/20/2013   Obesity 12/20/2013   PAD (peripheral artery disease) (HCC) 04/27/2013   Hyperlipidemia 04/27/2013   Hypertension 04/27/2013   Open wound of foot 02/23/2013   Pre-op evaluation 02/24/2011   CAD (coronary artery disease), with need for CABG, cath  01/03/15   02/24/2011   Type 2 diabetes mellitus (HCC) 02/24/2011   PCP:  Marguarite Arbour, MD Pharmacy:   [facility] [id number] [place], [place] - [address] [place] [address] Phone: [phone] Fax: [phone]     Social Determinants of Health (SDOH) Interventions    Readmission Risk Interventions No flowsheet data found.

## 2021-05-12 NOTE — ED Notes (Signed)
Pt upset and claims this RN advised her not to have any apple juice or crackers at bedside, informed this Pt she probably confused me with someone else as no information was relayed to pt from this RN previously. Pt  advised will check with her file for diet/NPO orders. Pt made aware per orders NPO post midnight 10/04, and 10/5@0830H .

## 2021-05-12 NOTE — Consult Note (Signed)
Pharmacy Antibiotic Note  [name] is a 70 y.o. female admitted on 05/11/2021 with  osteomyelitis .    Pt received 1g IV vancomcyin x 1 in ED 10/3.  Pharmacy has been consulted for Vancomcyin dosing.  Plan: Will start: Vancomycin 1500 mg IV Q 24 hrs. Goal AUC 400-550. Expected AUC: 509 SCr used: 0.92  Will continue Ceftriaxone 2g q24h and metronidazole 500mg  q12h IV as ordered   Height: 5\' 8"  (172.7 cm) Weight: 77.6 kg (171 lb 1.2 oz) IBW/kg (Calculated) : 63.9  Temp (24hrs), Avg:98.5 F (36.9 C), Min:98 F (36.7 C), Max:99.2 F (37.3 C)  Recent Labs  Lab 05/11/21 1328 05/11/21 1559 05/12/21 0400  WBC 14.4*  --  9.6  CREATININE 0.98  --  0.92  LATICACIDVEN 1.2 1.2  --     Estimated Creatinine Clearance: 63.2 mL/min (by C-G formula based on SCr of 0.92 mg/dL).    Allergies  Allergen Reactions   Metformin And Related Diarrhea   Oxycodone Hcl Nausea And Vomiting    Can still take this for pain medicine   Other Cough   Atorvastatin Cough   Biaxin [Clarithromycin] Itching   Codeine Nausea Only and Other (See Comments)    Constipates; doesn't like the way it makes her feel    Isosorbide Itching and Cough   Losartan Itching and Cough   Zetia [Ezetimibe] Diarrhea    Antimicrobials this admission: Zosyn 10/4 x 1  Vancomcyin 10/4 >> Ceftriaxone 10/5 > Metronidazole 10/5 >  Dose adjustments this admission: None  Microbiology results: 10/3 L foot wound/surgical culture: Abundant GPC in singles/clusters, rare GPR's   MRSA PCR: pending  Thank you for allowing pharmacy to be a part of this patient's care.  07/11/21, PharmD, BCPS Clinical Pharmacist 05/12/2021 10:52 AM

## 2021-05-13 ENCOUNTER — Encounter
Admission: EM | Disposition: A | Discharge status: Home Health | Payer: Self-pay | Source: Home / Self Care | Attending: Internal Medicine

## 2021-05-13 ENCOUNTER — Inpatient Hospital Stay: Payer: Medicare Other

## 2021-05-13 ENCOUNTER — Inpatient Hospital Stay: Payer: Medicare Other | Admitting: Anesthesiology

## 2021-05-13 ENCOUNTER — Encounter: Payer: Self-pay | Admitting: Internal Medicine

## 2021-05-13 HISTORY — PX: INCISION AND DRAINAGE: SHX5863

## 2021-05-13 HISTORY — PX: AMPUTATION: SHX166

## 2021-05-13 LAB — CBC
HCT: 28.3 % — ABNORMAL LOW (ref 36.0–46.0)
Hemoglobin: 9.7 g/dL — ABNORMAL LOW (ref 12.0–15.0)
MCH: 29.7 pg (ref 26.0–34.0)
MCHC: 34.3 g/dL (ref 30.0–36.0)
MCV: 86.5 fL (ref 80.0–100.0)
Platelets: 217 10*3/uL (ref 150–400)
RBC: 3.27 MIL/uL — ABNORMAL LOW (ref 3.87–5.11)
RDW: 13 % (ref 11.5–15.5)
WBC: 8.5 10*3/uL (ref 4.0–10.5)
nRBC: 0 % (ref 0.0–0.2)

## 2021-05-13 LAB — BASIC METABOLIC PANEL
Anion gap: 8 (ref 5–15)
BUN: 30 mg/dL — ABNORMAL HIGH (ref 8–23)
CO2: 26 mmol/L (ref 22–32)
Calcium: 8.4 mg/dL — ABNORMAL LOW (ref 8.9–10.3)
Chloride: 102 mmol/L (ref 98–111)
Creatinine, Ser: 0.89 mg/dL (ref 0.44–1.00)
GFR, Estimated: >60 mL/min (ref >60)
Glucose, Bld: 198 mg/dL — ABNORMAL HIGH (ref 70–99)
Potassium: 3.9 mmol/L (ref 3.5–5.1)
Sodium: 136 mmol/L (ref 135–145)

## 2021-05-13 LAB — GLUCOSE, CAPILLARY
Glucose-Capillary: 105 mg/dL — ABNORMAL HIGH (ref 70–99)
Glucose-Capillary: 113 mg/dL — ABNORMAL HIGH (ref 70–99)
Glucose-Capillary: 141 mg/dL — ABNORMAL HIGH (ref 70–99)
Glucose-Capillary: 199 mg/dL — ABNORMAL HIGH (ref 70–99)
Glucose-Capillary: 249 mg/dL — ABNORMAL HIGH (ref 70–99)

## 2021-05-13 SURGERY — AMPUTATION, FOOT, RAY
Anesthesia: General | Laterality: Left

## 2021-05-13 MED ORDER — FENTANYL CITRATE (PF) 100 MCG/2ML IJ SOLN: 25.0000 ug | INTRAMUSCULAR | Status: DC | PRN

## 2021-05-13 MED ORDER — MIDAZOLAM HCL 2 MG/2ML IJ SOLN
INTRAMUSCULAR | Status: AC
  Filled 2021-05-13: qty 2

## 2021-05-13 MED ORDER — FENTANYL CITRATE (PF) 100 MCG/2ML IJ SOLN
INTRAMUSCULAR | Status: DC | PRN
  Administered 2021-05-13: 50 ug via INTRAVENOUS
  Administered 2021-05-13 (×2): 25 ug via INTRAVENOUS

## 2021-05-13 MED ORDER — LIDOCAINE HCL (PF) 1 % IJ SOLN
INTRAMUSCULAR | Status: DC | PRN
  Administered 2021-05-13: 20 mL

## 2021-05-13 MED ORDER — FENTANYL CITRATE (PF) 100 MCG/2ML IJ SOLN
INTRAMUSCULAR | Status: AC
  Filled 2021-05-13: qty 2

## 2021-05-13 MED ORDER — BUPIVACAINE HCL (PF) 0.5 % IJ SOLN
INTRAMUSCULAR | Status: DC | PRN
  Administered 2021-05-13: 10 mL

## 2021-05-13 MED ORDER — POVIDONE-IODINE 10 % EX SOLN
CUTANEOUS | Status: DC | PRN
  Administered 2021-05-13: 1 via TOPICAL

## 2021-05-13 MED ORDER — VANCOMYCIN HCL 1000 MG IV SOLR
INTRAVENOUS | Status: DC | PRN
  Administered 2021-05-13: 1000 mg

## 2021-05-13 MED ORDER — ONDANSETRON HCL 4 MG/2ML IJ SOLN
INTRAMUSCULAR | Status: DC | PRN
  Administered 2021-05-13: 4 mg via INTRAVENOUS

## 2021-05-13 MED ORDER — LIDOCAINE HCL (PF) 1 % IJ SOLN
INTRAMUSCULAR | Status: AC
  Filled 2021-05-13: qty 30

## 2021-05-13 MED ORDER — SODIUM CHLORIDE 0.9 % IV SOLN: INTRAVENOUS | Status: DC | PRN

## 2021-05-13 MED ORDER — PROPOFOL 10 MG/ML IV BOLUS
INTRAVENOUS | Status: DC | PRN
  Administered 2021-05-13: 120 mg via INTRAVENOUS

## 2021-05-13 MED ORDER — DEXAMETHASONE SODIUM PHOSPHATE 10 MG/ML IJ SOLN
INTRAMUSCULAR | Status: DC | PRN
  Administered 2021-05-13: 5 mg via INTRAVENOUS

## 2021-05-13 MED ORDER — GLYCOPYRROLATE 0.2 MG/ML IJ SOLN
INTRAMUSCULAR | Status: DC | PRN
  Administered 2021-05-13: .2 mg via INTRAVENOUS

## 2021-05-13 MED ORDER — MIDAZOLAM HCL 2 MG/2ML IJ SOLN
INTRAMUSCULAR | Status: DC | PRN
  Administered 2021-05-13: 2 mg via INTRAVENOUS

## 2021-05-13 MED ORDER — DEXMEDETOMIDINE (PRECEDEX) IN NS 20 MCG/5ML (4 MCG/ML) IV SYRINGE
PREFILLED_SYRINGE | INTRAVENOUS | Status: DC | PRN
  Administered 2021-05-13: 8 ug via INTRAVENOUS

## 2021-05-13 MED ORDER — LIDOCAINE HCL (CARDIAC) PF 100 MG/5ML IV SOSY
PREFILLED_SYRINGE | INTRAVENOUS | Status: DC | PRN
  Administered 2021-05-13: 100 mg via INTRAVENOUS

## 2021-05-13 MED ORDER — 0.9 % SODIUM CHLORIDE (POUR BTL) OPTIME
TOPICAL | Status: DC | PRN
  Administered 2021-05-13: 500 mL

## 2021-05-13 MED ORDER — BUPIVACAINE HCL (PF) 0.5 % IJ SOLN
INTRAMUSCULAR | Status: AC
  Filled 2021-05-13: qty 30

## 2021-05-13 MED ORDER — ALPRAZOLAM 0.25 MG PO TABS
0.2500 mg | ORAL_TABLET | Freq: Two times a day (BID) | ORAL | Status: DC | PRN
  Administered 2021-05-15: 0.25 mg via ORAL
  Filled 2021-05-13: qty 1

## 2021-05-13 MED ORDER — NEOMYCIN-POLYMYXIN B GU 40-200000 IR SOLN
Status: DC | PRN
  Administered 2021-05-13: 2 mL

## 2021-05-13 MED ORDER — VANCOMYCIN HCL 1000 MG IV SOLR
INTRAVENOUS | Status: AC
  Filled 2021-05-13: qty 20

## 2021-05-13 MED ORDER — CHLORHEXIDINE GLUCONATE 4 % EX LIQD: 60.0000 mL | Freq: Once | CUTANEOUS | Status: DC

## 2021-05-13 MED ORDER — NEOMYCIN-POLYMYXIN B GU 40-200000 IR SOLN
Status: AC
  Filled 2021-05-13: qty 2

## 2021-05-13 SURGICAL SUPPLY — 70 items
BAG COUNTER SPONGE SURGICOUNT (BAG) ×2 IMPLANT
BAG SPNG CNTER NS LX DISP (BAG) ×1
BLADE MED AGGRESSIVE (BLADE) ×2 IMPLANT
BLADE OSC/SAGITTAL MD 5.5X18 (BLADE) IMPLANT
BLADE OSCILLATING/SAGITTAL (BLADE)
BLADE SURG 15 STRL LF DISP TIS (BLADE) IMPLANT
BLADE SURG 15 STRL SS (BLADE)
BLADE SURG MINI STRL (BLADE) IMPLANT
BLADE SW THK.38XMED LNG THN (BLADE) IMPLANT
BNDG CMPR STD VLCR NS LF 5.8X3 (GAUZE/BANDAGES/DRESSINGS)
BNDG CMPR STD VLCR NS LF 5.8X4 (GAUZE/BANDAGES/DRESSINGS)
BNDG CONFORM 2 STRL LF (GAUZE/BANDAGES/DRESSINGS) IMPLANT
BNDG CONFORM 3 STRL LF (GAUZE/BANDAGES/DRESSINGS) IMPLANT
BNDG ELASTIC 3X5.8 VLCR NS LF (GAUZE/BANDAGES/DRESSINGS) IMPLANT
BNDG ELASTIC 4X5.8 VLCR NS LF (GAUZE/BANDAGES/DRESSINGS) IMPLANT
BNDG ELASTIC 4X5.8 VLCR STR LF (GAUZE/BANDAGES/DRESSINGS) ×2 IMPLANT
BNDG ESMARK 4X12 TAN STRL LF (GAUZE/BANDAGES/DRESSINGS) ×2 IMPLANT
BNDG GAUZE ELAST 4 BULKY (GAUZE/BANDAGES/DRESSINGS) ×2 IMPLANT
CNTNR SPEC 2.5X3XGRAD LEK (MISCELLANEOUS)
CONT SPEC 4OZ STER OR WHT (MISCELLANEOUS)
CONT SPEC 4OZ STRL OR WHT (MISCELLANEOUS)
CONTAINER SPEC 2.5X3XGRAD LEK (MISCELLANEOUS) ×1 IMPLANT
CUFF TOURN SGL QUICK 12 (TOURNIQUET CUFF) ×1 IMPLANT
CUFF TOURN SGL QUICK 18X4 (TOURNIQUET CUFF) IMPLANT
DRAPE FLUOR MINI C-ARM 54X84 (DRAPES) IMPLANT
DURAPREP 26ML APPLICATOR (WOUND CARE) ×2 IMPLANT
ELECT REM PT RETURN 9FT ADLT (ELECTROSURGICAL) ×2
ELECTRODE REM PT RTRN 9FT ADLT (ELECTROSURGICAL) ×1 IMPLANT
GAUZE 4X4 16PLY ~~LOC~~+RFID DBL (SPONGE) ×2 IMPLANT
GAUZE PACKING 1/4 X5 YD (GAUZE/BANDAGES/DRESSINGS) IMPLANT
GAUZE PACKING IODOFORM 1X5 (PACKING) IMPLANT
GAUZE SPONGE 4X4 12PLY STRL (GAUZE/BANDAGES/DRESSINGS) ×3 IMPLANT
GAUZE XEROFORM 1X8 LF (GAUZE/BANDAGES/DRESSINGS) ×2 IMPLANT
GLOVE SURG ENC MOIS LTX SZ7 (GLOVE) ×2 IMPLANT
GLOVE SURG UNDER LTX SZ7 (GLOVE) ×2 IMPLANT
GOWN STRL REUS W/ TWL LRG LVL3 (GOWN DISPOSABLE) ×2 IMPLANT
GOWN STRL REUS W/TWL LRG LVL3 (GOWN DISPOSABLE) ×4
HANDPIECE VERSAJET DEBRIDEMENT (MISCELLANEOUS) IMPLANT
IV NS 1000ML (IV SOLUTION)
IV NS 1000ML BAXH (IV SOLUTION) IMPLANT
IV NS IRRIG 3000ML ARTHROMATIC (IV SOLUTION) IMPLANT
KIT STIMULAN RAPID CURE 5CC (Orthopedic Implant) ×1 IMPLANT
KIT TURNOVER KIT A (KITS) ×2 IMPLANT
LABEL OR SOLS (LABEL) ×2 IMPLANT
MANIFOLD NEPTUNE II (INSTRUMENTS) ×2 IMPLANT
NDL FILTER BLUNT 18X1 1/2 (NEEDLE) ×1 IMPLANT
NDL HYPO 25X1 1.5 SAFETY (NEEDLE) ×2 IMPLANT
NEEDLE FILTER BLUNT 18X 1/2SAF (NEEDLE) ×1
NEEDLE FILTER BLUNT 18X1 1/2 (NEEDLE) ×1 IMPLANT
NEEDLE HYPO 25X1 1.5 SAFETY (NEEDLE) ×4 IMPLANT
NS IRRIG 500ML POUR BTL (IV SOLUTION) ×2 IMPLANT
PACK EXTREMITY ARMC (MISCELLANEOUS) ×2 IMPLANT
PAD ABD DERMACEA PRESS 5X9 (GAUZE/BANDAGES/DRESSINGS) ×2 IMPLANT
PULSAVAC PLUS IRRIG FAN TIP (DISPOSABLE) ×2
RASP SM TEAR CROSS CUT (RASP) IMPLANT
SOL PREP PVP 2OZ (MISCELLANEOUS)
SOLUTION PREP PVP 2OZ (MISCELLANEOUS) ×1 IMPLANT
STOCKINETTE STRL 6IN 960660 (GAUZE/BANDAGES/DRESSINGS) ×2 IMPLANT
SUT ETHILON 3-0 FS-10 30 BLK (SUTURE) ×4
SUT ETHILON 4-0 (SUTURE)
SUT ETHILON 4-0 FS2 18XMFL BLK (SUTURE)
SUT VIC AB 3-0 SH 27 (SUTURE) ×2
SUT VIC AB 3-0 SH 27X BRD (SUTURE) ×1 IMPLANT
SUT VIC AB 4-0 FS2 27 (SUTURE) IMPLANT
SUTURE EHLN 3-0 FS-10 30 BLK (SUTURE) ×2 IMPLANT
SUTURE ETHLN 4-0 FS2 18XMF BLK (SUTURE) IMPLANT
SWAB CULTURE AMIES ANAERIB BLU (MISCELLANEOUS) IMPLANT
SYR 10ML LL (SYRINGE) ×2 IMPLANT
TIP FAN IRRIG PULSAVAC PLUS (DISPOSABLE) IMPLANT
WATER STERILE IRR 500ML POUR (IV SOLUTION) ×2 IMPLANT

## 2021-05-13 NOTE — Transfer of Care (Signed)
Immediate Anesthesia Transfer of Care Note  Patient: [name]  Procedure(s) Performed: AMPUTATION RAY-Partial 4th Ray (Left) INCISION AND DRAINAGE (Left)  Patient Location: PACU  Anesthesia Type:General  Level of Consciousness: drowsy  Airway & Oxygen Therapy: Patient Spontanous Breathing and Patient connected to face mask oxygen  Post-op Assessment: Report given to RN  Post vital signs: stable  Last Vitals:  Vitals Value Taken Time  BP 142/53 05/13/21 1815  Temp    Pulse 59 05/13/21 1817  Resp 11 05/13/21 1817  SpO2 96 % 05/13/21 1817  Vitals shown include unvalidated device data.  Last Pain:  Vitals:   05/13/21 1558  TempSrc: Oral  PainSc: 9          Complications: No notable events documented.

## 2021-05-13 NOTE — TOC Progression Note (Signed)
Transition of Care [facility]) - Progression Note    Patient Details  Name: [name] MRN: [id number] Date of Birth: Mar 08, 1951  [facility]) CM/SW Contact  Caryn Section, RN Phone Number: 05/13/2021, 10:04 AM  Clinical Narrative:   TOC in to see patient.  She states she is having surgery today and is nervous.  Therapeutic listening provided.  Patient states she feels "okay" about the surgery and asks TOC to check in when she gets back.  TOC will return following surgery, contact information given to patient.  TOC to follow    Expected Discharge Plan:  (TBD)    Expected Discharge Plan and Services Expected Discharge Plan:  (TBD)   Discharge Planning Services: CM Consult Post Acute Care Choice: Durable Medical Equipment (TBD)                             HH Arranged:  (TBD)           Social Determinants of Health (SDOH) Interventions    Readmission Risk Interventions No flowsheet data found.

## 2021-05-13 NOTE — Op Note (Signed)
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  Septic joint left fourth metatarsal phalangeal joint 2.  Abscess noted within the flexor tendon sheath going to the mid tarsal area plantarly 3.  Necrosis of tissues at the left fourth digit  PATHOLOGY/SPECIMEN(S): Left fourth metatarsal bone culture; left fourth ray pathology specimen with proximal margin marked in purple ink  INDICATIONS:   [name] is a 70 y.o. female who presents with a nonhealing ulceration to the plantar aspect the left fourth metatarsal phalangeal joint.  Patient was seen by Dr. Ether Griffins and has been seen by Dr. Alberteen Spindle in the past.  Patient was seen recently by Dr. Ether Griffins this week and was noted to have worsening signs of infection so was sent to the hospital for admission and work-up as well as surgical intervention.  Patient had x-ray imaging as well as MRI imaging which did confirm a septic joint to the left fourth metatarsal phalangeal joint.  Patient is also previously had a left fifth metatarsal phalangeal joint resection due to infection ulceration.  Patient is scheduled to have an upcoming angiogram soon but this was unfortunately delayed so we have decided to proceed with surgical intervention prior to revascularization attempt due to concern for worsening infection.  Discussed all treatment options with patient both conservative and surgical attempts at correction include potential risks and complications at this time patient has  elected for surgical intervention as described above.  All questions answered, no guarantees given.  Patient is at very high risk for limb loss due to infection, uncontrolled diabetes, and history of peripheral vascular disease.  DESCRIPTION: After obtaining full informed written consent, the patient was brought back to the operating room and placed supine upon the operating table.  The patient received IV antibiotics prior to induction.  After obtaining adequate anesthesia, the patient was prepped and draped in the standard fashion.  A preoperative block was performed with 20 cc of 1% lidocaine plain about the left fourth ray and fifth rays.  Attention was directed to the left fourth metatarsal phalangeal joint area where a linear longitudinal incision was made parallel to the long axis of the fourth metatarsal dorsally and a racquet type of incision was made around the fourth digit and extended plantarly and the ulcerative site was excised and the incision was extended proximally along the course of the flexor tendon due to purulence expressible.  Large amount of purulence was expressed once the incision was made.  This incision was made straight to bone mainly over the dorsal aspect and in the soft tissues plantarly.  The incisions were deepened to the subcutaneous tissue utilizing sharp and blunt dissection and care was taken to identify and retract all vital neurovascular structures no venous contributories were cauterized as necessary.  At this time circumferential dissection was continued about the fourth metatarsal and the fourth metatarsal phalangeal joint was identified and extensor tenotomy and capsulotomy was performed followed by release the collateral and suspensory ligaments as well as any connection to the flexor tendon.  The fourth digit was disarticulated and passed off in the operative site and  sent off to pathology.  The sagittal bone saw was then used to resect the fourth metatarsal at the  midshaft level leaving more dorsal than plantar with the appropriate beveling.  The fourth metatarsal was then dissected free and passed off in the operative site and the proximal margin was marked in purple ink.  The remaining fourth metatarsal midshaft to proximal shaft there appeared to be hard and did not have any signs or evidence of osteomyelitis present.  The tissues were debrided further removing all nonviable necrotic tissue.  It appeared that there was an abscess present that was extending along the flexor tendon from the fourth toe going about the midportion of the plantar foot.  The incision was opened in this area and bluntly dissected with scissors and purulence was released at this level.  There did not appear to be any purulence persistent past this level proximally.  The surgical site was flushed with copious amounts normal sterile saline using the pulse lavage.  Antibiotic beads with vancomycin impregnated were placed into the fourth metatarsal through the medullary canal and throughout the soft tissues of the operative site and around the flexor tendon course.  At this time the deep structures were reapproximated well coapted with 3-0 Vicryl.  Difficult closure was obtained but rotational flap was performed such that the flap was rotated anteriorly and plantarly approximately 2 cm and the large ulceration that was present preoperatively was able to be completely closed.  The preoperative wound measurement was approximately 1.5 cm x 2 cm x 0.5 cm.  The rotational flap was completed and held into place with 3-0 nylon.  The skin was then reapproximated well coapted once the flap was rotated into position to cover the void and sutured with 3-0 nylon.  The skin incision along the entirety was then closed with 3-0 nylon in a combination of simple and horizontal mattress type stitching.  The skin in the area still between the third toe and fifth toe appeared to be questionable as it appeared to be  slightly discolored but it was decided to try to close this area to complete the closure but the skin bridge may necrosis.  An additional 10 cc of half percent Marcaine plain was injected about the operative site.  The pneumatic ankle tourniquet was deflated and a prompt hyperemic response was noted all digits left foot.  A fair amount of bleeding occurred during the procedure overall so believe the patient likely has potentially adequate blood flow to heal procedure but would still recommend obtaining CT angiogram with possible intervention with vascular to determine this fracture as patient has well-known history of peripheral vascular disease.  Applied postoperative dressing consisting of Betadine soaked gauze in the webspace at the fifth toe area due to maceration present preoperatively.  Applied Xeroform to the incision line followed by 4 x 4 gauze, ABD, Kerlix, Ace wrap.  Patient tolerated the procedure and anesthesia well was transferred to the recovery room vital signs stable vascular status appearing to be intact to the left foot.  Patient will be discharged back to the inpatient room with the appropriate orders and instructions.  We will change dressing tomorrow at some point in the late morning or early afternoon for further assessment.  COMPLICATIONS: None  CONDITION: Good, stable  Rosetta Posner, DPM

## 2021-05-13 NOTE — Progress Notes (Signed)
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 marrow edema with linear low T1/T2 signal changes at the central navicular bone compatible with a subchondral fracture (series 10, image 16). No additional  fractures. There is degenerative changes within the forefoot and midfoot, most pronounced at the second tarsometatarsal joint. Ligaments Intact Lisfranc ligament. Muscles and Tendons Chronic denervation changes of the intrinsic foot musculature. Flexor tenosynovitis of the fourth toe with fluid tracking into the proximal forefoot. Soft tissues Deep ulceration underlying the fourth metatarsal head.  There is a contiguous fluid collection which involves the fourth MTP joint space measuring 2.2 x 1.9 x 2.0 cm. Small lobulated fluid collection tracks within the superficial soft tissues underlying the plantar fascia at the mid forefoot measuring approximately 2.7 x 0.5 x 1.1 cm. This appears contiguous with the overlying tenosynovial fluid collection related to the fourth toe flexor tendon (series 6, images 29-30). IMPRESSION: 1. Deep ulceration underlying the fourth metatarsal head with contiguous fluid collection extending to the fourth MTP joint space measuring up to 2.2 cm. Findings are compatible with septic arthritis of the fourth MTP joint. Acute osteomyelitis of the fourth toe proximal phalanx and fourth metatarsal head. 2. Small lobulated fluid collection tracks within the superficial soft tissues underlying the plantar fascia at the level of the mid forefoot measuring 2.7 x 0.5 x 1.1 cm. This appears contiguous with the overlying tenosynovial fluid collection related to the fourth toe flexor tendon. 3. Findings of reactive osteitis of the third toe proximal phalanx. 4. Subchondral fracture of the navicular bone, likely subacute. 5. Postsurgical changes of prior partial fifth ray resection without evidence of osteomyelitis of the residual fifth toe or fifth metatarsal. Electronically Signed   By: Duanne Guess D.O.   On: 05/11/2021 16:15   DG Foot Complete Left  Result Date: 05/11/2021 CLINICAL DATA:  Wound infection to the left foot with redness spreading from the site and worsening pain x3 days. EXAM:  LEFT FOOT - COMPLETE 3+ VIEW COMPARISON:  Left foot radiograph 05/16/2017 FINDINGS: Stable postoperative changes of fifth metatarsal amputation at the level of the mid shaft. The fifth toe proximal phalanx is not present, unchanged compared to 01/12/2013. Small amount of subcutaneous emphysema is noted within the plantar soft tissues of the distal lateral forefoot, likely corresponding to reported wound. No underlying cortical abnormality. Mild osteoarthritic changes of the midfoot. Small posterior and plantar calcaneal enthesophytes. Os peroneum. IMPRESSION: Small amount of subcutaneous emphysema within the plantar soft tissues of the distal lateral forefoot, likely related to reported wound. No underlying osseous abnormality to suggest osteomyelitis. However, if there is clinical concern for osteomyelitis, MRI of the left foot can be obtained. Electronically Signed   By: Sherron Ales M.D.   On: 05/11/2021 14:49    Blood pressure (!) 164/64, pulse 66, temperature 99.1 F (37.3 C), temperature source Oral, resp. rate 18, height  (1.727 m), weight 77.6 kg, SpO2 90 %.  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 -X-ray imaging and MRI reviewed and discussed with patient in detail.  Appears to show osteomyelitis of the left fourth metatarsal phalangeal joint with associated abscess that may be tracking along the flexor tendon sheath and at the left fourth toe and fourth metatarsal phalangeal joint consistent with septic joint. -Vascular team has been consulted.  Plan to do an angiogram at some point this week. -Appreciate medicine recommendations for IV antibiotic therapy, wound culture growing MRSA. -Applied Betadine wet-to-dry dressing. -Discussed treatment  options with patient.  Believe that patient would be best served with a left partial fourth ray amputation with excision of wound and closure.  Patient may require also left fifth toe amputation and revision of fifth metatarsal excision site for purposes of closure of the wound.  Discussed with patient that she is at high risk for further more proximal amputation  due to infection present.  If this fails patient will likely need transmetatarsal amputation and if that fails patient may require below-knee amputation depending on surgical outcome and infection control.  Believe that it will be a difficult closure as patient has a lot of tissue loss and she may ultimately have an open wound to the area that may require local wound care.  If this tissue does not look good then patient may necessitate the need for a transmetatarsal amputation.  Discussed serious possibility of this in the future. -Patient very emotional with this discussion today.  Patient is agreeable to procedure.  We will likely perform today late afternoon around 4:30 PM.  N.p.o. order in place.  Rosetta Posner 05/13/2021, 1:06 PM

## 2021-05-13 NOTE — H&P (Signed)
HISTORY AND PHYSICAL INTERVAL NOTE:  05/13/2021  4:48 PM  [name]  has presented today for surgery, with the diagnosis of Fourth Ray Amputation Fifth Ray Amputation Possible Incision and Drainage.  The various methods of treatment have been discussed with the patient.  No guarantees were given.  After consideration of risks, benefits and other options for treatment, the patient has consented to surgery.  I have reviewed the patients' chart and labs.    PROCEDURE: LEFT PARTIAL 4TH AND 5TH RAY AMPUTATIONS, ANTIBIOTIC BEAD APPLICATION  A history and physical examination was performed in the hospital.  The patient was reexamined.  There have been no changes to this history and physical examination.  Rosetta Posner, DPM

## 2021-05-13 NOTE — Progress Notes (Signed)
[facility]  - [place] [facility]   PATIENT NAME: [name]    MR#:  [id number]  DATE OF BIRTH:  03-02-1951  SUBJECTIVE:   patient quite anxious this morning. Daughter at bedside. She is tearful regarding her procedures. Complains of dry throat and wants ice chips. She is NPO for surgery REVIEW OF SYSTEMS:   Review of Systems  Constitutional:  Positive for malaise/fatigue. Negative for chills, fever and weight loss.  HENT:  Negative for ear discharge, ear pain and nosebleeds.   Eyes:  Negative for blurred vision, pain and discharge.  Respiratory:  Negative for sputum production, shortness of breath, wheezing and stridor.   Cardiovascular:  Negative for chest pain, palpitations, orthopnea and PND.  Gastrointestinal:  Negative for abdominal pain, diarrhea, nausea and vomiting.  Genitourinary:  Negative for frequency and urgency.  Musculoskeletal:  Negative for back pain and joint pain.  Neurological:  Negative for sensory change, speech change, focal weakness and weakness.  Psychiatric/Behavioral:  Negative for depression and hallucinations. The patient is nervous/anxious.   Tolerating Diet: Tolerating PT:   DRUG ALLERGIES:   Allergies  Allergen Reactions   Metformin And Related Diarrhea   Oxycodone Hcl Nausea And Vomiting    Can still take this for pain medicine   Other Cough   Atorvastatin Cough   Biaxin [Clarithromycin] Itching   Codeine Nausea Only and Other (See Comments)    Constipates; doesn't like the way it makes her feel    Isosorbide Itching and Cough   Losartan Itching and Cough   Zetia [Ezetimibe] Diarrhea    VITALS:  Blood pressure (!) 164/64, pulse 66, temperature 99.1 F (37.3 C), temperature source Oral, resp. rate 18, height 5\' 8"  (1.727 m), weight 77.6 kg, SpO2 90 %.  PHYSICAL EXAMINATION:   Physical Exam  GENERAL:  70 y.o.-year-old patient lying in the bed with no acute distress.  LUNGS: Normal breath sounds bilaterally,  no wheezing, rales, rhonchi. No use of accessory muscles of respiration.  CARDIOVASCULAR: S1, S2 normal. No murmurs, rubs, or gallops.  ABDOMEN: Soft, nontender, nondistended. Bowel sounds present. No organomegaly or mass.  EXTREMITIES:    NEUROLOGIC:non focal   PSYCHIATRIC:  patient is alert and oriented x 3.  SKIN: as above  LABORATORY PANEL:  CBC Recent Labs  Lab 05/13/21 0316  WBC 8.5  HGB 9.7*  HCT 28.3*  PLT 217    Chemistries  Recent Labs  Lab 05/11/21 1328 05/12/21 0400 05/13/21 0316  NA 132*   < > 136  K 3.8   < > 3.9  CL 97*   < > 102  CO2 25   < > 26  GLUCOSE 292*   < > 198*  BUN 25*   < > 30*  CREATININE 0.98   < > 0.89  CALCIUM 8.6*   < > 8.4*  AST 19  --   --   ALT 20  --   --   ALKPHOS 109  --   --   BILITOT 1.0  --   --    < > = values in this interval not displayed.   Cardiac Enzymes No results for input(s): TROPONINI in the last 168 hours. RADIOLOGY:  MR FOOT LEFT WO CONTRAST  Result Date: 05/11/2021 CLINICAL DATA:  Limping, acute, foot pain, infection suspected. Evaluate for necrotizing fasciitis and osteomyelitis EXAM: MRI OF THE LEFT FOOT WITHOUT CONTRAST TECHNIQUE: Multiplanar, multisequence MR imaging of the left forefoot was performed. No intravenous contrast was administered.  COMPARISON:  X-ray 05/11/2021, 08/24/2013 FINDINGS: Bones/Joint/Cartilage Bone marrow edema with confluent low T1 marrow signal changes within the fourth toe proximal phalanx as well as within the fourth metatarsal head. Findings are compatible with acute osteomyelitis. Large complex fourth MTP joint effusion, most compatible with septic arthritis. Bone marrow edema within the proximal phalanx of the left third toe with preservation of the T1 marrow signal suggesting reactive osteitis. Postsurgical changes of prior partial fifth ray resection without evidence of osteomyelitis of the residual fifth toe or fifth metatarsal. Bone marrow edema with linear low T1/T2 signal changes  at the central navicular bone compatible with a subchondral fracture (series 10, image 16). No additional fractures. There is degenerative changes within the forefoot and midfoot, most pronounced at the second tarsometatarsal joint. Ligaments Intact Lisfranc ligament. Muscles and Tendons Chronic denervation changes of the intrinsic foot musculature. Flexor tenosynovitis of the fourth toe with fluid tracking into the proximal forefoot. Soft tissues Deep ulceration underlying the fourth metatarsal head. There is a contiguous fluid collection which involves the fourth MTP joint space measuring 2.2 x 1.9 x 2.0 cm. Small lobulated fluid collection tracks within the superficial soft tissues underlying the plantar fascia at the mid forefoot measuring approximately 2.7 x 0.5 x 1.1 cm. This appears contiguous with the overlying tenosynovial fluid collection related to the fourth toe flexor tendon (series 6, images 29-30). IMPRESSION: 1. Deep ulceration underlying the fourth metatarsal head with contiguous fluid collection extending to the fourth MTP joint space measuring up to 2.2 cm. Findings are compatible with septic arthritis of the fourth MTP joint. Acute osteomyelitis of the fourth toe proximal phalanx and fourth metatarsal head. 2. Small lobulated fluid collection tracks within the superficial soft tissues underlying the plantar fascia at the level of the mid forefoot measuring 2.7 x 0.5 x 1.1 cm. This appears contiguous with the overlying tenosynovial fluid collection related to the fourth toe flexor tendon. 3. Findings of reactive osteitis of the third toe proximal phalanx. 4. Subchondral fracture of the navicular bone, likely subacute. 5. Postsurgical changes of prior partial fifth ray resection without evidence of osteomyelitis of the residual fifth toe or fifth metatarsal. Electronically Signed   By: Duanne Guess D.O.   On: 05/11/2021 16:15   ASSESSMENT AND PLAN:  AVIV ROTA is a 70 y.o. female with  medical history significant for depression, coronary artery disease status post CABG, diabetes mellitus, hypertension, peripheral arterial disease who was sent to the emergency room by her podiatrist after she had gone to see him for evaluation of a 2-day history of increasing pain, swelling over the dorsum of her left foot as well as discoloration of the medial portion of the fourth toe with purulent drainage.  Left Diabetic foot infection -MRI confirm osteomyelitis of 4th digit on left foot with fluid/abscess -podiatry following and plans are for amputation on 10/5 per Dr Excell Seltzer -cultures have been sent -currently on ceftriaxone, flagyl and vancomycin--will narrow abxs after surgery   Peripheral arterial diease, severe ,bilateral with angioplasty and stent placements in the past -vascular surgery following--plans for angiogram later this week -plan for arteriogram and possible revascularization of left foot later this week   Diabetes type 2 with severe PAD -glipizide on hold -continue on SSI -blood sugars have been stable   Nutrition Status: Nutrition Problem: Moderate Malnutrition Etiology: acute illness Signs/Symptoms: mild muscle depletion, mild fat depletion, moderate fat depletion Interventions: Refer to RD note for recommendations     DVT prophylaxis: enoxaparin (LOVENOX) injection 40  mg Start: 05/11/21 1500  Code Status: full code Family Communication: discussed with patient's dter at bedside Disposition Plan: Status is: Inpatient   Remains inpatient appropriate because:Hemodynamically unstable, IV treatments appropriate due to intensity of illness or inability to take PO, and Inpatient level of care appropriate due to severity of illness   Dispo: The patient is from: Home              Anticipated d/c is to: Home              Patient currently is not medically stable to d/c.              Difficult to place patient No       TOTAL TIME TAKING CARE OF THIS PATIENT: 25  minutes.  >50% time spent on counselling and coordination of care  Note: This dictation was prepared with Dragon dictation along with smaller phrase technology. Any transcriptional errors that result from this process are unintentional.  Enedina Finner M.D    [facility]   CC: Primary care physician; Marguarite Arbour, MD Patient ID: [name], female   DOB: 07-25-1951, 70 y.o.   MRN: [id number]

## 2021-05-13 NOTE — Anesthesia Postprocedure Evaluation (Signed)
Anesthesia Post Note  Patient: Joby Hershkowitz Valeri  Procedure(s) Performed: AMPUTATION RAY-Partial 4th Ray (Left) INCISION AND DRAINAGE (Left)  Patient location during evaluation: PACU Anesthesia Type: General Level of consciousness: awake and alert Pain management: pain level controlled Vital Signs Assessment: post-procedure vital signs reviewed and stable Respiratory status: spontaneous breathing, nonlabored ventilation, respiratory function stable and patient connected to nasal cannula oxygen Cardiovascular status: blood pressure returned to baseline and stable Postop Assessment: no apparent nausea or vomiting Anesthetic complications: no   No notable events documented.   Last Vitals:  Vitals:   05/13/21 1855 05/13/21 1900  BP:  (!) 155/67  Pulse: 62 (!) 58  Resp: (!) 24 14  Temp: (P) 36.6 C   SpO2: 92% 92%    Last Pain:  Vitals:   05/13/21 1835  TempSrc:   PainSc: 0-No pain                 Cleda Mccreedy Makyle Eslick

## 2021-05-13 NOTE — Anesthesia Procedure Notes (Signed)
Procedure Name: LMA Insertion Date/Time: 05/13/2021 5:04 PM Performed by: Jaye Beagle, CRNA Pre-anesthesia Checklist: Patient identified, Emergency Drugs available, Suction available and Patient being monitored Patient Re-evaluated:Patient Re-evaluated prior to induction Oxygen Delivery Method: Circle system utilized Preoxygenation: Pre-oxygenation with 100% oxygen Induction Type: IV induction Ventilation: Mask ventilation without difficulty LMA: LMA inserted LMA Size: 4.0 Number of attempts: 1 Tube secured with: Tape Dental Injury: Teeth and Oropharynx as per pre-operative assessment

## 2021-05-13 NOTE — Progress Notes (Signed)
[facility] Daily Progress Note  Subjective: Patient with continued anxiety this AM.  No acute issues overnight.  Patient continues to feel overwhelmed about upcoming podiatry, vascular and gastroenterology issues.  Objective: Vitals:   05/12/21 1600 05/12/21 2018 05/13/21 0558 05/13/21 0809  BP: 129/71 136/60 (!) 157/56 (!) 164/64  Pulse: (!) 55 66 73 66  Resp: 15 18 19 18   Temp: 98.2 F (36.8 C) 98.6 F (37 C) 99.6 F (37.6 C) 99.1 F (37.3 C)  TempSrc:  Oral Oral Oral  SpO2: 94% 93% 94% 90%  Weight:      Height:        Intake/Output Summary (Last 24 hours) at 05/13/2021 1243 Last data filed at 05/13/2021 0957 Gross per 24 hour  Intake 640 ml  Output 100 ml  Net 540 ml   Physical Exam: A&Ox3, NAD CV: RRR Pulmonary: CTA Bilaterally Abdomen: Soft, Nontender, Nondistended Vascular:  Left lower extremity: Thigh soft.  Calf soft.  Extremities warm distally.  I did not remove the podiatry dressing.  Motor/sensory is intact.   Laboratory: CBC    Component Value Date/Time   WBC 8.5 05/13/2021 0316   HGB 9.7 (L) 05/13/2021 0316   HCT 28.3 (L) 05/13/2021 0316   PLT 217 05/13/2021 0316   BMET    Component Value Date/Time   NA 136 05/13/2021 0316   NA 136 09/26/2013 0928   K 3.9 05/13/2021 0316   K 4.5 09/26/2013 0928   CL 102 05/13/2021 0316   CL 104 09/26/2013 0928   CO2 26 05/13/2021 0316   CO2 31 09/26/2013 0928   GLUCOSE 198 (H) 05/13/2021 0316   GLUCOSE 145 (H) 09/26/2013 0928   BUN 30 (H) 05/13/2021 0316   BUN 19 (H) 09/26/2013 0928   CREATININE 0.89 05/13/2021 0316   CREATININE 0.85 09/26/2013 0928   CALCIUM 8.4 (L) 05/13/2021 0316   CALCIUM 9.8 09/26/2013 0928   GFRNONAA >60 05/13/2021 0316   GFRNONAA >60 09/26/2013 0928   GFRAA >60 02/27/2020 1633   GFRAA >60 09/26/2013 0928   Assessment/Planning: The patient is a 70 year old female with known history of atherosclerotic disease whom we have treated endovascularly and open in  the past presents with chronic nonhealing left foot wound  1) Patient continues to be very anxious and "overwhelmed" by her recent health issues. The patient is on the schedule to undergo surgery with podiatry today. 2) At this point, we will hold off on the angiogram and plan to move forward with it later on in the week.  The patient seems to be comfortable with this idea. 3) The patient was offered anxiety medication however she refused this yesterday. 4) I also asked the patient if she would like to speak with the chaplain and she refused. 5) Will continue to follow.  Discussed with Dr. 78 / [name] PA-C 05/13/2021 12:43 PM
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
## 2021-05-14 ENCOUNTER — Other Ambulatory Visit (INDEPENDENT_AMBULATORY_CARE_PROVIDER_SITE_OTHER): Payer: Self-pay | Admitting: Vascular Surgery

## 2021-05-14 ENCOUNTER — Encounter: Payer: Self-pay | Admitting: Podiatry

## 2021-05-14 LAB — GLUCOSE, CAPILLARY
Glucose-Capillary: 244 mg/dL — ABNORMAL HIGH (ref 70–99)
Glucose-Capillary: 297 mg/dL — ABNORMAL HIGH (ref 70–99)
Glucose-Capillary: 283 mg/dL — ABNORMAL HIGH (ref 70–99)
Glucose-Capillary: 146 mg/dL — ABNORMAL HIGH (ref 70–99)

## 2021-05-14 MED ORDER — INSULIN ASPART 100 UNIT/ML IJ SOLN
3.0000 [IU] | Freq: Three times a day (TID) | INTRAMUSCULAR | Status: DC
  Administered 2021-05-14 (×2): 3 [IU] via SUBCUTANEOUS
  Filled 2021-05-14 (×2): qty 1

## 2021-05-14 MED ORDER — INSULIN GLARGINE-YFGN 100 UNIT/ML ~~LOC~~ SOLN
5.0000 [IU] | Freq: Every day | SUBCUTANEOUS | Status: DC
  Administered 2021-05-14: 5 [IU] via SUBCUTANEOUS
  Filled 2021-05-14 (×2): qty 0.05

## 2021-05-14 MED ORDER — VANCOMYCIN HCL 750 MG/150ML IV SOLN
750.0000 mg | Freq: Two times a day (BID) | INTRAVENOUS | Status: DC
  Administered 2021-05-14 – 2021-05-15 (×3): 750 mg via INTRAVENOUS
  Filled 2021-05-14 (×5): qty 150

## 2021-05-14 MED ORDER — SODIUM CHLORIDE 0.9 % IV SOLN: INTRAVENOUS | Status: DC

## 2021-05-14 MED ORDER — FLUCONAZOLE 100 MG PO TABS
200.0000 mg | ORAL_TABLET | Freq: Once | ORAL | Status: AC
  Administered 2021-05-14: 200 mg via ORAL
  Filled 2021-05-14: qty 2

## 2021-05-14 NOTE — Evaluation (Signed)
Physical Therapy Evaluation Patient Details Name: [name] MRN: [id number] DOB: [birth year] Today's Date: 05/14/2021  History of Present Illness  Pt is a 70 y.o. 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  PMH includes CHF, anxiety, a-fib, htn, PAD, PE s/p CABG, DM, mandible fx sx, peripheral vascular catheterization.  Clinical Impression  Prior to hospital admission, pt was independent with ambulation; lives with her husband in main level apt.  Currently pt is SBA with bed mobility and CGA to min assist with transfers using RW.  Able to maintain L LE NWB'ing with cueing.  Pt appearing anxious during sessions activities (in particular using walker) and pt reporting h/o using knee scooter that she felt she would be more comfortable with (pt would benefit from knee scooter trial in future PT sessions to determine appropriateness).  Pt would benefit from skilled PT to address noted impairments and functional limitations (see below for any additional details).  Upon hospital discharge, pt would benefit from HHPT and 24/7 assist.       Recommendations for follow up therapy are one component of a multi-disciplinary discharge planning process, led by the attending physician.  Recommendations may be updated based on patient status, additional functional criteria and insurance authorization.  Follow Up Recommendations Home health PT;Supervision/Assistance - 24 hour    Equipment Recommendations  Rolling walker with 5" wheels;3in1 (PT);Wheelchair (measurements PT);Wheelchair cushion (measurements PT)    Recommendations for Other Services OT consult     Precautions / Restrictions Precautions Precautions: Fall Precaution Comments: Elevate L LE on 2 pillows Restrictions Weight Bearing Restrictions: Yes LLE Weight Bearing:  Non weight bearing Other Position/Activity Restrictions: NWB L LE s/p partial amp of 4th ray and I&D; may put weight on heel for transfers only on L foot in surgical shoe      Mobility  Bed Mobility Overal bed mobility: Needs Assistance Bed Mobility: Supine to Sit;Sit to Supine     Supine to sit: Supervision;HOB elevated Sit to supine: Supervision;HOB elevated   General bed mobility comments: mild increased effort to perform on own    Transfers Overall transfer level: Needs assistance Equipment used: Rolling walker (2 wheeled) Transfers: Sit to/from [employer] Sit to Stand: Min assist;From elevated surface Stand pivot transfers: Min guard;Min assist       General transfer comment: vc's for UE/LE placement and NWB'ing status; assist to steady intermittently; vc's for transfer technique and walker use; used R shoe during transfer BSC back to bed  Ambulation/Gait Ambulation/Gait assistance: Min guard;Min assist Gait Distance (Feet):  (pt able to hop a couple times with RW (keeping L LE NWB'ing)) Assistive device: Rolling walker (2 wheeled)   Gait velocity: decreased   General Gait Details: vc's for technique and walker use  Stairs            Wheelchair Mobility    Modified Rankin (Stroke Patients Only)       Balance Overall balance assessment: Needs assistance Sitting-balance support: No upper extremity supported;Feet supported Sitting balance-Leahy Scale: Normal Sitting balance - Comments: steady sitting reaching outside BOS   Standing balance support: Single extremity supported Standing balance-Leahy Scale: Poor Standing balance comment: occasional loss of balance standing with single UE support on RW donning/doffing briefs for toileting (min assist provided for balance)  Pertinent Vitals/Pain Pain Assessment: 0-10 Pain Score: 10-Worst pain ever (end of PT session) Pain Location: LLE at surgical  site Pain Descriptors / Indicators: Aching;Discomfort Pain Intervention(s): Limited activity within patient's tolerance;Monitored during session;Repositioned;Other (comment) (nurse present when pt reporting 10/10 pain level) Vitals (HR and O2 on room air) stable and WFL throughout treatment session.    Home Living Family/patient expects to be discharged to:: Private residence Living Arrangements: Spouse/significant other Available Help at Discharge: Family;Available PRN/intermittently Type of Home: Apartment Home Access: Level entry (curb step from parking lot (curb cut out further away from parking spot))     Home Layout: One level Home Equipment: Toilet riser Additional Comments: Has tall (15 inch) mattress for bed    Prior Function Level of Independence: Independent         Comments: Independent with mobility     Hand Dominance   Dominant Hand: Right    Extremity/Trunk Assessment   Upper Extremity Assessment Upper Extremity Assessment: Overall WFL for tasks assessed    Lower Extremity Assessment Lower Extremity Assessment: Generalized weakness    Cervical / Trunk Assessment Cervical / Trunk Assessment: Normal  Communication   Communication: No difficulties  Cognition Arousal/Alertness: Awake/alert Behavior During Therapy: Anxious Overall Cognitive Status: Within Functional Limits for tasks assessed                                 General Comments: Pt reporting concerns regarding mobility and future surgeries      General Comments General comments (skin integrity, edema, etc.): L foot in dressing/ace wrap    Exercises Transfer training; WB'ing education   Assessment/Plan    PT Assessment Patient needs continued PT services  PT Problem List Decreased strength;Decreased activity tolerance;Decreased balance;Decreased mobility;Decreased knowledge of use of DME;Decreased knowledge of precautions;Pain;Decreased skin integrity       PT  Treatment Interventions DME instruction;Gait training;Stair training;Functional mobility training;Therapeutic activities;Therapeutic exercise;Balance training;Patient/family education    PT Goals (Current goals can be found in the Care Plan section)  Acute Rehab PT Goals Patient Stated Goal: to go home PT Goal Formulation: With patient Time For Goal Achievement: 05/28/21 Potential to Achieve Goals: Good    Frequency 7X/week   Barriers to discharge        Co-evaluation               AM-PAC PT "6 Clicks" Mobility  Outcome Measure Help needed turning from your back to your side while in a flat bed without using bedrails?: None Help needed moving from lying on your back to sitting on the side of a flat bed without using bedrails?: A Little Help needed moving to and from a bed to a chair (including a wheelchair)?: A Little Help needed standing up from a chair using your arms (e.g., wheelchair or bedside chair)?: A Little Help needed to walk in hospital room?: A Lot Help needed climbing 3-5 steps with a railing? : Total 6 Click Score: 16    End of Session Equipment Utilized During Treatment: Gait belt Activity Tolerance: Patient tolerated treatment well Patient left: in bed;with call bell/phone within reach;with bed alarm set;with nursing/sitter in room;with family/visitor present;Other (comment) (L LE elevated on 2 pillows) Nurse Communication: Mobility status;Precautions;Weight bearing status PT Visit Diagnosis: Unsteadiness on feet (R26.81);Other abnormalities of gait and mobility (R26.89);Muscle weakness (generalized) (M62.81);Pain Pain - Right/Left: Left Pain - part of body: Ankle and joints of foot    Time: 1529-1610 PT  Time Calculation (min) (ACUTE ONLY): 41 min   Charges:   PT Evaluation $PT Eval Low Complexity: 1 Low PT Treatments $Therapeutic Activity: 23-37 mins        Hendricks Limes, PT 05/14/21, 4:54 PM

## 2021-05-14 NOTE — Evaluation (Signed)
Occupational Therapy Evaluation Patient Details Name: [name] MRN: [id number] DOB: [birth year] Today's Date: 05/14/2021   History of Present Illness [name] is a 70 y.o. female with medical history significant for depression, coronary artery disease status post CABG, diabetes mellitus, hypertension, peripheral arterial disease no s/p left partial fourth ray amputation with antibiotic bead placement and rotational skin flap closure on 05/13/21.   Clinical Impression   [name] was seen for OT evaluation this date. Prior to hospital admission, pt was Independent for mobility and I/ADLs. Pt lives with husband in level entry apartment - husband works 2am-11am. Pt presents to acute OT demonstrating impaired ADL performance and functional mobility 2/2 decreased activity tolerance, functional strength/ROM/balance deficits, and poor insight into deficits. Upon arrival pt reclined in bed reporting need to pee and requesting to "walk to bathroom just putting weight through my heel." Pt instructed on functional application of LLE NWBing instructions (Podiatry secure messaged re: Wbing status and cleared pt for heel Wbing in post op shoe for t/f only, no heel walking).   Pt currently requires MOD A + RW for BSC t/f - assist to maintain NWBing pcns and stabilize/advance RW. SBA perihygeine with lateral leans on BSC. MOD A + RW to manage underwear in standing - assist 2/2 mulitple small LOBs and constant cueing to maitain NWBing pcns, multiple instances of pt placing LLE on floor. MOD I doff post op shoe at bed level. Pt would benefit from skilled OT to address noted impairments to maximize safety and independence while minimizing falls risk and caregiver burden. Upon hospital discharge, recommend HHOT with 24/7 supervision, if unable to have 24/7 SUPERVISION pt is appropriate for STR.      Recommendations for follow up therapy are one component of a multi-disciplinary discharge planning process, led  by the attending physician.  Recommendations may be updated based on patient status, additional functional criteria and insurance authorization.   Follow Up Recommendations  Home health OT;Supervision/Assistance - 24 hour    Equipment Recommendations  3 in 1 bedside commode    Recommendations for Other Services       Precautions / Restrictions Precautions Precautions: Fall Restrictions Weight Bearing Restrictions: Yes LLE Weight Bearing: Non weight bearing      Mobility Bed Mobility Overal bed mobility: Needs Assistance Bed Mobility: Supine to Sit;Sit to Supine     Supine to sit: Supervision;HOB elevated Sit to supine: Supervision        Transfers Overall transfer level: Needs assistance Equipment used: Rolling walker (2 wheeled) Transfers: Stand Pivot Transfers;Sit to/from Stand Sit to Stand: Min assist;From elevated surface Stand pivot transfers: Mod assist       General transfer comment: cues to maintain NWBing, requires    Balance Overall balance assessment: Needs assistance Sitting-balance support: No upper extremity supported;Feet supported Sitting balance-Leahy Scale: Fair     Standing balance support: Single extremity supported;During functional activity Standing balance-Leahy Scale: Poor Standing balance comment: lateral LOBs with single UE support                           ADL either performed or assessed with clinical judgement   ADL Overall ADL's : Needs assistance/impaired                                       General ADL Comments: MOD A + RW for [facility]  t/f - assist to maintain NWBing pcns and stabilize/advance RW. SBA perihygeine with lateral leans on BSC. MOD A + RW to manage underwear in standing - assist 2/2 mulitple small LOBs and constant cueing to maitain NWBing pcns, multiple instances of pt placing LLE on floor. MOD I doff post op shoe at bed level.      Pertinent Vitals/Pain Pain Assessment: 0-10 Pain  Score: 9  Pain Location: LLE at surgical site Pain Descriptors / Indicators: Discomfort;Grimacing;Crying Pain Intervention(s): Limited activity within patient's tolerance;Repositioned     Hand Dominance Right   Extremity/Trunk Assessment Upper Extremity Assessment Upper Extremity Assessment: Overall WFL for tasks assessed   Lower Extremity Assessment Lower Extremity Assessment: Generalized weakness       Communication Communication Communication: No difficulties   Cognition Arousal/Alertness: Awake/alert Behavior During Therapy: Anxious Overall Cognitive Status: Within Functional Limits for tasks assessed                                 General Comments: pt reports anxiousness re: mobility and future surgeries   General Comments       Exercises Exercises: Other exercises Other Exercises Other Exercises: Pt educated re: OT role, DME recs, d/c recs, falls prevention, functional application of NWBing pcns, adapted dressing/bathing/toileting techniques Other Exercises: LBD, toileting, sup<>sit, sit<>stand, SPT, sitting/standing balance/tolerance   Shoulder Instructions      Home Living Family/patient expects to be discharged to:: Private residence Living Arrangements: Spouse/significant other Available Help at Discharge: Family;Available PRN/intermittently Type of Home: Apartment Home Access: Level entry (curb step from parking lot (pt reports far to walk))     Home Layout: One level     Bathroom Shower/Tub: Chief Strategy Officer: Standard     Home Equipment: None          Prior Functioning/Environment Level of Independence: Independent        Comments: Independent I/ADLs and mobility including driving        OT Problem List: Decreased strength;Decreased range of motion;Decreased activity tolerance;Impaired balance (sitting and/or standing);Decreased safety awareness;Decreased knowledge of use of DME or AE;Decreased knowledge  of precautions      OT Treatment/Interventions: Self-care/ADL training;Therapeutic exercise;Energy conservation;DME and/or AE instruction;Therapeutic activities;Patient/family education;Balance training    OT Goals(Current goals can be found in the care plan section) Acute Rehab OT Goals Patient Stated Goal: to go home OT Goal Formulation: With patient/family Time For Goal Achievement: 05/28/21 Potential to Achieve Goals: Good ADL Goals Pt Will Perform Grooming: standing;with min assist Pt Will Perform Lower Body Dressing: with adaptive equipment;sitting/lateral leans;with modified independence (w/ LRAD prn) Pt Will Transfer to Toilet: bedside commode;ambulating;with min guard assist (while maintaining NWBing pcns with LRAD precautions)  OT Frequency: Min 2X/week   Barriers to D/C: Decreased caregiver support          Co-evaluation              AM-PAC OT "6 Clicks" Daily Activity     Outcome Measure Help from another person eating meals?: None Help from another person taking care of personal grooming?: None Help from another person toileting, which includes using toliet, bedpan, or urinal?: A Lot Help from another person bathing (including washing, rinsing, drying)?: A Little Help from another person to put on and taking off regular upper body clothing?: A Little Help from another person to put on and taking off regular lower body clothing?: A Lot 6 Click Score: 18  End of Session Equipment Utilized During Treatment: Rolling walker;Gait belt  Activity Tolerance: Patient tolerated treatment well Patient left: in bed;with call bell/phone within reach;with bed alarm set;with family/visitor present  OT Visit Diagnosis: Other abnormalities of gait and mobility (R26.89);Muscle weakness (generalized) (M62.81)                Time: 5638-9373 OT Time Calculation (min): 28 min Charges:  OT General Charges $OT Visit: 1 Visit OT Evaluation $OT Eval Moderate Complexity: 1 Mod OT  Treatments $Self Care/Home Management : 8-22 mins  Kathie Dike, M.S. OTR/L  05/14/21, 4:10 PM  ascom [phone]

## 2021-05-14 NOTE — Consult Note (Signed)
Pharmacy Antibiotic Note  [name] is a 70 y.o. female admitted on 05/11/2021 with  DFI/osteomyelitis .  She underwent surgery 10/5 with partial 4th ray amputation and drainage of abscess within flexor tendon sheath.  Pharmacy has been consulted for Vancomcyin dosing.  Today, 05/14/2021 Day #3 antibiotics (vanco + ceftriaxone + metronidazole) No labs this am Renal: SCr WNL Afebrile 10/3 L foot cx with MRSA  Plan: Adjust vancomycin to 750mg  IV q12h to bump up trough in setting on OM.   AUC = 494 (VT = 15.4) SCr used 0.89 Follow renal function Check vancomycin levels as indicated based on LOT.   Continue Ceftriaxone 2g q24h and metronidazole 500mg  q12h IV as ordered F/u de-escalation with final OR culture   Height: 5\' 8"  (172.7 cm) Weight: 77.6 kg (171 lb 1.2 oz) IBW/kg (Calculated) : 63.9  Temp (24hrs), Avg:98.1 F (36.7 C), Min:97.6 F (36.4 C), Max:99.4 F (37.4 C)  Recent Labs  Lab 05/11/21 1328 05/11/21 1559 05/12/21 0400 05/13/21 0316  WBC 14.4*  --  9.6 8.5  CREATININE 0.98  --  0.92 0.89  LATICACIDVEN 1.2 1.2  --   --      Estimated Creatinine Clearance: 65.4 mL/min (by C-G formula based on SCr of 0.89 mg/dL).    Allergies  Allergen Reactions   Metformin And Related Diarrhea   Oxycodone Hcl Nausea And Vomiting    Can still take this for pain medicine   Other Cough   Atorvastatin Cough   Biaxin [Clarithromycin] Itching   Codeine Nausea Only and Other (See Comments)    Constipates; doesn't like the way it makes her feel    Isosorbide Itching and Cough   Losartan Itching and Cough   Zetia [Ezetimibe] Diarrhea    Antimicrobials this admission: Zosyn 10/4 x 1  Vancomcyin 10/4 >> Ceftriaxone 10/5 > Metronidazole 10/5 >  Dose adjustments this admission: None  Microbiology results: 10/3 L foot wound/surgical culture: MRSA  MRSA PCR: pending  Thank you for allowing pharmacy to be a part of this patient's care.  07/11/21, PharmD,  BCPS.   Work Cell: [phone] 05/14/2021 10:48 AM

## 2021-05-14 NOTE — Progress Notes (Signed)
[facility] Daily Progress Note  Subjective: Patient without complaint this afternoon.  No acute issues overnight.  Patient is much more calm today.  Objective: Vitals:   05/13/21 2344 05/14/21 0412 05/14/21 1125 05/14/21 1528  BP: (!) 136/53 137/66 130/63 131/68  Pulse: (!) 57 (!) 52 (!) 53 (!) 55  Resp: 18 18 18 18   Temp: 97.6 F (36.4 C) 97.8 F (36.6 C) 98.4 F (36.9 C)   TempSrc:      SpO2: 95% 97% 97% 99%  Weight:      Height:        Intake/Output Summary (Last 24 hours) at 05/14/2021 1544 Last data filed at 05/14/2021 1429 Gross per 24 hour  Intake 3483.15 ml  Output 0 ml  Net 3483.15 ml   Physical Exam: A&Ox3, NAD CV: RRR Pulmonary: CTA Bilaterally Abdomen: Soft, Nontender, Nondistended Vascular:  Left lower extremity: Thigh soft.  Calf soft.  Extremities warm.  I did not remove podiatry's operating room dressing.   Laboratory: CBC    Component Value Date/Time   WBC 8.5 05/13/2021 0316   HGB 9.7 (L) 05/13/2021 0316   HCT 28.3 (L) 05/13/2021 0316   PLT 217 05/13/2021 0316   BMET    Component Value Date/Time   NA 136 05/13/2021 0316   NA 136 09/26/2013 0928   K 3.9 05/13/2021 0316   K 4.5 09/26/2013 0928   CL 102 05/13/2021 0316   CL 104 09/26/2013 0928   CO2 26 05/13/2021 0316   CO2 31 09/26/2013 0928   GLUCOSE 198 (H) 05/13/2021 0316   GLUCOSE 145 (H) 09/26/2013 0928   BUN 30 (H) 05/13/2021 0316   BUN 19 (H) 09/26/2013 0928   CREATININE 0.89 05/13/2021 0316   CREATININE 0.85 09/26/2013 0928   CALCIUM 8.4 (L) 05/13/2021 0316   CALCIUM 9.8 09/26/2013 0928   GFRNONAA >60 05/13/2021 0316   GFRNONAA >60 09/26/2013 0928   GFRAA >60 02/27/2020 1633   GFRAA >60 09/26/2013 0928   Assessment/Planning: The patient is a 70 year old female with a known history of atherosclerotic disease to the lower extremities status post endovascular/open repair who presents with nonhealing ulcerations to the left foot  1) Patient was taken to  the operating room yesterday with podiatry.  Amputation of the fourth toe as well as debridement of the ulcer at the bottom of her foot. 2) We are planning on moving forward with a left lower extremity angiogram tomorrow with Dr. 78.   3) The patient is aware that Dr. Wyn Quaker will be performing the angiogram as Dr. Wyn Quaker will not be in the hospital tomorrow.  The patient and her husband at the bedside expressed their understanding and consents to moving forward.  Procedure, risks and benefits were explained to the patient.  All questions were answered.  Patient wished to proceed.  We will plan on this tomorrow with Dr. [name].  Wyn Quaker PA-C 05/14/2021 3:44 PM

## 2021-05-14 NOTE — Progress Notes (Signed)
[facility]  - [place] [facility]   PATIENT NAME: [name]    MR#:  [id number]  DATE OF BIRTH:  01/06/1951  SUBJECTIVE:   patient much calm and doing well. She is grateful surgery got over well. REVIEW OF SYSTEMS:   Review of Systems  Constitutional:  Positive for malaise/fatigue. Negative for chills, fever and weight loss.  HENT:  Negative for ear discharge, ear pain and nosebleeds.   Eyes:  Negative for blurred vision, pain and discharge.  Respiratory:  Negative for sputum production, shortness of breath, wheezing and stridor.   Cardiovascular:  Negative for chest pain, palpitations, orthopnea and PND.  Gastrointestinal:  Negative for abdominal pain, diarrhea, nausea and vomiting.  Genitourinary:  Negative for frequency and urgency.  Musculoskeletal:  Negative for back pain and joint pain.  Neurological:  Negative for sensory change, speech change, focal weakness and weakness.  Psychiatric/Behavioral:  Negative for depression and hallucinations. The patient is nervous/anxious.   Tolerating Diet:yes Tolerating PT: pending  DRUG ALLERGIES:   Allergies  Allergen Reactions   Metformin And Related Diarrhea   Oxycodone Hcl Nausea And Vomiting    Can still take this for pain medicine   Other Cough   Atorvastatin Cough   Biaxin [Clarithromycin] Itching   Codeine Nausea Only and Other (See Comments)    Constipates; doesn't like the way it makes her feel    Isosorbide Itching and Cough   Losartan Itching and Cough   Zetia [Ezetimibe] Diarrhea    VITALS:  Blood pressure 137/66, pulse (!) 52, temperature 97.8 F (36.6 C), resp. rate 18, height 5\' 8"  (1.727 m), weight 77.6 kg, SpO2 97 %.  PHYSICAL EXAMINATION:   Physical Exam  GENERAL:  70 y.o.-year-old patient lying in the bed with no acute distress.  LUNGS: Normal breath sounds bilaterally, no wheezing, rales, rhonchi. No use of accessory muscles of respiration.  CARDIOVASCULAR: S1, S2 normal. No  murmurs, rubs, or gallops.  ABDOMEN: Soft, nontender, nondistended. Bowel sounds present. No organomegaly or mass.  EXTREMITIES:  on admission  NEUROLOGIC:non focal   PSYCHIATRIC:  patient is alert and oriented x 3.  SKIN: as above  LABORATORY PANEL:  CBC Recent Labs  Lab 05/13/21 0316  WBC 8.5  HGB 9.7*  HCT 28.3*  PLT 217     Chemistries  Recent Labs  Lab 05/11/21 1328 05/12/21 0400 05/13/21 0316  NA 132*   < > 136  K 3.8   < > 3.9  CL 97*   < > 102  CO2 25   < > 26  GLUCOSE 292*   < > 198*  BUN 25*   < > 30*  CREATININE 0.98   < > 0.89  CALCIUM 8.6*   < > 8.4*  AST 19  --   --   ALT 20  --   --   ALKPHOS 109  --   --   BILITOT 1.0  --   --    < > = values in this interval not displayed.    Cardiac Enzymes No results for input(s): TROPONINI in the last 168 hours. RADIOLOGY:  DG Foot 2 Views Left  Result Date: 05/13/2021 CLINICAL DATA:  Postop EXAM: LEFT FOOT - 2 VIEW COMPARISON:  05/11/2021 FINDINGS: Interval transmetatarsal amputation of the fourth digit with placement of multiple antibiotic beads. Previous partial resection of the fifth digit. Cut edge is are smooth. Navicular fracture by MRI is noted. IMPRESSION: Interval transmetatarsal amputation of fourth digit. Irregularity  of the navicular presumably corresponds to MRI demonstrated subacute fracture Electronically Signed   By: [name] M.D.   On: 05/13/2021 19:01   ASSESSMENT AND PLAN:  [name] is a 70 y.o. female with medical history significant for depression, coronary artery disease status post CABG, diabetes mellitus, hypertension, peripheral arterial disease who was sent to the emergency room by her podiatrist after she had gone to see him for evaluation of a 2-day history of increasing pain, swelling over the dorsum of her left foot as well as discoloration of the medial portion of the fourth toe with purulent drainage.  Left Diabetic foot infection -MRI confirm osteomyelitis of 4th  digit on left foot with fluid/abscess -podiatry following and plans are for amputation on 10/5 per Dr Excell Seltzer -cultures have been sent -currently on ceftriaxone, flagyl and vancomycin--will narrow abxs after surgery --10/6--POD#1 S/p left 4th Ray amputation.   Peripheral arterial diease, severe ,bilateral with angioplasty and stent placements in the past -vascular surgery following--plans for angiogram later this week -plan for arteriogram and possible revascularization of left foot later this week   Diabetes type 2 with severe PAD -glipizide on hold -continue on SSI -blood sugars have been fluctuating --start Semglee and Aspart TID   Nutrition Status: Nutrition Problem: Moderate Malnutrition Etiology: acute illness Signs/Symptoms: mild muscle depletion, mild fat depletion, moderate fat depletion Interventions: Refer to RD note for recommendations     DVT prophylaxis: enoxaparin (LOVENOX) injection 40 mg Start: 05/11/21 1500  Code Status: full code Family Communication: none today Disposition Plan: Status is: Inpatient   Remains inpatient appropriate because:Hemodynamically unstable, IV treatments appropriate due to intensity of illness or inability to take PO, and Inpatient level of care appropriate due to severity of illness   Dispo: The patient is from: Home              Anticipated d/c is to: Home              Patient currently is not medically stable to d/c.              Difficult to place patient No       TOTAL TIME TAKING CARE OF THIS PATIENT: 25 minutes.  >50% time spent on counselling and coordination of care  Note: This dictation was prepared with Dragon dictation along with smaller phrase technology. Any transcriptional errors that result from this process are unintentional.  [name] M.D    [facility]   CC: Primary care physician; Dominique Arbour, MD Patient ID: [name], female   DOB: 09-27-1950, 70 y.o.   MRN: [id number]

## 2021-05-14 NOTE — Progress Notes (Addendum)
Inpatient Diabetes Program Recommendations  AACE/ADA: New Consensus Statement on Inpatient Glycemic Control  Target Ranges:  Prepandial:   less than 140 mg/dL      Peak postprandial:   less than 180 mg/dL (1-2 hours)      Critically ill patients:  140 - 180 mg/dL    RevieResults for [name], [name] (MRN [id number]) as of 05/14/2021 08:43  Ref. Range 05/13/2021 08:16 05/13/2021 12:26 05/13/2021 16:10 05/13/2021 18:18 05/13/2021 23:47 05/14/2021 07:36  Glucose-Capillary Latest Ref Range: 70 - 99 mg/dL 856 (H) 314 (H) 970 (H) 105 (H) 249 (H) 244 (H)  w of Glycemic Control  Diabetes history: DM2 Outpatient Diabetes medications: Glipizide XL 10 mg daily, Trulicity 1.5 mg Qweek (Thursday), Cinnamon 1000 mg TID with meals Current orders for Inpatient glycemic control: Novolog 0-15 units TID with meals   Inpatient Diabetes Program Recommendations:     Insulin: Patient received Decadron 5 mg x1 on 05/13/21 which is contributing to hyperglycemia.  Please consider ordering Novolog 0-5 units QHS and ordering Novolog 3 units TID with meals for meal coverage if patient eats at least 50% of meals.   NOTE: Spoke with Dr. Allena Katz over the phone and she would like to start patient on long acting insulin as well. Recommended Semglee 5 units daily (along with Novolog 3 units TID for meal coverage).  Thanks, Orlando Penner, RN, MSN, CDE Diabetes Coordinator Inpatient Diabetes Program [phone] (Team Pager from 8am to 5pm)
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
## 2021-05-15 ENCOUNTER — Encounter: Payer: Self-pay | Admitting: Anesthesiology

## 2021-05-15 ENCOUNTER — Encounter
Admission: EM | Disposition: A | Discharge status: Home Health | Payer: Self-pay | Source: Home / Self Care | Attending: Internal Medicine

## 2021-05-15 ENCOUNTER — Inpatient Hospital Stay: Payer: Medicare Other | Admitting: Anesthesiology

## 2021-05-15 ENCOUNTER — Encounter: Payer: Self-pay | Admitting: Internal Medicine

## 2021-05-15 DIAGNOSIS — M86672 Other chronic osteomyelitis, left ankle and foot: Secondary | ICD-10-CM

## 2021-05-15 DIAGNOSIS — I70249 Atherosclerosis of native arteries of left leg with ulceration of unspecified site: Secondary | ICD-10-CM

## 2021-05-15 HISTORY — PX: LOWER EXTREMITY ANGIOGRAPHY: CATH118251

## 2021-05-15 LAB — GLUCOSE, CAPILLARY
Glucose-Capillary: 129 mg/dL — ABNORMAL HIGH (ref 70–99)
Glucose-Capillary: 112 mg/dL — ABNORMAL HIGH (ref 70–99)
Glucose-Capillary: 79 mg/dL (ref 70–99)
Glucose-Capillary: 140 mg/dL — ABNORMAL HIGH (ref 70–99)
Glucose-Capillary: 348 mg/dL — ABNORMAL HIGH (ref 70–99)

## 2021-05-15 LAB — CBC
HCT: 28.6 % — ABNORMAL LOW (ref 36.0–46.0)
Hemoglobin: 9.7 g/dL — ABNORMAL LOW (ref 12.0–15.0)
MCH: 29.8 pg (ref 26.0–34.0)
MCHC: 33.9 g/dL (ref 30.0–36.0)
MCV: 88 fL (ref 80.0–100.0)
Platelets: 288 10*3/uL (ref 150–400)
RBC: 3.25 MIL/uL — ABNORMAL LOW (ref 3.87–5.11)
RDW: 13.6 % (ref 11.5–15.5)
WBC: 9 10*3/uL (ref 4.0–10.5)
nRBC: 0 % (ref 0.0–0.2)

## 2021-05-15 LAB — VANCOMYCIN, TROUGH: Vancomycin Tr: 21 ug/mL (ref 15–20)

## 2021-05-15 LAB — BASIC METABOLIC PANEL
Anion gap: 7 (ref 5–15)
BUN: 49 mg/dL — ABNORMAL HIGH (ref 8–23)
CO2: 25 mmol/L (ref 22–32)
Calcium: 8.3 mg/dL — ABNORMAL LOW (ref 8.9–10.3)
Chloride: 102 mmol/L (ref 98–111)
Creatinine, Ser: 1.06 mg/dL — ABNORMAL HIGH (ref 0.44–1.00)
GFR, Estimated: 57 mL/min — ABNORMAL LOW (ref >60)
Glucose, Bld: 135 mg/dL — ABNORMAL HIGH (ref 70–99)
Potassium: 4 mmol/L (ref 3.5–5.1)
Sodium: 134 mmol/L — ABNORMAL LOW (ref 135–145)

## 2021-05-15 LAB — MAGNESIUM: Magnesium: 2.2 mg/dL (ref 1.7–2.4)

## 2021-05-15 LAB — SURGICAL PATHOLOGY

## 2021-05-15 SURGERY — LOWER EXTREMITY ANGIOGRAPHY
Anesthesia: General | Laterality: Left

## 2021-05-15 MED ORDER — LIDOCAINE HCL (CARDIAC) PF 100 MG/5ML IV SOSY
PREFILLED_SYRINGE | INTRAVENOUS | Status: DC | PRN
  Administered 2021-05-15: 100 mg via INTRAVENOUS

## 2021-05-15 MED ORDER — MIDAZOLAM HCL 2 MG/2ML IJ SOLN
INTRAMUSCULAR | Status: DC | PRN
  Administered 2021-05-15: 2 mg via INTRAVENOUS

## 2021-05-15 MED ORDER — ONDANSETRON HCL 4 MG/2ML IJ SOLN
INTRAMUSCULAR | Status: DC | PRN
  Administered 2021-05-15: 4 mg via INTRAVENOUS

## 2021-05-15 MED ORDER — INSULIN ASPART 100 UNIT/ML IJ SOLN
5.0000 [IU] | Freq: Three times a day (TID) | INTRAMUSCULAR | Status: DC
  Administered 2021-05-16: 5 [IU] via SUBCUTANEOUS
  Filled 2021-05-15 (×2): qty 1

## 2021-05-15 MED ORDER — FAMOTIDINE 20 MG PO TABS: 40.0000 mg | ORAL_TABLET | Freq: Once | ORAL | Status: DC | PRN

## 2021-05-15 MED ORDER — FENTANYL CITRATE (PF) 100 MCG/2ML IJ SOLN: 25.0000 ug | INTRAMUSCULAR | Status: DC | PRN

## 2021-05-15 MED ORDER — DEXAMETHASONE SODIUM PHOSPHATE 10 MG/ML IJ SOLN
INTRAMUSCULAR | Status: DC | PRN
  Administered 2021-05-15: 5 mg via INTRAVENOUS

## 2021-05-15 MED ORDER — HYDROMORPHONE HCL 1 MG/ML IJ SOLN: 1.0000 mg | Freq: Once | INTRAMUSCULAR | Status: DC | PRN

## 2021-05-15 MED ORDER — FENTANYL CITRATE (PF) 100 MCG/2ML IJ SOLN
INTRAMUSCULAR | Status: AC
  Filled 2021-05-15: qty 2

## 2021-05-15 MED ORDER — VANCOMYCIN HCL 1250 MG/250ML IV SOLN
1250.0000 mg | INTRAVENOUS | Status: DC
  Administered 2021-05-16: 1250 mg via INTRAVENOUS
  Filled 2021-05-15: qty 250

## 2021-05-15 MED ORDER — PROPOFOL 10 MG/ML IV BOLUS
INTRAVENOUS | Status: AC
  Filled 2021-05-15: qty 20

## 2021-05-15 MED ORDER — INSULIN ASPART 100 UNIT/ML IJ SOLN
10.0000 [IU] | Freq: Once | INTRAMUSCULAR | Status: AC
  Administered 2021-05-15: 10 [IU] via SUBCUTANEOUS
  Filled 2021-05-15: qty 1

## 2021-05-15 MED ORDER — IODIXANOL 320 MG/ML IV SOLN
INTRAVENOUS | Status: DC | PRN
  Administered 2021-05-15: 60 mL via INTRA_ARTERIAL

## 2021-05-15 MED ORDER — ONDANSETRON HCL 4 MG/2ML IJ SOLN: 4.0000 mg | Freq: Four times a day (QID) | INTRAMUSCULAR | Status: DC | PRN

## 2021-05-15 MED ORDER — SENNOSIDES-DOCUSATE SODIUM 8.6-50 MG PO TABS
1.0000 | ORAL_TABLET | Freq: Two times a day (BID) | ORAL | Status: DC
  Administered 2021-05-15 – 2021-05-16 (×2): 1 via ORAL
  Filled 2021-05-15 (×2): qty 1

## 2021-05-15 MED ORDER — INSULIN GLARGINE-YFGN 100 UNIT/ML ~~LOC~~ SOLN
8.0000 [IU] | Freq: Every day | SUBCUTANEOUS | Status: DC
  Filled 2021-05-15 (×2): qty 0.08

## 2021-05-15 MED ORDER — HEPARIN SODIUM (PORCINE) 1000 UNIT/ML IJ SOLN
INTRAMUSCULAR | Status: DC | PRN
  Administered 2021-05-15: 5000 [IU] via INTRAVENOUS

## 2021-05-15 MED ORDER — NYSTATIN 100000 UNIT/GM EX CREA
1.0000 "application " | TOPICAL_CREAM | Freq: Three times a day (TID) | CUTANEOUS | Status: DC
  Administered 2021-05-15 – 2021-05-16 (×3): 1 via TOPICAL
  Filled 2021-05-15: qty 15

## 2021-05-15 MED ORDER — DIPHENHYDRAMINE HCL 50 MG/ML IJ SOLN: 50.0000 mg | Freq: Once | INTRAMUSCULAR | Status: DC | PRN

## 2021-05-15 MED ORDER — METHYLPREDNISOLONE SODIUM SUCC 125 MG IJ SOLR: 125.0000 mg | Freq: Once | INTRAMUSCULAR | Status: DC | PRN

## 2021-05-15 MED ORDER — MIDAZOLAM HCL 2 MG/2ML IJ SOLN
INTRAMUSCULAR | Status: AC
  Filled 2021-05-15: qty 2

## 2021-05-15 MED ORDER — PROPOFOL 10 MG/ML IV BOLUS
INTRAVENOUS | Status: DC | PRN
  Administered 2021-05-15: 120 mg via INTRAVENOUS

## 2021-05-15 MED ORDER — MIDAZOLAM HCL 2 MG/ML PO SYRP: 8.0000 mg | ORAL_SOLUTION | Freq: Once | ORAL | Status: DC | PRN

## 2021-05-15 MED ORDER — POLYETHYLENE GLYCOL 3350 17 G PO PACK
17.0000 g | PACK | Freq: Every day | ORAL | Status: DC
  Administered 2021-05-16: 17 g via ORAL
  Filled 2021-05-15: qty 1

## 2021-05-15 MED ORDER — DEXAMETHASONE SODIUM PHOSPHATE 10 MG/ML IJ SOLN: INTRAMUSCULAR | Status: DC | PRN

## 2021-05-15 MED ORDER — GLYCOPYRROLATE 0.2 MG/ML IJ SOLN
INTRAMUSCULAR | Status: DC | PRN
  Administered 2021-05-15 (×2): .2 mg via INTRAVENOUS

## 2021-05-15 MED ORDER — SODIUM CHLORIDE 0.9 % IV SOLN: INTRAVENOUS | Status: DC

## 2021-05-15 SURGICAL SUPPLY — 28 items
APL PRP STRL LF DISP 70% ISPRP (MISCELLANEOUS) ×1
BALLN LUTONIX 018 4X80X130 (BALLOONS) ×2
BALLN ULTRVRSE 2.5X300X150 (BALLOONS) ×2
BALLN ULTRVRSE 2X220X150 (BALLOONS) ×2
BALLN ULTRVRSE 3X150X150 (BALLOONS) ×2
BALLOON LUTONIX 018 4X80X130 (BALLOONS) IMPLANT
BALLOON ULTRVRSE 2.5X300X150 (BALLOONS) IMPLANT
BALLOON ULTRVRSE 2X220X150 (BALLOONS) IMPLANT
BALLOON ULTRVRSE 3X150X150 (BALLOONS) IMPLANT
CATH 0.018 NAVICROSS ST 150 (CATHETERS) ×1 IMPLANT
CATH ANGIO 5F PIGTAIL 65CM (CATHETERS) ×1 IMPLANT
CATH BEACON 5 .038 100 VERT TP (CATHETERS) ×1 IMPLANT
CATH NAVICROSS ANGLED 135CM (MICROCATHETER) ×1 IMPLANT
CATH POWERPICC DL 5F 135CM GW (CATHETERS) ×1 IMPLANT
CHLORAPREP W/TINT 26 (MISCELLANEOUS) ×1 IMPLANT
COVER PROBE U/S 5X48 (MISCELLANEOUS) ×2 IMPLANT
DEVICE STARCLOSE SE CLOSURE (Vascular Products) ×1 IMPLANT
DRAPE BRACHIAL (DRAPES) ×1 IMPLANT
GLIDEWIRE ADV .035X260CM (WIRE) ×1 IMPLANT
GUIDEWIRE PFTE-COATED .018X300 (WIRE) ×1 IMPLANT
KIT ENCORE 26 ADVANTAGE (KITS) ×1 IMPLANT
PACK ANGIOGRAPHY (CUSTOM PROCEDURE TRAY) ×2 IMPLANT
SHEATH ANL2 6FRX45 HC (SHEATH) ×1 IMPLANT
SHEATH BRITE TIP 5FRX11 (SHEATH) ×1 IMPLANT
SYR MEDRAD MARK 7 150ML (SYRINGE) ×1 IMPLANT
TUBING CONTRAST HIGH PRESS 72 (TUBING) ×1 IMPLANT
WIRE G V18X300CM (WIRE) ×1 IMPLANT
WIRE GUIDERIGHT .035X150 (WIRE) ×1 IMPLANT

## 2021-05-15 NOTE — Progress Notes (Signed)
ID PROGRESS NOTE  70yo F with history of CAD s/p CABG, vascular disease, DM, HTN, PAD who noticed worsening pain, swelling, purulent draining from left foot (4th toe with purulent drainage) after stubbing foot. She was admitted on 10/3. Wound cultures grew MRSA, MSSE. She underwent left partial 4th ray amputation. With skin flap closure - on 10/5. OR note suggests not clean margins. OR cultures growing staph aureus (sensitivities pending). On 10/7, underwent vascular study.  Imaging on 10/3 mri showed: IMPRESSION: 1. Deep ulceration underlying the fourth metatarsal head with contiguous fluid collection extending to the fourth MTP joint space measuring up to 2.2 cm. Findings are compatible with septic arthritis of the fourth MTP joint. Acute osteomyelitis of the fourth toe proximal phalanx and fourth metatarsal head. 2. Small lobulated fluid collection tracks within the superficial soft tissues underlying the plantar fascia at the level of the mid forefoot measuring 2.7 x 0.5 x 1.1 cm. This appears contiguous with the overlying tenosynovial fluid collection related to the fourth toe flexor tendon. 3. Findings of reactive osteitis of the third toe proximal phalanx. 4. Subchondral fracture of the navicular bone, likely subacute. 5. Postsurgical changes of prior partial fifth ray resection without evidence of osteomyelitis of the residual fifth toe or fifth metatarsal.  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 And then will likely place on oral abtx thereafter. - OPAT orders in place, picc line weekly care and vanco trough weekly, bmp twice a week while on abtx - will have the patient follow up in the ID clinic in 1-2 wk  Fallou Hulbert B. Drue Second MD MPH [facility] [phone]

## 2021-05-15 NOTE — Op Note (Addendum)
[facility]   OPERATIVE NOTE    PRE-OPERATIVE DIAGNOSIS: Osteomyelitis left foot  POST-OPERATIVE DIAGNOSIS: Same  PROCEDURE: 1.  Ultrasound guidance for vascular access to right basilic vein Fluoroscopic guidance for placement of catheter Insertion of peripherally inserted central venous catheter to the right basilic vein  SURGEON: Festus Barren, MD  ANESTHESIA: general  ESTIMATED BLOOD LOSS: minimal  FLUORO TIME: 0.3 minutes  FINDING(S): 1.  none  SPECIMEN(S):  none  INDICATIONS:   [name] is a 70 y.o. female who presents with osteomyelitis of the left foot. We are asked to place a PICC line for long term IV access.  Risks and benefits are discussed, and informed consent was obtained.  DESCRIPTION: After obtaining full informed written consent, the patient was brought back to the vascular suite.  The patient's right arm was sterilely prepped and draped, and a sterile surgical field was created. The right basilic vein was accessed under direct ultrasound guidance without difficulty with a micropuncture needle and a permanent image was recorded. A 0.018 wire was then placed into the superior vena cava. Peel-away sheath was placed over the wire. A peripherally inserted central venous catheter was then placed over the wire and the wire and peel-away sheath were removed. The catheter tip was placed into the superior vena cava and was secured at the skin at 43 cm with a sterile dressing. The catheter withdrew blood well and flushed easily with heparinized saline. The patient tolerated the procedure well without complications.  COMPLICATIONS: none  CONDITION: stable  Festus Barren  05/15/2021, 1:22 PM  This note was created with Dragon Medical transcription system. Any errors in dictation are purely unintentional.

## 2021-05-15 NOTE — Progress Notes (Signed)
PT Cancellation Note  Patient Details Name: [name] MRN: [id number] DOB: 17-Jan-1951   Cancelled Treatment:    Reason Eval/Treat Not Completed: Other (comment).  Chart reviewed.  Nurse cleared pt for therapy participation (nurse was in pt's room when therapist called nurse and discussed trial of knee scooter).  After nurse left pt's room, nurse reporting to therapist that pt declining physical therapy at this time.  Plan for procedure later this morning.  Will re-attempt PT session at a later date/time.    Hendricks Limes, PT 05/15/21, 9:27 AM

## 2021-05-15 NOTE — Progress Notes (Addendum)
[facility]  -  at [facility]   PATIENT NAME: [name]    MR#:  [id number]  DATE OF BIRTH:  01-12-1951  SUBJECTIVE:   C/o vaginal discharge. Pt requested foley be placed for duration of angiogram to NIght MD.  For angiogram today REVIEW OF SYSTEMS:   Review of Systems  Constitutional:  Positive for malaise/fatigue. Negative for chills, fever and weight loss.  HENT:  Negative for ear discharge, ear pain and nosebleeds.   Eyes:  Negative for blurred vision, pain and discharge.  Respiratory:  Negative for sputum production, shortness of breath, wheezing and stridor.   Cardiovascular:  Negative for chest pain, palpitations, orthopnea and PND.  Gastrointestinal:  Negative for abdominal pain, diarrhea, nausea and vomiting.  Genitourinary:  Negative for frequency and urgency.  Musculoskeletal:  Negative for back pain and joint pain.  Neurological:  Negative for sensory change, speech change, focal weakness and weakness.  Psychiatric/Behavioral:  Negative for depression and hallucinations. The patient is nervous/anxious.   Tolerating Diet:yes Tolerating YQ:MVHQ  DRUG ALLERGIES:   Allergies  Allergen Reactions   Metformin And Related Diarrhea   Oxycodone Hcl Nausea And Vomiting    Can still take this for pain medicine   Other Cough   Atorvastatin Cough   Biaxin [Clarithromycin] Itching   Codeine Nausea Only and Other (See Comments)    Constipates; doesn't like the way it makes her feel    Isosorbide Itching and Cough   Losartan Itching and Cough   Zetia [Ezetimibe] Diarrhea    VITALS:  Blood pressure (!) 144/56, pulse (!) 54, temperature 97.7 F (36.5 C), temperature source Oral, resp. rate 16, height 5\' 8"  (1.727 m), weight 77.6 kg, SpO2 97 %.  PHYSICAL EXAMINATION:   Physical Exam  GENERAL:  70 y.o.-year-old patient lying in the bed with no acute distress.  LUNGS: Normal breath sounds bilaterally, no wheezing, rales, rhonchi. No use of  accessory muscles of respiration.  CARDIOVASCULAR: S1, S2 normal. No murmurs, rubs, or gallops.  ABDOMEN: Soft, nontender, nondistended. Bowel sounds present. No organomegaly or mass.  EXTREMITIES:  on admission Post-op day 1      NEUROLOGIC:non focal   PSYCHIATRIC:  patient is alert and oriented x 3. anxious SKIN: as above  LABORATORY PANEL:  CBC Recent Labs  Lab 05/15/21 0440  WBC 9.0  HGB 9.7*  HCT 28.6*  PLT 288     Chemistries  Recent Labs  Lab 05/11/21 1328 05/12/21 0400 05/15/21 0440  NA 132*   < > 134*  K 3.8   < > 4.0  CL 97*   < > 102  CO2 25   < > 25  GLUCOSE 292*   < > 135*  BUN 25*   < > 49*  CREATININE 0.98   < > 1.06*  CALCIUM 8.6*   < > 8.3*  MG  --   --  2.2  AST 19  --   --   ALT 20  --   --   ALKPHOS 109  --   --   BILITOT 1.0  --   --    < > = values in this interval not displayed.    Cardiac Enzymes No results for input(s): TROPONINI in the last 168 hours. RADIOLOGY:  DG Foot 2 Views Left  Result Date: 05/13/2021 CLINICAL DATA:  Postop EXAM: LEFT FOOT - 2 VIEW COMPARISON:  05/11/2021 FINDINGS: Interval transmetatarsal amputation of the fourth digit with placement of multiple antibiotic beads. Previous  partial resection of the fifth digit. Cut edge is are smooth. Navicular fracture by MRI is noted. IMPRESSION: Interval transmetatarsal amputation of fourth digit. Irregularity of the navicular presumably corresponds to MRI demonstrated subacute fracture Electronically Signed   By: Jasmine Pang M.D.   On: 05/13/2021 19:01   ASSESSMENT AND PLAN:  [name] is a 70 y.o. female with medical history significant for depression, coronary artery disease status post CABG, diabetes mellitus, hypertension, peripheral arterial disease who was sent to the emergency room by her podiatrist after she had gone to see him for evaluation of a 2-day history of increasing pain, swelling over the dorsum of her left foot as well as discoloration of the medial  portion of the fourth toe with purulent drainage.  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 --10/7 --POD #2...deep surgical wound MRSA. ID consult for abxs Addendum: d/w Dr Drue Second ID--recommends IV vancomycin for 2 weeks to begin with, f/u Dr Rivka Safer as outpt. Pt will need PICC line to be placed.    Peripheral arterial diease, severe ,bilateral with angioplasty and stent placements in the past -vascular surgery following -plan for arteriogram today   Diabetes type 2 with severe PAD -glipizide on hold -continue on SSI -blood sugars have been fluctuating --start Semglee and Aspart TID   Nutrition Status: Nutrition Problem: Moderate Malnutrition Etiology: acute illness Signs/Symptoms: mild muscle depletion, mild fat depletion, moderate fat depletion Interventions: Refer to RD note for recommendations  Vaginal discharge/vaginitis --po diflucan 200 mg x1 --nystatin cream tid  Recommended Foley to be discontinued after the procedure. Pt wants to have Vascular surgery give orders for d/c foley  PT recommends HHPT     DVT prophylaxis: enoxaparin (LOVENOX) injection 40 mg Start: 05/11/21 1500  Code Status: full code Family Communication: none today Disposition Plan: Status is: Inpatient   Remains inpatient appropriate because:Hemodynamically unstable, IV treatments appropriate due to intensity of illness or inability to take PO, and Inpatient level of care appropriate due to severity of illness   Dispo: The patient is from: Home              Anticipated d/c is to: Home              Patient currently is not medically stable to d/c.              Difficult to place patient No       TOTAL TIME TAKING  CARE OF THIS PATIENT: 25 minutes.  >50% time spent on counselling and coordination of care  Note: This dictation was prepared with Dragon dictation along with smaller phrase technology. Any transcriptional errors that result from this process are unintentional.  Enedina Finner M.D    [facility]   CC: Primary care physician; Marguarite Arbour, MD Patient ID: [name], female   DOB: Dec 08, 1950, 70 y.o.   MRN: [id number]
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
## 2021-05-15 NOTE — Op Note (Addendum)
[facility]  Percutaneous Study/Intervention Procedural Note   Date of Surgery: 05/11/2021 - 05/15/2021  Surgeon(s):Tamekia Rotter    Assistants:none  Pre-operative Diagnosis: PAD with ulceration and infection left foot  Post-operative diagnosis:  Same  Procedure(s) Performed:             1.  Ultrasound guidance for vascular access right femoral artery             2.  Catheter placement into left superficial femoral artery from right femoral approach             3.  Aortogram and selective left lower extremity angiogram             4.  Percutaneous transluminal angioplasty of left peroneal artery with 3 mm diameter angioplasty balloon             5.  Percutaneous transluminal angioplasty of left popliteal artery with 4 mm diam Lutonix drug-coated angioplasty balloon  6.  Percutaneous transluminal angioplasty of left posterior tibial artery all the way into the foot with 2 mm diameter angioplasty balloon distally and 2-1/2 mm diameter angioplasty balloon in the proximal and mid segments             7.  StarClose closure device right femoral artery  EBL: 5 cc  Contrast: 60 cc  Fluoro Time: 6.3 minutes  Anesthesia: General              Indications:  Patient is a 70 y.o.female with a known history of peripheral arterial disease and nonhealing ulcerations and infection of the left foot. The patient is brought in for angiography for further evaluation and potential treatment.  Due to the limb threatening nature of the situation, angiogram was performed for attempted limb salvage. The patient is aware that if the procedure fails, amputation would be expected.  The patient also understands that even with successful revascularization, amputation may still be required due to the severity of the situation.  Risks and benefits are discussed and informed consent is obtained.   Procedure:  The patient was identified and appropriate procedural time out was performed.  The patient  was then placed supine on the table and prepped and draped in the usual sterile fashion. Ultrasound was used to evaluate the right common femoral artery.  It was patent .  A digital ultrasound image was acquired.  A Seldinger needle was used to access the right common femoral artery under direct ultrasound guidance and a permanent image was performed.  A 0.035 J wire was advanced without resistance and a 5Fr sheath was placed.  Pigtail catheter was placed into the aorta and an AP aortogram was performed. This demonstrated normal renal arteries and normal aorta and iliac segments without significant stenosis. I then crossed the aortic bifurcation and advanced to the left femoral head and then into the proximal left SFA. Selective left lower extremity angiogram was then performed. This demonstrated normal common femoral artery and superficial femoral artery.  The profunda femoris artery was fairly disease particularly in the primary and secondary branches.  The popliteal artery was fairly normal to the below-knee popliteal artery where there was a napkin ringlike lesion of greater than 90% above the takeoff of the anterior tibial artery which was occluded just beyond its origin and did not reconstitute distally.  The peroneal artery was the primary runoff distally and was actually fairly robust but it did have about a 70% stenosis in its proximal segment.  The posterior tibial  artery had a proximal stump and then reconstituted at the ankle but had a long segment occlusion. It was felt that it was in the patient's best interest to proceed with intervention after these images to avoid a second procedure and a larger amount of contrast and fluoroscopy based off of the findings from the initial angiogram. The patient was systemically heparinized and a 6 [place] Ansell sheath was then placed over the [employer] wire. I then used a Kumpe catheter and the advantage wire to navigate through the popliteal lesion and into  the tibioperoneal trunk.  I then exchanged for a V 18 wire.  I then treated the peroneal artery with a 3 mm diameter by 15 cm length angioplasty balloon inflated to 10 atm for 1 minute.  A 4 mm diameter by 8 cm length Lutonix drug-coated angioplasty balloon was used to treat the popliteal artery.  This inflated to 8 atm for 1 minute.  Completion imaging following this showed only about a 20% residual stenosis in the popliteal artery and a 15 to 20% residual stenosis peroneal artery.  I then turned my attention to the posterior tibial artery.  Using a Ferd Hibbs cross catheter and a 0.018 advantage wire was able to get into the posterior tibial artery and tediously cross the occlusion having to exchanged for a 0.018 Nava cross catheter to get all the way into the foot and confirm intraluminal flow.  0.018 advantage wire was then replaced and treatment was performed.  A 2 mm diameter by 22 cm length angioplasty balloon was inflated from midfoot to the distal posterior tibial artery and inflated to 10 atm for 1 minute.  A 2.5 mm diameter by 30 cm length angioplasty balloon was inflated throughout the tibioperoneal trunk, posterior tibial artery through the proximal, mid, and down to the distal segment.  This was taken to 12 atm for 1 minute.  Completion imaging now showed inline flow although there was significant spasm in the distal posterior tibial artery, this was now continuous to the foot and there did not appear to be greater than 30% residual stenosis. I elected to terminate the procedure. The sheath was removed and StarClose closure device was deployed in the right femoral artery with excellent hemostatic result. The patient was taken to the recovery room in stable condition having tolerated the procedure well.  Findings:               Aortogram: Aorta and iliac arteries were patent without significant stenosis.  The left renal stent was patent.  The right renal artery appeared patent.             Left Lower  Extremity: Normal common femoral artery and superficial femoral artery.  The profunda femoris artery was fairly disease particularly in the primary and secondary branches.  The popliteal artery was fairly normal to the below-knee popliteal artery where there was a napkin ringlike lesion of greater than 90% above the takeoff of the anterior tibial artery which was occluded just beyond its origin and did not reconstitute distally.  The peroneal artery was the primary runoff distally and was actually fairly robust but it did have about a 70% stenosis in its proximal segment.  The posterior tibial artery had a proximal stump and then reconstituted at the ankle but had a long segment occlusion.   Disposition: Patient was taken to the recovery room in stable condition having tolerated the procedure well.  Complications: None  Leotis Pain 05/15/2021 1:09 PM   This  note was created with Dragon Medical transcription system. Any errors in dictation are purely unintentional.

## 2021-05-15 NOTE — Anesthesia Postprocedure Evaluation (Signed)
Anesthesia Post Note  Patient: [name]  Procedure(s) Performed: LOWER EXTREMITY ANGIOGRAPHY (Left)  Patient location during evaluation: PACU Anesthesia Type: General Level of consciousness: awake and alert Pain management: pain level controlled Vital Signs Assessment: post-procedure vital signs reviewed and stable Respiratory status: spontaneous breathing, nonlabored ventilation, respiratory function stable and patient connected to nasal cannula oxygen Cardiovascular status: blood pressure returned to baseline and stable Postop Assessment: no apparent nausea or vomiting Anesthetic complications: no   No notable events documented.   Last Vitals:  Vitals:   05/15/21 1415 05/15/21 1439  BP:  (!) 153/62  Pulse:  61  Resp:    Temp:    SpO2: 95% 93%    Last Pain:  Vitals:   05/15/21 1413  TempSrc:   PainSc: 0-No pain                 Cleda Mccreedy Aswad Wandrey

## 2021-05-15 NOTE — Progress Notes (Signed)
PHARMACY CONSULT NOTE FOR:  OUTPATIENT  PARENTERAL ANTIBIOTIC THERAPY (OPAT)  Indication: Diabetic foot wound and osteomyelitis with MRSA Regimen: vancomycin 750mg  IV q12h End date: 05/28/2021  *Note - vancomycin trough ordered for this evening, dose may change based on this result.  Pharmacy to re-evaluate appropriateness of current OPAT order 10/8.   IV antibiotic discharge orders are pended. To discharging provider:  please sign these orders via discharge navigator,  Select New Orders & click on the button choice - Manage This Unsigned Work.     Thank you for allowing pharmacy to be a part of this patient's care.  12/8, PharmD, BCPS.   Work Cell: [phone] 05/15/2021 11:39 AM

## 2021-05-15 NOTE — Progress Notes (Deleted)
[facility]  - [place] [facility]   PATIENT NAME: [name]    MR#:  [id number]  DATE OF BIRTH:  25-Dec-1950  SUBJECTIVE:   C/o vaginal discharge. Pt requested foley be placed for duration of angiogram to NIght MD.  For angiogram today REVIEW OF SYSTEMS:   Review of Systems  Constitutional:  Positive for malaise/fatigue. Negative for chills, fever and weight loss.  HENT:  Negative for ear discharge, ear pain and nosebleeds.   Eyes:  Negative for blurred vision, pain and discharge.  Respiratory:  Negative for sputum production, shortness of breath, wheezing and stridor.   Cardiovascular:  Negative for chest pain, palpitations, orthopnea and PND.  Gastrointestinal:  Negative for abdominal pain, diarrhea, nausea and vomiting.  Genitourinary:  Negative for frequency and urgency.  Musculoskeletal:  Negative for back pain and joint pain.  Neurological:  Negative for sensory change, speech change, focal weakness and weakness.  Psychiatric/Behavioral:  Negative for depression and hallucinations. The patient is nervous/anxious.   Tolerating Diet:yes Tolerating JS:EGBT  DRUG ALLERGIES:   Allergies  Allergen Reactions   Metformin And Related Diarrhea   Oxycodone Hcl Nausea And Vomiting    Can still take this for pain medicine   Other Cough   Atorvastatin Cough   Biaxin [Clarithromycin] Itching   Codeine Nausea Only and Other (See Comments)    Constipates; doesn't like the way it makes her feel    Isosorbide Itching and Cough   Losartan Itching and Cough   Zetia [Ezetimibe] Diarrhea    VITALS:  Blood pressure (!) 145/73, pulse (!) 52, temperature 97.8 F (36.6 C), resp. rate 16, height 5\' 8"  (1.727 m), weight 77.6 kg, SpO2 94 %.  PHYSICAL EXAMINATION:   Physical Exam  GENERAL:  70 y.o.-year-old patient lying in the bed with no acute distress.  LUNGS: Normal breath sounds bilaterally, no wheezing, rales, rhonchi. No use of accessory muscles of  respiration.  CARDIOVASCULAR: S1, S2 normal. No murmurs, rubs, or gallops.  ABDOMEN: Soft, nontender, nondistended. Bowel sounds present. No organomegaly or mass.  EXTREMITIES:  on admission Post-op day 1      NEUROLOGIC:non focal   PSYCHIATRIC:  patient is alert and oriented x 3.  SKIN: as above  LABORATORY PANEL:  CBC Recent Labs  Lab 05/15/21 0440  WBC 9.0  HGB 9.7*  HCT 28.6*  PLT 288     Chemistries  Recent Labs  Lab 05/11/21 1328 05/12/21 0400 05/15/21 0440  NA 132*   < > 134*  K 3.8   < > 4.0  CL 97*   < > 102  CO2 25   < > 25  GLUCOSE 292*   < > 135*  BUN 25*   < > 49*  CREATININE 0.98   < > 1.06*  CALCIUM 8.6*   < > 8.3*  MG  --   --  2.2  AST 19  --   --   ALT 20  --   --   ALKPHOS 109  --   --   BILITOT 1.0  --   --    < > = values in this interval not displayed.    Cardiac Enzymes No results for input(s): TROPONINI in the last 168 hours. RADIOLOGY:  DG Foot 2 Views Left  Result Date: 05/13/2021 CLINICAL DATA:  Postop EXAM: LEFT FOOT - 2 VIEW COMPARISON:  05/11/2021 FINDINGS: Interval transmetatarsal amputation of the fourth digit with placement of multiple antibiotic beads. Previous partial resection of  the fifth digit. Cut edge is are smooth. Navicular fracture by MRI is noted. IMPRESSION: Interval transmetatarsal amputation of fourth digit. Irregularity of the navicular presumably corresponds to MRI demonstrated subacute fracture Electronically Signed   By: Jasmine Pang M.D.   On: 05/13/2021 19:01   ASSESSMENT AND PLAN:  [name] is a 70 y.o. female with medical history significant for depression, coronary artery disease status post CABG, diabetes mellitus, hypertension, peripheral arterial disease who was sent to the emergency room by her podiatrist after she had gone to see him for evaluation of a 2-day history of increasing pain, swelling over the dorsum of her left foot as well as discoloration of the medial portion of the fourth toe  with purulent drainage.  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 --10/7 deep surgical wound MRSA. ID consult for abxs   Peripheral arterial diease, severe ,bilateral with angioplasty and stent placements in the past -vascular surgery following--plans for angiogram later this week -plan for arteriogram today   Diabetes type 2 with severe PAD -glipizide on hold -continue on SSI -blood sugars have been fluctuating --start Semglee and Aspart TID   Nutrition Status: Nutrition Problem: Moderate Malnutrition Etiology: acute illness Signs/Symptoms: mild muscle depletion, mild fat depletion, moderate fat depletion Interventions: Refer to RD note for recommendations  PT recommends HHPT     DVT prophylaxis: enoxaparin (LOVENOX) injection 40 mg Start: 05/11/21 1500  Code Status: full code Family Communication: none today Disposition Plan: Status is: Inpatient   Remains inpatient appropriate because:Hemodynamically unstable, IV treatments appropriate due to intensity of illness or inability to take PO, and Inpatient level of care appropriate due to severity of illness   Dispo: The patient is from: Home              Anticipated d/c is to: Home              Patient currently is not medically stable to d/c.              Difficult to place patient No       TOTAL TIME TAKING CARE OF THIS PATIENT: 25 minutes.  >50% time spent on counselling and coordination of care  Note: This dictation was prepared with Dragon dictation along with smaller phrase technology. Any transcriptional errors that result from this process are unintentional.  Enedina Finner M.D    [facility]   CC: Primary care physician;  Marguarite Arbour, MD Patient ID: [name], female   DOB: [birth year], 70 y.o.   MRN: [id number]

## 2021-05-15 NOTE — TOC Progression Note (Addendum)
Transition of Care [facility]) - Progression Note    Patient Details  Name: [name] MRN: [id number] Date of Birth: 23-Nov-1950  [facility] CM/SW Contact  Caryn Section, RN Phone Number: 05/15/2021, 2:45 PM  Clinical Narrative:   [facility] home health accepts patient per [facility]  Jeri Modena aware of IV antibiotics, patient scheduled to go home tomorrow.  Patient declines PT at this time.  TOC will follow up for equipment needs/other HH specifics if recommended,  Expected Discharge Plan:  (TBD)    Expected Discharge Plan and Services Expected Discharge Plan:  (TBD)   Discharge Planning Services: CM Consult Post Acute Care Choice: Durable Medical Equipment (TBD)                             HH Arranged:  (TBD)           Social Determinants of Health (SDOH) Interventions    Readmission Risk Interventions No flowsheet data found.

## 2021-05-15 NOTE — Consult Note (Signed)
Pharmacy Antibiotic Note  [name] is a 70 y.o. female admitted on 05/11/2021 with  DFI/osteomyelitis .  She underwent surgery 10/5 with partial 4th ray amputation and drainage of abscess within flexor tendon sheath.  Pharmacy has been consulted for Vancomcyin dosing.  Today, 05/15/2021 Day #4 vancomycin Renal: SCr mildly increased this am to 1.06, previous value 0.89 Afebrile WBC WNL 10/3 L foot cx with MRSA and MSSE 10/5 OR cx 4th toe: SA - anticipate MRSA baesd on other foot culture Pathology shows inflammation at bone margins For Angiogram today Vanco trough = on 750mg  IV q12h prior to 4th dose of this regimen resulted at 21.0  Plan: Change Vancomycin dose to 1250mg  every 24 hours AUC = 579 (VT = 18.9) SCr used 1.06 Since going home on vancomycin (cant monitor AUC as outpatient), goal trough 15-20 mcg/mL  Follow renal function See OPAT orders  Height: 5\' 8"  (172.7 cm) Weight: 77.6 kg (171 lb 1.2 oz) IBW/kg (Calculated) : 63.9  Temp (24hrs), Avg:97.6 F (36.4 C), Min:97 F (36.1 C), Max:98.4 F (36.9 C)  Recent Labs  Lab 05/11/21 1328 05/11/21 1559 05/12/21 0400 05/13/21 0316 05/15/21 0440 05/15/21 2109  WBC 14.4*  --  9.6 8.5 9.0  --   CREATININE 0.98  --  0.92 0.89 1.06*  --   LATICACIDVEN 1.2 1.2  --   --   --   --   VANCOTROUGH  --   --   --   --   --  21*     Estimated Creatinine Clearance: 54.9 mL/min (A) (by C-G formula based on SCr of 1.06 mg/dL (H)).    Allergies  Allergen Reactions   Metformin And Related Diarrhea   Oxycodone Hcl Nausea And Vomiting    Can still take this for pain medicine   Other Cough   Atorvastatin Cough   Biaxin [Clarithromycin] Itching   Codeine Nausea Only and Other (See Comments)    Constipates; doesn't like the way it makes her feel    Isosorbide Itching and Cough   Losartan Itching and Cough   Zetia [Ezetimibe] Diarrhea    Antimicrobials this admission: Zosyn 10/4 x 1  Vancomcyin 10/4 >> Ceftriaxone 10/5  > Metronidazole 10/5 >  Dose adjustments this admission: None  Microbiology results: 10/3 L foot wound/surgical culture: MRSA 10/5 4th toe: SA  MRSA PCR: Neg  Thank you for allowing pharmacy to be a part of this patient's care.  Selim Durden Rodriguez-Guzman PharmD, BCPS 05/15/2021 9:51 PM

## 2021-05-15 NOTE — Transfer of Care (Signed)
Immediate Anesthesia Transfer of Care Note  Patient: [name]  Procedure(s) Performed: LOWER EXTREMITY ANGIOGRAPHY (Left)  Patient Location: PACU  Anesthesia Type:General  Level of Consciousness: awake, alert  and oriented  Airway & Oxygen Therapy: Patient Spontanous Breathing and Patient connected to face mask oxygen  Post-op Assessment: Report given to RN and Post -op Vital signs reviewed and stable  Post vital signs: Reviewed and stable  Last Vitals:  Vitals Value Taken Time  BP 158/64 05/15/21 1345  Temp    Pulse 60 05/15/21 1348  Resp 12 05/15/21 1348  SpO2 94 % 05/15/21 1348  Vitals shown include unvalidated device data.  Last Pain:  Vitals:   05/15/21 1120  TempSrc: Oral  PainSc: 0-No pain         Complications: No notable events documented.

## 2021-05-15 NOTE — Anesthesia Procedure Notes (Signed)
Procedure Name: LMA Insertion Date/Time: 05/15/2021 12:10 PM Performed by: Joanette Gula, Vanesha Athens, CRNA Pre-anesthesia Checklist: Patient identified, Emergency Drugs available, Suction available and Patient being monitored Patient Re-evaluated:Patient Re-evaluated prior to induction Oxygen Delivery Method: Circle system utilized Preoxygenation: Pre-oxygenation with 100% oxygen Induction Type: IV induction Ventilation: Mask ventilation without difficulty LMA: LMA inserted LMA Size: 4.0 Number of attempts: 1 Placement Confirmation: positive ETCO2 and breath sounds checked- equal and bilateral Tube secured with: Tape Dental Injury: Teeth and Oropharynx as per pre-operative assessment

## 2021-05-15 NOTE — Care Management Important Message (Signed)
Important Message  Patient Details  Name: [name] MRN: [id number] Date of Birth: 11-25-1950   Medicare Important Message Given:  Yes     Janeane Cozart, Stephan Minister 05/15/2021, 1:26 PM

## 2021-05-15 NOTE — Consult Note (Signed)
Pharmacy Antibiotic Note  [name] is a 70 y.o. female admitted on 05/11/2021 with  DFI/osteomyelitis .  She underwent surgery 10/5 with partial 4th ray amputation and drainage of abscess within flexor tendon sheath.  Pharmacy has been consulted for Vancomcyin dosing.  Today, 05/15/2021 Day #4 vancomycin Renal: SCr mildly increased this am to 1.06, previous value 0.89 Afebrile WBC WNL 10/3 L foot cx with MRSA and MSSE 10/5 OR cx 4th toe: SA - anticipate MRSA baesd on other foot culture Pathology shows inflammation at bone margins For Angiogram today Vanco trough = on 750mg  IV q12h prior to 4th dose of this regimen  Plan: Continue vancomycin to 750mg  IV q12h AUC = 579 (VT = 18.9) SCr used 1.06 Since going home on vancomycin (cant monitor AUC as outpatient), goal trough 15-20 mcg/mL  Based on change in SCr this am and plan for IV antibiotics as outpatient. Will check vancomycin trough tonight prior to 4th dose of current regimen  Follow renal function See OPAT orders  Height: 5\' 8"  (172.7 cm) Weight: 77.6 kg (171 lb 1.2 oz) IBW/kg (Calculated) : 63.9  Temp (24hrs), Avg:97.7 F (36.5 C), Min:97.6 F (36.4 C), Max:97.8 F (36.6 C)  Recent Labs  Lab 05/11/21 1328 05/11/21 1559 05/12/21 0400 05/13/21 0316 05/15/21 0440  WBC 14.4*  --  9.6 8.5 9.0  CREATININE 0.98  --  0.92 0.89 1.06*  LATICACIDVEN 1.2 1.2  --   --   --      Estimated Creatinine Clearance: 54.9 mL/min (A) (by C-G formula based on SCr of 1.06 mg/dL (H)).    Allergies  Allergen Reactions   Metformin And Related Diarrhea   Oxycodone Hcl Nausea And Vomiting    Can still take this for pain medicine   Other Cough   Atorvastatin Cough   Biaxin [Clarithromycin] Itching   Codeine Nausea Only and Other (See Comments)    Constipates; doesn't like the way it makes her feel    Isosorbide Itching and Cough   Losartan Itching and Cough   Zetia [Ezetimibe] Diarrhea    Antimicrobials this  admission: Zosyn 10/4 x 1  Vancomcyin 10/4 >> Ceftriaxone 10/5 > Metronidazole 10/5 >  Dose adjustments this admission: None  Microbiology results: 10/3 L foot wound/surgical culture: MRSA 10/5 4th toe: SA  MRSA PCR: Neg  Thank you for allowing pharmacy to be a part of this patient's care.  07/12/21, PharmD, BCPS.   Work Cell: [phone] 05/15/2021 11:26 AM

## 2021-05-15 NOTE — Discharge Instructions (Addendum)
Vascular Surgery Discharge Instructions:  1) Please do not engage in strenuous activity or lifting greater than 10 pounds until you are cleared at your first post procedure follow-up.   Podiatry Discharge Instructions 1.  Keep surgical dressings clean, dry, and intact to your left foot. 2.  Try to remain for the most part nonweightbearing to your left foot.  You may use your heel contact for transfers only in surgical shoe. 3.  Continue with IV antibiotics daily.  Follow-up with infectious disease for further antibiotic management. 4.  If dressings become loosened or saturated you may change the dressing but try to once again keep the dressing clean, dry, and intact for the next week.  If this does occur you may call clinic for instruction or if you have supplies then would recommend removing the dressing and applying nonadherent gauze to the incision line such as Xeroform, you may also put a little bit of Betadine paints around any macerated areas, 4 x 4 gauze, ABD pad, Kerlix/gauze roll, and Ace wrap with minimal compression. 5.  Follow-up in clinic within 1 week of discharge date, call to make appointment during normal clinic hours.   You are also being started on Plavix and low-dose Xarelto due to the stents placed in your left leg by your vascular surgery.  Please take it as directed.  They can increase her chance of bleeding, if you experience any abnormal bleeding please seek medical attention.

## 2021-05-16 LAB — CBC
HCT: 30.4 % — ABNORMAL LOW (ref 36.0–46.0)
Hemoglobin: 10.1 g/dL — ABNORMAL LOW (ref 12.0–15.0)
MCH: 29 pg (ref 26.0–34.0)
MCHC: 33.2 g/dL (ref 30.0–36.0)
MCV: 87.4 fL (ref 80.0–100.0)
Platelets: 320 10*3/uL (ref 150–400)
RBC: 3.48 MIL/uL — ABNORMAL LOW (ref 3.87–5.11)
RDW: 13.7 % (ref 11.5–15.5)
WBC: 7.3 10*3/uL (ref 4.0–10.5)
nRBC: 0 % (ref 0.0–0.2)

## 2021-05-16 LAB — GLUCOSE, CAPILLARY
Glucose-Capillary: 246 mg/dL — ABNORMAL HIGH (ref 70–99)
Glucose-Capillary: 268 mg/dL — ABNORMAL HIGH (ref 70–99)
Glucose-Capillary: 362 mg/dL — ABNORMAL HIGH (ref 70–99)

## 2021-05-16 LAB — BASIC METABOLIC PANEL
Anion gap: 7 (ref 5–15)
BUN: 42 mg/dL — ABNORMAL HIGH (ref 8–23)
CO2: 22 mmol/L (ref 22–32)
Calcium: 8.5 mg/dL — ABNORMAL LOW (ref 8.9–10.3)
Chloride: 104 mmol/L (ref 98–111)
Creatinine, Ser: 0.76 mg/dL (ref 0.44–1.00)
GFR, Estimated: >60 mL/min (ref >60)
Glucose, Bld: 332 mg/dL — ABNORMAL HIGH (ref 70–99)
Potassium: 4.5 mmol/L (ref 3.5–5.1)
Sodium: 133 mmol/L — ABNORMAL LOW (ref 135–145)

## 2021-05-16 LAB — MAGNESIUM: Magnesium: 2.2 mg/dL (ref 1.7–2.4)

## 2021-05-16 MED ORDER — CLOPIDOGREL BISULFATE 75 MG PO TABS: 75.0000 mg | ORAL_TABLET | Freq: Every day | ORAL | 11 refills | Status: AC

## 2021-05-16 MED ORDER — POLYETHYLENE GLYCOL 3350 17 G PO PACK: 17.0000 g | PACK | Freq: Every day | ORAL | 0 refills | Status: DC | PRN

## 2021-05-16 MED ORDER — SENNOSIDES-DOCUSATE SODIUM 8.6-50 MG PO TABS: 1.0000 | ORAL_TABLET | Freq: Two times a day (BID) | ORAL | 1 refills | Status: DC

## 2021-05-16 MED ORDER — INSULIN GLARGINE-YFGN 100 UNIT/ML ~~LOC~~ SOLN
8.0000 [IU] | Freq: Two times a day (BID) | SUBCUTANEOUS | Status: DC
  Administered 2021-05-16: 8 [IU] via SUBCUTANEOUS
  Filled 2021-05-16 (×3): qty 0.08

## 2021-05-16 MED ORDER — ENSURE MAX PROTEIN PO LIQD: 11.0000 [oz_av] | Freq: Two times a day (BID) | ORAL | 0 refills | Status: DC

## 2021-05-16 MED ORDER — AMLODIPINE BESYLATE 10 MG PO TABS: 10.0000 mg | ORAL_TABLET | Freq: Two times a day (BID) | ORAL | 1 refills | Status: AC

## 2021-05-16 MED ORDER — ADULT MULTIVITAMIN W/MINERALS CH
1.0000 | ORAL_TABLET | Freq: Every day | ORAL | 0 refills | Status: DC
Start: 2021-05-16 — End: 2022-04-07

## 2021-05-16 MED ORDER — TRAMADOL HCL 50 MG PO TABS: 50.0000 mg | ORAL_TABLET | Freq: Four times a day (QID) | ORAL | 0 refills | Status: DC | PRN

## 2021-05-16 MED ORDER — VANCOMYCIN IV (FOR PTA / DISCHARGE USE ONLY): 1250.0000 mg | INTRAVENOUS | 0 refills | Status: AC

## 2021-05-16 MED ORDER — NYSTATIN 100000 UNIT/GM EX CREA: 1.0000 "application " | TOPICAL_CREAM | Freq: Three times a day (TID) | CUTANEOUS | 0 refills | Status: DC

## 2021-05-16 MED ORDER — JUVEN PO PACK: 1.0000 | PACK | Freq: Two times a day (BID) | ORAL | 1 refills | Status: DC

## 2021-05-16 MED ORDER — XARELTO 2.5 MG PO TABS: 2.5000 mg | ORAL_TABLET | Freq: Two times a day (BID) | ORAL | 1 refills | Status: DC

## 2021-05-16 NOTE — Discharge Summary (Signed)
Physician Discharge Summary  [name] ZOX:[id number] DOB: [birth year] DOA: 05/11/2021  PCP: Idelle Crouch, MD  Admit date: 05/11/2021 Discharge date: 05/16/2021  Admitted From: Home Disposition: Home  Recommendations for Outpatient Follow-up:  Follow up with PCP in 1-2 weeks Follow-up with podiatry Follow-up with vascular surgery Follow-up with infectious disease Please check BMP twice weekly until she remains on vancomycin Please check CBC in one week Please follow up on the following pending results:  Home Health: Yes Equipment/Devices: PICC line for antibiotics Discharge Condition: Stable CODE STATUS: Full Diet recommendation: Heart Healthy / Carb Modified   Brief/Interim Summary: [name] is a 70 y.o. female with medical history significant for depression, coronary artery disease status post CABG, diabetes mellitus, hypertension, peripheral arterial disease who was sent to the emergency room by her podiatrist after she had gone to see him for evaluation of a 2-day history of increasing pain, swelling over the dorsum of her left foot as well as discoloration of the medial portion of the fourth toe with purulent drainage.   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  Podiatry was consulted and she was taken to the OR for amputation of fourth and a partial fifth ray on 05/13/2021.  Apparently margins were positive for infection so infectious disease was consulted.  Deep surgical wound with MRSA and they are recommending 2 weeks of IV vancomycin and a possible p.o. substitute afterwards.  PICC line was placed and she is being discharged on 12 more days of vancomycin.  She will follow-up with ID for further recommendations.  She will also need BMP twice a week while she remains on vancomycin.  Vascular surgery was also consulted by podiatry for concern of peripheral arterial disease.  She underwent aortogram and lower extremity  angiography, 2 stents were placed for significant stenosis on left lower extremity.  Patient has an history of bilateral angioplasties and stent placement in the past.  She was started on Plavix and low-dose Xarelto per vascular surgery recommendations and she will follow-up with them as an outpatient for further recommendations.  Patient will continue rest of her home medications and follow-up with her providers.  Discharge Diagnoses:  Principal Problem:   Diabetic foot infection ([place]) Active Problems:   CAD (coronary artery disease), with need for CABG, cath 01/03/15     PAD (peripheral artery disease) ([place])   Diabetes mellitus with hyperglycemia ([place])   Anxiety and depression   Malnutrition of moderate degree   Discharge Instructions  Discharge Instructions     [facility] pharmacist to adjust dose for Vancomycin, Aminoglycosides and other anti-infective therapies as requested by physician.   Complete by: As directed    [facility] pharmacist to adjust dose for Vancomycin, Aminoglycosides and other anti-infective therapies as requested by physician.   Complete by: As directed    [facility] to provide Cath Flo 35m   Complete by: As directed    Administer for PICC line occlusion and as ordered by physician for other access device issues.   [facility] to provide Cath Flo 2106m  Complete by: As directed    Administer for PICC line occlusion and as ordered by physician for other access device issues.   Anaphylaxis Kit: Provided to treat any anaphylactic reaction to the medication being provided to the patient if First Dose or when requested by physician   Complete by: As directed    Epinephrine 66m44ml vial /  amp: Administer 0.56m (0.373m subcutaneously once for moderate to severe anaphylaxis, nurse to call physician and pharmacy when reaction occurs and call 911 if needed for immediate care   Diphenhydramine 5053ml IV vial: Administer 25-39m77mV/IM PRN for first dose reaction, rash, itching, mild reaction, nurse to call physician and pharmacy when reaction occurs   Sodium Chloride 0.9% NS 500ml17m Administer if needed for hypovolemic blood pressure drop or as ordered by physician after call to physician with anaphylactic reaction   Anaphylaxis Kit: Provided to treat any anaphylactic reaction to the medication being provided to the patient if First Dose or when requested by physician   Complete by: As directed    Epinephrine 1mg/m16mial / amp: Administer 0.3mg (062ml) su45mtaneously once for moderate to severe anaphylaxis, nurse to call physician and pharmacy when reaction occurs and call 911 if needed for immediate care   Diphenhydramine 39mg/ml 67mial: Administer 25-39mg IV/I22mN for first dose reaction, rash, itching, mild reaction, nurse to call physician and pharmacy when reaction occurs   Sodium Chloride 0.9% NS 500ml IV: A46mister if needed for hypovolemic blood pressure drop or as ordered by physician after call to physician with anaphylactic reaction   Care order/instruction:   Complete by: As directed    Knee scooter for left lower extremity   Change dressing on IV access line weekly and PRN   Complete by: As directed    Change dressing on IV access line weekly and PRN   Complete by: As directed    Diet - low sodium heart healthy   Complete by: As directed    Discharge instructions   Complete by: As directed    It was pleasure taking care of you. You are being discharged on IV antibiotics for 12 more days, please follow the directions. You need to have your labs checked twice daily while you are on that medicine. Keep your self well-hydrated. You will continue with your home regimen for diabetes, it is very important that you keep your blood glucose level under good control for optimal healing, please check your blood glucose 3-4 times a day and follow-up very closely with your primary care doctor for further management  as they might need to add insulin. Follow-up with your podiatry, infectious disease and primary care physicians.   Discharge wound care:   Complete by: As directed    Keep the wound dry and change dressing as needed   Flush IV access with Sodium Chloride 0.9% and Heparin 10 units/ml or 100 units/ml   Complete by: As directed    Flush IV access with Sodium Chloride 0.9% and Heparin 10 units/ml or 100 units/ml   Complete by: As directed    Home infusion instructions - [facility]   Complete by: As directed    Instructions: Flush IV access with Sodium Chloride 0.9% and Heparin 10units/ml or 100units/ml   Change dressing on IV access line: Weekly and PRN   Instructions Cath Flo 2mg: Admini53mr for PICC Line occlusion and as ordered by physician for other access device   [facility] pharmacist to adjust dose for: Vancomycin, Aminoglycosides and other anti-infective therapies as requested by physician   Home infusion instructions - [facility]   Complete by: As directed    Instructions: Flush IV access with Sodium Chloride 0.9% and Heparin 10units/ml or 100units/ml   Change dressing on IV access line: Weekly and PRN   Instructions Cath Flo 2mg: Adminis78m for PICC Line occlusion and  as ordered by physician for other access device   [facility] pharmacist to adjust dose for: Vancomycin, Aminoglycosides and other anti-infective therapies as requested by physician   Increase activity slowly   Complete by: As directed    Method of administration may be changed at the discretion of home infusion pharmacist based upon assessment of the patient and/or caregiver's ability to self-administer the medication ordered   Complete by: As directed    Method of administration may be changed at the discretion of home infusion pharmacist based upon assessment of the patient and/or caregiver's ability to self-administer the medication ordered   Complete by: As directed        Allergies as of 05/16/2021       Reactions   Metformin And Related Diarrhea   Oxycodone Hcl Nausea And Vomiting   Can still take this for pain medicine   Other Cough   Atorvastatin Cough   Biaxin [clarithromycin] Itching   Codeine Nausea Only, Other (See Comments)   Constipates; doesn't like the way it makes her feel   Isosorbide Itching, Cough   Losartan Itching, Cough   Zetia [ezetimibe] Diarrhea        Medication List     STOP taking these medications    aspirin 81 MG EC tablet   fluconazole 150 MG tablet Commonly known as: DIFLUCAN   HYDROcodone-acetaminophen 5-325 MG tablet Commonly known as: NORCO/VICODIN   levofloxacin 500 MG tablet Commonly known as: LEVAQUIN       TAKE these medications    amLODipine 10 MG tablet Commonly known as: NORVASC Take 1 tablet (10 mg total) by mouth 2 (two) times daily. What changed:  medication strength how much to take   atenolol 100 MG tablet Commonly known as: TENORMIN Take 0.5 tablets (50 mg total) by mouth daily.   CINNAMON PO Take 1,000 mg by mouth 3 (three) times daily with meals.   clopidogrel 75 MG tablet Commonly known as: Plavix Take 1 tablet (75 mg total) by mouth daily.   Dulaglutide 1.5 MG/0.5ML Sopn Inject into the skin once a week. Takes on Thursday   furosemide 20 MG tablet Commonly known as: LASIX Take 1 tablet (20 mg total) by mouth daily as needed for fluid or edema.   glipiZIDE XL 10 MG 24 hr tablet Generic drug: glipiZIDE Take 10 mg by mouth daily with breakfast.   guaiFENesin 600 MG 12 hr tablet Commonly known as: MUCINEX Take by mouth 2 (two) times daily as needed.   hydrALAZINE 25 MG tablet Commonly known as: APRESOLINE Take 25 mg by mouth 2 (two) times daily.   multivitamin with minerals Tabs tablet Take 1 tablet by mouth daily.   nitroGLYCERIN 0.4 MG SL tablet Commonly known as: NITROSTAT Place 1 tablet (0.4 mg total) under the tongue every 5 (five) minutes as  needed for chest pain.   nutrition supplement (JUVEN) Pack Take 1 packet by mouth 2 (two) times daily between meals.   Ensure Max Protein Liqd Take 330 mLs (11 oz total) by mouth 2 (two) times daily.   nystatin cream Commonly known as: MYCOSTATIN Apply 1 application topically 3 (three) times daily.   polyethylene glycol 17 g packet Commonly known as: MIRALAX / GLYCOLAX Take 17 g by mouth daily as needed for mild constipation or moderate constipation.   potassium chloride 10 MEQ tablet Commonly known as: KLOR-CON Take 1 tablet (10 meq) by mouth twice daily as needed when taking furosemide   QUEtiapine 50 MG tablet  Commonly known as: SEROQUEL TAKE 1 AND 1/2 TABLETS BY MOUTH NIGHTLY AS DIRECTED   rosuvastatin 20 MG tablet Commonly known as: CRESTOR Take 1 tablet (20 mg total) by mouth daily. What changed: how much to take   senna-docusate 8.6-50 MG tablet Commonly known as: Senokot-S Take 1 tablet by mouth 2 (two) times daily.   traMADol 50 MG tablet Commonly known as: ULTRAM Take 1 tablet (50 mg total) by mouth every 6 (six) hours as needed (mild pain).   vancomycin  IVPB Inject 1,250 mg into the vein daily for 12 days. Indication:  Diabetic foot wound and osteomyelitis with MRSA First Dose: Yes Last Day of Therapy:  05/28/2021 Labs - "Sunday/Monday:  CBC/D, CMP, and vancomycin trough. Labs - Thursday:  BMP and vancomycin trough Labs - Every other week:  ESR and CRP Method of administration:Elastomeric Method of administration may be changed at the discretion of the patient and/or caregiver's ability to self-administer the medication ordered.   vancomycin  IVPB Inject 1,250 mg into the vein daily for 11 days. Indication:  diabetic foot wound and osteomyelitis with MRSA First Dose: No Last Day of Therapy:  05/28/21 BMP twice a week, vanc trough weekly, PICC line care weekly Method of administration:Elastomeric Method of administration may be changed at the discretion  of the patient and/or caregiver's ability to self-administer the medication ordered. Start taking on: May 17, 2021   Xarelto 2.5 MG Tabs tablet Generic drug: rivaroxaban Take 1 tablet (2.5 mg total) by mouth 2 (two) times daily.               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  A cane will not resolve  issue with performing activities of daily living. A wheelchair will allow patient to safely perform daily activities. Patient is not able to propel themselves in the home using a standard weight wheelchair due to general weakness. Patient can self propel in the lightweight wheelchair. Length of need 6 months . Accessories: elevating leg rests (ELRs), wheel locks, extensions and anti-tippers.   05/16/21 1202              Discharge Care Instructions  (From admission, onward)           Start     Ordered   05/16/21 0000  Change dressing on IV access line weekly and PRN  (Home infusion instructions - [facility] )        05/16/21 1017   05/16/21 0000  Discharge wound care:       Comments: Keep the wound dry and change dressing as needed   05/16/21 1017   05/16/21 0000  Change dressing on IV access line weekly and PRN  (Home infusion instructions - [facility] )        10" /08/22 2947            Follow-up Information     Schnier, Dolores Lory, MD Follow up in 2  week(s).   Specialties: Vascular Surgery, Cardiology, Radiology, Vascular Surgery Why: To see Schnier. Will need ABI with visit. Contact information: [place] [address] [phone]         Idelle Crouch, MD. Schedule an appointment as soon as possible for a visit.   Specialty: Internal Medicine Contact information: [facility] [place] [address] [phone]         Carlyle Basques, MD. Schedule an appointment as soon as possible for a visit in 1 week(s).   Specialty: Infectious Diseases Contact information: [place] [address] [place] [address] [place] Follow up.   Why: [facility] will arrive at [place] tomorrow 05/17/21 for home health, with any questions you can call Malachy Mood at [phone] Contact information: [address] [place] [address] [phone]         [facility] - Dme Follow up.   Why: [facility] will arrive at your home tomorrow 10/9 to assist with IV treatments, for any questions you can reach Pam at [place], [place], DPM. Schedule an appointment as soon as possible for a visit in 1 week(s).   Specialty: Podiatry Contact information: [place] [address] [phone]                Allergies  Allergen Reactions   Metformin And Related Diarrhea   Oxycodone Hcl Nausea And Vomiting    Can still take this for pain medicine   Other Cough   Atorvastatin Cough   Biaxin [Clarithromycin] Itching   Codeine Nausea Only and Other (See Comments)    Constipates; doesn't like the way it makes her feel    Isosorbide Itching and Cough   Losartan Itching and Cough   Zetia [Ezetimibe] Diarrhea    Consultations: Infectious disease Vascular surgery Podiatry   Procedures/Studies: MR FOOT LEFT WO CONTRAST  Result Date: 05/11/2021 CLINICAL DATA:  Limping, acute, foot pain, infection suspected. Evaluate for necrotizing fasciitis and osteomyelitis EXAM: MRI OF THE LEFT FOOT WITHOUT CONTRAST TECHNIQUE: Multiplanar, multisequence MR imaging of the left  forefoot was performed. No intravenous contrast was administered. COMPARISON:  X-ray 05/11/2021, 08/24/2013 FINDINGS: Bones/Joint/Cartilage Bone marrow edema with confluent low T1 marrow signal changes within the fourth toe proximal phalanx as well as within the fourth metatarsal head. Findings are compatible with acute osteomyelitis. Large complex fourth MTP joint effusion, most compatible with septic arthritis. Bone marrow edema within the proximal phalanx of the left third toe with preservation of the T1 marrow signal suggesting reactive osteitis. Postsurgical changes of prior partial fifth ray resection without evidence of osteomyelitis of the residual fifth toe or fifth metatarsal. Bone marrow edema with linear low T1/T2 signal changes at the central navicular bone compatible with a subchondral fracture (series 10, image 16). No additional fractures. There is degenerative changes within the forefoot and midfoot, most pronounced at the second tarsometatarsal joint. Ligaments Intact Lisfranc ligament. Muscles and Tendons Chronic denervation changes of the intrinsic foot musculature. Flexor tenosynovitis of the fourth toe with fluid tracking into the proximal forefoot. Soft tissues Deep ulceration underlying the fourth metatarsal head. There is a contiguous fluid collection which involves the fourth MTP joint space measuring 2.2 x 1.9 x 2.0 cm. Small lobulated fluid collection tracks within the superficial soft tissues underlying the plantar fascia at the mid forefoot measuring approximately 2.7 x 0.5 x  1.1 cm. This appears contiguous with the overlying tenosynovial fluid collection related to the fourth toe flexor tendon (series 6, images 29-30). IMPRESSION: 1. Deep ulceration underlying the fourth metatarsal head with contiguous fluid collection extending to the fourth MTP joint space measuring up to 2.2 cm. Findings are compatible with septic arthritis of the fourth MTP joint. Acute osteomyelitis of the fourth  toe proximal phalanx and fourth metatarsal head. 2. Small lobulated fluid collection tracks within the superficial soft tissues underlying the plantar fascia at the level of the mid forefoot measuring 2.7 x 0.5 x 1.1 cm. This appears contiguous with the overlying tenosynovial fluid collection related to the fourth toe flexor tendon. 3. Findings of reactive osteitis of the third toe proximal phalanx. 4. Subchondral fracture of the navicular bone, likely subacute. 5. Postsurgical changes of prior partial fifth ray resection without evidence of osteomyelitis of the residual fifth toe or fifth metatarsal. Electronically Signed   By: Davina Poke D.O.   On: 05/11/2021 16:15   PERIPHERAL VASCULAR CATHETERIZATION  Result Date: 05/15/2021 See surgical note for result.  DG Foot 2 Views Left  Result Date: 05/13/2021 CLINICAL DATA:  Postop EXAM: LEFT FOOT - 2 VIEW COMPARISON:  05/11/2021 FINDINGS: Interval transmetatarsal amputation of the fourth digit with placement of multiple antibiotic beads. Previous partial resection of the fifth digit. Cut edge is are smooth. Navicular fracture by MRI is noted. IMPRESSION: Interval transmetatarsal amputation of fourth digit. Irregularity of the navicular presumably corresponds to MRI demonstrated subacute fracture Electronically Signed   By: Donavan Foil M.D.   On: 05/13/2021 19:01   DG Foot Complete Left  Result Date: 05/11/2021 CLINICAL DATA:  Wound infection to the left foot with redness spreading from the site and worsening pain x3 days. EXAM: LEFT FOOT - COMPLETE 3+ VIEW COMPARISON:  Left foot radiograph 05/16/2017 FINDINGS: Stable postoperative changes of fifth metatarsal amputation at the level of the mid shaft. The fifth toe proximal phalanx is not present, unchanged compared to 01/12/2013. Small amount of subcutaneous emphysema is noted within the plantar soft tissues of the distal lateral forefoot, likely corresponding to reported wound. No underlying cortical  abnormality. Mild osteoarthritic changes of the midfoot. Small posterior and plantar calcaneal enthesophytes. Os peroneum. IMPRESSION: Small amount of subcutaneous emphysema within the plantar soft tissues of the distal lateral forefoot, likely related to reported wound. No underlying osseous abnormality to suggest osteomyelitis. However, if there is clinical concern for osteomyelitis, MRI of the left foot can be obtained. Electronically Signed   By: Ileana Roup M.D.   On: 05/11/2021 14:49    Subjective: Patient was seen and examined today.  Husband at bedside.  Still having some left foot pain, ready to go home.  We discussed the home care again and she seems understanding.  Discharge Exam: Vitals:   05/16/21 0500 05/16/21 0825  BP: (!) 172/67 (!) 169/63  Pulse: (!) 59 (!) 59  Resp: 16 16  Temp: (!) 97.5 F (36.4 C) 98 F (36.7 C)  SpO2: 96% 95%   Vitals:   05/15/21 1958 05/15/21 2334 05/16/21 0500 05/16/21 0825  BP: 133/61 (!) 172/64 (!) 172/67 (!) 169/63  Pulse: (!) 59 (!) 58 (!) 59 (!) 59  Resp: '20 20 16 16  ' Temp: 98.4 F (36.9 C) (!) 97.3 F (36.3 C) (!) 97.5 F (36.4 C) 98 F (36.7 C)  TempSrc:   Oral Oral  SpO2: 95% 100% 96% 95%  Weight:      Height:  General: Pt is alert, awake, not in acute distress Cardiovascular: RRR, S1/S2 +, no rubs, no gallops Respiratory: CTA bilaterally, no wheezing, no rhonchi Abdominal: Soft, NT, ND, bowel sounds + Extremities: no edema, no cyanosis   The results of significant diagnostics from this hospitalization (including imaging, microbiology, ancillary and laboratory) are listed below for reference.    Microbiology: Recent Results (from the past 240 hour(s))  Aerobic/Anaerobic Culture w Gram Stain (surgical/deep wound)     Status: None (Preliminary result)   Collection Time: 05/11/21  5:59 PM   Specimen: Wound  Result Value Ref Range Status   Specimen Description   Final    WOUND Performed at [facility], [address], [place], [place] [address]    Special Requests   Final    LEFT FOOT Performed at [facility], [address] [place], [place] [address]    Gram Stain   Final    NO ORGANISMS SEEN SQUAMOUS EPITHELIAL CELLS PRESENT FEW WBC PRESENT, PREDOMINANTLY MONONUCLEAR ABUNDANT GRAM POSITIVE COCCI IN SINGLES IN CLUSTERS RARE GRAM POSITIVE RODS Performed at [facility], [place] [address] [place], [place] [address]    Culture   Final    ABUNDANT METHICILLIN RESISTANT STAPHYLOCOCCUS AUREUS FEW STAPHYLOCOCCUS EPIDERMIDIS NO ANAEROBES ISOLATED; CULTURE IN PROGRESS FOR 5 DAYS    Report Status PENDING  Incomplete   Organism ID, Bacteria METHICILLIN RESISTANT STAPHYLOCOCCUS AUREUS  Final   Organism ID, Bacteria STAPHYLOCOCCUS EPIDERMIDIS  Final      Susceptibility   Methicillin resistant staphylococcus aureus - MIC*    CIPROFLOXACIN <=0.5 SENSITIVE Sensitive     ERYTHROMYCIN >=8 RESISTANT Resistant     GENTAMICIN <=0.5 SENSITIVE Sensitive     OXACILLIN >=4 RESISTANT Resistant     TETRACYCLINE <=1 SENSITIVE Sensitive     VANCOMYCIN <=0.5 SENSITIVE Sensitive     TRIMETH/SULFA <=10 SENSITIVE Sensitive     CLINDAMYCIN 2 INTERMEDIATE Intermediate     RIFAMPIN 1 SENSITIVE Sensitive     Inducible Clindamycin NEGATIVE Sensitive     * ABUNDANT METHICILLIN RESISTANT STAPHYLOCOCCUS AUREUS   Staphylococcus epidermidis - MIC*    CIPROFLOXACIN <=0.5 SENSITIVE Sensitive     ERYTHROMYCIN >=8 RESISTANT Resistant     GENTAMICIN <=0.5 SENSITIVE Sensitive     OXACILLIN <=0.25 SENSITIVE Sensitive     TETRACYCLINE <=1 SENSITIVE Sensitive     VANCOMYCIN 2 SENSITIVE Sensitive     TRIMETH/SULFA <=10 SENSITIVE Sensitive     CLINDAMYCIN <=0.25 SENSITIVE Sensitive     RIFAMPIN <=0.5 SENSITIVE Sensitive     Inducible Clindamycin NEGATIVE Sensitive     * FEW STAPHYLOCOCCUS EPIDERMIDIS  Resp Panel by RT-PCR (Flu A&B, Covid) Nasopharyngeal Swab     Status: None   Collection Time: 05/11/21  8:31  PM   Specimen: Nasopharyngeal Swab; Nasopharyngeal(NP) swabs in vial transport medium  Result Value Ref Range Status   SARS Coronavirus 2 by RT PCR NEGATIVE NEGATIVE Final    Comment: (NOTE) SARS-CoV-2 target nucleic acids are NOT DETECTED.  The SARS-CoV-2 RNA is generally detectable in upper respiratory specimens during the acute phase of infection. The lowest concentration of SARS-CoV-2 viral copies this assay can detect is 138 copies/mL. A negative result does not preclude SARS-Cov-2 infection and should not be used as the sole basis for treatment or other patient management decisions. A negative result may occur with  improper specimen collection/handling, submission of specimen other than nasopharyngeal swab, presence of viral mutation(s) within the areas targeted by this assay, and inadequate  number of viral copies(<138 copies/mL). A negative result must be combined with clinical observations, patient history, and epidemiological information. The expected result is Negative.  Fact Sheet for Patients:  EntrepreneurPulse.com.au  Fact Sheet for Healthcare Providers:  IncredibleEmployment.be  This test is no t yet approved or cleared by the [place] FDA and  has been authorized for detection and/or diagnosis of SARS-CoV-2 by FDA under an Emergency Use Authorization (EUA). This EUA will remain  in effect (meaning this test can be used) for the duration of the COVID-19 declaration under Section 564(b)(1) of the Act, 21 U.S.C.section 360bbb-3(b)(1), unless the authorization is terminated  or revoked sooner.       Influenza A by PCR NEGATIVE NEGATIVE Final   Influenza B by PCR NEGATIVE NEGATIVE Final    Comment: (NOTE) The Xpert Xpress SARS-CoV-2/FLU/RSV plus assay is intended as an aid in the diagnosis of influenza from Nasopharyngeal swab specimens and should not be used as a sole basis for treatment. Nasal washings and aspirates are  unacceptable for Xpert Xpress SARS-CoV-2/FLU/RSV testing.  Fact Sheet for Patients: EntrepreneurPulse.com.au  Fact Sheet for Healthcare Providers: IncredibleEmployment.be  This test is not yet approved or cleared by the [place] FDA and has been authorized for detection and/or diagnosis of SARS-CoV-2 by FDA under an Emergency Use Authorization (EUA). This EUA will remain in effect (meaning this test can be used) for the duration of the COVID-19 declaration under Section 564(b)(1) of the Act, 21 U.S.C. section 360bbb-3(b)(1), unless the authorization is terminated or revoked.  Performed at [facility], [address] [place], [place] [address]   MRSA Next Gen by PCR, Nasal     Status: None   Collection Time: 05/12/21 10:55 AM   Specimen: Nasal Mucosa; Nasal Swab  Result Value Ref Range Status   MRSA by PCR Next Gen NOT DETECTED NOT DETECTED Final    Comment: (NOTE) The GeneXpert MRSA Assay (FDA approved for NASAL specimens only), is one component of a comprehensive MRSA colonization surveillance program. It is not intended to diagnose MRSA infection nor to guide or monitor treatment for MRSA infections. Test performance is not FDA approved in patients less than 29 years old. Performed at [facility], [place], [place] [address]   Aerobic/Anaerobic Culture w Gram Stain (surgical/deep wound)     Status: None (Preliminary result)   Collection Time: 05/13/21  5:29 PM   Specimen: PATH Bone biopsy; Tissue  Result Value Ref Range Status   Specimen Description   Final    FOOT LEFT 4TH METATARSAL Performed at [facility], [address] [place], [place] [address]    Special Requests   Final    NONE Performed at [facility], [address] [place], [place] [address]    Gram Stain   Final    NO ORGANISMS SEEN SQUAMOUS EPITHELIAL CELLS PRESENT FEW WBC PRESENT,BOTH PMN AND  MONONUCLEAR FEW GRAM POSITIVE COCCI Performed at [facility], [place] [address] [place], [place] [address]    Culture   Final    FEW STAPHYLOCOCCUS AUREUS NO ANAEROBES ISOLATED; CULTURE IN PROGRESS FOR 5 DAYS    Report Status PENDING  Incomplete   Organism ID, Bacteria STAPHYLOCOCCUS AUREUS  Final      Susceptibility   Staphylococcus aureus - MIC*    CIPROFLOXACIN <=0.5 SENSITIVE Sensitive     ERYTHROMYCIN >=8 RESISTANT Resistant     GENTAMICIN <=0.5 SENSITIVE Sensitive     OXACILLIN 0.5 SENSITIVE Sensitive  TETRACYCLINE <=1 SENSITIVE Sensitive     VANCOMYCIN <=0.5 SENSITIVE Sensitive     TRIMETH/SULFA <=10 SENSITIVE Sensitive     CLINDAMYCIN <=0.25 SENSITIVE Sensitive     RIFAMPIN <=0.5 SENSITIVE Sensitive     Inducible Clindamycin NEGATIVE Sensitive     * FEW STAPHYLOCOCCUS AUREUS     Labs: BNP (last 3 results) No results for input(s): BNP in the last 8760 hours. Basic Metabolic Panel: Recent Labs  Lab 05/11/21 1328 05/12/21 0400 05/13/21 0316 05/15/21 0440 05/16/21 0413  NA 132* 134* 136 134* 133*  K 3.8 3.4* 3.9 4.0 4.5  CL 97* 102 102 102 104  CO2 '25 26 26 25 22  ' GLUCOSE 292* 245* 198* 135* 332*  BUN 25* 29* 30* 49* 42*  CREATININE 0.98 0.92 0.89 1.06* 0.76  CALCIUM 8.6* 8.2* 8.4* 8.3* 8.5*  MG  --   --   --  2.2 2.2   Liver Function Tests: Recent Labs  Lab 05/11/21 1328  AST 19  ALT 20  ALKPHOS 109  BILITOT 1.0  PROT 6.7  ALBUMIN 3.0*   No results for input(s): LIPASE, AMYLASE in the last 168 hours. No results for input(s): AMMONIA in the last 168 hours. CBC: Recent Labs  Lab 05/11/21 1328 05/12/21 0400 05/13/21 0316 05/15/21 0440 05/16/21 0413  WBC 14.4* 9.6 8.5 9.0 7.3  NEUTROABS 12.6*  --   --   --   --   HGB 11.4* 9.7* 9.7* 9.7* 10.1*  HCT 31.7* 27.9* 28.3* 28.6* 30.4*  MCV 83.9 83.8 86.5 88.0 87.4  PLT 249 200 217 288 320   Cardiac Enzymes: Recent Labs  Lab 05/11/21 1328  CKTOTAL 20*   BNP: Invalid input(s):  POCBNP CBG: Recent Labs  Lab 05/15/21 1650 05/15/21 2007 05/16/21 0021 05/16/21 0915 05/16/21 1223  GLUCAP 140* 348* 362* 246* 268*   D-Dimer No results for input(s): DDIMER in the last 72 hours. Hgb A1c No results for input(s): HGBA1C in the last 72 hours. Lipid Profile No results for input(s): CHOL, HDL, LDLCALC, TRIG, CHOLHDL, LDLDIRECT in the last 72 hours. Thyroid function studies No results for input(s): TSH, T4TOTAL, T3FREE, THYROIDAB in the last 72 hours.  Invalid input(s): FREET3 Anemia work up No results for input(s): VITAMINB12, FOLATE, FERRITIN, TIBC, IRON, RETICCTPCT in the last 72 hours. Urinalysis    Component Value Date/Time   COLORURINE AMBER (A) 05/11/2021 1748   APPEARANCEUR CLOUDY (A) 05/11/2021 1748   LABSPEC 1.026 05/11/2021 1748   PHURINE 5.0 05/11/2021 1748   GLUCOSEU 50 (A) 05/11/2021 1748   HGBUR NEGATIVE 05/11/2021 1748   BILIRUBINUR NEGATIVE 05/11/2021 1748   KETONESUR NEGATIVE 05/11/2021 1748   PROTEINUR >=300 (A) 05/11/2021 1748   UROBILINOGEN 0.2 01/05/2015 2205   NITRITE NEGATIVE 05/11/2021 1748   LEUKOCYTESUR MODERATE (A) 05/11/2021 1748   Sepsis Labs Invalid input(s): PROCALCITONIN,  WBC,  LACTICIDVEN Microbiology Recent Results (from the past 240 hour(s))  Aerobic/Anaerobic Culture w Gram Stain (surgical/deep wound)     Status: None (Preliminary result)   Collection Time: 05/11/21  5:59 PM   Specimen: Wound  Result Value Ref Range Status   Specimen Description   Final    WOUND Performed at [facility], [address], [place], [place] [address]    Special Requests   Final    LEFT FOOT Performed at [facility], [address] [place], [place] [address]    Gram Stain   Final    NO ORGANISMS SEEN SQUAMOUS EPITHELIAL CELLS PRESENT FEW  WBC PRESENT, PREDOMINANTLY MONONUCLEAR ABUNDANT GRAM POSITIVE COCCI IN SINGLES IN CLUSTERS RARE GRAM POSITIVE RODS Performed at [facility], [place] [address],  [place], [place] [address]    Culture   Final    ABUNDANT METHICILLIN RESISTANT STAPHYLOCOCCUS AUREUS FEW STAPHYLOCOCCUS EPIDERMIDIS NO ANAEROBES ISOLATED; CULTURE IN PROGRESS FOR 5 DAYS    Report Status PENDING  Incomplete   Organism ID, Bacteria METHICILLIN RESISTANT STAPHYLOCOCCUS AUREUS  Final   Organism ID, Bacteria STAPHYLOCOCCUS EPIDERMIDIS  Final      Susceptibility   Methicillin resistant staphylococcus aureus - MIC*    CIPROFLOXACIN <=0.5 SENSITIVE Sensitive     ERYTHROMYCIN >=8 RESISTANT Resistant     GENTAMICIN <=0.5 SENSITIVE Sensitive     OXACILLIN >=4 RESISTANT Resistant     TETRACYCLINE <=1 SENSITIVE Sensitive     VANCOMYCIN <=0.5 SENSITIVE Sensitive     TRIMETH/SULFA <=10 SENSITIVE Sensitive     CLINDAMYCIN 2 INTERMEDIATE Intermediate     RIFAMPIN 1 SENSITIVE Sensitive     Inducible Clindamycin NEGATIVE Sensitive     * ABUNDANT METHICILLIN RESISTANT STAPHYLOCOCCUS AUREUS   Staphylococcus epidermidis - MIC*    CIPROFLOXACIN <=0.5 SENSITIVE Sensitive     ERYTHROMYCIN >=8 RESISTANT Resistant     GENTAMICIN <=0.5 SENSITIVE Sensitive     OXACILLIN <=0.25 SENSITIVE Sensitive     TETRACYCLINE <=1 SENSITIVE Sensitive     VANCOMYCIN 2 SENSITIVE Sensitive     TRIMETH/SULFA <=10 SENSITIVE Sensitive     CLINDAMYCIN <=0.25 SENSITIVE Sensitive     RIFAMPIN <=0.5 SENSITIVE Sensitive     Inducible Clindamycin NEGATIVE Sensitive     * FEW STAPHYLOCOCCUS EPIDERMIDIS  Resp Panel by RT-PCR (Flu A&B, Covid) Nasopharyngeal Swab     Status: None   Collection Time: 05/11/21  8:31 PM   Specimen: Nasopharyngeal Swab; Nasopharyngeal(NP) swabs in vial transport medium  Result Value Ref Range Status   SARS Coronavirus 2 by RT PCR NEGATIVE NEGATIVE Final    Comment: (NOTE) SARS-CoV-2 target nucleic acids are NOT DETECTED.  The SARS-CoV-2 RNA is generally detectable in upper respiratory specimens during the acute phase of infection. The lowest concentration of SARS-CoV-2 viral copies this  assay can detect is 138 copies/mL. A negative result does not preclude SARS-Cov-2 infection and should not be used as the sole basis for treatment or other patient management decisions. A negative result may occur with  improper specimen collection/handling, submission of specimen other than nasopharyngeal swab, presence of viral mutation(s) within the areas targeted by this assay, and inadequate number of viral copies(<138 copies/mL). A negative result must be combined with clinical observations, patient history, and epidemiological information. The expected result is Negative.  Fact Sheet for Patients:  EntrepreneurPulse.com.au  Fact Sheet for Healthcare Providers:  IncredibleEmployment.be  This test is no t yet approved or cleared by the [place] FDA and  has been authorized for detection and/or diagnosis of SARS-CoV-2 by FDA under an Emergency Use Authorization (EUA). This EUA will remain  in effect (meaning this test can be used) for the duration of the COVID-19 declaration under Section 564(b)(1) of the Act, 21 U.S.C.section 360bbb-3(b)(1), unless the authorization is terminated  or revoked sooner.       Influenza A by PCR NEGATIVE NEGATIVE Final   Influenza B by PCR NEGATIVE NEGATIVE Final    Comment: (NOTE) The Xpert Xpress SARS-CoV-2/FLU/RSV plus assay is intended as an aid in the diagnosis of influenza from Nasopharyngeal swab specimens and should not be used as a sole basis for treatment.  Nasal washings and aspirates are unacceptable for Xpert Xpress SARS-CoV-2/FLU/RSV testing.  Fact Sheet for Patients: EntrepreneurPulse.com.au  Fact Sheet for Healthcare Providers: IncredibleEmployment.be  This test is not yet approved or cleared by the [place] FDA and has been authorized for detection and/or diagnosis of SARS-CoV-2 by FDA under an Emergency Use Authorization (EUA). This EUA will  remain in effect (meaning this test can be used) for the duration of the COVID-19 declaration under Section 564(b)(1) of the Act, 21 U.S.C. section 360bbb-3(b)(1), unless the authorization is terminated or revoked.  Performed at [facility], [address] [place], [place] [address]   MRSA Next Gen by PCR, Nasal     Status: None   Collection Time: 05/12/21 10:55 AM   Specimen: Nasal Mucosa; Nasal Swab  Result Value Ref Range Status   MRSA by PCR Next Gen NOT DETECTED NOT DETECTED Final    Comment: (NOTE) The GeneXpert MRSA Assay (FDA approved for NASAL specimens only), is one component of a comprehensive MRSA colonization surveillance program. It is not intended to diagnose MRSA infection nor to guide or monitor treatment for MRSA infections. Test performance is not FDA approved in patients less than 54 years old. Performed at [facility], [place], [place] [address]   Aerobic/Anaerobic Culture w Gram Stain (surgical/deep wound)     Status: None (Preliminary result)   Collection Time: 05/13/21  5:29 PM   Specimen: PATH Bone biopsy; Tissue  Result Value Ref Range Status   Specimen Description   Final    FOOT LEFT 4TH METATARSAL Performed at [facility], [address] [place], [place] [address]    Special Requests   Final    NONE Performed at [facility], [address] [place], [place] [address]    Gram Stain   Final    NO ORGANISMS SEEN SQUAMOUS EPITHELIAL CELLS PRESENT FEW WBC PRESENT,BOTH PMN AND MONONUCLEAR FEW GRAM POSITIVE COCCI Performed at [facility], [place] [address] [place], [place] [address]    Culture   Final    FEW STAPHYLOCOCCUS AUREUS NO ANAEROBES ISOLATED; CULTURE IN PROGRESS FOR 5 DAYS    Report Status PENDING  Incomplete   Organism ID, Bacteria STAPHYLOCOCCUS AUREUS  Final      Susceptibility   Staphylococcus aureus - MIC*    CIPROFLOXACIN <=0.5 SENSITIVE Sensitive     ERYTHROMYCIN  >=8 RESISTANT Resistant     GENTAMICIN <=0.5 SENSITIVE Sensitive     OXACILLIN 0.5 SENSITIVE Sensitive     TETRACYCLINE <=1 SENSITIVE Sensitive     VANCOMYCIN <=0.5 SENSITIVE Sensitive     TRIMETH/SULFA <=10 SENSITIVE Sensitive     CLINDAMYCIN <=0.25 SENSITIVE Sensitive     RIFAMPIN <=0.5 SENSITIVE Sensitive     Inducible Clindamycin NEGATIVE Sensitive     * FEW STAPHYLOCOCCUS AUREUS    Time coordinating discharge: Over 30 minutes  SIGNED:  Lorella Nimrod, MD  [facility] 05/16/2021, 1:30 PM  If 7PM-7AM, please contact night-coverage [email or web address]  This record has been created using Systems analyst. Errors have been sought and corrected,but may not always be located. Such creation errors do not reflect on the standard of care.

## 2021-05-16 NOTE — TOC Transition Note (Signed)
Transition of Care [facility]) - CM/SW Discharge Note   Patient Details  Name: [name] MRN: [id number] Date of Birth: 23-Apr-1951  [facility]) CM/SW Contact:  Gildardo Griffes, LCSW Phone Number: 05/16/2021, 11:18 AM   Clinical Narrative:     Patient to discharge home today.   CSW has spoken with Elnita Maxwell with [facility] to confirm they are aware of patient's discharge and will see her for home health PT and RN tomorrow morning.   CSW spoke with Jeri Modena with [facility], they are also aware of dc today and will visit patient in the morning.   CSW spoke with patient, no questions or concerns at this time. Does report needing wheel chair and 4 pronged cane, CSW to order to be delivered to hospital room prior to patient's discharge pending orders (MD notified). Patient reports her husband bought a 3in1. No other DME needs identified right now.   Patient reports her husband to pick her up today at time of discharge.   No other discharge needs identified.    Final next level of care: Home w Home Health Services Barriers to Discharge: No Barriers Identified   Patient Goals and CMS Choice Patient states their goals for this hospitalization and ongoing recovery are:: to go home CMS Medicare.gov Compare Post Acute Care list provided to:: Patient Choice offered to / list presented to : Patient  Discharge Placement                Patient to be transferred to facility by: husband Name of family member notified: spouse Patient and family notified of of transfer: 05/16/21  Discharge Plan and Services   Discharge Planning Services: CM Consult Post Acute Care Choice: Durable Medical Equipment (TBD)          DME Arranged: Community education officer wheelchair with seat cushion, Medical laboratory scientific officer DME Agency: [facility] Date DME Agency Contacted: 05/16/21     HH Arranged: PT, RN HH Agency: [facility] Date HH Agency Contacted: 05/16/21   Representative spoke  with at [facility] Agency: Elnita Maxwell  Social Determinants of Health (SDOH) Interventions     Readmission Risk Interventions No flowsheet data found.

## 2021-05-16 NOTE — Progress Notes (Signed)
    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  A cane will not resolve  issue with performing activities of daily living. A wheelchair will allow patient to safely perform daily activities. Patient is not able to propel themselves in the home using a standard weight wheelchair due to general weakness. Patient can self propel in the lightweight wheelchair. Length of need 6 months . Accessories: elevating leg rests (ELRs), wheel locks, extensions and anti-tippers.   05/16/21 [address] [place] [phone]

## 2021-05-16 NOTE — Progress Notes (Signed)
Physical Therapy Treatment Patient Details Name: [name] MRN: [id number] DOB: 15-Jun-1951 Today's Date: 05/16/2021   History of Present Illness Pt is a 70 y.o. 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  PMH includes CHF, anxiety, a-fib, htn, PAD, PE s/p CABG, DM, mandible fx sx, peripheral vascular catheterization. Pt is now s/p LLE angiography on 05/15/21 with continue on transfer orders. PMH includes CHF, anxiety, a-fib, htn, PAD, PE s/p CABG, DM, mandible fx sx, peripheral vascular catheterization.    PT Comments    Pt seen for PT evaluation with session focusing on d/c planning & gait with LRAD. Pt voices significant fear re: using RW for gait 2/2 past experiences. Pt trialed knee scooter but only able to take 2 steps with heavy min assist, impaired balance & impaired ability to take steps. Educated pt on use of RW & pt able to ambulate only 4 ft with AD with decreased step length RLE. Pt also demonstrates decreased balance as she leans R & has difficulty adjusting to new BOS with RLE as she is NWB LLE. PT educates pt on ability to d/c home with BSC, w/c and HHPT f/u for ongoing education re: gait with RW. Pt very tearful & anxious throughout session. Will continue to follow pt acutely to progress gait as able.     Recommendations for follow up therapy are one component of a multi-disciplinary discharge planning process, led by the attending physician.  Recommendations may be updated based on patient status, additional functional criteria and insurance authorization.  Follow Up Recommendations  Home health PT;Supervision for mobility/OOB     Equipment Recommendations  Rolling walker with 5" wheels;3in1 (PT);Wheelchair (measurements PT);Wheelchair cushion (measurements PT) (could also get transport chair vs w/c if  pt feels w/c will not fit into her apartment)   Recommendations for Other Services       Precautions / Restrictions Precautions Precautions: Fall Precaution Comments: Elevate L LE on 2 pillows Restrictions Weight Bearing Restrictions: Yes LLE Weight Bearing: Non weight bearing Other Position/Activity Restrictions: NWB L LE s/p partial amp of 4th ray and I&D; may put weight on heel for transfers only on L foot in surgical shoe     Mobility  Bed Mobility Overal bed mobility: Modified Independent Bed Mobility: Supine to Sit     Supine to sit: Modified independent (Device/Increase time);HOB elevated          Transfers Overall transfer level: Needs assistance Equipment used:  (knee scooter, RW) Transfers: Sit to/from Stand Sit to Stand: Min guard         General transfer comment: Pt verbalizes good awareness of NWB LLE during transfers  Ambulation/Gait Ambulation/Gait assistance: Min guard;Min assist Gait Distance (Feet): 4 Feet Assistive device:  (knee scooter & RW) Gait Pattern/deviations: Decreased step length - right;Decreased stride length;Step-to pattern Gait velocity: decreased   General Gait Details: Pt attempts to take 2 steps with knee scooter but requires heavy min assist & demonstrates decreased balance & ability to advance RLE. Pt is able to ambulate ~4 ft with RW & CGA<>Min assist with ongoing education re: sequencing & step to pattern with RW but pt continues to hop & with L lateral lean & decreased balance. Pt with decreased step length RLE.   Stairs             Psychologist, prison and probation services  Modified Rankin (Stroke Patients Only)       Balance Overall balance assessment: Needs assistance Sitting-balance support: No upper extremity supported;Feet supported Sitting balance-Leahy Scale: Normal     Standing balance support: Single extremity supported;During functional activity Standing balance-Leahy Scale: Poor                               Cognition Arousal/Alertness: Awake/alert Behavior During Therapy: Anxious Overall Cognitive Status: Within Functional Limits for tasks assessed                                 General Comments: tearful throughout session      Exercises      General Comments        Pertinent Vitals/Pain      Home Living                      Prior Function            PT Goals (current goals can now be found in the care plan section) Acute Rehab PT Goals Patient Stated Goal: to go home PT Goal Formulation: With patient Time For Goal Achievement: 05/28/21 Potential to Achieve Goals: Good Progress towards PT goals: Progressing toward goals    Frequency    7X/week      PT Plan Current plan remains appropriate    Co-evaluation              AM-PAC PT "6 Clicks" Mobility   Outcome Measure  Help needed turning from your back to your side while in a flat bed without using bedrails?: None Help needed moving from lying on your back to sitting on the side of a flat bed without using bedrails?: None Help needed moving to and from a bed to a chair (including a wheelchair)?: A Little Help needed standing up from a chair using your arms (e.g., wheelchair or bedside chair)?: A Little Help needed to walk in hospital room?: A Lot Help needed climbing 3-5 steps with a railing? : Total 6 Click Score: 17    End of Session   Activity Tolerance: Patient tolerated treatment well Patient left: with bed alarm set;with call bell/phone within reach (sitting EOB)   PT Visit Diagnosis: Unsteadiness on feet (R26.81);Other abnormalities of gait and mobility (R26.89);Muscle weakness (generalized) (M62.81)     Time: 0086-7619 PT Time Calculation (min) (ACUTE ONLY): 23 min  Charges:  $Gait Training: 8-22 mins $Therapeutic Activity: 8-22 mins                     Aleda Grana, PT, DPT 05/16/21, 10:55 AM    Sandi Mariscal 05/16/2021, 10:52 AM

## 2021-05-16 NOTE — Progress Notes (Signed)
1 Day Post-Op   Subjective/Chief Complaint: Patient is doing well and is ready to go home.   Objective: Vital signs in last 24 hours: Temp:  [97.3 F (36.3 C)-98.4 F (36.9 C)] 98 F (36.7 C) (10/08 0825) Pulse Rate:  [58-59] 59 (10/08 0825) Resp:  [16-20] 16 (10/08 0825) BP: (133-172)/(61-67) 169/63 (10/08 0825) SpO2:  [95 %-100 %] 95 % (10/08 0825) Last BM Date: 05/15/21  Intake/Output from previous day: 10/07 0701 - 10/08 0700 In: 1101.3 [P.O.:240; I.V.:711.3; IV Piggyback:150] Out: 5 [Blood:5] Intake/Output this shift: Total I/O In: 360 [P.O.:360] Out: -   General appearance: alert, cooperative, and appears stated age Resp: clear to auscultation bilaterally Pulses: Right Pulses: FEM: present 1+ and equal, POP: present 1+ and equal, DP: present 1+, PT: absent Left Pulses: FEM: present 1+, POP: present 1+, DP: present 1+, PT: present 1+  Lab Results:  Recent Labs    05/15/21 0440 05/16/21 0413  WBC 9.0 7.3  HGB 9.7* 10.1*  HCT 28.6* 30.4*  PLT 288 320   BMET Recent Labs    05/15/21 0440 05/16/21 0413  NA 134* 133*  K 4.0 4.5  CL 102 104  CO2 25 22  GLUCOSE 135* 332*  BUN 49* 42*  CREATININE 1.06* 0.76  CALCIUM 8.3* 8.5*   PT/INR No results for input(s): LABPROT, INR in the last 72 hours. ABG No results for input(s): PHART, HCO3 in the last 72 hours.  Invalid input(s): PCO2, PO2  Studies/Results: PERIPHERAL VASCULAR CATHETERIZATION  Result Date: 05/15/2021 See surgical note for result.   Anti-infectives: Anti-infectives (From admission, onward)    Start     Dose/Rate Route Frequency Ordered Stop   05/17/21 0000  vancomycin IVPB        1,250 mg Intravenous Every 24 hours 05/16/21 0927 05/28/21 2359   05/16/21 0700  vancomycin (VANCOREADY) IVPB 1250 mg/250 mL        1,250 mg 166.7 mL/hr over 90 Minutes Intravenous Every 24 hours 05/15/21 2152     05/16/21 0000  vancomycin IVPB        1,250 mg Intravenous Every 24 hours 05/16/21 1017 05/28/21  2359   05/14/21 1230  fluconazole (DIFLUCAN) tablet 200 mg        200 mg Oral  Once 05/14/21 1132 05/14/21 1202   05/14/21 1200  vancomycin (VANCOREADY) IVPB 750 mg/150 mL  Status:  Discontinued        750 mg 150 mL/hr over 60 Minutes Intravenous Every 12 hours 05/14/21 1049 05/15/21 2141   05/13/21 1726  vancomycin (VANCOCIN) powder  Status:  Discontinued          As needed 05/13/21 1726 05/13/21 1811   05/12/21 1200  vancomycin (VANCOREADY) IVPB 1500 mg/300 mL  Status:  Discontinued        1,500 mg 150 mL/hr over 120 Minutes Intravenous Every 24 hours 05/12/21 1048 05/14/21 1049   05/12/21 0000  cefTRIAXone (ROCEPHIN) 2 g in sodium chloride 0.9 % 100 mL IVPB  Status:  Discontinued       See Hyperspace for full Linked Orders Report.   2 g 200 mL/hr over 30 Minutes Intravenous Every 24 hours 05/11/21 1451 05/15/21 0932   05/11/21 2200  metroNIDAZOLE (FLAGYL) IVPB 500 mg  Status:  Discontinued       See Hyperspace for full Linked Orders Report.   500 mg 100 mL/hr over 60 Minutes Intravenous Every 8 hours 05/11/21 1451 05/14/21 0804   05/11/21 1415  vancomycin (VANCOCIN) IVPB 1000 mg/200  mL premix        1,000 mg 200 mL/hr over 60 Minutes Intravenous  Once 05/11/21 1409 05/11/21 1937   05/11/21 1415  piperacillin-tazobactam (ZOSYN) IVPB 3.375 g        3.375 g 100 mL/hr over 30 Minutes Intravenous  Once 05/11/21 1409 05/11/21 1727       Assessment/Plan: s/p Procedure(s): LOWER EXTREMITY ANGIOGRAPHY (Left) Discharge Okay for discharge,  resume all home meds in addition to Plavix 75 mg po daily and Xarelto 2.5 mg po BID She will follow-up with Dr. Wyn Quaker in the clinic.   LOS: 5 days    [name] 05/16/2021
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
## 2021-05-17 DIAGNOSIS — E1151 Type 2 diabetes mellitus with diabetic peripheral angiopathy without gangrene: Secondary | ICD-10-CM | POA: Insufficient documentation

## 2021-05-18 ENCOUNTER — Telehealth: Payer: Self-pay | Admitting: Cardiovascular Disease

## 2021-05-18 ENCOUNTER — Encounter: Payer: Self-pay | Admitting: Vascular Surgery

## 2021-05-18 ENCOUNTER — Telehealth (INDEPENDENT_AMBULATORY_CARE_PROVIDER_SITE_OTHER): Payer: Self-pay

## 2021-05-18 LAB — AEROBIC/ANAEROBIC CULTURE W GRAM STAIN (SURGICAL/DEEP WOUND)

## 2021-05-18 NOTE — Telephone Encounter (Signed)
   Pt c/o medication issue:  1. Name of Medication: plavix 75 mg po q d   2. How are you currently taking this medication (dosage and times per day)? Holding until hears back from cardiology   3. Are you having a reaction (difficulty breathing--STAT)? No   4. What is your medication issue?  Recent toe amputation .  Patient given lovenox during admission and  given rx for anti coag at discharge   Patient wants to ask about this first

## 2021-05-18 NOTE — Telephone Encounter (Signed)
Was able to reach back out to [name] regarding Plavix, pt unsure why she was started on it as she has never been on a blood thinner and "is not happy about it at all, I don't want to be on it". Was able to advised [name] that being on Plavix may help with the blood flow to her lower extremity as she just had an amputation d/t vascular compromise.   Per admission notes from vascular, Dr. Louisa Second Assessment/Plan: s/p Procedure(s): LOWER EXTREMITY ANGIOGRAPHY (Left) Discharge Okay for discharge,  resume all home meds in addition to Plavix 75 mg po daily and Xarelto 2.5 mg po BID She will follow-up with Dr. Wyn Quaker in the clinic.  Suggest pt to call her primary vascular provider Dr. [name] for more clarification as she is suppose to also f/u with him in 2 weeks post admission.   [name] thankful for returning her call and will call Dr. Marijean Heath office.

## 2021-05-19 NOTE — Telephone Encounter (Signed)
Pt called and left a VM on the nurses line saying that she would like to know why she has to start 2 blood thinner she would like to know was it due to her recent US. I spoke with Dr. Wyn Quaker who performed the procedure and per him I made the pt aware she does need to continue with her blood thinners as prescribed theophylline pt voiced understanding.

## 2021-05-20 ENCOUNTER — Ambulatory Visit: Payer: Medicare Other | Admitting: Cardiovascular Disease

## 2021-05-28 ENCOUNTER — Telehealth: Payer: Self-pay

## 2021-05-28 NOTE — Telephone Encounter (Signed)
Requested lab results. OPT end date today. I will review and send to DR Sampson Goon and then order PULL PICC once I am ordered to.

## 2021-06-04 ENCOUNTER — Telehealth: Payer: Self-pay

## 2021-06-04 NOTE — Telephone Encounter (Signed)
Will see DR Sampson Goon on 06/11/2021. He extended IV ABX until he sees patient.

## 2021-06-05 ENCOUNTER — Ambulatory Visit: Payer: Medicare Other | Admitting: Cardiovascular Disease

## 2021-06-16 ENCOUNTER — Telehealth (INDEPENDENT_AMBULATORY_CARE_PROVIDER_SITE_OTHER): Payer: Self-pay

## 2021-06-16 NOTE — Telephone Encounter (Signed)
Patient was made aware with medical advice and verbalized understanding 

## 2021-06-16 NOTE — Telephone Encounter (Signed)
The patient is on blood thinners so extended bleeding is expected.  She just had intervention on 05/15/2021 and it is indicated that she remain on all medications for at least 3 months.  This is because stopping them can result in decrease blood flow and issues with the interventions we have done.  Give the patient's extensive vascular issues, stopping her medications at this time are not in her best interest unless she has heavy bleeding.

## 2021-06-29 ENCOUNTER — Ambulatory Visit: Payer: Medicare Other | Admitting: Cardiovascular Disease

## 2021-06-30 ENCOUNTER — Encounter: Payer: Self-pay | Admitting: Cardiovascular Disease

## 2021-08-04 ENCOUNTER — Other Ambulatory Visit (INDEPENDENT_AMBULATORY_CARE_PROVIDER_SITE_OTHER): Payer: Self-pay | Admitting: Vascular Surgery

## 2021-08-17 ENCOUNTER — Telehealth (INDEPENDENT_AMBULATORY_CARE_PROVIDER_SITE_OTHER): Payer: Self-pay

## 2021-09-17 ENCOUNTER — Other Ambulatory Visit: Payer: Self-pay

## 2021-09-17 ENCOUNTER — Ambulatory Visit (INDEPENDENT_AMBULATORY_CARE_PROVIDER_SITE_OTHER): Payer: TRICARE For Life (TFL) | Admitting: Vascular Surgery

## 2021-09-17 ENCOUNTER — Ambulatory Visit (INDEPENDENT_AMBULATORY_CARE_PROVIDER_SITE_OTHER): Payer: Medicare Other

## 2021-09-17 ENCOUNTER — Other Ambulatory Visit (INDEPENDENT_AMBULATORY_CARE_PROVIDER_SITE_OTHER): Payer: Self-pay | Admitting: Vascular Surgery

## 2021-09-17 DIAGNOSIS — I70213 Atherosclerosis of native arteries of extremities with intermittent claudication, bilateral legs: Secondary | ICD-10-CM

## 2021-09-18 ENCOUNTER — Telehealth (INDEPENDENT_AMBULATORY_CARE_PROVIDER_SITE_OTHER): Payer: Self-pay | Admitting: Nurse Practitioner

## 2021-09-18 NOTE — Telephone Encounter (Signed)
[name], [name] called and wanted to know if she could be taken off her blood thinners.  She states she has an appointment for a colonoscopy and wants to know before that appointment.

## 2021-09-22 ENCOUNTER — Telehealth (INDEPENDENT_AMBULATORY_CARE_PROVIDER_SITE_OTHER): Payer: Self-pay | Admitting: Nurse Practitioner

## 2021-09-22 NOTE — Telephone Encounter (Signed)
She can stop 5 days prior and should resume 48 hours after

## 2021-09-22 NOTE — Telephone Encounter (Signed)
[name] want get the results of her test and to know if she can be taken off her blood thinners.  She states her doctor thinks she has cancer and feels that she should stop taken them before her surgery.  Please advise.

## 2021-09-24 ENCOUNTER — Telehealth (INDEPENDENT_AMBULATORY_CARE_PROVIDER_SITE_OTHER): Payer: Self-pay | Admitting: Nurse Practitioner

## 2021-09-24 NOTE — Telephone Encounter (Signed)
She can review her results on her MyChart.  If she would like to discuss those results, we can have her come in to see me or GS.

## 2021-09-24 NOTE — Telephone Encounter (Signed)
Ms. Fernandez called again and I explained to her that she could stop taking the blood thinner 5 days before and resume 48 hours after surgery, but she states she wants to see the results of her test. I explained to her that she could make an appointment to see you and get the results then. She then stated that they were having some problems with her daughter being an acholic and was keeping her son. She also said she could not leave him with her husband and with the things going on with her, she would prefer a telephone call.  She also states that you know her well because of the many times she has been here and you would understand.

## 2021-09-29 ENCOUNTER — Ambulatory Visit (INDEPENDENT_AMBULATORY_CARE_PROVIDER_SITE_OTHER): Payer: Medicare Other | Admitting: Nurse Practitioner

## 2021-09-29 ENCOUNTER — Other Ambulatory Visit: Payer: Self-pay

## 2021-09-29 VITALS — BP 171/61 | HR 57 | Ht 68.0 in | Wt 165.0 lb

## 2021-09-29 DIAGNOSIS — I10 Essential (primary) hypertension: Secondary | ICD-10-CM

## 2021-09-29 DIAGNOSIS — I70213 Atherosclerosis of native arteries of extremities with intermittent claudication, bilateral legs: Secondary | ICD-10-CM

## 2021-09-29 DIAGNOSIS — E782 Mixed hyperlipidemia: Secondary | ICD-10-CM | POA: Diagnosis not present

## 2021-10-01 ENCOUNTER — Other Ambulatory Visit: Payer: Self-pay | Admitting: Internal Medicine

## 2021-10-01 DIAGNOSIS — R053 Chronic cough: Secondary | ICD-10-CM
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
## 2021-10-04 ENCOUNTER — Encounter (INDEPENDENT_AMBULATORY_CARE_PROVIDER_SITE_OTHER): Payer: Self-pay | Admitting: Nurse Practitioner

## 2021-10-13 ENCOUNTER — Encounter: Payer: Self-pay | Admitting: Internal Medicine

## 2021-10-13 ENCOUNTER — Other Ambulatory Visit: Payer: Self-pay | Admitting: Internal Medicine

## 2021-10-13 DIAGNOSIS — Z1231 Encounter for screening mammogram for malignant neoplasm of breast: Secondary | ICD-10-CM

## 2021-10-14 ENCOUNTER — Ambulatory Visit: Payer: Medicare Other | Admitting: Certified Registered Nurse Anesthetist

## 2021-10-14 ENCOUNTER — Encounter
Admission: RE | Disposition: A | Discharge status: Home / Self Care | Payer: Self-pay | Source: Home / Self Care | Attending: Internal Medicine

## 2021-10-14 ENCOUNTER — Ambulatory Visit
Admission: RE | Admit: 2021-10-14 | Discharge: 2021-10-14 | Disposition: A | Discharge status: Home / Self Care | Payer: Medicare Other | Attending: Internal Medicine | Admitting: Internal Medicine

## 2021-10-14 DIAGNOSIS — E1151 Type 2 diabetes mellitus with diabetic peripheral angiopathy without gangrene: Secondary | ICD-10-CM | POA: Insufficient documentation

## 2021-10-14 DIAGNOSIS — K591 Functional diarrhea: Secondary | ICD-10-CM | POA: Diagnosis not present

## 2021-10-14 DIAGNOSIS — R195 Other fecal abnormalities: Secondary | ICD-10-CM | POA: Diagnosis not present

## 2021-10-14 DIAGNOSIS — Z951 Presence of aortocoronary bypass graft: Secondary | ICD-10-CM | POA: Insufficient documentation

## 2021-10-14 DIAGNOSIS — Z1211 Encounter for screening for malignant neoplasm of colon: Secondary | ICD-10-CM | POA: Diagnosis present

## 2021-10-14 DIAGNOSIS — I110 Hypertensive heart disease with heart failure: Secondary | ICD-10-CM | POA: Insufficient documentation

## 2021-10-14 DIAGNOSIS — I2510 Atherosclerotic heart disease of native coronary artery without angina pectoris: Secondary | ICD-10-CM | POA: Diagnosis not present

## 2021-10-14 DIAGNOSIS — K640 First degree hemorrhoids: Secondary | ICD-10-CM | POA: Diagnosis not present

## 2021-10-14 DIAGNOSIS — I5032 Chronic diastolic (congestive) heart failure: Secondary | ICD-10-CM | POA: Diagnosis not present

## 2021-10-14 HISTORY — PX: COLONOSCOPY WITH PROPOFOL: SHX5780

## 2021-10-14 LAB — GLUCOSE, CAPILLARY: Glucose-Capillary: 146 mg/dL — ABNORMAL HIGH (ref 70–99)

## 2021-10-14 SURGERY — COLONOSCOPY WITH PROPOFOL
Anesthesia: General

## 2021-10-14 MED ORDER — LIDOCAINE HCL (CARDIAC) PF 100 MG/5ML IV SOSY
PREFILLED_SYRINGE | INTRAVENOUS | Status: DC | PRN
Start: 2021-10-14 — End: 2021-10-14
  Administered 2021-10-14: 50 mg via INTRAVENOUS

## 2021-10-14 MED ORDER — PROPOFOL 500 MG/50ML IV EMUL
INTRAVENOUS | Status: DC | PRN
  Administered 2021-10-14: 140 ug/kg/min via INTRAVENOUS

## 2021-10-14 MED ORDER — PROPOFOL 10 MG/ML IV BOLUS
INTRAVENOUS | Status: AC
  Filled 2021-10-14: qty 20

## 2021-10-14 MED ORDER — PROPOFOL 10 MG/ML IV BOLUS
INTRAVENOUS | Status: DC | PRN
  Administered 2021-10-14: 20 mg via INTRAVENOUS
  Administered 2021-10-14: 40 mg via INTRAVENOUS
  Administered 2021-10-14: 20 mg via INTRAVENOUS
  Administered 2021-10-14: 80 mg via INTRAVENOUS
  Administered 2021-10-14: 40 mg via INTRAVENOUS

## 2021-10-14 MED ORDER — SODIUM CHLORIDE 0.9 % IV SOLN
INTRAVENOUS | Status: DC
Start: 2021-10-14 — End: 2021-10-14
  Administered 2021-10-14: 20 mL/h via INTRAVENOUS
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
## 2021-10-14 NOTE — Anesthesia Postprocedure Evaluation (Signed)
Anesthesia Post Note ? ?Patient: [name] ? ?Procedure(s) Performed: COLONOSCOPY WITH PROPOFOL ? ?Patient location during evaluation: Endoscopy ?Anesthesia Type: General ?Level of consciousness: awake and alert ?Pain management: pain level controlled ?Vital Signs Assessment: post-procedure vital signs reviewed and stable ?Respiratory status: spontaneous breathing, nonlabored ventilation, respiratory function stable and patient connected to nasal cannula oxygen ?Cardiovascular status: blood pressure returned to baseline and stable ?Postop Assessment: no apparent nausea or vomiting ?Anesthetic complications: no ? ? ?No notable events documented. ? ? ?Last Vitals:  ?Vitals:  ? 10/14/21 1140 10/14/21 1150  ?BP: (!) 91/32 (!) 125/46  ?Pulse: (!) 51 (!) 56  ?Resp: 14 16  ?Temp: (!) 35.2 ?C   ?SpO2: 99% 99%  ?  ?Last Pain:  ?Vitals:  ? 10/14/21 1140  ?TempSrc: Temporal  ?PainSc:   ? ? ?  ?  ?  ?  ?  ?  ? ?[name] ? ? ? ? ?

## 2021-10-14 NOTE — Transfer of Care (Signed)
Immediate Anesthesia Transfer of Care Note ? ?Patient: [name] ? ?Procedure(s) Performed: COLONOSCOPY WITH PROPOFOL ? ?Patient Location: Endoscopy Unit ? ?Anesthesia Type:General ? ?Level of Consciousness: drowsy ? ?Airway & Oxygen Therapy: Patient Spontanous Breathing ? ?Post-op Assessment: Report given to RN and Post -op Vital signs reviewed and stable ? ?Post vital signs: Reviewed and stable ? ?Last Vitals:  ?Vitals Value Taken Time  ?BP 95/35   ?Temp    ?Pulse 55 10/14/21 1144  ?Resp 17 10/14/21 1144  ?SpO2 100 % 10/14/21 1144  ?Vitals shown include unvalidated device data. ? ?Last Pain:  ?Vitals:  ? 10/14/21 1045  ?TempSrc: Temporal  ?PainSc: 0-No pain  ?   ? ?  ? ?Complications: No notable events documented. ?

## 2021-10-14 NOTE — Interval H&P Note (Signed)
History and Physical Interval Note: ? ?10/14/2021 ?11:01 AM ? ?[name]  has presented today for surgery, with the diagnosis of + COLOGUARD.  The various methods of treatment have been discussed with the patient and family. After consideration of risks, benefits and other options for treatment, the patient has consented to  Procedure(s) with comments: ?COLONOSCOPY WITH PROPOFOL (N/A) - DM as a surgical intervention.  The patient's history has been reviewed, patient examined, no change in status, stable for surgery.  I have reviewed the patient's chart and labs.  Questions were answered to the patient's satisfaction.   ? ? ?[place], [place] ? ? ?

## 2021-10-14 NOTE — Op Note (Signed)
[facility] ?Gastroenterology ?Patient Name: [name] ?Procedure Date: 10/14/2021 10:56 AM ?MRN: [id number] ?Account #: [id number] ?Date of Birth: 08-08-1951 ?Admit Type: Outpatient ?Age: 71 ?Room: [facility] ENDO ROOM 2 ?Gender: Female ?Note Status: Finalized ?Instrument Name: Colonscope [id number] ?Procedure:             Colonoscopy ?Indications:           Functional diarrhea, Positive Cologuard test ?Providers:             Shields Pautz K. Farrell Broerman MD, MD ?Medicines:             Propofol per Anesthesia ?Complications:         No immediate complications. ?Procedure:             Pre-Anesthesia Assessment: ?                       - The risks and benefits of the procedure and the  ?                       sedation options and risks were discussed with the  ?                       patient. All questions were answered and informed  ?                       consent was obtained. ?                       - Patient identification and proposed procedure were  ?                       verified prior to the procedure by the nurse. The  ?                       procedure was verified in the procedure room. ?                       - ASA Grade Assessment: III - A patient with severe  ?                       systemic disease. ?                       - After reviewing the risks and benefits, the patient  ?                       was deemed in satisfactory condition to undergo the  ?                       procedure. ?                       After obtaining informed consent, the colonoscope was  ?                       passed under direct vision. Throughout the procedure,  ?                       the patient's blood pressure, pulse, and oxygen  ?  saturations were monitored continuously. The  ?                       Colonoscope was introduced through the anus and  ?                       advanced to the the terminal ileum, with  ?                       identification of the appendiceal orifice and IC  ?                        valve. The colonoscopy was performed without  ?                       difficulty. The patient tolerated the procedure well.  ?                       The quality of the bowel preparation was good. The  ?                       terminal ileum, ileocecal valve, appendiceal orifice,  ?                       and rectum were photographed. ?Findings: ?     The perianal and digital rectal examinations were normal. Pertinent  ?     negatives include normal sphincter tone and no palpable rectal lesions. ?     Non-bleeding internal hemorrhoids were found during retroflexion. The  ?     hemorrhoids were Grade I (internal hemorrhoids that do not prolapse). ?     The terminal ileum appeared normal. ?     The colon (entire examined portion) appeared normal. Biopsies for  ?     histology were taken with a cold forceps from the random colon for  ?     evaluation of microscopic colitis. ?Impression:            - Non-bleeding internal hemorrhoids. ?                       - The examined portion of the ileum was normal. ?                       - The entire examined colon is normal. Biopsied. ?Recommendation:        - Patient has a contact number available for  ?                       emergencies. The signs and symptoms of potential  ?                       delayed complications were discussed with the patient.  ?                       Return to normal activities tomorrow. Written  ?                       discharge instructions were provided to the patient. ?                       -  Resume previous diet. ?                       - Continue present medications. ?                       - Resume Plavix (clopidogrel) at prior dose today.  ?                       Refer to managing physician for further adjustment of  ?                       therapy. ?                       - No repeat colonoscopy due to current age (67 years  ?                       or older) and the absence of colonic polyps. ?                       - Return to GI  office in 2 months. ?                       - Telephone GI office to schedule appointment. ?                       - You do NOT require further colon cancer screening  ?                       measures (Annual stool testing (i.e. hemoccult, FIT,  ?                       cologuard), sigmoidoscopy, colonoscopy or CT  ?                       colonography). You should share this recommendation  ?                       with your Primary Care provider. ?                       - The findings and recommendations were discussed with  ?                       the patient. ?Procedure Code(s):     --- Professional --- ?                       [phone], Colonoscopy, flexible; with biopsy, single or  ?                       multiple ?Diagnosis Code(s):     --- Professional --- ?                       R19.5, Other fecal abnormalities ?                       K59.1, Functional diarrhea ?  K64.0, First degree hemorrhoids ?CPT copyright 2019 [employer]. All rights reserved. ?The codes documented in this report are preliminary and upon coder review may  ?be revised to meet current compliance requirements. ?Efrain Sella MD, MD ?10/14/2021 11:41:49 AM ?This report has been signed electronically. ?Number of Addenda: 0 ?Note Initiated On: 10/14/2021 10:56 AM ?Scope Withdrawal Time: 0 hours 10 minutes 8 seconds  ?Total Procedure Duration: 0 hours 14 minutes 9 seconds  ?Estimated Blood Loss:  Estimated blood loss: none. ?     [facility] ?

## 2021-10-14 NOTE — H&P (Signed)
Outpatient short stay form Pre-procedure ?10/14/2021 11:00 AM ?Royce Macadamia K. Norma Fredrickson, M.D. ? ?Primary Physician: Aram Beecham, M.D. ? ?Reason for visit:  Positive fecal dna (Cologuard) test ? ?History of present illness:  Patient is a pleasant 71 y/o female/female presenting on referral from primary care provider for a POSITIVE Cologuard result. Patient denies change in bowel habits, rectal bleeding, weight loss or abdominal pain.    ? ? ? ?Current Facility-Administered Medications:  ?  0.9 %  sodium chloride infusion, , Intravenous, Continuous, [place], Boykin Nearing, MD, Last Rate: 20 mL/hr at 10/14/21 1100, 20 mL/hr at 10/14/21 1100 ? ?Medications Prior to Admission  ?Medication Sig Dispense Refill Last Dose  ? amLODipine (NORVASC) 10 MG tablet Take 1 tablet (10 mg total) by mouth 2 (two) times daily. 30 tablet 1 10/14/2021 at 0945  ? aspirin EC 81 MG tablet Take 81 mg by mouth daily. Swallow whole.   Past Week  ? atenolol (TENORMIN) 100 MG tablet Take 0.5 tablets (50 mg total) by mouth daily. 90 tablet 3 10/13/2021  ? CINNAMON PO Take 1,000 mg by mouth 3 (three) times daily with meals.   Past Week  ? clopidogrel (PLAVIX) 75 MG tablet Take 1 tablet (75 mg total) by mouth daily. 30 tablet 11 Past Week  ? diphenoxylate-atropine (LOMOTIL) 2.5-0.025 MG tablet Take 1 tablet by mouth 4 (four) times daily as needed for diarrhea or loose stools.   10/13/2021  ? Dulaglutide 1.5 MG/0.5ML SOPN Inject into the skin once a week. Takes on Thursday   10/13/2021  ? Ensure Max Protein (ENSURE MAX PROTEIN) LIQD Take 330 mLs (11 oz total) by mouth 2 (two) times daily. 1000 mL 0 10/13/2021  ? furosemide (LASIX) 20 MG tablet Take 1 tablet (20 mg total) by mouth daily as needed for fluid or edema. 30 tablet 4 10/13/2021  ? GLIPIZIDE XL 10 MG 24 hr tablet Take 10 mg by mouth daily with breakfast.    10/13/2021  ? guaiFENesin (MUCINEX) 600 MG 12 hr tablet Take by mouth 2 (two) times daily as needed.   10/13/2021  ? hydrALAZINE (APRESOLINE) 25 MG tablet Take 25 mg  by mouth 2 (two) times daily.   10/13/2021  ? Multiple Vitamin (MULTIVITAMIN WITH MINERALS) TABS tablet Take 1 tablet by mouth daily. 90 tablet 0 10/13/2021  ? nutrition supplement, JUVEN, (JUVEN) PACK Take 1 packet by mouth 2 (two) times daily between meals. 30 packet 1 10/13/2021  ? nystatin cream (MYCOSTATIN) Apply 1 application topically 3 (three) times daily. 30 g 0 10/13/2021  ? polyethylene glycol (MIRALAX / GLYCOLAX) 17 g packet Take 17 g by mouth daily as needed for mild constipation or moderate constipation. 14 each 0 10/13/2021  ? potassium chloride (KLOR-CON) 10 MEQ tablet Take 1 tablet (10 meq) by mouth twice daily as needed when taking furosemide 60 tablet 4 10/13/2021  ? QUEtiapine (SEROQUEL) 50 MG tablet TAKE 1 AND 1/2 TABLETS BY MOUTH NIGHTLY AS DIRECTED   10/13/2021  ? rosuvastatin (CRESTOR) 20 MG tablet Take 1 tablet (20 mg total) by mouth daily. (Patient taking differently: Take 40 mg by mouth daily.) 90 tablet 3 10/13/2021  ? senna-docusate (SENOKOT-S) 8.6-50 MG tablet Take 1 tablet by mouth 2 (two) times daily. 30 tablet 1 10/13/2021  ? Sod Picosulfate-Mag Ox-Cit Acd (CLENPIQ) 10-3.5-12 MG-GM -GM/160ML SOLN Take 160 mLs by mouth.   10/13/2021  ? nitroGLYCERIN (NITROSTAT) 0.4 MG SL tablet Place 1 tablet (0.4 mg total) under the tongue every 5 (five) minutes as needed  for chest pain. 25 tablet 3   ? traMADol (ULTRAM) 50 MG tablet Take 1 tablet (50 mg total) by mouth every 6 (six) hours as needed (mild pain). (Patient not taking: Reported on 09/29/2021) 30 tablet 0   ? XARELTO 2.5 MG TABS tablet TAKE 1 TABLET BY MOUTH TWICE DAILY (Patient not taking: Reported on 10/14/2021) 60 tablet 1 Not Taking  ? ? ? ?Allergies  ?Allergen Reactions  ? Metformin And Related Diarrhea  ? Oxycodone Hcl Nausea And Vomiting  ?  Can still take this for pain medicine  ? Other Cough  ? Atorvastatin Cough  ? Biaxin [Clarithromycin] Itching  ? Codeine Nausea Only and Other (See Comments)  ?  Constipates; doesn't like the way it makes her feel ?   ? Isosorbide Itching and Cough  ? Losartan Itching and Cough  ? Zetia [Ezetimibe] Diarrhea  ? ? ? ?Past Medical History:  ?Diagnosis Date  ? Acute on chronic diastolic CHF (congestive heart failure) ([place]) 01/24/2015  ? Anemia   ? Anxiety   ? Atrial fibrillation ([place])   ? Complication of anesthesia   ? difficult to induce sleep  ? Coronary artery disease   ? Status post coronary stenting approximately 10 years ago  ? Depression   ? Dysrhythmia 2019  ? atrial fibrillation  ? Hyperlipidemia   ? Hypertension   ? Peripheral artery disease ([place])   ? Pulmonary embolism ([place]) 01/08/2015  ? s/p cabg  ? Type 2 diabetes mellitus ([place])   ? ? ?Review of systems:  Otherwise negative.  ? ? ?Physical Exam ? ?Gen: Alert, oriented. Appears stated age.  ?HEENT: [place]/AT. PERRLA. ?Lungs: CTA, no wheezes. ?CV: RR nl S1, S2. ?Abd: soft, benign, no masses. BS+ ?Ext: No edema. Pulses 2+ ? ? ? ?Planned procedures: Proceed with colonoscopy. The patient understands the nature of the planned procedure, indications, risks, alternatives and potential complications including but not limited to bleeding, infection, perforation, damage to internal organs and possible oversedation/side effects from anesthesia. The patient agrees and gives consent to proceed.  ?Please refer to procedure notes for findings, recommendations and patient disposition/instructions.  ? ? ? ?Avory Mimbs K. Alice Reichert, M.D. ?Gastroenterology ?10/14/2021  11:00 AM ? ? ? ? ? ? ?

## 2021-10-14 NOTE — Anesthesia Procedure Notes (Signed)
Date/Time: 10/14/2021 11:16 AM ?Performed by: Joanette Gula, Ladaja Yusupov, CRNA ?Pre-anesthesia Checklist: Patient identified, Emergency Drugs available, Patient being monitored, Timeout performed and Suction available ?Patient Re-evaluated:Patient Re-evaluated prior to induction ?Oxygen Delivery Method: Nasal cannula ?Induction Type: IV induction ? ? ? ? ?

## 2021-10-15 ENCOUNTER — Encounter: Payer: Self-pay | Admitting: Internal Medicine

## 2021-10-15 ENCOUNTER — Other Ambulatory Visit (INDEPENDENT_AMBULATORY_CARE_PROVIDER_SITE_OTHER): Payer: Self-pay | Admitting: Nurse Practitioner

## 2021-10-15 LAB — SURGICAL PATHOLOGY

## 2021-11-02 ENCOUNTER — Ambulatory Visit (INDEPENDENT_AMBULATORY_CARE_PROVIDER_SITE_OTHER): Payer: Medicare Other | Admitting: Vascular Surgery

## 2021-11-02 ENCOUNTER — Encounter (INDEPENDENT_AMBULATORY_CARE_PROVIDER_SITE_OTHER): Payer: Medicare Other

## 2021-12-29 ENCOUNTER — Ambulatory Visit: Payer: Medicare Other

## 2022-01-20 ENCOUNTER — Telehealth (INDEPENDENT_AMBULATORY_CARE_PROVIDER_SITE_OTHER): Payer: Self-pay | Admitting: Vascular Surgery

## 2022-01-20 NOTE — Telephone Encounter (Signed)
Bring her in with ABIs

## 2022-01-20 NOTE — Telephone Encounter (Signed)
Patient called and stated Dr. Excell Seltzer, her podiatrist said she has gain green in the third toe of the left foot and wanted to make sure the blood flow is still good.  She wants to make an appointment with Dr. [name] to have an ultrasound to check the blood flow.

## 2022-02-02 ENCOUNTER — Encounter (INDEPENDENT_AMBULATORY_CARE_PROVIDER_SITE_OTHER): Payer: Self-pay | Admitting: Nurse Practitioner

## 2022-02-02 ENCOUNTER — Ambulatory Visit (INDEPENDENT_AMBULATORY_CARE_PROVIDER_SITE_OTHER): Payer: Medicare Other | Admitting: Nurse Practitioner

## 2022-02-02 ENCOUNTER — Other Ambulatory Visit (INDEPENDENT_AMBULATORY_CARE_PROVIDER_SITE_OTHER): Payer: Self-pay | Admitting: Nurse Practitioner

## 2022-02-02 ENCOUNTER — Ambulatory Visit (INDEPENDENT_AMBULATORY_CARE_PROVIDER_SITE_OTHER): Payer: Medicare Other

## 2022-02-02 VITALS — BP 167/71 | HR 53 | Resp 12 | Ht 68.0 in | Wt 170.0 lb

## 2022-02-02 DIAGNOSIS — E782 Mixed hyperlipidemia: Secondary | ICD-10-CM

## 2022-02-02 DIAGNOSIS — I96 Gangrene, not elsewhere classified: Secondary | ICD-10-CM | POA: Diagnosis not present

## 2022-02-02 DIAGNOSIS — I10 Essential (primary) hypertension: Secondary | ICD-10-CM

## 2022-02-03 ENCOUNTER — Telehealth (INDEPENDENT_AMBULATORY_CARE_PROVIDER_SITE_OTHER): Payer: Self-pay

## 2022-02-03 NOTE — Telephone Encounter (Addendum)
I attempted to contact the patient to schedule a LLE angio with Dr. [name] on 02/04/22. A message was left for a return call. Patient called back and has a 12:00 pm arrival time to the MM. Pre-procedure instructions were discussed and patient understood.

## 2022-02-04 ENCOUNTER — Other Ambulatory Visit: Payer: Self-pay

## 2022-02-04 ENCOUNTER — Encounter: Payer: Self-pay | Admitting: Vascular Surgery

## 2022-02-04 ENCOUNTER — Ambulatory Visit: Payer: Medicare Other | Admitting: Anesthesiology

## 2022-02-04 ENCOUNTER — Encounter
Admission: RE | Disposition: A | Discharge status: Home / Self Care | Payer: Self-pay | Source: Home / Self Care | Attending: Vascular Surgery

## 2022-02-04 ENCOUNTER — Encounter (INDEPENDENT_AMBULATORY_CARE_PROVIDER_SITE_OTHER): Payer: Self-pay | Admitting: Nurse Practitioner

## 2022-02-04 ENCOUNTER — Ambulatory Visit
Admission: RE | Admit: 2022-02-04 | Discharge: 2022-02-04 | Disposition: A | Discharge status: Home / Self Care | Payer: Medicare Other | Attending: Vascular Surgery | Admitting: Vascular Surgery

## 2022-02-04 DIAGNOSIS — I5033 Acute on chronic diastolic (congestive) heart failure: Secondary | ICD-10-CM | POA: Diagnosis not present

## 2022-02-04 DIAGNOSIS — L97529 Non-pressure chronic ulcer of other part of left foot with unspecified severity: Secondary | ICD-10-CM | POA: Diagnosis not present

## 2022-02-04 DIAGNOSIS — E782 Mixed hyperlipidemia: Secondary | ICD-10-CM | POA: Diagnosis not present

## 2022-02-04 DIAGNOSIS — M86172 Other acute osteomyelitis, left ankle and foot: Secondary | ICD-10-CM

## 2022-02-04 DIAGNOSIS — I70245 Atherosclerosis of native arteries of left leg with ulceration of other part of foot: Secondary | ICD-10-CM | POA: Diagnosis not present

## 2022-02-04 DIAGNOSIS — I110 Hypertensive heart disease with heart failure: Secondary | ICD-10-CM | POA: Insufficient documentation

## 2022-02-04 DIAGNOSIS — I70262 Atherosclerosis of native arteries of extremities with gangrene, left leg: Secondary | ICD-10-CM | POA: Insufficient documentation

## 2022-02-04 DIAGNOSIS — E11621 Type 2 diabetes mellitus with foot ulcer: Secondary | ICD-10-CM | POA: Diagnosis not present

## 2022-02-04 DIAGNOSIS — E1169 Type 2 diabetes mellitus with other specified complication: Secondary | ICD-10-CM | POA: Insufficient documentation

## 2022-02-04 DIAGNOSIS — M869 Osteomyelitis, unspecified: Secondary | ICD-10-CM | POA: Insufficient documentation

## 2022-02-04 DIAGNOSIS — I743 Embolism and thrombosis of arteries of the lower extremities: Secondary | ICD-10-CM

## 2022-02-04 DIAGNOSIS — E1152 Type 2 diabetes mellitus with diabetic peripheral angiopathy with gangrene: Secondary | ICD-10-CM | POA: Insufficient documentation

## 2022-02-04 DIAGNOSIS — I70299 Other atherosclerosis of native arteries of extremities, unspecified extremity: Secondary | ICD-10-CM

## 2022-02-04 HISTORY — PX: LOWER EXTREMITY ANGIOGRAPHY: CATH118251

## 2022-02-04 LAB — GLUCOSE, CAPILLARY
Glucose-Capillary: 158 mg/dL — ABNORMAL HIGH (ref 70–99)
Glucose-Capillary: 58 mg/dL — ABNORMAL LOW (ref 70–99)
Glucose-Capillary: 61 mg/dL — ABNORMAL LOW (ref 70–99)
Glucose-Capillary: 81 mg/dL (ref 70–99)
Glucose-Capillary: 84 mg/dL (ref 70–99)

## 2022-02-04 LAB — CREATININE, SERUM
Creatinine, Ser: 0.73 mg/dL (ref 0.44–1.00)
GFR, Estimated: >60 mL/min (ref >60)

## 2022-02-04 LAB — BUN: BUN: 25 mg/dL — ABNORMAL HIGH (ref 8–23)

## 2022-02-04 SURGERY — LOWER EXTREMITY ANGIOGRAPHY
Anesthesia: General | Laterality: Left

## 2022-02-04 MED ORDER — ONDANSETRON HCL 4 MG/2ML IJ SOLN
INTRAMUSCULAR | Status: DC | PRN
  Administered 2022-02-04: 4 mg via INTRAVENOUS

## 2022-02-04 MED ORDER — METHYLPREDNISOLONE SODIUM SUCC 125 MG IJ SOLR
125.0000 mg | Freq: Once | INTRAMUSCULAR | Status: DC | PRN
Start: 2022-02-04 — End: 2022-02-04

## 2022-02-04 MED ORDER — GLYCOPYRROLATE 0.2 MG/ML IJ SOLN
INTRAMUSCULAR | Status: AC
  Filled 2022-02-04: qty 1

## 2022-02-04 MED ORDER — EPHEDRINE 5 MG/ML INJ
INTRAVENOUS | Status: AC
  Filled 2022-02-04: qty 10

## 2022-02-04 MED ORDER — IODIXANOL 320 MG/ML IV SOLN
INTRAVENOUS | Status: DC | PRN
  Administered 2022-02-04: 85 mL

## 2022-02-04 MED ORDER — SODIUM CHLORIDE 0.9 % IV SOLN: INTRAVENOUS | Status: DC

## 2022-02-04 MED ORDER — ONDANSETRON HCL 4 MG/2ML IJ SOLN: 4.0000 mg | Freq: Four times a day (QID) | INTRAMUSCULAR | Status: DC | PRN

## 2022-02-04 MED ORDER — DEXTROSE 50 % IV SOLN: 25.0000 mL | Freq: Once | INTRAVENOUS | Status: AC

## 2022-02-04 MED ORDER — PHENYLEPHRINE HCL-NACL 20-0.9 MG/250ML-% IV SOLN
INTRAVENOUS | Status: AC
  Filled 2022-02-04: qty 250

## 2022-02-04 MED ORDER — LIDOCAINE HCL (PF) 2 % IJ SOLN
INTRAMUSCULAR | Status: AC
  Filled 2022-02-04: qty 5

## 2022-02-04 MED ORDER — HEPARIN SODIUM (PORCINE) 1000 UNIT/ML IJ SOLN
INTRAMUSCULAR | Status: DC | PRN
  Administered 2022-02-04: 5000 [IU] via INTRAVENOUS

## 2022-02-04 MED ORDER — DEXTROSE 50 % IV SOLN
1.0000 | Freq: Once | INTRAVENOUS | Status: AC
  Administered 2022-02-04: 50 mL via INTRAVENOUS

## 2022-02-04 MED ORDER — HYDRALAZINE HCL 20 MG/ML IJ SOLN: 5.0000 mg | INTRAMUSCULAR | Status: DC | PRN

## 2022-02-04 MED ORDER — ACETAMINOPHEN 325 MG PO TABS: 650.0000 mg | ORAL_TABLET | ORAL | Status: DC | PRN

## 2022-02-04 MED ORDER — DEXAMETHASONE SODIUM PHOSPHATE 10 MG/ML IJ SOLN
INTRAMUSCULAR | Status: AC
  Filled 2022-02-04: qty 1

## 2022-02-04 MED ORDER — DIPHENHYDRAMINE HCL 50 MG/ML IJ SOLN: 50.0000 mg | Freq: Once | INTRAMUSCULAR | Status: DC | PRN

## 2022-02-04 MED ORDER — EPHEDRINE SULFATE (PRESSORS) 50 MG/ML IJ SOLN
INTRAMUSCULAR | Status: DC | PRN
  Administered 2022-02-04 (×3): 10 mg via INTRAVENOUS
  Administered 2022-02-04 (×4): 5 mg via INTRAVENOUS

## 2022-02-04 MED ORDER — HYDROMORPHONE HCL 1 MG/ML IJ SOLN: 1.0000 mg | Freq: Once | INTRAMUSCULAR | Status: DC | PRN

## 2022-02-04 MED ORDER — FAMOTIDINE 20 MG PO TABS: 40.0000 mg | ORAL_TABLET | Freq: Once | ORAL | Status: DC | PRN

## 2022-02-04 MED ORDER — HYDRALAZINE HCL 20 MG/ML IJ SOLN: 10.0000 mg | Freq: Once | INTRAMUSCULAR | Status: AC

## 2022-02-04 MED ORDER — MIDAZOLAM HCL 2 MG/2ML IJ SOLN
INTRAMUSCULAR | Status: AC
  Filled 2022-02-04: qty 2

## 2022-02-04 MED ORDER — SODIUM CHLORIDE 0.9% FLUSH: 3.0000 mL | Freq: Two times a day (BID) | INTRAVENOUS | Status: DC

## 2022-02-04 MED ORDER — PHENYLEPHRINE HCL-NACL 20-0.9 MG/250ML-% IV SOLN
INTRAVENOUS | Status: DC | PRN
  Administered 2022-02-04: 25 ug/min via INTRAVENOUS

## 2022-02-04 MED ORDER — PHENYLEPHRINE HCL (PRESSORS) 10 MG/ML IV SOLN
INTRAVENOUS | Status: DC | PRN
  Administered 2022-02-04 (×3): 80 ug via INTRAVENOUS
  Administered 2022-02-04: 160 ug via INTRAVENOUS
  Administered 2022-02-04: 80 ug via INTRAVENOUS
  Administered 2022-02-04: 200 ug via INTRAVENOUS
  Administered 2022-02-04: 120 ug via INTRAVENOUS

## 2022-02-04 MED ORDER — CEFAZOLIN SODIUM-DEXTROSE 2-4 GM/100ML-% IV SOLN
2.0000 g | INTRAVENOUS | Status: AC
  Administered 2022-02-04: 2 g via INTRAVENOUS

## 2022-02-04 MED ORDER — LABETALOL HCL 5 MG/ML IV SOLN: 10.0000 mg | INTRAVENOUS | Status: DC | PRN

## 2022-02-04 MED ORDER — PHENYLEPHRINE 80 MCG/ML (10ML) SYRINGE FOR IV PUSH (FOR BLOOD PRESSURE SUPPORT)
PREFILLED_SYRINGE | INTRAVENOUS | Status: AC
  Filled 2022-02-04: qty 10

## 2022-02-04 MED ORDER — GLYCOPYRROLATE 0.2 MG/ML IJ SOLN
INTRAMUSCULAR | Status: DC | PRN
  Administered 2022-02-04: .2 mg via INTRAVENOUS

## 2022-02-04 MED ORDER — HYDROCODONE-ACETAMINOPHEN 5-325 MG PO TABS: 1.0000 | ORAL_TABLET | Freq: Four times a day (QID) | ORAL | Status: DC | PRN

## 2022-02-04 MED ORDER — KETAMINE HCL 10 MG/ML IJ SOLN
INTRAMUSCULAR | Status: DC | PRN
  Administered 2022-02-04: 20 mg via INTRAVENOUS
  Administered 2022-02-04: 30 mg via INTRAVENOUS

## 2022-02-04 MED ORDER — MIDAZOLAM HCL 2 MG/ML PO SYRP: 8.0000 mg | ORAL_SOLUTION | Freq: Once | ORAL | Status: DC | PRN

## 2022-02-04 MED ORDER — MIDAZOLAM HCL 2 MG/2ML IJ SOLN
INTRAMUSCULAR | Status: DC | PRN
  Administered 2022-02-04: 2 mg via INTRAVENOUS

## 2022-02-04 MED ORDER — SODIUM CHLORIDE 0.9% FLUSH: 3.0000 mL | INTRAVENOUS | Status: DC | PRN

## 2022-02-04 MED ORDER — MORPHINE SULFATE (PF) 4 MG/ML IV SOLN: 2.0000 mg | INTRAVENOUS | Status: DC | PRN

## 2022-02-04 MED ORDER — ONDANSETRON HCL 4 MG/2ML IJ SOLN
INTRAMUSCULAR | Status: AC
  Filled 2022-02-04: qty 2

## 2022-02-04 MED ORDER — FENTANYL CITRATE (PF) 100 MCG/2ML IJ SOLN: 25.0000 ug | INTRAMUSCULAR | Status: DC | PRN

## 2022-02-04 MED ORDER — LIDOCAINE HCL (CARDIAC) PF 100 MG/5ML IV SOSY
PREFILLED_SYRINGE | INTRAVENOUS | Status: DC | PRN
  Administered 2022-02-04: 100 mg via INTRAVENOUS

## 2022-02-04 MED ORDER — DEXTROSE 50 % IV SOLN
INTRAVENOUS | Status: AC
  Filled 2022-02-04: qty 50

## 2022-02-04 MED ORDER — DEXAMETHASONE SODIUM PHOSPHATE 10 MG/ML IJ SOLN
INTRAMUSCULAR | Status: DC | PRN
  Administered 2022-02-04: 4 mg via INTRAVENOUS

## 2022-02-04 MED ORDER — HEPARIN SODIUM (PORCINE) 1000 UNIT/ML IJ SOLN
INTRAMUSCULAR | Status: AC
  Filled 2022-02-04: qty 10

## 2022-02-04 MED ORDER — SODIUM CHLORIDE 0.9 % IV SOLN: 250.0000 mL | INTRAVENOUS | Status: DC | PRN

## 2022-02-04 MED ORDER — KETAMINE HCL 50 MG/5ML IJ SOSY
PREFILLED_SYRINGE | INTRAMUSCULAR | Status: AC
  Filled 2022-02-04: qty 5

## 2022-02-04 MED ORDER — DEXTROSE 50 % IV SOLN
INTRAVENOUS | Status: AC
  Administered 2022-02-04: 25 mL via INTRAVENOUS
  Filled 2022-02-04: qty 50

## 2022-02-04 MED ORDER — PROPOFOL 10 MG/ML IV BOLUS
INTRAVENOUS | Status: DC | PRN
  Administered 2022-02-04: 180 mg via INTRAVENOUS

## 2022-02-04 MED ORDER — HYDRALAZINE HCL 20 MG/ML IJ SOLN
INTRAMUSCULAR | Status: AC
  Filled 2022-02-04: qty 1

## 2022-02-04 SURGICAL SUPPLY — 26 items
BALLN ARMADA 1.5X120X150 (BALLOONS) ×2
BALLN ARMADA 2.0X200X150 (BALLOONS) ×4
BALLN LUTONIX 018 4X100X130 (BALLOONS) ×2
BALLOON ARMADA 1.5X120X150 (BALLOONS) IMPLANT
BALLOON ARMADA 2.0X200X150 (BALLOONS) IMPLANT
BALLOON LUTONIX 018 4X100X130 (BALLOONS) IMPLANT
CATH ANGIO 5F PIGTAIL 65CM (CATHETERS) ×1 IMPLANT
CATH NAVICROSS ANGLED 135CM (MICROCATHETER) ×1 IMPLANT
CATH ROTAREX 135 6FR (CATHETERS) ×1 IMPLANT
CATH SEEKER .018X150 (CATHETERS) ×1 IMPLANT
CATH VERT 5FR 125CM (CATHETERS) ×1 IMPLANT
COVER PROBE U/S 5X48 (MISCELLANEOUS) ×1 IMPLANT
DEVICE STARCLOSE SE CLOSURE (Vascular Products) ×1 IMPLANT
GLIDEWIRE ADV .014X300CM (WIRE) ×1 IMPLANT
GLIDEWIRE ADV .035X260CM (WIRE) ×1 IMPLANT
KIT ENCORE 26 ADVANTAGE (KITS) ×1 IMPLANT
NDL ENTRY 21GA 7CM ECHOTIP (NEEDLE) IMPLANT
NEEDLE ENTRY 21GA 7CM ECHOTIP (NEEDLE) ×2 IMPLANT
PACK ANGIOGRAPHY (CUSTOM PROCEDURE TRAY) ×3 IMPLANT
SET INTRO CAPELLA COAXIAL (SET/KITS/TRAYS/PACK) ×1 IMPLANT
SHEATH BRITE TIP 5FRX11 (SHEATH) ×1 IMPLANT
SHEATH RAABE 6FRX70 (SHEATH) ×1 IMPLANT
SYR MEDRAD MARK 7 150ML (SYRINGE) ×1 IMPLANT
TUBING CONTRAST HIGH PRESS 72 (TUBING) ×1 IMPLANT
WIRE G V18X300CM (WIRE) ×1 IMPLANT
WIRE GUIDERIGHT .035X150 (WIRE) ×1 IMPLANT
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
## 2022-02-04 NOTE — Transfer of Care (Signed)
Immediate Anesthesia Transfer of Care Note  Patient: [name]  Procedure(s) Performed: Lower Extremity Angiography (Left)  Patient Location: PACU  Anesthesia Type:General  Level of Consciousness: drowsy  Airway & Oxygen Therapy: Patient Spontanous Breathing and Patient connected to face mask oxygen  Post-op Assessment: Report given to RN and Post -op Vital signs reviewed and stable  Post vital signs: Reviewed and stable  Last Vitals:  Vitals Value Taken Time  BP 163/61 02/04/22 1556  Temp    Pulse 63 02/04/22 1600  Resp 25 02/04/22 1600  SpO2 100 % 02/04/22 1600  Vitals shown include unvalidated device data.  Last Pain:  Vitals:   02/04/22 1223  TempSrc: Oral  PainSc: 0-No pain         Complications:  Encounter Notable Events  Notable Event Outcome Phase Comment  None  Intraprocedure

## 2022-02-04 NOTE — Interval H&P Note (Signed)
History and Physical Interval Note:  02/04/2022 12:23 PM  [name]  has presented today for surgery, with the diagnosis of LLE Angiography   ASO with ulceration   ANESTHESIA  okd by Inda Merlin Rep.  The various methods of treatment have been discussed with the patient and family. After consideration of risks, benefits and other options for treatment, the patient has consented to  Procedure(s): Lower Extremity Angiography (Left) as a surgical intervention.  The patient's history has been reviewed, patient examined, no change in status, stable for surgery.  I have reviewed the patient's chart and labs.  Questions were answered to the patient's satisfaction.     [name]
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
## 2022-02-04 NOTE — Op Note (Signed)
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  Post-operative diagnosis:  Same  Procedure(s) Performed:             1.  Introduction catheter into left lower extremity 3rd order catheter placement with contrast injection left lower extremity for distal runoff             2.   Mechanical thrombectomy of the popliteal artery using the Rota Rex catheter             3.  Percutaneous transluminal angioplasty to 4 mm left popliteal arteries.             4.  Percutaneous transluminal angioplasty left posterior tibial to 2 mm             5.  Star close closure right common femoral arteriotomy  Anesthesia: General anesthesia by LMA  Sheath: 6 French Rabie sheath right common femoral retrograde  Contrast: 85 cc  Fluoroscopy Time: 15.9 minutes  Indications:  [name] presents with increasing pain of the left lower extremity.  This is associated with ulceration of the forefoot and osteomyelitis.  This suggests the patient is having limb threatening ischemia. The risks and benefits are reviewed all questions answered patient agrees to proceed.  Procedure:   [name] is a 71 y.o. y.o. female who was identified and appropriate procedural time out was performed.  The patient was then placed supine on the table and prepped and draped in the usual sterile fashion.    Ultrasound was placed in the sterile sleeve and the right groin was evaluated the right common femoral artery was echolucent and pulsatile indicating patency.  Image was recorded for the permanent record and under real-time visualization a microneedle was inserted into the common femoral artery followed by the microwire and then the micro-sheath.  A  J-wire was then advanced through the micro-sheath and a  5 [place] sheath was then inserted over a J-wire. J-wire was then advanced and a 5 French pigtail catheter was positioned at the level of T12.  AP projection of the aorta was then obtained. Pigtail catheter was repositioned to above the bifurcation and a RAO view of the pelvis was obtained.  Subsequently a pigtail catheter with an Advantage wire was used to cross the aortic bifurcation.  The catheter and wire were advanced down into the left distal external iliac artery. Oblique view of the femoral bifurcation was then obtained and subsequently the wire was reintroduced and the pigtail catheter negotiated into the SFA representing third order catheter placement. Distal runoff was then performed.  5000 units of heparin was then given and allowed to circulate for several minutes.  A 6 French Rabie sheath was advanced up and over the bifurcation and positioned in the mid left superficial femoral artery.  KMP catheter and advantage Glidewire were then negotiated down into the distal popliteal through the 2 greater than 90% stenoses. Catheter was then advanced. Hand injection contrast demonstrated the tibial anatomy in further detail.  A V18 wire was then advanced through the catheter and the catheter removed.  A Rota Rex thrombectomy catheter is then prepped on the field.  I felt the [place] Rex catheter was appropriate given her more recent intervention this lesion is likely associated with thrombus and  not atherosclerosis.  A total of 3 passes were made with the [place] Rex catheter across the mid and distal popliteal lesions.  Follow-up imaging now demonstrated a huge increase in luminal gain but there was still approximately 50% residual stenosis.  A 4 mm x 100 mm Lutonix drug-eluting balloon was used to angioplasty the popliteal artery.  The inflation was for 1 minute at 12 atm. Follow-up imaging demonstrated patency with less than 10% residual stenosis.  The  detector was then positioned distally.  And the Kumpe catheter was used to negotiate the V18 wire into the posterior tibial artery.  I was able to get the wire all the way down past the level of the malleolus.  A 2 mm x 200 mm Ultraverse balloon was advanced into the proximal posterior tibial essentially from the level of the medial malleolus proximally.  2 separate inflations were required.  Inflation was to 12 atmospheres for 1 minute.  Next I moved forward with advancing a Ferd Hibbs cross catheter and then a seeker catheter with a 0.014 advantage wire was able to get down into the plantar artery.  I then used a 1.5 mm balloon from the plantar arteries proximally inflation was to 10 atm for 1 minute.  I then used a 2 mm balloon for second inflation in the same area.  Follow-up imaging demonstrated patency with less than 10% residual stenosis. Distal runoff was then reassessed and noted to be widely patent.    After review of these images the sheath is pulled into the right external iliac oblique of the common femoral is obtained and a Star close device deployed. There no immediate Complications.  Findings:  The abdominal aorta is opacified with a bolus injection contrast. Renal arteries are single and widely patent without evidence of hemodynamically significant stenosis.  The aorta itself has diffuse disease but no hemodynamically significant lesions. The common and external iliac arteries are widely patent bilaterally.  The left common femoral is widely patent as is the profunda femoris.  The SFA does has diffuse disease but there are no hemodynamically significant lesions.  The above-knee popliteal again has diffuse disease but no hemodynamically significant lesions.  The mid popliteal has a greater than 90% stenosis that extends over 10 to 15 mm.  And then the distal popliteal demonstrates a lesion that is greater than 80% extending over 25 to 30 mm in length.  The trifurcation is heavily diseased with  occlusion of the anterior tibial.  The tibioperoneal trunk and peroneal demonstrates mild disease but there are no hemodynamically significant lesions.  The previously noted lesion in the peroneal remains widely patent.  The posterior tibial demonstrates occlusion in its proximal portion and anterior tibial demonstrates occlusion throughout its course as well  Following angioplasty posterior tibial now is in-line flow and looks quite nice.  Thrombectomy and angioplasty of the mid and distal popliteal yields an excellent result with less than 10% residual stenosis.  Summary: Successful recanalization left lower extremity for limb salvage                           Disposition: Patient was taken to the recovery room in stable condition having tolerated the procedure well.  Belenda Cruise  02/04/2022,3:52 PM

## 2022-02-05 ENCOUNTER — Encounter: Payer: Self-pay | Admitting: Vascular Surgery

## 2022-02-05 LAB — GLUCOSE, CAPILLARY
Glucose-Capillary: 49 mg/dL — ABNORMAL LOW (ref 70–99)
Glucose-Capillary: 90 mg/dL (ref 70–99)

## 2022-02-08 ENCOUNTER — Telehealth: Payer: Self-pay | Admitting: Cardiovascular Disease

## 2022-02-08 NOTE — Telephone Encounter (Signed)
Our office was just informed today at 4:40 pm the pt is having surgery on 02/12/22. Per pre op provider the pt will need in office appt. I called the pt and left message to call the office back ASAP on Wed 02/10/22 for appt. I was going to offer appt with DOD on 02/10/22.Office is closed tomorrow for July 4th. I will send FYI to requesting office the pt is going to need an appt.

## 2022-02-08 NOTE — Telephone Encounter (Signed)
   Name: [name]  DOB: 1951-02-28  MRN: [id number]  Primary Cardiologist: Julien Nordmann, MD  Chart reviewed as part of pre-operative protocol coverage. Because of [name]'s past medical history and time since last visit, she will require a follow-up in-office visit in order to better assess preoperative cardiovascular risk.  Pre-op covering staff: - Please schedule appointment and call patient to inform them. If patient already had an upcoming appointment within acceptable timeframe, please add "pre-op clearance" to the appointment notes so provider is aware. - Please contact requesting surgeon's office via preferred method (i.e, phone, fax) to inform them of need for appointment prior to surgery.   Joylene Grapes, NP  02/08/2022, 4:51 PM

## 2022-02-08 NOTE — Telephone Encounter (Signed)
   Pre-operative Risk Assessment    Patient Name: [name]  DOB: [birth year] MRN: [id number]      Request for Surgical Clearance    Procedure:   toe amputation   Date of Surgery:  Clearance 02/12/22                                 Surgeon:  Dr. Gwyneth Revels Surgeon's Group or Practice Name:  [facility]  Phone number:  [phone] Fax number:  [phone]   Type of Clearance Requested:   - Medical    Type of Anesthesia:  Local    Additional requests/questions:    Signed, Damaris Schooner   02/08/2022, 4:40 PM

## 2022-02-09 NOTE — Anesthesia Postprocedure Evaluation (Signed)
Anesthesia Post Note  Patient: Chester Romero Babic  Procedure(s) Performed: Lower Extremity Angiography (Left)  Patient location during evaluation: PACU Anesthesia Type: General Level of consciousness: awake and alert Pain management: pain level controlled Vital Signs Assessment: post-procedure vital signs reviewed and stable Respiratory status: spontaneous breathing, nonlabored ventilation, respiratory function stable and patient connected to nasal cannula oxygen Cardiovascular status: blood pressure returned to baseline and stable Postop Assessment: no apparent nausea or vomiting Anesthetic complications: no   Encounter Notable Events  Notable Event Outcome Phase Comment  None  Intraprocedure      Last Vitals:  Vitals:   02/04/22 1730 02/04/22 1800  BP: (!) 152/95 (!) 154/62  Pulse: 71 71  Resp: 16 18  Temp:    SpO2: 94% 98%    Last Pain:  Vitals:   02/04/22 1800  TempSrc:   PainSc: 0-No pain                 Lenard Simmer

## 2022-02-10 ENCOUNTER — Other Ambulatory Visit: Payer: Self-pay | Admitting: Podiatry
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
## 2022-02-10 NOTE — Telephone Encounter (Signed)
I will send an FYI to requesting office the pt needs an IN OFFICE appt . Pt last seen by cardiology 10/2020. I left a message on Monday 02/08/22 for the pt to call back for appt.

## 2022-02-10 NOTE — Telephone Encounter (Signed)
I s/w Jessica with at Dr. Irene Limbo office. I explained that the pt is going to need an IN OFFICE appt as she was last seen 10/2020. I informed Shanda Bumps that I will hold a time slot for tomorrow at our [facility]. Location [address]. Appt 02/11/22 @ 2:15 with Jari Favre, PAC.   I stated that I had left a message Monday for the pt to call back for an appt. I called the pt again today and left vm again. I left a very detailed vm of the tentative appt 02/11/22 @ 2:15 at [facility]. Location.   I then called Shanda Bumps back and informed her that we still have not been able to reach the pt. Shanda Bumps said she will try to reach the pt and will call me back. Shanda Bumps, did call back and said she was able to reach the pt and the pt is agreeable to appt date and time and location. I assured Shanda Bumps that I will send FYI with these notes at this time to their office. Once the pt has been cleared we will be sure to fax over clearance notes and any recommendations.   Shanda Bumps thanked me for the help as I thanked her as well.

## 2022-02-11 ENCOUNTER — Encounter: Payer: Self-pay | Admitting: Physician Assistant

## 2022-02-11 ENCOUNTER — Ambulatory Visit (INDEPENDENT_AMBULATORY_CARE_PROVIDER_SITE_OTHER): Payer: Medicare Other | Admitting: Physician Assistant

## 2022-02-11 ENCOUNTER — Other Ambulatory Visit: Payer: Self-pay

## 2022-02-11 ENCOUNTER — Encounter
Admission: RE | Admit: 2022-02-11 | Discharge: 2022-02-11 | Disposition: A | Discharge status: Home / Self Care | Payer: Medicare Other | Source: Ambulatory Visit | Attending: Podiatry | Admitting: Podiatry

## 2022-02-11 VITALS — BP 174/68 | HR 66 | Ht 68.0 in | Wt 166.8 lb

## 2022-02-11 DIAGNOSIS — E785 Hyperlipidemia, unspecified: Secondary | ICD-10-CM

## 2022-02-11 DIAGNOSIS — I10 Essential (primary) hypertension: Secondary | ICD-10-CM

## 2022-02-11 DIAGNOSIS — I25708 Atherosclerosis of coronary artery bypass graft(s), unspecified, with other forms of angina pectoris: Secondary | ICD-10-CM

## 2022-02-11 DIAGNOSIS — Z01818 Encounter for other preprocedural examination: Secondary | ICD-10-CM | POA: Diagnosis not present

## 2022-02-11 DIAGNOSIS — I70299 Other atherosclerosis of native arteries of extremities, unspecified extremity: Secondary | ICD-10-CM

## 2022-02-11 DIAGNOSIS — E1165 Type 2 diabetes mellitus with hyperglycemia: Secondary | ICD-10-CM

## 2022-02-11 DIAGNOSIS — I480 Paroxysmal atrial fibrillation: Secondary | ICD-10-CM

## 2022-02-11 DIAGNOSIS — E1159 Type 2 diabetes mellitus with other circulatory complications: Secondary | ICD-10-CM

## 2022-02-11 DIAGNOSIS — I739 Peripheral vascular disease, unspecified: Secondary | ICD-10-CM

## 2022-02-11 DIAGNOSIS — L97909 Non-pressure chronic ulcer of unspecified part of unspecified lower leg with unspecified severity: Secondary | ICD-10-CM

## 2022-02-11 HISTORY — DX: Presence of aortocoronary bypass graft: Z95.1

## 2022-02-11 HISTORY — DX: Unspecified osteoarthritis, unspecified site: M19.90

## 2022-02-11 HISTORY — DX: Pneumonia, unspecified organism: J18.9

## 2022-02-11 HISTORY — DX: Unspecified injury of muscle(s) and tendon(s) of the rotator cuff of unspecified shoulder, initial encounter: S46.009A

## 2022-02-11 MED ORDER — CHLORHEXIDINE GLUCONATE 0.12 % MT SOLN: 15.0000 mL | Freq: Once | OROMUCOSAL | Status: AC

## 2022-02-11 MED ORDER — CEFAZOLIN SODIUM-DEXTROSE 2-4 GM/100ML-% IV SOLN
2.0000 g | INTRAVENOUS | Status: AC
  Administered 2022-02-12: 2 g via INTRAVENOUS

## 2022-02-11 MED ORDER — ORAL CARE MOUTH RINSE: 15.0000 mL | Freq: Once | OROMUCOSAL | Status: AC

## 2022-02-11 MED ORDER — FAMOTIDINE 20 MG PO TABS: 20.0000 mg | ORAL_TABLET | Freq: Once | ORAL | Status: AC

## 2022-02-11 MED ORDER — SODIUM CHLORIDE 0.9 % IV SOLN: INTRAVENOUS | Status: DC

## 2022-02-11 NOTE — Patient Instructions (Addendum)
Your procedure is scheduled on: 02/12/2022  Report to the Registration Desk on the 1st floor of the [facility]. To find out your arrival time, please call [phone] between 1PM - 3PM on: 02/11/2022  If your arrival time is 6:00 am, do not arrive prior to that time as the [facility] entrance doors do not open until 6:00 am.  REMEMBER: Instructions that are not followed completely may result in serious medical risk, up to and including death; or upon the discretion of your surgeon and anesthesiologist your surgery may need to be rescheduled.  Do not eat food after midnight the night before surgery. Finish prescribed drink ( G2 if patient is diabetic) up to two hours before you are scheduled to arrive- this is provided for you. No gum chewing, lozengers or hard candies.   TAKE THESE MEDICATIONS THE MORNING OF SURGERY WITH A SIP OF WATER: amLODipine atenolol (TENORMIN) rosuvastatin (CRESTOR) Lomotil mucinex   **Follow new guidelines for insulin and diabetes medications.** Do not take glipizide on day of surgery.   Follow recommendations from Cardiologist, surgeon ,Pulmonologist or PCP regarding stopping Aspirin and Plavix.   One week prior to surgery: Stop Anti-inflammatories (NSAIDS) such as Advil, Aleve, Ibuprofen, Motrin, Naproxen, Naprosyn and Aspirin based products such as Excedrin, Goodys Powder, BC Powder. Stop ANY OVER THE COUNTER supplements until after surgery like  Cinnamon You may however, continue to take Tylenol if needed for pain up until the day of surgery.  No Alcohol for 24 hours before or after surgery.  No Smoking including e-cigarettes for 24 hours prior to surgery.  No chewable tobacco products for at least 6 hours prior to surgery.  No nicotine patches on the day of surgery.  Do not use any "recreational" drugs for at least a week prior to your surgery.  Please be advised that the combination of cocaine and anesthesia may have negative outcomes, up to and  including death. If you test positive for cocaine, your surgery will be cancelled.  On the morning of surgery brush your teeth with toothpaste and water, you may rinse your mouth with mouthwash if you wish. Do not swallow any toothpaste or mouthwash.  Use CHG wipes as directed on instruction sheet-  Do not wear jewelry, make-up, hairpins, clips or nail polish.  Do not wear lotions, powders, or perfumes.   Do not shave body from the neck down 48 hours prior to surgery just in case you cut yourself which could leave a site for infection.  Also, freshly shaved skin may become irritated if using the CHG soap.  Contact lenses, hearing aids and dentures may not be worn into surgery.  Do not bring valuables to the hospital. [facility] is not responsible for any missing/lost belongings or valuables.    Notify your doctor if there is any change in your medical condition (cold, fever, infection).  Wear comfortable clothing (specific to your surgery type) to the hospital.  After surgery, you can help prevent lung complications by doing breathing exercises.  Take deep breaths and cough every 1-2 hours. Your doctor may order a device called an Incentive Spirometer to help you take deep breaths.  If you are being admitted to the hospital overnight, leave your suitcase in the car. After surgery it may be brought to your room.  If you are being discharged the day of surgery, you will not be allowed to drive home. You will need a responsible adult (18 years or older) to drive you home and  stay with you that night.   If you are taking public transportation, you will need to have a responsible adult (18 years or older) with you. Please confirm with your physician that it is acceptable to use public transportation.   Please call the Pre-admissions Testing Dept. at [phone] if you have any questions about these instructions.  Surgery Visitation Policy:  Patients undergoing a surgery or  procedure may have two family members or support persons with them as long as the person is not COVID-19 positive or experiencing its symptoms.

## 2022-02-11 NOTE — Patient Instructions (Signed)
Medication Instructions:  Your physician recommends that you continue on your current medications as directed. Please refer to the Current Medication list given to you today.  *If you need a refill on your cardiac medications before your next appointment, please call your pharmacy*   Lab Work: None If you have labs (blood work) drawn today and your tests are completely normal, you will receive your results only by: MyChart Message (if you have MyChart) OR A paper copy in the mail If you have any lab test that is abnormal or we need to change your treatment, we will call you to review the results.   Follow-Up: At [facility], you and your health needs are our priority.  As part of our continuing mission to provide you with exceptional heart care, we have created designated Provider Care Teams.  These Care Teams include your primary Cardiologist (physician) and Advanced Practice Providers (APPs -  Physician Assistants and Nurse Practitioners) who all work together to provide you with the care you need, when you need it.  Your next appointment:   Next available  The format for your next appointment:   In Person  Provider:   Julien Nordmann, MD{  Other Instructions Check your blood pressure daily for the next two weeks and call us with the readings or send them to [place] through your MyChart.  Important Information About Sugar

## 2022-02-12 ENCOUNTER — Encounter: Payer: Self-pay | Admitting: Podiatry

## 2022-02-12 ENCOUNTER — Ambulatory Visit: Payer: Medicare Other | Admitting: Urgent Care

## 2022-02-12 ENCOUNTER — Other Ambulatory Visit: Payer: Self-pay

## 2022-02-12 ENCOUNTER — Encounter
Admission: RE | Disposition: A | Discharge status: Home / Self Care | Payer: Self-pay | Source: Home / Self Care | Attending: Podiatry

## 2022-02-12 ENCOUNTER — Ambulatory Visit
Admission: RE | Admit: 2022-02-12 | Discharge: 2022-02-12 | Disposition: A | Discharge status: Home / Self Care | Payer: Medicare Other | Attending: Podiatry | Admitting: Podiatry

## 2022-02-12 DIAGNOSIS — E785 Hyperlipidemia, unspecified: Secondary | ICD-10-CM | POA: Insufficient documentation

## 2022-02-12 DIAGNOSIS — I4891 Unspecified atrial fibrillation: Secondary | ICD-10-CM | POA: Insufficient documentation

## 2022-02-12 DIAGNOSIS — L97524 Non-pressure chronic ulcer of other part of left foot with necrosis of bone: Secondary | ICD-10-CM | POA: Insufficient documentation

## 2022-02-12 DIAGNOSIS — E11621 Type 2 diabetes mellitus with foot ulcer: Secondary | ICD-10-CM | POA: Insufficient documentation

## 2022-02-12 DIAGNOSIS — I509 Heart failure, unspecified: Secondary | ICD-10-CM | POA: Diagnosis not present

## 2022-02-12 DIAGNOSIS — E1142 Type 2 diabetes mellitus with diabetic polyneuropathy: Secondary | ICD-10-CM | POA: Insufficient documentation

## 2022-02-12 DIAGNOSIS — I110 Hypertensive heart disease with heart failure: Secondary | ICD-10-CM | POA: Diagnosis not present

## 2022-02-12 DIAGNOSIS — I70298 Other atherosclerosis of native arteries of extremities, other extremity: Secondary | ICD-10-CM | POA: Diagnosis not present

## 2022-02-12 DIAGNOSIS — Z7902 Long term (current) use of antithrombotics/antiplatelets: Secondary | ICD-10-CM | POA: Diagnosis not present

## 2022-02-12 DIAGNOSIS — M86172 Other acute osteomyelitis, left ankle and foot: Secondary | ICD-10-CM | POA: Diagnosis present

## 2022-02-12 DIAGNOSIS — E1151 Type 2 diabetes mellitus with diabetic peripheral angiopathy without gangrene: Secondary | ICD-10-CM | POA: Insufficient documentation

## 2022-02-12 DIAGNOSIS — Z951 Presence of aortocoronary bypass graft: Secondary | ICD-10-CM | POA: Insufficient documentation

## 2022-02-12 DIAGNOSIS — I2510 Atherosclerotic heart disease of native coronary artery without angina pectoris: Secondary | ICD-10-CM | POA: Insufficient documentation

## 2022-02-12 DIAGNOSIS — E1165 Type 2 diabetes mellitus with hyperglycemia: Secondary | ICD-10-CM

## 2022-02-12 DIAGNOSIS — Z86718 Personal history of other venous thrombosis and embolism: Secondary | ICD-10-CM | POA: Diagnosis not present

## 2022-02-12 HISTORY — PX: AMPUTATION TOE: SHX6595

## 2022-02-12 LAB — GLUCOSE, CAPILLARY
Glucose-Capillary: 112 mg/dL — ABNORMAL HIGH (ref 70–99)
Glucose-Capillary: 120 mg/dL — ABNORMAL HIGH (ref 70–99)

## 2022-02-12 SURGERY — AMPUTATION, TOE
Anesthesia: General | Site: Toe | Laterality: Left

## 2022-02-12 MED ORDER — MIDAZOLAM HCL 2 MG/2ML IJ SOLN
INTRAMUSCULAR | Status: AC
  Filled 2022-02-12: qty 2

## 2022-02-12 MED ORDER — PROPOFOL 10 MG/ML IV BOLUS
INTRAVENOUS | Status: DC | PRN
  Administered 2022-02-12: 50 mg via INTRAVENOUS

## 2022-02-12 MED ORDER — SEVOFLURANE IN SOLN
RESPIRATORY_TRACT | Status: AC
  Filled 2022-02-12: qty 250

## 2022-02-12 MED ORDER — ONDANSETRON HCL 4 MG/2ML IJ SOLN: 4.0000 mg | Freq: Once | INTRAMUSCULAR | Status: DC | PRN

## 2022-02-12 MED ORDER — BUPIVACAINE HCL 0.5 % IJ SOLN
INTRAMUSCULAR | Status: DC | PRN
  Administered 2022-02-12: 5 mL

## 2022-02-12 MED ORDER — LACTATED RINGERS IV SOLN: INTRAVENOUS | Status: DC

## 2022-02-12 MED ORDER — ONDANSETRON HCL 4 MG/2ML IJ SOLN: 4.0000 mg | Freq: Four times a day (QID) | INTRAMUSCULAR | Status: DC | PRN

## 2022-02-12 MED ORDER — FENTANYL CITRATE (PF) 100 MCG/2ML IJ SOLN: 25.0000 ug | INTRAMUSCULAR | Status: DC | PRN

## 2022-02-12 MED ORDER — CEFAZOLIN SODIUM-DEXTROSE 2-4 GM/100ML-% IV SOLN
INTRAVENOUS | Status: AC
  Filled 2022-02-12: qty 100

## 2022-02-12 MED ORDER — DEXMEDETOMIDINE HCL IN NACL 80 MCG/20ML IV SOLN
INTRAVENOUS | Status: AC
  Filled 2022-02-12: qty 20

## 2022-02-12 MED ORDER — ONDANSETRON HCL 4 MG/2ML IJ SOLN
INTRAMUSCULAR | Status: DC | PRN
  Administered 2022-02-12: 4 mg via INTRAVENOUS

## 2022-02-12 MED ORDER — TRAMADOL HCL 50 MG PO TABS: 50.0000 mg | ORAL_TABLET | Freq: Four times a day (QID) | ORAL | 0 refills | Status: DC | PRN

## 2022-02-12 MED ORDER — CHLORHEXIDINE GLUCONATE 0.12 % MT SOLN
OROMUCOSAL | Status: AC
  Administered 2022-02-12: 15 mL via OROMUCOSAL
  Filled 2022-02-12: qty 15

## 2022-02-12 MED ORDER — METOCLOPRAMIDE HCL 10 MG PO TABS: 5.0000 mg | ORAL_TABLET | Freq: Three times a day (TID) | ORAL | Status: DC | PRN

## 2022-02-12 MED ORDER — LIDOCAINE HCL (PF) 1 % IJ SOLN
INTRAMUSCULAR | Status: AC
  Filled 2022-02-12: qty 30

## 2022-02-12 MED ORDER — FENTANYL CITRATE (PF) 100 MCG/2ML IJ SOLN
INTRAMUSCULAR | Status: AC
  Filled 2022-02-12: qty 2

## 2022-02-12 MED ORDER — METOCLOPRAMIDE HCL 5 MG/ML IJ SOLN: 5.0000 mg | Freq: Three times a day (TID) | INTRAMUSCULAR | Status: DC | PRN

## 2022-02-12 MED ORDER — PROPOFOL 10 MG/ML IV BOLUS
INTRAVENOUS | Status: AC
  Filled 2022-02-12: qty 20

## 2022-02-12 MED ORDER — BUPIVACAINE HCL (PF) 0.5 % IJ SOLN
INTRAMUSCULAR | Status: AC
  Filled 2022-02-12: qty 30

## 2022-02-12 MED ORDER — MIDAZOLAM HCL 2 MG/2ML IJ SOLN
INTRAMUSCULAR | Status: DC | PRN
  Administered 2022-02-12: 2 mg via INTRAVENOUS

## 2022-02-12 MED ORDER — LIDOCAINE HCL (CARDIAC) PF 100 MG/5ML IV SOSY
PREFILLED_SYRINGE | INTRAVENOUS | Status: DC | PRN
  Administered 2022-02-12: 100 mg via INTRAVENOUS

## 2022-02-12 MED ORDER — BUPIVACAINE LIPOSOME 1.3 % IJ SUSP
INTRAMUSCULAR | Status: AC
  Filled 2022-02-12: qty 20

## 2022-02-12 MED ORDER — PROPOFOL 500 MG/50ML IV EMUL
INTRAVENOUS | Status: DC | PRN
  Administered 2022-02-12: 150 ug/kg/min via INTRAVENOUS

## 2022-02-12 MED ORDER — DEXMEDETOMIDINE (PRECEDEX) IN NS 20 MCG/5ML (4 MCG/ML) IV SYRINGE
PREFILLED_SYRINGE | INTRAVENOUS | Status: DC | PRN
  Administered 2022-02-12: 8 ug via INTRAVENOUS

## 2022-02-12 MED ORDER — ONDANSETRON HCL 4 MG/2ML IJ SOLN
INTRAMUSCULAR | Status: AC
  Filled 2022-02-12: qty 2

## 2022-02-12 MED ORDER — FAMOTIDINE 20 MG PO TABS
ORAL_TABLET | ORAL | Status: AC
  Administered 2022-02-12: 20 mg via ORAL
  Filled 2022-02-12: qty 1

## 2022-02-12 MED ORDER — FENTANYL CITRATE (PF) 100 MCG/2ML IJ SOLN
INTRAMUSCULAR | Status: DC | PRN
  Administered 2022-02-12 (×2): 25 ug via INTRAVENOUS

## 2022-02-12 MED ORDER — ACETAMINOPHEN 10 MG/ML IV SOLN: 1000.0000 mg | Freq: Once | INTRAVENOUS | Status: DC | PRN

## 2022-02-12 MED ORDER — ONDANSETRON HCL 4 MG PO TABS: 4.0000 mg | ORAL_TABLET | Freq: Four times a day (QID) | ORAL | Status: DC | PRN

## 2022-02-12 MED ORDER — 0.9 % SODIUM CHLORIDE (POUR BTL) OPTIME
TOPICAL | Status: DC | PRN
  Administered 2022-02-12: 500 mL

## 2022-02-12 MED ORDER — LIDOCAINE HCL (PF) 2 % IJ SOLN
INTRAMUSCULAR | Status: AC
  Filled 2022-02-12: qty 5

## 2022-02-12 SURGICAL SUPPLY — 49 items
BLADE OSC/SAGITTAL MD 5.5X18 (BLADE) ×2 IMPLANT
BLADE SURG MINI STRL (BLADE) ×3 IMPLANT
BNDG CMPR 75X21 PLY HI ABS (MISCELLANEOUS) ×1
BNDG CMPR STD VLCR NS LF 5.8X4 (GAUZE/BANDAGES/DRESSINGS) ×1
BNDG ELASTIC 4X5.8 VLCR NS LF (GAUZE/BANDAGES/DRESSINGS) ×3 IMPLANT
BNDG ESMARK 4X12 TAN STRL LF (GAUZE/BANDAGES/DRESSINGS) ×3 IMPLANT
BNDG GAUZE DERMACEA FLUFF (GAUZE/BANDAGES/DRESSINGS) ×1
BNDG GAUZE DERMACEA FLUFF 4 (GAUZE/BANDAGES/DRESSINGS) ×2 IMPLANT
BNDG GZE 12X3 1 PLY HI ABS (GAUZE/BANDAGES/DRESSINGS) ×2
BNDG GZE DERMACEA 4 6PLY (GAUZE/BANDAGES/DRESSINGS) ×1
BNDG STRETCH GAUZE 3IN X12FT (GAUZE/BANDAGES/DRESSINGS) ×6 IMPLANT
CUFF TOURN SGL QUICK 12 (TOURNIQUET CUFF) IMPLANT
CUFF TOURN SGL QUICK 18X4 (TOURNIQUET CUFF) IMPLANT
DRAPE FLUOR MINI C-ARM 54X84 (DRAPES) ×3 IMPLANT
DRAPE XRAY CASSETTE 23X24 (DRAPES) ×3 IMPLANT
DURAPREP 26ML APPLICATOR (WOUND CARE) ×3 IMPLANT
ELECT REM PT RETURN 9FT ADLT (ELECTROSURGICAL) ×2
ELECTRODE REM PT RTRN 9FT ADLT (ELECTROSURGICAL) ×2 IMPLANT
GAUZE PACKING IODOFORM 1/2 (PACKING) ×3 IMPLANT
GAUZE SPONGE 4X4 12PLY STRL (GAUZE/BANDAGES/DRESSINGS) ×3 IMPLANT
GAUZE STRETCH 2X75IN STRL (MISCELLANEOUS) ×3 IMPLANT
GAUZE XEROFORM 1X8 LF (GAUZE/BANDAGES/DRESSINGS) ×3 IMPLANT
GLOVE BIO SURGEON STRL SZ7.5 (GLOVE) ×3 IMPLANT
GLOVE SURG UNDER LTX SZ8 (GLOVE) ×3 IMPLANT
GOWN STRL REUS W/ TWL XL LVL3 (GOWN DISPOSABLE) ×4 IMPLANT
GOWN STRL REUS W/TWL XL LVL3 (GOWN DISPOSABLE) ×4
IV NS IRRIG 3000ML ARTHROMATIC (IV SOLUTION) ×3 IMPLANT
KIT TURNOVER KIT A (KITS) ×3 IMPLANT
LABEL OR SOLS (LABEL) ×3 IMPLANT
MANIFOLD NEPTUNE II (INSTRUMENTS) ×3 IMPLANT
NDL FILTER BLUNT 18X1 1/2 (NEEDLE) ×2 IMPLANT
NDL HYPO 25X1 1.5 SAFETY (NEEDLE) ×2 IMPLANT
NEEDLE FILTER BLUNT 18X 1/2SAF (NEEDLE) ×1
NEEDLE FILTER BLUNT 18X1 1/2 (NEEDLE) ×1 IMPLANT
NEEDLE HYPO 25X1 1.5 SAFETY (NEEDLE) ×2 IMPLANT
NS IRRIG 500ML POUR BTL (IV SOLUTION) ×3 IMPLANT
PACK EXTREMITY ARMC (MISCELLANEOUS) ×3 IMPLANT
PAD ABD DERMACEA PRESS 5X9 (GAUZE/BANDAGES/DRESSINGS) ×6 IMPLANT
PULSAVAC PLUS IRRIG FAN TIP (DISPOSABLE)
SHIELD FULL FACE ANTIFOG 7M (MISCELLANEOUS) ×3 IMPLANT
STOCKINETTE M/LG 89821 (MISCELLANEOUS) ×3 IMPLANT
STRAP SAFETY 5IN WIDE (MISCELLANEOUS) ×3 IMPLANT
SUT ETHILON 3-0 FS-10 30 BLK (SUTURE) ×2
SUT ETHILON 5-0 FS-2 18 BLK (SUTURE) ×2 IMPLANT
SUT VIC AB 4-0 FS2 27 (SUTURE) ×2 IMPLANT
SUTURE EHLN 3-0 FS-10 30 BLK (SUTURE) ×2 IMPLANT
SYR 10ML LL (SYRINGE) ×9 IMPLANT
TIP FAN IRRIG PULSAVAC PLUS (DISPOSABLE) ×2 IMPLANT
WATER STERILE IRR 500ML POUR (IV SOLUTION) ×3 IMPLANT

## 2022-02-12 NOTE — H&P (Signed)
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  The various methods of treatment have been discussed with the patient.  No guarantees were given.  After consideration of risks, benefits and other options for treatment, the patient has consented to surgery.  I have reviewed the patients' chart and labs.     A history and physical examination was performed in my office.  The patient was reexamined.  There have been no changes to this history and physical examination.  Gwyneth Revels A

## 2022-02-12 NOTE — Anesthesia Postprocedure Evaluation (Signed)
Anesthesia Post Note  Patient: [name]  Procedure(s) Performed: AMPUTATION TOE (Left: Toe)  Patient location during evaluation: PACU Anesthesia Type: General Level of consciousness: awake and alert, oriented and patient cooperative Pain management: pain level controlled Vital Signs Assessment: post-procedure vital signs reviewed and stable Respiratory status: spontaneous breathing, nonlabored ventilation and respiratory function stable Cardiovascular status: blood pressure returned to baseline and stable Postop Assessment: adequate PO intake Anesthetic complications: no   No notable events documented.   Last Vitals:  Vitals:   02/12/22 1250 02/12/22 1300  BP: (!) 129/50 (!) 161/61  Pulse: (!) 59 (!) 59  Resp: 10 15  Temp:    SpO2: 100% 98%    Last Pain:  Vitals:   02/12/22 1300  TempSrc:   PainSc: 0-No pain                 Reed Breech

## 2022-02-12 NOTE — Anesthesia Procedure Notes (Signed)
Date/Time: 02/12/2022 12:08 PM  Performed by: Ginger Carne, CRNAPre-anesthesia Checklist: Patient identified, Emergency Drugs available, Suction available, Patient being monitored and Timeout performed Patient Re-evaluated:Patient Re-evaluated prior to induction Oxygen Delivery Method: Simple face mask Preoxygenation: Pre-oxygenation with 100% oxygen Induction Type: IV induction

## 2022-02-12 NOTE — Transfer of Care (Signed)
Immediate Anesthesia Transfer of Care Note  Patient: [name]  Procedure(s) Performed: AMPUTATION TOE (Left: Toe)  Patient Location: PACU  Anesthesia Type:General  Level of Consciousness: sedated  Airway & Oxygen Therapy: Patient Spontanous Breathing and Patient connected to face mask oxygen  Post-op Assessment: Report given to RN and Post -op Vital signs reviewed and stable  Post vital signs: Reviewed and stable  Last Vitals:  Vitals Value Taken Time  BP 129/50 02/12/22 1250  Temp    Pulse 59 02/12/22 1250  Resp 10 02/12/22 1250  SpO2 100 % 02/12/22 1250    Last Pain:  Vitals:   02/12/22 1048  TempSrc: Temporal  PainSc: 0-No pain         Complications: No notable events documented.

## 2022-02-12 NOTE — Discharge Instructions (Addendum)
[facility]  POST OPERATIVE INSTRUCTIONS FOR DR. FOWLER AND [facility]   Take your medication as prescribed.  Pain medication should be taken only as needed.  Keep the dressing clean, dry and intact.  Keep your foot elevated above the heart level for the first 48 hours.  Walking to the bathroom and brief periods of walking are acceptable, unless we have instructed you to be non-weight bearing.  Always wear your post-op shoe when walking.  Always use your crutches if you are to be non-weight bearing.  Do not take a shower. Baths are permissible as long as the foot is kept out of the water.   Every hour you are awake:  Bend your knee 15 times. Flex foot 15 times Massage calf 15 times  Call [facility] ([phone]) if any of the following problems occur: You develop a temperature or fever. The bandage becomes saturated with blood. Medication does not stop your pain. Injury of the foot occurs. Any symptoms of infection including redness, odor, or red streaks running from wound.    AMBULATORY SURGERY  DISCHARGE INSTRUCTIONS   The drugs that you were given will stay in your system until tomorrow so for the next 24 hours you should not:  Drive an automobile Make any legal decisions Drink any alcoholic beverage   You may resume regular meals tomorrow.  Today it is better to start with liquids and gradually work up to solid foods.  You may eat anything you prefer, but it is better to start with liquids, then soup and crackers, and gradually work up to solid foods.   Please notify your doctor immediately if you have any unusual bleeding, trouble breathing, redness and pain at the surgery site, drainage, fever, or pain not relieved by medication.    Additional Instructions:        Please contact your physician with any problems or Same Day Surgery at [phone], Monday through  Friday 6 am to 4 pm, or [place] at [place] [facility] number at [phone]. 

## 2022-02-12 NOTE — Anesthesia Preprocedure Evaluation (Addendum)
Anesthesia Evaluation  Patient identified by MRN, date of birth, ID band Patient awake    Reviewed: Allergy & Precautions, NPO status , Patient's Chart, lab work & pertinent test results  History of Anesthesia Complications Negative for: history of anesthetic complications  Airway Mallampati: III   Neck ROM: Full    Dental no notable dental hx.    Pulmonary neg pulmonary ROS,    Pulmonary exam normal breath sounds clear to auscultation       Cardiovascular hypertension, + CAD (s/p stents and CABG), + Peripheral Vascular Disease and +CHF  Normal cardiovascular exam+ dysrhythmias (a fib on Plavix)  Rhythm:Regular Rate:Normal  Hx PE  ECG 02/11/22: rate 66 with PVCs  Myocardial perfusion 05/30/17:  - Horizontal ST segment depression ST segment depression was noted during stress in the V5 and V6 leads. abnormal baseline ECG. - Defect 1: There is a small defect of mild severity present in the apex location. This is likely due to breast attenuation. - This is an intermediate risk study due to reduced EF - The left ventricular ejection fraction is mildly decreased (45-54%). Correlate with an echo. - The test was switched to Lexiscan due to inability to acheive 85% MPHR after 5 minutes of exercise.   Neuro/Psych PSYCHIATRIC DISORDERS Anxiety Depression negative neurological ROS     GI/Hepatic negative GI ROS,   Endo/Other  diabetes  Renal/GU negative Renal ROS     Musculoskeletal   Abdominal   Peds  Hematology negative hematology ROS (+)   Anesthesia Other Findings Cardiology note 02/11/22:  1.  Preop clearance  Ms. Mckenna's perioperative risk of a major cardiac event is 0.9% according to the Revised Cardiac Risk Index (RCRI).  Therefore, she is at low risk for perioperative complications.   Her functional capacity is excellent at 7.28 METs according to the Duke Activity Status Index (DASI). Recommendations: According to  ACC/AHA guidelines, no further cardiovascular testing needed.  The patient may proceed to surgery at acceptable risk.   Antiplatelet and/or Anticoagulation Recommendations: She does not need to hold Plavix per patient. She is already holding her Xarelto. Continue asa and Plavix, resume xarelto when deemed medically safe to do so.   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 Diabetes mellitus type 2 -per PCP  5. Hyperlipidemia -She will need updated Lipid panel when she comes into the office next  6. Coronary artery disease status post bypass grafting -No chest pain or SOB -Continue GDMT: ASA 81 mg, atenolol 50 mg daily, Plavix 75 mg daily, Crestor 20 mg daily, Xarelto 2.5 mg twice daily, as needed nitroglycerin, hydralazine 25 mg twice daily  7. 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  She is off this medication now due to surgery tomorrow.  8. Essential hypertension -Elevated in the office today 174/68.  Patient is very anxious about surgery. -I have asked her to take her blood pressure at home for 2 weeks and send me the values. -If her blood pressure remains elevated we will need to increase her hydralazine.  Disposition: Follow up  1-2 months  with Julien Nordmann, MD or APP.  Reproductive/Obstetrics                            Anesthesia Physical Anesthesia Plan  ASA: 3  Anesthesia Plan:  General   Post-op Pain Management:    Induction: Intravenous  PONV Risk Score and Plan: 3 and Treatment may vary due to age or medical condition and Ondansetron  Airway Management Planned: Natural Airway  Additional Equipment:   Intra-op Plan:   Post-operative Plan:   Informed Consent: I have reviewed the patients History and Physical, chart, labs  and discussed the procedure including the risks, benefits and alternatives for the proposed anesthesia with the patient or authorized representative who has indicated his/her understanding and acceptance.     Dental advisory given  Plan Discussed with: CRNA  Anesthesia Plan Comments: (LMA/GETA backup discussed.  Patient consented for risks of anesthesia including but not limited to:  - adverse reactions to medications - damage to eyes, teeth, lips or other oral mucosa - nerve damage due to positioning  - sore throat or hoarseness - damage to heart, brain, nerves, lungs, other parts of body or loss of life  Informed patient about role of CRNA in peri- and intra-operative care.  Patient voiced understanding.)      Anesthesia Quick Evaluation

## 2022-02-12 NOTE — Op Note (Signed)
Operative note   Surgeon:Jleigh Striplin Armed forces logistics/support/administrative officer: None    Preop diagnosis: Osteomyelitis left distal third toe    Postop diagnosis: Same    Procedure: Amputation metatarsophalangeal joint left third toe    EBL: Minimal    Anesthesia:local and IV sedation local consisted of a total of 5 cc of 0.5% bupivacaine    Hemostasis: None    Specimen: Distal phalanx for bone culture and left third toe for pathology    Complications: None    Operative indications:[name] is an 71 y.o. that presents today for surgical intervention.  The risks/benefits/alternatives/complications have been discussed and consent has been given.  Discussion regards to digital amputation versus transmetatarsal amputation was discussed prior to final decision on surgery.  She does have an ulcer to the plantar aspect of the left forefoot and she has undergone previous fourth and fifth ray amputations in the past.  She understands she is at risk of continued ulceration.  She has elected for amputation of just the left third toe at this time she has noted obvious infectious process with exposed bone to the left third toe.    Procedure:  Patient was brought into the OR and placed on the operating table in thesupine position. After anesthesia was obtained theleft lower extremity was prepped and draped in usual sterile fashion.  Attention was directed to the left third toe where 2 semielliptical incisions were made at the base of the toe.  Full-thickness flaps were created down to the metatarsophalangeal joint.  The toe was then disarticulated.  The wound was flushed with copious amounts of irrigation.  Bleeders were Bovie cauterized.  The wound was flushed with copious amounts of irrigation 1 final time and closure was performed with a 3-0 nylon.  The distal bone was sent for bone culture and the remaining toe was sent for pathology.  A bulky sterile dressing was applied.    Patient tolerated the procedure and  anesthesia well.  Was transported from the OR to the PACU with all vital signs stable and vascular status intact. To be discharged per routine protocol.  Will follow up in approximately 1 week in the outpatient clinic.

## 2022-02-12 NOTE — Progress Notes (Signed)
   02/12/22 1100  Clinical Encounter Type  Visited With Patient  Visit Type Initial;Pre-op  Referral From Nurse  Consult/Referral To Chaplain  Spiritual Encounters  Spiritual Needs Prayer   Chaplain paged to provide pre-op support and prayer.

## 2022-02-13 ENCOUNTER — Encounter: Payer: Self-pay | Admitting: Podiatry

## 2022-02-16 LAB — SURGICAL PATHOLOGY

## 2022-02-17 LAB — AEROBIC/ANAEROBIC CULTURE W GRAM STAIN (SURGICAL/DEEP WOUND)
Culture: NO GROWTH
Gram Stain: NONE SEEN

## 2022-03-04 ENCOUNTER — Encounter: Payer: Self-pay | Admitting: Podiatry

## 2022-03-26 ENCOUNTER — Other Ambulatory Visit (INDEPENDENT_AMBULATORY_CARE_PROVIDER_SITE_OTHER): Payer: Self-pay | Admitting: Vascular Surgery

## 2022-03-26 ENCOUNTER — Telehealth (INDEPENDENT_AMBULATORY_CARE_PROVIDER_SITE_OTHER): Payer: Self-pay | Admitting: Vascular Surgery

## 2022-03-26 DIAGNOSIS — Z9889 Other specified postprocedural states: Secondary | ICD-10-CM

## 2022-03-26 NOTE — Telephone Encounter (Signed)
ATC pt to R/S her appt on Monday 8.21.23 per VM left here yesterday. Pt's VM is full and unable to LM.
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
## 2022-03-29 ENCOUNTER — Encounter (INDEPENDENT_AMBULATORY_CARE_PROVIDER_SITE_OTHER): Payer: Medicare Other

## 2022-03-29 ENCOUNTER — Ambulatory Visit (INDEPENDENT_AMBULATORY_CARE_PROVIDER_SITE_OTHER): Payer: TRICARE For Life (TFL) | Admitting: Vascular Surgery

## 2022-03-29 DIAGNOSIS — E782 Mixed hyperlipidemia: Secondary | ICD-10-CM

## 2022-03-29 DIAGNOSIS — I70299 Other atherosclerosis of native arteries of extremities, unspecified extremity: Secondary | ICD-10-CM

## 2022-03-29 DIAGNOSIS — I10 Essential (primary) hypertension: Secondary | ICD-10-CM

## 2022-03-29 DIAGNOSIS — I25708 Atherosclerosis of coronary artery bypass graft(s), unspecified, with other forms of angina pectoris: Secondary | ICD-10-CM

## 2022-03-29 DIAGNOSIS — E1159 Type 2 diabetes mellitus with other circulatory complications: Secondary | ICD-10-CM

## 2022-04-05 ENCOUNTER — Telehealth: Payer: Self-pay | Admitting: Cardiovascular Disease

## 2022-04-05 NOTE — Telephone Encounter (Signed)
Pt c/o medication issue:  1. Name of Medication:   furosemide (LASIX) 20 MG tablet    2. How are you currently taking this medication (dosage and times per day)? Take 1 tablet (20 mg total) by mouth daily as needed for fluid or edema.Patient not taking: Reported on 02/12/2022  3. Are you having a reaction (difficulty breathing--STAT)? No  4. What is your medication issue? Pt states that PCP changed medication due to pt having fluid around her lungs. Pt would like a callback regarding this matter. Please advise

## 2022-04-05 NOTE — Telephone Encounter (Signed)
Spoke with pt. Pt states her PCP changed furosemide to 40 mg daily d/t shortness of breath and CXR that showed fluid around right lung per pt. Pt also due for 1-2 month follow up per last ov note with Jari Favre, PA-C.  Scheduled pt for follow up 04/07/22 at 3:35 with Tessa. Pt appreciative and has no further questions at this time.

## 2022-04-07 ENCOUNTER — Encounter: Payer: Self-pay | Admitting: Physician Assistant

## 2022-04-07 ENCOUNTER — Ambulatory Visit: Payer: Medicare Other | Attending: Physician Assistant | Admitting: Physician Assistant

## 2022-04-07 VITALS — BP 140/64 | HR 60 | Ht 68.0 in | Wt 172.0 lb

## 2022-04-07 DIAGNOSIS — I70299 Other atherosclerosis of native arteries of extremities, unspecified extremity: Secondary | ICD-10-CM | POA: Diagnosis present

## 2022-04-07 DIAGNOSIS — I10 Essential (primary) hypertension: Secondary | ICD-10-CM | POA: Insufficient documentation

## 2022-04-07 DIAGNOSIS — E1159 Type 2 diabetes mellitus with other circulatory complications: Secondary | ICD-10-CM | POA: Insufficient documentation

## 2022-04-07 DIAGNOSIS — I739 Peripheral vascular disease, unspecified: Secondary | ICD-10-CM | POA: Diagnosis present

## 2022-04-07 DIAGNOSIS — I480 Paroxysmal atrial fibrillation: Secondary | ICD-10-CM | POA: Diagnosis present

## 2022-04-07 DIAGNOSIS — E785 Hyperlipidemia, unspecified: Secondary | ICD-10-CM | POA: Diagnosis present

## 2022-04-07 DIAGNOSIS — I25708 Atherosclerosis of coronary artery bypass graft(s), unspecified, with other forms of angina pectoris: Secondary | ICD-10-CM | POA: Insufficient documentation

## 2022-04-07 DIAGNOSIS — L97909 Non-pressure chronic ulcer of unspecified part of unspecified lower leg with unspecified severity: Secondary | ICD-10-CM | POA: Insufficient documentation
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
## 2022-04-07 NOTE — Patient Instructions (Signed)
Medication Instructions:  Your physician recommends that you continue on your current medications as directed. Please refer to the Current Medication list given to you today.  *If you need a refill on your cardiac medications before your next appointment, please call your pharmacy*   Lab Work: None If you have labs (blood work) drawn today and your tests are completely normal, you will receive your results only by: MyChart Message (if you have MyChart) OR A paper copy in the mail If you have any lab test that is abnormal or we need to change your treatment, we will call you to review the results.   Follow-Up: At [facility], you and your health needs are our priority.  As part of our continuing mission to provide you with exceptional heart care, we have created designated Provider Care Teams.  These Care Teams include your primary Cardiologist (physician) and Advanced Practice Providers (APPs -  Physician Assistants and Nurse Practitioners) who all work together to provide you with the care you need, when you need it.  Your next appointment:   3-4 month(s)  The format for your next appointment:   In Person  Provider:   Julien Nordmann, MD    Important Information About Sugar

## 2022-05-11 ENCOUNTER — Ambulatory Visit: Payer: Medicare Other | Admitting: Cardiovascular Disease

## 2022-06-07 ENCOUNTER — Ambulatory Visit (INDEPENDENT_AMBULATORY_CARE_PROVIDER_SITE_OTHER): Payer: Medicare Other

## 2022-06-07 ENCOUNTER — Encounter (INDEPENDENT_AMBULATORY_CARE_PROVIDER_SITE_OTHER): Payer: Self-pay

## 2022-06-07 DIAGNOSIS — Z9889 Other specified postprocedural states: Secondary | ICD-10-CM

## 2022-06-07 DIAGNOSIS — I739 Peripheral vascular disease, unspecified: Secondary | ICD-10-CM

## 2022-06-19 ENCOUNTER — Other Ambulatory Visit (INDEPENDENT_AMBULATORY_CARE_PROVIDER_SITE_OTHER): Payer: Self-pay | Admitting: Vascular Surgery

## 2022-06-21 ENCOUNTER — Ambulatory Visit (INDEPENDENT_AMBULATORY_CARE_PROVIDER_SITE_OTHER): Payer: Medicare Other | Admitting: Vascular Surgery

## 2022-06-21 ENCOUNTER — Encounter (INDEPENDENT_AMBULATORY_CARE_PROVIDER_SITE_OTHER): Payer: Self-pay | Admitting: Vascular Surgery

## 2022-06-21 VITALS — BP 189/67 | HR 66 | Resp 16 | Wt 163.0 lb

## 2022-06-21 DIAGNOSIS — I10 Essential (primary) hypertension: Secondary | ICD-10-CM

## 2022-06-21 DIAGNOSIS — I70213 Atherosclerosis of native arteries of extremities with intermittent claudication, bilateral legs: Secondary | ICD-10-CM

## 2022-06-21 DIAGNOSIS — L97909 Non-pressure chronic ulcer of unspecified part of unspecified lower leg with unspecified severity: Secondary | ICD-10-CM | POA: Diagnosis not present

## 2022-06-21 DIAGNOSIS — E1159 Type 2 diabetes mellitus with other circulatory complications: Secondary | ICD-10-CM | POA: Diagnosis not present

## 2022-06-21 DIAGNOSIS — I70299 Other atherosclerosis of native arteries of extremities, unspecified extremity: Secondary | ICD-10-CM

## 2022-06-21 DIAGNOSIS — I25708 Atherosclerosis of coronary artery bypass graft(s), unspecified, with other forms of angina pectoris: Secondary | ICD-10-CM

## 2022-06-21 DIAGNOSIS — E782 Mixed hyperlipidemia: Secondary | ICD-10-CM
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
## 2022-06-27 ENCOUNTER — Encounter (INDEPENDENT_AMBULATORY_CARE_PROVIDER_SITE_OTHER): Payer: Self-pay | Admitting: Vascular Surgery

## 2022-07-03 NOTE — Progress Notes (Deleted)
NO SHOW

## 2022-07-05 ENCOUNTER — Telehealth (INDEPENDENT_AMBULATORY_CARE_PROVIDER_SITE_OTHER): Payer: Self-pay

## 2022-07-05 NOTE — Telephone Encounter (Signed)
Patient left a message stating that she was seen by Dr Alberteen Spindle and was recommended for partial left foot amputation and right toe amputation. Patient has infection to the bone with 4th right toe. Patient prefer iv antibiotics than surgery. Dr [name] will contact Dr Alberteen Spindle and discuss patient further evaluations.

## 2022-07-06 ENCOUNTER — Ambulatory Visit: Payer: Medicare Other | Attending: Cardiovascular Disease | Admitting: Cardiovascular Disease

## 2022-07-06 ENCOUNTER — Telehealth (INDEPENDENT_AMBULATORY_CARE_PROVIDER_SITE_OTHER): Payer: Self-pay

## 2022-07-06 DIAGNOSIS — E785 Hyperlipidemia, unspecified: Secondary | ICD-10-CM

## 2022-07-06 DIAGNOSIS — I480 Paroxysmal atrial fibrillation: Secondary | ICD-10-CM

## 2022-07-06 DIAGNOSIS — E1159 Type 2 diabetes mellitus with other circulatory complications: Secondary | ICD-10-CM

## 2022-07-06 DIAGNOSIS — I739 Peripheral vascular disease, unspecified: Secondary | ICD-10-CM

## 2022-07-06 DIAGNOSIS — I2699 Other pulmonary embolism without acute cor pulmonale: Secondary | ICD-10-CM

## 2022-07-06 DIAGNOSIS — I10 Essential (primary) hypertension: Secondary | ICD-10-CM

## 2022-07-06 DIAGNOSIS — L97909 Non-pressure chronic ulcer of unspecified part of unspecified lower leg with unspecified severity: Secondary | ICD-10-CM

## 2022-07-06 DIAGNOSIS — I25708 Atherosclerosis of coronary artery bypass graft(s), unspecified, with other forms of angina pectoris: Secondary | ICD-10-CM

## 2022-07-06 NOTE — Telephone Encounter (Signed)
Spoke with the patient and she is scheduled with Dr. [name] on 07/07/22 [facility] left leg  angio with a 12:30 pm arrival time. Pre-procedure instructions were discussed as well as not taking any medications and bringing them with her to the hospital as she will have general anesthesia onboard. Patient stated she understood.

## 2022-07-07 ENCOUNTER — Encounter: Payer: Self-pay | Admitting: Cardiovascular Disease

## 2022-07-07 ENCOUNTER — Telehealth (INDEPENDENT_AMBULATORY_CARE_PROVIDER_SITE_OTHER): Payer: Self-pay

## 2022-07-07 DIAGNOSIS — L97909 Non-pressure chronic ulcer of unspecified part of unspecified lower leg with unspecified severity: Secondary | ICD-10-CM

## 2022-07-07 MED ORDER — FAMOTIDINE 20 MG PO TABS: 40.0000 mg | ORAL_TABLET | Freq: Once | ORAL | Status: DC | PRN

## 2022-07-07 MED ORDER — METHYLPREDNISOLONE SODIUM SUCC 125 MG IJ SOLR: 125.0000 mg | Freq: Once | INTRAMUSCULAR | Status: DC | PRN

## 2022-07-07 MED ORDER — DIPHENHYDRAMINE HCL 50 MG/ML IJ SOLN: 50.0000 mg | Freq: Once | INTRAMUSCULAR | Status: DC | PRN

## 2022-07-07 MED ORDER — CEFAZOLIN SODIUM-DEXTROSE 2-4 GM/100ML-% IV SOLN
2.0000 g | INTRAVENOUS | Status: AC
  Administered 2022-07-13: 2 g via INTRAVENOUS

## 2022-07-07 MED ORDER — SODIUM CHLORIDE 0.9 % IV SOLN: INTRAVENOUS | Status: DC

## 2022-07-07 MED ORDER — MIDAZOLAM HCL 2 MG/ML PO SYRP: 8.0000 mg | ORAL_SOLUTION | Freq: Once | ORAL | Status: DC | PRN

## 2022-07-07 NOTE — Telephone Encounter (Signed)
Spoke with the patient and she has been rescheduled from today to 07/13/22 with a 1:00 pm arrival time [facility]. Pre-procedure instructions were discussed and patient stated she understood.
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
## 2022-07-13 ENCOUNTER — Encounter
Admission: RE | Disposition: A | Discharge status: Home / Self Care | Payer: Self-pay | Source: Home / Self Care | Attending: Vascular Surgery

## 2022-07-13 ENCOUNTER — Ambulatory Visit: Payer: Medicare Other | Admitting: Registered Nurse

## 2022-07-13 ENCOUNTER — Ambulatory Visit
Admission: RE | Admit: 2022-07-13 | Discharge: 2022-07-13 | Disposition: A | Discharge status: Home / Self Care | Payer: Medicare Other | Attending: Vascular Surgery | Admitting: Vascular Surgery

## 2022-07-13 ENCOUNTER — Encounter: Payer: Self-pay | Admitting: Vascular Surgery

## 2022-07-13 ENCOUNTER — Other Ambulatory Visit: Payer: Self-pay

## 2022-07-13 DIAGNOSIS — I70245 Atherosclerosis of native arteries of left leg with ulceration of other part of foot: Secondary | ICD-10-CM | POA: Diagnosis not present

## 2022-07-13 DIAGNOSIS — I10 Essential (primary) hypertension: Secondary | ICD-10-CM | POA: Diagnosis not present

## 2022-07-13 DIAGNOSIS — E782 Mixed hyperlipidemia: Secondary | ICD-10-CM | POA: Insufficient documentation

## 2022-07-13 DIAGNOSIS — E1151 Type 2 diabetes mellitus with diabetic peripheral angiopathy without gangrene: Secondary | ICD-10-CM | POA: Insufficient documentation

## 2022-07-13 DIAGNOSIS — L97909 Non-pressure chronic ulcer of unspecified part of unspecified lower leg with unspecified severity: Secondary | ICD-10-CM

## 2022-07-13 DIAGNOSIS — E11621 Type 2 diabetes mellitus with foot ulcer: Secondary | ICD-10-CM | POA: Diagnosis not present

## 2022-07-13 DIAGNOSIS — I70249 Atherosclerosis of native arteries of left leg with ulceration of unspecified site: Secondary | ICD-10-CM | POA: Diagnosis not present

## 2022-07-13 DIAGNOSIS — L97529 Non-pressure chronic ulcer of other part of left foot with unspecified severity: Secondary | ICD-10-CM | POA: Diagnosis not present

## 2022-07-13 DIAGNOSIS — L97929 Non-pressure chronic ulcer of unspecified part of left lower leg with unspecified severity: Secondary | ICD-10-CM | POA: Diagnosis not present

## 2022-07-13 DIAGNOSIS — I25708 Atherosclerosis of coronary artery bypass graft(s), unspecified, with other forms of angina pectoris: Secondary | ICD-10-CM | POA: Insufficient documentation

## 2022-07-13 HISTORY — PX: LOWER EXTREMITY ANGIOGRAPHY: CATH118251

## 2022-07-13 LAB — BUN: BUN: 26 mg/dL — ABNORMAL HIGH (ref 8–23)

## 2022-07-13 LAB — GLUCOSE, CAPILLARY: Glucose-Capillary: 81 mg/dL (ref 70–99)

## 2022-07-13 LAB — CREATININE, SERUM
Creatinine, Ser: 0.77 mg/dL (ref 0.44–1.00)
GFR, Estimated: >60 mL/min (ref >60)

## 2022-07-13 SURGERY — LOWER EXTREMITY ANGIOGRAPHY
Anesthesia: General | Laterality: Left

## 2022-07-13 MED ORDER — ONDANSETRON HCL 4 MG/2ML IJ SOLN: 4.0000 mg | Freq: Once | INTRAMUSCULAR | Status: DC | PRN

## 2022-07-13 MED ORDER — PHENYLEPHRINE HCL-NACL 20-0.9 MG/250ML-% IV SOLN
INTRAVENOUS | Status: DC | PRN
  Administered 2022-07-13: 25 ug/min via INTRAVENOUS

## 2022-07-13 MED ORDER — DOXYCYCLINE HYCLATE 100 MG PO TABS
200.0000 mg | ORAL_TABLET | Freq: Once | ORAL | Status: AC
  Administered 2022-07-13: 200 mg via ORAL
  Filled 2022-07-13: qty 2

## 2022-07-13 MED ORDER — PROPOFOL 10 MG/ML IV BOLUS
INTRAVENOUS | Status: DC | PRN
  Administered 2022-07-13: 200 mg via INTRAVENOUS

## 2022-07-13 MED ORDER — FENTANYL CITRATE (PF) 100 MCG/2ML IJ SOLN: 25.0000 ug | INTRAMUSCULAR | Status: DC | PRN

## 2022-07-13 MED ORDER — ONDANSETRON HCL 4 MG/2ML IJ SOLN
INTRAMUSCULAR | Status: DC | PRN
  Administered 2022-07-13 (×2): 4 mg via INTRAVENOUS

## 2022-07-13 MED ORDER — HYDROMORPHONE HCL 1 MG/ML IJ SOLN: 1.0000 mg | Freq: Once | INTRAMUSCULAR | Status: DC | PRN

## 2022-07-13 MED ORDER — SUCCINYLCHOLINE CHLORIDE 200 MG/10ML IV SOSY
PREFILLED_SYRINGE | INTRAVENOUS | Status: DC | PRN
  Administered 2022-07-13: 100 mg via INTRAVENOUS

## 2022-07-13 MED ORDER — MORPHINE SULFATE (PF) 4 MG/ML IV SOLN: 2.0000 mg | INTRAVENOUS | Status: DC | PRN

## 2022-07-13 MED ORDER — FENTANYL CITRATE (PF) 100 MCG/2ML IJ SOLN
INTRAMUSCULAR | Status: AC
  Filled 2022-07-13: qty 2

## 2022-07-13 MED ORDER — IODIXANOL 320 MG/ML IV SOLN
INTRAVENOUS | Status: DC | PRN
  Administered 2022-07-13: 85 mL via INTRA_ARTERIAL

## 2022-07-13 MED ORDER — SUGAMMADEX SODIUM 200 MG/2ML IV SOLN
INTRAVENOUS | Status: DC | PRN
  Administered 2022-07-13: 100 mg via INTRAVENOUS
  Administered 2022-07-13: 200 mg via INTRAVENOUS

## 2022-07-13 MED ORDER — GLYCOPYRROLATE 0.2 MG/ML IJ SOLN
INTRAMUSCULAR | Status: DC | PRN
  Administered 2022-07-13: .2 mg via INTRAVENOUS

## 2022-07-13 MED ORDER — ONDANSETRON HCL 4 MG/2ML IJ SOLN: 4.0000 mg | Freq: Four times a day (QID) | INTRAMUSCULAR | Status: DC | PRN

## 2022-07-13 MED ORDER — LIDOCAINE HCL (CARDIAC) PF 100 MG/5ML IV SOSY
PREFILLED_SYRINGE | INTRAVENOUS | Status: DC | PRN
  Administered 2022-07-13: 100 mg via INTRAVENOUS

## 2022-07-13 MED ORDER — HEPARIN SODIUM (PORCINE) 1000 UNIT/ML IJ SOLN
INTRAMUSCULAR | Status: DC | PRN
  Administered 2022-07-13: 6000 [IU] via INTRAVENOUS

## 2022-07-13 MED ORDER — PHENYLEPHRINE HCL (PRESSORS) 10 MG/ML IV SOLN
INTRAVENOUS | Status: AC
  Filled 2022-07-13: qty 1

## 2022-07-13 MED ORDER — DOXYCYCLINE HYCLATE 100 MG PO CAPS: 100.0000 mg | ORAL_CAPSULE | Freq: Two times a day (BID) | ORAL | 1 refills | Status: DC

## 2022-07-13 MED ORDER — FENTANYL CITRATE (PF) 100 MCG/2ML IJ SOLN
INTRAMUSCULAR | Status: DC | PRN
  Administered 2022-07-13: 100 ug via INTRAVENOUS

## 2022-07-13 MED ORDER — ROCURONIUM BROMIDE 100 MG/10ML IV SOLN
INTRAVENOUS | Status: DC | PRN
  Administered 2022-07-13: 40 mg via INTRAVENOUS
  Administered 2022-07-13: 10 mg via INTRAVENOUS

## 2022-07-13 SURGICAL SUPPLY — 25 items
BALLN LUTONIX DCB 5X60X130 (BALLOONS) ×1
BALLN LUTONIX DCB 6X40X130 (BALLOONS) ×1
BALLN ULTRASCOR 014 2.5X40X150 (BALLOONS) ×1
BALLN ULTRVRSE 2X40X150 (BALLOONS) ×1
BALLOON LUTONIX DCB 5X60X130 (BALLOONS) IMPLANT
BALLOON LUTONIX DCB 6X40X130 (BALLOONS) IMPLANT
BALLOON ULTRSCR 014 2.5X40X150 (BALLOONS) IMPLANT
BALLOON ULTRVRSE 2X40X150 (BALLOONS) IMPLANT
CATH ANGIO 5F PIGTAIL 100CM (CATHETERS) IMPLANT
CATH VERT 5FR 125CM (CATHETERS) IMPLANT
DEVICE STARCLOSE SE CLOSURE (Vascular Products) IMPLANT
GLIDEWIRE ADV .035X260CM (WIRE) IMPLANT
GOWN STRL REUS W/ TWL LRG LVL3 (GOWN DISPOSABLE) ×2 IMPLANT
GOWN STRL REUS W/TWL LRG LVL3 (GOWN DISPOSABLE) ×1
KIT ENCORE 26 ADVANTAGE (KITS) IMPLANT
NDL ENTRY 21GA 7CM ECHOTIP (NEEDLE) IMPLANT
NEEDLE ENTRY 21GA 7CM ECHOTIP (NEEDLE) ×1 IMPLANT
PACK ANGIOGRAPHY (CUSTOM PROCEDURE TRAY) ×2 IMPLANT
SET INTRO CAPELLA COAXIAL (SET/KITS/TRAYS/PACK) IMPLANT
SHEATH BRITE TIP 5FRX11 (SHEATH) IMPLANT
SHEATH RAABE 6FRX70 (SHEATH) IMPLANT
SYR MEDRAD MARK 7 150ML (SYRINGE) IMPLANT
TUBING CONTRAST HIGH PRESS 72 (TUBING) IMPLANT
WIRE GUIDERIGHT .035X150 (WIRE) IMPLANT
WIRE RUNTHROUGH .014X300CM (WIRE) IMPLANT

## 2022-07-13 NOTE — Op Note (Signed)
[facility]  Percutaneous Study/Intervention Procedural Note   Date of Surgery: 07/13/2022  Surgeon:  Hortencia Pilar  Pre-operative Diagnosis: Atherosclerotic occlusive disease bilateral lower extremities with left lower extremity with rest pain and ulceration.  Post-operative diagnosis:  Same  Procedure(s) Performed:             1.  Introduction catheter into left lower extremity 3rd order catheter placement               2.    Contrast injection left lower extremity for distal runoff             3.  Percutaneous transluminal angioplasty to 6 mm left popliteal artery             4.  Percutaneous transluminal angioplasty left peroneal to 2 mm and tibioperoneal trunk to 2.5 mm             5.  Star close closure right common femoral arteriotomy  Anesthesia: General anesthesia  Sheath: 70 cm 6 [place] Rabie sheath right common femoral retrograde  Contrast: 85 cc  Fluoroscopy Time: 10.6 minutes  Indications:  [name] presents with increasing pain of the left lower extremity.  This is associated with a ulceration that is not healing on the left.  This suggests the patient is having limb threatening ischemia. The risks and benefits are reviewed all questions answered patient agrees to proceed.  Procedure:   [name] is a 71 y.o. y.o. female who was identified and appropriate procedural time out was performed.  The patient was then placed supine on the table and prepped and draped in the usual sterile fashion.    Ultrasound was placed in the sterile sleeve and the right groin was evaluated the right common femoral artery was echolucent and pulsatile indicating patency.  Image was recorded for the permanent record and under real-time visualization a microneedle was inserted into the common femoral artery followed by the microwire and then the micro-sheath.  A J-wire was then advanced through the micro-sheath and a  5 [place] sheath was then inserted over a  J-wire. J-wire was then advanced and a 5 French pigtail catheter was positioned at the level of T12.  AP projection of the aorta was then obtained. Pigtail catheter was repositioned to above the bifurcation and a RAO view of the pelvis was obtained.  Subsequently a pigtail catheter with an Advantage wire was used to cross the aortic bifurcation.  The catheter and wire were advanced down into the left distal external iliac artery. Oblique view of the femoral bifurcation was then obtained and subsequently the wire was reintroduced and the pigtail catheter negotiated into the SFA representing third order catheter placement. Distal runoff was then performed.  5000 units of heparin was then given and allowed to circulate for several minutes.  A 6 French 70 cm Rabie sheath was advanced up and over the bifurcation and positioned in the femoral artery  KMP catheter and advantage Glidewire were then negotiated down into the distal popliteal. Catheter was then advanced. Hand injection contrast demonstrated the tibial anatomy in further detail.  The 0.035 advantage wire was then reintroduced.  A 4 mm x 40 mm Lutonix drug-eluting balloon was used to angioplasty the distal popliteal.  The inflation was for 1 minute at 12 atm. Follow-up imaging demonstrated patency with approximately 20% residual stenosis.  The detector was then positioned distally and a 0.014 wire advanced through the Lutonix balloon into the distal  peroneal under fluoroscopic guidance. A 2.5 mm x 40 mm ultra score balloon was advanced into the tibioperoneal trunk.  Inflation was to 12 atmospheres for 1 minute.  Next a 2 mm x 40 mm Ultraverse balloon was advanced into the peroneal.  Inflation was to 10 atm for 1 minute.  Follow-up imaging demonstrated patency of the peroneal with less than 10% residual stenosis throughout its entire course.   Imaging of the popliteal artery demonstrated a slight haziness at the intervention site and I elected to treat  this with a 6 mm x 40 mm Lutonix drug-eluting balloon inflated to 6 atm for 1 full minute.  Follow-up imaging demonstrated resolution of this haziness with wide patency and less than 10% residual stenosis throughout the entire popliteal.  After review of these images the sheath is pulled into the right external iliac oblique of the common femoral is obtained and a Star close device deployed. There no immediate Complications.  Findings:  The abdominal aorta is opacified with a bolus injection contrast. Renal arteries are single and widely patent without evidence of hemodynamically significant stenosis.  The aorta itself has diffuse calcific disease but no hemodynamically significant lesions. The common and external iliac arteries are widely patent bilaterally.  The left common femoral is widely patent as is the profunda femoris.  The SFA has diffuse atherosclerotic disease but there are no hemodynamically significant stenoses.  The popliteal and its distal one third does indeed have a significant stenosis greater than 80% over a distance of approximately 20 to 30 mm in length.  The trifurcation is heavily diseased with occlusion of the anterior tibial and the posterior tibial throughout their course.  The tibioperoneal trunk demonstrates a 60 to 70% stenosis in its midportion extending over 10 to 15 mm and then in its proximal one third there is a 60 to 70% stenosis extending over 20 mm.  Distal to this lesion the peroneal is widely patent with extensive collaterals filling the plantar arteries which fills the pedal arch.  Dorsalis pedis also fills but it is not a as richly collateralized as the plantar arteries.  Following angioplasty the peroneal now is in-line flow and looks quite nice with less than 10% residual stenosis. Angioplasty of the popliteal yields an excellent result with less than 10% residual stenosis.  Summary: Successful recanalization left lower extremity for limb salvage                            Disposition: Patient was taken to the recovery room in stable condition having tolerated the procedure well.  Belenda Cruise  07/13/2022,4:28 PM

## 2022-07-13 NOTE — Anesthesia Procedure Notes (Addendum)
Procedure Name: Intubation Date/Time: 07/13/2022 3:06 PM  Performed by: Mohammed Kindle, CRNAPre-anesthesia Checklist: Patient identified, Emergency Drugs available, Suction available and Patient being monitored Patient Re-evaluated:Patient Re-evaluated prior to induction Oxygen Delivery Method: Circle system utilized Preoxygenation: Pre-oxygenation with 100% oxygen Induction Type: IV induction Ventilation: Mask ventilation without difficulty Laryngoscope Size: McGraph and 3 Grade View: Grade I Tube type: Oral Tube size: 6.5 mm Number of attempts: 1 Airway Equipment and Method: Stylet and Oral airway Placement Confirmation: ETT inserted through vocal cords under direct vision, positive ETCO2, breath sounds checked- equal and bilateral and CO2 detector Secured at: 21 cm Tube secured with: Tape Dental Injury: Teeth and Oropharynx as per pre-operative assessment

## 2022-07-13 NOTE — Interval H&P Note (Signed)
History and Physical Interval Note:  07/13/2022 3:07 PM  [name]  has presented today for surgery, with the diagnosis of LLE Angio   BARD   ANESTHESIA    ASO w ulceration.  The various methods of treatment have been discussed with the patient and family. After consideration of risks, benefits and other options for treatment, the patient has consented to  Procedure(s): Lower Extremity Angiography (Left) as a surgical intervention.  The patient's history has been reviewed, patient examined, no change in status, stable for surgery.  I have reviewed the patient's chart and labs.  Questions were answered to the patient's satisfaction.     [name]

## 2022-07-14 ENCOUNTER — Encounter: Payer: Self-pay | Admitting: Vascular Surgery

## 2022-07-14 LAB — GLUCOSE, CAPILLARY: Glucose-Capillary: 83 mg/dL (ref 70–99)

## 2022-07-14 NOTE — Transfer of Care (Signed)
Immediate Anesthesia Transfer of Care Note  Patient: [name]  Procedure(s) Performed: Lower Extremity Angiography (Left)  Patient Location: PACU  Anesthesia Type:General  Level of Consciousness: awake, drowsy, and patient cooperative  Airway & Oxygen Therapy: Patient Spontanous Breathing and Patient connected to face mask oxygen  Post-op Assessment: Report given to RN and Post -op Vital signs reviewed and stable  Post vital signs: Reviewed and stable  Last Vitals:  Vitals Value Taken Time  BP 175/70 07/13/22 1830  Temp 36.8 C 07/13/22 1659  Pulse 75 07/13/22 1802  Resp 14 07/13/22 1844  SpO2 96 % 07/13/22 1830  Vitals shown include unvalidated device data.  Last Pain:  Vitals:   07/13/22 1830  TempSrc:   PainSc: 0-No pain         Complications: No notable events documented.

## 2022-07-16 NOTE — Anesthesia Postprocedure Evaluation (Signed)
Anesthesia Post Note  Patient: [name]  Procedure(s) Performed: Lower Extremity Angiography (Left)  Patient location during evaluation: PACU Anesthesia Type: General Level of consciousness: awake and alert Pain management: pain level controlled Vital Signs Assessment: post-procedure vital signs reviewed and stable Respiratory status: spontaneous breathing, nonlabored ventilation, respiratory function stable and patient connected to nasal cannula oxygen Cardiovascular status: blood pressure returned to baseline and stable Postop Assessment: no apparent nausea or vomiting Anesthetic complications: no   No notable events documented.   Last Vitals:  Vitals:   07/13/22 1800 07/13/22 1830  BP: (!) 175/72 (!) 175/70  Pulse: 77   Resp: 13 10  Temp:    SpO2: 95% 96%    Last Pain:  Vitals:   07/13/22 1830  TempSrc:   PainSc: 0-No pain                 Corinda Gubler

## 2022-07-19 ENCOUNTER — Encounter: Payer: Self-pay | Admitting: Vascular Surgery

## 2022-07-20 ENCOUNTER — Other Ambulatory Visit (INDEPENDENT_AMBULATORY_CARE_PROVIDER_SITE_OTHER): Payer: Self-pay | Admitting: Vascular Surgery

## 2022-07-20 DIAGNOSIS — I70222 Atherosclerosis of native arteries of extremities with rest pain, left leg: Secondary | ICD-10-CM

## 2022-07-20 DIAGNOSIS — Z9862 Peripheral vascular angioplasty status: Secondary | ICD-10-CM

## 2022-07-21 ENCOUNTER — Ambulatory Visit (INDEPENDENT_AMBULATORY_CARE_PROVIDER_SITE_OTHER): Payer: Medicare Other | Admitting: Podiatry

## 2022-07-21 ENCOUNTER — Telehealth: Payer: Self-pay | Admitting: *Deleted

## 2022-07-21 ENCOUNTER — Ambulatory Visit (INDEPENDENT_AMBULATORY_CARE_PROVIDER_SITE_OTHER): Payer: Medicare Other

## 2022-07-21 ENCOUNTER — Encounter: Payer: Self-pay | Admitting: Podiatry

## 2022-07-21 VITALS — BP 149/90 | HR 78

## 2022-07-21 DIAGNOSIS — M86172 Other acute osteomyelitis, left ankle and foot: Secondary | ICD-10-CM | POA: Diagnosis not present

## 2022-07-21 DIAGNOSIS — M86171 Other acute osteomyelitis, right ankle and foot: Secondary | ICD-10-CM

## 2022-07-21 DIAGNOSIS — I70213 Atherosclerosis of native arteries of extremities with intermittent claudication, bilateral legs: Secondary | ICD-10-CM

## 2022-07-21 MED ORDER — MUPIROCIN 2 % EX OINT: 1.0000 | TOPICAL_OINTMENT | Freq: Two times a day (BID) | CUTANEOUS | 2 refills | Status: DC

## 2022-07-21 NOTE — Patient Instructions (Signed)
Call [facility] to schedule your MRI at the below locations.  Please allow at least 1 business day after your visit to process the referral.  It may take longer depending on approval from insurance.  Please let me know if you have issues or problems scheduling the MRI   [facility] [phone] [address] [place], [place] [address]  [facility] [phone] [address] [place], [place] [address]  

## 2022-07-21 NOTE — Telephone Encounter (Signed)
Patient is wanting to know the name of the bandage that her foot was dressed with today ,said that she could get from [facility], she forgot, please advise.

## 2022-07-22 ENCOUNTER — Ambulatory Visit (INDEPENDENT_AMBULATORY_CARE_PROVIDER_SITE_OTHER): Payer: Medicare Other | Admitting: Nurse Practitioner

## 2022-07-22 ENCOUNTER — Encounter (INDEPENDENT_AMBULATORY_CARE_PROVIDER_SITE_OTHER): Payer: Self-pay | Admitting: Nurse Practitioner

## 2022-07-22 ENCOUNTER — Ambulatory Visit (INDEPENDENT_AMBULATORY_CARE_PROVIDER_SITE_OTHER): Payer: Medicare Other

## 2022-07-22 VITALS — BP 174/73 | HR 60 | Resp 16 | Ht 68.0 in | Wt 164.0 lb

## 2022-07-22 DIAGNOSIS — Z9862 Peripheral vascular angioplasty status: Secondary | ICD-10-CM

## 2022-07-22 DIAGNOSIS — E1159 Type 2 diabetes mellitus with other circulatory complications: Secondary | ICD-10-CM | POA: Diagnosis not present

## 2022-07-22 DIAGNOSIS — L97909 Non-pressure chronic ulcer of unspecified part of unspecified lower leg with unspecified severity: Secondary | ICD-10-CM

## 2022-07-22 DIAGNOSIS — I70299 Other atherosclerosis of native arteries of extremities, unspecified extremity: Secondary | ICD-10-CM | POA: Diagnosis not present

## 2022-07-22 DIAGNOSIS — I10 Essential (primary) hypertension: Secondary | ICD-10-CM

## 2022-07-22 DIAGNOSIS — I70222 Atherosclerosis of native arteries of extremities with rest pain, left leg: Secondary | ICD-10-CM

## 2022-07-22 NOTE — Telephone Encounter (Signed)
I used the Biatain dressing for the left foot. I told her she could buy those from [employer]. Tell her to search silicone wound dressing, needs at least a 3x3 size.

## 2022-07-22 NOTE — Telephone Encounter (Signed)
Patient has been updated with information

## 2022-07-23 ENCOUNTER — Telehealth: Payer: Self-pay | Admitting: Podiatry

## 2022-07-23 LAB — CBC WITH DIFFERENTIAL/PLATELET
Basophils Absolute: 0 10*3/uL (ref 0.0–0.2)
Basos: 0 %
EOS (ABSOLUTE): 0.2 10*3/uL (ref 0.0–0.4)
Eos: 3 %
Hematocrit: 35.6 % (ref 34.0–46.6)
Hemoglobin: 11.3 g/dL (ref 11.1–15.9)
Immature Grans (Abs): 0 10*3/uL (ref 0.0–0.1)
Immature Granulocytes: 0 %
Lymphocytes Absolute: 1.4 10*3/uL (ref 0.7–3.1)
Lymphs: 23 %
MCH: 27.2 pg (ref 26.6–33.0)
MCHC: 31.7 g/dL (ref 31.5–35.7)
MCV: 86 fL (ref 79–97)
Monocytes Absolute: 0.7 10*3/uL (ref 0.1–0.9)
Monocytes: 11 %
Neutrophils Absolute: 3.9 10*3/uL (ref 1.4–7.0)
Neutrophils: 63 %
Platelets: 253 10*3/uL (ref 150–450)
RBC: 4.15 x10E6/uL (ref 3.77–5.28)
RDW: 15.8 % — ABNORMAL HIGH (ref 11.7–15.4)
WBC: 6.2 10*3/uL (ref 3.4–10.8)

## 2022-07-23 LAB — SEDIMENTATION RATE: Sed Rate: 18 mm/hr (ref 0–40)

## 2022-07-23 LAB — C-REACTIVE PROTEIN: CRP: 5 mg/L (ref 0–10)
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
## 2022-07-26 ENCOUNTER — Ambulatory Visit: Payer: Medicare Other | Admitting: Podiatry

## 2022-07-26 NOTE — Progress Notes (Signed)
  Subjective:  Patient ID: [name], female    DOB: 07/11/1951,  MRN: [id number]  Chief Complaint  Patient presents with   Foot Pain    Left foot - hit toe x 1.5 year ago, went to [facility] she had osteomyelitis in the toe, ended up amputated, Dr. Alberteen Spindle has been treating, wound from surgery has healed, but now has a large wound plantar forefoot that she was told has osteomyelitis now and recommended transmet amputation, wanted a second opinion because the docs at [facility] were very rude to her about answering questions about alternative treatment, did have occluded vessels in left leg and had procedure last week to open it up   Foot Ulcer    4th toe right - wound from injury to the toe 2 months ago, also told she had osteo in that toe as well, on doxycycline   New Patient (Initial Visit)   Diabetes    Last A1c was 7.9    71 y.o. female presents with the above complaint. History confirmed with patient.   Objective:  Physical Exam: Both feet are fairly warm and well-perfused, there is a weakly palpable DP pulse on the left, nonpalpable pulses on the right, full-thickness ulceration submetatarsal 3 on the left foot with floppy toe of fourth toe previous fourth and fifth ray resections, lateral fourth toe ulcer full-thickness on the right    Radiographs: Multiple views x-ray of both feet show no definitive osteolysis or steomyelitis of the areas of concern Assessment:   1. Acute osteomyelitis of left ankle or foot (HCC)   2. Acute osteomyelitis of right ankle or foot (HCC)      Plan:  Patient was evaluated and treated and all questions answered.  We do long discussion regarding her prospects of limb salvage.  We discussed that even if the wounds were able to heal there is a functional limitation to the shape of the foot currently and risk of reulceration is incredibly high.  I do recommend we recheck her laboratory work to evaluate for the treatment of her osteomyelitis and  updated imaging as well.  MRIs have been ordered, the fourth toe on the right foot is concerning as well.  Recommend she change the dressings daily with mupirocin ointment, Rx sent to pharmacy.  No debridement performed today.  She will return to see me following the MRIs in 3 weeks for further wound care.  Return in about 3 weeks (around 08/11/2022) for wound care, after MRI to review, after lab work to review.

## 2022-08-04 ENCOUNTER — Encounter (INDEPENDENT_AMBULATORY_CARE_PROVIDER_SITE_OTHER): Payer: Self-pay | Admitting: Nurse Practitioner

## 2022-08-04 NOTE — Progress Notes (Signed)
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 when taking furosemide 60 tablet 4   QUEtiapine (SEROQUEL) 50 MG tablet Take 125 mg by mouth at bedtime. TAKE 1 AND 1/2 TABLETS BY MOUTH NIGHTLY AS DIRECTED     rosuvastatin (CRESTOR) 40 MG tablet Take 40 mg by mouth daily.     nitroGLYCERIN (NITROSTAT) 0.4 MG SL tablet Place 1 tablet (0.4 mg total) under the tongue every 5 (five) minutes as needed for chest pain. 25 tablet 3   No current facility-administered medications on file prior to visit.    There are no Patient Instructions on file for this  visit. No follow-ups on file.   Georgiana Spinner, NP

## 2022-08-06 ENCOUNTER — Ambulatory Visit
Admission: RE | Admit: 2022-08-06 | Discharge: 2022-08-06 | Disposition: A | Discharge status: Home / Self Care | Payer: Medicare Other | Source: Ambulatory Visit | Attending: Podiatry | Admitting: Podiatry

## 2022-08-06 DIAGNOSIS — M86171 Other acute osteomyelitis, right ankle and foot: Secondary | ICD-10-CM

## 2022-08-06 DIAGNOSIS — M86172 Other acute osteomyelitis, left ankle and foot: Secondary | ICD-10-CM

## 2022-08-13 ENCOUNTER — Telehealth: Payer: Self-pay | Admitting: *Deleted

## 2022-08-13 NOTE — Telephone Encounter (Signed)
Patient is calling for the results of her recent MRI a week ago Friday, is anxious and does not want to wait until upcoming appointment,please advise.

## 2022-08-18 ENCOUNTER — Telehealth: Payer: Self-pay | Admitting: *Deleted

## 2022-08-18 NOTE — Telephone Encounter (Signed)
Called pt to see if she can come in at 915 1/10. Unable to reach pt and her VM is full. I will call again shortly to r/s pt.

## 2022-08-18 NOTE — Telephone Encounter (Signed)
Pt called back and due to her schedule stipulations she is only able to come in the office in the afternoons. Therefore she is scheduled on the first available date 1/22. Pt states she is unable to access [place] records on her mychart and she has reached out to the help desk but has been unsuccessful in getting access. Pt wants to know is there any way she can get the results over the phone or some other way?   Please advise.

## 2022-08-18 NOTE — Telephone Encounter (Signed)
Patient is calling back , is unable to come in early, unless afternoon, she is also requesting the results of her MRI, please advise.

## 2022-08-18 NOTE — Telephone Encounter (Signed)
I called her to discuss and there was no answer, the mailbox for VM was full. [name] if she calls back it is OK to give her the results. I will discuss more with her at her visit if that is the soonest she can get in

## 2022-08-19 ENCOUNTER — Telehealth (INDEPENDENT_AMBULATORY_CARE_PROVIDER_SITE_OTHER): Payer: Self-pay

## 2022-08-19 NOTE — Telephone Encounter (Signed)
I attempted to contact the patient to schedule her for a RLE angio with Dr[facility]. A message was left for a return call.

## 2022-08-19 NOTE — Telephone Encounter (Signed)
Noted, thanks!

## 2022-08-25 ENCOUNTER — Ambulatory Visit: Payer: Medicare Other | Admitting: Podiatry

## 2022-08-27 ENCOUNTER — Telehealth (INDEPENDENT_AMBULATORY_CARE_PROVIDER_SITE_OTHER): Payer: Self-pay

## 2022-08-27 NOTE — Telephone Encounter (Signed)
Spoke with the patient and she will be scheduled on 09/21/22 with a 11:30 am arrival time to [facility] for a RLE angio with Dr.Schnier.  Anesthesia will be used and has been applied for as well. Pre-procedure instructions were discussed and will be mailed.

## 2022-08-30 ENCOUNTER — Ambulatory Visit (INDEPENDENT_AMBULATORY_CARE_PROVIDER_SITE_OTHER): Payer: Medicare Other | Admitting: Podiatry

## 2022-08-30 ENCOUNTER — Ambulatory Visit: Payer: Medicare Other | Admitting: Podiatry

## 2022-08-30 DIAGNOSIS — M86171 Other acute osteomyelitis, right ankle and foot: Secondary | ICD-10-CM | POA: Diagnosis not present

## 2022-08-30 NOTE — Progress Notes (Addendum)
Subjective:  Patient ID: [name], female    DOB: August 12, 1950,  MRN: [id number]  Chief Complaint  Patient presents with   Osteomyelitis     MRI review left    72 y.o. female presents with the above complaint. History confirmed with patient.   Objective:  Physical Exam: Both feet are fairly warm and well-perfused, there is a weakly palpable DP pulse on the left, nonpalpable pulses on the right, full-thickness ulceration submetatarsal 3 on the left foot measures 1.5 x 2.0 x 0.3 cm with floppy toe of fourth toe previous fourth and fifth ray resections, lateral fourth toe ulcer full-thickness on the right measures 2.0 x 0.5 cm       Radiographs: Multiple views x-ray of both feet show no definitive osteolysis or osteomyelitis of the areas of concern   Right foot MRI IMPRESSION: 1. Marrow edema in the fourth middle and distal phalanx concerning for osteomyelitis. 2. 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     Electronically Signed   By: Kathreen Devoid M.D.   On: 08/11/2022 08:45    Left left foot MRI IMPRESSION: 1. Bone destruction with bone marrow edema in the third metatarsal head and distal shaft consistent with osteomyelitis. 2. Mild marrow edema and erosive changes involving the second metatarsal head concerning for osteomyelitis. 3. Cellulitis of the forefoot. No drainable fluid collection to suggest an abscess.     Electronically Signed   By: Kathreen Devoid M.D.   On: 08/11/2022 08:41   Assessment:   1. Acute osteomyelitis of right ankle or foot ([place])       Plan:  Patient was evaluated and treated and all questions answered.  We reviewed the results of the MRI today.  I debrided her ulcerations of overlying hyperkeratosis and nonviable tissue in an excisional manner with a sharp scalpel.  Postdebridement measurements are noted above.  It was dressed with Iodosorb and dry sterile dressings.  We discussed treatment of her osteomyelitis.  Again I  discussed with her that from a structural and curative standpoint transmetatarsal amputation is likely to give her a longer term success with a durable amputation stump.  She is adamant about avoiding further amputation and would like to have a PICC line placed and treat the osteomyelitis with antibiotic therapy.  I discussed the limitations of this.  I discussed with her the necessity of obtaining a biopsy and culture specimen for this.  We discussed the risk and benefits of the procedure.  We discussed this could be done as an outpatient.  Informed consent was signed and reviewed..  She has previously been seen by Adrian Prows, MD at [facility] clinic for infectious disease.  She will follow-up with him for management of the PICC line, and I discussed her case with him, his office will reach out to her to set up PICC line administration.  She also has a planned upcoming angiography with Dr. Delana Meyer for the right side in February.  Will discuss with him possible coordination if he is able to do that sooner.  We discussed that no guarantees could be made and ultimately the success chance of this may be low.  She long-term is still at high risk of limb loss and further ulceration.    Surgical plan:  Procedure: -Left foot open bone biopsy, will select third metatarsal head as primary sample  Location: -[facility]  Anesthesia plan: -IV sedation and/or general  Postoperative pain plan: -OTC Tylenol  DVT prophylaxis: -None required  WB Restrictions /  DME needs: -WBAT in postop surgical shoe   No follow-ups on file.

## 2022-08-31 ENCOUNTER — Telehealth: Payer: Self-pay | Admitting: Podiatry

## 2022-08-31 HISTORY — PX: BONE BIOPSY: SHX375

## 2022-08-31 NOTE — Telephone Encounter (Signed)
DOS: 09/03/2022  Medicare Part A & B Tricare for Life  Bone Biopsy Foot Lt (14431)  Prior authorization is not required for either of patients insurances.

## 2022-09-02 MED ORDER — DOXYCYCLINE HYCLATE 100 MG PO TABS
200.0000 mg | ORAL_TABLET | Freq: Once | ORAL | Status: DC
  Filled 2022-09-02: qty 2

## 2022-09-03 ENCOUNTER — Ambulatory Visit: Payer: Self-pay

## 2022-09-03 ENCOUNTER — Ambulatory Visit
Admission: RE | Admit: 2022-09-03 | Discharge: 2022-09-03 | Disposition: A | Discharge status: Home / Self Care | Payer: Medicare Other | Attending: Podiatry | Admitting: Podiatry

## 2022-09-03 ENCOUNTER — Encounter
Admission: RE | Disposition: A | Discharge status: Home / Self Care | Payer: Self-pay | Source: Home / Self Care | Attending: Podiatry

## 2022-09-03 ENCOUNTER — Ambulatory Visit: Payer: Medicare Other | Admitting: Anesthesiology

## 2022-09-03 ENCOUNTER — Other Ambulatory Visit: Payer: Self-pay

## 2022-09-03 ENCOUNTER — Ambulatory Visit: Payer: Medicare Other

## 2022-09-03 ENCOUNTER — Encounter: Payer: Self-pay | Admitting: Podiatry

## 2022-09-03 DIAGNOSIS — I10 Essential (primary) hypertension: Secondary | ICD-10-CM | POA: Diagnosis not present

## 2022-09-03 DIAGNOSIS — Z86711 Personal history of pulmonary embolism: Secondary | ICD-10-CM | POA: Insufficient documentation

## 2022-09-03 DIAGNOSIS — I4891 Unspecified atrial fibrillation: Secondary | ICD-10-CM | POA: Diagnosis not present

## 2022-09-03 DIAGNOSIS — Z955 Presence of coronary angioplasty implant and graft: Secondary | ICD-10-CM | POA: Insufficient documentation

## 2022-09-03 DIAGNOSIS — M86172 Other acute osteomyelitis, left ankle and foot: Secondary | ICD-10-CM | POA: Insufficient documentation

## 2022-09-03 DIAGNOSIS — I739 Peripheral vascular disease, unspecified: Secondary | ICD-10-CM | POA: Diagnosis not present

## 2022-09-03 DIAGNOSIS — E1169 Type 2 diabetes mellitus with other specified complication: Secondary | ICD-10-CM | POA: Insufficient documentation

## 2022-09-03 DIAGNOSIS — Z89422 Acquired absence of other left toe(s): Secondary | ICD-10-CM | POA: Insufficient documentation

## 2022-09-03 DIAGNOSIS — Z951 Presence of aortocoronary bypass graft: Secondary | ICD-10-CM | POA: Diagnosis not present

## 2022-09-03 DIAGNOSIS — M86472 Chronic osteomyelitis with draining sinus, left ankle and foot: Secondary | ICD-10-CM

## 2022-09-03 DIAGNOSIS — E785 Hyperlipidemia, unspecified: Secondary | ICD-10-CM | POA: Insufficient documentation

## 2022-09-03 HISTORY — PX: BONE BIOPSY: SHX375

## 2022-09-03 LAB — GLUCOSE, CAPILLARY
Glucose-Capillary: 123 mg/dL — ABNORMAL HIGH (ref 70–99)
Glucose-Capillary: 105 mg/dL — ABNORMAL HIGH (ref 70–99)

## 2022-09-03 SURGERY — BIOPSY, BONE
Anesthesia: General | Site: Foot | Laterality: Left

## 2022-09-03 MED ORDER — FENTANYL CITRATE (PF) 100 MCG/2ML IJ SOLN
INTRAMUSCULAR | Status: DC | PRN
  Administered 2022-09-03 (×2): 25 ug via INTRAVENOUS
  Administered 2022-09-03: 50 ug via INTRAVENOUS

## 2022-09-03 MED ORDER — BUPIVACAINE HCL (PF) 0.5 % IJ SOLN
INTRAMUSCULAR | Status: AC
  Filled 2022-09-03: qty 30

## 2022-09-03 MED ORDER — CHLORHEXIDINE GLUCONATE 0.12 % MT SOLN
OROMUCOSAL | Status: AC
  Filled 2022-09-03: qty 15

## 2022-09-03 MED ORDER — SODIUM CHLORIDE 0.9 % IV SOLN
2.0000 g | Freq: Once | INTRAVENOUS | Status: AC
  Administered 2022-09-03: 2 g via INTRAVENOUS
  Filled 2022-09-03: qty 20

## 2022-09-03 MED ORDER — BUPIVACAINE HCL (PF) 0.5 % IJ SOLN
INTRAMUSCULAR | Status: DC | PRN
  Administered 2022-09-03: 6 mL

## 2022-09-03 MED ORDER — OXYCODONE HCL 5 MG PO TABS
5.0000 mg | ORAL_TABLET | Freq: Once | ORAL | Status: AC
  Administered 2022-09-03: 5 mg via ORAL

## 2022-09-03 MED ORDER — DEXMEDETOMIDINE HCL IN NACL 200 MCG/50ML IV SOLN
INTRAVENOUS | Status: DC | PRN
  Administered 2022-09-03: 12 ug via INTRAVENOUS

## 2022-09-03 MED ORDER — CHLORHEXIDINE GLUCONATE 0.12 % MT SOLN
15.0000 mL | Freq: Once | OROMUCOSAL | Status: AC
  Administered 2022-09-03: 15 mL via OROMUCOSAL

## 2022-09-03 MED ORDER — ACETAMINOPHEN 500 MG PO TABS
1000.0000 mg | ORAL_TABLET | Freq: Once | ORAL | Status: AC
  Administered 2022-09-03: 1000 mg via ORAL

## 2022-09-03 MED ORDER — OXYCODONE HCL 5 MG PO TABS: 5.0000 mg | ORAL_TABLET | Freq: Four times a day (QID) | ORAL | 0 refills | Status: AC | PRN

## 2022-09-03 MED ORDER — MIDAZOLAM HCL 2 MG/2ML IJ SOLN
INTRAMUSCULAR | Status: DC | PRN
  Administered 2022-09-03: 2 mg via INTRAVENOUS

## 2022-09-03 MED ORDER — DROPERIDOL 2.5 MG/ML IJ SOLN: 0.6250 mg | Freq: Once | INTRAMUSCULAR | Status: DC | PRN

## 2022-09-03 MED ORDER — VANCOMYCIN HCL IN DEXTROSE 1-5 GM/200ML-% IV SOLN: 1000.0000 mg | Freq: Once | INTRAVENOUS | Status: AC

## 2022-09-03 MED ORDER — PROMETHAZINE HCL 25 MG/ML IJ SOLN: 6.2500 mg | INTRAMUSCULAR | Status: DC | PRN

## 2022-09-03 MED ORDER — VANCOMYCIN HCL IN DEXTROSE 1-5 GM/200ML-% IV SOLN
INTRAVENOUS | Status: AC
  Administered 2022-09-03: 1000 mg via INTRAVENOUS
  Filled 2022-09-03: qty 200

## 2022-09-03 MED ORDER — KETOROLAC TROMETHAMINE 15 MG/ML IJ SOLN
INTRAMUSCULAR | Status: AC
  Filled 2022-09-03: qty 1

## 2022-09-03 MED ORDER — FENTANYL CITRATE (PF) 100 MCG/2ML IJ SOLN
INTRAMUSCULAR | Status: AC
  Filled 2022-09-03: qty 2

## 2022-09-03 MED ORDER — ORAL CARE MOUTH RINSE: 15.0000 mL | Freq: Once | OROMUCOSAL | Status: AC

## 2022-09-03 MED ORDER — OXYCODONE HCL 5 MG PO TABS
ORAL_TABLET | ORAL | Status: AC
  Filled 2022-09-03: qty 1

## 2022-09-03 MED ORDER — SODIUM CHLORIDE 0.9 % IV SOLN: INTRAVENOUS | Status: DC

## 2022-09-03 MED ORDER — ACETAMINOPHEN 500 MG PO TABS
ORAL_TABLET | ORAL | Status: AC
  Filled 2022-09-03: qty 2

## 2022-09-03 MED ORDER — MIDAZOLAM HCL 2 MG/2ML IJ SOLN
INTRAMUSCULAR | Status: AC
  Filled 2022-09-03: qty 2

## 2022-09-03 MED ORDER — ONDANSETRON HCL 4 MG/2ML IJ SOLN
INTRAMUSCULAR | Status: DC | PRN
  Administered 2022-09-03: 4 mg via INTRAVENOUS

## 2022-09-03 MED ORDER — KETOROLAC TROMETHAMINE 15 MG/ML IJ SOLN
15.0000 mg | Freq: Once | INTRAMUSCULAR | Status: AC | PRN
  Administered 2022-09-03: 15 mg via INTRAVENOUS

## 2022-09-03 SURGICAL SUPPLY — 35 items
BLADE SURG 15 STRL LF DISP TIS (BLADE) ×2 IMPLANT
BLADE SURG 15 STRL SS (BLADE)
BNDG CMPR 75X21 PLY HI ABS (MISCELLANEOUS) ×1
BNDG CMPR THK2 5X4 CHSV NS (GAUZE/BANDAGES/DRESSINGS)
BNDG COHESIVE 4X5 TAN NS LF (GAUZE/BANDAGES/DRESSINGS) ×2 IMPLANT
BNDG ELASTIC 4X5.8 VLCR STR LF (GAUZE/BANDAGES/DRESSINGS) IMPLANT
CNTNR URN SCR LID CUP LEK RST (MISCELLANEOUS) IMPLANT
CONT SPEC 4OZ STRL OR WHT (MISCELLANEOUS) ×1
COVER MAYO STAND STRL (DRAPES) ×2 IMPLANT
DURAPREP 26ML APPLICATOR (WOUND CARE) ×2 IMPLANT
ELECT REM PT RETURN 9FT ADLT (ELECTROSURGICAL)
ELECTRODE REM PT RTRN 9FT ADLT (ELECTROSURGICAL) IMPLANT
GAUZE 4X4 16PLY ~~LOC~~+RFID DBL (SPONGE) ×2 IMPLANT
GAUZE PAD ABD 8X10 STRL (GAUZE/BANDAGES/DRESSINGS) IMPLANT
GAUZE SPONGE 4X4 12PLY STRL (GAUZE/BANDAGES/DRESSINGS) ×2 IMPLANT
GAUZE STRETCH 2X75IN STRL (MISCELLANEOUS) ×2 IMPLANT
GAUZE XEROFORM 1X8 LF (GAUZE/BANDAGES/DRESSINGS) ×2 IMPLANT
GLOVE BIO SURGEON STRL SZ7.5 (GLOVE) ×2 IMPLANT
GLOVE SURG ORTHO LTX SZ7.5 (GLOVE) ×2 IMPLANT
GOWN STRL REUS W/TWL LRG LVL3 (GOWN DISPOSABLE) ×2 IMPLANT
KIT BASIN OR (CUSTOM PROCEDURE TRAY) ×2 IMPLANT
KIT TURNOVER CYSTO (KITS) ×2 IMPLANT
MANIFOLD NEPTUNE II (INSTRUMENTS) IMPLANT
NDL BIOPSY JAMSHIDI 11X6 (NEEDLE) ×2 IMPLANT
NEEDLE BIOPSY JAMSHIDI 11X6 (NEEDLE) IMPLANT
NEEDLE HYPO 22GX1.5 SAFETY (NEEDLE) IMPLANT
PACK EXTREMITY (MISCELLANEOUS) ×2 IMPLANT
PENCIL SMOKE EVACUATOR (MISCELLANEOUS) IMPLANT
STOCKINETTE 4X48 STRL (DRAPES) IMPLANT
SUT ETHILON 4 0 CL P 3 (SUTURE) IMPLANT
SUT ETHILON 4 0 PS 2 18 (SUTURE) IMPLANT
SWAB CULTURE ESWAB REG 1ML (MISCELLANEOUS) IMPLANT
SYR CONTROL 10ML LL (SYRINGE) ×4 IMPLANT
TUBE CONNECTING 12X1/4 (SUCTIONS) IMPLANT
UNDERPAD 30X36 HEAVY ABSORB (UNDERPADS AND DIAPERS) ×2 IMPLANT

## 2022-09-03 NOTE — Anesthesia Postprocedure Evaluation (Signed)
Anesthesia Post Note  Patient: [name]  Procedure(s) Performed: BONE BIOPSY (Left: Foot)  Patient location during evaluation: PACU Anesthesia Type: General Level of consciousness: awake and alert Pain management: pain level controlled Vital Signs Assessment: post-procedure vital signs reviewed and stable Respiratory status: spontaneous breathing, nonlabored ventilation, respiratory function stable and patient connected to nasal cannula oxygen Cardiovascular status: blood pressure returned to baseline and stable Postop Assessment: no apparent nausea or vomiting Anesthetic complications: no   There were no known notable events for this encounter.   Last Vitals:  Vitals:   09/03/22 1645 09/03/22 1700  BP:  (!) 154/58  Pulse:  (!) 50  Resp:  11  Temp: (!) 36.4 C   SpO2:  98%    Last Pain:  Vitals:   09/03/22 1700  TempSrc:   PainSc: 0-No pain                 Arita Miss

## 2022-09-03 NOTE — Brief Op Note (Signed)
09/03/2022  4:09 PM  PATIENT:  [name]  72 y.o. female  PRE-OPERATIVE DIAGNOSIS:  Acute osteomyelitis of right ankle  POST-OPERATIVE DIAGNOSIS:  Acute osteomyelitis of right ankle  PROCEDURE:  Procedure(s): BONE BIOPSY (Left)  SURGEON:  Surgeon(s) and Role:    * Daxter Paule, Stephan Minister, DPM - Primary  PHYSICIAN ASSISTANT:   ASSISTANTS: none   ANESTHESIA:   local and IV sedation  EBL:  minimal   BLOOD ADMINISTERED:none  DRAINS: none   LOCAL MEDICATIONS USED:  MARCAINE    and Amount: 10 ml  SPECIMEN:  third metatarsal head  DISPOSITION OF SPECIMEN:  pathology, microbiology  COUNTS:  YES  TOURNIQUET:  none used  DICTATION: .Note written in EPIC  PLAN OF CARE: Discharge to home after PACU  PATIENT DISPOSITION:  PACU - hemodynamically stable.   Delay start of Pharmacological VTE agent (>24hrs) due to surgical blood loss or risk of bleeding: no

## 2022-09-03 NOTE — H&P (Signed)
History and Physical Interval Note:  09/03/2022 1:49 PM  [name]  has presented today for surgery, with the diagnosis of left foot osteomyelitis.  The various methods of treatment have been discussed with the patient and family. After consideration of risks, benefits and other options for treatment, the patient has consented to   Procedure(s): BONE BIOPSY (Left) as a surgical intervention.  The patient's history has been reviewed, patient examined, no change in status, stable for surgery.  I have reviewed the patient's chart and labs.  Questions were answered to the patient's satisfaction.     [name]
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
## 2022-09-03 NOTE — Discharge Instructions (Addendum)
Post-Surgery Instructions  1. If you are recuperating from surgery anywhere other than home, please be sure to leave [place] a number where you can be reached. 2. Go directly home and rest. 3. The keep operated foot (or feet) elevated six inches above the hip when sitting or lying down. 4. Support the elevated foot and leg with pillows under the calf. DO NOT PLACE PILLOWS UNDER THE KNEE. 5. DO NOT get your bandages wet. This will increase your chances of getting an infection. 6. Wear your surgical shoe at all times when you are up. 7. A limited amount of pain and swelling may occur. The skin may take on a bruised appearance. This is no cause for alarm. 8. For slight pain and swelling, apply an ice pack directly over the bandage for 15 minutes every hour. Continue icing until seen in the office. DO NOT apply any form of heat to the area. 9. Have prescription(s) filled immediately and take as directed. 10. Drink lots of liquids, water, and juice. 11. CALL THE OFFICE IMMEDIATELY IF: a. Bleeding continues b. Pain increases and/or does not respond to medication c. Bandage or cast appears too tight d. Any liquids (water, coffee, etc.) have spilled on your bandages. e. Tripping, falling, or stubbing the surgical foot f. If your temperature rises above 101 g. If you have ANY questions at all 12. Please use the crutches, knee scooter, or walker you have prescribed, rented, or purchased. If you are non-weight bearing DO NOT put weight on the operated foot for _________ days. If you are weight-bearing, follow your physician's instructions. You are expected to be: ? weight-bearing   13. Special Instructions: Change dressing in 2 days (Sunday 1/28) and continue current wound care for the ulceration on the bottom of the foot. Neosporin or any antibiotic ointment can be applied to the ulcer and to the incision / stitches. Dress with Gauze and tape. 14. Your next appointment is:  _________________________________________  If you need to reach the nurse for any reason, please call: [place]: [phone] [place]: [phone] [place]: [phone] AMBULATORY SURGERY  DISCHARGE INSTRUCTIONS   The drugs that you were given will stay in your system until tomorrow so for the next 24 hours you should not:  Drive an automobile Make any legal decisions Drink any alcoholic beverage   You may resume regular meals tomorrow.  Today it is better to start with liquids and gradually work up to solid foods.  You may eat anything you prefer, but it is better to start with liquids, then soup and crackers, and gradually work up to solid foods.   Please notify your doctor immediately if you have any unusual bleeding, trouble breathing, redness and pain at the surgery site, drainage, fever, or pain not relieved by medication.    Your post-operative visit with Dr.                                       is: Date:                        Time:    Please call to schedule your post-operative visit.  Additional Instructions:

## 2022-09-03 NOTE — Progress Notes (Signed)
Peripherally Inserted Central Catheter Placement  The IV Nurse has discussed with the patient and/or persons authorized to consent for the patient, the purpose of this procedure and the potential benefits and risks involved with this procedure.  The benefits include less needle sticks, lab draws from the catheter, and the patient may be discharged home with the catheter. Risks include, but not limited to, infection, bleeding, blood clot (thrombus formation), and puncture of an artery; nerve damage and irregular heartbeat and possibility to perform a PICC exchange if needed/ordered by physician.  Alternatives to this procedure were also discussed.  Bard Power PICC patient education guide, fact sheet on infection prevention and patient information card has been provided to patient /or left at bedside.    PICC Placement Documentation  PICC Single Lumen 82/95/62 Right Basilic 35 cm 0 cm (Active)  Indication for Insertion or Continuance of Line Prolonged intravenous therapies 09/03/22 1337  Exposed Catheter (cm) 0 cm 09/03/22 1337  Site Assessment Clean, Dry, Intact 09/03/22 1337  Line Status Flushed;Saline locked;Blood return noted 09/03/22 1337  Dressing Type Transparent;Securing device 09/03/22 1337  Dressing Status Antimicrobial disc in place 09/03/22 1337  Dressing Intervention New dressing;Other (Comment) 09/03/22 1337  Dressing Change Due 09/10/22 09/03/22 1337       [name] 09/03/2022, 1:38 PM

## 2022-09-03 NOTE — Transfer of Care (Signed)
Immediate Anesthesia Transfer of Care Note  Patient: [name]  Procedure(s) Performed: BONE BIOPSY (Left: Foot)  Patient Location: PACU  Anesthesia Type:MAC  Level of Consciousness: awake, alert , and oriented  Airway & Oxygen Therapy: Patient Spontanous Breathing  Post-op Assessment: Report given to RN and Post -op Vital signs reviewed and stable  Post vital signs: Reviewed and stable  Last Vitals:  Vitals Value Taken Time  BP    Temp    Pulse    Resp    SpO2      Last Pain:  Vitals:   09/03/22 1300  TempSrc: Oral  PainSc: 0-No pain         Complications: No notable events documented.

## 2022-09-04 ENCOUNTER — Encounter: Payer: Self-pay | Admitting: Podiatry

## 2022-09-04 DIAGNOSIS — M86472 Chronic osteomyelitis with draining sinus, left ankle and foot: Secondary | ICD-10-CM

## 2022-09-04 NOTE — Op Note (Signed)
Patient Name: [name] DOB: 30-May-1951  MRN: [id number]   Date of Service: 09/03/2022  Surgeon: Dr. Lanae Crumbly, DPM Assistants: None Pre-operative Diagnosis:  Osteomyelitis left foot Post-operative Diagnosis:  Osteomyelitis left foot Procedures:  1) bone biopsy left foot Pathology/Specimens: ID Type Source Tests Collected by Time Destination  1 : THIRD METATARSAL LEFT FOOT Tissue PATH Bone biopsy SURGICAL PATHOLOGY [name], DPM 09/03/2022 1600   A : THIRD METATARSAL LEFT FOOT Tissue PATH Bone biopsy AEROBIC/ANAEROBIC CULTURE W GRAM STAIN (SURGICAL/DEEP WOUND) [name], DPM 09/03/2022 1601    Anesthesia: Local with MAC Hemostasis: * No tourniquets in log * Estimated Blood Loss: 5 cc Materials: * No implants in log * Medications: 6 cc 0.5% Marcaine plain Complications: No complication noted  Indications for Procedure:  This is a 72 y.o. female with a history of uncontrolled type 2 diabetes, previous infections and digital and metatarsal amputations.  She currently has a plantar nonhealing wound of the left foot.  Transmetatarsal amputation has been recommended which she does not want to proceed with.  She presents today for biopsy of the bone to help guide antibiotic therapy for PICC line which was placed today in preop.  Procedure in Detail: Patient was identified in pre-operative holding area. Formal consent was signed and the left lower extremity was marked. Patient was brought back to the operating room. Anesthesia was induced. The extremity was prepped and draped in the usual sterile fashion. Timeout was taken to confirm patient name, laterality, and procedure prior to incision.   Attention was then directed to the left foot where a linear incision was made over the third metatarsal head.  Blunt and sharp dissection was used to elevate the soft tissues from the metatarsal head.  A sample of the bone was harvested with a rongeur.  A large subchondral fragment  was easily retrieved with cartilage.  The bone was soft and friable and necrotic appearing.  Samples of the bone were sent for histopathology and tissue culture.  Once the sample was obtained the wound was irrigated and closed with 3-0 nylon  The foot was then dressed with Xeroform and dry sterile dressings. Patient tolerated the procedure well.   Disposition: Following a period of post-operative monitoring, patient will be transferred to home.  She remains at high risk of limb loss

## 2022-09-07 LAB — SURGICAL PATHOLOGY

## 2022-09-08 LAB — AEROBIC/ANAEROBIC CULTURE W GRAM STAIN (SURGICAL/DEEP WOUND)
Culture: NO GROWTH
Gram Stain: NONE SEEN

## 2022-09-13 ENCOUNTER — Ambulatory Visit (INDEPENDENT_AMBULATORY_CARE_PROVIDER_SITE_OTHER): Payer: Medicare Other | Admitting: Podiatry

## 2022-09-13 ENCOUNTER — Ambulatory Visit: Payer: Medicare Other | Admitting: Podiatry

## 2022-09-13 VITALS — BP 204/79 | HR 69

## 2022-09-13 DIAGNOSIS — M86172 Other acute osteomyelitis, left ankle and foot: Secondary | ICD-10-CM

## 2022-09-13 DIAGNOSIS — M86171 Other acute osteomyelitis, right ankle and foot: Secondary | ICD-10-CM

## 2022-09-14 NOTE — Progress Notes (Signed)
  Subjective:  Patient ID: [name], female    DOB: 07-Jun-1951,  MRN: [id number]  Chief Complaint  Patient presents with   Routine Post Op    "It doesn't hurt."    72 y.o. female presents with the above complaint. History confirmed with patient.  She received her PICC line and is receiving antibiotic she has follow-up with infectious disease this week, she says she has not had any pain  Objective:  Physical Exam: Both feet are fairly warm and well-perfused, there is a weakly palpable DP pulse on the left, nonpalpable pulses on the right, full-thickness ulceration submetatarsal 3 on the left foot measures 1.5 x 2.0 x 0.3 cm, unchanged size, no purulence no drainage no cellulitis, fibrogranular wound bed with hyperkeratosis surrounding with floppy toe of fourth toe previous fourth and fifth ray resections, lateral fourth toe ulcer full-thickness on the right measures 2.0 x 0.5 cm    Radiographs: Multiple views x-ray of both feet show no definitive osteolysis or osteomyelitis of the areas of concern   Right foot MRI IMPRESSION: 1. Marrow edema in the fourth middle and distal phalanx concerning for osteomyelitis. 2. 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     Electronically Signed   By: Kathreen Devoid M.D.   On: 08/11/2022 08:45    Left left foot MRI IMPRESSION: 1. Bone destruction with bone marrow edema in the third metatarsal head and distal shaft consistent with osteomyelitis. 2. Mild marrow edema and erosive changes involving the second metatarsal head concerning for osteomyelitis. 3. Cellulitis of the forefoot. No drainable fluid collection to suggest an abscess.     Electronically Signed   By: Kathreen Devoid M.D.   On: 08/11/2022 08:41    Pathology results with active osteomyelitis  Bone culture results with no growth final   Assessment:   1. Acute osteomyelitis of right ankle or foot ([place])   2. Acute osteomyelitis of left ankle or foot ([place])         Plan:  Patient was evaluated and treated and all questions answered.  Dressing was removed and inspected and the ulceration was debrided in excisional full-thickness manner with a chisel blade of nonviable tissue and hyperkeratosis.  We discussed the results of her biopsy results that there is in fact active osteomyelitis in the bone culture did not show any active organisms.  She has angiography scheduled next week for her right lower extremity.  At next visit we will plan for ongoing wound care and debridement of both ulcerations.  May need to consider total contact casting at some point.  In the interim I recommend she begin resuming regular wound care with mupirocin ointment daily changes.   No follow-ups on file.

## 2022-09-15 ENCOUNTER — Other Ambulatory Visit
Admission: RE | Admit: 2022-09-15 | Discharge: 2022-09-15 | Disposition: A | Discharge status: Home / Self Care | Payer: Medicare Other | Source: Ambulatory Visit | Attending: Infectious Diseases | Admitting: Infectious Diseases

## 2022-09-15 DIAGNOSIS — E1142 Type 2 diabetes mellitus with diabetic polyneuropathy: Secondary | ICD-10-CM | POA: Diagnosis not present

## 2022-09-15 DIAGNOSIS — M869 Osteomyelitis, unspecified: Secondary | ICD-10-CM | POA: Diagnosis present

## 2022-09-15 DIAGNOSIS — Z79899 Other long term (current) drug therapy: Secondary | ICD-10-CM | POA: Insufficient documentation

## 2022-09-15 LAB — VANCOMYCIN, TROUGH: Vancomycin Tr: 11 ug/mL — ABNORMAL LOW (ref 15–20)

## 2022-09-21 ENCOUNTER — Encounter: Payer: Self-pay | Admitting: Vascular Surgery

## 2022-09-21 ENCOUNTER — Ambulatory Visit: Payer: Medicare Other | Admitting: Anesthesiology

## 2022-09-21 ENCOUNTER — Other Ambulatory Visit: Payer: Self-pay

## 2022-09-21 ENCOUNTER — Ambulatory Visit
Admission: RE | Admit: 2022-09-21 | Discharge: 2022-09-21 | Disposition: A | Discharge status: Home / Self Care | Payer: Medicare Other | Attending: Vascular Surgery | Admitting: Vascular Surgery

## 2022-09-21 ENCOUNTER — Encounter
Admission: RE | Disposition: A | Discharge status: Home / Self Care | Payer: Self-pay | Source: Home / Self Care | Attending: Vascular Surgery

## 2022-09-21 DIAGNOSIS — Z7984 Long term (current) use of oral hypoglycemic drugs: Secondary | ICD-10-CM | POA: Insufficient documentation

## 2022-09-21 DIAGNOSIS — I700 Atherosclerosis of aorta: Secondary | ICD-10-CM | POA: Diagnosis not present

## 2022-09-21 DIAGNOSIS — L97519 Non-pressure chronic ulcer of other part of right foot with unspecified severity: Secondary | ICD-10-CM | POA: Insufficient documentation

## 2022-09-21 DIAGNOSIS — I2510 Atherosclerotic heart disease of native coronary artery without angina pectoris: Secondary | ICD-10-CM | POA: Insufficient documentation

## 2022-09-21 DIAGNOSIS — I10 Essential (primary) hypertension: Secondary | ICD-10-CM | POA: Diagnosis not present

## 2022-09-21 DIAGNOSIS — Z79899 Other long term (current) drug therapy: Secondary | ICD-10-CM | POA: Diagnosis not present

## 2022-09-21 DIAGNOSIS — Z86711 Personal history of pulmonary embolism: Secondary | ICD-10-CM | POA: Insufficient documentation

## 2022-09-21 DIAGNOSIS — Z955 Presence of coronary angioplasty implant and graft: Secondary | ICD-10-CM | POA: Insufficient documentation

## 2022-09-21 DIAGNOSIS — Z95828 Presence of other vascular implants and grafts: Secondary | ICD-10-CM

## 2022-09-21 DIAGNOSIS — M869 Osteomyelitis, unspecified: Secondary | ICD-10-CM | POA: Diagnosis not present

## 2022-09-21 DIAGNOSIS — I70235 Atherosclerosis of native arteries of right leg with ulceration of other part of foot: Secondary | ICD-10-CM | POA: Insufficient documentation

## 2022-09-21 DIAGNOSIS — E11621 Type 2 diabetes mellitus with foot ulcer: Secondary | ICD-10-CM | POA: Insufficient documentation

## 2022-09-21 DIAGNOSIS — Z951 Presence of aortocoronary bypass graft: Secondary | ICD-10-CM | POA: Diagnosis not present

## 2022-09-21 DIAGNOSIS — E1151 Type 2 diabetes mellitus with diabetic peripheral angiopathy without gangrene: Secondary | ICD-10-CM | POA: Diagnosis present

## 2022-09-21 DIAGNOSIS — Z7901 Long term (current) use of anticoagulants: Secondary | ICD-10-CM | POA: Diagnosis not present

## 2022-09-21 DIAGNOSIS — I70299 Other atherosclerosis of native arteries of extremities, unspecified extremity: Secondary | ICD-10-CM

## 2022-09-21 HISTORY — PX: LOWER EXTREMITY ANGIOGRAPHY: CATH118251

## 2022-09-21 LAB — BUN: BUN: 20 mg/dL (ref 8–23)

## 2022-09-21 LAB — GLUCOSE, CAPILLARY
Glucose-Capillary: 78 mg/dL (ref 70–99)
Glucose-Capillary: 115 mg/dL — ABNORMAL HIGH (ref 70–99)

## 2022-09-21 LAB — CREATININE, SERUM
Creatinine, Ser: 0.95 mg/dL (ref 0.44–1.00)
GFR, Estimated: >60 mL/min (ref >60)

## 2022-09-21 SURGERY — LOWER EXTREMITY ANGIOGRAPHY
Anesthesia: General | Site: Leg Lower | Laterality: Right

## 2022-09-21 MED ORDER — SODIUM CHLORIDE 0.9% FLUSH: 3.0000 mL | INTRAVENOUS | Status: DC | PRN

## 2022-09-21 MED ORDER — HEPARIN SODIUM (PORCINE) 1000 UNIT/ML IJ SOLN
INTRAMUSCULAR | Status: DC | PRN
  Administered 2022-09-21: 7000 [IU] via INTRAVENOUS

## 2022-09-21 MED ORDER — LACTATED RINGERS IV SOLN: INTRAVENOUS | Status: DC | PRN

## 2022-09-21 MED ORDER — DEXTROSE 5 % IV SOLN
INTRAVENOUS | Status: DC | PRN
  Administered 2022-09-21: 1 g via INTRAVENOUS

## 2022-09-21 MED ORDER — LABETALOL HCL 5 MG/ML IV SOLN
INTRAVENOUS | Status: AC
  Administered 2022-09-21: 10 mg via INTRAVENOUS
  Filled 2022-09-21: qty 4

## 2022-09-21 MED ORDER — ACETAMINOPHEN 10 MG/ML IV SOLN: 1000.0000 mg | Freq: Once | INTRAVENOUS | Status: DC | PRN

## 2022-09-21 MED ORDER — MIDAZOLAM HCL 2 MG/2ML IJ SOLN
INTRAMUSCULAR | Status: AC
  Filled 2022-09-21: qty 2

## 2022-09-21 MED ORDER — VANCOMYCIN HCL IN DEXTROSE 1-5 GM/200ML-% IV SOLN
1000.0000 mg | Freq: Once | INTRAVENOUS | Status: AC
  Administered 2022-09-21: 1000 mg via INTRAVENOUS

## 2022-09-21 MED ORDER — HYDRALAZINE HCL 20 MG/ML IJ SOLN: 5.0000 mg | INTRAMUSCULAR | Status: DC | PRN

## 2022-09-21 MED ORDER — LABETALOL HCL 5 MG/ML IV SOLN: 10.0000 mg | INTRAVENOUS | Status: DC | PRN

## 2022-09-21 MED ORDER — FENTANYL CITRATE (PF) 100 MCG/2ML IJ SOLN
INTRAMUSCULAR | Status: AC
  Filled 2022-09-21: qty 2

## 2022-09-21 MED ORDER — HYDROMORPHONE HCL 1 MG/ML IJ SOLN: 1.0000 mg | Freq: Once | INTRAMUSCULAR | Status: DC | PRN

## 2022-09-21 MED ORDER — LIDOCAINE HCL (CARDIAC) PF 100 MG/5ML IV SOSY
PREFILLED_SYRINGE | INTRAVENOUS | Status: DC | PRN
  Administered 2022-09-21: 80 mg via INTRAVENOUS

## 2022-09-21 MED ORDER — ONDANSETRON HCL 4 MG/2ML IJ SOLN: 4.0000 mg | Freq: Four times a day (QID) | INTRAMUSCULAR | Status: DC | PRN

## 2022-09-21 MED ORDER — DEXAMETHASONE SODIUM PHOSPHATE 10 MG/ML IJ SOLN
INTRAMUSCULAR | Status: AC
  Filled 2022-09-21: qty 1

## 2022-09-21 MED ORDER — MIDAZOLAM HCL 2 MG/2ML IJ SOLN
INTRAMUSCULAR | Status: DC | PRN
  Administered 2022-09-21: 2 mg via INTRAVENOUS

## 2022-09-21 MED ORDER — IODIXANOL 320 MG/ML IV SOLN
INTRAVENOUS | Status: DC | PRN
  Administered 2022-09-21: 70 mL

## 2022-09-21 MED ORDER — LACTATED RINGERS IV SOLN: INTRAVENOUS | Status: DC

## 2022-09-21 MED ORDER — PROPOFOL 10 MG/ML IV BOLUS
INTRAVENOUS | Status: AC
  Filled 2022-09-21: qty 20

## 2022-09-21 MED ORDER — ONDANSETRON HCL 4 MG/2ML IJ SOLN
INTRAMUSCULAR | Status: AC
  Filled 2022-09-21: qty 2

## 2022-09-21 MED ORDER — EPHEDRINE 5 MG/ML INJ
INTRAVENOUS | Status: AC
  Filled 2022-09-21: qty 10

## 2022-09-21 MED ORDER — FENTANYL CITRATE (PF) 100 MCG/2ML IJ SOLN
INTRAMUSCULAR | Status: DC | PRN
  Administered 2022-09-21: 50 ug via INTRAVENOUS

## 2022-09-21 MED ORDER — SODIUM CHLORIDE 0.9 % IV SOLN: INTRAVENOUS | Status: DC

## 2022-09-21 MED ORDER — CEFAZOLIN SODIUM-DEXTROSE 2-4 GM/100ML-% IV SOLN: 2.0000 g | INTRAVENOUS | Status: DC

## 2022-09-21 MED ORDER — FENTANYL CITRATE (PF) 100 MCG/2ML IJ SOLN: 25.0000 ug | INTRAMUSCULAR | Status: DC | PRN

## 2022-09-21 MED ORDER — FAMOTIDINE 20 MG PO TABS: 40.0000 mg | ORAL_TABLET | Freq: Once | ORAL | Status: DC | PRN

## 2022-09-21 MED ORDER — ONDANSETRON HCL 4 MG/2ML IJ SOLN
INTRAMUSCULAR | Status: DC | PRN
  Administered 2022-09-21: 4 mg via INTRAVENOUS

## 2022-09-21 MED ORDER — VANCOMYCIN HCL IN DEXTROSE 1-5 GM/200ML-% IV SOLN
INTRAVENOUS | Status: AC
  Filled 2022-09-21: qty 200

## 2022-09-21 MED ORDER — DIPHENHYDRAMINE HCL 50 MG/ML IJ SOLN: 50.0000 mg | Freq: Once | INTRAMUSCULAR | Status: DC | PRN

## 2022-09-21 MED ORDER — ACETAMINOPHEN 325 MG PO TABS: 650.0000 mg | ORAL_TABLET | ORAL | Status: DC | PRN

## 2022-09-21 MED ORDER — SODIUM CHLORIDE 0.9 % IV SOLN: 250.0000 mL | INTRAVENOUS | Status: DC | PRN

## 2022-09-21 MED ORDER — ONDANSETRON HCL 4 MG/2ML IJ SOLN: 4.0000 mg | Freq: Once | INTRAMUSCULAR | Status: DC | PRN

## 2022-09-21 MED ORDER — HEPARIN SODIUM (PORCINE) 1000 UNIT/ML IJ SOLN
INTRAMUSCULAR | Status: AC
  Filled 2022-09-21: qty 10

## 2022-09-21 MED ORDER — MORPHINE SULFATE (PF) 4 MG/ML IV SOLN: 2.0000 mg | INTRAVENOUS | Status: DC | PRN

## 2022-09-21 MED ORDER — DEXAMETHASONE SODIUM PHOSPHATE 10 MG/ML IJ SOLN
INTRAMUSCULAR | Status: DC | PRN
  Administered 2022-09-21: 5 mg via INTRAVENOUS

## 2022-09-21 MED ORDER — SODIUM CHLORIDE 0.9 % IV SOLN
1.0000 g | Freq: Once | INTRAVENOUS | Status: DC
  Filled 2022-09-21: qty 10

## 2022-09-21 MED ORDER — MIDAZOLAM HCL 2 MG/ML PO SYRP: 8.0000 mg | ORAL_SOLUTION | Freq: Once | ORAL | Status: DC | PRN

## 2022-09-21 MED ORDER — EPHEDRINE SULFATE (PRESSORS) 50 MG/ML IJ SOLN
INTRAMUSCULAR | Status: DC | PRN
  Administered 2022-09-21 (×2): 10 mg via INTRAVENOUS
  Administered 2022-09-21: 15 mg via INTRAVENOUS

## 2022-09-21 MED ORDER — LIDOCAINE HCL (PF) 2 % IJ SOLN
INTRAMUSCULAR | Status: AC
  Filled 2022-09-21: qty 5

## 2022-09-21 MED ORDER — METHYLPREDNISOLONE SODIUM SUCC 125 MG IJ SOLR: 125.0000 mg | Freq: Once | INTRAMUSCULAR | Status: DC | PRN

## 2022-09-21 MED ORDER — PROPOFOL 10 MG/ML IV BOLUS
INTRAVENOUS | Status: DC | PRN
  Administered 2022-09-21: 150 mg via INTRAVENOUS

## 2022-09-21 MED ORDER — SODIUM CHLORIDE 0.9% FLUSH: 3.0000 mL | Freq: Two times a day (BID) | INTRAVENOUS | Status: DC

## 2022-09-21 SURGICAL SUPPLY — 25 items
BALLN LUTONIX 018 4X100X130 (BALLOONS) ×1
BALLN LUTONIX DCB 6X40X130 (BALLOONS) ×1
BALLN ULTRSCOR 014 2.5X100X150 (BALLOONS) ×1
BALLOON LUTONIX 018 4X100X130 (BALLOONS) IMPLANT
BALLOON LUTONIX DCB 6X40X130 (BALLOONS) IMPLANT
BALLOON ULTRSC 014 2.5X100X150 (BALLOONS) IMPLANT
CATH ANGIO 5F PIGTAIL 65CM (CATHETERS) IMPLANT
CATH VERT 5X100 (CATHETERS) IMPLANT
COVER PROBE ULTRASOUND 5X96 (MISCELLANEOUS) IMPLANT
DEVICE STARCLOSE SE CLOSURE (Vascular Products) IMPLANT
GLIDEWIRE ADV .014X300CM (WIRE) IMPLANT
GLIDEWIRE ADV .035X260CM (WIRE) IMPLANT
GOWN STRL REUS W/ TWL LRG LVL3 (GOWN DISPOSABLE) ×2 IMPLANT
GOWN STRL REUS W/TWL LRG LVL3 (GOWN DISPOSABLE) ×1
KIT ENCORE 26 ADVANTAGE (KITS) IMPLANT
NDL ENTRY 21GA 7CM ECHOTIP (NEEDLE) IMPLANT
NEEDLE ENTRY 21GA 7CM ECHOTIP (NEEDLE) ×1 IMPLANT
PACK ANGIOGRAPHY (CUSTOM PROCEDURE TRAY) ×2 IMPLANT
SET INTRO CAPELLA COAXIAL (SET/KITS/TRAYS/PACK) IMPLANT
SHEATH BRITE TIP 5FRX11 (SHEATH) IMPLANT
SHEATH RAABE 6FR (SHEATH) IMPLANT
SYR MEDRAD MARK 7 150ML (SYRINGE) IMPLANT
TUBING CONTRAST HIGH PRESS 72 (TUBING) IMPLANT
WIRE G V18X300CM (WIRE) IMPLANT
WIRE GUIDERIGHT .035X150 (WIRE) IMPLANT

## 2022-09-21 NOTE — Op Note (Signed)
[facility]  Percutaneous Study/Intervention Procedural Note   Date of Surgery: 09/21/2022  Surgeon:  Hortencia Pilar  Pre-operative Diagnosis: Atherosclerotic occlusive disease bilateral lower extremities with right lower extremity with rest pain and ulceration.  Post-operative diagnosis:  Same  Procedure(s) Performed:             1.  Introduction catheter into right lower extremity 3rd order catheter placement               2.    Contrast injection right lower extremity for distal runoff             3.  Percutaneous transluminal angioplasty right superficial femoral and popliteal arteries to 6 mm proximally and 4 mm distally             4.  Percutaneous transluminal angioplasty of the tibioperoneal trunk to 3 mm with an ultra score balloon             5.  Star close closure left common femoral arteriotomy  Anesthesia: General anesthesia  Sheath: 6 [place] Rabie left common femoral retrograde  Contrast: 70 cc  Fluoroscopy Time: 6.7 minutes  Indications:  [name] presents with increasing pain of the right lower extremity.  She has an ulceration on the right foot and documented osteomyelitis which has not been improving with antibiotic therapy.  This suggests the patient is having limb threatening ischemia.  Angiography is recommended for limb salvage.  The risks and benefits are reviewed all questions answered patient agrees to proceed.  Procedure:   [name] is a 72 y.o. y.o. female who was identified and appropriate procedural time out was performed.  The patient was then placed supine on the table and prepped and draped in the usual sterile fashion.    Ultrasound was placed in the sterile sleeve and the left groin was evaluated the left common femoral artery was echolucent and pulsatile indicating patency.  Image was recorded for the permanent record and under real-time visualization a microneedle was inserted into the common femoral artery  followed by the microwire and then the micro-sheath.  A J-wire was then advanced through the micro-sheath and a  5 [place] sheath was then inserted over a J-wire. J-wire was then advanced and a 5 French pigtail catheter was positioned at the level of T12.  AP projection of the aorta was then obtained. Pigtail catheter was repositioned to above the bifurcation and a LAO view of the pelvis was obtained.  Subsequently a pigtail catheter with an Advantage wire was used to cross the aortic bifurcation.  The catheter and wire were advanced down into the right distal external iliac artery. Oblique view of the femoral bifurcation was then obtained and subsequently the wire was reintroduced and the pigtail catheter negotiated into the SFA representing third order catheter placement. Distal runoff was then performed.  6000 units of heparin was then given and allowed to circulate for several minutes.  A 6 French Rabie sheath was advanced up and over the bifurcation and positioned in the femoral artery  KMP catheter and advantage Glidewire were then negotiated down into the distal popliteal. Catheter was then advanced.   I elected to treat the focal 60 to 70% stenosis in the proximal SFA first.  A 6 mm x 40 mm Lutonix drug-eluting balloon was used to angioplasty the proximal SFA.  The inflation was for 1 minute at 12 atm. Follow-up imaging demonstrated patency with less than 10% residual stenosis.  The detector was then positioned distally and a 0.014 advantage wire negotiated through the multiple distal popliteal lesions as well as the greater than 90% stenosis in the tibioperoneal trunk. A 2.5 mm x 100 mm ultra score balloon was advanced into the tibioperoneal trunk.  Inflation was to 12 atmospheres for 1 minute.  Next a 3.0 mm x 100 mm ultra score balloon was advanced across the same lesion and inflated to 12 atm for 1 minute.  Follow-up imaging demonstrated patency with less than 10% residual stenosis.  2 of the distal  popliteal lesions still demonstrated greater than 50% residual stenosis and a 4 mm x 100 mm Lutonix drug-eluting balloon was advanced across the popliteal and inflated to 10 atm for 2 full minutes.  Follow-up imaging demonstrated less than 10% residual  stenosis throughout the popliteal and distal runoff was then reassessed and noted to be widely patent.    After review of these images the sheath is pulled into the left external iliac oblique of the common femoral is obtained and a Star close device deployed. There no immediate Complications.  Findings:  The abdominal aorta is opacified with a bolus injection contrast. Renal arteries are single and widely patent without evidence of hemodynamically significant stenosis left renal artery stent is widely patent.  The aorta itself has diffuse disease but no hemodynamically significant lesions. The common and external iliac arteries are widely patent bilaterally.  The right common femoral is widely patent as is the profunda femoris.  The SFA does indeed have a significant stenosis in its proximal segment there is a 60 to 70% stenosis that extends over approximately 20 mm.  The midportion of the SFA and the distal portion at Hunter's canal is diffusely diseased but there are no hemodynamically significant lesions noted.  The above-knee popliteal is diffusely diseased but again there are no hemodynamically significant stenoses.  The distal popliteal demonstrates increasing disease with 3 greater than 80% stenoses noted each of which extends over 10 to 20 mm in length.  They occur in series.  The trifurcation is heavily diseased with occlusion of the anterior tibial and posterior tibial.   The tibial peroneal trunk demonstrates a 90% stenosis in the proximal portion and the peroneal is actually widely patent all the way to the ankle with collaterals across the ankle.   Following angioplasty the tibioperoneal trunk demonstrates less than 10% residual stenosis.  The  distal popliteal demonstrates less than 10% residual stenosis.  Angioplasty of the SFA proximally yields an excellent result with less than 10% residual stenosis.  Summary: Successful recanalization right lower extremity for limb salvage                           Disposition: Patient was taken to the recovery room in stable condition having tolerated the procedure well.  [name] [name] 09/21/2022,2:32 PM
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
## 2022-09-21 NOTE — Transfer of Care (Signed)
Immediate Anesthesia Transfer of Care Note  Patient: [name]  Procedure(s) Performed: Lower Extremity Angiography (Right: Leg Lower)  Patient Location: PACU  Anesthesia Type:General  Level of Consciousness: drowsy and patient cooperative  Airway & Oxygen Therapy: Patient Spontanous Breathing and Patient connected to nasal cannula oxygen  Post-op Assessment: Report given to RN and Post -op Vital signs reviewed and stable  Post vital signs: Reviewed and stable  Last Vitals:  Vitals Value Taken Time  BP 178/78 09/21/22 1430  Temp    Pulse 66 09/21/22 1434  Resp 8 09/21/22 1434  SpO2 100 % 09/21/22 1434  Vitals shown include unvalidated device data.  Last Pain:  Vitals:   09/21/22 1225  TempSrc: Oral  PainSc: 0-No pain         Complications: No notable events documented.
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
## 2022-09-21 NOTE — Anesthesia Procedure Notes (Signed)
Procedure Name: LMA Insertion Date/Time: 09/21/2022 1:01 PM  Performed by: Hilbert Odor, CRNAPre-anesthesia Checklist: Patient identified, Patient being monitored, Timeout performed, Emergency Drugs available and Suction available Patient Re-evaluated:Patient Re-evaluated prior to induction Oxygen Delivery Method: Circle system utilized Preoxygenation: Pre-oxygenation with 100% oxygen Induction Type: IV induction Ventilation: Mask ventilation without difficulty LMA: LMA inserted LMA Size: 4.0 Tube type: Oral Number of attempts: 1 Placement Confirmation: positive ETCO2 and breath sounds checked- equal and bilateral Tube secured with: Tape Dental Injury: Teeth and Oropharynx as per pre-operative assessment

## 2022-09-21 NOTE — Interval H&P Note (Signed)
History and Physical Interval Note:  09/21/2022 12:26 PM  [name]  has presented today for surgery, with the diagnosis of RLE Angio  ANESTHESIA   [place]  ASO w ulceration.  The various methods of treatment have been discussed with the patient and family. After consideration of risks, benefits and other options for treatment, the patient has consented to  Procedure(s): Lower Extremity Angiography (Right) as a surgical intervention.  The patient's history has been reviewed, patient examined, no change in status, stable for surgery.  I have reviewed the patient's chart and labs.  Questions were answered to the patient's satisfaction.     Hortencia Pilar

## 2022-09-21 NOTE — Anesthesia Preprocedure Evaluation (Addendum)
Anesthesia Evaluation  Patient identified by MRN, date of birth, ID band Patient awake    Reviewed: Allergy & Precautions, NPO status , Patient's Chart, lab work & pertinent test results  History of Anesthesia Complications Negative for: history of anesthetic complications  Airway Mallampati: III   Neck ROM: Full    Dental no notable dental hx.    Pulmonary neg pulmonary ROS   Pulmonary exam normal breath sounds clear to auscultation       Cardiovascular hypertension, + CAD (s/p stents and CABG), + Peripheral Vascular Disease and +CHF  Normal cardiovascular exam+ dysrhythmias (a fib on Plavix)  Rhythm:Regular Rate:Normal  Hx PE  ECG 02/11/22: accelerated junctional rhythm with occasional PVCs   Neuro/Psych  PSYCHIATRIC DISORDERS Anxiety Depression     Neuromuscular disease (diabetic polyneuropathy)    GI/Hepatic negative GI ROS,,,  Endo/Other  diabetes, Type 2    Renal/GU negative Renal ROS     Musculoskeletal   Abdominal   Peds  Hematology negative hematology ROS (+)   Anesthesia Other Findings Cardiology note 04/07/22:  1. Atherosclerosis of artery extremity with ulceration -s/p angiogram with PTCA 08/2017 of SFA and popliteal arteries on the right also right posterior tibial artery -continue current medication regimen   2.  Diabetes mellitus type 2 -per PCP   3. Coronary artery disease status post bypass grafting -No chest pain or SOB -Continue GDMT: ASA 81 mg, atenolol 50 mg daily, Plavix 75 mg daily, Crestor 20 mg daily, Xarelto 2.5 mg twice daily, as needed nitroglycerin, hydralazine 25 mg twice daily   4. 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  She is off this medication now due to surgery tomorrow.   5.  Essential hypertension -much better today 140/64 -Continue current medication regimen   Disposition: Follow up  3-4 months  with Ida Rogue, MD or  APP.   Reproductive/Obstetrics                             Anesthesia Physical Anesthesia Plan  ASA: 3  Anesthesia Plan: General   Post-op Pain Management:    Induction: Intravenous  PONV Risk Score and Plan: 3 and Ondansetron, Dexamethasone and Treatment may vary due to age or medical condition  Airway Management Planned: LMA  Additional Equipment:   Intra-op Plan:   Post-operative Plan: Extubation in OR  Informed Consent: I have reviewed the patients History and Physical, chart, labs and discussed the procedure including the risks, benefits and alternatives for the proposed anesthesia with the patient or authorized representative who has indicated his/her understanding and acceptance.     Dental advisory given  Plan Discussed with: CRNA  Anesthesia Plan Comments: (Patient consented for risks of anesthesia including but not limited to:  - adverse reactions to medications - damage to eyes, teeth, lips or other oral mucosa - nerve damage due to positioning  - sore throat or hoarseness - damage to heart, brain, nerves, lungs, other parts of body or loss of life  Informed patient about role of CRNA in peri- and intra-operative care.  Patient voiced understanding.)        Anesthesia Quick Evaluation

## 2022-09-22 ENCOUNTER — Encounter: Payer: Self-pay | Admitting: Vascular Surgery

## 2022-09-22 NOTE — Anesthesia Postprocedure Evaluation (Signed)
Anesthesia Post Note  Patient: [name]  Procedure(s) Performed: Lower Extremity Angiography (Right: Leg Lower)  Patient location during evaluation: Specials Recovery Anesthesia Type: General Level of consciousness: awake and alert Pain management: pain level controlled Vital Signs Assessment: post-procedure vital signs reviewed and stable Respiratory status: spontaneous breathing, nonlabored ventilation, respiratory function stable and patient connected to nasal cannula oxygen Cardiovascular status: blood pressure returned to baseline and stable Postop Assessment: no apparent nausea or vomiting Anesthetic complications: no   No notable events documented.   Last Vitals:  Vitals:   09/21/22 1545 09/21/22 1600  BP: (!) 164/69   Pulse: 74   Resp: 19   Temp:    SpO2: 96% 97%    Last Pain:  Vitals:   09/21/22 1600  TempSrc:   PainSc: 0-No pain                 Dimas Millin

## 2022-09-27 ENCOUNTER — Encounter: Payer: Medicare Other | Admitting: Podiatry

## 2022-10-04 ENCOUNTER — Encounter: Payer: Medicare Other | Admitting: Podiatry

## 2022-10-05 ENCOUNTER — Telehealth: Payer: Self-pay | Admitting: Podiatry

## 2022-10-05 NOTE — Telephone Encounter (Signed)
Pt called and is scheduled to see Dr Sherryle Lis on 3.11 in [place] but still has stitches in and was asking for a sooner appt.  I offered her appt for 2.28 in [place] but she has another appt somewhere else. She is scheduled to see Dr Sherryle Lis 3.6 in [facility] office.Marland KitchenMarland Kitchen

## 2022-10-13 ENCOUNTER — Encounter: Payer: Medicare Other | Admitting: Podiatry

## 2022-10-14 ENCOUNTER — Other Ambulatory Visit (INDEPENDENT_AMBULATORY_CARE_PROVIDER_SITE_OTHER): Payer: Self-pay | Admitting: Vascular Surgery

## 2022-10-14 DIAGNOSIS — Z9889 Other specified postprocedural states: Secondary | ICD-10-CM
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
## 2022-10-18 ENCOUNTER — Encounter: Payer: Self-pay | Admitting: Podiatry

## 2022-10-18 ENCOUNTER — Ambulatory Visit (INDEPENDENT_AMBULATORY_CARE_PROVIDER_SITE_OTHER): Payer: Medicare Other | Admitting: Vascular Surgery

## 2022-10-18 ENCOUNTER — Encounter (INDEPENDENT_AMBULATORY_CARE_PROVIDER_SITE_OTHER): Payer: Self-pay | Admitting: Vascular Surgery

## 2022-10-18 ENCOUNTER — Ambulatory Visit (INDEPENDENT_AMBULATORY_CARE_PROVIDER_SITE_OTHER): Payer: Medicare Other

## 2022-10-18 ENCOUNTER — Ambulatory Visit (INDEPENDENT_AMBULATORY_CARE_PROVIDER_SITE_OTHER): Payer: Medicare Other | Admitting: Podiatry

## 2022-10-18 VITALS — BP 179/66 | HR 68 | Resp 16 | Wt 170.0 lb

## 2022-10-18 DIAGNOSIS — Z9889 Other specified postprocedural states: Secondary | ICD-10-CM

## 2022-10-18 DIAGNOSIS — M86171 Other acute osteomyelitis, right ankle and foot: Secondary | ICD-10-CM | POA: Diagnosis not present

## 2022-10-18 DIAGNOSIS — L97522 Non-pressure chronic ulcer of other part of left foot with fat layer exposed: Secondary | ICD-10-CM | POA: Diagnosis not present

## 2022-10-18 DIAGNOSIS — L97909 Non-pressure chronic ulcer of unspecified part of unspecified lower leg with unspecified severity: Secondary | ICD-10-CM

## 2022-10-18 DIAGNOSIS — I739 Peripheral vascular disease, unspecified: Secondary | ICD-10-CM

## 2022-10-18 DIAGNOSIS — M86172 Other acute osteomyelitis, left ankle and foot: Secondary | ICD-10-CM | POA: Diagnosis not present

## 2022-10-18 DIAGNOSIS — E1151 Type 2 diabetes mellitus with diabetic peripheral angiopathy without gangrene: Secondary | ICD-10-CM

## 2022-10-18 DIAGNOSIS — I25708 Atherosclerosis of coronary artery bypass graft(s), unspecified, with other forms of angina pectoris: Secondary | ICD-10-CM | POA: Diagnosis not present

## 2022-10-18 DIAGNOSIS — E782 Mixed hyperlipidemia: Secondary | ICD-10-CM | POA: Diagnosis not present

## 2022-10-18 DIAGNOSIS — I70299 Other atherosclerosis of native arteries of extremities, unspecified extremity: Secondary | ICD-10-CM

## 2022-10-18 LAB — VAS US ABI WITH/WO TBI

## 2022-10-19 ENCOUNTER — Encounter: Payer: Self-pay | Admitting: Podiatry

## 2022-10-19 NOTE — Progress Notes (Signed)
  Subjective:  Patient ID: [name], female    DOB: 1951/05/09,  MRN: [id number]  Chief Complaint  Patient presents with   Routine Post Op    "I think it's doing okay.  I pray there's no osteomyelitis."    72 y.o. female presents with the above complaint. History confirmed with patient.  She is still receiving another 2 weeks of IV antibiotics and then is going to oral antibiotics Objective:  Physical Exam: Both feet are fairly warm and well-perfused, there is a weakly palpable DP pulse on the left, nonpalpable pulses on the right, full-thickness ulceration submetatarsal 3 on the left foot measures 1.5 x 1.0 x 0.3 cm, unchanged size, no purulence no drainage no cellulitis, fibrogranular wound bed with hyperkeratosis surrounding with floppy toe of fourth toe previous fourth and fifth ray resections, lateral fourth toe ulcer full-thickness on the right measures 2.0 x 0.5 cm with exposed bone and joint      Radiographs: Multiple views x-ray of both feet show osteolysis of the right fourth toe distal phalanx, postsurgical changes of left third metatarsal head   Right foot MRI IMPRESSION: 1. Marrow edema in the fourth middle and distal phalanx concerning for osteomyelitis. 2. 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     Electronically Signed   By: Kathreen Devoid M.D.   On: 08/11/2022 08:45    Left left foot MRI IMPRESSION: 1. Bone destruction with bone marrow edema in the third metatarsal head and distal shaft consistent with osteomyelitis. 2. Mild marrow edema and erosive changes involving the second metatarsal head concerning for osteomyelitis. 3. Cellulitis of the forefoot. No drainable fluid collection to suggest an abscess.     Electronically Signed   By: Kathreen Devoid M.D.   On: 08/11/2022 08:41    Pathology results with active osteomyelitis  Bone culture results with no growth final   Assessment:   1. Acute osteomyelitis of right ankle or foot  ([place])   2. Acute osteomyelitis of left ankle or foot ([place])   3. Ulcer of left foot with fat layer exposed ([place])        Plan:  Patient was evaluated and treated and all questions answered.  Dressing was removed and inspected and the ulceration was debrided in excisional full-thickness manner with a chisel blade of nonviable tissue and hyperkeratosis.  This was completed to the subcutaneous layer.  Postdebridement measurements are noted above.  Although angiography has improved on the right side, her wound has deteriorated to the point of osteomyelitis developing in the fourth toe.  She will need amputation of the digit which I discussed with her.  I would like to see how her left foot wound progresses prior to scheduling this in the event any further surgical intervention at this time needs to be undertaken such as a skin substitute application.  I will see her back in 2 weeks for follow-up.  Today following debridement the left foot wound was dressed with Puracol and dry sterile dressings, she will change this daily.  Continue using mupirocin ointment on the right fourth toe.  No follow-ups on file.

## 2022-10-25 ENCOUNTER — Encounter: Payer: Medicare Other | Admitting: Podiatry

## 2022-11-08 ENCOUNTER — Ambulatory Visit (INDEPENDENT_AMBULATORY_CARE_PROVIDER_SITE_OTHER): Payer: Medicare Other | Admitting: Podiatry

## 2022-11-08 DIAGNOSIS — L97522 Non-pressure chronic ulcer of other part of left foot with fat layer exposed: Secondary | ICD-10-CM

## 2022-11-08 DIAGNOSIS — M86171 Other acute osteomyelitis, right ankle and foot: Secondary | ICD-10-CM | POA: Diagnosis not present

## 2022-11-08 DIAGNOSIS — M86172 Other acute osteomyelitis, left ankle and foot: Secondary | ICD-10-CM | POA: Diagnosis not present

## 2022-11-09 ENCOUNTER — Telehealth: Payer: Self-pay

## 2022-11-09 ENCOUNTER — Encounter: Payer: Self-pay | Admitting: Podiatry

## 2022-11-09 ENCOUNTER — Other Ambulatory Visit (INDEPENDENT_AMBULATORY_CARE_PROVIDER_SITE_OTHER): Payer: Self-pay | Admitting: Nurse Practitioner

## 2022-11-09 NOTE — Telephone Encounter (Signed)
[name] is scheduled for surgery on 11/26/2022. She is currently taking trulicity. Her last dose should be on 11/16/2022. She stated she understood.

## 2022-11-09 NOTE — Progress Notes (Signed)
  Subjective:  Patient ID: [name], female    DOB: [birth year],  MRN: [id number]  Chief Complaint  Patient presents with   Foot Ulcer    POV #5 DOS 09/03/2022 BONE BIOPSY LEFT / WOUND CHECK RIGHT    72 y.o. female presents with the above complaint. History confirmed with patient.  She is still taking vancomycin and ceftriaxone  Objective:  Physical Exam: Both feet are fairly warm and well-perfused, there is a weakly palpable DP pulse on the left, nonpalpable pulses on the right, full-thickness ulceration submetatarsal 3 on the left foot measures 1.5 x 1.0 x 0.3 cm, ulceration fifth metatarsal base measures 0.4 x 0.4 cm, unchanged size, no purulence no drainage no cellulitis, fibrogranular wound bed with hyperkeratosis surrounding with floppy toe of fourth toe previous fourth and fifth ray resections, lateral fourth toe ulcer full-thickness on the right measures 2.0 x 0.5 cm with exposed bone and joint      Radiographs: Multiple views x-ray of both feet show osteolysis of the right fourth toe distal phalanx, postsurgical changes of left third metatarsal head   Right foot MRI IMPRESSION: 1. Marrow edema in the fourth middle and distal phalanx concerning for osteomyelitis. 2. 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     Electronically Signed   By: Kathreen Devoid M.D.   On: 08/11/2022 08:45    Left left foot MRI IMPRESSION: 1. Bone destruction with bone marrow edema in the third metatarsal head and distal shaft consistent with osteomyelitis. 2. Mild marrow edema and erosive changes involving the second metatarsal head concerning for osteomyelitis. 3. Cellulitis of the forefoot. No drainable fluid collection to suggest an abscess.     Electronically Signed   By: Kathreen Devoid M.D.   On: 08/11/2022 08:41    Pathology results with active osteomyelitis  Bone culture results with no growth final   Assessment:   1. Acute osteomyelitis of right ankle or foot    2. Acute osteomyelitis of left ankle or foot   3. Ulcer of left foot with fat layer exposed        Plan:  Patient was evaluated and treated and all questions answered.  Dressing was removed and inspected and the ulceration was debrided in excisional full-thickness manner with a chisel blade of nonviable tissue and hyperkeratosis.  This was completed to the subcutaneous layer.  Postdebridement measurements are noted above.   Continue treating at home with mupirocin, they were changed with Iodosorb today.  We discussed further treatment.  I do not see a viable way to salvage the fourth toe.  I recommended amputation of the right fourth toe.  We discussed at the same time we can proceed with debridement of the left foot wounds and apply a soft tissue skin substitute.  We discussed the risk benefits and potential complications of this procedure.  All questions were addressed.  Informed sent was signed and reviewed.  Outpatient surgery has been scheduled.   Surgical plan:  Procedure: -Right fourth toe amputation, left foot wound debridement and skin substitute application  Location: -[facility]  Anesthesia plan: -IV sedation with local  Postoperative pain plan: - Tylenol 1000 mg every 6 hours   DVT prophylaxis: -None required  WB Restrictions / DME needs: -WBAT in surgical shoe bilateral   No follow-ups on file.

## 2022-11-18 ENCOUNTER — Inpatient Hospital Stay
Admission: RE | Admit: 2022-11-18 | Discharge: 2022-11-18 | Disposition: A | Discharge status: Home / Self Care | Payer: Medicare Other | Source: Ambulatory Visit

## 2022-11-18 ENCOUNTER — Ambulatory Visit (INDEPENDENT_AMBULATORY_CARE_PROVIDER_SITE_OTHER): Payer: Medicare Other | Admitting: Vascular Surgery

## 2022-11-18 HISTORY — DX: Angina pectoris, unspecified: I20.9

## 2022-11-18 HISTORY — DX: Age-related nuclear cataract, unspecified eye: H25.10

## 2022-11-18 HISTORY — DX: Other chronic osteomyelitis, unspecified ankle and foot: M86.679

## 2022-11-18 HISTORY — DX: Cellulitis, unspecified: L03.90

## 2022-11-18 HISTORY — DX: Heart failure, unspecified: I50.9

## 2022-11-18 HISTORY — DX: Other specified complication of cardiac prosthetic devices, implants and grafts, initial encounter: T82.897A

## 2022-11-18 NOTE — Pre-Procedure Instructions (Addendum)
Called pt x 2 to do pre op interview and left message on cell-Called Husband who answered and said that pts phone has been acting up and he will give her the number and tell her to call us back once he gets home

## 2022-11-19 ENCOUNTER — Encounter
Admission: RE | Admit: 2022-11-19 | Discharge: 2022-11-19 | Disposition: A | Discharge status: Home / Self Care | Payer: Medicare Other | Source: Ambulatory Visit | Attending: Podiatry | Admitting: Podiatry

## 2022-11-19 ENCOUNTER — Telehealth: Payer: Self-pay | Admitting: Podiatry

## 2022-11-19 ENCOUNTER — Other Ambulatory Visit: Payer: Medicare Other

## 2022-11-19 VITALS — Ht 68.0 in | Wt 169.8 lb

## 2022-11-19 DIAGNOSIS — I10 Essential (primary) hypertension: Secondary | ICD-10-CM

## 2022-11-19 DIAGNOSIS — Z01810 Encounter for preprocedural cardiovascular examination: Secondary | ICD-10-CM

## 2022-11-19 DIAGNOSIS — Z951 Presence of aortocoronary bypass graft: Secondary | ICD-10-CM

## 2022-11-19 DIAGNOSIS — Z01818 Encounter for other preprocedural examination: Secondary | ICD-10-CM

## 2022-11-19 DIAGNOSIS — Z01812 Encounter for preprocedural laboratory examination: Secondary | ICD-10-CM

## 2022-11-19 DIAGNOSIS — D649 Anemia, unspecified: Secondary | ICD-10-CM

## 2022-11-19 DIAGNOSIS — E1151 Type 2 diabetes mellitus with diabetic peripheral angiopathy without gangrene: Secondary | ICD-10-CM

## 2022-11-19 NOTE — Telephone Encounter (Signed)
Pt stated that she needs her Rx for shoes faxed in to [facility] on [address]. The fax number is [phone]. Please advise

## 2022-11-19 NOTE — Patient Instructions (Addendum)
Your procedure is scheduled on:11-26-22 Friday Report to the Registration Desk on the 1st floor of the [facility].Then proceed to the 2nd floor Surgery Desk To find out your arrival time, please call [phone] between 1PM - 3PM on:11-25-22 Thursday If your arrival time is 6:00 am, do not arrive before that time as the [facility] entrance doors do not open until 6:00 am.  REMEMBER: Instructions that are not followed completely may result in serious medical risk, up to and including death; or upon the discretion of your surgeon and anesthesiologist your surgery may need to be rescheduled.  Do not eat food OR drink any liquids after midnight the night before surgery.  No gum chewing or hard candies.  In addition, your doctor has ordered for you to drink the provided:  Gatorade G2 Drinking this carbohydrate drink up to two hours before surgery helps to reduce insulin resistance and improve patient outcomes. Please complete drinking 2 hours before scheduled arrival time.  One week prior to surgery: Stop Anti-inflammatories (NSAIDS) such as Advil, Aleve, Ibuprofen, Motrin, Naproxen, Naprosyn and Aspirin based products such as Excedrin, Goody's Powder, BC Powder.You may however, continue to take Tylenol if needed for pain up until the day of surgery.  Stop ANY OVER THE COUNTER supplements/vitamins NOW (11-19-22) until after surgery (Cinnamon)  Stop your 81 mg Aspirin and your clopidogrel (PLAVIX) 5 days prior as instructed to you in Dr Milas Gain office-Last dose will be on 11-20-22 Saturday  Stop your Dulaglutide (TRULICITY) 7 days prior to surgery-Last dose was on 11-16-22-Do NOT take again until AFTER your surgery  TAKE ONLY THESE MEDICATIONS THE MORNING OF SURGERY WITH A SIP OF WATER: -amLODipine (NORVASC)  -atenolol (TENORMIN)  -guaiFENesin (MUCINEX)  -rosuvastatin (CRESTOR)   No Alcohol for 24 hours before or after surgery.  No Smoking including e-cigarettes for 24 hours before  surgery.  No chewable tobacco products for at least 6 hours before surgery.  No nicotine patches on the day of surgery.  Do not use any "recreational" drugs for at least a week (preferably 2 weeks) before your surgery.  Please be advised that the combination of cocaine and anesthesia may have negative outcomes, up to and including death. If you test positive for cocaine, your surgery will be cancelled.  On the morning of surgery brush your teeth with toothpaste and water, you may rinse your mouth with mouthwash if you wish. Do not swallow any toothpaste or mouthwash.  Use CHG Soap as directed on instruction sheet.  Do not wear jewelry, make-up, hairpins, clips or nail polish.  Do not wear lotions, powders, or perfumes.   Do not shave body hair from the neck down 48 hours before surgery.  Contact lenses, hearing aids and dentures may not be worn into surgery.  Do not bring valuables to the hospital. [facility] is not responsible for any missing/lost belongings or valuables.   Notify your doctor if there is any change in your medical condition (cold, fever, infection).  Wear comfortable clothing (specific to your surgery type) to the hospital.  After surgery, you can help prevent lung complications by doing breathing exercises.  Take deep breaths and cough every 1-2 hours. Your doctor may order a device called an Incentive Spirometer to help you take deep breaths. When coughing or sneezing, hold a pillow firmly against your incision with both hands. This is called "splinting." Doing this helps protect your incision. It also decreases belly discomfort.  If you are being admitted to the hospital  overnight, leave your suitcase in the car. After surgery it may be brought to your room.  In case of increased patient census, it may be necessary for you, the patient, to continue your postoperative care in the Same Day Surgery department.  If you are being discharged the day of surgery, you  will not be allowed to drive home. You will need a responsible individual to drive you home and stay with you for 24 hours after surgery.   If you are taking public transportation, you will need to have a responsible individual with you.  Please call the Pre-admissions Testing Dept. at [phone] if you have any questions about these instructions.  Surgery Visitation Policy:  Patients having surgery or a procedure may have two visitors.  Children under the age of 54 must have an adult with them who is not the patient.     Preparing for Surgery with CHLORHEXIDINE GLUCONATE (CHG) Soap  Chlorhexidine Gluconate (CHG) Soap  o An antiseptic cleaner that kills germs and bonds with the skin to continue killing germs even after washing  o Used for showering the night before surgery and morning of surgery  Before surgery, you can play an important role by reducing the number of germs on your skin.  CHG (Chlorhexidine gluconate) soap is an antiseptic cleanser which kills germs and bonds with the skin to continue killing germs even after washing.  Please do not use if you have an allergy to CHG or antibacterial soaps. If your skin becomes reddened/irritated stop using the CHG.  1. Shower the NIGHT BEFORE SURGERY and the MORNING OF SURGERY with CHG soap.  2. If you choose to wash your hair, wash your hair first as usual with your normal shampoo.  3. After shampooing, rinse your hair and body thoroughly to remove the shampoo.  4. Use CHG as you would any other liquid soap. You can apply CHG directly to the skin and wash gently with a scrungie or a clean washcloth.  5. Apply the CHG soap to your body only from the neck down. Do not use on open wounds or open sores. Avoid contact with your eyes, ears, mouth, and genitals (private parts). Wash face and genitals (private parts) with your normal soap.  6. Wash thoroughly, paying special attention to the area where your surgery will be  performed.  7. Thoroughly rinse your body with warm water.  8. Do not shower/wash with your normal soap after using and rinsing off the CHG soap.  9. Pat yourself dry with a clean towel.  10. Wear clean pajamas to bed the night before surgery.  12. Place clean sheets on your bed the night of your first shower and do not sleep with pets.  13. Shower again with the CHG soap on the day of surgery prior to arriving at the hospital.  14. Do not apply any deodorants/lotions/powders.  15. Please wear clean clothes to the hospital.  How to Use an Incentive Spirometer An incentive spirometer is a tool that measures how well you are filling your lungs with each breath. Learning to take long, deep breaths using this tool can help you keep your lungs clear and active. This may help to reverse or lessen your chance of developing breathing (pulmonary) problems, especially infection. You may be asked to use a spirometer: After a surgery. If you have a lung problem or a history of smoking. After a long period of time when you have been unable to move or be  active. If the spirometer includes an indicator to show the highest number that you have reached, your health care provider or respiratory therapist will help you set a goal. Keep a log of your progress as told by your health care provider. What are the risks? Breathing too quickly may cause dizziness or cause you to pass out. Take your time so you do not get dizzy or light-headed. If you are in pain, you may need to take pain medicine before doing incentive spirometry. It is harder to take a deep breath if you are having pain. How to use your incentive spirometer  Sit up on the edge of your bed or on a chair. Hold the incentive spirometer so that it is in an upright position. Before you use the spirometer, breathe out normally. Place the mouthpiece in your mouth. Make sure your lips are closed tightly around it. Breathe in slowly and as deeply as  you can through your mouth, causing the piston or the ball to rise toward the top of the chamber. Hold your breath for 3-5 seconds, or for as long as possible. If the spirometer includes a coach indicator, use this to guide you in breathing. Slow down your breathing if the indicator goes above the marked areas. Remove the mouthpiece from your mouth and breathe out normally. The piston or ball will return to the bottom of the chamber. Rest for a few seconds, then repeat the steps 10 or more times. Take your time and take a few normal breaths between deep breaths so that you do not get dizzy or light-headed. Do this every 1-2 hours when you are awake. If the spirometer includes a goal marker to show the highest number you have reached (best effort), use this as a goal to work toward during each repetition. After each set of 10 deep breaths, cough a few times. This will help to make sure that your lungs are clear. If you have an incision on your chest or abdomen from surgery, place a pillow or a rolled-up towel firmly against the incision when you cough. This can help to reduce pain while taking deep breaths and coughing. General tips When you are able to get out of bed: Walk around often. Continue to take deep breaths and cough in order to clear your lungs. Keep using the incentive spirometer until your health care provider says it is okay to stop using it. If you have been in the hospital, you may be told to keep using the spirometer at home. Contact a health care provider if: You are having difficulty using the spirometer. You have trouble using the spirometer as often as instructed. Your pain medicine is not giving enough relief for you to use the spirometer as told. You have a fever. Get help right away if: You develop shortness of breath. You develop a cough with bloody mucus from the lungs. You have fluid or blood coming from an incision site after you cough. Summary An incentive  spirometer is a tool that can help you learn to take long, deep breaths to keep your lungs clear and active. You may be asked to use a spirometer after a surgery, if you have a lung problem or a history of smoking, or if you have been inactive for a long period of time. Use your incentive spirometer as instructed every 1-2 hours while you are awake. If you have an incision on your chest or abdomen, place a pillow or a rolled-up towel firmly against  your incision when you cough. This will help to reduce pain. Get help right away if you have shortness of breath, you cough up bloody mucus, or blood comes from your incision when you cough. This information is not intended to replace advice given to you by your health care provider. Make sure you discuss any questions you have with your health care provider. Document Revised: 10/15/2019 Document Reviewed: 10/15/2019 Elsevier Patient Education  2023 [employer].

## 2022-11-22 ENCOUNTER — Encounter
Admission: RE | Admit: 2022-11-22 | Discharge: 2022-11-22 | Disposition: A | Discharge status: Home / Self Care | Payer: Medicare Other | Source: Ambulatory Visit | Attending: Podiatry | Admitting: Podiatry

## 2022-11-22 DIAGNOSIS — Z01812 Encounter for preprocedural laboratory examination: Secondary | ICD-10-CM

## 2022-11-22 DIAGNOSIS — E1151 Type 2 diabetes mellitus with diabetic peripheral angiopathy without gangrene: Secondary | ICD-10-CM | POA: Diagnosis not present

## 2022-11-22 DIAGNOSIS — I10 Essential (primary) hypertension: Secondary | ICD-10-CM | POA: Insufficient documentation

## 2022-11-22 DIAGNOSIS — Z01818 Encounter for other preprocedural examination: Secondary | ICD-10-CM | POA: Diagnosis present

## 2022-11-22 DIAGNOSIS — Z01810 Encounter for preprocedural cardiovascular examination: Secondary | ICD-10-CM

## 2022-11-22 DIAGNOSIS — Z951 Presence of aortocoronary bypass graft: Secondary | ICD-10-CM

## 2022-11-22 DIAGNOSIS — D649 Anemia, unspecified: Secondary | ICD-10-CM | POA: Diagnosis not present

## 2022-11-22 LAB — CBC
HCT: 36.9 % (ref 36.0–46.0)
Hemoglobin: 11.8 g/dL — ABNORMAL LOW (ref 12.0–15.0)
MCH: 27.8 pg (ref 26.0–34.0)
MCHC: 32 g/dL (ref 30.0–36.0)
MCV: 86.8 fL (ref 80.0–100.0)
Platelets: 225 10*3/uL (ref 150–400)
RBC: 4.25 MIL/uL (ref 3.87–5.11)
RDW: 13 % (ref 11.5–15.5)
WBC: 5.9 10*3/uL (ref 4.0–10.5)
nRBC: 0 % (ref 0.0–0.2)

## 2022-11-22 LAB — BASIC METABOLIC PANEL
Anion gap: 8 (ref 5–15)
BUN: 37 mg/dL — ABNORMAL HIGH (ref 8–23)
CO2: 25 mmol/L (ref 22–32)
Calcium: 9 mg/dL (ref 8.9–10.3)
Chloride: 106 mmol/L (ref 98–111)
Creatinine, Ser: 1.04 mg/dL — ABNORMAL HIGH (ref 0.44–1.00)
GFR, Estimated: 57 mL/min — ABNORMAL LOW (ref >60)
Glucose, Bld: 139 mg/dL — ABNORMAL HIGH (ref 70–99)
Potassium: 3.8 mmol/L (ref 3.5–5.1)
Sodium: 139 mmol/L (ref 135–145)

## 2022-11-23 LAB — HEMOGLOBIN A1C
Hgb A1c MFr Bld: 6.6 % — ABNORMAL HIGH (ref 4.8–5.6)
Mean Plasma Glucose: 142.72 mg/dL

## 2022-11-23 NOTE — Telephone Encounter (Signed)
Pt stated that she currently has shoes that are wore out. [facility] allows her to use her insurance to pay for the shoes. She stated that the shoes look like regular shoes & are very comfortable. She stated the shoes would be wore after she has healed; she just needs the Rx stating that she is under your care. She said that she can't afford to get the shoes w/o using her insurance.

## 2022-11-26 ENCOUNTER — Ambulatory Visit: Payer: Medicare Other | Admitting: Urgent Care

## 2022-11-26 ENCOUNTER — Ambulatory Visit
Admission: RE | Admit: 2022-11-26 | Discharge: 2022-11-26 | Disposition: A | Discharge status: Home / Self Care | Payer: Medicare Other | Attending: Podiatry | Admitting: Podiatry

## 2022-11-26 ENCOUNTER — Encounter: Payer: Self-pay | Admitting: Podiatry

## 2022-11-26 ENCOUNTER — Other Ambulatory Visit: Payer: Self-pay

## 2022-11-26 ENCOUNTER — Encounter
Admission: RE | Disposition: A | Discharge status: Home / Self Care | Payer: Self-pay | Source: Home / Self Care | Attending: Podiatry

## 2022-11-26 DIAGNOSIS — M869 Osteomyelitis, unspecified: Secondary | ICD-10-CM | POA: Diagnosis not present

## 2022-11-26 DIAGNOSIS — Z7901 Long term (current) use of anticoagulants: Secondary | ICD-10-CM | POA: Diagnosis not present

## 2022-11-26 DIAGNOSIS — E1142 Type 2 diabetes mellitus with diabetic polyneuropathy: Secondary | ICD-10-CM | POA: Diagnosis not present

## 2022-11-26 DIAGNOSIS — L97422 Non-pressure chronic ulcer of left heel and midfoot with fat layer exposed: Secondary | ICD-10-CM | POA: Insufficient documentation

## 2022-11-26 DIAGNOSIS — Z951 Presence of aortocoronary bypass graft: Secondary | ICD-10-CM | POA: Insufficient documentation

## 2022-11-26 DIAGNOSIS — E1152 Type 2 diabetes mellitus with diabetic peripheral angiopathy with gangrene: Secondary | ICD-10-CM | POA: Insufficient documentation

## 2022-11-26 DIAGNOSIS — E1169 Type 2 diabetes mellitus with other specified complication: Secondary | ICD-10-CM | POA: Insufficient documentation

## 2022-11-26 DIAGNOSIS — Z955 Presence of coronary angioplasty implant and graft: Secondary | ICD-10-CM | POA: Insufficient documentation

## 2022-11-26 DIAGNOSIS — Z86711 Personal history of pulmonary embolism: Secondary | ICD-10-CM | POA: Diagnosis not present

## 2022-11-26 DIAGNOSIS — I10 Essential (primary) hypertension: Secondary | ICD-10-CM | POA: Insufficient documentation

## 2022-11-26 DIAGNOSIS — L97512 Non-pressure chronic ulcer of other part of right foot with fat layer exposed: Secondary | ICD-10-CM | POA: Diagnosis not present

## 2022-11-26 DIAGNOSIS — E11621 Type 2 diabetes mellitus with foot ulcer: Secondary | ICD-10-CM | POA: Diagnosis present

## 2022-11-26 DIAGNOSIS — Z01818 Encounter for other preprocedural examination: Secondary | ICD-10-CM

## 2022-11-26 DIAGNOSIS — Z86718 Personal history of other venous thrombosis and embolism: Secondary | ICD-10-CM | POA: Insufficient documentation

## 2022-11-26 HISTORY — PX: WOUND DEBRIDEMENT: SHX247

## 2022-11-26 HISTORY — PX: GRAFT APPLICATION: SHX6696

## 2022-11-26 HISTORY — PX: AMPUTATION TOE: SHX6595

## 2022-11-26 LAB — GLUCOSE, CAPILLARY
Glucose-Capillary: 117 mg/dL — ABNORMAL HIGH (ref 70–99)
Glucose-Capillary: 105 mg/dL — ABNORMAL HIGH (ref 70–99)

## 2022-11-26 SURGERY — AMPUTATION, TOE
Anesthesia: General | Site: Toe | Laterality: Right

## 2022-11-26 MED ORDER — CHLORHEXIDINE GLUCONATE 0.12 % MT SOLN
OROMUCOSAL | Status: AC
  Filled 2022-11-26: qty 15

## 2022-11-26 MED ORDER — ONDANSETRON HCL 4 MG/2ML IJ SOLN
INTRAMUSCULAR | Status: AC
  Filled 2022-11-26: qty 2

## 2022-11-26 MED ORDER — FENTANYL CITRATE (PF) 100 MCG/2ML IJ SOLN: 25.0000 ug | INTRAMUSCULAR | Status: DC | PRN

## 2022-11-26 MED ORDER — ORAL CARE MOUTH RINSE: 15.0000 mL | Freq: Once | OROMUCOSAL | Status: AC

## 2022-11-26 MED ORDER — CHLORHEXIDINE GLUCONATE 0.12 % MT SOLN
15.0000 mL | Freq: Once | OROMUCOSAL | Status: AC
  Administered 2022-11-26: 15 mL via OROMUCOSAL

## 2022-11-26 MED ORDER — PROPOFOL 1000 MG/100ML IV EMUL
INTRAVENOUS | Status: AC
  Filled 2022-11-26: qty 100

## 2022-11-26 MED ORDER — FAMOTIDINE 20 MG PO TABS
ORAL_TABLET | ORAL | Status: AC
  Filled 2022-11-26: qty 1

## 2022-11-26 MED ORDER — BUPIVACAINE HCL 0.5 % IJ SOLN
INTRAMUSCULAR | Status: DC | PRN
  Administered 2022-11-26: 10 mL

## 2022-11-26 MED ORDER — SODIUM CHLORIDE 0.9 % IV SOLN: INTRAVENOUS | Status: DC

## 2022-11-26 MED ORDER — MIDAZOLAM HCL 2 MG/2ML IJ SOLN
INTRAMUSCULAR | Status: AC
  Filled 2022-11-26: qty 2

## 2022-11-26 MED ORDER — BUPIVACAINE HCL (PF) 0.5 % IJ SOLN
INTRAMUSCULAR | Status: AC
  Filled 2022-11-26: qty 30

## 2022-11-26 MED ORDER — PROPOFOL 500 MG/50ML IV EMUL
INTRAVENOUS | Status: DC | PRN
  Administered 2022-11-26: 10 mg via INTRAVENOUS
  Administered 2022-11-26: 50 ug/kg/min via INTRAVENOUS
  Administered 2022-11-26: 20 mg via INTRAVENOUS
  Administered 2022-11-26: 10 mg via INTRAVENOUS

## 2022-11-26 MED ORDER — FAMOTIDINE 20 MG PO TABS
20.0000 mg | ORAL_TABLET | Freq: Once | ORAL | Status: AC
  Administered 2022-11-26: 20 mg via ORAL

## 2022-11-26 MED ORDER — CEFAZOLIN SODIUM-DEXTROSE 2-4 GM/100ML-% IV SOLN
INTRAVENOUS | Status: AC
  Filled 2022-11-26: qty 100

## 2022-11-26 MED ORDER — DOXYCYCLINE HYCLATE 100 MG PO CAPS: 100.0000 mg | ORAL_CAPSULE | Freq: Two times a day (BID) | ORAL | 0 refills | Status: DC

## 2022-11-26 MED ORDER — CEFAZOLIN SODIUM-DEXTROSE 2-4 GM/100ML-% IV SOLN
2.0000 g | INTRAVENOUS | Status: AC
  Administered 2022-11-26: 2 g via INTRAVENOUS

## 2022-11-26 MED ORDER — ACETAMINOPHEN 10 MG/ML IV SOLN
INTRAVENOUS | Status: AC
  Filled 2022-11-26: qty 100

## 2022-11-26 MED ORDER — MIDAZOLAM HCL 2 MG/2ML IJ SOLN
INTRAMUSCULAR | Status: DC | PRN
  Administered 2022-11-26: 2 mg via INTRAVENOUS

## 2022-11-26 MED ORDER — FENTANYL CITRATE (PF) 100 MCG/2ML IJ SOLN
INTRAMUSCULAR | Status: AC
  Filled 2022-11-26: qty 2

## 2022-11-26 MED ORDER — LIDOCAINE HCL (CARDIAC) PF 100 MG/5ML IV SOSY
PREFILLED_SYRINGE | INTRAVENOUS | Status: DC | PRN
  Administered 2022-11-26: 40 mg via INTRAVENOUS

## 2022-11-26 MED ORDER — 0.9 % SODIUM CHLORIDE (POUR BTL) OPTIME
TOPICAL | Status: DC | PRN
  Administered 2022-11-26: 500 mL

## 2022-11-26 MED ORDER — FENTANYL CITRATE (PF) 100 MCG/2ML IJ SOLN
INTRAMUSCULAR | Status: DC | PRN
  Administered 2022-11-26: 25 ug via INTRAVENOUS

## 2022-11-26 SURGICAL SUPPLY — 89 items
APL PRP STRL LF DISP 70% ISPRP (MISCELLANEOUS)
BLADE MED AGGRESSIVE (BLADE) ×3 IMPLANT
BLADE OSC/SAGITTAL MD 5.5X18 (BLADE) IMPLANT
BLADE OSCILLATING/SAGITTAL (BLADE)
BLADE SURG 15 STRL LF DISP TIS (BLADE) ×3 IMPLANT
BLADE SURG 15 STRL SS (BLADE) ×2
BLADE SURG MINI STRL (BLADE) IMPLANT
BLADE SW THK.38XMED LNG THN (BLADE) IMPLANT
BNDG CMPR 5X4 CHSV STRCH STRL (GAUZE/BANDAGES/DRESSINGS) ×2
BNDG CMPR 5X6 CHSV STRCH STRL (GAUZE/BANDAGES/DRESSINGS)
BNDG CMPR 75X21 PLY HI ABS (MISCELLANEOUS) ×2
BNDG COHESIVE 4X5 TAN STRL LF (GAUZE/BANDAGES/DRESSINGS) ×3 IMPLANT
BNDG COHESIVE 6X5 TAN ST LF (GAUZE/BANDAGES/DRESSINGS) ×3 IMPLANT
BNDG ELASTIC 4X5.8 VLCR STR LF (GAUZE/BANDAGES/DRESSINGS) ×3 IMPLANT
BNDG ESMARCH 4 X 12 STRL LF (GAUZE/BANDAGES/DRESSINGS) ×2
BNDG ESMARCH 4X12 STRL LF (GAUZE/BANDAGES/DRESSINGS) ×3 IMPLANT
BNDG GAUZE DERMACEA FLUFF 4 (GAUZE/BANDAGES/DRESSINGS) ×3 IMPLANT
BNDG GZE 12X3 1 PLY HI ABS (GAUZE/BANDAGES/DRESSINGS) ×4
BNDG GZE DERMACEA 4 6PLY (GAUZE/BANDAGES/DRESSINGS) ×2
BNDG STRETCH GAUZE 3IN X12FT (GAUZE/BANDAGES/DRESSINGS) ×3 IMPLANT
CANISTER WOUND CARE 500ML ATS (WOUND CARE) ×3 IMPLANT
CHLORAPREP W/TINT 26 (MISCELLANEOUS) ×3 IMPLANT
CNTNR URN SCR LID CUP LEK RST (MISCELLANEOUS) IMPLANT
CONT SPEC 4OZ STRL OR WHT (MISCELLANEOUS) ×2
CUFF TOURN SGL QUICK 12 (TOURNIQUET CUFF) IMPLANT
CUFF TOURN SGL QUICK 18X4 (TOURNIQUET CUFF) IMPLANT
DRAPE BILAT LIMB 76X120 89291 (MISCELLANEOUS) IMPLANT
DRAPE FLUOR MINI C-ARM 54X84 (DRAPES) IMPLANT
DRAPE XRAY CASSETTE 23X24 (DRAPES) IMPLANT
DRSG EMULSION OIL 3X3 NADH (GAUZE/BANDAGES/DRESSINGS) IMPLANT
DRSG VAC GRANUFOAM MED (GAUZE/BANDAGES/DRESSINGS) IMPLANT
DURAPREP 26ML APPLICATOR (WOUND CARE) ×3 IMPLANT
ELECT REM PT RETURN 9FT ADLT (ELECTROSURGICAL) ×2
ELECTRODE REM PT RTRN 9FT ADLT (ELECTROSURGICAL) ×3 IMPLANT
GAUZE PACKING 0.25INX5YD STRL (GAUZE/BANDAGES/DRESSINGS) ×3 IMPLANT
GAUZE SPONGE 4X4 12PLY STRL (GAUZE/BANDAGES/DRESSINGS) ×6 IMPLANT
GAUZE STRETCH 2X75IN STRL (MISCELLANEOUS) ×3 IMPLANT
GAUZE XEROFORM 1X8 LF (GAUZE/BANDAGES/DRESSINGS) ×3 IMPLANT
GLOVE BIOGEL PI IND STRL 7.5 (GLOVE) ×3 IMPLANT
GLOVE PI ORTHO PRO STRL SZ7 (GLOVE) ×3 IMPLANT
GLOVE SURG ORTHO 7.0 STRL STRW (GLOVE) ×3 IMPLANT
GOWN STRL REUS W/ TWL LRG LVL3 (GOWN DISPOSABLE) ×6 IMPLANT
GOWN STRL REUS W/TWL LRG LVL3 (GOWN DISPOSABLE) ×2
GOWN STRL REUS W/TWL MED LVL3 (GOWN DISPOSABLE) ×3 IMPLANT
GRAFT MYRIAD 3 LAYER 5X5 (Graft) IMPLANT
HANDPIECE VERSAJET DEBRIDEMENT (MISCELLANEOUS) IMPLANT
IV NS 1000ML (IV SOLUTION)
IV NS 1000ML BAXH (IV SOLUTION) ×3 IMPLANT
IV NS IRRIG 3000ML ARTHROMATIC (IV SOLUTION) IMPLANT
KIT DRSG VAC SLVR GRANUFM (MISCELLANEOUS) ×3 IMPLANT
KIT PREVENA INCISION MGT 13 (CANNISTER) IMPLANT
KIT TURNOVER KIT A (KITS) ×3 IMPLANT
LABEL OR SOLS (LABEL) ×3 IMPLANT
MANIFOLD NEPTUNE II (INSTRUMENTS) ×3 IMPLANT
NDL FILTER BLUNT 18X1 1/2 (NEEDLE) ×3 IMPLANT
NDL HYPO 25X1 1.5 SAFETY (NEEDLE) ×6 IMPLANT
NEEDLE FILTER BLUNT 18X1 1/2 (NEEDLE) ×2 IMPLANT
NEEDLE HYPO 25X1 1.5 SAFETY (NEEDLE) ×4 IMPLANT
NS IRRIG 500ML POUR BTL (IV SOLUTION) ×3 IMPLANT
PACK EXTREMITY ARMC (MISCELLANEOUS) ×3 IMPLANT
PACKING GAUZE IODOFORM 1INX5YD (GAUZE/BANDAGES/DRESSINGS) ×3 IMPLANT
PAD ABD DERMACEA PRESS 5X9 (GAUZE/BANDAGES/DRESSINGS) ×3 IMPLANT
PAD PREP 24X41 OB/GYN DISP (PERSONAL CARE ITEMS) ×3 IMPLANT
PULSAVAC PLUS IRRIG FAN TIP (DISPOSABLE)
RASP SM TEAR CROSS CUT (RASP) IMPLANT
SHIELD FULL FACE ANTIFOG 7M (MISCELLANEOUS) ×3 IMPLANT
SOL PREP PVP 2OZ (MISCELLANEOUS) ×2
SOLUTION PREP PVP 2OZ (MISCELLANEOUS) ×3 IMPLANT
STAPLER SKIN PROX 35W (STAPLE) IMPLANT
STOCKINETTE IMPERVIOUS 9X36 MD (GAUZE/BANDAGES/DRESSINGS) ×3 IMPLANT
STOCKINETTE STRL 6IN 960660 (GAUZE/BANDAGES/DRESSINGS) ×3 IMPLANT
SUT ETHILON 2 0 FS 18 (SUTURE) ×6 IMPLANT
SUT ETHILON 3-0 FS-10 30 BLK (SUTURE)
SUT ETHILON 4-0 (SUTURE)
SUT ETHILON 4-0 FS2 18XMFL BLK (SUTURE)
SUT MNCRL 4-0 VIOLET P-3 (SUTURE) IMPLANT
SUT MNCRL AB 3-0 PS2 27 (SUTURE) ×3 IMPLANT
SUT MONOCRYL 4-0 (SUTURE) ×2
SUT VIC AB 3-0 SH 27 (SUTURE)
SUT VIC AB 3-0 SH 27X BRD (SUTURE) ×3 IMPLANT
SUT VIC AB 4-0 FS2 27 (SUTURE) ×3 IMPLANT
SUTURE EHLN 3-0 FS-10 30 BLK (SUTURE) ×3 IMPLANT
SUTURE ETHLN 4-0 FS2 18XMF BLK (SUTURE) ×3 IMPLANT
SWAB CULTURE AMIES ANAERIB BLU (MISCELLANEOUS) IMPLANT
SYR 10ML LL (SYRINGE) ×3 IMPLANT
SYR 3ML LL SCALE MARK (SYRINGE) ×3 IMPLANT
TIP FAN IRRIG PULSAVAC PLUS (DISPOSABLE) ×3 IMPLANT
TRAP FLUID SMOKE EVACUATOR (MISCELLANEOUS) ×3 IMPLANT
WATER STERILE IRR 500ML POUR (IV SOLUTION) ×3 IMPLANT

## 2022-11-26 NOTE — Op Note (Signed)
Patient Name: [name] DOB: 11/15/1950  MRN: [id number]   Date of Service: 11/26/2022  Surgeon: Dr. Sharl Ma, DPM Assistants: None Pre-operative Diagnosis:  Osteomyelitis right fourth toe Ulceration left foot with exposed fat layer Post-operative Diagnosis:  osteomyelitis right fourth toe Ulceration left foot with exposed fat layer Procedures:   * AMPUTATION TOE fourth right   *Preparation left foot wound bed  Applications skin substitute left foot 1.0 x 1.5 cm Application negative pressure wound therapy Pathology/Specimens: ID Type Source Tests Collected by Time Destination  1 : Right 4th toe Tissue PATH Digit amputation SURGICAL PATHOLOGY Edwin Cap, DPM 11/26/2022 4098   A : Right foot wound Wound Path fluid AEROBIC/ANAEROBIC CULTURE W GRAM STAIN (SURGICAL/DEEP WOUND) Edwin Cap, DPM 11/26/2022 0910    Anesthesia: MAC with local Hemostasis: * No tourniquets in log * Estimated Blood Loss: 2 mL Materials:  Implant Name Type Inv. Item Serial No. Manufacturer Lot No. LRB No. Used Action  GRAFT MYRIAD 3 LAYER 5X5 - [id number] Graft GRAFT MYRIAD 3 LAYER 5X5  [employer] [id number] Left 1 Implanted   Medications: 10 cc 0.5% Marcaine plain Complications: No complication noted.  Indications for Procedure:  This is a 72 y.o. female with a history of uncontrolled type 2 diabetes, PAD with a right toe ulcer that developed osteomyelitis of the fourth toe, she also has a pre-existing ulceration on the left plantar foot that is responded poorly to local wound care.  Amputation of the right fourth toe was recommended for surgical cure of the osteomyelitis and debridement and graft application of the left foot wound was recommended.   Procedure in Detail: Patient was identified in pre-operative holding area. Formal consent was signed and the bilateral lower extremity was marked. Patient was brought back to the operating room. Anesthesia was induced. The  extremity was prepped and draped in the usual sterile fashion. Timeout was taken to confirm patient name, laterality, and procedure prior to incision.   Attention was then directed to the right fourth toe where an incision was made and a racquet style incision around the proximal phalanx. Dissection was carried down to level of bone.  Dissection was continued to the metatarsal phalangeal joint and all collateral ligaments were freed at the joint.  The bone soft tissue attachments of the proximal phalanx were removed and passed for pathology.  The remaining metatarsal head appeared healthy and viable.  The area was copiously irrigated.  The skin was reapproximated with Monocryl and skin staples.  The right foot was then dressed with Adaptic, and dry sterile dressings.  An Ace wrap was applied under no compression  I then directed my attention to the left foot which exhibited a submetatarsal 3 ulceration.  This was full-thickness in nature and had no evidence of infection.  There is large amount of hyperkeratosis surrounding the ulceration.  Fibrogranular wound bed and biofilm was present in the wound bed.  I began by full-thickness excisional surgical debridement with a sharp scalpel to remove all nonviable tissue, fibrosis and hyperkeratosis to prepare the wound bed.  Once the wound bed was adequately prepared it was irrigated thoroughly with saline.  The wound measured 1.0 x 1.5 cm.  A myriad matrix multilayer skin substitute was then selected and inset into the wound bed.  It was cut to fit and secured with skin staples.  An Adaptic was then cut to fit and secured over this with skin staples.  Surgical lube was applied to the graft for  hydration.  A negative pressure wound therapy VAC was then applied and therapy was started.  An Ace wrap was applied around the foot under no compression. Patient tolerated the procedure well.   Disposition: Following a period of post-operative monitoring, patient will be  transferred to home.

## 2022-11-26 NOTE — Transfer of Care (Signed)
Immediate Anesthesia Transfer of Care Note  Patient: [name]  Procedure(s) Performed: AMPUTATION TOE fourth (Right: Toe) DEBRIDEMENT WOUND (Left) GRAFT APPLICATION (Left)  Patient Location: PACU  Anesthesia Type:General  Level of Consciousness: drowsy and patient cooperative  Airway & Oxygen Therapy: Patient Spontanous Breathing and Patient connected to face mask oxygen  Post-op Assessment: Report given to RN and Patient moving all extremities X 4  Post vital signs: Reviewed and stable  Last Vitals:  Vitals Value Taken Time  BP 153/58 11/26/22 0945  Temp    Pulse 50 11/26/22 0947  Resp 12 11/26/22 0947  SpO2 100 % 11/26/22 0947  Vitals shown include unvalidated device data.  Last Pain:  Vitals:   11/26/22 0732  TempSrc: Temporal  PainSc: 0-No pain         Complications: No notable events documented.

## 2022-11-26 NOTE — Anesthesia Procedure Notes (Signed)
Procedure Name: MAC Date/Time: 11/26/2022 8:47 AM  Performed by: Lily Lovings, CRNAPre-anesthesia Checklist: Patient identified, Emergency Drugs available, Suction available, Patient being monitored and Timeout performed Patient Re-evaluated:Patient Re-evaluated prior to induction Oxygen Delivery Method: Simple face mask Preoxygenation: Pre-oxygenation with 100% oxygen Induction Type: IV induction

## 2022-11-26 NOTE — Anesthesia Preprocedure Evaluation (Addendum)
Anesthesia Evaluation  Patient identified by MRN, date of birth, ID band Patient awake    Reviewed: Allergy & Precautions, NPO status , Patient's Chart, lab work & pertinent test results  History of Anesthesia Complications Negative for: history of anesthetic complications  Airway Mallampati: III   Neck ROM: Full    Dental  (+) Dental Advidsory Given, Teeth Intact   Pulmonary neg pulmonary ROS   Pulmonary exam normal breath sounds clear to auscultation       Cardiovascular hypertension, (-) angina + CAD (s/p stents and CABG), + Cardiac Stents, + Peripheral Vascular Disease and +CHF  (-) Past MI Normal cardiovascular exam+ dysrhythmias (a fib on Plavix) (-) Valvular Problems/Murmurs Rhythm:Regular Rate:Normal  Hx PE  ECG 02/11/22: accelerated junctional rhythm with occasional PVCs   Neuro/Psych neg Seizures PSYCHIATRIC DISORDERS Anxiety Depression     Neuromuscular disease (diabetic polyneuropathy)    GI/Hepatic negative GI ROS,,,  Endo/Other  diabetes, Type 2    Renal/GU negative Renal ROS     Musculoskeletal   Abdominal   Peds  Hematology negative hematology ROS (+)   Anesthesia Other Findings Cardiology note 04/07/22:  1. Atherosclerosis of artery extremity with ulceration -s/p angiogram with PTCA 08/2017 of SFA and popliteal arteries on the right also right posterior tibial artery -continue current medication regimen   2.  Diabetes mellitus type 2 -per PCP   3. Coronary artery disease status post bypass grafting -No chest pain or SOB -Continue GDMT: ASA 81 mg, atenolol 50 mg daily, Plavix 75 mg daily, Crestor 20 mg daily, Xarelto 2.5 mg twice daily, as needed nitroglycerin, hydralazine 25 mg twice daily   4. 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  She is off this medication now due to surgery tomorrow.   5.  Essential hypertension -much better today  140/64 -Continue current medication regimen   Disposition: Follow up  3-4 months  with Julien Nordmann, MD or APP.   Reproductive/Obstetrics                             Anesthesia Physical Anesthesia Plan  ASA: 3  Anesthesia Plan: General   Post-op Pain Management:    Induction: Intravenous  PONV Risk Score and Plan: 3 and Treatment may vary due to age or medical condition, Propofol infusion and TIVA  Airway Management Planned: Natural Airway and Simple Face Mask  Additional Equipment:   Intra-op Plan:   Post-operative Plan:   Informed Consent: I have reviewed the patients History and Physical, chart, labs and discussed the procedure including the risks, benefits and alternatives for the proposed anesthesia with the patient or authorized representative who has indicated his/her understanding and acceptance.     Dental advisory given  Plan Discussed with: CRNA  Anesthesia Plan Comments:         Anesthesia Quick Evaluation

## 2022-11-26 NOTE — H&P (Signed)
History and Physical Interval Note:  11/26/2022 8:16 AM  [name]  has presented today for surgery, with the diagnosis of osteomyelitis, chronic ulceration.  The various methods of treatment have been discussed with the patient and family. After consideration of risks, benefits and other options for treatment, the patient has consented to   Procedure(s): AMPUTATION TOE fourth (Right) DEBRIDEMENT WOUND (Left) GRAFT APPLICATION (Left) as a surgical intervention.  The patient's history has been reviewed, patient examined, no change in status, stable for surgery.  I have reviewed the patient's chart and labs.  Questions were answered to the patient's satisfaction.     Edwin Cap

## 2022-11-26 NOTE — Discharge Instructions (Addendum)
Post-Surgery Instructions  1. If you are recuperating from surgery anywhere other than home, please be sure to leave [place] a number where you can be reached. 2. Go directly home and rest. 3. The keep operated foot (or feet) elevated six inches above the hip when sitting or lying down. 4. Support the elevated foot and leg with pillows under the calf. DO NOT PLACE PILLOWS UNDER THE KNEE. 5. DO NOT REMOVE or get your bandages wet. This will increase your chances of getting an infection. 6. Wear your surgical shoe at all times when you are up. 7. A limited amount of pain and swelling may occur. The skin may take on a bruised appearance. This is no cause for alarm. 8. For slight pain and swelling, apply an ice pack directly over the bandage for 15 minutes every hour. Continue icing until seen in the office. DO NOT apply any form of heat to the area. 9. Have prescription(s) filled immediately and take as directed. 10. Drink lots of liquids, water, and juice. 11. CALL THE OFFICE IMMEDIATELY IF: a. Bleeding continues b. Pain increases and/or does not respond to medication c. Bandage or cast appears too tight d. Any liquids (water, coffee, etc.) have spilled on your bandages. e. Tripping, falling, or stubbing the surgical foot f. If your temperature rises above 101 g. If you have ANY questions at all 12. Please use the crutches, knee scooter, or walker you have prescribed, rented, or purchased. If you are non-weight bearing DO NOT put weight on the operated foot for _________ days. If you are weight-bearing, follow your physician's instructions. You are expected to be: ? weight-bearing ? non-weight bearing 13. Special Instructions:  Focus weight on the heel, instead of walking onto the front of the foot. If you have trouble with the Prevena VAC (loses suction, alarm flashing too much or beeping), call [phone] for assistance.  Keep the tube of surgical lube, we will start using this next week  when we change the dressing   14. Your next appointment is: 12/03/2022 1:00 PM   If you need to reach the nurse for any reason, please call: [place]: [phone] [place]: [phone] [place]: [phone]  AMBULATORY SURGERY  DISCHARGE INSTRUCTIONS   The drugs that you were given will stay in your system until tomorrow so for the next 24 hours you should not:  Drive an automobile Make any legal decisions Drink any alcoholic beverage   You may resume regular meals tomorrow.  Today it is better to start with liquids and gradually work up to solid foods.  You may eat anything you prefer, but it is better to start with liquids, then soup and crackers, and gradually work up to solid foods.   Please notify your doctor immediately if you have any unusual bleeding, trouble breathing, redness and pain at the surgery site, drainage, fever, or pain not relieved by medication.     Additional Instructions:

## 2022-11-28 LAB — AEROBIC/ANAEROBIC CULTURE W GRAM STAIN (SURGICAL/DEEP WOUND)

## 2022-11-29 ENCOUNTER — Encounter: Payer: Self-pay | Admitting: Podiatry

## 2022-11-29 LAB — SURGICAL PATHOLOGY

## 2022-11-29 NOTE — Anesthesia Postprocedure Evaluation (Signed)
Anesthesia Post Note  Patient: [name]  Procedure(s) Performed: AMPUTATION TOE fourth (Right: Toe) DEBRIDEMENT WOUND (Left) GRAFT APPLICATION (Left)  Patient location during evaluation: PACU Anesthesia Type: General Level of consciousness: awake and alert Pain management: pain level controlled Vital Signs Assessment: post-procedure vital signs reviewed and stable Respiratory status: spontaneous breathing, nonlabored ventilation, respiratory function stable and patient connected to nasal cannula oxygen Cardiovascular status: blood pressure returned to baseline and stable Postop Assessment: no apparent nausea or vomiting Anesthetic complications: no   No notable events documented.   Last Vitals:  Vitals:   11/26/22 1015 11/26/22 1030  BP: (!) 174/65 (!) 170/64  Pulse:  (!) 52  Resp:  17  Temp: (!) 36.4 C   SpO2:  98%    Last Pain:  Vitals:   11/26/22 1030  TempSrc:   PainSc: 0-No pain                 Lenard Simmer

## 2022-11-30 ENCOUNTER — Ambulatory Visit (INDEPENDENT_AMBULATORY_CARE_PROVIDER_SITE_OTHER): Payer: Medicare Other | Admitting: Podiatry

## 2022-11-30 DIAGNOSIS — Z9889 Other specified postprocedural states: Secondary | ICD-10-CM

## 2022-11-30 LAB — AEROBIC/ANAEROBIC CULTURE W GRAM STAIN (SURGICAL/DEEP WOUND)
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
## 2022-12-01 ENCOUNTER — Encounter: Payer: Self-pay | Admitting: Podiatry

## 2022-12-01 LAB — AEROBIC/ANAEROBIC CULTURE W GRAM STAIN (SURGICAL/DEEP WOUND): Gram Stain: NONE SEEN

## 2022-12-03 ENCOUNTER — Encounter: Payer: Medicare Other | Admitting: Podiatry

## 2022-12-07 ENCOUNTER — Encounter: Payer: Medicare Other | Admitting: Podiatry

## 2022-12-08 ENCOUNTER — Ambulatory Visit (INDEPENDENT_AMBULATORY_CARE_PROVIDER_SITE_OTHER): Payer: Medicare Other | Admitting: Podiatry

## 2022-12-08 DIAGNOSIS — L97522 Non-pressure chronic ulcer of other part of left foot with fat layer exposed: Secondary | ICD-10-CM

## 2022-12-08 DIAGNOSIS — M86171 Other acute osteomyelitis, right ankle and foot: Secondary | ICD-10-CM

## 2022-12-09 ENCOUNTER — Telehealth: Payer: Self-pay | Admitting: *Deleted

## 2022-12-09 ENCOUNTER — Encounter: Payer: Self-pay | Admitting: Podiatry

## 2022-12-09 NOTE — Progress Notes (Signed)
  Subjective:  Patient ID: [name], female    DOB: May 01, 1951,  MRN: [id number]  Chief Complaint  Patient presents with   Routine Post Op    POV # 1 DOS 11/26/22 --- DEBRIDEMENT OF LEFT FOOT WOUND WITH SKIN SUBSTITUTE APPLICATION, AMPUTATION OF RIGHT 4TH TOE - Tab Rylee PT     72 y.o. female returns for post-op check.   Review of Systems: Negative except as noted in the HPI. Denies N/V/F/Ch.   Objective:  There were no vitals filed for this visit. There is no height or weight on file to calculate BMI. Constitutional Well developed. Well nourished.  Vascular Foot warm and well perfused. Capillary refill normal to all digits.  Calf is soft and supple, no posterior calf or knee pain, negative Homans' sign  Neurologic Normal speech. Oriented to person, place, and time. Epicritic sensation to light touch grossly present bilaterally.  Dermatologic Right foot amputation site healing well staples intact, some maceration.  Left foot graft is intact healing well.  Wound measurements are 1.0 x 1.5 cm  Orthopedic: Tenderness to palpation noted about the surgical site.     Assessment:   1. Ulcer of left foot with fat layer exposed (HCC)   2. Acute osteomyelitis of right ankle or foot (HCC)    Plan:  Patient was evaluated and treated and all questions answered.  S/p foot surgery bilaterally -Progressing as expected post-operatively. -WB Status: WBAT in postop shoe -Sutures: Return 2 weeks for staple removal. -Foot redressed.  Change daily with Betadine application and nonstick gauze and gauze to right foot surgical site, left foot change daily with surgical lube, nonstick gauze, gauze and wrap. -Wound care order supplies sent to [facility]  Return in about 2 weeks (around 12/22/2022).

## 2022-12-09 NOTE — Telephone Encounter (Signed)
Revonda Standard w/ [facility] is calling to let the physician know that patient is currently seeing home health and are unable to fill the order,would like permission to forward to home health, please advise.

## 2022-12-10 NOTE — Telephone Encounter (Signed)
Called and left voice message of physician's message, thru voice messagew/ Revonda Standard ([facility])

## 2022-12-13 ENCOUNTER — Telehealth: Payer: Self-pay | Admitting: Cardiovascular Disease

## 2022-12-13 NOTE — Telephone Encounter (Signed)
Spoke with patient and she reviewed concerns about EKG. She requested that Dr. Mariah Milling review her EKG from 11/22/22 to see if she should be concerned. Advised that I would forward her request to him for review.   She did report having intermit chest pain with indigestion. Instructed her to go to ED for evaluation. Also offered DOD slot for tomorrow and if she is unable to keep that appointment then give [place] a call and we can reschedule. She verbalized understanding of our conversation with no further questions.

## 2022-12-13 NOTE — Telephone Encounter (Signed)
Patient called stating she would like to Dr. Mariah Milling to take a look at an EKG that she had done on 11/22/22.  She said there were some abnormalities on the EKG that she is concerned about.

## 2022-12-14 ENCOUNTER — Ambulatory Visit: Payer: Medicare Other | Attending: Cardiovascular Disease | Admitting: Cardiovascular Disease

## 2022-12-14 ENCOUNTER — Encounter: Payer: Self-pay | Admitting: Cardiovascular Disease

## 2022-12-14 VITALS — BP 180/70 | HR 60 | Ht 68.0 in | Wt 164.0 lb

## 2022-12-14 DIAGNOSIS — L97909 Non-pressure chronic ulcer of unspecified part of unspecified lower leg with unspecified severity: Secondary | ICD-10-CM | POA: Insufficient documentation

## 2022-12-14 DIAGNOSIS — I70291 Other atherosclerosis of native arteries of extremities, right leg: Secondary | ICD-10-CM | POA: Diagnosis not present

## 2022-12-14 DIAGNOSIS — I10 Essential (primary) hypertension: Secondary | ICD-10-CM | POA: Insufficient documentation

## 2022-12-14 DIAGNOSIS — I480 Paroxysmal atrial fibrillation: Secondary | ICD-10-CM | POA: Diagnosis not present

## 2022-12-14 DIAGNOSIS — I70299 Other atherosclerosis of native arteries of extremities, unspecified extremity: Secondary | ICD-10-CM | POA: Diagnosis not present

## 2022-12-14 DIAGNOSIS — I25708 Atherosclerosis of coronary artery bypass graft(s), unspecified, with other forms of angina pectoris: Secondary | ICD-10-CM | POA: Insufficient documentation

## 2022-12-14 DIAGNOSIS — E1159 Type 2 diabetes mellitus with other circulatory complications: Secondary | ICD-10-CM | POA: Diagnosis not present

## 2022-12-14 DIAGNOSIS — R072 Precordial pain: Secondary | ICD-10-CM | POA: Insufficient documentation

## 2022-12-14 DIAGNOSIS — Z7985 Long-term (current) use of injectable non-insulin antidiabetic drugs: Secondary | ICD-10-CM

## 2022-12-14 DIAGNOSIS — E785 Hyperlipidemia, unspecified: Secondary | ICD-10-CM | POA: Diagnosis present

## 2022-12-14 DIAGNOSIS — I739 Peripheral vascular disease, unspecified: Secondary | ICD-10-CM | POA: Diagnosis present

## 2022-12-14 MED ORDER — CLONIDINE HCL 0.1 MG PO TABS: 0.1000 mg | ORAL_TABLET | Freq: Two times a day (BID) | ORAL | 11 refills | Status: DC

## 2022-12-14 NOTE — Telephone Encounter (Signed)
Left a message for the patient to call back.     Antonieta Iba, MD  [facility] [phone] minutes ago (9:05 AM)    EKG done recently looks the same as prior EKG July 2023 Thx TG
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
## 2022-12-14 NOTE — Patient Instructions (Addendum)
Medication Instructions:  Please start clonidine 0.1 mg twice a day  If you need a refill on your cardiac medications before your next appointment, please call your pharmacy.   Lab work: No new labs needed  Testing/Procedures: Scientist, physiological for CAD, hx of CABG , angina  Your provider has ordered a Lexiscan/ Exercise Myoview Stress test. This will take place at [facility]. Please report to the [facility] medical mall entrance. The volunteers at the first desk will direct you where to go.  [facility] MYOVIEW  Your provider has ordered a Stress Test with nuclear imaging. The purpose of this test is to evaluate the blood supply to your heart muscle. This procedure is referred to as a "Non-Invasive Stress Test." This is because other than having an IV started in your vein, nothing is inserted or "invades" your body. Cardiac stress tests are done to find areas of poor blood flow to the heart by determining the extent of coronary artery disease (CAD). Some patients exercise on a treadmill, which naturally increases the blood flow to your heart, while others who are unable to walk on a treadmill due to physical limitations will have a pharmacologic/chemical stress agent called Lexiscan . This medicine will mimic walking on a treadmill by temporarily increasing your coronary blood flow.   Please note: these test may take anywhere between 2-4 hours to complete  How to prepare for your Myoview test:  Nothing to eat for 6 hours prior to the test No caffeine for 24 hours prior to test No smoking 24 hours prior to test. Your medication may be taken with water.  If your doctor stopped a medication because of this test, do not take that medication. Ladies, please do not wear dresses.  Skirts or pants are appropriate. Please wear a short sleeve shirt. No perfume, cologne or lotion. Wear comfortable walking shoes. No heels!   PLEASE NOTIFY THE OFFICE AT LEAST 24 HOURS IN ADVANCE IF YOU ARE UNABLE TO KEEP YOUR  APPOINTMENT.  [phone] AND  PLEASE NOTIFY NUCLEAR MEDICINE AT [facility] AT LEAST 24 HOURS IN ADVANCE IF YOU ARE UNABLE TO KEEP YOUR APPOINTMENT. [phone]   Follow-Up: At [facility], you and your health needs are our priority.  As part of our continuing mission to provide you with exceptional heart care, we have created designated Provider Care Teams.  These Care Teams include your primary Cardiologist (physician) and Advanced Practice Providers (APPs -  Physician Assistants and Nurse Practitioners) who all work together to provide you with the care you need, when you need it.  You will need a follow up appointment in 6 months  Providers on your designated Care Team:   Nicolasa Ducking, NP Eula Listen, PA-C Cadence Fransico Michael, [place]  COVID-19 Vaccine Information can be found at: [email or web address] For questions related to vaccine distribution or appointments, please email [email or web address] or call [phone].

## 2022-12-14 NOTE — Telephone Encounter (Signed)
Patient has appointment today with Dr. Mariah Milling.

## 2022-12-15 ENCOUNTER — Encounter: Payer: Medicare Other | Admitting: Podiatry

## 2022-12-22 ENCOUNTER — Ambulatory Visit (INDEPENDENT_AMBULATORY_CARE_PROVIDER_SITE_OTHER): Payer: Medicare Other | Admitting: Podiatry

## 2022-12-22 DIAGNOSIS — L97522 Non-pressure chronic ulcer of other part of left foot with fat layer exposed: Secondary | ICD-10-CM | POA: Diagnosis not present

## 2022-12-22 DIAGNOSIS — M86171 Other acute osteomyelitis, right ankle and foot: Secondary | ICD-10-CM

## 2022-12-23 NOTE — Progress Notes (Signed)
  Subjective:  Patient ID: [name], female    DOB: 03-22-1951,  MRN: [id number]  Chief Complaint  Patient presents with   Routine Post Op    POV # 2 DOS 11/26/22 --- DEBRIDEMENT OF LEFT FOOT WOUND WITH SKIN SUBSTITUTE APPLICATION, AMPUTATION OF RIGHT 4TH TOE     72 y.o. female returns for post-op check.  She feels like it is doing well  Review of Systems: Negative except as noted in the HPI. Denies N/V/F/Ch.   Objective:  There were no vitals filed for this visit. There is no height or weight on file to calculate BMI. Constitutional Well developed. Well nourished.  Vascular Foot warm and well perfused. Capillary refill normal to all digits.  Calf is soft and supple, no posterior calf or knee pain, negative Homans' sign  Neurologic Normal speech. Oriented to person, place, and time. Epicritic sensation to light touch grossly present bilaterally.  Dermatologic Right foot amputation site is well-healed not hypertrophic, no signs of infection or delayed wound healing.  Left foot graft shows fairly decent integration, staples intact  Orthopedic: Tenderness to palpation noted about the surgical site.     Assessment:   1. Ulcer of left foot with fat layer exposed (HCC)   2. Acute osteomyelitis of right ankle or foot (HCC)    Plan:  Patient was evaluated and treated and all questions answered.  S/p foot surgery bilaterally -Overall doing well Staples removed from graft site and amputation site.  She may begin bathing regularly and leave open to air at the amputation site on the right foot. -Left foot continue daily dressings with surgical lube and gauze dressings.  Staples removed today.  Shows decent graft integration.  Will plan to resume debridement and application of collagen matrix dressings at next visit.  Return in about 3 weeks (around 01/12/2023) for wound care.

## 2023-01-05 ENCOUNTER — Encounter: Payer: Medicare Other | Admitting: Podiatry

## 2023-01-12 ENCOUNTER — Ambulatory Visit (INDEPENDENT_AMBULATORY_CARE_PROVIDER_SITE_OTHER): Payer: Medicare Other | Admitting: Podiatry

## 2023-01-12 DIAGNOSIS — Z91199 Patient's noncompliance with other medical treatment and regimen due to unspecified reason: Secondary | ICD-10-CM

## 2023-01-14 NOTE — Progress Notes (Signed)
Patient was no-show for appointment today 

## 2023-01-25 ENCOUNTER — Encounter: Payer: Self-pay | Admitting: Podiatry

## 2023-01-25 ENCOUNTER — Ambulatory Visit (INDEPENDENT_AMBULATORY_CARE_PROVIDER_SITE_OTHER): Payer: Medicare Other | Admitting: Podiatry

## 2023-01-25 DIAGNOSIS — L97522 Non-pressure chronic ulcer of other part of left foot with fat layer exposed: Secondary | ICD-10-CM

## 2023-01-25 DIAGNOSIS — E0843 Diabetes mellitus due to underlying condition with diabetic autonomic (poly)neuropathy: Secondary | ICD-10-CM | POA: Diagnosis not present
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
## 2023-01-27 ENCOUNTER — Telehealth: Payer: Self-pay | Admitting: Cardiovascular Disease

## 2023-01-27 NOTE — Telephone Encounter (Signed)
Called and spoke with patient about scheduling her stress test. She said that she has a lot going on and currently and won't be able to make an appointment at this time. Pt said that she will call us when she is ready to schedule her appt for her stress test. Pt verbally understood that I will reach out in about two weeks if she hasn't called to schedule appt.

## 2023-02-15 ENCOUNTER — Ambulatory Visit (INDEPENDENT_AMBULATORY_CARE_PROVIDER_SITE_OTHER): Payer: Medicare Other | Admitting: Podiatry

## 2023-02-15 DIAGNOSIS — L97522 Non-pressure chronic ulcer of other part of left foot with fat layer exposed: Secondary | ICD-10-CM | POA: Diagnosis not present

## 2023-02-15 DIAGNOSIS — E0843 Diabetes mellitus due to underlying condition with diabetic autonomic (poly)neuropathy: Secondary | ICD-10-CM
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
## 2023-03-09 ENCOUNTER — Encounter: Payer: Self-pay | Admitting: Podiatry

## 2023-03-09 ENCOUNTER — Ambulatory Visit: Payer: Medicare Other | Admitting: Podiatry

## 2023-03-09 DIAGNOSIS — L97522 Non-pressure chronic ulcer of other part of left foot with fat layer exposed: Secondary | ICD-10-CM

## 2023-03-09 NOTE — Progress Notes (Signed)
  Subjective:  Patient ID: [name], female    DOB: 1951-06-23,  MRN: [id number]  Chief Complaint  Patient presents with   Wound Check    "We had to move the past two weeks.  I was up on it more.  So, it's not well."     72 y.o. female returns for post-op check.    Review of Systems: Negative except as noted in the HPI. Denies N/V/F/Ch.   Objective:  There were no vitals filed for this visit. There is no height or weight on file to calculate BMI. Constitutional Well developed. Well nourished.  Vascular Foot warm and well perfused. Capillary refill normal to all digits.  Calf is soft and supple, no posterior calf or knee pain, negative Homans' sign  Neurologic Normal speech. Oriented to person, place, and time. Epicritic sensation to light touch grossly present bilaterally.  Dermatologic Full-thickness submetatarsal 3 ulceration left foot measures 1.2 x 1.0 x 0.3 cm with surrounding hyperkeratosis  Orthopedic: Tenderness to palpation noted about the surgical site.     Assessment:   1. Ulcer of left foot with fat layer exposed (HCC)    Plan:  Patient was evaluated and treated and all questions answered.  S/p foot surgery bilaterally -Overall doing well considering the amount of activity she has been on the foot.  She will continue local wound care.  The wound is debrided of nonviable tissue hyperkeratosis and slough today to the subcutaneous layer.  Post rate measurements are noted above.  Continue utilizing surgical shoe.  Will begin using collagen micronized powder, she will apply this daily with a bandage.  Return in 3 weeks for follow-up for ongoing wound care  Return in about 3 weeks (around 03/30/2023) for wound care.

## 2023-03-21 ENCOUNTER — Ambulatory Visit: Payer: Medicare Other | Admitting: Cardiovascular Disease

## 2023-03-30 ENCOUNTER — Encounter: Payer: Self-pay | Admitting: Podiatry

## 2023-03-30 ENCOUNTER — Ambulatory Visit (INDEPENDENT_AMBULATORY_CARE_PROVIDER_SITE_OTHER): Payer: Medicare Other | Admitting: Podiatry

## 2023-03-30 DIAGNOSIS — E0843 Diabetes mellitus due to underlying condition with diabetic autonomic (poly)neuropathy: Secondary | ICD-10-CM

## 2023-03-30 DIAGNOSIS — L97522 Non-pressure chronic ulcer of other part of left foot with fat layer exposed: Secondary | ICD-10-CM | POA: Diagnosis not present

## 2023-03-30 DIAGNOSIS — I739 Peripheral vascular disease, unspecified: Secondary | ICD-10-CM | POA: Diagnosis not present

## 2023-04-03 NOTE — Progress Notes (Signed)
  Subjective:  Patient ID: [name], female    DOB: May 16, 1951,  MRN: [id number]  Chief Complaint  Patient presents with   Wound Check    "It's good."     72 y.o. female returns for post-op check.    Review of Systems: Negative except as noted in the HPI. Denies N/V/F/Ch.   Objective:  There were no vitals filed for this visit. There is no height or weight on file to calculate BMI. Constitutional Well developed. Well nourished.  Vascular Foot warm and well perfused. Capillary refill normal to all digits.  Calf is soft and supple, no posterior calf or knee pain, negative Homans' sign  Neurologic Normal speech. Oriented to person, place, and time. Epicritic sensation to light touch grossly present bilaterally.  Dermatologic Full-thickness submetatarsal 3 ulceration left foot measures 1.0 x 0.8 x 0.3 cm with surrounding hyperkeratosis  Orthopedic: Tenderness to palpation noted about the surgical site.     Assessment:   1. Ulcer of left foot with fat layer exposed (HCC)   2. 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 PAD (peripheral artery disease) (HCC)     Plan:         Patient was evaluated and treated and all questions answered.   S/p foot surgery left -Progressing as expected post-operatively. -1. This patient has now suffered with the ulceration on the left foot for nearly 1 year   2. The ulceration is now chronic and has failed to respond to the standard wound care treatment methods tried thus far consisting of non weightbearing and a heel offloading wedge shoe. The patient's smoking history is none.   Following today's visit I am recommending evaluation for coverage for an advanced wound healing matrix and skin substitute, as an effective wound barrier to resist microbial colonization and support healing. This will allow [place] to promote additional granulation tissue  and inhibit bioburden and biofilm reformation and provide a collagen base to support this patient's wound healing. Reviewed risks and complications of procedure with patient and he/she has agreed.   3. Exact location of ulcer: left forefoot   4. Patient has the following risk factors and underlying disease processes that have contributed to wound formation and delayed healing: diabetes mellitus and peripheral vascular disease. The following are also part of the patient's treatment team actively involved in trying to address these factors as part of a comprehensive multi disciplinary approach: vascular surgery, endocrinology, and primary care. 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 Her osteomyelitis has been adequately treated with antibiotic therapy.     5. Diagnosis: Ulcer of left foot with fat layer exposed (HCC) [L97.522]    Return in about 3 weeks (around 04/20/2023) for wound care, Graft application to wound.

## 2023-04-20 ENCOUNTER — Encounter: Payer: Self-pay | Admitting: Podiatry

## 2023-04-20 ENCOUNTER — Ambulatory Visit (INDEPENDENT_AMBULATORY_CARE_PROVIDER_SITE_OTHER): Payer: Medicare Other | Admitting: Podiatry

## 2023-04-20 DIAGNOSIS — L97522 Non-pressure chronic ulcer of other part of left foot with fat layer exposed: Secondary | ICD-10-CM

## 2023-04-20 DIAGNOSIS — E0843 Diabetes mellitus due to underlying condition with diabetic autonomic (poly)neuropathy: Secondary | ICD-10-CM

## 2023-04-20 DIAGNOSIS — I739 Peripheral vascular disease, unspecified: Secondary | ICD-10-CM

## 2023-04-24 NOTE — Progress Notes (Signed)
Subjective:  Patient ID: [name], female    DOB: 05/26/1951,  MRN: [id number]  Chief Complaint  Patient presents with   Wound Check    "It's doing good, no pain, nothing.  [facility] needs a new prescription so I can continue to get the gauze."     72 y.o. female returns for post-op check.    Review of Systems: Negative except as noted in the HPI. Denies N/V/F/Ch.   Objective:  There were no vitals filed for this visit. There is no height or weight on file to calculate BMI. Constitutional Well developed. Well nourished.  Vascular Foot warm and well perfused. Capillary refill normal to all digits.  Calf is soft and supple, no posterior calf or knee pain, negative Homans' sign  Neurologic Normal speech. Oriented to person, place, and time. Epicritic sensation to light touch grossly present bilaterally.  Dermatologic Full-thickness submetatarsal 3 ulceration left foot measures 1.2 x 0.9 x 0.3 cm with surrounding hyperkeratosis  Orthopedic: She has no tenderness to palpation noted about the surgical site.      Assessment:   1. Ulcer of left foot with fat layer exposed (HCC)   2. PAD (peripheral artery disease) (HCC)   3. 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   S/p foot surgery left -Progressing as expected post-operatively. -1. This patient has now suffered with the ulceration on the left foot for nearly 1 year   2. The ulceration is now chronic and has failed to respond to the standard wound care treatment methods tried thus far consisting of non weightbearing and a heel offloading wedge shoe. The patient's smoking history is none.   Following today's visit I am recommending evaluation for coverage for an advanced wound healing matrix and skin substitute, as an effective wound barrier to  resist microbial colonization and support healing. This will allow [place] to promote additional granulation tissue and inhibit bioburden and biofilm reformation and provide a collagen base to support this patient's wound healing. Reviewed risks and complications of procedure with patient and he/she has agreed.   3. Exact location of ulcer: left forefoot   4. Patient has the following risk factors and underlying disease processes that have contributed to wound formation and delayed healing: diabetes mellitus and peripheral vascular disease. The following are also part of the patient's treatment team actively involved in trying to address these factors as part of a comprehensive multi disciplinary approach: vascular surgery, endocrinology, and primary care. 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 Her osteomyelitis has been adequately treated with antibiotic therapy.     5. Diagnosis: Ulcer of left foot with fat layer exposed (HCC) [L97.522]    We will plan for applying the graft next week.  She has been approved for this and a preapproval process, graft has been ordered to size.  I will see her back at that point to apply the first application.  Today the wound is debrided and an excisional manner with a sharp scalpel and all nonviable tissue subcutaneous tissue and hyperkeratosis removed to the subcutaneous layer.  It was dressed with Iodosorb and dry sterile dressings after hemostasis was achieved.  Return in about 1 week (around 04/27/2023) for Graft application to wound.

## 2023-04-26 ENCOUNTER — Telehealth: Payer: Self-pay | Admitting: Podiatry

## 2023-04-26 NOTE — Telephone Encounter (Signed)
Patient needs order for supplies today.  Patient states was going to be put in last week.

## 2023-04-27 ENCOUNTER — Ambulatory Visit (INDEPENDENT_AMBULATORY_CARE_PROVIDER_SITE_OTHER): Payer: Medicare Other | Admitting: Podiatry

## 2023-04-27 ENCOUNTER — Telehealth: Payer: Self-pay | Admitting: Podiatry

## 2023-04-27 ENCOUNTER — Encounter: Payer: Self-pay | Admitting: Podiatry

## 2023-04-27 VITALS — BP 186/73 | HR 65

## 2023-04-27 DIAGNOSIS — E11621 Type 2 diabetes mellitus with foot ulcer: Secondary | ICD-10-CM

## 2023-04-27 DIAGNOSIS — L97522 Non-pressure chronic ulcer of other part of left foot with fat layer exposed: Secondary | ICD-10-CM

## 2023-04-27 NOTE — Telephone Encounter (Signed)
Pt called regarding medical supplies ordered from [facility].   Fax number provided [phone] Email is [email or web address]

## 2023-04-27 NOTE — Progress Notes (Signed)
Subjective:  Patient ID: [name], female    DOB: 12-Dec-1950,  MRN: [id number]  Chief Complaint  Patient presents with   Wound Check    "It's doing good."     72 y.o. female returns for ongoing wound care  Review of Systems: Negative except as noted in the HPI. Denies N/V/F/Ch.   Objective:   Vitals:   04/27/23 1455  BP: (!) 186/73  Pulse: 65   There is no height or weight on file to calculate BMI. Constitutional Well developed. Well nourished.  Vascular Foot warm and well perfused. Capillary refill normal to all digits.  Calf is soft and supple, no posterior calf or knee pain, negative Homans' sign  Neurologic Normal speech. Oriented to person, place, and time. Epicritic sensation to light touch grossly present bilaterally.  Dermatologic Full-thickness submetatarsal 3 ulceration left foot measures 0.9 x 1.1 x 0.3 cm with surrounding hyperkeratosis  Orthopedic: She has no tenderness to palpation noted about the surgical site.      Assessment:   1. 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 mellitus, with fat layer exposed, unspecified part of foot (HCC)     Plan:   1. Today patient presents for initial of InnovaMatrix AC 2. Ulceration noted at plantar left forefoot.  There is not peri wound erythema.  Odor absent.  Drainage is minimal. Wound bed shows good granulation.  Granulation tissue present in 100% of the ulceration.  Undermining and tunneling is absent. Post debridement the ulcer at the left forefoot submetatarsal 3 measures 1.1 cm x 0.9 cm x 0.3 cm).  Necrotic tissue was removed and debridement to the level of skin, subcutaneous tissuewas completed. 3. Wound characteristics from last visit show 0% increase in granulation tissue, size reduction of 0%. 4. Excisional debridement performed through subcutaneous tissue with sharp scalpel to remove all necrotic tissue and biofilm.  Fatty layer was exposed.  Bleeding was controlled with direct  pressure.  InnovaMatrix AC was applied and affixed with Mepitel non adherent wound dressing. Secondary dressings were applied. 5. Continue surgical shoe.  Reevaluation will take place in 2 weeks 6.  2.25 cm2 of product was utilized and 0 cm2 amount of product was wasted during application  7.  15 mm disc used with lot #[id number]  No follow-ups on file.

## 2023-04-29 ENCOUNTER — Telehealth: Payer: Self-pay | Admitting: Podiatry

## 2023-04-29 NOTE — Telephone Encounter (Signed)
Sami Bonds called from [employer]. She said they haven't received the order from Dr. Lilian Kapur for the patients medical supplies and the patient needs them ASAP. Their fax number is [phone]. Sami's email is [email or web address] A good phone number is [phone].

## 2023-04-29 NOTE — Telephone Encounter (Signed)
Patient LM on after hours voicemail stating she saw Dr Lilian Kapur this week and an order was suppose to be sent to [facility] for her to get gauze and stuff to take care of her wound. Pt states she called [facility] and there was no order sent over.

## 2023-04-29 NOTE — Telephone Encounter (Signed)
Dr Lilian Kapur sent over yesterday

## 2023-05-11 ENCOUNTER — Encounter: Payer: Self-pay | Admitting: Podiatry

## 2023-05-11 ENCOUNTER — Ambulatory Visit: Payer: Medicare Other | Admitting: Podiatry

## 2023-05-11 VITALS — BP 192/76 | HR 80

## 2023-05-11 DIAGNOSIS — E11621 Type 2 diabetes mellitus with foot ulcer: Secondary | ICD-10-CM

## 2023-05-11 DIAGNOSIS — L97522 Non-pressure chronic ulcer of other part of left foot with fat layer exposed: Secondary | ICD-10-CM

## 2023-05-11 NOTE — Progress Notes (Addendum)
Subjective:  Patient ID: [name], female    DOB: 05-23-1951,  MRN: [id number]  Chief Complaint  Patient presents with   Wound Check    "My husband said it looks like it's gotten smaller.  It doesn't hurt."     72 y.o. female returns for ongoing wound care  Review of Systems: Negative except as noted in the HPI. Denies N/V/F/Ch.   Objective:   Vitals:   05/11/23 1508 05/11/23 1511  BP: (!) 200/85 (!) 192/76  Pulse: 81 80   There is no height or weight on file to calculate BMI. Constitutional Well developed. Well nourished.  Vascular Foot warm and well perfused. Capillary refill normal to all digits.  Calf is soft and supple, no posterior calf or knee pain, negative Homans' sign  Neurologic Normal speech. Oriented to person, place, and time. Epicritic sensation to light touch grossly present bilaterally.  Dermatologic Full-thickness submetatarsal 3 ulceration left foot measures 0.8 x 1.0 x 0.2 cm with surrounding hyperkeratosis  Orthopedic: She has no tenderness to palpation noted about the surgical site.      Assessment:   1. 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 mellitus, with fat layer exposed, unspecified part of foot (HCC)     Plan:   1. Today patient presents for initial of InnovaMatrix AC 2. Ulceration noted at plantar left forefoot.  There is not peri wound erythema.  Odor absent.  Drainage is minimal. Wound bed shows good granulation.  Granulation tissue present in 100% of the ulceration.  Undermining and tunneling is absent. Post debridement the ulcer at the left forefoot submetatarsal 3 measures 1.0 cm x 0.0 cm x 0.2 cm).  Necrotic tissue was removed and debridement to the level of skin, subcutaneous tissuewas completed. 3. Wound characteristics from last visit show 0% increase in granulation tissue, size reduction of 10%. 4. Excisional debridement performed through subcutaneous tissue with sharp scalpel to remove all necrotic  tissue and biofilm.  Fatty layer was exposed.  Bleeding was controlled with direct pressure.  InnovaMatrix AC was applied and affixed with Mepitel non adherent wound dressing. Secondary dressings were applied. 5. Continue surgical shoe.  Reevaluation will take place in 2 weeks 6.  2.25 cm2 of product was utilized and 0 cm2 amount of product was wasted during application  7.  15 mm disc used with lot #[id number]  Return in about 2 weeks (around 05/25/2023) for wound care, Graft application to wound.

## 2023-05-18 ENCOUNTER — Ambulatory Visit: Payer: Medicare Other | Admitting: Podiatry

## 2023-05-23 ENCOUNTER — Telehealth: Payer: Self-pay | Admitting: Podiatry

## 2023-05-23 NOTE — Telephone Encounter (Signed)
Called pt to give her information from Dr Lilian Kapur and her mailbox is full. Going to try to [employer].

## 2023-05-23 NOTE — Telephone Encounter (Signed)
Pt called asking if she could wait to come in until 10/23 appt as her husband has a cardiologist and another appt the same day as her appt with you on 10/16. If she should not wait could she see another provider but as of now we have not providers to see pt until next week.

## 2023-05-25 ENCOUNTER — Ambulatory Visit (INDEPENDENT_AMBULATORY_CARE_PROVIDER_SITE_OTHER): Payer: Medicare Other | Admitting: Podiatry

## 2023-05-25 ENCOUNTER — Ambulatory Visit: Payer: Medicare Other | Admitting: Podiatry

## 2023-05-25 DIAGNOSIS — L97522 Non-pressure chronic ulcer of other part of left foot with fat layer exposed: Secondary | ICD-10-CM | POA: Diagnosis not present

## 2023-05-25 DIAGNOSIS — E11621 Type 2 diabetes mellitus with foot ulcer: Secondary | ICD-10-CM

## 2023-05-25 NOTE — Progress Notes (Signed)
Subjective:  Patient ID: [name], female    DOB: 10-12-1950,  MRN: [id number]  Chief Complaint  Patient presents with   Wound Check    Left foot wound care, patient has been changing dressing every day as directed, says she has been on it a lot because of ongoing legal case with family member     72 y.o. female returns for ongoing wound care  Review of Systems: Negative except as noted in the HPI. Denies N/V/F/Ch.   Objective:   There were no vitals filed for this visit.  There is no height or weight on file to calculate BMI. Constitutional Well developed. Well nourished.  Vascular Foot warm and well perfused. Capillary refill normal to all digits.  Calf is soft and supple, no posterior calf or knee pain, negative Homans' sign  Neurologic Normal speech. Oriented to person, place, and time. Epicritic sensation to light touch grossly present bilaterally.  Dermatologic Full-thickness submetatarsal 3 ulceration left foot measures 0.8 x 1.0 x 0.2 cm with surrounding hyperkeratosis.  Relatively unchanged dimensions size, surrounding hyperkeratosis has increased, granular wound bed remains.  There are no signs of infection.  Orthopedic: She has no tenderness to palpation noted about the surgical site.      Assessment:   1. 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 mellitus, with fat layer exposed, unspecified part of foot (HCC)      Plan:   1. Today patient presents for initial of InnovaMatrix AC 2. Ulceration noted at plantar left forefoot.  There is not peri wound erythema.  Odor absent.  Drainage is minimal. Wound bed shows good granulation.  Granulation tissue present in 100% of the ulceration.  Undermining and tunneling is absent. Post debridement the ulcer at the left forefoot submetatarsal 3 measures 1.0 cm x 0.0 cm x 0.2 cm).  Necrotic tissue and surrounding nonviable hyperkeratotic tissue was removed and debridement to the level of skin,  subcutaneous tissue was completed. 3. Wound characteristics from last visit show 0% increase in granulation tissue, size reduction of 0%. 4. Excisional debridement performed through subcutaneous tissue with sharp scalpel to remove all necrotic tissue and biofilm.  Fatty layer was exposed.  Bleeding was controlled with direct pressure.  InnovaMatrix AC was applied and affixed with Mepitel non adherent wound dressing. Secondary dressings were applied. 5. Continue surgical shoe.  Reevaluation will take place in 2 weeks 6.  2.25 cm2 of product was utilized and 0 cm2 amount of product was wasted during application  7.  15 mm disc used with lot #[id number]  No follow-ups on file.

## 2023-06-01 ENCOUNTER — Encounter: Payer: Self-pay | Admitting: Podiatry

## 2023-06-01 ENCOUNTER — Ambulatory Visit: Payer: Medicare Other | Admitting: Podiatry

## 2023-06-01 VITALS — BP 187/79 | HR 64

## 2023-06-01 DIAGNOSIS — L97522 Non-pressure chronic ulcer of other part of left foot with fat layer exposed: Secondary | ICD-10-CM | POA: Diagnosis not present

## 2023-06-01 DIAGNOSIS — E11621 Type 2 diabetes mellitus with foot ulcer: Secondary | ICD-10-CM

## 2023-06-02 NOTE — Progress Notes (Signed)
Subjective:  Patient ID: [name], female    DOB: [birth year],  MRN: [id number]  Chief Complaint  Patient presents with   Wound Check    "It doesn't hurt or anything."     72 y.o. female returns for ongoing wound care  Review of Systems: Negative except as noted in the HPI. Denies N/V/F/Ch.   Objective:   Vitals:   06/01/23 1642 06/01/23 1643  BP: (!) 191/76 (!) 187/79  Pulse: 64 64    There is no height or weight on file to calculate BMI. Constitutional Well developed. Well nourished.  Vascular Foot warm and well perfused. Capillary refill normal to all digits.  Calf is soft and supple, no posterior calf or knee pain, negative Homans' sign  Neurologic Normal speech. Oriented to person, place, and time. Epicritic sensation to light touch grossly present bilaterally.  Dermatologic Full-thickness submetatarsal 3 ulceration left foot measures 0.9 x 0.7 x 0.2 cm with surrounding hyperkeratosis.  Relatively unchanged dimensions size, surrounding hyperkeratosis has increased, granular wound bed remains.  There are no signs of infection.  Orthopedic: She has no tenderness to palpation noted about the surgical site.      Assessment:   1. 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 mellitus, with fat layer exposed, unspecified part of foot (HCC)      Plan:   1. Today patient presents for initial of InnovaMatrix AC 2. Ulceration noted at plantar left forefoot.  There is not peri wound erythema.  Odor absent.  Drainage is minimal. Wound bed shows good granulation.  Granulation tissue present in 100% of the ulceration.  Undermining and tunneling is absent. Post debridement the ulcer at the left forefoot submetatarsal 3 measures 0.9 x 0.7 cm x 0.2 cm).  Necrotic tissue and surrounding nonviable hyperkeratotic tissue was removed and debridement to the level of skin, subcutaneous tissue was completed. 3. Wound characteristics from last visit show 0% increase in  granulation tissue, size reduction of 20 %. 4. Excisional debridement performed through subcutaneous tissue with sharp scalpel to remove all necrotic tissue and biofilm.  Fatty layer was exposed.  Bleeding was controlled with direct pressure.  InnovaMatrix AC was applied and affixed with Mepitel non adherent wound dressing. Secondary dressings were applied. 5. Continue surgical shoe.  Reevaluation will take place in 1 week 6.  2.25 cm2 of product was utilized and 0 cm2 amount of product was wasted during application  7.  15 mm disc used with lot #[id number]  No follow-ups on file.

## 2023-06-08 ENCOUNTER — Ambulatory Visit (INDEPENDENT_AMBULATORY_CARE_PROVIDER_SITE_OTHER): Payer: Medicare Other | Admitting: Podiatry

## 2023-06-08 ENCOUNTER — Encounter: Payer: Self-pay | Admitting: Podiatry

## 2023-06-08 VITALS — Ht 68.0 in | Wt 164.0 lb

## 2023-06-08 DIAGNOSIS — E11621 Type 2 diabetes mellitus with foot ulcer: Secondary | ICD-10-CM

## 2023-06-08 DIAGNOSIS — L97522 Non-pressure chronic ulcer of other part of left foot with fat layer exposed: Secondary | ICD-10-CM | POA: Diagnosis not present

## 2023-06-09 ENCOUNTER — Other Ambulatory Visit (INDEPENDENT_AMBULATORY_CARE_PROVIDER_SITE_OTHER): Payer: Self-pay | Admitting: Nurse Practitioner

## 2023-06-10 NOTE — Progress Notes (Signed)
  Subjective:  Patient ID: [name], female    DOB: 06-02-1951,  MRN: [id number]  Chief Complaint  Patient presents with   Wound Check    RM1: GRAFT wound care     72 y.o. female returns for ongoing wound care  Review of Systems: Negative except as noted in the HPI. Denies N/V/F/Ch.   Objective:   There were no vitals filed for this visit.   Body mass index is 24.94 kg/m. Constitutional Well developed. Well nourished.  Vascular Foot warm and well perfused. Capillary refill normal to all digits.  Calf is soft and supple, no posterior calf or knee pain, negative Homans' sign  Neurologic Normal speech. Oriented to person, place, and time. Epicritic sensation to light touch grossly present bilaterally.  Dermatologic Full-thickness submetatarsal 3 ulceration left foot measures 0.9 x 0.7 x 0.2 cm with surrounding hyperkeratosis.  Relatively unchanged dimensions size, surrounding hyperkeratosis has increased, granular wound bed remains.  There are no signs of infection.  Orthopedic: She has no tenderness to palpation noted about the surgical site.      Assessment:   1. 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 mellitus, with fat layer exposed, unspecified part of foot (HCC)      Plan:   1. Today patient presents for initial of InnovaMatrix AC 2. Ulceration noted at plantar left forefoot.  There is not peri wound erythema.  Odor absent.  Drainage is minimal. Wound bed shows good granulation.  Granulation tissue present in 100% of the ulceration.  Undermining and tunneling is absent. Post debridement the ulcer at the left forefoot submetatarsal 3 measures 0.9 x 0.7 cm x 0.2 cm).  Necrotic tissue and surrounding nonviable hyperkeratotic tissue was removed and debridement to the level of skin, subcutaneous tissue was completed. 3. Wound characteristics from last visit show 0% increase in granulation tissue, size reduction of 0 %. 4. Excisional debridement  performed through subcutaneous tissue with sharp scalpel to remove all necrotic tissue and biofilm.  Fatty layer was exposed.  Bleeding was controlled with direct pressure.  InnovaMatrix AC was applied and affixed with Mepitel non adherent wound dressing. Secondary dressings were applied. 5. Continue surgical shoe.  Reevaluation will take place in 1 week 6.  2.25 cm2 of product was utilized and 0 cm2 amount of product was wasted during application  7.  15 mm disc used with lot #[id number]  No follow-ups on file.

## 2023-06-15 ENCOUNTER — Ambulatory Visit (INDEPENDENT_AMBULATORY_CARE_PROVIDER_SITE_OTHER): Payer: Medicare Other | Admitting: Podiatry

## 2023-06-15 ENCOUNTER — Encounter: Payer: Self-pay | Admitting: Podiatry

## 2023-06-15 VITALS — Ht 68.0 in | Wt 164.0 lb

## 2023-06-15 DIAGNOSIS — L97522 Non-pressure chronic ulcer of other part of left foot with fat layer exposed: Secondary | ICD-10-CM | POA: Diagnosis not present

## 2023-06-15 DIAGNOSIS — E11621 Type 2 diabetes mellitus with foot ulcer: Secondary | ICD-10-CM | POA: Diagnosis not present

## 2023-06-16 NOTE — Progress Notes (Signed)
  Subjective:  Patient ID: [name], female    DOB: Jul 30, 1951,  MRN: [id number]  Chief Complaint  Patient presents with   Wound Check    PT is here for wound care on left foot.     72 y.o. female returns for ongoing wound care  Review of Systems: Negative except as noted in the HPI. Denies N/V/F/Ch.   Objective:   There were no vitals filed for this visit.   Body mass index is 24.94 kg/m. Constitutional Well developed. Well nourished.  Vascular Foot warm and well perfused. Capillary refill normal to all digits.  Calf is soft and supple, no posterior calf or knee pain, negative Homans' sign  Neurologic Normal speech. Oriented to person, place, and time. Epicritic sensation to light touch grossly present bilaterally.  Dermatologic Full-thickness submetatarsal 3 ulceration left foot measures 0.7 x 0.6 x 0.2 cm with surrounding hyperkeratosis.  Relatively unchanged dimensions size, surrounding hyperkeratosis has increased, granular wound bed remains.  There are no signs of infection.  Orthopedic: She has no tenderness to palpation noted about the surgical site.      Assessment:   1. 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 mellitus, with fat layer exposed, unspecified part of foot (HCC)      Plan:   1. Today patient presents for initial of InnovaMatrix AC 2. Ulceration noted at plantar left forefoot.  There is not peri wound erythema.  Odor absent.  Drainage is minimal. Wound bed shows good granulation.  Granulation tissue present in 100% of the ulceration.  Undermining and tunneling is absent. Post debridement the ulcer at the left forefoot submetatarsal 3 measures 0.7 x 0.6 cm x 0.2 cm).  Necrotic tissue and surrounding nonviable hyperkeratotic tissue was removed and debridement to the level of skin, subcutaneous tissue was completed. 3. Wound characteristics from last visit show 0% increase in granulation tissue, size reduction of 20 %. 4.  Excisional debridement performed through subcutaneous tissue with sharp scalpel to remove all necrotic tissue and biofilm.  Fatty layer was exposed.  Bleeding was controlled with direct pressure.  InnovaMatrix AC was applied and affixed with Mepitel non adherent wound dressing. Secondary dressings were applied. 5. Continue surgical shoe.  Reevaluation will take place in 1 week 6.  2.25 cm2 of product was utilized and 0 cm2 amount of product was wasted during application  7.  15 mm disc used with lot #[id number]  Today also discussed with her that while she has made some improvements in her measurements with 6 applications her wound is decreased from 1.1 cm x 0.9 cm to 0.7 x 0.6 cm.  This does represent some improvement but it has been quite slow.  We discussed alternative options including metatarsal head resection which likely would create a transfer ulcer to the second metatarsal and transmetatarsal amputation.  She is hesitant to proceed with either these options right now.  Could also continue long-term palliative wound care.  I will see her back in 1 week to reevaluate and we will decide if further applications would be advisable.  She will also discussed with her vascular surgeon about follow-up visit to reevaluate her circulation.  Return in about 1 week (around 06/22/2023) for wound care, Graft application to wound.

## 2023-06-22 ENCOUNTER — Ambulatory Visit (INDEPENDENT_AMBULATORY_CARE_PROVIDER_SITE_OTHER): Payer: Medicare Other | Admitting: Podiatry

## 2023-06-22 ENCOUNTER — Encounter: Payer: Self-pay | Admitting: Podiatry

## 2023-06-22 DIAGNOSIS — L97522 Non-pressure chronic ulcer of other part of left foot with fat layer exposed: Secondary | ICD-10-CM

## 2023-06-22 DIAGNOSIS — I739 Peripheral vascular disease, unspecified: Secondary | ICD-10-CM

## 2023-06-22 DIAGNOSIS — E11621 Type 2 diabetes mellitus with foot ulcer: Secondary | ICD-10-CM

## 2023-06-23 NOTE — Progress Notes (Signed)
  Subjective:  Patient ID: [name], female    DOB: Jan 20, 1951,  MRN: [id number]  Chief Complaint  Patient presents with   Wound Check    "It's not good."     72 y.o. female returns for ongoing wound care  Review of Systems: Negative except as noted in the HPI. Denies N/V/F/Ch.   Objective:   There were no vitals filed for this visit.   There is no height or weight on file to calculate BMI. Constitutional Well developed. Well nourished.  Vascular Foot warm and well perfused. Capillary refill normal to all digits.  Calf is soft and supple, no posterior calf or knee pain, negative Homans' sign  Neurologic Normal speech. Oriented to person, place, and time. Epicritic sensation to light touch grossly present bilaterally.  Dermatologic Full-thickness submetatarsal 3 ulceration left foot measures 0.8 x 0.5 x 0.3 cm with surrounding hyperkeratosis.  Relatively unchanged dimensions size, surrounding hyperkeratosis has increased, granular wound bed remains.  There are no signs of infection.  Orthopedic: She has no tenderness to palpation noted about the surgical site.      Assessment:   1. 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 mellitus, with fat layer exposed, unspecified part of foot (HCC)      Plan:   Wound dimensions largely unchanged.  No skin substitute was applied today.  We discussed her progress and while she has had some decrease in wound size this has largely been stagnated over the last few applications.  I recommended we see skin substitute applications at this time.  She has an upcoming appoint with her PCP next month to reevaluate her A1c.  She is also going to contact Dr. [name] for follow-up I have placed a new referral since it has been sometime since she saw him and this is what his office had requested be done.  The wound was debrided of all nonviable tissue surrounding hyperkeratosis and fibrin slough to the subcutaneous layer today.   Postrepair measurements are noted above.  She will continue using forefoot offloading shoe.  Prisma collagen dressing applied and she will apply this at home daily.  Return in 3 weeks for ongoing wound care.  No follow-ups on file.

## 2023-06-24 ENCOUNTER — Telehealth: Payer: Self-pay

## 2023-06-24 NOTE — Telephone Encounter (Signed)
Patient called and left a message. Dr. Marijean Heath office needs her referral faxed - Referral, office note and demographics faxed to [facility] Phone [phone] Fax [phone]

## 2023-06-29 ENCOUNTER — Telehealth: Payer: Self-pay | Admitting: Podiatry

## 2023-06-29 ENCOUNTER — Ambulatory Visit: Payer: Medicare Other | Admitting: Podiatry

## 2023-06-29 MED ORDER — MUPIROCIN 2 % EX OINT: 1.0000 | TOPICAL_OINTMENT | Freq: Two times a day (BID) | CUTANEOUS | 2 refills | Status: DC

## 2023-06-29 NOTE — Telephone Encounter (Signed)
Patient called stating she fell over a chair and hurt her foot. She states she would like a RX for the [facility] it seems to work well. She uses [facility] in [place]

## 2023-07-04 ENCOUNTER — Ambulatory Visit: Payer: Medicare Other | Admitting: Podiatry

## 2023-07-06 ENCOUNTER — Ambulatory Visit: Payer: Medicare Other | Admitting: Podiatry

## 2023-07-13 ENCOUNTER — Other Ambulatory Visit (INDEPENDENT_AMBULATORY_CARE_PROVIDER_SITE_OTHER): Payer: Self-pay | Admitting: Vascular Surgery

## 2023-07-13 ENCOUNTER — Ambulatory Visit (INDEPENDENT_AMBULATORY_CARE_PROVIDER_SITE_OTHER): Payer: Medicare Other | Admitting: Podiatry

## 2023-07-13 ENCOUNTER — Encounter: Payer: Self-pay | Admitting: Podiatry

## 2023-07-13 VITALS — Ht 68.0 in | Wt 164.0 lb

## 2023-07-13 DIAGNOSIS — L97522 Non-pressure chronic ulcer of other part of left foot with fat layer exposed: Secondary | ICD-10-CM | POA: Diagnosis not present

## 2023-07-13 DIAGNOSIS — E11621 Type 2 diabetes mellitus with foot ulcer: Secondary | ICD-10-CM

## 2023-07-13 DIAGNOSIS — Z9889 Other specified postprocedural states: Secondary | ICD-10-CM

## 2023-07-13 NOTE — Progress Notes (Signed)
  Subjective:  Patient ID: [name], female    DOB: 04-Dec-1950,  MRN: [id number]  Chief Complaint  Patient presents with   Wound Check    Patient is here for diabetic ulcer of left foot wound is looking better Crack beginning to form to the right side of foot below wound     72 y.o. female returns for ongoing wound care  Review of Systems: Negative except as noted in the HPI. Denies N/V/F/Ch.   Objective:   There were no vitals filed for this visit.   Body mass index is 24.94 kg/m. Constitutional Well developed. Well nourished.  Vascular Foot warm and well perfused. Capillary refill normal to all digits.  Calf is soft and supple, no posterior calf or knee pain, negative Homans' sign  Neurologic Normal speech. Oriented to person, place, and time. Epicritic sensation to light touch grossly present bilaterally.  Dermatologic Full-thickness submetatarsal 3 ulceration left foot measures 0.8 x 0.6 x 0.3 cm with surrounding hyperkeratosis.  Relatively unchanged dimensions size, surrounding hyperkeratosis has increased, granular wound bed remains.  There are no signs of infection.  Orthopedic: She has no tenderness to palpation noted about the surgical site.      Assessment:   1. 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 mellitus, with fat layer exposed, unspecified part of foot (HCC)      Plan:   Wound dimensions largely unchanged.  No signs of infection or worsening.  Has PCP appointment this month to check A1c and she has upcoming vascular testing scheduled as well.  The wound was excisionally debrided of all nonviable tissue surrounding hyperkeratosis and fibrin slough to the subcutaneous layer today.  Post measurements are noted above.  She will continue using forefoot offloading shoe.  Puracol collagen dressing applied and she will apply this at home daily.    Return in about 26 days (around 08/08/2023) for wound care.

## 2023-07-15 ENCOUNTER — Ambulatory Visit (INDEPENDENT_AMBULATORY_CARE_PROVIDER_SITE_OTHER): Payer: Medicare Other

## 2023-07-15 DIAGNOSIS — I739 Peripheral vascular disease, unspecified: Secondary | ICD-10-CM

## 2023-07-15 DIAGNOSIS — Z9889 Other specified postprocedural states: Secondary | ICD-10-CM

## 2023-07-19 LAB — VAS US ABI WITH/WO TBI

## 2023-08-08 ENCOUNTER — Ambulatory Visit: Payer: Medicare Other | Admitting: Podiatry

## 2023-08-15 ENCOUNTER — Encounter: Payer: Self-pay | Admitting: Podiatry

## 2023-08-15 ENCOUNTER — Ambulatory Visit (INDEPENDENT_AMBULATORY_CARE_PROVIDER_SITE_OTHER): Payer: Medicare Other | Admitting: Podiatry

## 2023-08-15 DIAGNOSIS — L97522 Non-pressure chronic ulcer of other part of left foot with fat layer exposed: Secondary | ICD-10-CM | POA: Diagnosis not present

## 2023-08-15 DIAGNOSIS — E11621 Type 2 diabetes mellitus with foot ulcer: Secondary | ICD-10-CM

## 2023-08-15 NOTE — Progress Notes (Signed)
  Subjective:  Patient ID: [name], female    DOB: 1951-04-04,  MRN: [id number]  Chief Complaint  Patient presents with   Routine Post Op    It doesn't hurt or anything.  I just pray that it is smaller.     73 y.o. female returns for ongoing wound care  Review of Systems: Negative except as noted in the HPI. Denies N/V/F/Ch.   Objective:   There were no vitals filed for this visit.   There is no height or weight on file to calculate BMI. Constitutional Well developed. Well nourished.  Vascular Foot warm and well perfused. Capillary refill normal to all digits.  Calf is soft and supple, no posterior calf or knee pain, negative Homans' sign  Neurologic Normal speech. Oriented to person, place, and time. Epicritic sensation to light touch grossly present bilaterally.  Dermatologic Full-thickness submetatarsal 3 ulceration left foot measures 1.0 x 0.7 x 0.3 cm with surrounding hyperkeratosis.  Relatively unchanged dimensions size, surrounding hyperkeratosis has increased, granular wound bed remains.  There are no signs of infection.  Orthopedic: She has no tenderness to palpation noted about the surgical site.      Assessment:   1. 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 mellitus, with fat layer exposed, unspecified part of foot (HCC)      Plan:   Wound dimensions increased slightly due to no debridement in the last month.  We will schedule closer follow-up and debride every 2 to 3 weeks.  No signs of infection or worsening.  Has PCP appointment this month to check A1c.  Her ABIs are largely unchanged..  The wound was excisionally debrided of all nonviable tissue surrounding hyperkeratosis and fibrin slough to the subcutaneous layer today.  Post measurements are noted above.  She will continue using forefoot offloading shoe.     No follow-ups on file.

## 2023-08-16 ENCOUNTER — Telehealth: Payer: Self-pay | Admitting: Podiatry

## 2023-08-16 NOTE — Telephone Encounter (Signed)
 Patient called and stated that she had tried to get her wound care supplies that was sent to [facility]. They stated they have not gotten the order from our office. They ([facility]) stated to have called our office and we told them we've never seen this patient (According to the patient). The patient is not satisfied with this wound clinic office and would like to know if there is possibly a different one she could go to( They were rude and not helpful to the patient). Please call and advise, thank you.

## 2023-08-16 NOTE — Telephone Encounter (Signed)
 Tammy with [facility] called stating that patient called and stated that there was a new order that was supposed to be sent in.  Tammy [phone]

## 2023-08-16 NOTE — Telephone Encounter (Signed)
 Patient needs wound supplies mailed to her house.  States she is out.

## 2023-08-29 ENCOUNTER — Ambulatory Visit: Payer: Medicare Other | Admitting: Podiatry

## 2023-09-07 ENCOUNTER — Ambulatory Visit (INDEPENDENT_AMBULATORY_CARE_PROVIDER_SITE_OTHER): Payer: Medicare Other | Admitting: Podiatry

## 2023-09-07 ENCOUNTER — Encounter: Payer: Self-pay | Admitting: Podiatry

## 2023-09-07 VITALS — Ht 68.0 in | Wt 164.0 lb

## 2023-09-07 DIAGNOSIS — E11621 Type 2 diabetes mellitus with foot ulcer: Secondary | ICD-10-CM | POA: Diagnosis not present

## 2023-09-07 DIAGNOSIS — L97522 Non-pressure chronic ulcer of other part of left foot with fat layer exposed: Secondary | ICD-10-CM | POA: Diagnosis not present

## 2023-09-07 NOTE — Progress Notes (Signed)
  Subjective:  Patient ID: [name], female    DOB: 04/11/1951,  MRN: [id number]  Chief Complaint  Patient presents with   Diabetic Ulcer    Pt is here to f/u on diabetic ulcer to left foot.     73 y.o. female returns for ongoing wound care  Review of Systems: Negative except as noted in the HPI. Denies N/V/F/Ch.   Objective:   There were no vitals filed for this visit.   Body mass index is 24.94 kg/m. Constitutional Well developed. Well nourished.  Vascular Foot warm and well perfused. Capillary refill normal to all digits.  Calf is soft and supple, no posterior calf or knee pain, negative Homans' sign  Neurologic Normal speech. Oriented to person, place, and time. Epicritic sensation to light touch grossly present bilaterally.  Dermatologic Full-thickness submetatarsal 3 ulceration left foot measures 1.0 x 0.7 x 0.3 cm with surrounding hyperkeratosis.  Relatively unchanged dimensions size, surrounding hyperkeratosis has increased, granular wound bed remains.  There are no signs of infection.  Orthopedic: She has no tenderness to palpation noted about the surgical site.      Assessment:   1. 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 mellitus, with fat layer exposed, unspecified part of foot (HCC)       Plan:   Wound dimensions are unchanged.  We will schedule closer follow-up and debride every 2 to 3 weeks.  No signs of infection or worsening.  The wound was excisionally debrided of all nonviable tissue surrounding hyperkeratosis and fibrin slough to the subcutaneous layer today.  Post measurements are noted above.  She will continue using forefoot offloading shoe.  New x-rays next visit   Return in about 2 weeks (around 09/21/2023) for wound care, new left foot x-rays.

## 2023-09-21 ENCOUNTER — Ambulatory Visit: Payer: Medicare Other | Admitting: Podiatry

## 2023-09-28 ENCOUNTER — Ambulatory Visit: Payer: Medicare Other | Admitting: Podiatry

## 2023-10-10 ENCOUNTER — Encounter: Admission: RE | Admit: 2023-10-10 | Payer: Medicare Other | Source: Ambulatory Visit

## 2023-10-13 ENCOUNTER — Ambulatory Visit (INDEPENDENT_AMBULATORY_CARE_PROVIDER_SITE_OTHER): Payer: Medicare Other | Admitting: Nurse Practitioner

## 2023-10-13 ENCOUNTER — Encounter (INDEPENDENT_AMBULATORY_CARE_PROVIDER_SITE_OTHER): Payer: Medicare Other

## 2023-10-19 ENCOUNTER — Ambulatory Visit (INDEPENDENT_AMBULATORY_CARE_PROVIDER_SITE_OTHER): Admitting: Podiatry

## 2023-10-19 ENCOUNTER — Ambulatory Visit (INDEPENDENT_AMBULATORY_CARE_PROVIDER_SITE_OTHER)

## 2023-10-19 ENCOUNTER — Other Ambulatory Visit: Payer: Self-pay | Admitting: Podiatry

## 2023-10-19 DIAGNOSIS — E11621 Type 2 diabetes mellitus with foot ulcer: Secondary | ICD-10-CM

## 2023-10-19 DIAGNOSIS — M86172 Other acute osteomyelitis, left ankle and foot: Secondary | ICD-10-CM

## 2023-10-19 DIAGNOSIS — M86171 Other acute osteomyelitis, right ankle and foot: Secondary | ICD-10-CM | POA: Diagnosis not present

## 2023-10-19 DIAGNOSIS — L97412 Non-pressure chronic ulcer of right heel and midfoot with fat layer exposed: Secondary | ICD-10-CM

## 2023-10-19 MED ORDER — DOXYCYCLINE HYCLATE 100 MG PO CAPS: 100.0000 mg | ORAL_CAPSULE | Freq: Two times a day (BID) | ORAL | 0 refills | Status: DC

## 2023-10-19 MED ORDER — GENTAMICIN SULFATE 0.1 % EX CREA: 1.0000 | TOPICAL_CREAM | Freq: Every day | CUTANEOUS | 0 refills | Status: DC

## 2023-10-20 ENCOUNTER — Encounter: Payer: Self-pay | Admitting: Podiatry

## 2023-10-20 NOTE — H&P (View-Only) (Signed)
 Subjective:  Patient ID: [name], female    DOB: 10-03-1950,  MRN: [id number]  Chief Complaint  Patient presents with   Wound Check    "The left one doesn't hurt.  The right one I hit on the door frame.  The 3rd right toe doesn't look good.  I had a callus on my right heel and I pulled the skin.  Now, I have a big place on it.  The toe doesn't look good at all."     73 y.o. female returns for ongoing wound care.  Has a new issue on the right third toe as well as the heel.  Review of Systems: Negative except as noted in the HPI. Denies N/V/F/Ch.   Objective:   There were no vitals filed for this visit.   There is no height or weight on file to calculate BMI. Constitutional Well developed. Well nourished.  Vascular Foot warm and well perfused. Capillary refill normal to all digits.  Calf is soft and supple, no posterior calf or knee pain, negative Homans' sign  Neurologic Normal speech. Oriented to person, place, and time. Epicritic sensation to light touch grossly present bilaterally.  Dermatologic Full-thickness submetatarsal 3 ulceration left foot measures 1.0 x 0.8 x 0.3 cm with surrounding hyperkeratosis.  Relatively unchanged dimensions size, surrounding hyperkeratosis has increased, granular wound bed remains.  There are no signs of infection.  On the right foot she has a new full-thickness ulcer with exposed bone of the right third toe and necrotic tissue, no purulent drainage or malodor she has a full-thickness ulcer approximately 2.5 x 2.0 cm on the plantar heel with exposed subcutaneous tissue without signs of infection and surrounding hyperkeratosis here  Orthopedic: She has no tenderness to palpation noted about the surgical site.    Relatively unchanged alignment appearance of the left foot on her radiographs, her right foot has exposure of the bone and osteolysis of the distal phalanx of the third toe  Assessment:   1. Acute osteomyelitis of right ankle or  foot (HCC)   2. Acute osteomyelitis of left ankle or foot (HCC)   3. Diabetic ulcer of right heel associated with type 2 diabetes mellitus, with fat layer exposed (HCC)       Plan:   Unfortunate has new ulcerations on the right third toe and plantar heel.  The plantar heel is full-thickness but appears to be healing well with granular tissue and exposed subcutaneous tissue I debrided this of nonviable tissue and applied Iodosorb and a bandage.  The third toe is much more severe and is exposed bone.  Discussed with her likely at least partial to full amputation of the toe is going to be necessary.  She has upcoming vascular studies in April and we will see if we can move these up before next week.  Plan for outpatient surgery next Friday for third toe amputation and application of a skin substitute and debridement of the plantar heel ulceration I recommended that she get a knee scooter to be nonweightbearing after surgery for the heel ulcer which is going be much more severe.  I likely expect this will deteriorate her left foot ulcer temporarily and we will deal this at a later date.  Rx for gentamicin cream for topical antibiotic use sent to pharmacy and I put her on doxycycline as well.  A wound culture was taken. MRI ordered to evaluate extent for surgical planning   Surgical plan:  Procedure: -Right foot third toe amputation and  heel debridement with graft application  Location: -[facility]  Anesthesia plan: -MAC with local  Postoperative pain plan: - Tylenol 1000 mg every 6 hours, tramadol 50 mg every 6 hours  DVT prophylaxis: -None required  WB Restrictions / DME needs: -NWB in surgical shoe, this was dispensed today with a peg assist device to offload the heel    No follow-ups on file.

## 2023-10-20 NOTE — Addendum Note (Signed)
 Addended byLilian Kapur, Kendallyn Lippold R on: 10/20/2023 04:03 PM   Modules accepted: Orders

## 2023-10-20 NOTE — Progress Notes (Addendum)
 Subjective:  Patient ID: [name], female    DOB: 10-03-1950,  MRN: [id number]  Chief Complaint  Patient presents with   Wound Check    "The left one doesn't hurt.  The right one I hit on the door frame.  The 3rd right toe doesn't look good.  I had a callus on my right heel and I pulled the skin.  Now, I have a big place on it.  The toe doesn't look good at all."     73 y.o. female returns for ongoing wound care.  Has a new issue on the right third toe as well as the heel.  Review of Systems: Negative except as noted in the HPI. Denies N/V/F/Ch.   Objective:   There were no vitals filed for this visit.   There is no height or weight on file to calculate BMI. Constitutional Well developed. Well nourished.  Vascular Foot warm and well perfused. Capillary refill normal to all digits.  Calf is soft and supple, no posterior calf or knee pain, negative Homans' sign  Neurologic Normal speech. Oriented to person, place, and time. Epicritic sensation to light touch grossly present bilaterally.  Dermatologic Full-thickness submetatarsal 3 ulceration left foot measures 1.0 x 0.8 x 0.3 cm with surrounding hyperkeratosis.  Relatively unchanged dimensions size, surrounding hyperkeratosis has increased, granular wound bed remains.  There are no signs of infection.  On the right foot she has a new full-thickness ulcer with exposed bone of the right third toe and necrotic tissue, no purulent drainage or malodor she has a full-thickness ulcer approximately 2.5 x 2.0 cm on the plantar heel with exposed subcutaneous tissue without signs of infection and surrounding hyperkeratosis here  Orthopedic: She has no tenderness to palpation noted about the surgical site.    Relatively unchanged alignment appearance of the left foot on her radiographs, her right foot has exposure of the bone and osteolysis of the distal phalanx of the third toe  Assessment:   1. Acute osteomyelitis of right ankle or  foot (HCC)   2. Acute osteomyelitis of left ankle or foot (HCC)   3. Diabetic ulcer of right heel associated with type 2 diabetes mellitus, with fat layer exposed (HCC)       Plan:   Unfortunate has new ulcerations on the right third toe and plantar heel.  The plantar heel is full-thickness but appears to be healing well with granular tissue and exposed subcutaneous tissue I debrided this of nonviable tissue and applied Iodosorb and a bandage.  The third toe is much more severe and is exposed bone.  Discussed with her likely at least partial to full amputation of the toe is going to be necessary.  She has upcoming vascular studies in April and we will see if we can move these up before next week.  Plan for outpatient surgery next Friday for third toe amputation and application of a skin substitute and debridement of the plantar heel ulceration I recommended that she get a knee scooter to be nonweightbearing after surgery for the heel ulcer which is going be much more severe.  I likely expect this will deteriorate her left foot ulcer temporarily and we will deal this at a later date.  Rx for gentamicin cream for topical antibiotic use sent to pharmacy and I put her on doxycycline as well.  A wound culture was taken. MRI ordered to evaluate extent for surgical planning   Surgical plan:  Procedure: -Right foot third toe amputation and  heel debridement with graft application  Location: -[facility]  Anesthesia plan: -MAC with local  Postoperative pain plan: - Tylenol 1000 mg every 6 hours, tramadol 50 mg every 6 hours  DVT prophylaxis: -None required  WB Restrictions / DME needs: -NWB in surgical shoe, this was dispensed today with a peg assist device to offload the heel    No follow-ups on file.

## 2023-10-21 ENCOUNTER — Ambulatory Visit: Admission: RE | Admit: 2023-10-21 | Source: Ambulatory Visit

## 2023-10-21 ENCOUNTER — Telehealth: Payer: Self-pay | Admitting: Podiatry

## 2023-10-21 DIAGNOSIS — E11621 Type 2 diabetes mellitus with foot ulcer: Secondary | ICD-10-CM

## 2023-10-21 NOTE — Telephone Encounter (Signed)
 Patient is requesting a prescription for knee walker, also patient is stating Dr. Jilda Panda is requesting order from Dr. Lilian Kapur, blood pressure of right foot and right leg to see if there is adequate blood flow for surgery/Fax#: [phone]

## 2023-10-22 ENCOUNTER — Ambulatory Visit
Admission: RE | Admit: 2023-10-22 | Discharge: 2023-10-22 | Disposition: A | Discharge status: Home / Self Care | Source: Ambulatory Visit | Attending: Podiatry | Admitting: Podiatry

## 2023-10-22 DIAGNOSIS — M86172 Other acute osteomyelitis, left ankle and foot: Secondary | ICD-10-CM

## 2023-10-24 ENCOUNTER — Other Ambulatory Visit: Payer: Self-pay

## 2023-10-24 ENCOUNTER — Encounter
Admission: RE | Admit: 2023-10-24 | Discharge: 2023-10-24 | Disposition: A | Discharge status: Home / Self Care | Source: Ambulatory Visit | Attending: Podiatry

## 2023-10-24 ENCOUNTER — Encounter
Admission: RE | Admit: 2023-10-24 | Discharge: 2023-10-24 | Disposition: A | Discharge status: Home / Self Care | Source: Ambulatory Visit | Attending: Podiatry | Admitting: Podiatry

## 2023-10-24 VITALS — Ht 68.0 in | Wt 149.0 lb

## 2023-10-24 DIAGNOSIS — Z01810 Encounter for preprocedural cardiovascular examination: Secondary | ICD-10-CM

## 2023-10-24 DIAGNOSIS — I25708 Atherosclerosis of coronary artery bypass graft(s), unspecified, with other forms of angina pectoris: Secondary | ICD-10-CM | POA: Insufficient documentation

## 2023-10-24 DIAGNOSIS — Z01818 Encounter for other preprocedural examination: Secondary | ICD-10-CM | POA: Insufficient documentation

## 2023-10-24 DIAGNOSIS — Z01812 Encounter for preprocedural laboratory examination: Secondary | ICD-10-CM

## 2023-10-24 DIAGNOSIS — Z951 Presence of aortocoronary bypass graft: Secondary | ICD-10-CM | POA: Diagnosis not present

## 2023-10-24 HISTORY — DX: Type 2 diabetes mellitus with diabetic peripheral angiopathy without gangrene: E11.51

## 2023-10-24 HISTORY — DX: Stress incontinence (female) (male): N39.3

## 2023-10-24 HISTORY — DX: Atherosclerotic heart disease of native coronary artery without angina pectoris: I25.10

## 2023-10-24 HISTORY — DX: Local infection of the skin and subcutaneous tissue, unspecified: L08.9

## 2023-10-24 HISTORY — DX: Local infection of the skin and subcutaneous tissue, unspecified: E11.628

## 2023-10-24 HISTORY — DX: Age-related nuclear cataract, bilateral: H25.13

## 2023-10-24 LAB — BASIC METABOLIC PANEL
Anion gap: 8 (ref 5–15)
BUN: 28 mg/dL — ABNORMAL HIGH (ref 8–23)
CO2: 24 mmol/L (ref 22–32)
Calcium: 9.6 mg/dL (ref 8.9–10.3)
Chloride: 104 mmol/L (ref 98–111)
Creatinine, Ser: 0.94 mg/dL (ref 0.44–1.00)
GFR, Estimated: >60 mL/min (ref >60)
Glucose, Bld: 62 mg/dL — ABNORMAL LOW (ref 70–99)
Potassium: 5.4 mmol/L — ABNORMAL HIGH (ref 3.5–5.1)
Sodium: 136 mmol/L (ref 135–145)

## 2023-10-24 LAB — CBC
HCT: 37.1 % (ref 36.0–46.0)
Hemoglobin: 12.1 g/dL (ref 12.0–15.0)
MCH: 28.7 pg (ref 26.0–34.0)
MCHC: 32.6 g/dL (ref 30.0–36.0)
MCV: 87.9 fL (ref 80.0–100.0)
Platelets: 258 10*3/uL (ref 150–400)
RBC: 4.22 MIL/uL (ref 3.87–5.11)
RDW: 13.2 % (ref 11.5–15.5)
WBC: 5.8 10*3/uL (ref 4.0–10.5)
nRBC: 0 % (ref 0.0–0.2)

## 2023-10-24 LAB — WOUND CULTURE

## 2023-10-24 NOTE — Patient Instructions (Addendum)
 Your procedure is scheduled on: Friday, March 21 Report to the Registration Desk on the 1st floor of the [facility] To find out your arrival time, please call [phone] between 1PM - 3PM on: Thursday, March 20 If your arrival time is 6:00 am, do not arrive before that time as the [facility] entrance doors do not open until 6:00 am.  REMEMBER: Instructions that are not followed completely may result in serious medical risk, up to and including death; or upon the discretion of your surgeon and anesthesiologist your surgery may need to be rescheduled.  Do not eat food after midnight the night before surgery.  No gum chewing or hard candies.  You may however, drink water up to 2 hours before you are scheduled to arrive for your surgery. Do not drink anything within 2 hours of your scheduled arrival time.  In addition, your doctor has ordered for you to drink the provided:  Gatorade G2 Drinking this carbohydrate drink up to two hours before surgery helps to reduce insulin resistance and improve patient outcomes. Please complete drinking 2 hours before scheduled arrival time.  One week prior to surgery: Stop Anti-inflammatories (NSAIDS) such as Advil, Aleve, Ibuprofen, Motrin, Naproxen, Naprosyn and Aspirin based products such as Excedrin, Goody's Powder, BC Powder. Stop ANY OVER THE COUNTER supplements until after surgery. Stop cinnamon  You may however, continue to take Tylenol if needed for pain up until the day of surgery.  Mounjaro - hold for 7 days before surgery. Do NOT take any Mounjaro until AFTER surgery. Taking these types of medications delays gastric emptying resulting in possible lung aspiration during surgery.  Plavix (clopidogrel) - do NOT take any more until AFTER surgery. Last day was March 16. Resume AFTER surgery per surgeon instruction.  Aspirin - continue taking the 81 mg aspirin up until the day of surgery.  Continue taking all of your other prescription  medications up until the day of surgery.  ON THE DAY OF SURGERY ONLY TAKE THESE MEDICATIONS WITH SIPS OF WATER:  Amlodipine Atenolol Doxycycline  No Alcohol for 24 hours before or after surgery.  No Smoking including e-cigarettes for 24 hours before surgery.  No chewable tobacco products for at least 6 hours before surgery.  No nicotine patches on the day of surgery.  Do not use any "recreational" drugs for at least a week (preferably 2 weeks) before your surgery.  Please be advised that the combination of cocaine and anesthesia may have negative outcomes, up to and including death. If you test positive for cocaine, your surgery will be cancelled.  On the morning of surgery brush your teeth with toothpaste and water, you may rinse your mouth with mouthwash if you wish. Do not swallow any toothpaste or mouthwash.  Use CHG Soap as directed on instruction sheet.  Do not wear jewelry, make-up, hairpins, clips or nail polish.  For welded (permanent) jewelry: bracelets, anklets, waist bands, etc.  Please have this removed prior to surgery.  If it is not removed, there is a chance that hospital personnel will need to cut it off on the day of surgery.  Do not wear lotions, powders, or perfumes.   Do not shave body hair from the neck down 48 hours before surgery.  Contact lenses, hearing aids and dentures may not be worn into surgery.  Do not bring valuables to the hospital. [facility] is not responsible for any missing/lost belongings or valuables.   Notify your doctor if there is any change  in your medical condition (cold, fever, infection).  Wear comfortable clothing (specific to your surgery type) to the hospital.  After surgery, you can help prevent lung complications by doing breathing exercises.  Take deep breaths and cough every 1-2 hours. Your doctor may order a device called an Incentive Spirometer to help you take deep breaths.  If you are being discharged the day of  surgery, you will not be allowed to drive home. You will need a responsible individual to drive you home and stay with you for 24 hours after surgery.   If you are taking public transportation, you will need to have a responsible individual with you.  Please call the Pre-admissions Testing Dept. at [phone] if you have any questions about these instructions.  Surgery Visitation Policy:  Patients having surgery or a procedure may have two visitors.  Children under the age of 31 must have an adult with them who is not the patient.  Temporary Visitor Restrictions Due to increasing cases of flu, RSV and COVID-19: Children ages 32 and under will not be able to visit patients in [facility] hospitals under most circumstances.     Preparing for Surgery with CHLORHEXIDINE GLUCONATE (CHG) Soap  Chlorhexidine Gluconate (CHG) Soap  o An antiseptic cleaner that kills germs and bonds with the skin to continue killing germs even after washing  o Used for showering the night before surgery and morning of surgery  Before surgery, you can play an important role by reducing the number of germs on your skin.  CHG (Chlorhexidine gluconate) soap is an antiseptic cleanser which kills germs and bonds with the skin to continue killing germs even after washing.  Please do not use if you have an allergy to CHG or antibacterial soaps. If your skin becomes reddened/irritated stop using the CHG.  1. Shower the NIGHT BEFORE SURGERY and the MORNING OF SURGERY with CHG soap.  2. If you choose to wash your hair, wash your hair first as usual with your normal shampoo.  3. After shampooing, rinse your hair and body thoroughly to remove the shampoo.  4. Use CHG as you would any other liquid soap. You can apply CHG directly to the skin and wash gently with a scrungie or a clean washcloth.  5. Apply the CHG soap to your body only from the neck down. Do not use on open wounds or open sores. Avoid contact with  your eyes, ears, mouth, and genitals (private parts). Wash face and genitals (private parts) with your normal soap.  6. Wash thoroughly, paying special attention to the area where your surgery will be performed.  7. Thoroughly rinse your body with warm water.  8. Do not shower/wash with your normal soap after using and rinsing off the CHG soap.  9. Pat yourself dry with a clean towel.  10. Wear clean pajamas to bed the night before surgery.  12. Place clean sheets on your bed the night of your first shower and do not sleep with pets.  13. Shower again with the CHG soap on the day of surgery prior to arriving at the hospital.  14. Do not apply any deodorants/lotions/powders.  15. Please wear clean clothes to the hospital.

## 2023-10-24 NOTE — Pre-Procedure Instructions (Signed)
 Message to Dr. Lilian Kapur (surgeon) about when the patient needs to stop taking plavix and aspirin. Replied stop taking Plavix now and continue aspirin up until the day of surgery (just do not take day of). Also, replied that it was ok that the patient use a Purewick preoperatively per patient request. Last day plavix was taken was 10/23/23. Surgery scheduled for 10/28/23.

## 2023-10-25 ENCOUNTER — Telehealth: Payer: Self-pay | Admitting: Podiatry

## 2023-10-25 NOTE — Telephone Encounter (Signed)
 Pt calling again needing a rx for the knee scooter prior to her surgery and also needing an order sent to  Dr Lorretta Harp for them to do the blood pressure of right foot and leg on Thursday before surgery on Friday.  Fax number to that office is [phone].If Dr Lorretta Harp does not get orders they will cancel the appt and she will have to cxl surgery.Please call pt with information

## 2023-10-25 NOTE — Progress Notes (Signed)
  [facility] Perioperative Services: Pre-Admission/Anesthesia Testing  Abnormal Lab Notification   Date: 10/25/23  Name: [name] MRN:   [id number]  Re: Abnormal labs noted during PAT appointment   Notified:    Provider Name Provider Role Notification Mode  McDonald, Rachelle Hora, DPM Podiatry (Surgeon) Routed and/or faxed via [facility]   ABNORMAL LAB VALUE(S):   Lab Results  Component Value Date   K 5.4 (H) 10/24/2023   Clinical Information and Notes:  [name] is scheduled for a AMPUTATION, TOE (Right: Toe); APPLICATION, ALLOGRAFT, SKIN (Right) on 10/28/2023.   Order entered to recheck K+ on the day of surgery to ensure that patient is safe to proceed with anesthesia for the planned surgical intervention.   Quentin Mulling, MSN, APRN, FNP-C, CEN [facility]  Perioperative Services Nurse Practitioner Phone: [phone] Fax: [phone] 10/25/23 8:13 AM

## 2023-10-25 NOTE — Telephone Encounter (Signed)
 Called cell and voicemail is full.   Lvm on home number that the orders have been faxed to Dr Lorretta Harp again today but that they are also in the epic system so they can see them. As far as the scooter orders they have been in the system since last week and if she would like she can stop in the [place] office and they can print them for her.

## 2023-10-27 ENCOUNTER — Encounter (INDEPENDENT_AMBULATORY_CARE_PROVIDER_SITE_OTHER): Payer: Self-pay | Admitting: Nurse Practitioner

## 2023-10-27 ENCOUNTER — Ambulatory Visit (INDEPENDENT_AMBULATORY_CARE_PROVIDER_SITE_OTHER)

## 2023-10-27 ENCOUNTER — Ambulatory Visit (INDEPENDENT_AMBULATORY_CARE_PROVIDER_SITE_OTHER): Admitting: Nurse Practitioner

## 2023-10-27 VITALS — BP 175/84 | HR 66 | Resp 18 | Ht 68.0 in | Wt 143.6 lb

## 2023-10-27 DIAGNOSIS — L97412 Non-pressure chronic ulcer of right heel and midfoot with fat layer exposed: Secondary | ICD-10-CM | POA: Diagnosis not present

## 2023-10-27 DIAGNOSIS — I70299 Other atherosclerosis of native arteries of extremities, unspecified extremity: Secondary | ICD-10-CM

## 2023-10-27 DIAGNOSIS — E1151 Type 2 diabetes mellitus with diabetic peripheral angiopathy without gangrene: Secondary | ICD-10-CM

## 2023-10-27 DIAGNOSIS — L97909 Non-pressure chronic ulcer of unspecified part of unspecified lower leg with unspecified severity: Secondary | ICD-10-CM | POA: Diagnosis not present

## 2023-10-27 DIAGNOSIS — I10 Essential (primary) hypertension: Secondary | ICD-10-CM | POA: Diagnosis not present

## 2023-10-27 DIAGNOSIS — E11621 Type 2 diabetes mellitus with foot ulcer: Secondary | ICD-10-CM

## 2023-10-28 ENCOUNTER — Ambulatory Visit: Payer: Self-pay | Admitting: Urgent Care

## 2023-10-28 ENCOUNTER — Other Ambulatory Visit: Payer: Self-pay

## 2023-10-28 ENCOUNTER — Encounter: Payer: Self-pay | Admitting: Podiatry

## 2023-10-28 ENCOUNTER — Ambulatory Visit
Admission: RE | Admit: 2023-10-28 | Discharge: 2023-10-28 | Disposition: A | Discharge status: Home / Self Care | Attending: Podiatry | Admitting: Podiatry

## 2023-10-28 ENCOUNTER — Encounter
Admission: RE | Disposition: A | Discharge status: Home / Self Care | Payer: Self-pay | Source: Home / Self Care | Attending: Podiatry

## 2023-10-28 DIAGNOSIS — E1142 Type 2 diabetes mellitus with diabetic polyneuropathy: Secondary | ICD-10-CM | POA: Diagnosis not present

## 2023-10-28 DIAGNOSIS — L97412 Non-pressure chronic ulcer of right heel and midfoot with fat layer exposed: Secondary | ICD-10-CM | POA: Diagnosis not present

## 2023-10-28 DIAGNOSIS — Z01812 Encounter for preprocedural laboratory examination: Secondary | ICD-10-CM

## 2023-10-28 DIAGNOSIS — M86171 Other acute osteomyelitis, right ankle and foot: Secondary | ICD-10-CM | POA: Insufficient documentation

## 2023-10-28 DIAGNOSIS — M86172 Other acute osteomyelitis, left ankle and foot: Secondary | ICD-10-CM | POA: Diagnosis not present

## 2023-10-28 DIAGNOSIS — E11621 Type 2 diabetes mellitus with foot ulcer: Secondary | ICD-10-CM | POA: Insufficient documentation

## 2023-10-28 DIAGNOSIS — E08621 Diabetes mellitus due to underlying condition with foot ulcer: Secondary | ICD-10-CM | POA: Diagnosis not present

## 2023-10-28 DIAGNOSIS — M86671 Other chronic osteomyelitis, right ankle and foot: Secondary | ICD-10-CM | POA: Diagnosis present

## 2023-10-28 DIAGNOSIS — E875 Hyperkalemia: Secondary | ICD-10-CM

## 2023-10-28 HISTORY — PX: AMPUTATION TOE: SHX6595

## 2023-10-28 HISTORY — PX: ALLOGRAFT APPLICATION: SHX6404

## 2023-10-28 LAB — POCT I-STAT, CHEM 8
BUN: 31 mg/dL — ABNORMAL HIGH (ref 8–23)
Calcium, Ion: 1.19 mmol/L (ref 1.15–1.40)
Chloride: 105 mmol/L (ref 98–111)
Creatinine, Ser: 1 mg/dL (ref 0.44–1.00)
Glucose, Bld: 72 mg/dL (ref 70–99)
HCT: 40 % (ref 36.0–46.0)
Hemoglobin: 13.6 g/dL (ref 12.0–15.0)
Potassium: 4.4 mmol/L (ref 3.5–5.1)
Sodium: 135 mmol/L (ref 135–145)
TCO2: 21 mmol/L — ABNORMAL LOW (ref 22–32)

## 2023-10-28 LAB — GLUCOSE, CAPILLARY
Glucose-Capillary: 63 mg/dL — ABNORMAL LOW (ref 70–99)
Glucose-Capillary: 73 mg/dL (ref 70–99)
Glucose-Capillary: 74 mg/dL (ref 70–99)

## 2023-10-28 SURGERY — AMPUTATION, TOE
Anesthesia: General | Site: Toe | Laterality: Right

## 2023-10-28 MED ORDER — DEXTROSE 50 % IV SOLN
INTRAVENOUS | Status: AC
  Filled 2023-10-28: qty 50

## 2023-10-28 MED ORDER — LIDOCAINE HCL (PF) 2 % IJ SOLN
INTRAMUSCULAR | Status: AC
  Filled 2023-10-28: qty 5

## 2023-10-28 MED ORDER — PROPOFOL 10 MG/ML IV BOLUS
INTRAVENOUS | Status: AC
Start: 2023-10-28 — End: ?
  Filled 2023-10-28: qty 20

## 2023-10-28 MED ORDER — MIDAZOLAM HCL 2 MG/2ML IJ SOLN
INTRAMUSCULAR | Status: DC | PRN
  Administered 2023-10-28: 2 mg via INTRAVENOUS

## 2023-10-28 MED ORDER — ONDANSETRON HCL 4 MG/2ML IJ SOLN
INTRAMUSCULAR | Status: DC | PRN
  Administered 2023-10-28: 4 mg via INTRAVENOUS

## 2023-10-28 MED ORDER — BUPIVACAINE HCL 0.5 % IJ SOLN
INTRAMUSCULAR | Status: DC | PRN
  Administered 2023-10-28: 12 mL

## 2023-10-28 MED ORDER — EPHEDRINE SULFATE-NACL 50-0.9 MG/10ML-% IV SOSY
PREFILLED_SYRINGE | INTRAVENOUS | Status: DC | PRN
  Administered 2023-10-28: 10 mg via INTRAVENOUS

## 2023-10-28 MED ORDER — MIDAZOLAM HCL 2 MG/2ML IJ SOLN
INTRAMUSCULAR | Status: AC
  Filled 2023-10-28: qty 2

## 2023-10-28 MED ORDER — FENTANYL CITRATE (PF) 100 MCG/2ML IJ SOLN
INTRAMUSCULAR | Status: DC | PRN
Start: 2023-10-28 — End: 2023-10-28
  Administered 2023-10-28: 25 ug via INTRAVENOUS

## 2023-10-28 MED ORDER — CHLORHEXIDINE GLUCONATE 0.12 % MT SOLN
OROMUCOSAL | Status: AC
  Filled 2023-10-28: qty 15

## 2023-10-28 MED ORDER — PROPOFOL 500 MG/50ML IV EMUL
INTRAVENOUS | Status: DC | PRN
  Administered 2023-10-28: 150 ug/kg/min via INTRAVENOUS
  Administered 2023-10-28: 20 mg via INTRAVENOUS

## 2023-10-28 MED ORDER — DEXAMETHASONE SODIUM PHOSPHATE 10 MG/ML IJ SOLN
INTRAMUSCULAR | Status: AC
  Filled 2023-10-28: qty 1

## 2023-10-28 MED ORDER — BUPIVACAINE HCL (PF) 0.5 % IJ SOLN
INTRAMUSCULAR | Status: AC
  Filled 2023-10-28: qty 30

## 2023-10-28 MED ORDER — CEFAZOLIN SODIUM-DEXTROSE 2-4 GM/100ML-% IV SOLN
INTRAVENOUS | Status: AC
  Filled 2023-10-28: qty 100

## 2023-10-28 MED ORDER — DEXAMETHASONE SODIUM PHOSPHATE 10 MG/ML IJ SOLN
INTRAMUSCULAR | Status: DC | PRN
  Administered 2023-10-28: 5 mg via INTRAVENOUS

## 2023-10-28 MED ORDER — FENTANYL CITRATE (PF) 100 MCG/2ML IJ SOLN
INTRAMUSCULAR | Status: AC
  Filled 2023-10-28: qty 2

## 2023-10-28 MED ORDER — EPHEDRINE 5 MG/ML INJ
INTRAVENOUS | Status: AC
  Filled 2023-10-28: qty 5

## 2023-10-28 MED ORDER — 0.9 % SODIUM CHLORIDE (POUR BTL) OPTIME
TOPICAL | Status: DC | PRN
  Administered 2023-10-28: 200 mL

## 2023-10-28 MED ORDER — DEXTROSE 50 % IV SOLN
0.5000 | Freq: Once | INTRAVENOUS | Status: AC
Start: 2023-10-28 — End: 2023-10-28
  Administered 2023-10-28: 25 mL via INTRAVENOUS

## 2023-10-28 MED ORDER — DOXYCYCLINE HYCLATE 100 MG PO CAPS: 100.0000 mg | ORAL_CAPSULE | Freq: Two times a day (BID) | ORAL | 0 refills | Status: DC

## 2023-10-28 MED ORDER — SODIUM CHLORIDE 0.9 % IV SOLN: INTRAVENOUS | Status: DC

## 2023-10-28 MED ORDER — PHENYLEPHRINE 80 MCG/ML (10ML) SYRINGE FOR IV PUSH (FOR BLOOD PRESSURE SUPPORT)
PREFILLED_SYRINGE | INTRAVENOUS | Status: DC | PRN
  Administered 2023-10-28 (×4): 80 ug via INTRAVENOUS

## 2023-10-28 MED ORDER — ONDANSETRON HCL 4 MG/2ML IJ SOLN
INTRAMUSCULAR | Status: AC
  Filled 2023-10-28: qty 2

## 2023-10-28 MED ORDER — CEFAZOLIN SODIUM-DEXTROSE 2-4 GM/100ML-% IV SOLN
2.0000 g | INTRAVENOUS | Status: AC
  Administered 2023-10-28: 2 g via INTRAVENOUS

## 2023-10-28 MED ORDER — PHENYLEPHRINE 80 MCG/ML (10ML) SYRINGE FOR IV PUSH (FOR BLOOD PRESSURE SUPPORT)
PREFILLED_SYRINGE | INTRAVENOUS | Status: AC
  Filled 2023-10-28: qty 10

## 2023-10-28 MED ORDER — CHLORHEXIDINE GLUCONATE 0.12 % MT SOLN
15.0000 mL | Freq: Once | OROMUCOSAL | Status: AC
  Administered 2023-10-28: 15 mL via OROMUCOSAL

## 2023-10-28 MED ORDER — LIDOCAINE HCL (CARDIAC) PF 100 MG/5ML IV SOSY
PREFILLED_SYRINGE | INTRAVENOUS | Status: DC | PRN
  Administered 2023-10-28: 100 mg via INTRAVENOUS

## 2023-10-28 MED ORDER — ORAL CARE MOUTH RINSE: 15.0000 mL | Freq: Once | OROMUCOSAL | Status: AC

## 2023-10-28 SURGICAL SUPPLY — 37 items
BLADE MED AGGRESSIVE (BLADE) IMPLANT
BLADE OSC/SAGITTAL MD 5.5X18 (BLADE) IMPLANT
BLADE SURG 15 STRL LF DISP TIS (BLADE) IMPLANT
BLADE SURG MINI STRL (BLADE) IMPLANT
BNDG ESMARCH 4X12 STRL LF (GAUZE/BANDAGES/DRESSINGS) ×3 IMPLANT
BNDG GAUZE DERMACEA FLUFF 4 (GAUZE/BANDAGES/DRESSINGS) ×3 IMPLANT
CNTNR URN SCR LID CUP LEK RST (MISCELLANEOUS) IMPLANT
CUFF TOURN SGL QUICK 12 (TOURNIQUET CUFF) IMPLANT
CUFF TOURN SGL QUICK 18X4 (TOURNIQUET CUFF) IMPLANT
ELECT REM PT RETURN 9FT ADLT (ELECTROSURGICAL) ×2 IMPLANT
ELECTRODE REM PT RTRN 9FT ADLT (ELECTROSURGICAL) ×3 IMPLANT
GAUZE SPONGE 4X4 12PLY STRL (GAUZE/BANDAGES/DRESSINGS) ×6 IMPLANT
GLOVE BIOGEL M 7.0 STRL (GLOVE) ×3 IMPLANT
GLOVE BIOGEL PI IND STRL 7.5 (GLOVE) ×3 IMPLANT
GOWN STRL REUS W/ TWL LRG LVL3 (GOWN DISPOSABLE) ×6 IMPLANT
GRAFT SKIN WND SURGIBIND 3X7 (Tissue) IMPLANT
KIT TURNOVER KIT A (KITS) ×3 IMPLANT
LABEL OR SOLS (LABEL) ×3 IMPLANT
MANIFOLD NEPTUNE II (INSTRUMENTS) ×3 IMPLANT
NDL BIOPSY JAMSHIDI 11X6 (NEEDLE) IMPLANT
NDL HYPO 25X1 1.5 SAFETY (NEEDLE) ×6 IMPLANT
NEEDLE BIOPSY JAMSHIDI 11X6 (NEEDLE) IMPLANT
NEEDLE HYPO 25X1 1.5 SAFETY (NEEDLE) ×4 IMPLANT
NS IRRIG 500ML POUR BTL (IV SOLUTION) ×3 IMPLANT
PACK EXTREMITY ARMC (MISCELLANEOUS) ×3 IMPLANT
PAD ABD DERMACEA PRESS 5X9 (GAUZE/BANDAGES/DRESSINGS) IMPLANT
SOL PREP PVP 2OZ (MISCELLANEOUS) ×2 IMPLANT
SOLUTION PREP PVP 2OZ (MISCELLANEOUS) ×3 IMPLANT
SUT ETHILON 3-0 FS-10 30 BLK (SUTURE) ×2 IMPLANT
SUT ETHILON 4-0 FS2 18XMFL BLK (SUTURE) ×2 IMPLANT
SUT MNCRL AB 3-0 PS2 27 (SUTURE) IMPLANT
SUTURE EHLN 3-0 FS-10 30 BLK (SUTURE) ×3 IMPLANT
SUTURE ETHLN 4-0 FS2 18XMF BLK (SUTURE) IMPLANT
SWAB CULTURE AMIES ANAERIB BLU (MISCELLANEOUS) IMPLANT
SYR 10ML LL (SYRINGE) ×3 IMPLANT
TRAP FLUID SMOKE EVACUATOR (MISCELLANEOUS) ×3 IMPLANT
WATER STERILE IRR 500ML POUR (IV SOLUTION) ×3 IMPLANT
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
## 2023-10-28 NOTE — Brief Op Note (Signed)
 10/28/2023  3:26 PM  PATIENT:  [name]  73 y.o. female  PRE-OPERATIVE DIAGNOSIS:  OSTEOMYELITIS OF RIGHT THIRD TOE  POST-OPERATIVE DIAGNOSIS:  OSTEOMYELITIS OF RIGHT THIRD TOE  PROCEDURE:  Procedure(s): AMPUTATION, TOE (Right) APPLICATION, ALLOGRAFT, SKIN (Right)  SURGEON:  Surgeons and Role:    * Edwin Cap, DPM - Primary  PHYSICIAN ASSISTANT:   ASSISTANTS: none   ANESTHESIA:   local and MAC  EBL:  5 mL   BLOOD ADMINISTERED:none  DRAINS: none   LOCAL MEDICATIONS USED:  BUPIVICAINE  and Amount: 12 ml  SPECIMEN:  third toe right  DISPOSITION OF SPECIMEN:   path and micro  COUNTS:  YES  TOURNIQUET:  * No tourniquets in log *  DICTATION: .Note written in EPIC  PLAN OF CARE: Discharge to home after PACU  PATIENT DISPOSITION:  PACU - hemodynamically stable.   Delay start of Pharmacological VTE agent (>24hrs) due to surgical blood loss or risk of bleeding: no

## 2023-10-28 NOTE — Anesthesia Postprocedure Evaluation (Signed)
 Anesthesia Post Note  Patient: [name]  Procedure(s) Performed: AMPUTATION, TOE (Right: Toe) APPLICATION, ALLOGRAFT, SKIN (Right)  Patient location during evaluation: PACU Anesthesia Type: General Level of consciousness: awake and alert Pain management: pain level controlled Vital Signs Assessment: post-procedure vital signs reviewed and stable Respiratory status: spontaneous breathing, nonlabored ventilation, respiratory function stable and patient connected to nasal cannula oxygen Cardiovascular status: blood pressure returned to baseline and stable Postop Assessment: no apparent nausea or vomiting Anesthetic complications: no   No notable events documented.   Last Vitals:  Vitals:   10/28/23 1619 10/28/23 1636  BP: (!) 174/67 (!) 174/68  Pulse: 62 62  Resp: 17 16  Temp:    SpO2: 100% 100%    Last Pain:  Vitals:   10/28/23 1636  TempSrc:   PainSc: 0-No pain                 Cleda Mccreedy Jonmarc Bodkin

## 2023-10-28 NOTE — Discharge Instructions (Addendum)
 Post-Surgery Instructions  1. If you are recuperating from surgery anywhere other than home, please be sure to leave [place] a number where you can be reached. 2. Go directly home and rest. 3. The keep operated foot (or feet) elevated six inches above the hip when sitting or lying down. 4. Support the elevated foot and leg with pillows under the calf. DO NOT PLACE PILLOWS UNDER THE KNEE. 5. DO NOT REMOVE or get your bandages wet. This will increase your chances of getting an infection. 6. Wear your surgical shoe at all times when you are up. 7. A limited amount of pain and swelling may occur. The skin may take on a bruised appearance. This is no cause for alarm. 8. For slight pain and swelling, apply an ice pack directly over the bandage for 15 minutes every hour. Continue icing until seen in the office. DO NOT apply any form of heat to the area. 9. Have prescription(s) filled immediately and take as directed. 10. Drink lots of liquids, water, and juice. 11. CALL THE OFFICE IMMEDIATELY IF: a. Bleeding continues b. Pain increases and/or does not respond to medication c. Bandage or cast appears too tight d. Any liquids (water, coffee, etc.) have spilled on your bandages. e. Tripping, falling, or stubbing the surgical foot f. If your temperature rises above 101 g. If you have ANY questions at all 12. Please use the crutches, knee scooter, or walker you have prescribed, rented, or purchased. If you are non-weight bearing DO NOT put weight on the operated foot for _________ days. If you are weight-bearing, follow your physician's instructions. You are expected to be:   ? non-weight bearing 13. Special Instructions: Change the dressing on Sunday, 10/30/2023, apply the surgical lubricant to the nonadherent gauze that is stapled on the heel.  Apply Betadine to the toe stitches..  Dress it with gauze, gauze roll and Ace wrap.  Change it every day until you see me again.  14. Your next appointment  is: 11/03/2023 9:15 AM   If you need to reach the nurse for any reason, please call: [place]: [phone] [place]: [phone] [place]: [phone]

## 2023-10-28 NOTE — Transfer of Care (Signed)
 Immediate Anesthesia Transfer of Care Note  Patient: [name]  Procedure(s) Performed: AMPUTATION, TOE (Right: Toe) APPLICATION, ALLOGRAFT, SKIN (Right)  Patient Location: PACU  Anesthesia Type:General  Level of Consciousness: drowsy  Airway & Oxygen Therapy: Patient Spontanous Breathing and Patient connected to face mask oxygen  Post-op Assessment: Report given to RN and Post -op Vital signs reviewed and stable  Post vital signs: Reviewed and stable  Last Vitals:  Vitals Value Taken Time  BP 112/44 10/28/23 1530  Temp 35.9 1530  Pulse 57 10/28/23 1532  Resp 10 10/28/23 1532  SpO2 100 % 10/28/23 1532  Vitals shown include unfiled device data.  Last Pain:  Vitals:   10/28/23 1226  TempSrc: Tympanic  PainSc: 0-No pain         Complications: No notable events documented.

## 2023-10-29 DIAGNOSIS — M86171 Other acute osteomyelitis, right ankle and foot: Secondary | ICD-10-CM

## 2023-10-29 DIAGNOSIS — E08621 Diabetes mellitus due to underlying condition with foot ulcer: Secondary | ICD-10-CM

## 2023-10-29 NOTE — Op Note (Signed)
 Patient Name: [name] DOB: 1951/08/01  MRN: [id number]   Date of Service: 10/28/2023  Surgeon: Dr. Sharl Ma, DPM Assistants: None Pre-operative Diagnosis:  OSTEOMYELITIS OF RIGHT THIRD TOE Diabetic ulcer right heel fat layer exposed Post-operative Diagnosis:  OSTEOMYELITIS OF RIGHT THIRD TOE Diabetic ulcer right heel fat layer exposed Procedures: Amputation right third toe MTP joint Preparation of wound bed of diabetic ulcer of right heel 9 cm Application skin substitute diabetic ulcer right heel lines were centimeters Pathology/Specimens: ID Type Source Tests Collected by Time Destination  1 : Right 3rd toe Tissue Foot, Right SURGICAL PATHOLOGY Edwin Cap, DPM 10/28/2023 1508   A : Right 3rd toe culture Tissue Foot, Right AEROBIC/ANAEROBIC CULTURE W GRAM STAIN (SURGICAL/DEEP WOUND) Edwin Cap, DPM 10/28/2023 1510    Anesthesia: MAC with local Hemostasis: * No tourniquets in log * Estimated Blood Loss: 5 mL Materials:  Implant Name Type Inv. Item Serial No. Manufacturer Lot No. LRB No. Used Action  GRAFT SKIN WND SURGIBIND 3X7 - [id number] Tissue GRAFT SKIN WND SURGIBIND 3X7  [employer] [id number] Right 1 Implanted   Medications: 12 cc 0.5% bupivacaine plain Complications: No complication noted  Indications for Procedure:  This is a 73 y.o. female with a history of type 2 diabetes with polyneuropathy and PAD who developed a new ulceration on her right third toe, preoperative radiographs showed osteomyelitis of the distal phalanx that she underwent MRI that confirmed the diagnosis.  Amputation was recommended.  She also developed a plantar right heel ulcer and debridement and skin substitute application was recommended. All risks, benefits and potential complications discussed prior to the procedure. All questions addressed. Informed consent signed and reviewed.      Procedure in Detail: Patient was identified in pre-operative holding area. Formal consent  was signed and the right lower extremity was marked. Patient was brought back to the operating room. Anesthesia was induced. The extremity was prepped and draped in the usual sterile fashion. Timeout was taken to confirm patient name, laterality, and procedure prior to incision.   Attention was then directed to the right third toe where an incision was made in a racquet shaped incision around the toe. Dissection was carried down to level of bone.  Dissection was continued to the metatarsophalangeal joint and all collateral ligaments were freed at the joint.  The bone soft tissue attachments of the proximal phalanx were removed and passed for pathology.  Bone culture was taken from the distal phalanx.  The remaining metatarsal head appeared healthy and viable.  The area was copiously irrigated.  The skin was reapproximated with Monocryl and nylon.  Attention was then directed to the right lower plantar heel.  The wound was sharply debrided of all nonviable tissue hyperkeratosis and slough with a scalpel and curette in an excisional manner to the subcutaneous layer.  Following preparation of the wound bed measured 3 x 3 x 0.5 cm.  Hemostasis was achieved with cautery.  Kerecis skin substitute was then applied into the wound bed and inset and moistened with saline.  The graft was affixed to the wound bed with Adaptic and skin staples.  Approximately 9 units of the Kerecis was used and 12 was wasted.  Surgical lubricant was applied over this to maintain a moist wound healing environment and secondary dressings applied including 4 x 4 gauze ABD pad Kerlix and an Ace wrap under light compression. Patient tolerated the procedure well.   The foot was then dressed with Xeroform and dry sterile  dressings. Patient tolerated the procedure well.   Disposition: Following a period of post-operative monitoring, patient will be transferred to home.  She will need angiography of her right lower extremity.  Perfusion of the  heel ulcer was adequate, less than adequate for the toe amputation site.  Vascular surgery indicated that they will plan for this as an outpatient in the next 1 to 2 weeks
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
## 2023-10-30 NOTE — Interval H&P Note (Signed)
 History and Physical Interval Note:  10/30/2023 9:51 PM  [name]  has presented today for surgery, with the diagnosis of OSTEOMYELITIS OF RIGHT THIRD TOE.  The various methods of treatment have been discussed with the patient and family. After consideration of risks, benefits and other options for treatment, the patient has consented to  Procedure(s): AMPUTATION, TOE (Right) APPLICATION, ALLOGRAFT, SKIN (Right) as a surgical intervention.  The patient's history has been reviewed, patient examined, no change in status, stable for surgery.  I have reviewed the patient's chart and labs.  Questions were answered to the patient's satisfaction.     Edwin Cap
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
## 2023-10-31 ENCOUNTER — Encounter: Payer: Self-pay | Admitting: Podiatry

## 2023-10-31 LAB — VAS US ABI WITH/WO TBI
Left ABI: 0.99
Right ABI: 1.08

## 2023-11-01 ENCOUNTER — Telehealth (INDEPENDENT_AMBULATORY_CARE_PROVIDER_SITE_OTHER): Payer: Self-pay

## 2023-11-01 ENCOUNTER — Ambulatory Visit: Payer: Medicare Other | Admitting: Cardiovascular Disease

## 2023-11-01 LAB — SURGICAL PATHOLOGY

## 2023-11-01 NOTE — Telephone Encounter (Signed)
 Spoke with the patient and she is scheduled with Dr. [name] for a RLE angio on 11/08/23 with a 11:00 am arrival time to the [facility]. Pre-procedure instructions were discussed and will be sent to Mychart. Patient stated she wrote down the information as well. Spoke with Megan to schedule anesthesia.

## 2023-11-02 LAB — AEROBIC/ANAEROBIC CULTURE W GRAM STAIN (SURGICAL/DEEP WOUND)

## 2023-11-03 ENCOUNTER — Other Ambulatory Visit: Payer: Self-pay | Admitting: Podiatry

## 2023-11-03 ENCOUNTER — Ambulatory Visit (INDEPENDENT_AMBULATORY_CARE_PROVIDER_SITE_OTHER): Admitting: Podiatry

## 2023-11-03 ENCOUNTER — Encounter: Admitting: Podiatry

## 2023-11-03 ENCOUNTER — Ambulatory Visit (INDEPENDENT_AMBULATORY_CARE_PROVIDER_SITE_OTHER)

## 2023-11-03 DIAGNOSIS — E11621 Type 2 diabetes mellitus with foot ulcer: Secondary | ICD-10-CM

## 2023-11-03 DIAGNOSIS — I739 Peripheral vascular disease, unspecified: Secondary | ICD-10-CM | POA: Diagnosis not present

## 2023-11-03 DIAGNOSIS — L97412 Non-pressure chronic ulcer of right heel and midfoot with fat layer exposed: Secondary | ICD-10-CM

## 2023-11-03 DIAGNOSIS — Z89421 Acquired absence of other right toe(s): Secondary | ICD-10-CM

## 2023-11-03 NOTE — Progress Notes (Signed)
 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 female returns for post-op check.  Patient states that she is doing okay denies any other acute complaints.  Healing well.  Review of Systems: Negative except as noted in the HPI. Denies N/V/F/Ch.  Past Medical History:  Diagnosis Date   Anemia    Anginal pain (HCC)    Anxiety    Atrial fibrillation (HCC)    Cellulitis    CHF (congestive heart failure) (HCC)    Chronic osteomyelitis of foot (HCC)    left   Chronic osteomyelitis of foot (HCC)    Complication of anesthesia    difficult to induce sleep   Coronary artery disease involving native coronary artery of native heart    Status post coronary stenting approximately 10 years ago   Coronary stent occlusion    Depression    Diabetic foot infection (HCC)    Dysrhythmia 2019   atrial fibrillation   Hx of CABG    Hyperlipidemia    Hypertension    Osteoarthritis    Peripheral artery disease (HCC)    Pneumonia    Pulmonary embolism (HCC) 01/08/2015   s/p cabg   Rotator cuff injury    Senile nuclear sclerosis, bilateral    Stress incontinence    Type 2 diabetes mellitus with peripheral angiopathy (HCC)     Current Outpatient Medications:    amLODipine (NORVASC) 10 MG tablet, Take 1 tablet (10 mg total) by mouth 2 (two) times daily., Disp: 30 tablet, Rfl: 1   aspirin EC 81 MG tablet, Take 81 mg by mouth daily. Swallow whole., Disp: , Rfl:    atenolol (TENORMIN) 50 MG tablet, Take 50 mg by mouth every morning., Disp: , Rfl:    CINNAMON PO, Take 1,000 mg by mouth 3 (three) times daily with meals., Disp: , Rfl:    clopidogrel (PLAVIX) 75 MG tablet, TAKE ONE TABLET BY MOUTH ONE TIME  DAILY (Patient taking differently: Take 75 mg by mouth at bedtime.), Disp: 30 tablet, Rfl: 2   diphenoxylate-atropine (LOMOTIL) 2.5-0.025 MG tablet, Take 1 tablet by mouth 4 (four) times daily as needed for diarrhea or loose stools., Disp: , Rfl:    doxycycline (VIBRAMYCIN) 100 MG capsule, Take 1 capsule (100 mg total) by mouth 2 (two) times daily., Disp: 28 capsule, Rfl: 0   gentamicin cream (GARAMYCIN) 0.1 %, Apply 1 Application topically daily., Disp: 30 g, Rfl: 0   GLIPIZIDE XL 10 MG 24 hr tablet, Take 10 mg by mouth daily with breakfast. , Disp: , Rfl:    guaiFENesin (MUCINEX) 600 MG 12 hr tablet, Take 600 mg by mouth every morning., Disp: , Rfl:    QUEtiapine (SEROQUEL) 50 MG tablet, Take 150 mg by mouth at bedtime., Disp: , Rfl:    rosuvastatin (CRESTOR) 40 MG tablet, Take 40 mg by mouth at bedtime., Disp: , Rfl:    tirzepatide (MOUNJARO) 2.5 MG/0.5ML Pen, Inject 7.4 mg into the skin once a week. Friday, Disp: , Rfl:    tirzepatide (MOUNJARO) 7.5 MG/0.5ML Pen, Inject 7.5 mg into the skin once a week. (Patient not taking: Reported on 10/28/2023), Disp: , Rfl:   Social History   Tobacco  Use  Smoking Status Never  Smokeless Tobacco Never    Allergies  Allergen Reactions   Metformin And Related Diarrhea   Oxycodone Hcl Nausea And Vomiting    Can still take this for pain medicine   Altace [Ramipril] Cough   Amaryl [Glimepiride]    Atorvastatin Cough   Biaxin [Clarithromycin] Itching   Codeine Nausea Only and Other (See Comments)    Constipates; doesn't like the way it makes her feel    Hydrocodone-Acetaminophen Nausea And Vomiting   Isosorbide Itching and Cough   Losartan Itching and Cough   Monopril [Fosinopril]    Other Cough   Propoxyphene     DARVOCET (Propoxyphene-ACETAMINOPHEN)   Tramadol Nausea Only   Zetia [Ezetimibe] Diarrhea   Objective:  There were no vitals filed for this visit. There is no height or weight on file to calculate BMI. Constitutional Well  developed. Well nourished.  Vascular Foot warm and well perfused. Capillary refill normal to all digits.   Neurologic Normal speech. Oriented to person, place, and time. Epicritic sensation to light touch grossly present bilaterally.  Dermatologic Skin healing well without signs of infection. Skin edges well coapted without signs of infection.  Orthopedic: Tenderness to palpation noted about the surgical site.   Radiographs: None Assessment:   1. PAD (peripheral artery disease) (HCC)    Plan:  Patient was evaluated and treated and all questions answered.  S/p foot surgery right -Progressing as expected post-operatively. -XR: See above -WB Status: Nonweightbearing in the right foot and crutches -Sutures: Intact.  No clinical signs of Deis is noted no complication noted. -Medications: None -Right heel wound is healing well.  Graft is intact no signs of failing noted.  Appears to be incorporating. -Patient will see Dr. Lilian Kapur back again in 10 to 14 days for suture removal and graft maintenance  No follow-ups on file.

## 2023-11-08 ENCOUNTER — Encounter (INDEPENDENT_AMBULATORY_CARE_PROVIDER_SITE_OTHER): Payer: Self-pay | Admitting: Vascular Surgery

## 2023-11-08 ENCOUNTER — Ambulatory Visit
Admission: RE | Admit: 2023-11-08 | Discharge: 2023-11-08 | Disposition: A | Discharge status: Home / Self Care | Attending: Vascular Surgery | Admitting: Vascular Surgery

## 2023-11-08 ENCOUNTER — Ambulatory Visit: Admitting: Anesthesiology

## 2023-11-08 ENCOUNTER — Encounter
Admission: RE | Disposition: A | Discharge status: Home / Self Care | Payer: Self-pay | Source: Home / Self Care | Attending: Vascular Surgery

## 2023-11-08 ENCOUNTER — Encounter: Payer: Self-pay | Admitting: Vascular Surgery

## 2023-11-08 ENCOUNTER — Other Ambulatory Visit: Payer: Self-pay

## 2023-11-08 DIAGNOSIS — Z7985 Long-term (current) use of injectable non-insulin antidiabetic drugs: Secondary | ICD-10-CM | POA: Diagnosis not present

## 2023-11-08 DIAGNOSIS — L97519 Non-pressure chronic ulcer of other part of right foot with unspecified severity: Secondary | ICD-10-CM | POA: Insufficient documentation

## 2023-11-08 DIAGNOSIS — L97419 Non-pressure chronic ulcer of right heel and midfoot with unspecified severity: Secondary | ICD-10-CM

## 2023-11-08 DIAGNOSIS — I4891 Unspecified atrial fibrillation: Secondary | ICD-10-CM | POA: Diagnosis not present

## 2023-11-08 DIAGNOSIS — I110 Hypertensive heart disease with heart failure: Secondary | ICD-10-CM | POA: Diagnosis not present

## 2023-11-08 DIAGNOSIS — Z7984 Long term (current) use of oral hypoglycemic drugs: Secondary | ICD-10-CM | POA: Insufficient documentation

## 2023-11-08 DIAGNOSIS — I70235 Atherosclerosis of native arteries of right leg with ulceration of other part of foot: Secondary | ICD-10-CM | POA: Diagnosis not present

## 2023-11-08 DIAGNOSIS — I70299 Other atherosclerosis of native arteries of extremities, unspecified extremity: Secondary | ICD-10-CM

## 2023-11-08 DIAGNOSIS — E11621 Type 2 diabetes mellitus with foot ulcer: Secondary | ICD-10-CM | POA: Insufficient documentation

## 2023-11-08 DIAGNOSIS — E1151 Type 2 diabetes mellitus with diabetic peripheral angiopathy without gangrene: Secondary | ICD-10-CM | POA: Diagnosis present

## 2023-11-08 DIAGNOSIS — Z7902 Long term (current) use of antithrombotics/antiplatelets: Secondary | ICD-10-CM | POA: Insufficient documentation

## 2023-11-08 DIAGNOSIS — I509 Heart failure, unspecified: Secondary | ICD-10-CM | POA: Diagnosis not present

## 2023-11-08 DIAGNOSIS — I2510 Atherosclerotic heart disease of native coronary artery without angina pectoris: Secondary | ICD-10-CM | POA: Insufficient documentation

## 2023-11-08 DIAGNOSIS — Z955 Presence of coronary angioplasty implant and graft: Secondary | ICD-10-CM | POA: Diagnosis not present

## 2023-11-08 DIAGNOSIS — Z9889 Other specified postprocedural states: Secondary | ICD-10-CM

## 2023-11-08 DIAGNOSIS — Z79899 Other long term (current) drug therapy: Secondary | ICD-10-CM | POA: Insufficient documentation

## 2023-11-08 HISTORY — PX: LOWER EXTREMITY ANGIOGRAPHY: CATH118251

## 2023-11-08 LAB — BUN: BUN: 34 mg/dL — ABNORMAL HIGH (ref 8–23)

## 2023-11-08 LAB — CREATININE, SERUM
Creatinine, Ser: 1.02 mg/dL — ABNORMAL HIGH (ref 0.44–1.00)
GFR, Estimated: 58 mL/min — ABNORMAL LOW (ref >60)

## 2023-11-08 LAB — GLUCOSE, CAPILLARY
Glucose-Capillary: 79 mg/dL (ref 70–99)
Glucose-Capillary: 109 mg/dL — ABNORMAL HIGH (ref 70–99)
Glucose-Capillary: 32 mg/dL — CL (ref 70–99)

## 2023-11-08 SURGERY — LOWER EXTREMITY ANGIOGRAPHY
Anesthesia: General | Site: Leg Lower | Laterality: Right

## 2023-11-08 MED ORDER — HEPARIN (PORCINE) IN NACL 1000-0.9 UT/500ML-% IV SOLN
INTRAVENOUS | Status: DC | PRN
  Administered 2023-11-08: 1000 mL

## 2023-11-08 MED ORDER — DEXTROSE 50 % IV SOLN
1.0000 | Freq: Once | INTRAVENOUS | Status: AC
  Administered 2023-11-08: 50 mL via INTRAVENOUS

## 2023-11-08 MED ORDER — PROPOFOL 10 MG/ML IV BOLUS
INTRAVENOUS | Status: AC
  Filled 2023-11-08: qty 40

## 2023-11-08 MED ORDER — DIPHENHYDRAMINE HCL 50 MG/ML IJ SOLN: 50.0000 mg | Freq: Once | INTRAMUSCULAR | Status: DC | PRN

## 2023-11-08 MED ORDER — PROPOFOL 10 MG/ML IV BOLUS
INTRAVENOUS | Status: AC
  Filled 2023-11-08: qty 20

## 2023-11-08 MED ORDER — FAMOTIDINE 20 MG PO TABS: 40.0000 mg | ORAL_TABLET | Freq: Once | ORAL | Status: DC | PRN

## 2023-11-08 MED ORDER — ONDANSETRON HCL 4 MG/2ML IJ SOLN
INTRAMUSCULAR | Status: DC | PRN
  Administered 2023-11-08: 4 mg via INTRAVENOUS

## 2023-11-08 MED ORDER — PROPOFOL 500 MG/50ML IV EMUL
INTRAVENOUS | Status: DC | PRN
  Administered 2023-11-08: 30 mg via INTRAVENOUS
  Administered 2023-11-08: 160 ug/kg/min via INTRAVENOUS

## 2023-11-08 MED ORDER — LIDOCAINE HCL (PF) 1 % IJ SOLN
INTRAMUSCULAR | Status: DC | PRN
Start: 2023-11-08 — End: 2023-11-08
  Administered 2023-11-08: 10 mL via INTRADERMAL

## 2023-11-08 MED ORDER — MIDAZOLAM HCL 2 MG/ML PO SYRP: 8.0000 mg | ORAL_SOLUTION | Freq: Once | ORAL | Status: DC | PRN

## 2023-11-08 MED ORDER — ONDANSETRON HCL 4 MG/2ML IJ SOLN
INTRAMUSCULAR | Status: AC
  Filled 2023-11-08: qty 2

## 2023-11-08 MED ORDER — DEXTROSE 50 % IV SOLN
INTRAVENOUS | Status: AC
  Filled 2023-11-08: qty 50

## 2023-11-08 MED ORDER — LIDOCAINE HCL (PF) 2 % IJ SOLN
INTRAMUSCULAR | Status: AC
  Filled 2023-11-08: qty 5

## 2023-11-08 MED ORDER — CEFAZOLIN SODIUM-DEXTROSE 2-4 GM/100ML-% IV SOLN
INTRAVENOUS | Status: AC
  Filled 2023-11-08: qty 100

## 2023-11-08 MED ORDER — NITROGLYCERIN 1 MG/10 ML FOR IR/CATH LAB
INTRA_ARTERIAL | Status: AC
  Filled 2023-11-08: qty 10

## 2023-11-08 MED ORDER — EPHEDRINE SULFATE-NACL 50-0.9 MG/10ML-% IV SOSY
PREFILLED_SYRINGE | INTRAVENOUS | Status: DC | PRN
  Administered 2023-11-08: 5 mg via INTRAVENOUS
  Administered 2023-11-08: 10 mg via INTRAVENOUS

## 2023-11-08 MED ORDER — MIDAZOLAM HCL 2 MG/2ML IJ SOLN
INTRAMUSCULAR | Status: AC
  Filled 2023-11-08: qty 2

## 2023-11-08 MED ORDER — FENTANYL CITRATE (PF) 100 MCG/2ML IJ SOLN: 25.0000 ug | INTRAMUSCULAR | Status: DC | PRN

## 2023-11-08 MED ORDER — CLOPIDOGREL BISULFATE 75 MG PO TABS: 75.0000 mg | ORAL_TABLET | Freq: Every day | ORAL | 11 refills | Status: AC

## 2023-11-08 MED ORDER — HYDROMORPHONE HCL 1 MG/ML IJ SOLN: 1.0000 mg | Freq: Once | INTRAMUSCULAR | Status: DC | PRN

## 2023-11-08 MED ORDER — LIDOCAINE HCL (CARDIAC) PF 100 MG/5ML IV SOSY
PREFILLED_SYRINGE | INTRAVENOUS | Status: DC | PRN
  Administered 2023-11-08: 100 mg via INTRAVENOUS

## 2023-11-08 MED ORDER — HEPARIN SODIUM (PORCINE) 1000 UNIT/ML IJ SOLN
INTRAMUSCULAR | Status: AC
  Filled 2023-11-08: qty 10

## 2023-11-08 MED ORDER — MIDAZOLAM HCL 2 MG/2ML IJ SOLN
INTRAMUSCULAR | Status: DC | PRN
  Administered 2023-11-08: 2 mg via INTRAVENOUS

## 2023-11-08 MED ORDER — ONDANSETRON HCL 4 MG/2ML IJ SOLN: 4.0000 mg | Freq: Four times a day (QID) | INTRAMUSCULAR | Status: DC | PRN

## 2023-11-08 MED ORDER — SODIUM CHLORIDE 0.9 % IV SOLN: INTRAVENOUS | Status: DC

## 2023-11-08 MED ORDER — PHENYLEPHRINE HCL-NACL 20-0.9 MG/250ML-% IV SOLN
INTRAVENOUS | Status: AC
  Filled 2023-11-08: qty 250

## 2023-11-08 MED ORDER — PHENYLEPHRINE 80 MCG/ML (10ML) SYRINGE FOR IV PUSH (FOR BLOOD PRESSURE SUPPORT)
PREFILLED_SYRINGE | INTRAVENOUS | Status: DC | PRN
  Administered 2023-11-08 (×6): 80 ug via INTRAVENOUS

## 2023-11-08 MED ORDER — CEFAZOLIN SODIUM-DEXTROSE 2-4 GM/100ML-% IV SOLN
2.0000 g | INTRAVENOUS | Status: AC
  Administered 2023-11-08: 2 g via INTRAVENOUS

## 2023-11-08 MED ORDER — IODIXANOL 320 MG/ML IV SOLN
INTRAVENOUS | Status: DC | PRN
  Administered 2023-11-08: 65 mL via INTRA_ARTERIAL

## 2023-11-08 MED ORDER — METHYLPREDNISOLONE SODIUM SUCC 125 MG IJ SOLR: 125.0000 mg | Freq: Once | INTRAMUSCULAR | Status: DC | PRN

## 2023-11-08 MED ORDER — HEPARIN SODIUM (PORCINE) 1000 UNIT/ML IJ SOLN
INTRAMUSCULAR | Status: DC | PRN
  Administered 2023-11-08: 6000 [IU] via INTRAVENOUS

## 2023-11-08 SURGICAL SUPPLY — 28 items
BALLN LUTONIX 018 4X150X130 (BALLOONS) ×1 IMPLANT
BALLN LUTONIX 018 5X300X130 (BALLOONS) ×1 IMPLANT
BALLN LUTONIX 4X150X130 (BALLOONS) ×1 IMPLANT
BALLN LUTONIX DCB 5X80X130 (BALLOONS) ×1 IMPLANT
BALLOON LUTONIX 018 4X150X130 (BALLOONS) IMPLANT
BALLOON LUTONIX 018 5X300X130 (BALLOONS) IMPLANT
BALLOON LUTONIX 4X150X130 (BALLOONS) IMPLANT
BALLOON LUTONIX DCB 5X80X130 (BALLOONS) IMPLANT
CATH ANGIO 5F PIGTAIL 65CM (CATHETERS) IMPLANT
CATH AURYON ATHERECTOMY 1.5 (CATHETERS) IMPLANT
CATH CXI SUPP 2.6F 150 ANG (CATHETERS) IMPLANT
CATH VERT 5FR 125CM (CATHETERS) IMPLANT
COVER PROBE ULTRASOUND 5X96 (MISCELLANEOUS) IMPLANT
DEVICE PRESTO INFLATION (MISCELLANEOUS) IMPLANT
DEVICE STARCLOSE SE CLOSURE (Vascular Products) IMPLANT
GLIDEWIRE ADV .035X260CM (WIRE) IMPLANT
GOWN STRL REUS W/ TWL LRG LVL3 (GOWN DISPOSABLE) ×2 IMPLANT
NDL ENTRY 21GA 7CM ECHOTIP (NEEDLE) IMPLANT
NEEDLE ENTRY 21GA 7CM ECHOTIP (NEEDLE) ×1 IMPLANT
PACK ANGIOGRAPHY (CUSTOM PROCEDURE TRAY) ×2 IMPLANT
SET INTRO CAPELLA COAXIAL (SET/KITS/TRAYS/PACK) IMPLANT
SHEATH BRITE TIP 5FRX11 (SHEATH) IMPLANT
SHEATH RAABE 6FRX70 (SHEATH) IMPLANT
STENT LIFESTENT 5F 6X80X135 (Permanent Stent) IMPLANT
SYR MEDRAD MARK 7 150ML (SYRINGE) IMPLANT
TUBING CONTRAST HIGH PRESS 72 (TUBING) IMPLANT
WIRE J 3MM .035X145CM (WIRE) IMPLANT
WIRE RUNTHROUGH .014X300CM (WIRE) IMPLANT

## 2023-11-08 NOTE — Progress Notes (Signed)
 Pt. OOB, ambulated to BR with crutches. Pt. Dressed herself. Left femoral site clean, dry, intact without complications at site. Waiting Dr. [name] to come speak with pt. And her husband.

## 2023-11-08 NOTE — Interval H&P Note (Signed)
 History and Physical Interval Note:  11/08/2023 11:30 AM  [name]  has presented today for surgery, with the diagnosis of RLE Angio   ASO w ulceration.  The various methods of treatment have been discussed with the patient and family. After consideration of risks, benefits and other options for treatment, the patient has consented to  Procedure(s): Lower Extremity Angiography (Right) as a surgical intervention.  The patient's history has been reviewed, patient examined, no change in status, stable for surgery.  I have reviewed the patient's chart and labs.  Questions were answered to the patient's satisfaction.     [name]

## 2023-11-08 NOTE — Progress Notes (Signed)
 Dr. [name] at bedside, speaking with pt.and her spouse re: procedural results. Both verbalized understanding of conversation.

## 2023-11-08 NOTE — Transfer of Care (Signed)
 Immediate Anesthesia Transfer of Care Note  Patient: [name]  Procedure(s) Performed: Lower Extremity Angiography (Right: Leg Lower)  Patient Location: PACU  Anesthesia Type:General  Level of Consciousness: drowsy  Airway & Oxygen Therapy: Patient Spontanous Breathing and Patient connected to face mask oxygen  Post-op Assessment: Report given to RN and Post -op Vital signs reviewed and stable  Post vital signs: Reviewed and stable  Last Vitals:  Vitals Value Taken Time  BP 145/59 11/08/23 1430  Temp 35.8 1430  Pulse 51 11/08/23 1436  Resp 13 11/08/23 1436  SpO2 100 % 11/08/23 1436  Vitals shown include unfiled device data.  Last Pain:  Vitals:   11/08/23 1135  TempSrc: Oral         Complications: No notable events documented.

## 2023-11-08 NOTE — Op Note (Signed)
 [facility]  Percutaneous Study/Intervention Procedural Note   Date of Surgery: 11/08/2023  Surgeon:  [name]  Pre-operative Diagnosis: Atherosclerotic occlusive disease bilateral lower extremities with right lower extremity with rest pain and ulceration.  Post-operative diagnosis:  Same  Procedure(s) Performed:             1.  Introduction catheter into right lower extremity 3rd order catheter placement               2.    Contrast injection right lower extremity for distal runoff             3.  Percutaneous transluminal angioplasty and stent placement right superficial femoral and popliteal arteries              4.  Laser atherectomy of the tibioperoneal trunk with percutaneous transluminal angioplasty to 4 mm             5.  Star close closure left common femoral arteriotomy  Anesthesia: General  Sheath: 6 [place] Rabie left common femoral retrograde  Contrast: 65 cc  Fluoroscopy Time: 16.9 minutes  Indications:  [name] presents with increasing pain of the right lower extremity.  This is associated with ulceration about the toes as well as the heel area.  This suggests the patient is having limb threatening ischemia. The risks and benefits are reviewed all questions answered patient agrees to proceed.  Procedure:   [name] is a 73 y.o. y.o. female who was identified and appropriate procedural time out was performed.  The patient was then placed supine on the table and prepped and draped in the usual sterile fashion.    Ultrasound was placed in the sterile sleeve and the left groin was evaluated the left common femoral artery was echolucent and pulsatile indicating patency.  Image was recorded for the permanent record and under real-time visualization a microneedle was inserted into the common femoral artery followed by the microwire and then the micro-sheath.  A J-wire was then advanced through the micro-sheath and a  5 [place] sheath  was then inserted over a J-wire. J-wire was then advanced and a 5 French pigtail catheter was positioned at the level of T12.  AP projection of the aorta was then obtained. Pigtail catheter was repositioned to above the bifurcation and a LAO view of the pelvis was obtained.  Subsequently a pigtail catheter with an Advantage wire was used to cross the aortic bifurcation.  The catheter and wire were advanced down into the right distal external iliac artery. Oblique view of the femoral bifurcation was then obtained and subsequently the wire was reintroduced and the pigtail catheter negotiated into the SFA representing third order catheter placement. Distal runoff was then performed.  6000 units of heparin was then given and allowed to circulate for several minutes.  A 6 French Rabie sheath was advanced up and over the bifurcation and positioned in the mid superficial femoral artery  KMP catheter and advantage Glidewire were then negotiated down into the distal popliteal.  A 6 mm x 80 mm life stent is then deployed across the lesion in the distal SFA proximal above-knee popliteal artery.  A 5 mm x 80 mm Lutonix drug-eluting balloon was used to angioplasty the stent which essentially is at Hunter's canal.  The inflation was for 1 minute at 10 atm. Follow-up imaging demonstrated patency with less than 10% residual stenosis throughout the stented segment.  More distally in the mid popliteal a  4 mm x 100 mm Lutonix drug-eluting balloon is used to treat this area inflation is to 10 atm for approximately 1 minute.  Follow-up imaging demonstrates improvement but several lesions remained undersized.  Nevertheless I elected to move forward with treatment of the tibial disease to create better outflow.  The detector was then positioned more distally.  A 0.014 run-through wire was negotiated down into the peroneal through the 90% stenosis of the tibioperoneal trunk.  The [place] 1.5 mm laser is then advanced over the wire and  positioned at the origin of the TP trunk.  A total of 3 passes are made the initial passes made at 50 pulses per minute and the next 2 passes were made at 60 pulses per minute.  Follow-up imaging demonstrates a marked improvement in luminal gain but there is still approximately 30% residual stenosis and a 4 mm Lutonix drug-eluting balloon is advanced and positioned with its tip in the distal tibioperoneal trunk.  Is then inflated to 6 atm for 2 full minutes.  Follow-up imaging demonstrates less than 10% residual stenosis in the TP trunk with preservation of the peroneal runoff.  As noted above several areas in the popliteal are still undersized and a 5 mm Lutonix drug-eluting balloon is advanced and positioned with its tip in the distal popliteal extending back into the previously placed stent.  The 5 mm Lutonix balloon was then inflated to 12 atm for approximately 2 minutes.  Follow-up imaging now beginning from the mid SFA and extending down to the foot is then performed.    After review of these images the sheath is pulled into the left external iliac oblique of the common femoral is obtained and a Star close device deployed. There no immediate Complications.  Findings:  The abdominal aorta is opacified with a bolus injection contrast. Renal arteries are single and widely patent without evidence of hemodynamically significant stenosis.  The aorta itself has diffuse disease but no hemodynamically significant lesions. The common and external iliac arteries are widely patent bilaterally.  The right common femoral is widely patent as is the profunda femoris.  The SFA does indeed have a significant stenosis at Hunter's canal extending into the popliteal artery.  The popliteal demonstrates increasing disease with multiple serial greater than 70% stenoses.  The trifurcation is heavily diseased with occlusion of the anterior tibial within millimeters of its origin, the tibioperoneal trunk demonstrates a greater  than 90% stenosis throughout its course.  The peroneal is the dominant runoff to the foot and appears to be 2-1/2 to 3 mm.  The posterior tibial is occluded at its origin and remains occluded throughout its entirety on the initial imaging.    Following angioplasty and stent placement of the SFA and above-knee popliteal at Hunter's canal there is now less than 10% residual stenosis.  Additional angioplasty of the mid and distal popliteal yields an excellent result with less than 10% residual stenosis.  Laser atherectomy and then angioplasty to 4 mm of the tibioperoneal trunk demonstrates an excellent result with less than 10% residual stenosis and inline flow via the peroneal down to the foot.  Additionally there is now reconstitution of the posterior tibial and its mid to distal one third that fills the plantar arteries quite nicely.  I have discussed with the patient that this now opens up an additional option of treating the posterior tibial with laser atherectomy and angioplasty from a retrograde approach via ankle access if needed.  We will reassess the healing of  her heel wound but have this is an option if needed.    Summary: Successful recanalization right lower extremity for limb salvage.  Following successful revascularization of the SFA/popliteal and tibioperoneal trunk there is now reconstitution of the posterior tibial and its mid to distal one third that fills the plantar arteries quite nicely.  I have discussed with the patient that this now opens up an additional option of treating the posterior tibial with laser atherectomy and angioplasty from a retrograde approach via ankle access if needed.  We will reassess the healing of her heel wound but have this is an option if needed.                             Disposition: Patient was taken to the recovery room in stable condition having tolerated the procedure well.  Earl Lites Sharlee Rufino 11/08/2023,2:20 PM

## 2023-11-08 NOTE — Interval H&P Note (Signed)
 History and Physical Interval Note:  11/08/2023 11:31 AM  [name]  has presented today for surgery, with the diagnosis of RLE Angio   ASO w ulceration.  The various methods of treatment have been discussed with the patient and family. After consideration of risks, benefits and other options for treatment, the patient has consented to  Procedure(s): Lower Extremity Angiography (Right) as a surgical intervention.  The patient's history has been reviewed, patient examined, no change in status, stable for surgery.  I have reviewed the patient's chart and labs.  Questions were answered to the patient's satisfaction.     [name]
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
## 2023-11-09 ENCOUNTER — Encounter (INDEPENDENT_AMBULATORY_CARE_PROVIDER_SITE_OTHER): Payer: Medicare Other

## 2023-11-09 ENCOUNTER — Ambulatory Visit (INDEPENDENT_AMBULATORY_CARE_PROVIDER_SITE_OTHER): Payer: Medicare Other | Admitting: Nurse Practitioner

## 2023-11-09 ENCOUNTER — Encounter: Payer: Self-pay | Admitting: Vascular Surgery

## 2023-11-09 LAB — GLUCOSE, CAPILLARY: Glucose-Capillary: 150 mg/dL — ABNORMAL HIGH (ref 70–99)

## 2023-11-10 NOTE — Anesthesia Postprocedure Evaluation (Signed)
 Anesthesia Post Note  Patient: [name]  Procedure(s) Performed: Lower Extremity Angiography (Right: Leg Lower)  Patient location during evaluation: PACU Anesthesia Type: General Level of consciousness: awake and alert Pain management: pain level controlled Vital Signs Assessment: post-procedure vital signs reviewed and stable Respiratory status: spontaneous breathing, nonlabored ventilation, respiratory function stable and patient connected to nasal cannula oxygen Cardiovascular status: blood pressure returned to baseline and stable Postop Assessment: no apparent nausea or vomiting Anesthetic complications: no   No notable events documented.   Last Vitals:  Vitals:   11/08/23 1600 11/08/23 1615  BP: (!) 149/117 (!) 158/61  Pulse: (!) 55 (!) 56  Resp: 18 20  Temp:    SpO2: 98% 95%    Last Pain:  Vitals:   11/08/23 1135  TempSrc: Oral                 Lenard Simmer

## 2023-11-14 ENCOUNTER — Telehealth (INDEPENDENT_AMBULATORY_CARE_PROVIDER_SITE_OTHER): Payer: Self-pay

## 2023-11-14 NOTE — Telephone Encounter (Signed)
 Jackye called stating that she had a procedure done and the nurse told her to remove the "plug" the next day., but she has never removed it in that soon. She would like to know when to remove it the morning after the procedure.   Per Schnier- Remove in 36 hrs and cover with a bandage

## 2023-11-14 NOTE — Telephone Encounter (Signed)
 Message given

## 2023-11-16 ENCOUNTER — Encounter: Payer: Self-pay | Admitting: Vascular Surgery

## 2023-11-16 ENCOUNTER — Encounter: Admitting: Podiatry

## 2023-11-16 ENCOUNTER — Ambulatory Visit (INDEPENDENT_AMBULATORY_CARE_PROVIDER_SITE_OTHER): Admitting: Podiatry

## 2023-11-16 DIAGNOSIS — M86171 Other acute osteomyelitis, right ankle and foot: Secondary | ICD-10-CM

## 2023-11-16 DIAGNOSIS — L97412 Non-pressure chronic ulcer of right heel and midfoot with fat layer exposed: Secondary | ICD-10-CM

## 2023-11-16 DIAGNOSIS — E11621 Type 2 diabetes mellitus with foot ulcer: Secondary | ICD-10-CM | POA: Diagnosis not present

## 2023-11-17 ENCOUNTER — Telehealth: Payer: Self-pay

## 2023-11-17 NOTE — Telephone Encounter (Signed)
-----   Message from Edwin Cap sent at 11/17/2023  7:42 AM EDT ----- Can you send a new order to [facility] for Adaptic, 4 x 4 gauze, ABD pad and Kerlix.  Change daily.  Moderate drainage.  ICD-10 L97.412.  Date of debridement 10/28/2023 measurements 3.0 x 3.0 x 0.5 cm.  Let me know if you need any more info.  Thank you!

## 2023-11-17 NOTE — Progress Notes (Signed)
  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  I don't have any pain.  [facility] said they need an order or they will not send supplies anymore."     73 y.o. female returns for post-op check.  She underwent angiography last week with Dr. [name]  Review of Systems: Negative except as noted in the HPI. Denies N/V/F/Ch.   Objective:  There were no vitals filed for this visit. There is no height or weight on file to calculate BMI. Constitutional Well developed. Well nourished.  Vascular Foot warm and well perfused. Capillary refill normal to all digits.  Calf is soft and supple, no posterior calf or knee pain, negative Homans' sign  Neurologic Normal speech. Oriented to person, place, and time. Epicritic sensation to light touch grossly absent bilaterally.  Dermatologic Delayed healing and distal portions of toe amputation site with fibrosis.  Graft appears to have good integration.  Heel wound measures 1.8 x 1.5 x 0.2 cm  Orthopedic: Tenderness to palpation noted about the surgical site.     Underwent angiography last week I reviewed the images from the angiogram and has nice flow through the posterior tibial artery which hopefully should be sufficient to heal the heel ulcer, toe amputation site still at risk due to microvascular disease but hopefully has enough collateral through the peroneal  Assessment:   1. Diabetic ulcer of right heel associated with type 2 diabetes mellitus, with fat layer exposed (HCC)   2. Acute osteomyelitis of right ankle or foot (HCC)    Plan:  Patient was evaluated and treated and all questions answered.  S/p foot surgery right -Progressing as expected post-operatively.  Foot was improved with appropriate revascularization now - Okay  for partial weightbearing to forefoot in surgical shoe -Change dressing daily with surgical lubricant to graft site, then nonadherent Adaptic, 4 x 4 gauze ABD pad Kerlix,  -Reorder sent to [facility]  Return in about 2 weeks (around 11/30/2023) for wound care.

## 2023-11-17 NOTE — Telephone Encounter (Signed)
 Order sheet, office note and demographics faxed to [facility] [phone]

## 2023-11-21 ENCOUNTER — Other Ambulatory Visit (INDEPENDENT_AMBULATORY_CARE_PROVIDER_SITE_OTHER): Payer: Self-pay | Admitting: Vascular Surgery

## 2023-11-21 DIAGNOSIS — Z9889 Other specified postprocedural states: Secondary | ICD-10-CM

## 2023-11-23 ENCOUNTER — Encounter (INDEPENDENT_AMBULATORY_CARE_PROVIDER_SITE_OTHER)

## 2023-11-24 ENCOUNTER — Ambulatory Visit (INDEPENDENT_AMBULATORY_CARE_PROVIDER_SITE_OTHER): Admitting: Nurse Practitioner

## 2023-12-05 ENCOUNTER — Ambulatory Visit (INDEPENDENT_AMBULATORY_CARE_PROVIDER_SITE_OTHER): Admitting: Podiatry

## 2023-12-05 ENCOUNTER — Encounter: Payer: Self-pay | Admitting: Podiatry

## 2023-12-05 VITALS — Ht 68.0 in | Wt 150.0 lb

## 2023-12-05 DIAGNOSIS — E11621 Type 2 diabetes mellitus with foot ulcer: Secondary | ICD-10-CM

## 2023-12-05 DIAGNOSIS — L97412 Non-pressure chronic ulcer of right heel and midfoot with fat layer exposed: Secondary | ICD-10-CM | POA: Diagnosis not present

## 2023-12-07 ENCOUNTER — Encounter: Admitting: Podiatry

## 2023-12-07 NOTE — Progress Notes (Signed)
  Subjective:  Patient ID: [name], female    DOB: [birth year],  MRN: [id number]  Chief Complaint  Patient presents with   Wound Check    Patient is here for F/U for wound care of diabetic ulcer of right heel     73 y.o. female returns for post-op check.    Review of Systems: Negative except as noted in the HPI. Denies N/V/F/Ch.   Objective:  There were no vitals filed for this visit. Body mass index is 22.81 kg/m. Constitutional Well developed. Well nourished.  Vascular Foot warm and well perfused. Capillary refill normal to all digits.  Calf is soft and supple, no posterior calf or knee pain, negative Homans' sign  Neurologic Normal speech. Oriented to person, place, and time. Epicritic sensation to light touch grossly absent bilaterally.  Dermatologic Delayed healing and distal portions of toe amputation site with ulceration with exposed subcutaneous tissue measuring 1.8 x 0.6 x 0.8 cm.  Graft appears to have good integration.  Heel wound measures 1.7 x 1.5 x 0.3 cm  Orthopedic: Tenderness to palpation noted about the surgical site.          Assessment:   1. Diabetic ulcer of right heel associated with type 2 diabetes mellitus, with fat layer exposed (HCC)    Plan:  Patient was evaluated and treated and all questions answered.  S/p foot surgery right - Full-thickness excisional debridement was completed to the subcutaneous layer sharply using a scalpel today to remove biofilm slough nonviable tissue and surrounding hyperkeratosis.  Postdebridement measurements are noted above.  Significant delayed healing but no signs of active infection.  Continue local wound care at home. - Okay for partial weightbearing to forefoot in surgical shoe -Change dressing daily with Prisma collagen matrix to toe amputation ulcer and heel ulcer, then nonadherent Adaptic, 4 x 4 gauze ABD pad Kerlix, this was applied today   Return in about 2 weeks (around 12/19/2023) for wound care.

## 2023-12-21 ENCOUNTER — Ambulatory Visit (INDEPENDENT_AMBULATORY_CARE_PROVIDER_SITE_OTHER): Admitting: Podiatry

## 2023-12-21 ENCOUNTER — Encounter: Payer: Self-pay | Admitting: Podiatry

## 2023-12-21 VITALS — Ht 68.0 in | Wt 150.0 lb

## 2023-12-21 DIAGNOSIS — L97412 Non-pressure chronic ulcer of right heel and midfoot with fat layer exposed: Secondary | ICD-10-CM

## 2023-12-21 DIAGNOSIS — E11621 Type 2 diabetes mellitus with foot ulcer: Secondary | ICD-10-CM | POA: Diagnosis not present

## 2023-12-21 DIAGNOSIS — L97522 Non-pressure chronic ulcer of other part of left foot with fat layer exposed: Secondary | ICD-10-CM

## 2023-12-25 NOTE — Progress Notes (Signed)
  Subjective:  Patient ID: [name], female    DOB: 09/15/1950,  MRN: [id number]  Chief Complaint  Patient presents with   Wound Check    Patient is here for diabetic ulcer of right heel F/U- patient has wounds on both feet at this time     73 y.o. female returns for post-op check.    Review of Systems: Negative except as noted in the HPI. Denies N/V/F/Ch.   Objective:  There were no vitals filed for this visit. Body mass index is 22.81 kg/m. Constitutional Well developed. Well nourished.  Vascular Foot warm and well perfused. Capillary refill normal to all digits.  Calf is soft and supple, no posterior calf or knee pain, negative Homans' sign  Neurologic Normal speech. Oriented to person, place, and time. Epicritic sensation to light touch grossly absent bilaterally.  Dermatologic Delayed healing and distal portions of toe amputation site with ulceration with exposed subcutaneous tissue measuring 1.0 x 0.7 x 1.0 cm.    Heel wound measures 1.8 x 1.3 x 0.3 cm.  Left foot full-thickness wound submetatarsal 3 with exposed subcutaneous tissue measuring 0.7 x 0.9 x 2 cm.  Large amount of surrounding hyperkeratosis.  No sign of infection at any site  Orthopedic: Tenderness to palpation noted about the surgical site.               Assessment:   1. Diabetic ulcer of right heel associated with type 2 diabetes mellitus, with fat layer exposed (HCC)   2. 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  S/p foot surgery right - Full-thickness excisional debridement was completed to the subcutaneous layer sharply using a scalpel today of all ulcers noted above to remove biofilm slough nonviable tissue and surrounding hyperkeratosis.  Postdebridement measurements are noted above.  Significant delayed healing but no signs of  active infection.  Continue local wound care at home. - Okay for partial weightbearing to forefoot in surgical shoe -Change dressing daily with Prisma collagen matrix to toe amputation ulcer and heel ulcer, then nonadherent Adaptic, 4 x 4 gauze ABD pad Kerlix, this was applied today - Will discuss with vascular surgery regarding retrograde PTA access   Return in about 3 weeks (around 01/11/2024) for wound care.

## 2023-12-27 ENCOUNTER — Encounter (INDEPENDENT_AMBULATORY_CARE_PROVIDER_SITE_OTHER): Payer: Self-pay

## 2023-12-28 ENCOUNTER — Other Ambulatory Visit: Payer: Self-pay | Admitting: Internal Medicine

## 2023-12-28 DIAGNOSIS — Z1231 Encounter for screening mammogram for malignant neoplasm of breast: Secondary | ICD-10-CM

## 2024-01-11 ENCOUNTER — Ambulatory Visit: Admitting: Podiatry

## 2024-01-18 ENCOUNTER — Ambulatory Visit: Admitting: Podiatry

## 2024-01-30 ENCOUNTER — Ambulatory Visit: Admitting: Podiatry

## 2024-02-01 ENCOUNTER — Encounter: Payer: Self-pay | Admitting: Podiatry

## 2024-02-01 ENCOUNTER — Ambulatory Visit (INDEPENDENT_AMBULATORY_CARE_PROVIDER_SITE_OTHER): Admitting: Podiatry

## 2024-02-01 VITALS — BP 201/85 | HR 81 | Temp 98.1°F

## 2024-02-01 DIAGNOSIS — E11621 Type 2 diabetes mellitus with foot ulcer: Secondary | ICD-10-CM

## 2024-02-01 DIAGNOSIS — L97412 Non-pressure chronic ulcer of right heel and midfoot with fat layer exposed: Secondary | ICD-10-CM

## 2024-02-01 DIAGNOSIS — L97522 Non-pressure chronic ulcer of other part of left foot with fat layer exposed: Secondary | ICD-10-CM

## 2024-02-03 ENCOUNTER — Telehealth (INDEPENDENT_AMBULATORY_CARE_PROVIDER_SITE_OTHER): Payer: Self-pay | Admitting: Vascular Surgery

## 2024-02-03 ENCOUNTER — Telehealth: Payer: Self-pay | Admitting: Podiatry

## 2024-02-03 NOTE — Telephone Encounter (Signed)
 Patient called to let us  know that she talked to April Dr Remi nurse he is in surgery today but that he will call and talk with Dr Silva soon.

## 2024-02-03 NOTE — Progress Notes (Signed)
  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  He needs to check the left one, it has a callus on it.        73 y.o. female returns for post-op check.  Callus has been building up starting to cause some pain.  No abnormal drainage  Review of Systems: Negative except as noted in the HPI. Denies N/V/F/Ch.   Objective:   Vitals:   02/01/24 1620  BP: (!) 201/85  Pulse: 81  Temp: 98.1 F (36.7 C)   There is no height or weight on file to calculate BMI. Constitutional Well developed. Well nourished.  Vascular Foot warm and well perfused. Capillary refill normal to all digits.  Calf is soft and supple, no posterior calf or knee pain, negative Homans' sign  Neurologic Normal speech. Oriented to person, place, and time. Epicritic sensation to light touch grossly absent bilaterally.  Dermatologic Delayed healing and distal portions of toe amputation site with ulceration with exposed subcutaneous tissue measuring 0.8 x 0.8 x 1.2 cm.    Heel wound measures 1.2 x 1.0 x 0.3 cm.  Left foot full-thickness wound submetatarsal 3 with exposed subcutaneous tissue measuring 0.6 x 0.9 x 2 cm.  Large amount of surrounding hyperkeratosis.  No sign of infection at any site  Orthopedic: Tenderness to palpation noted about the surgical site.               Assessment:   1. Diabetic ulcer of right heel associated with type 2 diabetes mellitus, with fat layer exposed (HCC)   2. 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  S/p foot surgery right - Full-thickness excisional debridement was completed to the subcutaneous  layer sharply using a scalpel today of all ulcers noted above to remove biofilm slough nonviable tissue and surrounding hyperkeratosis.  Postdebridement measurements are noted above.  Not much improvement in the toe amputation site or the left foot ulcer but slight improvement in the heel on the right..  Continue local wound care at home. - Okay for partial weightbearing to forefoot in surgical shoe -Change dressing daily with Prisma collagen matrix to toe amputation ulcer and heel ulcer, then nonadherent Adaptic, 4 x 4 gauze ABD pad Kerlix, this was applied today - She will call for follow-up with Dr. Kathryn office   Return in about 3 weeks (around 02/22/2024) for wound care.

## 2024-02-03 NOTE — Telephone Encounter (Signed)
 Called pt 2x to give pt medical records number, fax number, and email. Was unable to lvm due to vm box being full.

## 2024-02-22 ENCOUNTER — Ambulatory Visit (INDEPENDENT_AMBULATORY_CARE_PROVIDER_SITE_OTHER): Admitting: Podiatry

## 2024-02-22 VITALS — Ht 68.0 in | Wt 150.0 lb

## 2024-02-22 DIAGNOSIS — L97412 Non-pressure chronic ulcer of right heel and midfoot with fat layer exposed: Secondary | ICD-10-CM | POA: Diagnosis not present

## 2024-02-22 DIAGNOSIS — E11621 Type 2 diabetes mellitus with foot ulcer: Secondary | ICD-10-CM

## 2024-02-22 DIAGNOSIS — L97522 Non-pressure chronic ulcer of other part of left foot with fat layer exposed: Secondary | ICD-10-CM

## 2024-02-23 NOTE — Progress Notes (Signed)
  Subjective:  Patient ID: [name], female    DOB: 1951/05/02,  MRN: [id number]  Chief Complaint  Patient presents with   Wound Check    Rm 3 Patient is here for wound care. Wound on left foot is open with no drainage and right heel is open, slightly scabbed with no significant drainage.     73 y.o. female returns for post-op check.     Review of Systems: Negative except as noted in the HPI. Denies N/V/F/Ch.   Objective:   There were no vitals filed for this visit.  Body mass index is 22.81 kg/m. Constitutional Well developed. Well nourished.  Vascular Foot warm and well perfused. Capillary refill normal to all digits.  Calf is soft and supple, no posterior calf or knee pain, negative Homans' sign  Neurologic Normal speech. Oriented to person, place, and time. Epicritic sensation to light touch grossly absent bilaterally.  Dermatologic Delayed healing and distal portions of toe amputation site with ulceration with exposed subcutaneous tissue measuring 0.5 x 0.4 x 0.6 cm.    Heel wound measures 1.2 x 0.8 x 0.3 cm.  Left foot full-thickness wound submetatarsal 3 with exposed subcutaneous tissue measuring 0.7 x 0.8 x 3 cm.  Large amount of surrounding hyperkeratosis.  No sign of infection at any site  Orthopedic: Tenderness to palpation noted about the surgical site.                    Assessment:   1. Diabetic ulcer of right heel associated with type 2 diabetes mellitus, with fat layer exposed (HCC)   2. 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  S/p foot surgery right - Full-thickness excisional debridement was completed to the subcutaneous layer sharply using a scalpel today of all ulcers noted above to remove biofilm slough nonviable tissue and surrounding hyperkeratosis.  Postdebridement  measurements are noted above. Improvement on the right foot ulcers today. Continue local wound care at home. - Okay for partial weightbearing to forefoot in surgical shoe -Change dressing daily with collagen matrix to toe amputation ulcer and heel ulcer, then nonadherent Adaptic, 4 x 4 gauze ABD pad Kerlix, this was applied today    Return in about 3 weeks (around 03/14/2024) for wound care.

## 2024-02-24 NOTE — Telephone Encounter (Signed)
 Error

## 2024-02-29 ENCOUNTER — Other Ambulatory Visit (INDEPENDENT_AMBULATORY_CARE_PROVIDER_SITE_OTHER)

## 2024-02-29 DIAGNOSIS — Z9889 Other specified postprocedural states: Secondary | ICD-10-CM | POA: Diagnosis not present

## 2024-02-29 DIAGNOSIS — I739 Peripheral vascular disease, unspecified: Secondary | ICD-10-CM

## 2024-03-05 ENCOUNTER — Ambulatory Visit (INDEPENDENT_AMBULATORY_CARE_PROVIDER_SITE_OTHER): Admitting: Vascular Surgery

## 2024-03-05 LAB — VAS US ABI WITH/WO TBI
Left ABI: 1.08
Right ABI: 1.1

## 2024-03-07 ENCOUNTER — Encounter (INDEPENDENT_AMBULATORY_CARE_PROVIDER_SITE_OTHER)

## 2024-03-07 ENCOUNTER — Ambulatory Visit (INDEPENDENT_AMBULATORY_CARE_PROVIDER_SITE_OTHER): Admitting: Nurse Practitioner

## 2024-03-08 ENCOUNTER — Ambulatory Visit (INDEPENDENT_AMBULATORY_CARE_PROVIDER_SITE_OTHER): Admitting: Vascular Surgery

## 2024-03-14 ENCOUNTER — Ambulatory Visit: Admitting: Podiatry

## 2024-03-14 VITALS — Ht 68.0 in | Wt 150.0 lb

## 2024-03-14 DIAGNOSIS — L97522 Non-pressure chronic ulcer of other part of left foot with fat layer exposed: Secondary | ICD-10-CM | POA: Diagnosis not present

## 2024-03-14 DIAGNOSIS — E11621 Type 2 diabetes mellitus with foot ulcer: Secondary | ICD-10-CM

## 2024-03-14 DIAGNOSIS — L97412 Non-pressure chronic ulcer of right heel and midfoot with fat layer exposed: Secondary | ICD-10-CM

## 2024-03-16 ENCOUNTER — Encounter: Payer: Self-pay | Admitting: Podiatry

## 2024-03-16 NOTE — Progress Notes (Signed)
  Subjective:  Patient ID: [name], female    DOB: 1950/11/23,  MRN: [id number]  Chief Complaint  Patient presents with   Wound Check    Rm 2 Patient is here for wound care of the left and right foot. Ulcers are open with minimum drainage, no redness or swelling.     73 y.o. female returns for post-op check.     Review of Systems: Negative except as noted in the HPI. Denies N/V/F/Ch.   Objective:   There were no vitals filed for this visit.  Body mass index is 22.81 kg/m. Constitutional Well developed. Well nourished.  Vascular Foot warm and well perfused. Capillary refill normal to all digits.  Calf is soft and supple, no posterior calf or knee pain, negative Homans' sign  Neurologic Normal speech. Oriented to person, place, and time. Epicritic sensation to light touch grossly absent bilaterally.  Dermatologic Delayed healing and distal portions of toe amputation site with ulceration with exposed subcutaneous tissue measuring 0.3 x 0.4 x 0.4 cm.    Heel wound measures 0.7 x 0.3 x 0.3 cm.  Left foot full-thickness wound submetatarsal 3 with exposed subcutaneous tissue measuring 0.6 x 0.9 x 3 cm.  Large amount of surrounding hyperkeratosis.  No sign of infection at any site  Orthopedic: Tenderness to palpation noted about the surgical site.              ABIs have improved        Assessment:   1. Diabetic ulcer of right heel associated with type 2 diabetes mellitus, with fat layer exposed (HCC)   2. 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  S/p foot surgery right - Full-thickness excisional debridement was completed to the subcutaneous layer sharply using a scalpel today of all ulcers noted above to remove biofilm slough nonviable tissue and surrounding hyperkeratosis.  Postdebridement measurements  are noted above. Improvement on the right foot ulcers today. Continue local wound care at home. - Okay for partial weightbearing to forefoot in surgical shoe -Change dressing daily with collagen matrix to toe amputation ulcer and heel ulcer, then nonadherent Adaptic, 4 x 4 gauze ABD pad Kerlix, this was applied today    Return in about 3 weeks (around 04/04/2024) for wound care.

## 2024-03-28 ENCOUNTER — Ambulatory Visit: Admitting: Podiatry

## 2024-03-28 ENCOUNTER — Other Ambulatory Visit: Payer: Self-pay | Admitting: Internal Medicine

## 2024-03-28 DIAGNOSIS — R053 Chronic cough: Secondary | ICD-10-CM

## 2024-04-04 ENCOUNTER — Ambulatory Visit (INDEPENDENT_AMBULATORY_CARE_PROVIDER_SITE_OTHER): Admitting: Podiatry

## 2024-04-04 DIAGNOSIS — E11621 Type 2 diabetes mellitus with foot ulcer: Secondary | ICD-10-CM

## 2024-04-04 DIAGNOSIS — L97412 Non-pressure chronic ulcer of right heel and midfoot with fat layer exposed: Secondary | ICD-10-CM | POA: Diagnosis not present

## 2024-04-04 DIAGNOSIS — L97522 Non-pressure chronic ulcer of other part of left foot with fat layer exposed: Secondary | ICD-10-CM

## 2024-04-05 ENCOUNTER — Ambulatory Visit
Admission: RE | Admit: 2024-04-05 | Discharge: 2024-04-05 | Disposition: A | Discharge status: Home / Self Care | Source: Ambulatory Visit | Attending: Internal Medicine | Admitting: Internal Medicine

## 2024-04-05 DIAGNOSIS — R053 Chronic cough: Secondary | ICD-10-CM | POA: Insufficient documentation

## 2024-04-08 NOTE — Progress Notes (Signed)
  Subjective:  Patient ID: [name], female    DOB: 20-Jul-1951,  MRN: [id number]  No chief complaint on file.    73 y.o. female returns for post-op check.     Review of Systems: Negative except as noted in the HPI. Denies N/V/F/Ch.   Objective:   There were no vitals filed for this visit.  There is no height or weight on file to calculate BMI. Constitutional Well developed. Well nourished.  Vascular Foot warm and well perfused. Capillary refill normal to all digits.  Calf is soft and supple, no posterior calf or knee pain, negative Homans' sign  Neurologic Normal speech. Oriented to person, place, and time. Epicritic sensation to light touch grossly absent bilaterally.  Dermatologic Toe amp site healed.    Heel wound measures 0.5 x 0.4 x 0.2 cm.  Left foot full-thickness wound submetatarsal 3 with exposed subcutaneous tissue measuring 0.6 x 1.0 x 3 cm.  Large amount of surrounding hyperkeratosis.  No sign of infection at any site  Orthopedic: Tenderness to palpation noted about the surgical site.               ABIs have improved        Assessment:   1. Diabetic ulcer of right heel associated with type 2 diabetes mellitus, with fat layer exposed (HCC)   2. 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 mellitus, with fat layer exposed, unspecified part of foot (HCC)     Plan:  Patient was evaluated and treated and all questions answered.  S/p foot surgery right - Full-thickness excisional debridement was completed to the subcutaneous layer sharply using a scalpel today of all ulcers noted above to remove biofilm slough nonviable tissue and surrounding hyperkeratosis.  Postdebridement measurements are noted above. Improvement on the right foot ulcers today. Continue local wound care at home. - Okay for partial weightbearing to forefoot in surgical shoe -Change dressing daily with collagen matrix to toe amputation ulcer and heel ulcer, then  nonadherent Adaptic, 4 x 4 gauze ABD pad Kerlix, this was applied today    Return in about 3 weeks (around 04/25/2024) for wound care.

## 2024-04-11 ENCOUNTER — Encounter

## 2024-04-25 ENCOUNTER — Ambulatory Visit (INDEPENDENT_AMBULATORY_CARE_PROVIDER_SITE_OTHER): Admitting: Podiatry

## 2024-04-25 VITALS — Ht 68.0 in | Wt 150.0 lb

## 2024-04-25 DIAGNOSIS — L97412 Non-pressure chronic ulcer of right heel and midfoot with fat layer exposed: Secondary | ICD-10-CM | POA: Diagnosis not present

## 2024-04-25 DIAGNOSIS — E11621 Type 2 diabetes mellitus with foot ulcer: Secondary | ICD-10-CM

## 2024-04-26 ENCOUNTER — Telehealth: Payer: Self-pay | Admitting: Podiatry

## 2024-04-26 ENCOUNTER — Other Ambulatory Visit: Payer: Self-pay | Admitting: Podiatry

## 2024-04-27 MED ORDER — GENTAMICIN SULFATE 0.1 % EX OINT: 1.0000 | TOPICAL_OINTMENT | Freq: Every day | CUTANEOUS | 0 refills | Status: DC

## 2024-04-28 NOTE — Progress Notes (Signed)
  Subjective:  Patient ID: [name], female    DOB: Jul 24, 1951,  MRN: [id number]  Chief Complaint  Patient presents with   Wound Check    Rm 7 Patient is for diabetic ulcer of right heel and the plantar aspect of the right foot. Wounds are open with moderate drainage.     73 y.o. female returns for post-op check.   She has been on it more recently  Review of Systems: Negative except as noted in the HPI. Denies N/V/F/Ch.   Objective:   There were no vitals filed for this visit.  Body mass index is 22.81 kg/m. Constitutional Well developed. Well nourished.  Vascular Foot warm and well perfused. Capillary refill normal to all digits.  Calf is soft and supple, no posterior calf or knee pain, negative Homans' sign  Neurologic Normal speech. Oriented to person, place, and time. Epicritic sensation to light touch grossly absent bilaterally.  Dermatologic Toe amp site healed.    Heel wound measures 1.0 x 1.3 x 0.3 cm.  Left foot full-thickness wound submetatarsal 3 with exposed subcutaneous tissue measuring 0.6 x 1.2 x 0.3 cm.  Large amount of surrounding hyperkeratosis.  No sign of infection at any site  Orthopedic: Tenderness to palpation noted about the surgical site.                  ABIs have improved        Assessment:   1. Diabetic ulcer of right heel associated with type 2 diabetes mellitus, with fat layer exposed (HCC)     Plan:  Patient was evaluated and treated and all questions answered.  S/p foot surgery right - Full-thickness excisional debridement was completed to the subcutaneous layer sharply using a scalpel today of all ulcers noted above to remove biofilm slough nonviable tissue and surrounding hyperkeratosis.  Predebridement measurements were 0.8 x 1.0 for the right foot and 0.5 x 1.0 for the left foot postdebridement measurements are noted above.  Worsening on the right foot ulcers today. Continue local wound care at home. - Okay for  partial weightbearing to forefoot in surgical shoe.  Still advised to rest -Change dressing daily with collagen matrix to toe amputation ulcer and heel ulcer, then nonadherent Adaptic, 4 x 4 gauze ABD pad Kerlix, this was applied today    No follow-ups on file.
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
## 2024-05-01 ENCOUNTER — Other Ambulatory Visit: Payer: Self-pay | Admitting: Podiatry

## 2024-05-01 ENCOUNTER — Ambulatory Visit: Admitting: Cardiovascular Disease

## 2024-05-01 DIAGNOSIS — L97909 Non-pressure chronic ulcer of unspecified part of unspecified lower leg with unspecified severity: Secondary | ICD-10-CM

## 2024-05-01 DIAGNOSIS — I25708 Atherosclerosis of coronary artery bypass graft(s), unspecified, with other forms of angina pectoris: Secondary | ICD-10-CM

## 2024-05-01 DIAGNOSIS — I70213 Atherosclerosis of native arteries of extremities with intermittent claudication, bilateral legs: Secondary | ICD-10-CM

## 2024-05-01 DIAGNOSIS — E1159 Type 2 diabetes mellitus with other circulatory complications: Secondary | ICD-10-CM

## 2024-05-01 DIAGNOSIS — I10 Essential (primary) hypertension: Secondary | ICD-10-CM

## 2024-05-01 DIAGNOSIS — I739 Peripheral vascular disease, unspecified: Secondary | ICD-10-CM

## 2024-05-01 DIAGNOSIS — E785 Hyperlipidemia, unspecified: Secondary | ICD-10-CM

## 2024-05-01 DIAGNOSIS — I480 Paroxysmal atrial fibrillation: Secondary | ICD-10-CM

## 2024-05-03 ENCOUNTER — Telehealth: Payer: Self-pay | Admitting: Podiatry

## 2024-05-03 NOTE — Telephone Encounter (Signed)
 Patient called stating she needs a new RX sent to [facility]  she is out of the gauze. She also would like a RX for ABD pads they said they would probably cover  them.

## 2024-05-07 ENCOUNTER — Ambulatory Visit: Payer: Self-pay | Admitting: Internal Medicine

## 2024-05-07 NOTE — Telephone Encounter (Signed)
 FYI Only or Action Required?: FYI only for provider.  Patient is followed in Pulmonology for N/A, last seen on N/A.  Triage Disposition: Information or Advice Only Call  Patient/caregiver understands and will follow disposition?: Yes            Copied from CRM [id number]. Topic: Clinical - Red Word Triage >> May 07, 2024  3:01 PM Rilla B wrote: [facility] that prompted transfer to Nurse Triage: Shortness of breath, poss fluid in lungs   ----------------------------------------------------------------------- From previous Reason for Contact - Other: Reason for CRM: Reason for Disposition  Health information question, no triage required and triager able to answer question  Answer Assessment - Initial Assessment Questions The pt is wanting to schedule an appt with Dr. Theotis who pt states she saw on 05/14/2022. Upon chart review, this RN let pt know this Dr. Theotis is with [facility] and not [facility]. Pt would like to only see him and requests phone number. This RN provides pt with phone number for Dr. Theotis office that is also at [facility] in [place].  RESPIRATORY STATUS: Describe your breathing? (e.g., wheezing, shortness of breath, unable to speak, severe coughing)      SOB, tired when walk  ONSET: When did this breathing problem begin?      A few months ago; pt states she was living in a home that had black mold in it  PATTERN Does the difficult breathing come and go, or has it been constant since it started?      Comes and goes with movement   SYMPTOMS: Do you have any other symptoms? (e.g., chest pain, cough, dizziness, fever, runny nose)     Cough, intermittent dizziness; denies chest pain; pt states her PCP did a chest x-ray and found out she has fluid in both of her lungs; pt is taking antibiotics for the black mold spores and was also prescribed an inhaler  O2 SATURATION MONITOR:  Do you use an oxygen saturation monitor (pulse oximeter) at  home? If Yes, ask: What is your reading (oxygen level) today? What is your usual oxygen saturation reading? (e.g., 95%)       Not able to  Protocols used: Breathing Difficulty-A-AH, Information Only Call - No Triage-A-AH

## 2024-05-16 ENCOUNTER — Ambulatory Visit: Admitting: Podiatry

## 2024-05-28 ENCOUNTER — Telehealth: Payer: Self-pay | Admitting: Podiatry

## 2024-05-28 NOTE — Telephone Encounter (Signed)
 Is it okay to schedule her on 06/13/2024 for wound care ?

## 2024-06-04 ENCOUNTER — Ambulatory Visit: Admitting: Podiatry

## 2024-06-13 ENCOUNTER — Ambulatory Visit: Admitting: Podiatry

## 2024-06-24 NOTE — Progress Notes (Deleted)
 NO SHOW

## 2024-06-25 ENCOUNTER — Ambulatory Visit: Attending: Cardiovascular Disease | Admitting: Cardiovascular Disease

## 2024-06-25 DIAGNOSIS — I739 Peripheral vascular disease, unspecified: Secondary | ICD-10-CM

## 2024-06-25 DIAGNOSIS — I10 Essential (primary) hypertension: Secondary | ICD-10-CM

## 2024-06-25 DIAGNOSIS — I25708 Atherosclerosis of coronary artery bypass graft(s), unspecified, with other forms of angina pectoris: Secondary | ICD-10-CM

## 2024-06-25 DIAGNOSIS — I480 Paroxysmal atrial fibrillation: Secondary | ICD-10-CM

## 2024-06-25 DIAGNOSIS — R072 Precordial pain: Secondary | ICD-10-CM

## 2024-06-25 DIAGNOSIS — E1159 Type 2 diabetes mellitus with other circulatory complications: Secondary | ICD-10-CM

## 2024-06-25 DIAGNOSIS — E785 Hyperlipidemia, unspecified: Secondary | ICD-10-CM

## 2024-06-25 DIAGNOSIS — E1151 Type 2 diabetes mellitus with diabetic peripheral angiopathy without gangrene: Secondary | ICD-10-CM

## 2024-06-25 DIAGNOSIS — L97909 Non-pressure chronic ulcer of unspecified part of unspecified lower leg with unspecified severity: Secondary | ICD-10-CM

## 2024-06-26 ENCOUNTER — Ambulatory Visit: Attending: Cardiovascular Disease | Admitting: Cardiovascular Disease

## 2024-06-26 VITALS — BP 170/62 | HR 66 | Ht 68.0 in | Wt 126.0 lb

## 2024-06-26 DIAGNOSIS — L97909 Non-pressure chronic ulcer of unspecified part of unspecified lower leg with unspecified severity: Secondary | ICD-10-CM

## 2024-06-26 DIAGNOSIS — I10 Essential (primary) hypertension: Secondary | ICD-10-CM

## 2024-06-26 DIAGNOSIS — R42 Dizziness and giddiness: Secondary | ICD-10-CM

## 2024-06-26 DIAGNOSIS — I70299 Other atherosclerosis of native arteries of extremities, unspecified extremity: Secondary | ICD-10-CM | POA: Diagnosis not present

## 2024-06-26 DIAGNOSIS — I25708 Atherosclerosis of coronary artery bypass graft(s), unspecified, with other forms of angina pectoris: Secondary | ICD-10-CM

## 2024-06-26 DIAGNOSIS — I480 Paroxysmal atrial fibrillation: Secondary | ICD-10-CM

## 2024-06-26 NOTE — Patient Instructions (Addendum)
 Medication Instructions:   Trial hold of the second amlodipine  in the am  Take morning amlodipine  after food Atenolol  in the PM  Track pressures  If you need a refill on your cardiac medications before your next appointment, please call your pharmacy.   Lab work: No new labs needed  Testing/Procedures: Your physician has requested that you have a carotid duplex. This test is an ultrasound of the carotid arteries in your neck. It looks at blood flow through these arteries that supply the brain with blood.   Allow one hour for this exam.  There are no restrictions or special instructions.  This will take place at [address] [facility]) [address], [place] [address]  Please note: We ask at that you not bring children with you during ultrasound (echo/ vascular) testing. Due to room size and safety concerns, children are not allowed in the ultrasound rooms during exams. Our front office staff cannot provide observation of children in our lobby area while testing is being conducted. An adult accompanying a patient to their appointment will only be allowed in the ultrasound room at the discretion of the ultrasound technician under special circumstances. We apologize for any inconvenience.   Follow-Up: At [facility], you and your health needs are our priority.  As part of our continuing mission to provide you with exceptional heart care, we have created designated Provider Care Teams.  These Care Teams include your primary Cardiologist (physician) and Advanced Practice Providers (APPs -  Physician Assistants and Nurse Practitioners) who all work together to provide you with the care you need, when you need it.  You will need a follow up appointment in 6 months  Providers on your designated Care Team:   Lonni Meager, NP Bernardino Bring, PA-C Cadence [place], [place]  COVID-19 Vaccine Information can be found at:  [email or web address] For questions related to vaccine distribution or appointments, please email [email or web address] or call [phone].
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
## 2024-06-27 ENCOUNTER — Ambulatory Visit (INDEPENDENT_AMBULATORY_CARE_PROVIDER_SITE_OTHER): Admitting: Podiatry

## 2024-06-27 VITALS — Ht 68.0 in | Wt 126.0 lb

## 2024-06-27 DIAGNOSIS — E11621 Type 2 diabetes mellitus with foot ulcer: Secondary | ICD-10-CM | POA: Diagnosis not present

## 2024-06-27 DIAGNOSIS — L97412 Non-pressure chronic ulcer of right heel and midfoot with fat layer exposed: Secondary | ICD-10-CM | POA: Diagnosis not present

## 2024-06-27 DIAGNOSIS — L97522 Non-pressure chronic ulcer of other part of left foot with fat layer exposed: Secondary | ICD-10-CM

## 2024-06-28 NOTE — Progress Notes (Addendum)
  Subjective:  Patient ID: [name], female    DOB: 12-31-1950,  MRN: [id number]  Chief Complaint  Patient presents with   Wound Check    RM 4 Patient is here for ulcer of right  and left foot. Wounds are open with moderate drainage.     73 y.o. female returns for post-op check.  She returns for follow-up have not seen her for some time  Review of Systems: Negative except as noted in the HPI. Denies N/V/F/Ch.   Objective:   There were no vitals filed for this visit.  Body mass index is 19.16 kg/m. Constitutional Well developed. Well nourished.  Vascular Foot warm and well perfused. Capillary refill normal to all digits.  Calf is soft and supple, no posterior calf or knee pain, negative Homans' sign  Neurologic Normal speech. Oriented to person, place, and time. Epicritic sensation to light touch grossly absent bilaterally.  Dermatologic Toe amp site healed.    Heel wound measures 1.3 x 1.5 x 0.3 cm.  Left foot full-thickness wound submetatarsal 3 with exposed subcutaneous tissue measuring 0.8 x 1.2 x 0.3 cm.  Large amount of surrounding hyperkeratosis.  No sign of infection at any site  Orthopedic: Tenderness to palpation noted about the surgical site.                  ABIs have improved        Assessment:   1. Diabetic ulcer of right heel associated with type 2 diabetes mellitus, with fat layer exposed (HCC)   2. 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 mellitus, with fat layer exposed, unspecified part of foot (HCC)     Plan:  Patient was evaluated and treated and all questions answered.  S/p foot surgery right - Full-thickness excisional debridement was completed to the subcutaneous layer sharply using a scalpel today of all ulcers noted above to remove biofilm slough nonviable tissue and surrounding hyperkeratosis.  Predebridement measurements were 1.3 x 1.0 for the right foot and 0.5 x 1.0 for the left foot postdebridement  measurements are noted above.  Worsening on the right foot ulcers today. Continue local wound care at home. -Change dressing daily with collagen matrix, ABD pad gauze and tape -Referral for wound care supplies sent to [facility]   Return in about 3 weeks (around 07/18/2024) for wound care.

## 2024-07-13 ENCOUNTER — Ambulatory Visit: Attending: Cardiovascular Disease

## 2024-07-13 DIAGNOSIS — R42 Dizziness and giddiness: Secondary | ICD-10-CM | POA: Diagnosis not present

## 2024-07-16 ENCOUNTER — Telehealth: Payer: Self-pay | Admitting: Podiatry

## 2024-07-16 MED ORDER — CLINDAMYCIN HCL 300 MG PO CAPS: 300.0000 mg | ORAL_CAPSULE | Freq: Three times a day (TID) | ORAL | 0 refills | Status: DC

## 2024-07-16 NOTE — Addendum Note (Signed)
 Addended byBETHA MEDICINE, [place] R on: 07/16/2024 11:33 AM   Modules accepted: Orders

## 2024-07-16 NOTE — Telephone Encounter (Signed)
 Patient reached out with a picture of her toe stating she has an appointment next Wednesday with Dr Silva and was seeing if he wants to see her sooner.. Message sent to Dr Silva per Dr Silva he was sending an antibiotic to pharmacy if it doesn't turn over in 1-2 days patient needs to go to ER. Called patient relayed message from Dr Silva, pt states she understands.

## 2024-07-18 ENCOUNTER — Ambulatory Visit: Payer: Self-pay | Admitting: Cardiovascular Disease

## 2024-07-18 ENCOUNTER — Ambulatory Visit: Admitting: Podiatry

## 2024-07-19 ENCOUNTER — Other Ambulatory Visit: Payer: Self-pay | Admitting: Emergency Medicine

## 2024-07-19 ENCOUNTER — Other Ambulatory Visit: Payer: Self-pay | Admitting: Podiatry

## 2024-07-19 MED ORDER — REPATHA SURECLICK 140 MG/ML ~~LOC~~ SOAJ: 140.0000 mg | SUBCUTANEOUS | 3 refills | Status: AC

## 2024-07-19 MED ORDER — ROSUVASTATIN CALCIUM 40 MG PO TABS: 40.0000 mg | ORAL_TABLET | Freq: Every day | ORAL | 3 refills | Status: AC

## 2024-07-25 ENCOUNTER — Other Ambulatory Visit: Payer: Self-pay | Admitting: Podiatry

## 2024-07-25 ENCOUNTER — Ambulatory Visit: Admitting: Podiatry

## 2024-07-25 VITALS — Ht 68.0 in | Wt 126.0 lb

## 2024-07-25 DIAGNOSIS — L97412 Non-pressure chronic ulcer of right heel and midfoot with fat layer exposed: Secondary | ICD-10-CM

## 2024-07-25 DIAGNOSIS — E11621 Type 2 diabetes mellitus with foot ulcer: Secondary | ICD-10-CM | POA: Diagnosis not present

## 2024-07-26 ENCOUNTER — Telehealth: Payer: Self-pay | Admitting: Podiatry

## 2024-07-26 ENCOUNTER — Other Ambulatory Visit: Payer: Self-pay | Admitting: Podiatry

## 2024-07-26 MED ORDER — DOXYCYCLINE HYCLATE 100 MG PO TABS: 100.0000 mg | ORAL_TABLET | Freq: Two times a day (BID) | ORAL | 0 refills | Status: AC

## 2024-07-26 NOTE — Telephone Encounter (Signed)
 Patient called to request doxycycline ) as soon as possible, patient is almost out of medication Patient contact number, [phone]

## 2024-07-26 NOTE — Telephone Encounter (Signed)
 Patient called asking for RX to be sent to [facility]  for fluffy gauze and the collagen sheets.

## 2024-07-29 NOTE — Progress Notes (Signed)
"  °  Subjective:  Patient ID: [name], female    DOB: [birth year],  MRN: [id number]  Chief Complaint  Patient presents with   Wound Check    RM 1 Patient is here for right heel ulcer. Ulcer is open with moderate drainage. Ulcer of the right 3rd toe is open with moderate drainage. Pt states an ingrown toe nail of the right hallux ( medial).     73 y.o. female returns for post-op check.  She returns for follow-up she has a new sore spot on the second toe  Review of Systems: Negative except as noted in the HPI. Denies N/V/F/Ch.   Objective:   There were no vitals filed for this visit.  Body mass index is 19.16 kg/m. Constitutional Well developed. Well nourished.  Vascular Foot warm and well perfused. Capillary refill normal to all digits.  Calf is soft and supple, no posterior calf or knee pain, negative Homans' sign  Neurologic Normal speech. Oriented to person, place, and time. Epicritic sensation to light touch grossly absent bilaterally.  Dermatologic Toe amp site healed.  New full-thickness ulcer with necrosis of distal tip of right second toe.  Heel wound measures 1.8 x 1.5 x 0.3 cm.  Left foot full-thickness wound submetatarsal 3 with exposed subcutaneous tissue measuring 0.6 x 0.4 x 0.3 cm.  Large amount of surrounding hyperkeratosis.  No sign of infection at any site.     Orthopedic: Tenderness to palpation noted about the surgical site.          ABIs have improved        Assessment:   1. Diabetic ulcer of right heel associated with type 2 diabetes mellitus, with fat layer exposed (HCC)     Plan:  Patient was evaluated and treated and all questions answered.  S/p foot surgery right - Full-thickness excisional debridement was completed to the subcutaneous layer sharply using a scalpel today of all ulcers noted above to remove biofilm slough nonviable tissue and surrounding hyperkeratosis.  Has a new worsening second toe ulceration.  X-rays next visit.   Continue local wound care at home. -Change dressing daily with collagen matrix, ABD pad gauze and tape -Referral for wound care supplies sent to [facility]   No follow-ups on file.  "

## 2024-07-30 LAB — WOUND CULTURE: Organism ID, Bacteria: NONE SEEN

## 2024-07-30 NOTE — Telephone Encounter (Signed)
 Order sent if anything else is needed will request.

## 2024-08-06 ENCOUNTER — Ambulatory Visit: Payer: Self-pay | Admitting: Podiatry

## 2024-08-08 ENCOUNTER — Ambulatory Visit: Admitting: Podiatry

## 2024-08-13 ENCOUNTER — Ambulatory Visit (INDEPENDENT_AMBULATORY_CARE_PROVIDER_SITE_OTHER): Admitting: Podiatry

## 2024-08-13 VITALS — Ht 68.0 in | Wt 126.0 lb

## 2024-08-13 DIAGNOSIS — E11621 Type 2 diabetes mellitus with foot ulcer: Secondary | ICD-10-CM

## 2024-08-13 DIAGNOSIS — M86171 Other acute osteomyelitis, right ankle and foot: Secondary | ICD-10-CM

## 2024-08-13 DIAGNOSIS — L97522 Non-pressure chronic ulcer of other part of left foot with fat layer exposed: Secondary | ICD-10-CM | POA: Diagnosis not present

## 2024-08-13 DIAGNOSIS — L97412 Non-pressure chronic ulcer of right heel and midfoot with fat layer exposed: Secondary | ICD-10-CM | POA: Diagnosis not present

## 2024-08-13 MED ORDER — DOXYCYCLINE HYCLATE 100 MG PO TABS: 100.0000 mg | ORAL_TABLET | Freq: Two times a day (BID) | ORAL | 0 refills | Status: AC

## 2024-08-13 MED ORDER — GENTAMICIN SULFATE 0.1 % EX CREA: TOPICAL_CREAM | Freq: Every day | CUTANEOUS | 0 refills | Status: AC

## 2024-08-13 MED ORDER — LEVOFLOXACIN 750 MG PO TABS: 750.0000 mg | ORAL_TABLET | Freq: Every day | ORAL | 0 refills | Status: AC

## 2024-08-15 NOTE — Progress Notes (Signed)
"  °  Subjective:  Patient ID: [name], female    DOB: 21-Nov-1950,  MRN: [id number]  Chief Complaint  Patient presents with   Wound Check    RM 6 Patient is here for ulcer of the right heel and ight 2nd toe. Wounds are open with visible drainage.Left foot ulcer is scabbed over with no visible drainage.     74 y.o. female returns for post-op check.  She returns for follow-up   Review of Systems: Negative except as noted in the HPI. Denies N/V/F/Ch.   Objective:   There were no vitals filed for this visit.  Body mass index is 19.16 kg/m. Constitutional Well developed. Well nourished.  Vascular Foot warm and well perfused. Capillary refill normal to all digits.  Calf is soft and supple, no posterior calf or knee pain, negative Homans' sign  Neurologic Normal speech. Oriented to person, place, and time. Epicritic sensation to light touch grossly absent bilaterally.  Dermatologic Toe amp site healed.  New full-thickness ulcer with necrosis of distal tip of right second toe.  Heel wound measures 1.8 x 1.5 x 0.3 cm.  Left foot full-thickness wound submetatarsal 3 with exposed subcutaneous tissue measuring 0.6 x 0.4 x 0.3 cm.  Large amount of surrounding hyperkeratosis.  There is still erythema on the right second toe  Orthopedic: Tenderness to palpation noted about the surgical site.             ABIs have improved        Assessment:   1. Diabetic ulcer of right heel associated with type 2 diabetes mellitus, with fat layer exposed (HCC)   2. Acute osteomyelitis of right ankle or foot (HCC)   3. 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 mellitus, with fat layer exposed, unspecified part of foot (HCC)     Plan:  Patient was evaluated and treated and all questions answered.  S/p foot surgery right - Full-thickness excisional debridement was completed to the subcutaneous layer sharply using a scalpel today of all ulcers noted above to remove  biofilm slough nonviable tissue and surrounding hyperkeratosis.   -Discussed broad-spectrum antibiotics for the osteomyelitis of the right second toe doxycycline  and Levaquin  Rx sent to pharmacy.  Toe was debrided to the level of the bone.  Gentamicin  ointment Rx sent to pharmacy for local wound care.  Follow-up in 2 weeks for new x-rays and reevaluation.   No follow-ups on file.  "

## 2024-08-27 ENCOUNTER — Ambulatory Visit (INDEPENDENT_AMBULATORY_CARE_PROVIDER_SITE_OTHER)

## 2024-08-27 ENCOUNTER — Ambulatory Visit (INDEPENDENT_AMBULATORY_CARE_PROVIDER_SITE_OTHER): Admitting: Podiatry

## 2024-08-27 ENCOUNTER — Ambulatory Visit: Admitting: Podiatry

## 2024-08-27 DIAGNOSIS — E11621 Type 2 diabetes mellitus with foot ulcer: Secondary | ICD-10-CM | POA: Diagnosis not present

## 2024-08-27 DIAGNOSIS — M86171 Other acute osteomyelitis, right ankle and foot: Secondary | ICD-10-CM | POA: Diagnosis not present

## 2024-08-27 DIAGNOSIS — L97522 Non-pressure chronic ulcer of other part of left foot with fat layer exposed: Secondary | ICD-10-CM

## 2024-08-27 DIAGNOSIS — L97412 Non-pressure chronic ulcer of right heel and midfoot with fat layer exposed: Secondary | ICD-10-CM | POA: Diagnosis not present

## 2024-08-29 MED ORDER — DEXTROSE 5 % IV SOLN: 1500.0000 mg | INTRAVENOUS | Status: AC

## 2024-08-29 NOTE — Progress Notes (Signed)
"  °  Subjective:  Patient ID: [name], female    DOB: 28-Oct-1950,  MRN: [id number]  Chief Complaint  Patient presents with   Wound Check    Rm 9 Patient is here for diabetic ulcer of the right and left foot. Wounds of the right foot are open with minimum drainage. Left foot wound has small opening ( pen tip size) with no visible drainage.     74 y.o. female returns for post-op check.  She returns for follow-up   Review of Systems: Negative except as noted in the HPI. Denies N/V/F/Ch.   Objective:   There were no vitals filed for this visit.  There is no height or weight on file to calculate BMI. Constitutional Well developed. Well nourished.  Vascular Foot warm and well perfused. Capillary refill normal to all digits.  Calf is soft and supple, no posterior calf or knee pain, negative Homans' sign  Neurologic Normal speech. Oriented to person, place, and time. Epicritic sensation to light touch grossly absent bilaterally.  Dermatologic Toe amp site healed.  New full-thickness ulcer with necrosis of distal tip of right second toe measuring 1.5 x 1.4 x 0.3 cm.  Probes to bone.[name]  Heel wound measures 1.1 x 1.2 x 0.3 cm.  Left foot full-thickness wound submetatarsal 3 is epithelialized.  Large amount of surrounding hyperkeratosis.  There is still erythema on the right second toe  Orthopedic: Tenderness to palpation noted about the surgical site.                  ABIs have improved   New radiographs taken today shows osteolysis of the distal tip of the distal phalanx of the right second toe.  Remainder of right foot x-ray and left foot x-rays are unchanged and/examination with no progressive bony erosions or osteomyelitis     Assessment:   1. Diabetic ulcer of right heel associated with type 2 diabetes mellitus, with fat layer exposed (HCC)   2. 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 mellitus, with fat layer exposed, unspecified part of foot  (HCC)   3. Acute osteomyelitis of toe of right foot (HCC)     Plan:  Patient was evaluated and treated and all questions answered.  S/p foot surgery right - Full-thickness excisional debridement was completed to the subcutaneous layer sharply using a scalpel today of all ulcers noted above to remove biofilm slough nonviable tissue and surrounding hyperkeratosis.   - We reviewed her x-rays and discussed the presence of the left osteomyelitis of the right second toe.  So far oral therapy has failed the other progression of the wound and continues to be somewhat cellulitic.  I recommended further IV antibiotic therapy, we also discussed that definitive treatment likely would be partial amputation of the digit.  She does not want to proceed with this at this time.  Referral placed for dalbavancin infusion.  Follow-up with me in 3 to 4 weeks for ongoing wound care.   No follow-ups on file.  "

## 2024-09-06 ENCOUNTER — Telehealth (HOSPITAL_COMMUNITY): Payer: Self-pay | Admitting: Pharmacist

## 2024-09-06 NOTE — Telephone Encounter (Addendum)
 Called [employer] (representative Macon D) who stated no prior shara is required for dalvance [phone]).  Patient has no deductible. Patient has 20% coinsurance, and has met $25 out of $4200 out of pocket maximum. Provider can buy/bill for this code.  Reference number: [id number]  Patient also have an active [employer] policy. [employer] would cover 80% and [employer] would pick up remaining 20%.  Sent message with above information via secure chat to [facility].

## 2024-09-06 NOTE — Telephone Encounter (Signed)
 Received fax from [facility] home infusion regarding Dalvance referral received from Dr. Giles. [place] expressed financial hardship with Dalvance cost with home infusion  They are requesting BIV from our team and patient copay for [facility] infusion. They will coordinate with patient and if affordable  Dose is Dalvance 1500mg  IV weekly x 2 doses for osteomyelitis  Contact Holley Herring w results: M: [phone]  Sherry Pennant, PharmD, MPH, BCPS, CPP Clinical Pharmacist

## 2024-09-06 NOTE — Telephone Encounter (Signed)
 VM left with Sharlet. Faxed BIV results to [facility] as well. Patient may consider applying for patient assistance. Advised we can assist with this if she chooses our site of care but unsure of turnaround time.  Sherry Pennant, PharmD, MPH, BCPS, CPP Clinical Pharmacist

## 2024-09-06 NOTE — Telephone Encounter (Signed)
 Spoke with Sharlet about medical B/B coverage with 100% coverage once Part B deductible is met.  She states she will contact patient about next steps and preference  Sherry Pennant, PharmD, MPH, BCPS, CPP Clinical Pharmacist

## 2024-09-10 ENCOUNTER — Telehealth (HOSPITAL_COMMUNITY): Payer: Self-pay

## 2024-09-12 ENCOUNTER — Telehealth: Payer: Self-pay | Admitting: Podiatry

## 2024-09-12 NOTE — Telephone Encounter (Signed)
 Patient called states [employer] requires a prior auth for the meds.

## 2024-09-13 ENCOUNTER — Encounter: Payer: Self-pay | Admitting: Podiatry

## 2024-09-19 ENCOUNTER — Ambulatory Visit: Admission: RE | Admit: 2024-09-19

## 2024-09-19 ENCOUNTER — Ambulatory Visit

## 2024-09-24 ENCOUNTER — Ambulatory Visit: Admitting: Podiatry
